# Patient Record
Sex: Male | Born: 1935 | Race: White | Hispanic: No | Marital: Married | Smoking: Never smoker
Health system: Southern US, Community
[De-identification: ages and names within clinical notes are randomized; demographics above are authoritative.]

## PROBLEM LIST (undated history)

## (undated) DIAGNOSIS — L03211 Cellulitis of face: Secondary | ICD-10-CM

## (undated) DIAGNOSIS — Q2112 Patent foramen ovale: Secondary | ICD-10-CM

## (undated) DIAGNOSIS — Z974 Presence of external hearing-aid: Secondary | ICD-10-CM

## (undated) DIAGNOSIS — K449 Diaphragmatic hernia without obstruction or gangrene: Secondary | ICD-10-CM

## (undated) DIAGNOSIS — I639 Cerebral infarction, unspecified: Secondary | ICD-10-CM

## (undated) DIAGNOSIS — K439 Ventral hernia without obstruction or gangrene: Secondary | ICD-10-CM

## (undated) DIAGNOSIS — C801 Malignant (primary) neoplasm, unspecified: Secondary | ICD-10-CM

## (undated) DIAGNOSIS — R3129 Other microscopic hematuria: Secondary | ICD-10-CM

## (undated) DIAGNOSIS — I10 Essential (primary) hypertension: Secondary | ICD-10-CM

## (undated) DIAGNOSIS — N529 Male erectile dysfunction, unspecified: Secondary | ICD-10-CM

## (undated) DIAGNOSIS — Z792 Long term (current) use of antibiotics: Secondary | ICD-10-CM

## (undated) DIAGNOSIS — R972 Elevated prostate specific antigen [PSA]: Secondary | ICD-10-CM

## (undated) DIAGNOSIS — N400 Enlarged prostate without lower urinary tract symptoms: Secondary | ICD-10-CM

## (undated) DIAGNOSIS — N281 Cyst of kidney, acquired: Secondary | ICD-10-CM

## (undated) DIAGNOSIS — I4891 Unspecified atrial fibrillation: Secondary | ICD-10-CM

## (undated) DIAGNOSIS — I69354 Hemiplegia and hemiparesis following cerebral infarction affecting left non-dominant side: Secondary | ICD-10-CM

## (undated) DIAGNOSIS — N139 Obstructive and reflux uropathy, unspecified: Secondary | ICD-10-CM

## (undated) DIAGNOSIS — R269 Unspecified abnormalities of gait and mobility: Secondary | ICD-10-CM

## (undated) DIAGNOSIS — K222 Esophageal obstruction: Secondary | ICD-10-CM

## (undated) DIAGNOSIS — R931 Abnormal findings on diagnostic imaging of heart and coronary circulation: Secondary | ICD-10-CM

## (undated) DIAGNOSIS — K8689 Other specified diseases of pancreas: Secondary | ICD-10-CM

## (undated) DIAGNOSIS — E785 Hyperlipidemia, unspecified: Secondary | ICD-10-CM

## (undated) DIAGNOSIS — Q211 Atrial septal defect: Secondary | ICD-10-CM

## (undated) HISTORY — DX: Atrial septal defect: Q21.1

## (undated) HISTORY — DX: Esophageal obstruction: K22.2

## (undated) HISTORY — DX: Elevated prostate specific antigen (PSA): R97.20

## (undated) HISTORY — DX: Ventral hernia without obstruction or gangrene: K43.9

## (undated) HISTORY — DX: Unspecified abnormalities of gait and mobility: R26.9

## (undated) HISTORY — DX: Cyst of kidney, acquired: N28.1

## (undated) HISTORY — DX: Hyperlipidemia, unspecified: E78.5

## (undated) HISTORY — DX: Presence of external hearing-aid: Z97.4

## (undated) HISTORY — DX: Hemiplegia and hemiparesis following cerebral infarction affecting left non-dominant side: I69.354

## (undated) HISTORY — DX: Other microscopic hematuria: R31.29

## (undated) HISTORY — DX: Other specified diseases of pancreas: K86.89

## (undated) HISTORY — DX: Patent foramen ovale: Q21.12

## (undated) HISTORY — PX: INGUINAL HERNIA REPAIR: SUR1180

## (undated) HISTORY — DX: Diaphragmatic hernia without obstruction or gangrene: K44.9

## (undated) HISTORY — DX: Obstructive and reflux uropathy, unspecified: N13.9

## (undated) HISTORY — DX: Benign prostatic hyperplasia without lower urinary tract symptoms: N40.0

## (undated) HISTORY — DX: Long term (current) use of antibiotics: Z79.2

## (undated) HISTORY — DX: Abnormal findings on diagnostic imaging of heart and coronary circulation: R93.1

## (undated) HISTORY — DX: Essential (primary) hypertension: I10

## (undated) HISTORY — DX: Unspecified atrial fibrillation: I48.91

## (undated) HISTORY — DX: Male erectile dysfunction, unspecified: N52.9

## (undated) HISTORY — DX: Cellulitis of face: L03.211

## (undated) HISTORY — DX: Cerebral infarction, unspecified: I63.9

---
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
## 2003-01-08 ENCOUNTER — Encounter (INDEPENDENT_AMBULATORY_CARE_PROVIDER_SITE_OTHER): Payer: Self-pay | Admitting: Cardiology

## 2003-01-08 ENCOUNTER — Ambulatory Visit (HOSPITAL_COMMUNITY): Admission: RE | Admit: 2003-01-08 | Discharge: 2003-01-08 | Payer: Self-pay | Admitting: Cardiology

## 2003-08-30 ENCOUNTER — Ambulatory Visit (HOSPITAL_COMMUNITY): Admission: RE | Admit: 2003-08-30 | Discharge: 2003-08-30 | Payer: Self-pay | Admitting: Gastroenterology

## 2004-01-28 ENCOUNTER — Encounter (INDEPENDENT_AMBULATORY_CARE_PROVIDER_SITE_OTHER): Payer: Self-pay | Admitting: Cardiology

## 2004-01-28 ENCOUNTER — Ambulatory Visit (HOSPITAL_COMMUNITY): Admission: RE | Admit: 2004-01-28 | Discharge: 2004-01-28 | Payer: Self-pay | Admitting: Cardiology

## 2005-10-26 ENCOUNTER — Emergency Department (HOSPITAL_COMMUNITY): Admission: EM | Admit: 2005-10-26 | Discharge: 2005-10-26 | Payer: Self-pay | Admitting: Family Medicine

## 2006-01-09 DIAGNOSIS — K8689 Other specified diseases of pancreas: Secondary | ICD-10-CM

## 2006-01-09 DIAGNOSIS — N281 Cyst of kidney, acquired: Secondary | ICD-10-CM

## 2006-01-09 HISTORY — DX: Other specified diseases of pancreas: K86.89

## 2006-01-09 HISTORY — DX: Cyst of kidney, acquired: N28.1

## 2006-01-09 HISTORY — PX: MITRAL VALVE REPAIR: SHX2039

## 2006-02-03 ENCOUNTER — Emergency Department (HOSPITAL_COMMUNITY): Admission: EM | Admit: 2006-02-03 | Discharge: 2006-02-03 | Payer: Self-pay | Admitting: Family Medicine

## 2006-02-07 ENCOUNTER — Inpatient Hospital Stay (HOSPITAL_COMMUNITY): Admission: EM | Admit: 2006-02-07 | Discharge: 2006-02-17 | Payer: Self-pay | Admitting: Family Medicine

## 2006-02-08 ENCOUNTER — Encounter (INDEPENDENT_AMBULATORY_CARE_PROVIDER_SITE_OTHER): Payer: Self-pay | Admitting: *Deleted

## 2006-02-09 ENCOUNTER — Encounter: Payer: Self-pay | Admitting: Vascular Surgery

## 2006-02-11 ENCOUNTER — Encounter (INDEPENDENT_AMBULATORY_CARE_PROVIDER_SITE_OTHER): Payer: Self-pay | Admitting: *Deleted

## 2006-03-10 ENCOUNTER — Encounter (HOSPITAL_COMMUNITY): Admission: RE | Admit: 2006-03-10 | Discharge: 2006-06-08 | Payer: Self-pay | Admitting: Cardiology

## 2006-03-13 ENCOUNTER — Emergency Department (HOSPITAL_COMMUNITY): Admission: EM | Admit: 2006-03-13 | Discharge: 2006-03-13 | Payer: Self-pay | Admitting: Emergency Medicine

## 2006-03-21 ENCOUNTER — Encounter
Admission: RE | Admit: 2006-03-21 | Discharge: 2006-03-21 | Payer: Self-pay | Admitting: Thoracic Surgery (Cardiothoracic Vascular Surgery)

## 2006-05-09 ENCOUNTER — Encounter: Admission: RE | Admit: 2006-05-09 | Discharge: 2006-05-09 | Payer: Self-pay | Admitting: Gastroenterology

## 2006-06-09 ENCOUNTER — Encounter (HOSPITAL_COMMUNITY): Admission: RE | Admit: 2006-06-09 | Discharge: 2006-06-15 | Payer: Self-pay | Admitting: Cardiology

## 2006-07-12 DIAGNOSIS — K222 Esophageal obstruction: Secondary | ICD-10-CM

## 2006-07-12 HISTORY — DX: Esophageal obstruction: K22.2

## 2006-08-29 ENCOUNTER — Ambulatory Visit: Payer: Self-pay | Admitting: Thoracic Surgery (Cardiothoracic Vascular Surgery)

## 2007-02-27 ENCOUNTER — Ambulatory Visit: Payer: Self-pay | Admitting: Thoracic Surgery (Cardiothoracic Vascular Surgery)

## 2007-04-07 ENCOUNTER — Encounter: Admission: RE | Admit: 2007-04-07 | Discharge: 2007-04-07 | Payer: Self-pay | Admitting: Gastroenterology

## 2007-04-07 ENCOUNTER — Ambulatory Visit: Payer: Self-pay | Admitting: Family Medicine

## 2007-05-02 ENCOUNTER — Ambulatory Visit: Payer: Self-pay | Admitting: Gastroenterology

## 2007-05-04 ENCOUNTER — Ambulatory Visit (HOSPITAL_COMMUNITY): Admission: RE | Admit: 2007-05-04 | Discharge: 2007-05-04 | Payer: Self-pay | Admitting: Gastroenterology

## 2007-05-04 ENCOUNTER — Encounter: Payer: Self-pay | Admitting: Gastroenterology

## 2007-05-12 ENCOUNTER — Ambulatory Visit: Payer: Self-pay | Admitting: Gastroenterology

## 2007-05-13 DIAGNOSIS — L03211 Cellulitis of face: Secondary | ICD-10-CM

## 2007-05-13 HISTORY — DX: Cellulitis of face: L03.211

## 2007-05-16 ENCOUNTER — Ambulatory Visit: Payer: Self-pay | Admitting: Family Medicine

## 2007-09-13 DIAGNOSIS — K5900 Constipation, unspecified: Secondary | ICD-10-CM | POA: Insufficient documentation

## 2007-09-13 DIAGNOSIS — I517 Cardiomegaly: Secondary | ICD-10-CM

## 2007-09-13 DIAGNOSIS — I493 Ventricular premature depolarization: Secondary | ICD-10-CM | POA: Insufficient documentation

## 2007-09-13 DIAGNOSIS — R011 Cardiac murmur, unspecified: Secondary | ICD-10-CM

## 2007-09-13 DIAGNOSIS — R5383 Other fatigue: Secondary | ICD-10-CM

## 2007-09-13 DIAGNOSIS — K5730 Diverticulosis of large intestine without perforation or abscess without bleeding: Secondary | ICD-10-CM | POA: Insufficient documentation

## 2007-09-13 DIAGNOSIS — R5381 Other malaise: Secondary | ICD-10-CM | POA: Insufficient documentation

## 2007-09-13 DIAGNOSIS — K863 Pseudocyst of pancreas: Secondary | ICD-10-CM

## 2007-09-13 DIAGNOSIS — I10 Essential (primary) hypertension: Secondary | ICD-10-CM | POA: Insufficient documentation

## 2007-09-13 DIAGNOSIS — N400 Enlarged prostate without lower urinary tract symptoms: Secondary | ICD-10-CM

## 2007-09-13 DIAGNOSIS — N281 Cyst of kidney, acquired: Secondary | ICD-10-CM | POA: Insufficient documentation

## 2007-09-13 DIAGNOSIS — K862 Cyst of pancreas: Secondary | ICD-10-CM | POA: Insufficient documentation

## 2007-09-13 DIAGNOSIS — K449 Diaphragmatic hernia without obstruction or gangrene: Secondary | ICD-10-CM | POA: Insufficient documentation

## 2007-12-12 ENCOUNTER — Encounter (INDEPENDENT_AMBULATORY_CARE_PROVIDER_SITE_OTHER): Payer: Self-pay | Admitting: *Deleted

## 2007-12-12 ENCOUNTER — Inpatient Hospital Stay (HOSPITAL_COMMUNITY): Admission: EM | Admit: 2007-12-12 | Discharge: 2007-12-15 | Payer: Self-pay | Admitting: Emergency Medicine

## 2007-12-15 ENCOUNTER — Encounter (INDEPENDENT_AMBULATORY_CARE_PROVIDER_SITE_OTHER): Payer: Self-pay | Admitting: *Deleted

## 2007-12-25 ENCOUNTER — Ambulatory Visit (HOSPITAL_COMMUNITY): Admission: RE | Admit: 2007-12-25 | Discharge: 2007-12-25 | Payer: Self-pay | Admitting: Cardiology

## 2008-05-13 ENCOUNTER — Ambulatory Visit: Payer: Self-pay | Admitting: Family Medicine

## 2009-04-11 ENCOUNTER — Ambulatory Visit: Payer: Self-pay | Admitting: Family Medicine

## 2010-01-02 ENCOUNTER — Ambulatory Visit: Payer: Self-pay | Admitting: Family Medicine

## 2010-02-09 HISTORY — PX: CARDIAC CATHETERIZATION: SHX172

## 2010-02-11 DIAGNOSIS — R931 Abnormal findings on diagnostic imaging of heart and coronary circulation: Secondary | ICD-10-CM

## 2010-02-11 HISTORY — DX: Abnormal findings on diagnostic imaging of heart and coronary circulation: R93.1

## 2010-05-29 ENCOUNTER — Ambulatory Visit: Payer: Self-pay | Admitting: Family Medicine

## 2010-07-12 DIAGNOSIS — N139 Obstructive and reflux uropathy, unspecified: Secondary | ICD-10-CM

## 2010-07-12 DIAGNOSIS — N400 Enlarged prostate without lower urinary tract symptoms: Secondary | ICD-10-CM

## 2010-07-12 DIAGNOSIS — R972 Elevated prostate specific antigen [PSA]: Secondary | ICD-10-CM

## 2010-07-12 HISTORY — DX: Benign prostatic hyperplasia without lower urinary tract symptoms: N40.0

## 2010-07-12 HISTORY — DX: Elevated prostate specific antigen (PSA): R97.20

## 2010-07-12 HISTORY — DX: Obstructive and reflux uropathy, unspecified: N13.9

## 2010-07-22 ENCOUNTER — Inpatient Hospital Stay: Admission: RE | Admit: 2010-07-22 | Payer: Self-pay | Source: Home / Self Care | Admitting: General Surgery

## 2010-11-24 NOTE — Consult Note (Signed)
Jack, [name] NO.:  [id number]   MEDICAL RECORD NO.:  [id number]          PATIENT TYPE:  INP   LOCATION:  3035                         FACILITY:  [facility]   PHYSICIAN:  Levert Feinstein, MD          DATE OF BIRTH:  07-29-1933   DATE OF CONSULTATION:  12/13/2007  DATE OF DISCHARGE:                                 CONSULTATION   CHIEF COMPLAINT:  Acute stroke.   HISTORY OF PRESENT ILLNESS:  The patient is a 77 year old gentleman with  previous history of mitral valve prolapse with mitral valve  regurgitation who has underwent mitral valve replacement and also  tricuspid valve annuloplasty, closure of the patent foramen ovale, and  Maze procedure on August 2007.  He has also had a past medical history  of hypertension. He was treated with coumadin 6 months postsurgically,  but aspirin afterwards.  He went on a hiking trip with his visiting  grandchildren December 11, 2007, and around 5 o'clock, while drinking soda,  he developed choking episode, followed by slurred speech, but he denies  left upper extremity weakness.  Also, he was found to have elevated  systolic blood pressure at 180s, which has prompted ER visit.  MRI  showed acute infarction involving the right frontal motor strip.  Since  admission, his symptom has much improved.  The family also said it is  almost back to normal.   REVIEW OF SYSTEMS:  He denies vision change, headache, chest pain,  shortness of breath, and heart palpitation.   PAST MEDICAL HISTORY:  Hypertension, paroxysmal atrial fibrillation,  mitral valve regurgitation, tricuspid valve regurgitation, patent  foramen ovale, status post closure; intermitten A-fib.   PAST SURGICAL HISTORY:  Mitral valve repair, tricuspid valve  annuloplasty, and patent foramen ovale closure, Maze procedure.   CURRENT MEDICATIONS:  1. Aspirin 325 mg every day.  2. Lovenox subcu as DVT prophylaxis.   FAMILY HISTORY:  Noncontributory.   SOCIAL HISTORY:  Denies  smoke or drink.  He lives with his family.   ALLERGIES:  No known drug allergy.   PHYSICAL EXAMINATION:  VITAL SIGNS:  Temperature 97.8, blood pressure  130/70, and heart rate of 78.  CARDIAC:  Regular rate rhythm.  PULMONARY:  Clear to auscultation bilaterally.  NECK:  Supple.  No carotid bruits.  NEUROLOGIC:  Mental status, alert, oriented to history, casual  conversation.  There was mild hesitation in his speech, but there was no  aphasia, not slurred.  Cranial nerves II through XII: pupil equal, round, reactive to light and  accommodation.  Visual field were full on confrontational test.  Fundi  was sharp bilaterally.  facial sensation was normal.  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  Uvula and tongue are  midline.  Shoulder shrug and head turn normal, symmetric, and he is hard  of hearing.  Motor examination:  Normal tone and bulk.  There was mild fixation of  the left upper extremity on rapid movement and there was also mild  weakness.  Left hand grip 5-/5.  Sensory  examination was normal to light touch pin prick.  Deep tendon  reflex was normal symmetric.  Plantar responses were flexor.  Gait,  narrow based and steady.  Coordination:  Normal finger-to-nose, heel-to-  shin.   MRI of the brain, there was acute stroke on the right MCA motor strip on  the DWI.  MRA, there was mild attenuation on the right MCA, M2 and M3  distribution.   ASSESSMENT/PLAN:  A 77 year old with past medical history of mitral  valve repair, tricuspid valve annuloplasty, closure of patent foramen  ovale, and Maze procedure presenting with acute slurred speech and mild  dysphasia.  MRI confirmed right MCA motor strip acute stroke.  Etiology  might suggest a small embolic versus atherosclerotic disease.  With  normal TTE, I would suggest proceed with TEE to further evaulate, in  light of his cardiac history.  If there is no evidence of embolic  resoures, he should continue to  benefit antiplatelet agent, will switch  him to Plavix 75 mg.  If TEE has findings suggestive of embolic  resoures, he will then benefit long term anticoagulation with coumadin  with INR 2-3.  Please continue to address risk factors.  Blood pressure  needs to be less than or equal to 130/80, LDL less than 100.      Levert Feinstein, MD  Electronically Signed     YY/MEDQ  D:  12/13/2007  T:  12/14/2007  Job:  [id number]

## 2010-11-24 NOTE — Assessment & Plan Note (Signed)
[facility]                         GASTROENTEROLOGY OFFICE NOTE   LEON, MONTOYA                        MRN:          [id number]  DATE:05/02/2007                            DOB:          [birth year]    REFERRING PHYSICIAN:  Bernette Redbird, M.D.   PRIMARY CARE PHYSICIAN:  Dr. Sharlot Gowda.   REASON FOR REFERRAL:  Dr. Matthias Hughs asked me to evaluate Mr. Zaman in  consultation regarding pancreatic cysts.   HISTORY OF PRESENT ILLNESS:  Mr. Luckey is a very pleasant 77 year old  man who was initially noted to have a 2 cm cystic lesion in his  pancreatic neck in June of 2007.  This was done as part of a workup for  microhematuria.  He had CA 19-9 done around that time and the result was  37, which is 2 ticks above normal.  He had followup CT scans twice.  The  most recent was 1 month ago and it showed that this lesion was  essentially stable measuring 1.9 cm.  Repeat CA 19-9 done last month  showed that it is slightly elevated now to 59.   He has no history of anything that sounds like acute pancreatitis.  He  has never bene severely injured in his abdomen.  Never was a major  alcohol drinker and pancreatic diseases do not run in his family.   REVIEW OF SYSTEMS:  Notable for stable weight.  Otherwise, essentially  normal and is available on his nursing intake sheet.   PAST MEDICAL HISTORY:  Hypertension, status post heart valve surgery,  status post hernia surgery.  This is a mitral and tricuspid  valvuloplasty.   CURRENT MEDICATIONS:  Diovan.  Baby aspirin.   ALLERGIES:  No known drug allergies.   SOCIAL HISTORY:  Married with 3 sons.  Nonsmoker.  Nondrinker.   FAMILY HISTORY:  No colon cancer, colon polyps, or pancreatic cancer in  family.   PHYSICAL EXAM:  Height 5 feet 4 inches, 139 pounds, blood pressure  122/72, pulse 80.  CONSTITUTIONAL:  Generally well-appearing.  NEUROLOGIC:  Alert and oriented x3.  EYES:  Extraocular movements  intact.  MOUTH:  Oropharynx moist, no lesions.  NECK:  Supple.  No lymphadenopathy.  CARDIOVASCULAR:  Heart is regular rate and rhythm.  LUNGS:  Clear to auscultation bilaterally.  ABDOMEN:  Soft, nontender, and nondistended.  Normal bowel sounds.  EXTREMITIES:  No lower extremity edema.  SKIN:  No rash or lesions on the visible extremities.   ASSESSMENT AND PLAN:  A 77 year old man with incidental pancreatic cyst  in the neck of his pancreas that has shown no significant growth in  about a year-and-a-half on serial imaging.   It is highly unlikely that this cyst represents pancreatic cancer.  At  worst, it may be a pre-cancerous condition, such as a mucinous cyst in  his pancreas.  More likely, given its imaging characteristics, it is a  serous cyst which is thought to be benign and have no malignant  potential.  He does have slightly elevated CA 19-9, but this is very  nonspecific.  I offered  followup CT scan in 6 months as 1 option.  I did  recommend that the best option would be to proceed with endoscopic  ultrasound with fine needle aspiration.  Cyst fluid analysis can risk-  stratify the cyst a bit more to potentially being a premalignant  condition.  If it is found to be a premalignant cyst, I think he would  need to be arranged to see an experienced pancreatic surgeon to discuss  options at that point.  If, however, the cyst has serous characteristics  on analysis, then perhaps 1 more CT scan in about a year and if there is  still no change at that point, I think foregoing any further followup is  completely reasonable.  Mr. Kagel and his wife agreed that they want to  proceed with endoscopic ultrasound with fine needle aspiration.  We  will, therefore, arrange for that to be done at his soonest convenience.     Rachael Fee, MD  Electronically Signed    DPJ/MedQ  DD: 05/02/2007  DT: 05/03/2007  Job #: [id number]   cc:   Bernette Redbird, M.D.  Sharlot Gowda, M.D.

## 2010-11-24 NOTE — Assessment & Plan Note (Signed)
OFFICE VISIT   Jack [name], Jack [name]  DOB:  May 16, 1934                                        February 27, 2007  CHART #:  [id number]   HISTORY OF PRESENT ILLNESS:  The patient returns for routine followup  now more than one year status post mitral valve repair, tricuspid valve  annuloplasty, closure of patent foramen ovale and a MAZE procedure on  February 11, 2006. He was last seen in the office on August 29, 2006.  Since then, he has continued to do quite well. He denies any exertional  shortness of breath or chest discomfort. He denies any lower extremity  edema. He has not had any palpitations or dizzy spells. He continues to  see Dr.  Donnie Aho on a regular basis and he has now been taken off of  Coumadin for several months. Overall, he feels well.   REVIEW OF SYSTEMS:  Is entirely uneventful.   PAST MEDICAL HISTORY:  Is unchanged from previously.   CURRENT MEDICATIONS:  1. Diovan/hydrochlorothiazide one tablet daily.  2. Aspirin 81 mg daily.   PHYSICAL EXAMINATION:  Notable for a well-appearing male. Blood pressure  133/73, pulse 78 and regular. Two channel telemetry rhythm strip  demonstrates normal sinus rhythm.  HEENT: Examination is unrevealing.  Auscultation of the chest demonstrates clear breath sounds which are  symmetrical bilaterally.  CARDIOVASCULAR: Regular rate and rhythm. No murmurs, rubs or gallops are  noted.  ABDOMEN: Soft and nontender.  EXTREMITIES: Are warm and well perfused. There is no lower extremity  edema.  The remainder of his physical examination is noncontributory.   IMPRESSION:  The patient appears to be doing quite well now more than  one year status post mitral valve repair, tricuspid annuloplasty,  closure of patent foramen ovale and a MAZE procedure. He appears to be  maintaining sinus rhythm. He has been taken off of Coumadin and he  continues to do quite well.   PLAN:  We will ask the patient to return for followup in  one year to see  how he is doing and check his heart rhythm. He will continue to followup  closely with Dr.  Donnie Aho for longterm attention to his basic medical  needs.   Salvatore Decent. Cornelius Moras, M.D.  Electronically Signed   CHO/MEDQ  D:  02/27/2007  T:  02/27/2007  Job:  [id number]   cc:   Georga Hacking, M.D.

## 2010-11-24 NOTE — H&P (Signed)
NAME:  Jack [name], Jack [name] NO.:  [id number]   MEDICAL RECORD NO.:  [id number]          PATIENT TYPE:  EMS   LOCATION:  MAJO                         FACILITY:  [facility]   PHYSICIAN:  Mobolaji B. Bakare, M.D.DATE OF BIRTH:  22-Nov-1933   DATE OF ADMISSION:  12/11/2007  DATE OF DISCHARGE:                              HISTORY & PHYSICAL   PRIMARY CARE PHYSICIAN:  Sharlot Gowda, M.D.   CARDIOLOGIST:  Georga Hacking, M.D.   CHIEF COMPLAINT:  Slurred speech about 50 p.m. yesterday.   HISTORY OF PRESENTING COMPLAINT:  Mr. Ikard is a 77 year old Caucasian  male with history of hypertension, transient atrial fibrillation in July  2007, mitral valve repair, tricuspid valve annuloplasty, closure of  patent foramen ovale and maze procedure procedure in August 2007.   He was in his usual state of health until yesterday.  The patient went  hiking with his visiting grandchildren.  They went to the mountains.  The patient hiked for several miles.  About 5 o'clock when he was  resting, he developed slurred speech but no shortness of breath.  He  decided to head home.  They went for dinner upon arrival in [place].  The patient had his dinner without choking.  He got home about 10 p.m.  He checked his blood pressure.  It was elevated with a systolic of 180.  He subsequently developed facial asymmetry.  Hence, the patient's family  called EMS and the patient was brought to the emergency room.  He was  seen at 2320 hours, more than 6 hours after onset of symptoms.  The  patient was not a candidate for t-PA.  He had a CT scan of the head  which showed probable subacute nonhemorrhagic stroke in the left  posterior cerebral artery distribution.  His EKG showed sinus rhythm  with first-degree AV block and PVCs.  The patient is awake and able to  give history.  The patient has no changes in his vision.   REVIEW OF SYSTEMS:  He denies cough, chills, shortness of breath,  orthopnea or PND.   No palpitation.  No nausea, vomiting, diarrhea or  chest pain.  No abdominal pain, constipation.  He denies dysuria or  urgency or increased frequency of micturition.   PAST MEDICAL HISTORY:  As listed in the HPI:   1. Hypertension.  2. Paroxysmal atrial fibrillation.  3. Mitral valve regurgitation, status post repair in August 2007.  4. Tricuspid valve regurgitation, status post annuloplasty.  5. Patent foramen ovale, status post closure and maze procedure.   CURRENT MEDICATIONS:  1. Diovan/hydrochlorothiazide 1 tablet daily.  2. Aspirin 81 mg daily.  3. Viagra.   ALLERGIES:  No known drug allergies.   FAMILY HISTORY:  Positive for heart disease and colon cancer.   SOCIAL HISTORY:  The patient does not smoke cigarettes nor drink  alcohol.  He is retired from [employer].   EXAMINATION:  INITIAL VITALS:  Temperature 96.3, blood pressure 168/75.  Current blood pressure rechecked was 123/66, pulse of 74-80, respiratory  rate of 18, O2 saturation of 99%.  The patient is awake, alert, oriented in time, place and person.  Normocephalic, atraumatic head.  Pupils equal, round and reactive to  light.  Extraocular muscle movement intact.  He has facial asymmetry  with subtle left facial droop.  No elevated JVD.  No carotid bruit.  LUNGS:  Clear clinically to auscultation except for minimal bibasilar  rales.  CVS:  S1, S2.  Regular.  No murmur.  ABDOMEN:  Not distended, soft, nontender.  Bowel sounds present.  EXTREMITIES:  No pedal edema or calf tenderness.  Dorsalis pedis pulses  palpable bilaterally.  There is no distal cyanosis.  CNS:  Pupils are equal, round and reactive to light.  Extraocular muscle  movement intact.  Uvula is central.  Tongue is deviating to the left,  and he has slight facial asymmetry with left facial droop.  Muscle power  5/5 in all limbs.  Deep tendon reflexes equal and symmetrical.  Sensation is intact.  Plantar reflex upgoing on the left and  downgoing  on the right.   INITIAL LABORATORY DATA:  White cells 7.1, hemoglobin 12.8, hematocrit  37.4, platelets 287.  Sodium 137, potassium 3.4, chloride 99, glucose  99, BUN 16, creatinine 1.2.  Head CT scan showed probable subacute  nonhemorrhagic stroke in the left posterior cerebral artery  distribution, mild chronic small-vessel ischemic changes, no acute  intracranial abnormality.  EKG showed sinus rhythm with first-degree AV  block with occasional premature ventricular complexes.   ASSESSMENT AND PLAN:  1. Mr. Musick is a 77 year old Caucasian male with past medical      history as detailed above.  He is presenting with slurred speech      and facial asymmetry, which symptoms started about 5 p.m.      yesterday.  He is not a candidate for t-PA given the duration of      symptoms.  CT scan of the head showed subacute stroke in the left      posterior cerebral artery distribution.  The patient will be      admitted for further evaluation and treatment.  We will admit to      telemetry floor.  We will do swallowing screen.  Meanwhile, he will      be given aspirin 300 mg rectally.  We will initiate stroke workup      to include carotid Dopplers, 2-D echocardiogram, fasting lipid      profile, MRI and MRA of the head and neck, homocysteine level,      hemoglobin A1c, transcranial Dopplers.  We will hold all      antihypertensives for now given his current blood pressure, PT, OT      and speech evaluation.  We will consult neurology in the morning.  2. History of paroxysmal atrial fibrillation.  The patient is      currently in normal sinus rhythm.  3. Hypokalemia.  We will replete with potassium supplements in IV      fluid.      Mobolaji B. Corky Downs, M.D.  Electronically Signed    MBB/MEDQ  D:  12/12/2007  T:  12/12/2007  Job:  [id number]   cc:   Sharlot Gowda, M.D.  Georga Hacking, M.D.

## 2010-11-27 NOTE — H&P (Signed)
NAMEBAXTER, GONZALEZ NO.:  [id number]   MEDICAL RECORD NO.:  [id number]          PATIENT TYPE:  INP   LOCATION:  1828                         FACILITY:  [facility]   PHYSICIAN:  Elmore Guise., M.D.DATE OF BIRTH:  21-Nov-1933   DATE OF ADMISSION:  02/07/2006  DATE OF DISCHARGE:                                HISTORY & PHYSICAL   INDICATION FOR ADMISSION:  New-onset atrial fibrillation.   PRIMARY CARDIOLOGIST:  Dr. Viann Fish.   HISTORY OF PRESENT ILLNESS:  The patient is very pleasant 77 year old white  male with past medical history of hypertension and mitral valve disease  (history of moderate to severe mitral regurgitation with partially flail  anterior lateral mitral valve leaflet) who presented with 1-week history of  increased dyspnea on exertion, malaise, and decreased exertional tolerance.  The patient denies any chest pain or palpitation.  He went to a local [facility], there was found to be volume overloaded in new-onset atrial  fibrillation with rate in the 140-150 range.  He was then sent to [facility]  hospital for further evaluation and treatment.  The patient reports mild  swelling and orthopnea over the last week.  Otherwise states I have been  doing okay.  He denies any fever, chills, nausea, vomiting or diarrhea.  No  presyncope, syncope and no palpitations.   CURRENT MEDICATIONS:  1.  Diovan HCTZ 160/12.5 mg daily.  2.  Aspirin once daily.  3.  Norvasc 5 mg daily.   ALLERGIES:  None.   FAMILY HISTORY:  Positive for heart disease and colon cancer.   SOCIAL HISTORY:  He is married.  No tobacco or alcohol.  Has three grown  children.  He is retired from [employer].   EXAM:  He is afebrile.  Blood pressure is 121/80, heart rate 100-110, in  atrial fibrillation, respiratory rate is 16, sat 95%.  GENERAL:  He is a very pleasant white male alert and oriented x4 in no acute  distress.  He does have mild JVD to the angle of the jaw sitting  at 30  degrees angle.  He has no bruits.  LUNGS:  Have bibasilar crackles.  HEART:  Irregular regular with 3/6 holosystolic murmur noted.  ABDOMEN:  Soft, nontender, mild distension with tympanitic sounds in the  upper abdomen.  EXTREMITIES:  Showed trace pretibial edema.   LAB WORK:  Shows normal coags with an INR of 1.3 and PTT of 32, BNP is 575,  BUN is 15, creatinine is pending at the time of dictation.  Potassium 3.8,  hemoglobin 7.41, white count of 7.7 and platelet count 264.  His myoglobin  is 60 and MB of 1.5, troponin I less than 0.05.  ECG shows atrial  fibrillation with rapid ventricular response of 141 beats per minute no  significant ST or T-wave changes.  Chest x-ray showed mild volume overload.  His echo was reviewed from 01/17/2006 showed an EF of 70% with moderate  severe mitral regurgitation and near flail anterior mitral valve leaflet.  His left atrial dimension was 50 mm.   IMPRESSION:  1.  New-onset atrial fibrillation.  2.  History of moderate to severe mitral regurgitation.   PLAN:  1.  The patient will be admitted to telemetry.  Will start amiodarone load,      continue Lovenox 1 mg/kg subcu b.i.d. and start Coumadin.  2.  We will schedule for TEE cardioversion in the morning.  Will also check      TSH, serial cardiac enzymes and follow electrolytes.  I did discuss with      him with his amount of mitral valve disease, recurrence of his atrial      fibrillation may happen.  We also discussed that should he started      having further problems with his valvular heart disease, repair or      replacement would be indicated.  We will further look at his valve      during his TEE procedure.      Elmore Guise., M.D.  Electronically Signed     TWK/MEDQ  D:  02/07/2006  T:  02/07/2006  Job:  [id number]   cc:   Georga Hacking, M.D.

## 2010-11-27 NOTE — Consult Note (Signed)
Jack [name], Jack [name] NO.:  [id number]   MEDICAL RECORD NO.:  [id number]          PATIENT TYPE:  INP   LOCATION:  3705                         FACILITY:  [facility]   PHYSICIAN:  Salvatore Decent. Cornelius Moras, M.D. DATE OF BIRTH:  09-28-1933   DATE OF CONSULTATION:  DATE OF DISCHARGE:                                   CONSULTATION   DATE OF CONSULTATION:  February 09, 2006   REQUESTING PHYSICIAN:  Rosine Abe, M.D.   PRIMARY CARDIOLOGIST:  Georga Hacking, M.D.   REASON FOR CONSULTATION:  Severe mitral regurgitation, tricuspid  regurgitation, atrial fibrillation.   HISTORY OF PRESENT ILLNESS:  Jack [name] is a 77 year old white male from  [place] with history of hypertension and mitral valve prolapse with  mitral regurgitation who has been followed for several years by Dr. Donnie Aho.  The patient now presents with a 1-2 week history of symptoms of congestive  heart failure.  He initially presented to a local urgent care center where  he was noted to be in volume overload with new onset atrial fibrillation.  He was transferred to [facility].  Since hospitalization,  the patient has improved with medical therapy.  He converted to normal sinus  rhythm on amiodarone.  Transesophageal echocardiogram was performed  confirming the presence of mitral valve prolapse with severe mitral  regurgitation and moderate to severe tricuspid regurgitation.  Left  ventricular function appears normal.  The patient underwent left and right  heart catheterization today by Dr. Reyes Ivan.  Findings were notable for the  presence of insignificant coronary artery disease and normal left  ventricular systolic function.  The patient was noted to have severe mitral  regurgitation with moderate to severe pulmonary hypertension.  Cardiac  surgical consultation has been requested to consider surgical intervention.   REVIEW OF SYSTEMS:  GENERAL:  The patient reports normal appetite.  He  admits to worsening exertional fatigue.  CARDIAC:  Notable for a 2-week  history of worsening symptoms of shortness of breath, initially with  exertion, and more recently prior to admission, at rest.  The patient had  developed paroxysmal nocturnal dyspnea, as well as orthopnea and bilateral  lower extremity edema prior to hospital admission.  The patient denies any  tachy palpitations.  He denies any symptoms associated with irregular heart  rhythm other than shortness of breath.  He denies any chest pain or chest  tightness either with activity or at rest.  RESPIRATORY:  Notable for  shortness of breath.  The patient denies productive cough, hemoptysis,  wheezing.  GASTROINTESTINAL:  Notable for some constipation, which  apparently has been relieved recently.  The patient has no difficulty  swallowing.  He denies hematochezia, hematemesis, melena.  MUSCULOSKELETAL:  Negative.  The patient denies problems with arthritis or arthralgias.  NEUROLOGIC:  Negative.  The patient denies symptoms suggestive of previous  TIA or stroke.  GENITOURINARY:  Negative.  The patient has recently been  evaluated by Dr. Etta Grandchild for known benign prostatic hypertrophy.  HEENT:  Negative.  The patient recently saw his dentist and sees his  dentist on a  regular basis.  He typically gets routine dental prophylaxis before  procedures, although he forgot to prior to his last dental cleaning.  PSYCHIATRIC:  Negative.   PAST MEDICAL HISTORY:  1.  Mitral valve prolapse with severe mitral regurgitation.  2.  Hypertension.  3.  Benign prostatic hypertrophy.   PAST SURGICAL HISTORY:  Bilateral inguinal hernia repair.   FAMILY HISTORY:  Noncontributory.   SOCIAL HISTORY:  The patient is married and lives with his wife here in  [place].  He is retired from [employer].  He remains  reasonably active physically.  He is a nonsmoker and denies significant  alcohol consumption.   MEDICATIONS PRIOR TO  ADMISSION:  1.  Diovan/hydrochlorothiazide 160/12.5 one tablet daily.  2.  Aspirin 325 mg daily.  3.  Norvasc 5 mg daily.   DRUG ALLERGIES:  NONE KNOWN.   PHYSICAL EXAMINATION:  GENERAL:  The patient is a well-appearing male who  appears of stated age in no acute distress.  He is currently in normal sinus  rhythm.  He is afebrile and normotensive.  HEENT:  Grossly unremarkable.  NECK:  Supple.  There is no cervical or supraclavicular lymphadenopathy.  There is no jugular venous distention.  No carotid bruits are noted.  CHEST:  Auscultation of the chest demonstrates clear breath sounds  bilaterally with a few bibasilar inspiratory crackles.  No wheezes or  rhonchi are noted.  CARDIOVASCULAR:  Notable for regular rate and rhythm.  There is a prominent  grade 4/6 holosystolic murmur heard best at the apex with radiation across  the precordium into the axilla and back.  No diastolic murmurs are noted.  ABDOMEN:  Mildly obese and distended.  There is no obvious ascites.  The  liver edge is not palpable.  Bowel sounds are present.  There are no  palpable masses.  EXTREMITIES:  Warm and well perfused.  There is no lower extremity edema.  Distal pulses are palpable in both lower legs in the posterior tibial  position.  There is no sign of significant venous insufficiency.  The skin  is clean, dry, and healthy appearing throughout.  RECTAL/GU:  Both deferred.  NEUROLOGICAL:  Grossly nonfocal.   DIAGNOSTIC TESTS:  Transesophageal echocardiogram performed by Dr. Reyes Ivan on  February 08, 2006, is reviewed.  This demonstrates mitral valve prolapse with  severe (4+) mitral regurgitation.  Specifically, there is prolapse of the  anterior leaflet of the mitral valve which is somewhat eccentrically  located.  It appears to be near the A1 portion of the anterior leaflet with  a jet of severe mitral regurgitation directed posteriorly and laterally around the left atrium.  The valve has a typical  myxomatous appearance,  although in some views the posterior leaflet appears relatively small.  The  aortic valve appears normal.  Left ventricular size and function appears  normal.  The left atrium is enlarged.  There is moderate to severe tricuspid  regurgitation.   Cardiac catheterization performed today by Dr. Reyes Ivan is reviewed.  This  demonstrates normal coronary artery anatomy with no significant coronary  artery atherosclerotic lesions with some luminal irregularities in a variety  of locations.  Left ventricular function appears normal.  There is moderate  to severe pulmonary hypertension with PA pressures measuring 65/31 with a  pulmonary capillary wedge pressure of 39 and a CVP of 26 mmHg.   IMPRESSION:  Mitral valve prolapse with severe mitral regurgitation,  moderate to severe tricuspid regurgitation, new onset  atrial fibrillation  and symptoms of congestive heart failure.  I believe that Mr. Mcclintock would  best be treated by mitral valve repair, tricuspid valve repair, Maze  procedure, and closure of the small patent foramen ovale identified at the  time of transesophageal echocardiogram.   PLAN:  I have outlined the options at length with Mr. Poke and his wife  here this afternoon.  Alternative treatment strategies have been discussed.  In particular, continued observation with medical therapy versus proceeding  with surgery at this juncture had been reviewed.  A variety of surgical  alternatives have been discussed.  They understand that there is a  likelihood that his mitral valve will be repairable, although there is also  a possibility that adequate repair will not be technically feasible and that  valve replacement would be necessary.  If this would to occur, we would need  to consider whether or not to replace his mitral valve with a bioprosthetic  tissue valve or a mechanical prosthesis.  The relative risks and benefits of  each of these options have been  discussed.  They understand and accept all  associated risks of surgery, including, but not limited to, risks of death,  stroke, myocardial infarction, congestive heart failure, respiratory  failure, pneumonia, bleeding requiring blood transfusions, arrhythmia, heart  block, or bradycardia requiring permanent pacemaker, late complications  related to valve repair or replacement, or anticoagulation with Coumadin.  They understand the rationale for concomitant Maze procedure at the time of  surgery in the effort to diminish the likelihood of atrial arrhythmias in  the long term future.  All of their questions have been addressed.  We  tentatively plan to proceed with surgery on Friday, February 11, 2006.      Salvatore Decent. Cornelius Moras, M.D.  Electronically Signed     CHO/MEDQ  D:  02/09/2006  T:  02/09/2006  Job:  [id number]   cc:   Elmore Guise., M.D.  Georga Hacking, M.D.

## 2010-11-27 NOTE — Discharge Summary (Signed)
Jack [name], Jack [name] NO.:  [id number]   MEDICAL RECORD NO.:  [id number]          PATIENT TYPE:  INP   LOCATION:  2033                         FACILITY:  [facility]   PHYSICIAN:  Salvatore Decent. Cornelius Moras, M.D. DATE OF BIRTH:  1933/10/01   DATE OF ADMISSION:  02/07/2006  DATE OF DISCHARGE:                               DISCHARGE SUMMARY   PRIMARY DIAGNOSIS:  Mitral valve prolapse with severe mitral  regurgitation, tricuspid regurgitation, paroxysmal atrial fibrillation,  patent foramen ovale.   IN-HOSPITAL DIAGNOSES:  1. Acute renal insufficiency postoperatively.  2. Acute blood loss anemia postoperatively.  3. Postoperative volume overload.   SECONDARY DIAGNOSES:  1. Mitral valve prolapse with severe mitral regurgitation.  2. Hypertension.  3. Benign prostatic hypertrophy.  4. Status post bilateral inguinal hernia repair.   ALLERGIES:  No known drug allergies.   IN-HOSPITAL OPERATIONS AND PROCEDURES:  1. Transesophageal echocardiogram.  2. Cardiac catheterization with right and left heart catheterization.  3. Median sternotomy for mitral valve repair (chordal transposition x2      with 28 mm Edwards physio ring annuloplasty), tricuspid valve      repair using a 30-mm ring annuloplasty, closure of patent foramen      ovale, modified Cox-Maze IV procedure.  4. Intraoperative transesophageal echocardiogram.   HISTORY AND PHYSICAL AND THE HOSPITAL COURSE:  The patient is a 77-year-  old Caucasian male from [place] with history of hypertension and  mitral valve prolapse with mitral regurgitation.  The patient has been  followed for several years by Dr. Donnie Aho.  The patient now presents with  a 1 to 2 week history of symptoms of congestive heart failure.  He  initially presented to a local urgent care center where he was noted to  be in volume overload with new onset atrial fibrillation.  The patient  was then transferred to [facility].  This  hospitalization, the patient has improved with medical therapy.  He  converted to normal sinus rhythm on amiodarone.  Transesophageal  echocardiogram was done confirming the presence of mitral prolapse with  severe mitral regurgitation and moderate to severe tricuspid  regurgitation.  Left ventricular function appeared to be normal.  The  patient then underwent left and right heart catheterization by Dr.  Reyes Ivan.  Findings were notable for the presence of insignificant  coronary artery disease and normal left ventricular systolic function.  The patient was noted to have severe mitral regurgitation and moderate  to severe pulmonary hypertension.  Following catheterization, Dr. Cornelius Moras  was consulted.  Dr. Cornelius Moras saw and evaluated the patient.  He discussed  risks and benefits with the patient of undergoing surgery.  The patient  acknowledged understanding and agreed to proceed.  Surgery was scheduled  for August3,2007.  Please see dictated history and physical for further  history and physical exam.   The patient underwent preoperative carotid duplex ultrasound which  showed no significant ICA stenosis bilaterally.   HOSPITAL COURSE:  The patient was taken to the operating room on  August3,2007 where he underwent median sternotomy for mitral valve  repair with chordal  transposition x2 using a 28-mm Edwards physio ring  annuloplasty, tricuspid valve repair using 30-mm ring annuloplasty,  closure of patent foramen ovale, modified Cox-Maze IV procedure.  Postoperative transesophageal echocardiogram was done following surgery  and showed no residual MR and showed mild TR.  The patient tolerated the  procedure well and was transferred up to the intensive care unit in  stable condition.  Following surgery, the patient was seen to be  hemodynamically stable.  He was extubated late evening following  surgery.  Following extubation, the patient was seen to be alert and  oriented x3, neuro intact.  The  patient's postoperative course was  pretty much unremarkable.  Postoperative day #1, the patient was seen to  be in normal sinus rhythm.  He was continued on the amiodarone p.o.  The  patient was started on Coumadin postoperative day #2.  He remained in  normal sinus rhythm during the remainder of hi hospital course.  The  patient did develop volume overload.  He received several doses of  diuretics.  Due to the diuretics, the patient developed acute renal  insufficiency.  Volume overload increased, and diuretics were decreased.  Creatinine was monitored.  Following decrease in the diuretics, the  patient's creatinine started to trend down back to baseline.  Last  creatinine obtained was postoperative day #4  and was 1.5.  The patient  was back near his baseline prior to discharge home.  He was at 65.9 kg  preoperatively and was weighing 66.7 kg on postoperative day #4.  The  patient also developed acute blood loss anemia postoperatively.  Postoperative day #1, hemoglobin/hematocrit were found to be 9.3 and  27.9.  The patient was asymptomatic from  blood loss.  Hemoglobin/hematocrit were monitored and seen to be stable.  Last  hemoglobin and hematocrit obtained was done postoperative day #4 and was  9.0 and 26.1.  The patient's chest tubes and lines were discontinued in  the normal fashion.  He was transferred out to 2000 on postoperative day  #3.  The patient was afebrile during his postoperative course.  Vital  signs were monitored and seen to be stable.  Due to the patient's blood  pressure being borderline, he was held on starting beta blocker as well  as restarting him back on his calcium channel blocker and ARB.  Will  continue to monitor the patient's blood pressure and heart rate and if  stable may start beta blocker in the future.  The patient was able to be  weaned off oxygen, saturating greater than 90% on room air.  As  mentioned above, the patient was started on low-dose  Coumadin.  Daily  PT/INRs were obtained.  Coumadin was adjusted appropriately.  Last INR  was postoperative day #4 and was 1.3.  Will continue Coumadin dosing  until the patient's INR is therapeutic.  Will obtain PT/INR levels prior  to discharge home.  Coumadin dose will be based on the patient's  discharge INR level.  The patient's incisions were clean, dry and intact  and healing well.  He was out of bed ambulating well.  Diet within  normal limits.  Postoperative chest x-rays were stable, showing no  pneumothorax or pneumonia noted.   The patient is tentatively ready for discharge home in the next 1-2 days  pending his INR level.  Follow-up appointment has been arranged with Dr.  Cornelius Moras for September10,2007 at 11:30 a.m.  The patient will need to obtain  a PA and lateral chest  x-ray one hour prior to this appointment.  The  patient is to follow up with Dr. Donnie Aho in 2 weeks.  He is to call to  schedule appointment with Dr. Donnie Aho.   ACTIVITY:  The patient was told no driving until released to do so, no  lifting over 10 pounds.  He is told to ambulate 3 to 4 times per day,  progress as tolerated and continue breathing exercises.   INCISIONAL CARE:  The patient is told to shower, washing his incisions  using soap and water.  He is to contact the office if he develops any  drainage or opening from any of his incision sites.   DIET:  The patient was educated on diet to be low fat, low salt.   SPECIAL INSTRUCTIONS:  The patient is to obtain a PT/INR blood work 2  days post discharge from hospital at Dr. York Spaniel office.  He will need  to contact Dr. York Spaniel office to schedule this appointment.   DISCHARGE MEDICATIONS:  1. Aspirin 81 mg daily.  2. Amiodarone 200 mg t.i.d.  3. Lasix 40 mg daily for 5 days.  4. Potassium chloride 20 mEq daily for 5 days.  5. Coumadin daily at night.  Dose will be based on patient's discharge      INR level.  6. Oxycodone 5 mg 1 to 2 tabs q.4-6 h.  p.r.n. pain.      Sol Blazing, PA    ______________________________  Salvatore Decent. Cornelius Moras, M.D.    KMD/MEDQ  D:  02/15/2006  T:  02/15/2006  Job:  [id number]   cc:   Georga Hacking, M.D.

## 2010-11-27 NOTE — Discharge Summary (Signed)
Jack [name], Jack [name] NO.:  [id number]   MEDICAL RECORD NO.:  [id number]          PATIENT TYPE:  INP   LOCATION:  2033                         FACILITY:  [facility]   PHYSICIAN:  Salvatore Decent. Cornelius Moras, M.D. DATE OF BIRTH:  [birth year]   DATE OF ADMISSION:  02/07/2006  DATE OF DISCHARGE:                                 DISCHARGE SUMMARY   PRIMARY DIAGNOSIS:  Mitral valve prolapse with severe mitral regurgitation,  tricuspid regurgitation, paroxysmal atrial fibrillation, patent foramen  ovale.   IN-HOSPITAL DIAGNOSES:  1. Acute renal insufficiency postoperatively.  2. Acute blood loss anemia postoperatively.  3. Postoperative volume overload.   SECONDARY DIAGNOSES:  1. Mitral valve prolapse with severe mitral regurgitation.  2. Hypertension.  3. Benign prostatic hypertrophy.  4. Status post bilateral inguinal hernia repair.   ALLERGIES:  No known drug allergies.   IN-HOSPITAL OPERATIONS AND PROCEDURES:  1. Transesophageal echocardiogram.  2. Cardiac catheterization with right and left heart catheterization.  3. Median sternotomy for mitral valve repair (chordal transposition x2      with 28 mm Edwards physio ring annuloplasty), tricuspid valve repair      using a 30-mm ring annuloplasty, closure of patent foramen ovale,      modified Cox-Maze IV procedure.  4. Intraoperative transesophageal echocardiogram.   HISTORY AND PHYSICAL AND THE HOSPITAL COURSE:  The patient is a 77 year old  Caucasian male from [place] with history of hypertension and mitral valve  prolapse with mitral regurgitation.  The patient has been followed for  several years by Dr. Donnie Aho.  The patient now presents with a 1 to 2 week  history of symptoms of congestive heart failure.  He initially presented to  a local urgent care center where he was noted to be in volume overload with  new onset atrial fibrillation.  The patient was then transferred to [facility].  This  hospitalization, the patient has improved with  medical therapy.  He converted to normal sinus rhythm on amiodarone.  Transesophageal echocardiogram was done confirming the presence of mitral  prolapse with severe mitral regurgitation and moderate to severe tricuspid  regurgitation.  Left ventricular function appeared to be normal.  The  patient then underwent left and right heart catheterization by Dr. Reyes Ivan.  Findings were notable for the presence of insignificant coronary artery  disease and normal left ventricular systolic function.  The patient was  noted to have severe mitral regurgitation and moderate to severe pulmonary  hypertension.  Following catheterization, Dr. Cornelius Moras was consulted.  Dr. Cornelius Moras  saw and evaluated the patient.  He discussed risks and benefits with the  patient of undergoing surgery.  The patient acknowledged understanding and  agreed to proceed.  Surgery was scheduled for August3,2007.  Please see  dictated history and physical for further history and physical exam.   The patient underwent preoperative carotid duplex ultrasound which showed no  significant ICA stenosis bilaterally.   HOSPITAL COURSE:  The patient was taken to the operating room on  August3,2007 where he underwent median sternotomy for mitral valve repair  with chordal transposition  x2 using a 28-mm Edwards physio ring  annuloplasty, tricuspid valve repair using 30-mm ring annuloplasty, closure  of patent foramen ovale, modified Cox-Maze IV procedure.  Postoperative  transesophageal echocardiogram was done following surgery and showed no  residual MR and showed mild TR.  The patient tolerated the procedure well  and was transferred up to the intensive care unit in stable condition.  Following surgery, the patient was seen to be hemodynamically stable.  He  was extubated late evening following surgery.  Following extubation, the  patient was seen to be alert and oriented x3, neuro intact.  The  patient's  postoperative course was pretty much unremarkable.  Postoperative day #1,  the patient was seen to be in normal sinus rhythm.  He was continued on the  amiodarone p.o.  The patient was started on Coumadin postoperative day #2.  He remained in normal sinus rhythm during the remainder of hi hospital  course.  The patient did develop volume overload.  He received several doses  of diuretics.  Due to the diuretics, the patient developed acute renal  insufficiency.  Volume overload increased, and diuretics were decreased.  Creatinine was monitored.  Following decrease in the diuretics, the  patient's creatinine started to trend down back to baseline.  Last  creatinine obtained was postoperative day #4  and was 1.5.  The patient was  back near his baseline prior to discharge home.  He was at 65.9 kg  preoperatively and was weighing 66.7 kg on postoperative day #4.  The  patient also developed acute blood loss anemia postoperatively.  Postoperative day #1, hemoglobin/hematocrit were found to be 9.3 and 27.9.  The patient was asymptomatic from  blood loss.  Hemoglobin/hematocrit were  monitored and seen to be stable.  Last hemoglobin and hematocrit obtained  was done postoperative day #4 and was 9.0 and 26.1.  The patient's chest  tubes and lines were discontinued in the normal fashion.  He was transferred  out to 2000 on postoperative day #3.  The patient was afebrile during his  postoperative course.  Vital signs were monitored and seen to be stable.  Due to the patient's blood pressure being borderline, he was held on  starting beta blocker as well as restarting him back on his calcium channel  blocker and ARB.  Will continue to monitor the patient's blood pressure and  heart rate and if stable may start beta blocker in the future.  The patient  was able to be weaned off oxygen, saturating greater than 90% on room air. As mentioned above, the patient was started on low-dose Coumadin.   Daily  PT/INRs were obtained.  Coumadin was adjusted appropriately.  Last INR was  postoperative day #4 and was 1.3.  Will continue Coumadin dosing until the  patient's INR is therapeutic.  Will obtain PT/INR levels prior to discharge  home.  Coumadin dose will be based on the patient's discharge INR level.  The patient's incisions were clean, dry and intact and healing well.  He was  out of bed ambulating well.  Diet within normal limits.  Postoperative chest  x-rays were stable, showing no pneumothorax or pneumonia noted.   The patient is tentatively ready for discharge home in the next 1-2 days  pending his INR level.  Follow-up appointment has been arranged with Dr.  Cornelius Moras for September10,2007 at 11:30 a.m.  The patient will need to obtain a  PA and lateral chest x-ray one hour prior to this appointment.  The  patient  is to follow up with Dr. Donnie Aho in 2 weeks.  He is to call to schedule  appointment with Dr. Donnie Aho.   ACTIVITY:  The patient was told no driving until released to do so, no  lifting over 10 pounds.  He is told to ambulate 3 to 4 times per day,  progress as tolerated and continue breathing exercises.   INCISIONAL CARE:  The patient is told to shower, washing his incisions using  soap and water.  He is to contact the office if he develops any drainage or  opening from any of his incision sites.   DIET:  The patient was educated on diet to be low fat, low salt.   SPECIAL INSTRUCTIONS:  The patient is to obtain a PT/INR blood work 2 days  post discharge from hospital at Dr. York Spaniel office.  He will need to  contact Dr. York Spaniel office to schedule this appointment.   DISCHARGE MEDICATIONS:  1. Aspirin 81 mg daily.  2. Amiodarone 200 mg t.i.d.  3. Lasix 40 mg daily for 5 days.  4. Potassium chloride 20 mEq daily for 5 days.  5. Coumadin daily at night.  Dose will be based on patient's discharge INR      level.  6. Oxycodone 5 mg 1 to 2 tabs q.4-6 h. p.r.n.  pain.      Theda Belfast, PA      Salvatore Decent. Cornelius Moras, M.D.  Electronically Signed    KMD/MEDQ  D:  02/15/2006  T:  02/15/2006  Job:  [id number]   cc:   Georga Hacking, M.D.

## 2010-11-27 NOTE — Op Note (Signed)
NAMEJOURDAIN, GUAY NO.:  [id number]   MEDICAL RECORD NO.:  [id number]          PATIENT TYPE:  INP   LOCATION:  2304                         FACILITY:  [facility]   PHYSICIAN:  Salvatore Decent. Cornelius Moras, M.D. DATE OF BIRTH:  1934-03-10   DATE OF PROCEDURE:  02/11/2006  DATE OF DISCHARGE:                                 OPERATIVE REPORT   PREOPERATIVE DIAGNOSES:  Mitral valve prolapse with severe mitral  regurgitation, tricuspid regurgitation, paroxysmal atrial fibrillation,  patent foramen ovale.   POSTOPERATIVE DIAGNOSES:  Mitral valve prolapse with severe mitral  regurgitation, tricuspid regurgitation, paroxysmal atrial fibrillation,  patent foramen ovale.   PROCEDURE:  Median sternotomy for mitral valve repair (chordal transposition  x2 with 28 mm Edwards physio ring annuloplasty) tricuspid valve repair (30  mm ring annuloplasty) closure of patent foramen ovale and modified Cox maze  IV procedure.   SURGEON:  Dr. Purcell Nails.   ASSISTANT:  Dr. Charlett Lango.   SECOND ASSISTANT:  Ms. Shonna Chock.   ANESTHESIA:  General.   BRIEF CLINICAL NOTE:  The patient is a 77 year old male with known history  of mitral valve prolapse and mitral regurgitation who has been followed by  Dr. Viann Fish.  The patient now presents with symptoms of congestive  heart failure and he was diagnosed with new-onset paroxysmal atrial  fibrillation.  Transthoracic and transesophageal echocardiograms are  performed confirming the presence of mitral valve prolapse with severe  mitral regurgitation and moderate to severe tricuspid regurgitation as well  as a small patent foramen ovale. Left and right heart catheterization  performed demonstrates no significant coronary artery disease.  There is  moderate pulmonary hypertension.  A full consultation note has been dictated  previously.  The patient and his family have been counseled at length  regarding the indications, risks,  and potential benefits of surgery.  Alternative treatment strategies have been discussed.  They understand and  accept all associated risks of surgery and desire to proceed as described.   OPERATIVE FINDINGS:  1. Mitral valve prolapse with flail portion of the anterior leaflet (A1)      and multiple ruptured chordae tendineae.  2. Severe (4+) mitral regurgitation.  3. Moderate tricuspid regurgitation.  4. Patent foramen ovale.  5. Normal left ventricular function.  6. No residual mitral regurgitation after successful valve repair.  7. Mild (1+) tricuspid regurgitation after valve repair.   DESCRIPTION OF PROCEDURE:  The patient is brought to the operating room on  the above-mentioned date and central monitoring is established by the  anesthesia service under the care and direction of Dr. Bedelia Person.  Specifically, a Swan-Ganz catheter is placed through the right internal  jugular approach.  A radial arterial line is placed.  Intravenous  antibiotics are administered.  Following induction with general endotracheal  anesthesia, a Foley catheter is placed.  The patient's chest, abdomen, both  groins, and both lower extremities are prepared and draped in a sterile  manner.   Baseline transesophageal echocardiogram is performed by Dr. Gypsy Balsam.  This  demonstrates mitral valve prolapse with severe mitral regurgitation.  A  portion of the anterior leaflet appears to be flail with multiple ruptured  chordae tendineae.  A portion of the anterior leaflet appears to correspond  with the A1 portion somewhat eccentrically located towards the anterior  commissure.  Left ventricular function appears normal.  The aortic valve  appears normal.  There is moderate tricuspid regurgitation.  There is a  small patent foramen ovale.   A median sternotomy incision is performed.  The pericardium is opened.  The  patient is heparinized systemically.  The ascending aorta is cannulated for  cardiopulmonary  bypass.  The ascending aorta is normal in appearance.  A  venous cannula is placed directly in the superior vena cava.  A second  venous cannula is placed low in the right atrium with tip extending down the  inferior vena cava.  A retrograde cardioplegic catheter is placed through  the right atrium into the coronary sinus.  Cardiopulmonary bypass is begun.  Vessel loops are placed around the superior vena cava and the inferior vena  cava.  A temperature probe is placed in the left ventricular septum.  A  Cardioplegic catheter is placed in the ascending aorta.   The patient is cooled to 28 degrees systemic temperature.  The aortic  crossclamp is applied and cold blood cardioplegia is administered in  antegrade fashion through the aortic root.  Iced saline slush is applied for  topical hypothermia.  Supplemental cardioplegia is administered retrograde  through the coronary sinus catheter.  The initial cardioplegic arrest and  myocardial cooling is felt to be excellent.  Repeat doses of cardioplegia  are administered intermittently throughout the crossclamp portion of the  operation both through the aortic root and retrograde through the coronary  sinus catheter to maintain left ventricular septal temperature below 15  degrees centigrade.   The heart is retracted towards the surgeon's side and the left-sided  pulmonary veins are exposed.  The left-sided pulmonary veins are encircled  with an umbilical tape. The Medtronic Cardioblate irrigated radiofrequency  bipolar ablation device is utilized to create an elliptical ablation lesion  around the base of the left-sided pulmonary veins.  After this elliptical  lesion is completed, a similar elliptical lesion is created across the base  of the left atrial appendage.  Finally, the Medtronic Cardioblate irrigated  radiofrequency unipolar pen is utilized to create a short linear lesion between the elliptical lesion surrounding the left-sided  pulmonary veins  with the elliptical lesion surrounding the base of the left atrial  appendage.  The heart is now replaced into the pericardial space.  A left  atriotomy incision is performed posteriorly through the interatrial groove.  The atriotomy incision is continued across the back wall of the left atrium.  The bipolar ablation device is now utilized to create an elliptical lesion  around the base of the right-sided pulmonary veins.  The atriotomy incision  serves as the anterior half of this ellipse and the posterior half is  completed with one limb of the ablation device along the endocardial surface  and the other limb along the epicardial surface. A longitudinal lesion is  then created across the dome of the left atrium from the cephalad apex of  the atriotomy incision across the dome to reach the cephalad apex of the  elliptical lesion surrounding the left-sided pulmonary veins.  A similar  parallel lesion is created inferiorly from the caudad apex of the atriotomy  incision across the back wall of the left atrium to reach the  caudad apex of  the elliptical lesion surrounding the left-sided pulmonary veins.  One final  bipolar lesion is then created from the caudad apex of the atriotomy  incision across the back wall of the left atrium towards the mitral valve  annulus.  This lesion is continued all the way onto the mitral valve annulus  using the unipolar handheld ablation pen along the endocardial surface.  This completes the left-sided lesion set of the Cox maze procedure.   The mitral valve is exposed using a self-retaining retractor.  Exposure is  felt to be excellent.  The mitral valve is carefully examined and analyzed.  There is an obvious flail portion of the anterior leaf of the mitral valve  (A1) with rupture of all of the primary chordae tendineae to this segment of  the leaflet.  There is also a couple of ruptured chords to the free margin  of the A2 portion of the  anterior leaflet as well.  There is severe prolapse  of the anterior leaflet, particularly in the A1 region.  The posterior  leaflet appears fairly normal.  The remainder of the subvalvular apparatus  is normal.  The valve has an appearance consistent with fibroelastic  deficiency and does not have characteristics suggestive of our Barlow's  disease.   2-0 Ethibond horizontal mattress annuloplasty sutures are placed  circumferentially around the mitral valve annulus.  The prolapsed portion of  the anterior leaflet is now corrected using a combination of two transfers  of chordae tendineae including one fairly prominent band of secondary chords  from the undersurface of the anterior leaflet which are transected just at  their origin from the anterior leaflet and repositioned along the free  margin of the A1 portion of the anterior leaflet using figure-of-eight 4-0 Ethibond sutures.  After this, a small rectangular portion of the posterior  leaflet near the junction between the P2 and P1 portion of the posterior  leaflet is mobilized using a quadrangular resection and transposed to the  anterior leaflet along the free margin.  The remaining cleft in the  posterior leaflet is closed using a single 2-0 Ethibond figure-of-eight  compression suture at the annulus with interrupted 5-0 Ethibond everting  sutures to close the intervening vertical defect in the posterior leaflet.  One 2-0 Gore-Tex suture is placed through the head of the anterior papillary  muscle as well to facilitate subsequent placement of artificial chordae  tendineae after ring annuloplasty has been completed.  The mitral valve  annulus is sized to approximately a 28-mm annuloplasty ring.  This size  corresponds precisely to the dimensions of the anterior leaflet of the  mitral valve.  A 28-mm Edwards physio ring (model number 4450, serial number  [id number]) is secured in place uneventfully.  After completion of the valve   repair, the valve is tested with iced saline solution into the ventricular  chamber.  The valve appears to remain perfectly competent with no residual  mitral regurgitation.  There is a symmetrical line of coaptation of the  anterior posterior leaflets with an excellent amount of coapting leaflet  tissue beneath the plane of the annulus.  The Gore-Tex suture is removed as  it is felt to be unnecessary and rewarming is begun.   The left atrial appendage is oversewn from within the left atrium using a  two-layer closure of running 3-0 Prolene suture.   An oblique right atriotomy incision is performed beginning along the acute  margin of the heart and extending  posteriorly in a cephalad direction  towards the right inferior pulmonary vein.  The tip of the right atrial  appendage is amputated.  The Medtronic bipolar ablation device is utilized  to create a linear ablation lesion beginning at the posterior apex of the  right atriotomy incision extending in a cephalad direction onto the back  wall of the superior vena cava.  A similar lesion is created in the opposite  direction towards the inferior vena cava.  Another bipolar lesion is then  created from the posterior apex of the right atriotomy incision across the  interatrial septum to reach the inferior rim of the fossa ovalis.  This was  performed with one limb of the device in the left atrium and the other limb  in the right atrium.  The bipolar device was then utilized to create a short  linear lesion beginning at the tip of the right atrial appendage extending  in a direction oriented perpendicular to the other right atriotomy incision.  One final bipolar lesion is then created from the tip of the right atriotomy  incision extending in the anterior medial direction towards the acute margin  of the heart.  The unipolar handheld ablation pen is then utilized to complete the remainder of the right-sided lesion set of the Cox maze   procedure.  A linear lesion is begun at the previous lesion along the  inferior rim of the fossa ovalis continuing across the inferior rim of the  coronary sinus and across the isthmus of the heart to reach the tricuspid  annulus posteriorly.  This lesion is also extended down to the medial  surface of the inferior vena cava.  A short linear lesion is then created  from the anterior apex of the atriotomy incision at the acute margin of the  heart to reach the tricuspid annulus anterolaterally.  One final short  linear lesion is created at the end of the previous bipolar lesion along the  anteromedial surface of the underside of atrial appendage across the atrial  wall to reach the tricuspid annulus along the anteromedial surface.  This  completes the right-sided lesion set of the entire Cox maze procedure.   The left atriotomy incision is closed using a two-layer closure of running 3-  0 Prolene suture.  The small patent foramen ovale is oversewn using a two-  layer closure of running 4-0 Prolene suture.  One final dose of warm  retrograde hot shot cardioplegia is administered.  The aortic crossclamp is  removed after a total crossclamp time of 162 minutes   The heart begins to beat spontaneously without need for cardioversion.  Tricuspid valve annuloplasty is now performed using interrupted 2-0 Ethibond  horizontal mattress sutures placed circumferentially around the tricuspid  annulus with the exception of the small area of the annulus immediately  beneath the conduction system near the AV node.  The tricuspid annulus is  sized to accept between a 28 and a 30 mm ring.  A 30-mm Edwards MC3  annuloplasty ring is secured in place uneventfully (model number 4900,  serial number [id number]).  After completion of the valve repair, there appears  to be trivial residual tricuspid regurgitation with a Swan-Ganz catheter in  place.  The right atriotomy incisions are closed using a two-layer closure   of running 4-0 Prolene suture.   Epicardial pacing wires are placed through the right ventricular outflow  tract into the right atrial appendage.  The IVC cannula is removed and its  cannulation site oversewn with Prolene suture.  The patient is weaned from  cardiopulmonary bypass without difficulty.  The patient's rhythm at  separation from bypass is a junctional rhythm.  AV sequential pacing is  employed.  Normal sinus rhythm resumes spontaneously prior to completion of  the procedure and pacing is discontinued.  Total cardiopulmonary bypass time  for the operation is 227 minutes.  The patient is weaned from bypass on  dopamine at 3 mcg/kg per minute.  Follow-up transesophageal echocardiogram  performed by Dr. Gypsy Balsam demonstrates a normal appearing mitral valve repair  with no residual mitral regurgitation whatsoever.  Left ventricular function  appears mildly hypokinetic in comparison with preoperatively but this continues to improve and returns to baseline prior to completion of the  operation.  There remains mild (1+) tricuspid regurgitation with a Swan-Ganz  catheter in place.  No other abnormalities are noted.  There is no  significant residual air.   The aortic cannula and cardioplegia cannula are both removed uneventfully.  Protamine is administered to reverse the anticoagulation.  The SVC cannula  is removed.  The mediastinum was irrigated with saline solution containing  vancomycin.  Meticulous surgical hemostasis is ascertained.  The mediastinum  and both pleural spaces are drained using four chest tubes exited through  separate stab incisions inferiorly.  The pericardium and soft tissues  anterior to the aorta are reapproximated loosely.   The On-Q continuous pain management system is utilized to facilitate  postoperative pain control. Two 10-inch Silastic catheters supplied with the  On-Q kit are tunneled into the deep subcutaneous tissues and positioned just  lateral to  the lateral border of the sternum on either side.  Each catheter  is flushed with 5 mL of 0.5% bupivacaine solution and ultimately connected  to continuous infusion pump.  The sternum is closed with single strength  sternal wire.  The soft tissues anterior to the sternum are closed in  multiple layers and the skin is closed with a running subcuticular skin  closure.   The patient tolerated the procedure well and is transported to the surgical  intensive care unit in stable condition.  There are no intraoperative  complications.  All sponge, instrument and needle counts are verified  correct at the  completion of the operation.  The patient was transfused 2 units of packed  red blood cells due to anemia which was present preoperatively and  exacerbated by surgery.  The patient was also transfused one pack of adult  platelets due to thrombocytopenia which developed during cardiopulmonary  bypass.      Salvatore Decent. Cornelius Moras, M.D.  Electronically Signed     CHO/MEDQ  D:  02/11/2006  T:  02/11/2006  Job:  [id number]   cc:   Georga Hacking, M.D.  Elmore Guise., M.D.

## 2010-11-27 NOTE — Op Note (Signed)
NAME:  Jack [name], Jack [name] NO.:  [id number]   MEDICAL RECORD NO.:  [id number]                   PATIENT TYPE:  AMB   LOCATION:  ENDO                                 FACILITY:  [facility]   PHYSICIAN:  Bernette Redbird, M.D.                DATE OF BIRTH:  May 14, 1934   DATE OF PROCEDURE:  08/30/2003  DATE OF DISCHARGE:                                 OPERATIVE REPORT   PROCEDURE PERFORMED:  Colonoscopy.   ENDOSCOPIST:  Florencia Reasons, M.D.   INDICATIONS FOR PROCEDURE:  The patient is a 77 year old gentleman with a  family history of colon cancer in his brother who was diagnosed around age  65.   FINDINGS:  Extensive pancolonic diverticulosis.  Firm prostate gland.   DESCRIPTION OF PROCEDURE:  The nature, purpose and risks of the procedure  were familiar to the patient from prior examination and he provided written  consent.  Sedation was fentanyl 60 mcg and Versed 5 mg IV without  arrhythmias or desaturation.   Digital exam showed a very firm, but non-nodular prostate gland.  The  Olympus adjustable tension pediatric video colonoscope was  easily advanced  to the cecum, identified by clear visualization of the appendiceal orifice,  using some external abdominal compression to control looping.  The quality  of the prep was excellent and it is felt that all areas were well seen  during pullback.  There was extensive diverticulosis pretty much throughout  the colon.  However, no polyps, cancer, colitis or vascular malformations  were observed and retroflexion in the rectum was normal.  I believe the  rectosigmoid was reinspected as well without additional findings.  No  biopsies were obtained.  The patient tolerated the procedure well.  There  were no apparent complications.   IMPRESSION:  1. Family history of colon cancer, with negative colonoscopic findings at     this time (V16.0).  2. Extensive diverticulosis.   PLAN:  Follow-up colonoscopy in five  years.                                               Bernette Redbird, M.D.    RB/MEDQ  D:  08/30/2003  T:  08/31/2003  Job:  [id number]   cc:   Harrel Lemon. Merla Riches, M.D.  [address]  [place]  [place] [address]  Fax: [phone]

## 2010-11-27 NOTE — Op Note (Signed)
NAMEJAYMIAN, BOGART NO.:  [id number]   MEDICAL RECORD NO.:  [id number]          PATIENT TYPE:  INP   LOCATION:  2304                         FACILITY:  [facility]   PHYSICIAN:  Bedelia Person, M.D.        DATE OF BIRTH:  Jul 29, 1933   DATE OF PROCEDURE:  02/11/2006  DATE OF DISCHARGE:                                 OPERATIVE REPORT   ANESTHESIOLOGIST:  Bedelia Person, M.D.   PROCEDURE:  Intraoperative transesophageal echocardiography.   HISTORY:  Mr. Sturdevant is an elderly male, who was admitted in congestive  heart failure.  He is known on cardiac catheterization to have abnormalities  of his tricuspid and mitral valves.  These will be evaluated  intraoperatively and assessed for repair or replacement.   DESCRIPTION OF PROCEDURE:  After induction with general anesthesia, the  airway was secured with an oroendotracheal tube.  The gastric contents were  suctioned with an orogastric tube, which was then removed.  The  transesophageal transducer was heavily lubricated, placed in a sleeve, which  was then lubricated, and placed down the oropharynx blindly without  resistance.  It remained at the 40 to 45-cm mark throughout the case,  obtaining various views.  The pre-bypass examination revealed the left  ventricle to be slightly thickened.  There was overall good contractility.  No segmental defects were detected.  The left atrium was slightly enlarged.  The appendage was clean.  There was a small atrial septal defect noted with  a left-to-right shunt.  The mitral valve leaflets appeared just very  slightly thickened.  There was severe prolapse to a flailed portion of the  anterior leaflet located at the A1 position. The posterior leaflet was  normal in function.  Color Doppler revealed 3 to 4+ mitral regurgitant flow,  with the jet directed over the posterior leaflet.  The jet was well defined  from the anterior portion of the valve.  The aortic valve had 3 leaflets,  opened  and closed appropriately; no calcification noted.  There was no  aortic insufficiency.  The tricuspid valve was normal in appearance.  The  leaflets opened and closed appropriately in the valvular plane.  Color  Doppler did reveal a moderate diffuse regurgitant flow.  The patient was  placed on cardiopulmonary bypass and underwent mitral valve repair with a  ring, as well as tricuspid valve ring placement, and closure of a small PFO.  At the completion of the bypass period, there were numerous air bubbles,  which were cleared with the left ventricular vent.  The post-bypass  examination revealed the left ventricle to have severe hypokinesis initially  of the anterior wall.  This improved with inotropic dopamine and time after  bypass.  Contractility did remain slightly deepened from pre-bypass  examination.  The mitral valve now exhibited a well-seated ring.  The  anterior leaflet appeared somewhat smaller in size.  It coapted well with  the posterior leaflet.  There was no longer noted to be a prolapsed portion.  Color Doppler revealed no significant regurgitant flow.  The aortic valve  continued to show 3 leaflets.  It opened and closed appropriately.  There  was no significant change from the pre-bypass examination.  The interatrial  septum exhibited stitches, which closed the PFO.  No left-to-right shunt was  noted.  The tricuspid valve revealed a well-seated ring.  The valve still  opened and closed appropriately.  There was mild diffuse tricuspid  regurgitation with the Swan-Ganz catheter noted across the valve.  There  were no other significant changes.           ______________________________  Bedelia Person, M.D.    LK/MEDQ  D:  02/11/2006  T:  02/12/2006  Job:  [id number]

## 2010-11-27 NOTE — Discharge Summary (Signed)
Jack [name], Jack [name] NO.:  [id number]   MEDICAL RECORD NO.:  [id number]          PATIENT TYPE:  INP   LOCATION:  3035                         FACILITY:  [facility]   PHYSICIAN:  Michaelyn Barter, M.D. DATE OF BIRTH:  [birth year]   DATE OF ADMISSION:  12/11/2007  DATE OF DISCHARGE:  12/15/2007                               DISCHARGE SUMMARY   FINAL DIAGNOSIS:  1. Acute right middle cerebral artery infarct.  2. Oropharyngeal dysphagia.   CONSULTATION:  Neurology.   PROCEDURES:  1. Carotid duplex, completed December 12, 2007.  2. Transesophageal echocardiogram, completed December 15, 2007.  3. MRI of the brain, completed December 12, 2007.  4. MRA of the neck with and without contrast, completed December 12, 2007.   HISTORY OF PRESENT ILLNESS:  [name] is a 77 year old gentleman.  He  indicated that the day prior to this admission, he went to hiking with  his grandchildren.  He hiked for several miles.  At approximately 5  o'clock, he went to rest.  His speech became slurred and he decided to  head home.  At approximately 10 p.m., he found his blood pressure to be  elevated with a systolic of 180.  He also developed facial asymmetry.  EMS was called and the patient was brought to the hospital for  evaluation.   PAST MEDICAL HISTORY:  Please see that dictated by Dr. Tildon Husky and Dr.  Saunders Revel.   HOSPITAL COURSE:  1. Acute right MCA infarct.  CT scan of the patient's head was done      without contrast on June 2.  A probable subacute nonhemorrhagic      stroke in the left posterior cerebral artery distribution was      noted.  An MRI was also done on June 2.  It revealed a small area      of acute right MCA territory infarction involving the right motor      strip without associated hemorrhage or mass effect.  An MRA of the      patient's neck was also completed.  No evidence of carotid      atherosclerosis or stenosis was noted.  The tortuous proximal left      vertebral artery  with suggestion of stenosis possibly has much as      50% with respect to the distal vessel was noted.  An MRA of the      patient's head was done, subtle attenuation of flow throughout to      visualize right MCA and three divisions without focal vessel      occlusion or stenosis identified.  Otherwise, the MRA of the brain      was noted to be negative.  Neurology was consulted.  Dr. Terrace Arabia saw      the patient on June 2.  Her additional recommendations were that      the patient undergo a TEE.  This was completed on June 5.  No      intracardiac shunt was detected and no further workup was deemed to      be  necessary.  The patient's condition remained stable throughout      his hospital course.  He indicated each day that he wanted to go      home.  Speech therapy was consulted and the patient had a modified      barium swallow study completed on June 3.  They indicated that the      patient displayed moderate oropharyngeal dysphagia characterized by      delayed swallow initiation secondary to decreased sensation leading      to silent aspiration of thin liquids via straw.  They recommended a      dysphagia III diet as well as thin liquids.  By June 4, the stroke      team indicated that the patient appeared to be back to his      baseline.  He denies having any dysarthria, and finally at his      request, he was discharged home.  2. Uncontrolled hypertension.  The patient's blood pressure      medications were adjusted to optimally control his blood pressure.    On the day of discharge, his temperature was 97.9, heart rate 67,  respirations 20, blood pressure 126/69, and O2 sat 96% on room air.  The  decision was made to discharge him home, as he had requested numerous  times.   His medications consisted of Plavix 75 mg q. daily.  He was told that it  was okay to continue his prior home medications.      Michaelyn Barter, M.D.  Electronically Signed     OR/MEDQ  D:   01/17/2008  T:  01/17/2008  Job:  [id number]

## 2010-11-27 NOTE — Cardiovascular Report (Signed)
Jack [name], OESTREICH NO.:  [id number]   MEDICAL RECORD NO.:  [id number]          PATIENT TYPE:  INP   LOCATION:  3705                         FACILITY:  [facility]   PHYSICIAN:  Elmore Guise., M.D.DATE OF BIRTH:  1933-11-01   DATE OF PROCEDURE:  02/09/2006  DATE OF DISCHARGE:                              CARDIAC CATHETERIZATION   PROCEDURE:  Right and left heart catheterization.   INDICATIONS FOR PROCEDURE:  History of severe mitral regurgitation with  partially flail anterior mitral valve leaflet, new onset heart failure and  atrial fibrillation.   DESCRIPTION OF PROCEDURE:  The patient was brought to the cardiac cath lab  after appropriate informed consent.  He was prepped and draped in a sterile  fashion.  Approximately 10 mL of 1% lidocaine was used for local anesthesia.  A 7-French sheath was placed in the right femoral vein and a 6-French sheath  placed in the right femoral artery without difficulty.  Right heart  catheterization was then performed with modified saturation run.  This was  followed by a left heart catheterization, coronary angiogram, LV angiogram  and limited right femoral angiogram.   FINDINGS:  Right heart catheterization measurements:    RA:  26 mmHg.    RV:  68/15 mmHg.    PA:  65/31 mmHg.    PCWP:  39 mmHg.    LVEDP:  31 mmHg.   Saturation run:  High RA saturation 58%, mid RA saturation 59% low RA  saturation 51%, PA saturation 54%, PCWP saturation 96%, and AO saturation  92%.   CORONARIES:  1.  Left Main:  Normal.  2.  LAD normal-appearing with mild luminal irregularities.  3.  D-1:  Large vessel with mild luminal irregularities.  4.  D-2:  Small to moderate size vessel, normal-appearing with mild luminal      irregularities.  5.  Ramus intermedius:  Small vessel with mild luminal irregularities.  6.  LCX:  Nondominant with high branching OM-1 with mild luminal      irregularities.  7.  OM-2:  Moderate to large vessel  with mild luminal irregularities.  8.  RCA:  Dominant with mild luminal irregularities.  PDA and PLV, no      significant disease.  9.  LV:  EF of 60%.  No wall motion abnormalities.  By TEE he has a history      of severe mitral regurgitation.  Hand injection was performed to save      contrast load during this procedure.  10. Right femoral angiogram showed no significant disease.  It is torturous      at the transition to the iliac artery.   IMPRESSION:  1.  Normal-appearing coronary arteries with mild luminal irregularities.  2.  History of severe mitral regurgitation with partially flail anterior      mitral leaflet with moderate to severe tricuspid regurgitation.  3.  Moderate to severe pulmonary hypertension with pulmonary artery systolic      pressures in the 65/30 range.  4.  Mild step-up in the right atrium consistent with shunt.  PLAN:  Surgical referral for mitral valve and tricuspid valve  repair/replacement, PFO closure and possible maze procedure.  He has been  hemodynamically managed for his atrial fibrillation and is now in normal  sinus rhythm, and his heart failure symptoms have resolved.      Elmore Guise., M.D.  Electronically Signed     TWK/MEDQ  D:  02/09/2006  T:  02/09/2006  Job:  [id number]   cc:   Georga Hacking, M.D.

## 2011-01-12 ENCOUNTER — Encounter: Payer: Self-pay | Admitting: Family Medicine

## 2011-04-08 LAB — COMPREHENSIVE METABOLIC PANEL
ALT: 14
AST: 20
Albumin: 3.5
Alkaline Phosphatase: 33 — ABNORMAL LOW
Alkaline Phosphatase: 34 — ABNORMAL LOW
BUN: 11
BUN: 14
CO2: 28
Calcium: 8.7
Chloride: 105
GFR calc Af Amer: >60
GFR calc non Af Amer: >60
Glucose, Bld: 85
Potassium: 3.6
Potassium: 3.7
Sodium: 139
Total Bilirubin: 0.7
Total Protein: 5.7 — ABNORMAL LOW

## 2011-04-08 LAB — LIPID PANEL
LDL Cholesterol: 88
Triglycerides: 94

## 2011-04-08 LAB — PROTIME-INR
INR: 1.2
Prothrombin Time: 15.2

## 2011-04-08 LAB — TROPONIN I: Troponin I: 0.02

## 2011-04-08 LAB — CBC
HCT: 36.4 — ABNORMAL LOW
Hemoglobin: 12.4 — ABNORMAL LOW
Hemoglobin: 12.8 — ABNORMAL LOW
MCHC: 34
MCHC: 34.2
RBC: 4.16 — ABNORMAL LOW
RDW: 14.6
RDW: 15.2

## 2011-04-08 LAB — TSH: TSH: 1.446

## 2011-04-08 LAB — POCT I-STAT, CHEM 8
Calcium, Ion: 1.09 — ABNORMAL LOW
Glucose, Bld: 99
HCT: 39
Hemoglobin: 13.3
Potassium: 3.4 — ABNORMAL LOW

## 2011-04-08 LAB — CK TOTAL AND CKMB (NOT AT ARMC): CK, MB: 3

## 2011-04-08 LAB — URINALYSIS, ROUTINE W REFLEX MICROSCOPIC
Bilirubin Urine: NEGATIVE
Ketones, ur: NEGATIVE
Nitrite: NEGATIVE
Specific Gravity, Urine: 1.003 — ABNORMAL LOW
Urobilinogen, UA: 0.2

## 2011-04-22 ENCOUNTER — Encounter: Payer: Self-pay | Admitting: Medical

## 2011-04-22 ENCOUNTER — Ambulatory Visit (INDEPENDENT_AMBULATORY_CARE_PROVIDER_SITE_OTHER): Payer: 59 | Admitting: Medical

## 2011-04-22 VITALS — BP 110/80 | HR 64 | Temp 98.3°F | Resp 16 | Ht 62.0 in | Wt 148.0 lb

## 2011-04-22 DIAGNOSIS — J069 Acute upper respiratory infection, unspecified: Secondary | ICD-10-CM

## 2011-04-22 DIAGNOSIS — I4891 Unspecified atrial fibrillation: Secondary | ICD-10-CM | POA: Insufficient documentation

## 2011-04-22 MED ORDER — AZITHROMYCIN 250 MG PO TABS: ORAL_TABLET | ORAL | Status: AC

## 2011-04-22 NOTE — Progress Notes (Signed)
Subjective:   HPI  Jack [name] is a 77 y.o. male who presents for cough x 1+ week.  He is here today with his wife.  She says she caught his cough, but her's cleared up after about week.  She says he has been coughing for more than a week, breathing sounds heavy or wheezy, and he notes coughing up some phlegm.   He doesn't feel all that sick, but the cough has lingered until last night when he began [facility] brand Cold and FLu medication which seems to be helping.  It contains Acetaminophen and Dextromethorphan.   Otherwise he has been in his usual state of health.  Last illness over a year ago. No other aggravating or relieving factors.   No other c/o.  He has a hx/o A. Fib, but he says he has never felt a palpitation.  When he was initially diagnosed he had fatigue/weakness.  The following portions of the patient's history were reviewed and updated as appropriate: allergies, current medications, past family history, past medical history, past social history, past surgical history and problem list.  Past Medical History  Diagnosis Date  . Hypertension   . ED (erectile dysfunction)   . CVA (cerebral infarction) 2009  . Atrial fibrillation     followed by Dr. Donnie Aho  . Hyperlipidemia   . Patent foramen ovale     history of  . Microscopic hematuria   . Elevated PSA     [facility], Dr. Aldean Ast;   . Cellulitis of face 11/08    [facility]NT   Past Surgical History  Procedure Date  . Mitral valve repair      No Known Allergies  Current Outpatient Prescriptions on File Prior to Visit  Medication Sig Dispense Refill  . amLODipine (NORVASC) 5 MG tablet Take 5 mg by mouth daily.        Marland Kitchen aspirin 81 MG tablet Take 81 mg by mouth daily.        . sildenafil (VIAGRA) 50 MG tablet Take 50 mg by mouth daily as needed.        . valsartan-hydrochlorothiazide (DIOVAN-HCT) 160-12.5 MG per tablet Take 1 tablet by mouth daily.         Review of Systems Constitutional: denies fever,  chills, sweats, unexpected weight change, fatigue ENT: no runny nose, ear pain, sore throat Cardiology: denies chest pain, palpitations, edema Respiratory: +cough, somewhat productive Gastroenterology: denies abdominal pain, nausea, vomiting, diarrhea Urology: denies dysuria     Objective:   Physical Exam  General appearance: alert, no distress, WD/WN, white male, pleasant HEENT: normocephalic, sclerae anicteric, TMs pearly, nares patent, no discharge or erythema, pharynx normal Oral cavity: MMM, no lesions Neck: supple, no lymphadenopathy, no thyromegaly, no masses Chest: vertical CABG surgical scar Heart: II/VI systolic murmur that is brief, heard best in lower left sternal border, and there is a rhythmic pattern with first lub dub higher pitch than second lub dub sounds, otherwise RRR Lungs: CTA bilaterally, no wheezes, rhonchi, or rales Pulses: 2+ symmetric, upper and lower extremities, normal cap refill  EKG today with 89 bpm, sinus arrhythmia, bigeminal pattern, first degree AV block, left axis deviation, no prior to compare, no worrisome acute changes.  Assessment :    Encounter Diagnoses  Name Primary?  . URI (upper respiratory infection) Yes  . Atrial fibrillation      Plan:     URI - advised he can continue his OTC cough syrup after reviewing the ingredients, and it has  helped his cough.  Will start Zpak.  Advised rest, hydrate well, and if worse or not improving, call or return.  Hx/o Atrial fibrillation - no exacerbation at this time.  reviewed prior cardiology notes, and Dr. Susann Givens, supervising physician, examined EKG and patient along with me.  F/u with Dr. Donnie Aho on his normal schedule.

## 2011-04-26 ENCOUNTER — Other Ambulatory Visit: Payer: Self-pay | Admitting: Medical

## 2011-04-26 ENCOUNTER — Telehealth: Payer: Self-pay | Admitting: Medical

## 2011-04-26 MED ORDER — PREDNISONE 20 MG PO TABS: ORAL_TABLET | ORAL | Status: DC

## 2011-04-26 NOTE — Telephone Encounter (Signed)
Pt is still sick, maybe a little but better. Still coughing and wheezing today is last day of rx. Please call pt and inform of what next. Pt uses [facility] on [address]

## 2011-04-26 NOTE — Telephone Encounter (Signed)
I called and spoke to wife.   He has finished zpak, but still feels a little wheezy.  Cough is improved, and he feels no worse.  Just has some wheeze occasionally.  I advised that I would call in some Prednisone.  He can c/t same cough meds OTC that he has been using.  Call/return if worse or not improving

## 2011-06-25 ENCOUNTER — Ambulatory Visit (INDEPENDENT_AMBULATORY_CARE_PROVIDER_SITE_OTHER): Payer: Medicare Other | Admitting: Medical

## 2011-06-25 ENCOUNTER — Encounter: Payer: Self-pay | Admitting: Medical

## 2011-06-25 VITALS — BP 140/70 | HR 92 | Temp 98.5°F | Resp 16 | Wt 146.0 lb

## 2011-06-25 DIAGNOSIS — R05 Cough: Secondary | ICD-10-CM

## 2011-06-25 DIAGNOSIS — R062 Wheezing: Secondary | ICD-10-CM | POA: Insufficient documentation

## 2011-06-25 DIAGNOSIS — J310 Chronic rhinitis: Secondary | ICD-10-CM

## 2011-06-25 DIAGNOSIS — R0602 Shortness of breath: Secondary | ICD-10-CM | POA: Insufficient documentation

## 2011-06-25 LAB — CBC WITH DIFFERENTIAL/PLATELET
Basophils Absolute: 0 10*3/uL (ref 0.0–0.1)
Eosinophils Relative: 2 % (ref 0–5)
Lymphocytes Relative: 24 % (ref 12–46)
Lymphs Abs: 1.6 10*3/uL (ref 0.7–4.0)
MCV: 88.5 fL (ref 78.0–100.0)
Neutro Abs: 4.2 10*3/uL (ref 1.7–7.7)
Neutrophils Relative %: 64 % (ref 43–77)
Platelets: 363 10*3/uL (ref 150–400)
RBC: 4.85 MIL/uL (ref 4.22–5.81)
RDW: 14.4 % (ref 11.5–15.5)
WBC: 6.7 10*3/uL (ref 4.0–10.5)

## 2011-06-25 LAB — BASIC METABOLIC PANEL
BUN: 16 mg/dL (ref 6–23)
Calcium: 9.5 mg/dL (ref 8.4–10.5)
Glucose, Bld: 98 mg/dL (ref 70–99)
Potassium: 3.8 mEq/L (ref 3.5–5.3)

## 2011-06-25 MED ORDER — ALBUTEROL SULFATE HFA 108 (90 BASE) MCG/ACT IN AERS: 2.0000 | INHALATION_SPRAY | Freq: Four times a day (QID) | RESPIRATORY_TRACT | Status: DC | PRN

## 2011-06-25 MED ORDER — PREDNISONE 20 MG PO TABS: ORAL_TABLET | ORAL | Status: DC

## 2011-06-25 MED ORDER — CETIRIZINE HCL 10 MG PO TABS: 10.0000 mg | ORAL_TABLET | Freq: Every day | ORAL | Status: DC

## 2011-06-25 MED ORDER — BENZONATATE 100 MG PO CAPS: 100.0000 mg | ORAL_CAPSULE | Freq: Four times a day (QID) | ORAL | Status: DC | PRN

## 2011-06-25 NOTE — Progress Notes (Signed)
Subjective:   HPI  Jack [name] is a 77 y.o. male who presents for cough.  He reports ongoing cough since October.  He'll seem to have days when its seems like cough is going away, but then he will get it again.  Cough intermittent, but most days since October.  He does get up some phlegm.  Denies hx/o tobacco use.  Denies any hx/o allergies, but does sneeze right much.  In the mornings gets runny nose.  He does note hx/o heartburn, right much.  Denies hx/o GERD.   Not currently using heartburn medication.   Denies chest pain, difficulty breathing, denies swelling in legs.  Last cardiology visit with Dr. Donnie Aho was 67mo ago.  Denies hx/o CHF.  No other aggravating or relieving factors.    No other c/o.  The following portions of the patient's history were reviewed and updated as appropriate: allergies, current medications, past family history, past medical history, past social history, past surgical history and problem list.  Past Medical History  Diagnosis Date  . Hypertension   . ED (erectile dysfunction)   . CVA (cerebral infarction) 2009  . Atrial fibrillation     followed by Dr. Donnie Aho  . Hyperlipidemia   . Patent foramen ovale     history of  . Microscopic hematuria   . Elevated PSA     [facility], Dr. Aldean Ast;   . Cellulitis of face 11/08    [facility]   Review of Systems Constitutional: -fever, -chills, -sweats, -unexpected -weight change,-fatigue ENT: +runny nose, -ear pain, -sore throat Cardiology:  -chest pain, -palpitations, -edema Respiratory: +cough, +shortness of breath, +wheezing Gastroenterology: -abdominal pain, -nausea, -vomiting, -diarrhea, -constipation Hematology: -bleeding or bruising problems Musculoskeletal: -arthralgias, -myalgias, -joint swelling, -back pain Ophthalmology: -vision changes Urology: -dysuria, -difficulty urinating, -hematuria, -urinary frequency, -urgency Neurology: -headache, -weakness, -tingling, -numbness     Objective:     Physical Exam  Filed Vitals:   06/25/11 0837  BP: 140/70  Pulse: 92  Temp: 98.5 F (36.9 C)  Resp: 16    General appearance: alert, no distress, WD/WN HEENT: normocephalic, sclerae anicteric, TMs pearly, nares patent, no discharge or erythema, pharynx normal Oral cavity: MMM, no lesions Neck: supple, no lymphadenopathy, no thyromegaly, no masses Heart: RRR, normal S1, S2, no murmurs Lungs: decreased breath sounds, +scattered wheezes, rhonchi, or rales Abdomen: +bs, soft, non tender, non distended, no masses, no hepatomegaly, no splenomegaly Pulses: 2+ symmetric, upper and lower extremities, normal cap refill Ext: no edema, cyanosis or clubbing, no calve tenderness   Assessment and Plan :    Encounter Diagnoses  Name Primary?  . Cough Yes  . Wheezing   . Rhinitis   . Shortness of breath    CXR today with no acute pulmonary changes, there is possible cardiomegaly, and there are ventral metal sutures c/w prior sternotomy.  Will send CXR for overread.   Advised that symptoms suggest recent allergy trigger.  Scripts today for prednisone, Tessalon Perles, sample of Proair and instruction given, and advised he begin Zyrtec daily.  Call if worse or not improving.  We will call with lab results Follow-up prn

## 2011-06-25 NOTE — Patient Instructions (Signed)
Begin steroid prednisone, 3 tablets today and tomorrow, then 2 tablets daily for the next 3 days.  Begin OTC Zyrtec 10mg  tablet at bedtime.     You can use Tessalon Perles cough drops as needed for cough.    If this regimen isn't helping in a few days, or if much worse, you can begin the inhaler.  Use 2 puffs inhaled up to 3 times daily as needed for wheezing and shortness of breath.    We will call with lab results.  Please follow up with Dr. Donnie Aho as planned in January.   Bronchospasm A bronchospasm is when the tubes that carry air in and out of your lungs (bronchioles) become smaller. It is hard to breathe when this happens. A bronchospasm can be caused by:  Asthma.   Allergies.   Lung infection.  HOME CARE   Do not  smoke. Avoid places that have secondhand smoke.   Dust your house often. Have your air ducts cleaned once or twice a year.   Find out what allergies may cause your bronchospasms.   Use your inhaler properly if you have one. Know when to use it.   Eat healthy foods and drink plenty of water.   Only take medicine as told by your doctor.  GET HELP RIGHT AWAY IF:  You feel you cannot breathe or catch your breath.   You cannot stop coughing.   Your treatment is not helping you breathe better.  MAKE SURE YOU:   Understand these instructions.   Will watch your condition.   Will get help right away if you are not doing well or get worse.  Document Released: 04/25/2009 Document Revised: 03/10/2011 Document Reviewed: 04/25/2009 [facility] Patient Information 2012 [place], [place].

## 2011-06-26 LAB — BRAIN NATRIURETIC PEPTIDE: Brain Natriuretic Peptide: 44.4 pg/mL (ref 0.0–100.0)

## 2011-06-26 LAB — D-DIMER, QUANTITATIVE: D-Dimer, Quant: 0.45 ug/mL-FEU (ref 0.00–0.48)

## 2011-07-02 ENCOUNTER — Ambulatory Visit (INDEPENDENT_AMBULATORY_CARE_PROVIDER_SITE_OTHER): Payer: Medicare Other | Admitting: Medical

## 2011-07-02 ENCOUNTER — Encounter: Payer: Self-pay | Admitting: Medical

## 2011-07-02 VITALS — BP 120/70 | HR 78 | Temp 98.2°F | Resp 16 | Wt 147.0 lb

## 2011-07-02 DIAGNOSIS — G4730 Sleep apnea, unspecified: Secondary | ICD-10-CM

## 2011-07-02 DIAGNOSIS — R059 Cough, unspecified: Secondary | ICD-10-CM

## 2011-07-02 DIAGNOSIS — R05 Cough: Secondary | ICD-10-CM

## 2011-07-02 MED ORDER — OMEPRAZOLE 20 MG PO CPDR: 20.0000 mg | DELAYED_RELEASE_CAPSULE | Freq: Every day | ORAL | Status: DC

## 2011-07-02 MED ORDER — BENZONATATE 100 MG PO CAPS: 100.0000 mg | ORAL_CAPSULE | Freq: Three times a day (TID) | ORAL | Status: AC | PRN

## 2011-07-02 NOTE — Patient Instructions (Signed)
Begin Prilosec samples, 1 tablet 45 minutes before breakfast daily.  Use 1/2 tablet of Zyrtec 10mg  at night time daily.  You can use Albuterol inhaler, 2 puffs every 6 hours as needed for wheezing/shortness of breath.   I am going to refill tessalon Perles cough suppressant you can use as needed up to 3 times daily.   Drink plenty of water.  Consider raising the head of the bed for now so that you are not completely flat in sleep.   Talk to Dr. Donnie Aho about a sleep study.

## 2011-07-02 NOTE — Progress Notes (Signed)
Subjective:   HPI  Jack [name] is a 77 y.o. male who presents with recheck on cough.  I saw him about a week ago for ongoing cough, but at that time he was wheezing and short of breath.  Since last visit the cough has improved, but he still gets bad coughing spells at night.  He did start the zyrtec, but this dried him out too much.  He used the inhaler and it did calm down the cough.  He used the [employer], 5 day course of Prednisone.Marland Kitchen  He still is having some cough.  He and wife report that he snores, he sometimes stops breathing in his sleep.  He notes daytime somnolence, and not particularly rested in the mornings.  He does get up early each morning to work at the [employer].  No other aggravating or relieving factors.    No other c/o.  The following portions of the patient's history were reviewed and updated as appropriate: allergies, current medications, past family history, past medical history, past social history, past surgical history and problem list.  Past Medical History  Diagnosis Date  . Hypertension   . ED (erectile dysfunction)   . CVA (cerebral infarction) 2009  . Atrial fibrillation     followed by Dr. Donnie Aho  . Hyperlipidemia   . Patent foramen ovale     history of  . Microscopic hematuria   . Elevated PSA     [facility], Dr. Aldean Ast;   . Cellulitis of face 11/08    [facility]    Review of Systems Constitutional: -fever, -chills, -sweats, -unexpected -weight change,-fatigue ENT: -runny nose, -ear pain, -sore throat Cardiology:  -chest pain, -palpitations, -edema Respiratory: +cough, +shortness of breath, +wheezing Gastroenterology: -abdominal pain, -nausea, -vomiting, -diarrhea, -constipation Hematology: -bleeding or bruising problems Musculoskeletal: -arthralgias, -myalgias, -joint swelling, -back pain Ophthalmology: -vision changes Urology: -dysuria, -difficulty urinating, -hematuria, -urinary frequency, -urgency Neurology:  -headache, -weakness, -tingling, -numbness      Objective:   Physical Exam  Filed Vitals:   07/02/11 1010  BP: 120/70  Pulse: 78  Temp: 98.2 F (36.8 C)  Resp: 16    General appearance: alert, no distress, WD/WN  HEENT: normocephalic, sclerae anicteric, TMs pearly, nares patent, no discharge or erythema, pharynx normal Oral cavity: MMM, no lesions Neck: supple, no lymphadenopathy, no thyromegaly, no masses Heart: occasional ectopic beats, otherwise RRR, normal S1, S2, no murmurs Lungs: CTA bilaterally, no wheezes, rhonchi, or rales Pulses: 2+ Ext: no edema  Assessment and Plan :    Encounter Diagnoses  Name Primary?  . Cough Yes  . Sleep apnea    Cough - reviewed his recent labs, advised he rest, hydrate well, discussed causes of cough.  I suspect his is allergen and possible GERD induced.  He will use 1/2 tablet Zyrtec, and begin samples of Prilosec, refilled tessalon Perles.  He will call report 2wk.   Sleep apnea - he has some symptoms suggestive of sleep apnea.  Discussed eval, possible treatment.  He declines sleep study today.   He has f/u with cardiology in January.   Follow-up with call in 2wk.

## 2011-07-14 ENCOUNTER — Telehealth: Payer: Self-pay | Admitting: Family Medicine

## 2011-07-14 NOTE — Telephone Encounter (Signed)
Message copied by Janeice Robinson on Wed Jul 14, 2011  2:05 PM ------      Message from: Aleen Campi, DAVID S      Created: Wed Jul 07, 2011  3:22 PM       pls send copy of my recent OV note to Dr. Donnie Aho

## 2011-07-14 NOTE — Telephone Encounter (Signed)
LAST OV NOTES WAS FAX OVER TO DR. TILLEY. CLS

## 2011-10-12 ENCOUNTER — Other Ambulatory Visit: Payer: Self-pay | Admitting: Cardiology

## 2011-12-15 ENCOUNTER — Other Ambulatory Visit: Payer: Self-pay | Admitting: Cardiology

## 2012-01-04 ENCOUNTER — Telehealth: Payer: Self-pay | Admitting: Family Medicine

## 2012-01-04 NOTE — Telephone Encounter (Signed)
Message copied by Janeice Robinson on Tue Jan 04, 2012  2:44 PM ------      Message from: Aleen Campi, DAVID S      Created: Mon Jan 03, 2012  4:16 PM       Due for OV

## 2012-01-04 NOTE — Telephone Encounter (Signed)
LM WITH THE PATIENTS WIFE FOR THE PATIENT TO CB AND SCHEDULE A OV APPOINTMENT. CLS

## 2012-05-11 ENCOUNTER — Encounter: Payer: Self-pay | Admitting: Medical

## 2012-07-10 ENCOUNTER — Encounter: Payer: Self-pay | Admitting: Medical

## 2012-07-10 ENCOUNTER — Ambulatory Visit (INDEPENDENT_AMBULATORY_CARE_PROVIDER_SITE_OTHER): Payer: Medicare Other | Admitting: Medical

## 2012-07-10 VITALS — BP 128/80 | HR 74 | Temp 98.3°F | Resp 16 | Wt 146.0 lb

## 2012-07-10 DIAGNOSIS — J40 Bronchitis, not specified as acute or chronic: Secondary | ICD-10-CM

## 2012-07-10 DIAGNOSIS — R062 Wheezing: Secondary | ICD-10-CM

## 2012-07-10 MED ORDER — AZITHROMYCIN 500 MG PO TABS: 500.0000 mg | ORAL_TABLET | Freq: Every day | ORAL | Status: AC

## 2012-07-10 MED ORDER — ALBUTEROL SULFATE HFA 108 (90 BASE) MCG/ACT IN AERS: 2.0000 | INHALATION_SPRAY | Freq: Four times a day (QID) | RESPIRATORY_TRACT | Status: DC | PRN

## 2012-07-10 MED ORDER — PREDNISONE 10 MG PO TABS: ORAL_TABLET | ORAL | Status: DC

## 2012-07-10 NOTE — Patient Instructions (Signed)
Begin Prednisone steroid taper as labeled.  Begin Azithromycin antibiotic, one tablet daily for 3 days  Continue Albuterol inhaler, 2 puffs every 4-6 hours as needed for wheezing/cough spells.    If not much better by end of the week let me know.

## 2012-07-10 NOTE — Progress Notes (Signed)
  Subjective:   HPI  Jack [name] is a 78 y.o. male who presents with 4 day hx/o cough, congestion, wheezing, yellow sputum and nasal drainage.  Saw me for similar a year ago.   Using OTC cough medication and albuterol which helps the wheezing and SOB.  Denies fever, NVD, no chest pain or DOE.  No other aggravating or relieving factors.  Sees Dr. Donnie Aho for history atrial fibrillation.  No other c/o.  The following portions of the patient's history were reviewed and updated as appropriate: allergies, current medications, past family history, past medical history, past social history, past surgical history and problem list.  Past Medical History  Diagnosis Date  . Hypertension   . ED (erectile dysfunction)   . CVA (cerebral infarction) 2009  . Atrial fibrillation     followed by Dr. Donnie Aho  . Hyperlipidemia   . Patent foramen ovale     history of  . Microscopic hematuria   . Elevated PSA     [facility], Dr. Aldean Ast;   . Cellulitis of face 11/08    [facility]    Review of Systems Constitutional: -fever, -chills, -sweats, -unexpected -weight change,-fatigue ENT: +runny nose, -ear pain, -sore throat Cardiology:  -chest pain, -palpitations, -edema Respiratory: +cough, +shortness of breath, +wheezing Gastroenterology: -abdominal pain, -nausea, -vomiting, -diarrhea, -constipation Hematology: -bleeding or bruising problems Musculoskeletal: -arthralgias, -myalgias, -joint swelling, -back pain Ophthalmology: -vision changes Urology: -dysuria, -difficulty urinating, -hematuria, -urinary frequency, -urgency Neurology: -headache, -weakness, -tingling, -numbness      Objective:   Physical Exam  Filed Vitals:   07/10/12 1522  BP: 128/80  Pulse: 74  Temp: 98.3 F (36.8 C)  Resp: 16    General appearance: alert, no distress, WD/WN  HEENT: normocephalic, sclerae anicteric, TMs pearly, nares patent, no discharge or erythema, pharynx normal Oral cavity: MMM, no  lesions Neck: supple, no lymphadenopathy, no thyromegaly, no masses Heart: occasional ectopic beats, otherwise RRR, normal S1, S2, no murmurs Lungs: few wheezes, few scattered rhonchi, no rales Pulses: 2+ Ext: no edema  Assessment and Plan :    Encounter Diagnoses  Name Primary?  . Bronchitis Yes  . Wheezing    Patient Instructions  Begin Prednisone steroid taper as labeled.  Begin Azithromycin antibiotic, one tablet daily for 3 days  Continue Albuterol inhaler, 2 puffs every 4-6 hours as needed for wheezing/cough spells.    If not much better by end of the week let me know.

## 2012-11-14 ENCOUNTER — Other Ambulatory Visit (HOSPITAL_COMMUNITY): Payer: Self-pay | Admitting: Urology

## 2012-11-14 DIAGNOSIS — R972 Elevated prostate specific antigen [PSA]: Secondary | ICD-10-CM

## 2012-12-01 ENCOUNTER — Ambulatory Visit (HOSPITAL_COMMUNITY)
Admission: RE | Admit: 2012-12-01 | Discharge: 2012-12-01 | Disposition: A | Discharge status: Home / Self Care | Payer: Medicare Other | Source: Ambulatory Visit | Attending: Urology | Admitting: Urology

## 2012-12-01 DIAGNOSIS — R972 Elevated prostate specific antigen [PSA]: Secondary | ICD-10-CM | POA: Insufficient documentation

## 2012-12-01 DIAGNOSIS — N400 Enlarged prostate without lower urinary tract symptoms: Secondary | ICD-10-CM | POA: Insufficient documentation

## 2012-12-01 DIAGNOSIS — N320 Bladder-neck obstruction: Secondary | ICD-10-CM | POA: Insufficient documentation

## 2012-12-01 LAB — CREATININE, SERUM
Creatinine, Ser: 1 mg/dL (ref 0.50–1.35)
GFR calc non Af Amer: 71 mL/min — ABNORMAL LOW (ref >90)

## 2012-12-01 MED ORDER — GADOBENATE DIMEGLUMINE 529 MG/ML IV SOLN
15.0000 mL | Freq: Once | INTRAVENOUS | Status: AC | PRN
  Administered 2012-12-01: 13 mL via INTRAVENOUS

## 2013-02-09 ENCOUNTER — Encounter: Payer: Self-pay | Admitting: Cardiology

## 2013-02-09 DIAGNOSIS — Z9889 Other specified postprocedural states: Secondary | ICD-10-CM | POA: Insufficient documentation

## 2013-02-09 DIAGNOSIS — Z8673 Personal history of transient ischemic attack (TIA), and cerebral infarction without residual deficits: Secondary | ICD-10-CM | POA: Insufficient documentation

## 2013-02-09 NOTE — Progress Notes (Signed)
Patient ID: Jack [name], male   DOB: [birth year], 79 y.o.   MRN: [id number]  Jack [name], Jack [name]  Date of visit:  02/09/2013 DOB:  1933/08/20    Age:  79 yrs. Medical record number:  [id number]     Account number:  [id number] Primary Care Provider: Sharlot Gowda C ____________________________ CURRENT DIAGNOSES  1. Arrhythmia-Atrial Fibrillation  2. Surgery-S/P Mitral valve Repair  3. Hyperlipidemia  4. Hypertension-Essential (Benign)  5. Mitral Valve Disorder  6. Unspecified Late Effects Of Cerebrovascular Disease ____________________________ ALLERGIES  No Known Drug Allergies ____________________________ MEDICATIONS  1. finasteride 5 mg Tablet, 1 p.o. daily  2. Diovan HCT 320-12.5 mg tablet, 1 p.o. daily  3. Viagra 100 mg tablet, Take as directed  4. amlodipine 5 mg tablet, 1 p.o. daily  5. aspirin 81 mg tablet,chewable, 1 p.o. daily  6. amoxicillin 500 mg capsule, take 4 1 hour befor dental work or surgery ____________________________ HISTORY OF PRESENT ILLNESS  Patient seen for cardiac followup. He has been doing well since he was here. He has had no recurrence of atrial fibrillation.Marland Kitchen He denies angina and has no PND, orthopnea, syncope, palpitations, or claudication. He has had no neurologic side effects. 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 Cornelius Moras;  Cardiology Procedures-Noninvasive:  echocardiogram July 2007, TEE June 2009, cardiac CT June 2009, event monitor June 2009, echocardiogram August 2011, echocardiogram October 2013, event monitor November 2013;  Cardiac Cath Results:   no significant disease, severe mitral regurgitation, pulmonary hypertension;  LVEF of 70% documented via echocardiogram on 02/11/2010,  CHADS Score:  4,  CHA2DS2-VASC Score:  5 ____________________________ CARDIO-PULMONARY TEST DATES EKG Date:  08/04/2012;   Cardiac Cath Date:  02/09/2006;  CABG: 02/11/2006;  Holter/Event Monitor Date: 05/12/2012;  Echocardiography Date: 05/10/2012;  CT Scan Date:  12/25/2007   ____________________________ FAMILY HISTORY Brother -- Brother alive and well Brother -- Bowel cancer, Deceased Brother -- Brother alive and well Father -- Coronary Artery Disease, Deceased Mother -- Unknown Disease, Deceased Sister -- Blood disorder, Deceased ____________________________ SOCIAL HISTORY Alcohol Use:  no alcohol use;  Smoking:  never smoked;  Diet:  low fat Diet;  Lifestyle:  married;  Exercise:  walking;  Occupation:  retired, [employer] and now drives for [employer];  Residence:  lives with wife;   ____________________________ REVIEW OF SYSTEMS General:  feels well, no change in exercise tolerance. Eyes: wears eye glasses/contact lenses, denies diplopia, glaucoma or visual field defects. Ears, Nose, Throat, Mouth:  partial hearing loss, hearing aides Respiratory: denies dyspnea, cough, wheezing or hemoptysis. Cardiovascular:  please review HPI Abdominal: denies dyspepsia, GI bleeding, constipation, or diarrhea Genitourinary-Male: erectile dysfunction  Musculoskeletal:  no arthritis ____________________________ PHYSICAL EXAMINATION VITAL SIGNS  Blood Pressure:  124/80 Sitting, Left arm, regular cuff   Pulse:  100/min. Weight:  144.00 lbs. Height:  64"BMI: 25  Constitutional:  pleasant white male in no acute distress, looks stated age Skin:  no apparent skin lesions Head:  normocephalic, balding male hair pattern ENT:  hearing aide present right ear Neck:  supple, no masses, thyromegaly, JVD. Carotid pulses are full and equal bilaterally without  bruits. Chest:  normal symmetry, clear to auscultation and percussion., healed median sternotomy scar  Cardiac:  normal S1 and S2, no S3 or S4, no murmur, occasional premature beats Abdomen:  ventral hernia present Peripheral Pulses:  the femoral,dorsalis pedis, and posterior tibial pulses are full and equal bilaterally with no bruits auscultated. Extremities & Back:  normal muscle strength and tone., mild bilateral venous insufficiency changes present, no edema present Neurological:  no gross motor or sensory deficits noted, affect appropriate, oriented x3. ____________________________ MOST RECENT LIPID PANEL 07/30/11  CHOL TOTL 165 mg/dl, LDL 409 calc, HDL 28 mg/dl, TRIGLYCER 811 mg/dl and CHOL/HDL 6.0 (Calc) ____________________________ IMPRESSIONS/PLAN  1. Previous mitral valve repair and maze procedure asymptomatic 2. History of atrial fibrillation currently maintaining sinus rhythm 3. Small aortic root RA fistula 4. Hypertension  Recommendations:  Clinically doing well at this time without recurrent atrial fibrillation or mitral regurgitation. Blood pressure is currently controlled. Recommended followup in 6 months with EKG. ____________________________ TODAYS ORDERS  1. Return Visit: 6 months  2. 12 Lead EKG: 6 months                       ____________________________ Cardiology Physician:  Darden Palmer MD [facility]

## 2013-08-02 ENCOUNTER — Encounter (HOSPITAL_COMMUNITY): Payer: Self-pay | Admitting: Emergency Medicine

## 2013-08-02 ENCOUNTER — Inpatient Hospital Stay (HOSPITAL_COMMUNITY)
Admission: EM | Admit: 2013-08-02 | Discharge: 2013-08-07 | DRG: 064 | Disposition: A | Discharge status: Rehabilitation Facility | Payer: Medicare Other | Attending: Neurology | Admitting: Neurology

## 2013-08-02 ENCOUNTER — Emergency Department (HOSPITAL_COMMUNITY): Payer: Medicare Other

## 2013-08-02 DIAGNOSIS — Z79899 Other long term (current) drug therapy: Secondary | ICD-10-CM

## 2013-08-02 DIAGNOSIS — G8190 Hemiplegia, unspecified affecting unspecified side: Secondary | ICD-10-CM | POA: Diagnosis present

## 2013-08-02 DIAGNOSIS — Z7982 Long term (current) use of aspirin: Secondary | ICD-10-CM

## 2013-08-02 DIAGNOSIS — I6389 Other cerebral infarction: Secondary | ICD-10-CM | POA: Diagnosis present

## 2013-08-02 DIAGNOSIS — N400 Enlarged prostate without lower urinary tract symptoms: Secondary | ICD-10-CM | POA: Diagnosis present

## 2013-08-02 DIAGNOSIS — I10 Essential (primary) hypertension: Secondary | ICD-10-CM | POA: Diagnosis present

## 2013-08-02 DIAGNOSIS — G936 Cerebral edema: Secondary | ICD-10-CM | POA: Diagnosis present

## 2013-08-02 DIAGNOSIS — E785 Hyperlipidemia, unspecified: Secondary | ICD-10-CM | POA: Diagnosis present

## 2013-08-02 DIAGNOSIS — I4891 Unspecified atrial fibrillation: Secondary | ICD-10-CM | POA: Diagnosis present

## 2013-08-02 DIAGNOSIS — I6350 Cerebral infarction due to unspecified occlusion or stenosis of unspecified cerebral artery: Secondary | ICD-10-CM

## 2013-08-02 DIAGNOSIS — R29810 Facial weakness: Secondary | ICD-10-CM | POA: Diagnosis present

## 2013-08-02 DIAGNOSIS — R471 Dysarthria and anarthria: Secondary | ICD-10-CM | POA: Diagnosis present

## 2013-08-02 DIAGNOSIS — Z8673 Personal history of transient ischemic attack (TIA), and cerebral infarction without residual deficits: Secondary | ICD-10-CM

## 2013-08-02 DIAGNOSIS — I619 Nontraumatic intracerebral hemorrhage, unspecified: Secondary | ICD-10-CM | POA: Diagnosis present

## 2013-08-02 DIAGNOSIS — R1312 Dysphagia, oropharyngeal phase: Secondary | ICD-10-CM | POA: Diagnosis present

## 2013-08-02 LAB — URINALYSIS, ROUTINE W REFLEX MICROSCOPIC
BILIRUBIN URINE: NEGATIVE
GLUCOSE, UA: NEGATIVE mg/dL
HGB URINE DIPSTICK: NEGATIVE
Ketones, ur: NEGATIVE mg/dL
Leukocytes, UA: NEGATIVE
Nitrite: NEGATIVE
PROTEIN: NEGATIVE mg/dL
Specific Gravity, Urine: 1.014 (ref 1.005–1.030)
UROBILINOGEN UA: 0.2 mg/dL (ref 0.0–1.0)
pH: 7.5 (ref 5.0–8.0)

## 2013-08-02 LAB — CBC
HEMATOCRIT: 43.2 % (ref 39.0–52.0)
Hemoglobin: 15 g/dL (ref 13.0–17.0)
MCH: 30.4 pg (ref 26.0–34.0)
MCHC: 34.7 g/dL (ref 30.0–36.0)
MCV: 87.4 fL (ref 78.0–100.0)
Platelets: 355 10*3/uL (ref 150–400)
RBC: 4.94 MIL/uL (ref 4.22–5.81)
RDW: 13.7 % (ref 11.5–15.5)
WBC: 11.3 10*3/uL — ABNORMAL HIGH (ref 4.0–10.5)

## 2013-08-02 LAB — POCT I-STAT, CHEM 8
BUN: 15 mg/dL (ref 6–23)
CALCIUM ION: 1.21 mmol/L (ref 1.13–1.30)
Chloride: 99 mEq/L (ref 96–112)
Creatinine, Ser: 0.9 mg/dL (ref 0.50–1.35)
GLUCOSE: 137 mg/dL — AB (ref 70–99)
HCT: 45 % (ref 39.0–52.0)
Hemoglobin: 15.3 g/dL (ref 13.0–17.0)
Potassium: 3.4 mEq/L — ABNORMAL LOW (ref 3.7–5.3)
Sodium: 139 mEq/L (ref 137–147)
TCO2: 28 mmol/L (ref 0–100)

## 2013-08-02 LAB — COMPREHENSIVE METABOLIC PANEL
ALBUMIN: 4 g/dL (ref 3.5–5.2)
ALT: 13 U/L (ref 0–53)
AST: 20 U/L (ref 0–37)
Alkaline Phosphatase: 42 U/L (ref 39–117)
BUN: 15 mg/dL (ref 6–23)
CALCIUM: 9.5 mg/dL (ref 8.4–10.5)
CHLORIDE: 98 meq/L (ref 96–112)
CO2: 28 mEq/L (ref 19–32)
CREATININE: 0.81 mg/dL (ref 0.50–1.35)
GFR calc Af Amer: >90 mL/min (ref >90)
GFR calc non Af Amer: 84 mL/min — ABNORMAL LOW (ref >90)
Glucose, Bld: 115 mg/dL — ABNORMAL HIGH (ref 70–99)
Potassium: 3.8 mEq/L (ref 3.7–5.3)
Sodium: 141 mEq/L (ref 137–147)
TOTAL PROTEIN: 7.6 g/dL (ref 6.0–8.3)
Total Bilirubin: 0.4 mg/dL (ref 0.3–1.2)

## 2013-08-02 LAB — RAPID URINE DRUG SCREEN, HOSP PERFORMED
AMPHETAMINES: NOT DETECTED
BENZODIAZEPINES: NOT DETECTED
Barbiturates: NOT DETECTED
COCAINE: NOT DETECTED
OPIATES: NOT DETECTED
TETRAHYDROCANNABINOL: NOT DETECTED

## 2013-08-02 LAB — DIFFERENTIAL
BASOS ABS: 0.1 10*3/uL (ref 0.0–0.1)
BASOS PCT: 0 % (ref 0–1)
EOS ABS: 0.2 10*3/uL (ref 0.0–0.7)
EOS PCT: 2 % (ref 0–5)
Lymphocytes Relative: 38 % (ref 12–46)
Lymphs Abs: 4.2 10*3/uL — ABNORMAL HIGH (ref 0.7–4.0)
MONO ABS: 1 10*3/uL (ref 0.1–1.0)
Monocytes Relative: 9 % (ref 3–12)
NEUTROS ABS: 5.8 10*3/uL (ref 1.7–7.7)
Neutrophils Relative %: 52 % (ref 43–77)

## 2013-08-02 LAB — ETHANOL

## 2013-08-02 LAB — APTT: APTT: 28 s (ref 24–37)

## 2013-08-02 LAB — PROTIME-INR
INR: 1 (ref 0.00–1.49)
Prothrombin Time: 13 seconds (ref 11.6–15.2)

## 2013-08-02 LAB — POCT I-STAT TROPONIN I: TROPONIN I, POC: 0.01 ng/mL (ref 0.00–0.08)

## 2013-08-02 LAB — GLUCOSE, CAPILLARY: Glucose-Capillary: 105 mg/dL — ABNORMAL HIGH (ref 70–99)

## 2013-08-02 LAB — MRSA PCR SCREENING: MRSA by PCR: NEGATIVE

## 2013-08-02 LAB — TROPONIN I

## 2013-08-02 MED ORDER — ACETAMINOPHEN 325 MG PO TABS
650.0000 mg | ORAL_TABLET | ORAL | Status: DC | PRN
Start: 2013-08-02 — End: 2013-08-07

## 2013-08-02 MED ORDER — ONDANSETRON HCL 4 MG/2ML IJ SOLN
4.0000 mg | Freq: Once | INTRAMUSCULAR | Status: AC
  Administered 2013-08-02: 4 mg via INTRAVENOUS

## 2013-08-02 MED ORDER — FINASTERIDE 5 MG PO TABS
5.0000 mg | ORAL_TABLET | Freq: Every day | ORAL | Status: DC
  Administered 2013-08-03 – 2013-08-07 (×5): 5 mg via ORAL
  Filled 2013-08-02 (×6): qty 1

## 2013-08-02 MED ORDER — SENNOSIDES-DOCUSATE SODIUM 8.6-50 MG PO TABS
1.0000 | ORAL_TABLET | Freq: Two times a day (BID) | ORAL | Status: DC
  Administered 2013-08-02 – 2013-08-07 (×9): 1 via ORAL
  Filled 2013-08-02 (×12): qty 1

## 2013-08-02 MED ORDER — STUDY - SODIUM CHLORIDE 0.9% IV SOLN
1.0000 mg/h | Status: DC
  Filled 2013-08-02: qty 40

## 2013-08-02 MED ORDER — NICARDIPINE HCL IN NACL 20-0.86 MG/200ML-% IV SOLN
3.0000 mg/h | Freq: Once | INTRAVENOUS | Status: AC
  Administered 2013-08-02: 3 mg/h via INTRAVENOUS
  Filled 2013-08-02: qty 200

## 2013-08-02 MED ORDER — ONDANSETRON HCL 4 MG/2ML IJ SOLN
INTRAMUSCULAR | Status: AC
  Filled 2013-08-02: qty 2

## 2013-08-02 MED ORDER — LABETALOL HCL 5 MG/ML IV SOLN: 5.0000 mg | INTRAVENOUS | Status: DC | PRN

## 2013-08-02 MED ORDER — PANTOPRAZOLE SODIUM 40 MG IV SOLR
40.0000 mg | Freq: Every day | INTRAVENOUS | Status: DC
  Administered 2013-08-02: 40 mg via INTRAVENOUS
  Filled 2013-08-02 (×2): qty 40

## 2013-08-02 MED ORDER — LABETALOL HCL 5 MG/ML IV SOLN
10.0000 mg | Freq: Once | INTRAVENOUS | Status: AC
  Administered 2013-08-02: 10 mg via INTRAVENOUS

## 2013-08-02 MED ORDER — ACETAMINOPHEN 650 MG RE SUPP: 650.0000 mg | RECTAL | Status: DC | PRN

## 2013-08-02 MED ORDER — LABETALOL HCL 5 MG/ML IV SOLN: 10.0000 mg | INTRAVENOUS | Status: DC | PRN

## 2013-08-02 MED ORDER — ONDANSETRON HCL 4 MG/2ML IJ SOLN
4.0000 mg | Freq: Once | INTRAMUSCULAR | Status: AC
  Administered 2013-08-02: 4 mg via INTRAVENOUS
  Filled 2013-08-02: qty 2

## 2013-08-02 MED ORDER — SODIUM CHLORIDE 0.9 % IV SOLN
Freq: Once | INTRAVENOUS | Status: AC
  Administered 2013-08-02: 11:00:00 via INTRAVENOUS

## 2013-08-02 MED ORDER — LABETALOL HCL 5 MG/ML IV SOLN
INTRAVENOUS | Status: AC
  Filled 2013-08-02: qty 4

## 2013-08-02 NOTE — ED Notes (Signed)
Dr. Leonie Man at the bedside

## 2013-08-02 NOTE — ED Notes (Signed)
Per [facility], pt code stroke for onset of slurred speech and left sided weakness to arm and leg at 0945. 20g to RAC. No gaze. Hx of Afib and HTN but SR on the monitor.

## 2013-08-02 NOTE — Progress Notes (Signed)
Pt requesting water. Noted swallow eval passed. Pt given sips of water in high fowlers position, noted increase in cough. Able to clear throat, speech clear. Attempt water again, noted pt to cough again and become teary eyed. Pt states that he "eats whatever he wants at home". Ask pt about coughing. "I cough all the time after I drink and eat something every since my last stroke". Informed pt that this nurse will hold on oral intake and order speech to come see the pt in Am. Pt verbalized understanding. Will continue to monitor.

## 2013-08-02 NOTE — H&P (Addendum)
Neurology H&P Reason for Consult: Hemorrhage  CC: left sided weakness  History is obtained from: patient, ems  HPI: Jack [name] is a 80 y.o. male with a history of afib, previous stroke not on anticoagulation who presents with sudden onset left sided weakness and numbness that started at 9:45 this morning. He was in his normal state prior to this. HE was drving when he noticed the left sided weakness.   He denies headaceh but has had some nausea.    LKW: 9:45am tpa given?: no, hemorrhage.     ROS: A 14 point ROS was performed and is negative except as noted in the HPI.  Past Medical History  Diagnosis Date  . Hypertension   . ED (erectile dysfunction)   . CVA (cerebral infarction) 2009  . Atrial fibrillation     followed by Dr. Wynonia Lawman  . Hyperlipidemia   . Patent foramen ovale     history of  . Microscopic hematuria   . Elevated PSA     [facility], Dr. Serita Butcher;   . Cellulitis of face 11/08    [facility]    Family History: No history of hemorrhage  Social History: Tob: denies  Exam: Current vital signs: BP 149/67  Temp(Src) 98.5 F (36.9 C) (Oral)  Resp 18  Ht 5\' 3"  (1.6 m)  Wt 65.772 kg (145 lb)  BMI 25.69 kg/m2  SpO2 92% Vital signs in last 24 hours: Temp:  [98.5 F (36.9 C)] 98.5 F (36.9 C) (01/22 1104) Resp:  [18] 18 (01/22 1047) BP: (149-181)/(67-84) 149/67 mmHg (01/22 1047) SpO2:  [92 %-100 %] 92 % (01/22 1047) Weight:  [65.772 kg (145 lb)] 65.772 kg (145 lb) (01/22 1032)  General: in bed ,nad CV: irregular Abd: ventral hernia, nt, nd Ext: no edema.  Mental Status: Patient is awake, alert, oriented to person, place, month, year, and situation. Immediate and remote memory are intact. Patient is able to give a clear and coherent history. No signs of aphasia or neglect Cranial Nerves: II: Visual Fields are full. Pupils are equal, round, and reactive to light.   III,IV, VI: EOMI without ptosis or diploplia.  V: Facial sensation  is decreased on left VII: Facial movement is notable for left facial droop VIII: hearing is intact to voice X: Uvula elevates symmetrically XI: Shoulder shrug is symmetric. XII: tongue is midline without atrophy or fasciculations.  Motor: Tone is normal. Bulk is normal. 5/5 strength was present on the right, on the left he has 4/5 knee/ankle extension, 3/5 hip flexion in the right leg. In the arm he is 3/5 throughout.  Sensory: Sensation is markedly diminished throughout.  Cerebellar: FNF and HKS are intact on right.  Gait: Not assessed due to acute nature of evaluation and multiple medical monitors in ED setting.  I have reviewed labs in epic and the results pertinent to this consultation are: CBC 11.3  I have reviewed the images obtained: CT head - thalamic hemorrhage on the right.   Impression: 80 yo M with likely hypertensive hemorrhage in the right thalamus. He has atrial fibrillation, but not on any anti-coagulants. He was hypertensive on admission, controlled with labetalol.   Recommendations: 1) Enrolled in ATACH-II trial for BP control. 2) no antiplatelets or anticoagulants. 3) Admit to neuro-icu with frequent neuro-checks 4) will need PT/OT 5) continue home finasteride.    This patient is critically ill and at significant risk of neurological worsening, death and care requires constant monitoring of vital signs, hemodynamics,respiratory and cardiac  monitoring, neurological assessment, discussion with family, other specialists and medical decision making of high complexity. I spent 60 minutes of neurocritical care time  in the care of  this patient.  Roland Rack, MD [facility] [phone]  If 7pm- 7am, please page neurology on call at [phone].  08/03/2013  4:51 PM

## 2013-08-02 NOTE — Code Documentation (Signed)
Code stroke activated and patient arrived to [facility] via EMS.  Patient states he was at work, LSN 0945 when he developed sudden left side weakness, slurred speech and facial droop.  NIHSS 9, SBP 187 enroute with EMs, in ED SBP 195, 10mg  labetalol IV given SBP 149/70.  Patient vomited in ED,  4 mg zofran given for N/V.  CT scan +ive bleed, Dr. Leonie Man at bedside.

## 2013-08-02 NOTE — ED Notes (Signed)
Pt using the urinal with wife's assist

## 2013-08-02 NOTE — ED Provider Notes (Signed)
CSN: [id number]     Arrival date & time 08/02/13  1019 History   First MD Initiated Contact with Patient 08/02/13 1020     Chief Complaint  Patient presents with  . Code Stroke   (Consider location/radiation/quality/duration/timing/severity/associated sxs/prior Treatment) Patient is a 80 y.o. male presenting with Acute Neurological Problem. The history is provided by the patient and the EMS personnel.  Cerebrovascular Accident This is a new problem. The current episode started 1 to 2 hours ago. The problem occurs constantly. The problem has not changed since onset.Pertinent negatives include no chest pain, no abdominal pain, no headaches and no shortness of breath. Nothing aggravates the symptoms. Nothing relieves the symptoms. He has tried nothing for the symptoms. The treatment provided no relief.    Past Medical History  Diagnosis Date  . Hypertension   . ED (erectile dysfunction)   . CVA (cerebral infarction) 2009  . Atrial fibrillation     followed by Dr. Wynonia Lawman  . Hyperlipidemia   . Patent foramen ovale     history of  . Microscopic hematuria   . Elevated PSA     [facility], Dr. Serita Butcher;   . Cellulitis of face 11/08    [facility]   Past Surgical History  Procedure Laterality Date  . Mitral valve repair     No family history on file. History  Substance Use Topics  . Smoking status: Never Smoker   . Smokeless tobacco: Never Used  . Alcohol Use: No    Review of Systems  Constitutional: Negative for fever.  HENT: Negative for drooling and rhinorrhea.   Eyes: Negative for pain.  Respiratory: Negative for cough and shortness of breath.   Cardiovascular: Negative for chest pain and leg swelling.  Gastrointestinal: Negative for nausea, vomiting, abdominal pain and diarrhea.  Genitourinary: Negative for dysuria and hematuria.  Musculoskeletal: Negative for gait problem and neck pain.  Skin: Negative for color change.  Neurological: Negative for numbness and  headaches.       Balance disturbance.   Hematological: Negative for adenopathy.  Psychiatric/Behavioral: Negative for behavioral problems.  All other systems reviewed and are negative.    Allergies  Review of patient's allergies indicates no known allergies.  Home Medications   Current Outpatient Rx  Name  Route  Sig  Dispense  Refill  . EXPIRED: albuterol (PROAIR HFA) 108 (90 BASE) MCG/ACT inhaler   Inhalation   Inhale 2 puffs into the lungs every 6 (six) hours as needed for wheezing.   1 Inhaler   0   . albuterol (PROVENTIL HFA;VENTOLIN HFA) 108 (90 BASE) MCG/ACT inhaler   Inhalation   Inhale 2 puffs into the lungs every 6 (six) hours as needed for wheezing.   1 Inhaler   0   . amLODipine (NORVASC) 5 MG tablet   Oral   Take 5 mg by mouth daily.           Marland Kitchen aspirin 81 MG tablet   Oral   Take 81 mg by mouth daily.           Marland Kitchen EXPIRED: cetirizine (ZYRTEC ALLERGY) 10 MG tablet   Oral   Take 1 tablet (10 mg total) by mouth daily.   30 tablet   0   . EXPIRED: omeprazole (PRILOSEC) 20 MG capsule   Oral   Take 1 capsule (20 mg total) by mouth daily.   28 capsule   0   . predniSONE (DELTASONE) 10 MG tablet  6/5/4/3/2/1 taper   21 tablet   0   . sildenafil (VIAGRA) 50 MG tablet   Oral   Take 50 mg by mouth daily as needed.           . valsartan-hydrochlorothiazide (DIOVAN-HCT) 160-12.5 MG per tablet   Oral   Take 1 tablet by mouth daily.            BP 181/84  Resp 18  Ht 5\' 3"  (1.6 m)  Wt 145 lb (65.772 kg)  BMI 25.69 kg/m2  SpO2 100% Physical Exam  Nursing note and vitals reviewed. Constitutional: He is oriented to person, place, and time. He appears well-developed and well-nourished.  HENT:  Head: Normocephalic and atraumatic.  Right Ear: External ear normal.  Left Ear: External ear normal.  Nose: Nose normal.  Mouth/Throat: Oropharynx is clear and moist. No oropharyngeal exudate.  Eyes: Conjunctivae and EOM are normal. Pupils are  equal, round, and reactive to light.  Neck: Normal range of motion. Neck supple.  Cardiovascular: Normal rate, regular rhythm, normal heart sounds and intact distal pulses.  Exam reveals no gallop and no friction rub.   No murmur heard. Pulmonary/Chest: Effort normal and breath sounds normal. No respiratory distress. He has no wheezes.  Abdominal: Soft. Bowel sounds are normal. He exhibits no distension. There is no tenderness. There is no rebound and no guarding.  Large supraumbilical hernia which is at baseline per patient and soft to palpation.  Musculoskeletal: Normal range of motion. He exhibits no edema and no tenderness.  Neurological: He is alert and oriented to person, place, and time. A sensory deficit is present.  Decreased sensation in the left upper extremity and left lower extremity.  2/5 strength in LUE/LLE. 5/5 in RUE/RLE.   Normal finger to nose in RUE. Unable to perform finger to nose in LUE.   Skin: Skin is warm and dry.  Psychiatric: He has a normal mood and affect. His behavior is normal.    ED Course  Procedures (including critical care time) Labs Review Labs Reviewed  CBC - Abnormal; Notable for the following:    WBC 11.3 (*)    All other components within normal limits  DIFFERENTIAL - Abnormal; Notable for the following:    Lymphs Abs 4.2 (*)    All other components within normal limits  GLUCOSE, CAPILLARY - Abnormal; Notable for the following:    Glucose-Capillary 105 (*)    All other components within normal limits  ETHANOL  PROTIME-INR  APTT  COMPREHENSIVE METABOLIC PANEL  TROPONIN I  URINE RAPID DRUG SCREEN (HOSP PERFORMED)  URINALYSIS, ROUTINE W REFLEX MICROSCOPIC   Imaging Review Ct Head Wo Contrast  08/02/2013   CLINICAL DATA:  Slurred speech.  Left-sided arm and foot weakness.  EXAM: CT HEAD WITHOUT CONTRAST  TECHNIQUE: Contiguous axial images were obtained from the base of the skull through the vertex without intravenous contrast.  COMPARISON:   MRI brain 12/12/2007  FINDINGS: A hemorrhagic infarct is present in the posterolateral thalamus and posterior right lentiform nucleus. The more inferior and medial component measures 17 x 21 mm. A more superior and lateral hemorrhage in the posterior right lentiform nucleus measures 15 x 11 mm. There is some surrounding edema without significant midline shift.  And nonhemorrhagic left parietal lobe infarct is new since 2009. Periventricular and subcortical white matter change has progressed. No other acute cortical infarct or hemorrhage is present.  The paranasal sinuses and mastoid air cells are clear. The osseous skull is intact.  IMPRESSION: 1. Hemorrhagic infarcts involving the posterior right basal ganglia as described. 2. Interval nonhemorrhagic infarct of the left parietal lobe does not appear acute. 3. Progression of chronic microvascular ischemic changes. Critical Value/emergent results were called by telephone at the time of interpretation on 08/02/2013 at 10:38 AM to Dr. Leonel Ramsay, who verbally acknowledged these results.   Electronically Signed   By: Lawrence Santiago M.D.   On: 08/02/2013 10:38    EKG Interpretation    Date/Time:  Thursday August 02 2013 10:37:06 EST Ventricular Rate:  106 PR Interval:    QRS Duration: 85 QT Interval:  353 QTC Calculation: 469 R Axis:   -11 Text Interpretation:  Atrial fibrillation RSR' in V1 or V2, right VCD or RVH Confirmed by Ladaysha Soutar  MD, Marija Calamari ([id number]) on 08/02/2013 10:41:25 AM           CRITICAL CARE Performed by: Pamella Pert, S Total critical care time: 30 min Critical care time was exclusive of separately billable procedures and treating other patients. Critical care was necessary to treat or prevent imminent or life-threatening deterioration. Critical care was time spent personally by me on the following activities: development of treatment plan with patient and/or surrogate as well as nursing, discussions with consultants,  evaluation of patient's response to treatment, examination of patient, obtaining history from patient or surrogate, ordering and performing treatments and interventions, ordering and review of laboratory studies, ordering and review of radiographic studies, pulse oximetry and re-evaluation of patient's condition.  MDM   1. Acute hemorrhagic infarction of brain    10:41 AM 80 y.o. male who presents with sudden onset balance disturbance and left-sided weakness. EMS reports that his symptoms began at approximately 9:45 AM this morning while he was ambulating. He is afebrile and hypertensive here. He presented as a code stroke and was found to have hemorrhagic infarcts involving the posterior right basal ganglia on CT. Pt interactive and alert on exam. Plan on admission to neuro.   Pt had prolonged stay in the ER while awaiting bed. He required multiple evaluations while in the ED.     Blanchard Kelch, MD 08/03/13 1052

## 2013-08-03 ENCOUNTER — Inpatient Hospital Stay (HOSPITAL_COMMUNITY): Payer: Medicare Other

## 2013-08-03 DIAGNOSIS — I6350 Cerebral infarction due to unspecified occlusion or stenosis of unspecified cerebral artery: Secondary | ICD-10-CM

## 2013-08-03 DIAGNOSIS — I6330 Cerebral infarction due to thrombosis of unspecified cerebral artery: Secondary | ICD-10-CM

## 2013-08-03 LAB — PROTIME-INR
INR: 1.23 (ref 0.00–1.49)
PROTHROMBIN TIME: 15.2 s (ref 11.6–15.2)

## 2013-08-03 LAB — CBC
HCT: 36.6 % — ABNORMAL LOW (ref 39.0–52.0)
Hemoglobin: 12.5 g/dL — ABNORMAL LOW (ref 13.0–17.0)
MCH: 29.8 pg (ref 26.0–34.0)
MCHC: 34.2 g/dL (ref 30.0–36.0)
MCV: 87.1 fL (ref 78.0–100.0)
PLATELETS: 338 10*3/uL (ref 150–400)
RBC: 4.2 MIL/uL — ABNORMAL LOW (ref 4.22–5.81)
RDW: 13.7 % (ref 11.5–15.5)
WBC: 8.9 10*3/uL (ref 4.0–10.5)

## 2013-08-03 LAB — BASIC METABOLIC PANEL
BUN: 13 mg/dL (ref 6–23)
CALCIUM: 8.6 mg/dL (ref 8.4–10.5)
CO2: 26 meq/L (ref 19–32)
Chloride: 100 mEq/L (ref 96–112)
Creatinine, Ser: 0.83 mg/dL (ref 0.50–1.35)
GFR calc non Af Amer: 83 mL/min — ABNORMAL LOW (ref >90)
Glucose, Bld: 91 mg/dL (ref 70–99)
Potassium: 3.6 mEq/L — ABNORMAL LOW (ref 3.7–5.3)
SODIUM: 139 meq/L (ref 137–147)

## 2013-08-03 MED ORDER — RESOURCE THICKENUP CLEAR PO POWD
ORAL | Status: DC | PRN
  Filled 2013-08-03 (×2): qty 125

## 2013-08-03 MED ORDER — STARCH (THICKENING) PO POWD: ORAL | Status: DC | PRN

## 2013-08-03 MED ORDER — PANTOPRAZOLE SODIUM 40 MG PO TBEC
40.0000 mg | DELAYED_RELEASE_TABLET | Freq: Every day | ORAL | Status: DC
  Administered 2013-08-03 – 2013-08-07 (×5): 40 mg via ORAL
  Filled 2013-08-03 (×5): qty 1

## 2013-08-03 MED ORDER — AMLODIPINE BESYLATE 5 MG PO TABS
5.0000 mg | ORAL_TABLET | Freq: Every day | ORAL | Status: DC
  Administered 2013-08-03 – 2013-08-07 (×5): 5 mg via ORAL
  Filled 2013-08-03 (×5): qty 1

## 2013-08-03 MED ORDER — IRBESARTAN 150 MG PO TABS
150.0000 mg | ORAL_TABLET | Freq: Every day | ORAL | Status: DC
  Administered 2013-08-03 – 2013-08-07 (×5): 150 mg via ORAL
  Filled 2013-08-03 (×5): qty 1

## 2013-08-03 MED ORDER — POTASSIUM CHLORIDE IN NACL 20-0.9 MEQ/L-% IV SOLN
INTRAVENOUS | Status: DC
  Administered 2013-08-03 – 2013-08-05 (×3): via INTRAVENOUS
  Filled 2013-08-03 (×6): qty 1000

## 2013-08-03 MED ORDER — HYDROCHLOROTHIAZIDE 12.5 MG PO CAPS
12.5000 mg | ORAL_CAPSULE | Freq: Every day | ORAL | Status: DC
  Administered 2013-08-03 – 2013-08-07 (×5): 12.5 mg via ORAL
  Filled 2013-08-03 (×5): qty 1

## 2013-08-03 MED ORDER — PNEUMOCOCCAL VAC POLYVALENT 25 MCG/0.5ML IJ INJ
0.5000 mL | INJECTION | INTRAMUSCULAR | Status: AC
  Administered 2013-08-04: 0.5 mL via INTRAMUSCULAR
  Filled 2013-08-03: qty 0.5

## 2013-08-03 MED ORDER — VALSARTAN-HYDROCHLOROTHIAZIDE 160-12.5 MG PO TABS: 1.0000 | ORAL_TABLET | Freq: Every day | ORAL | Status: DC

## 2013-08-03 NOTE — Progress Notes (Signed)
Utilization review completed. Stuart Mirabile, RN, BSN. 

## 2013-08-03 NOTE — Evaluation (Signed)
Clinical/Bedside Swallow Evaluation Patient Details  Name: Jack [name] MRN: [id number] Date of Birth: 01-05-1934  Today's Date: 08/03/2013 Time: 9678-9381 SLP Time Calculation (min): 31 min  Past Medical History:  Past Medical History  Diagnosis Date  . Hypertension   . ED (erectile dysfunction)   . CVA (cerebral infarction) 2009  . Atrial fibrillation     followed by Dr. Wynonia Lawman  . Hyperlipidemia   . Patent foramen ovale     history of  . Microscopic hematuria   . Elevated PSA     [facility], Dr. Serita Butcher;   . Cellulitis of face 11/08    [facility]   Past Surgical History:  Past Surgical History  Procedure Laterality Date  . Mitral valve repair     HPI:  80 y.o. male with a history of afib, previous stroke not on anticoagulation who presents with sudden onset left sided weakness and numbness that started at 9:45 this morning. He was in his normal state prior to this. HE was drving when he noticed the left sided weakness.  PMH:  HTN, CVA, history of patent foramen ovale, cellulitis of face.  CT 1/22 Hemorrhagic infarcts involving the posterior right basal ganglia as described, Interval nonhemorrhagic infarct of the left parietal lobe does not appear acute, progression of chronic microvascular ischemic changes.   Assessment / Plan / Recommendation Clinical Impression  Pt. exhibited likely decreased laryngeal/tracheal protection evidenced by immediate and consistent coughs with thin liquid which was not present when liquid thickened to nectar.  Mild oral delayed transit with regular texture.  Provided education to pt. and wife regarding clinical reasoning for thick liquids and swallow precautions.  Recommend Dys 3 texture diet and nectar thick liquids, no straws, pills whole in applesauce and full assist, pt. to feed himself with assist.    Aspiration Risk  Moderate    Diet Recommendation Dysphagia 3 (Mechanical Soft);Nectar-thick liquid   Liquid Administration via:  Cup;No straw Medication Administration: Whole meds with puree Supervision: Patient able to self feed;Full supervision/cueing for compensatory strategies Compensations: Slow rate;Small sips/bites Postural Changes and/or Swallow Maneuvers: Seated upright 90 degrees    Other  Recommendations Oral Care Recommendations: Oral care BID   Follow Up Recommendations  Inpatient Rehab    Frequency and Duration min 2x/week  2 weeks   Pertinent Vitals/Pain WDL         Swallow Study         Oral/Motor/Sensory Function Overall Oral Motor/Sensory Function: Impaired Labial ROM: Within Functional Limits Labial Symmetry: Within Functional Limits Labial Strength: Within Functional Limits Lingual ROM: Within Functional Limits Lingual Symmetry: Abnormal symmetry left Facial ROM: Within Functional Limits Facial Symmetry: Within Functional Limits Facial Strength: Within Functional Limits   Ice Chips Ice chips: Not tested   Thin Liquid Thin Liquid: Impaired Presentation: Cup;Straw Pharyngeal  Phase Impairments: Cough - Immediate (with cup)    Nectar Thick Nectar Thick Liquid: Within functional limits   Honey Thick Honey Thick Liquid: Not tested   Puree Puree: Within functional limits   Solid   GO    Solid: Impaired Oral Phase Impairments:  (mild delays)       Cranford Mon.Ed [employer] [phone]  08/03/2013

## 2013-08-03 NOTE — Progress Notes (Signed)
Stroke Team Progress Note  HISTORY Jack [name] is a 80 y.o. male with a history of afib, previous stroke not on anticoagulation who presents with sudden onset left sided weakness and numbness that started at 9:45 this morning 08/02/2013. He was in his normal state prior to this. HE was drving when he noticed the left sided weakness. He denies headache but has had some nausea. CT showed right thalamic hemorrhage. His BP was 181/84 on arrival in the ED. He was enrolled in the ATACH-II trial for BP control. Patient was not administerd TPA secondary to hemorrhage. He was admitted to the neuro ICU for further evaluation and treatment.  SUBJECTIVE His RN is at the bedside.  Overall he feels his condition is stable. He wants something to eat.  OBJECTIVE Most recent Vital Signs: Filed Vitals:   08/03/13 0530 08/03/13 0600 08/03/13 0630 08/03/13 0700  BP: 137/57 164/98 161/80 145/70  Pulse: 36 38 33 38  Temp:      TempSrc:      Resp: 15 15 25 15   Height:      Weight:      SpO2: 97% 100% 98% 99%   CBG (last 3)   Recent Labs  08/02/13 1034  GLUCAP 105*    IV Fluid Intake:   . ATACH-II study - niCARdipine DOUBLE STRENGTH  40 mg/200 mL (0.2 mg/mL) -Standard treatment:  SBP range 140-180 (PI-Sethi)      MEDICATIONS  . finasteride  5 mg Oral Daily  . pantoprazole (PROTONIX) IV  40 mg Intravenous QHS  . senna-docusate  1 tablet Oral BID   PRN:  acetaminophen, acetaminophen, labetalol, labetalol  Diet:  NPO  Activity:  Bedrest DVT Prophylaxis:  SCDs   CLINICALLY SIGNIFICANT STUDIES Basic Metabolic Panel:  Recent Labs Lab 08/02/13 1021 08/02/13 1119 08/03/13 0220  NA 141 139 139  K 3.8 3.4* 3.6*  CL 98 99 100  CO2 28  --  26  GLUCOSE 115* 137* 91  BUN 15 15 13   CREATININE 0.81 0.90 0.83  CALCIUM 9.5  --  8.6   Liver Function Tests:  Recent Labs Lab 08/02/13 1021  AST 20  ALT 13  ALKPHOS 42  BILITOT 0.4  PROT 7.6  ALBUMIN 4.0   CBC:  Recent Labs Lab 08/02/13 1021  08/02/13 1119 08/03/13 0220  WBC 11.3*  --  8.9  NEUTROABS 5.8  --   --   HGB 15.0 15.3 12.5*  HCT 43.2 45.0 36.6*  MCV 87.4  --  87.1  PLT 355  --  338   Coagulation:  Recent Labs Lab 08/02/13 1021 08/03/13 0220  LABPROT 13.0 15.2  INR 1.00 1.23   Cardiac Enzymes:  Recent Labs Lab 08/02/13 1021  TROPONINI <0.30   Urinalysis:  Recent Labs Lab 08/02/13 1206  COLORURINE YELLOW  LABSPEC 1.014  PHURINE 7.5  GLUCOSEU NEGATIVE  HGBUR NEGATIVE  BILIRUBINUR NEGATIVE  KETONESUR NEGATIVE  PROTEINUR NEGATIVE  UROBILINOGEN 0.2  NITRITE NEGATIVE  LEUKOCYTESUR NEGATIVE   Lipid Panel    Component Value Date/Time   CHOL  Value: 128        ATP III CLASSIFICATION:  <200     mg/dL   Desirable  200-239  mg/dL   Borderline High  >=240    mg/dL   High 12/12/2007 0430   TRIG 94 12/12/2007 0430   HDL 21* 12/12/2007 0430   CHOLHDL 6.1 12/12/2007 0430   VLDL 19 12/12/2007 0430   LDLCALC  Value: 88  Total Cholesterol/HDL:CHD Risk Coronary Heart Disease Risk Table                     Men   Women  1/2 Average Risk   3.4   3.3 12/12/2007 0430   HgbA1C  Lab Results  Component Value Date   HGBA1C  Value: 5.5 (NOTE)   The ADA recommends the following therapeutic goals for glycemic   control related to Hgb A1C measurement:   Goal of Therapy:   < 7.0% Hgb A1C   Action Suggested:  > 8.0% Hgb A1C   Ref:  Diabetes Care, 22, Suppl. 1, 1999 12/12/2007    Urine Drug Screen:     Component Value Date/Time   LABOPIA NONE DETECTED 08/02/2013 1206   COCAINSCRNUR NONE DETECTED 08/02/2013 1206   LABBENZ NONE DETECTED 08/02/2013 1206   AMPHETMU NONE DETECTED 08/02/2013 1206   THCU NONE DETECTED 08/02/2013 1206   LABBARB NONE DETECTED 08/02/2013 1206    Alcohol Level:  Recent Labs Lab 08/02/13 1021  ETH <11    CT of the brain  08/02/2013    1. Hemorrhagic infarcts involving the posterior right basal ganglia as described. 2. Interval nonhemorrhagic infarct of the left parietal lobe does not appear acute. 3.  Progression of chronic microvascular ischemic changes.   MRI of the brain    MRA of the brain    2D Echocardiogram    Carotid Doppler    CXR    EKG  atrial fibrillation, rate 106. For complete results please see formal report.   Therapy Recommendations Pending  Physical Exam  General: The patient is alert and cooperative at the time of the examination.  Respiratory: Lung fields are clear.  Cardiovascular: Occasionally irregular rhythm, no murmurs  Abdomen: Positive bowel sounds, nontender  Skin: No significant peripheral edema is noted.   Neurologic Exam  Mental status: The patient is alert, fully cooperative.  Cranial nerves: Facial symmetry is present. Speech is dysarthric. Extraocular movements are full, visual fields are full. Decreased sensation on the left face as compared to the right to soft touch.  Motor: The patient has good strength on the right extremities. On the left, the patient has drift of the left arm to the bed. 3/5 strength on the left arm, 4/5 strength of the left leg  Sensory examination: Decreased soft touch sensation on the left leg as compared to the right, symmetric on the arms to  Coordination: The patient has good finger-nose-finger and heel-to-shin on the right arm and leg. On the left leg, the patient is able to perform toe to finger with some dysmetria. The patient has difficulty performing finger-nose-finger with the left arm.  Gait and station: The gait cannot be tested.  Reflexes: Deep tendon reflexes are symmetric.    ASSESSMENT [name] is a 80 y.o. male presenting with left sided weakness. Imaging confirms a right thalamic hemorrhage. Hemorrhage felt to be embolic secondary to accelerated hypertension with SBP 182 on arrival - BP was lowered using cardene. He was enrolled in the ATACH-II study for BP control.  On aspirin 81 mg orally every day prior to admission. Patient with resultant left hemiparesis, dysphagia. Work up  underway.  atrial fibrillation, not on anticoagulants prior to admission, not an anticoagulant candidate now due to hemorrhage Hypertension Hyperlipidemia, on no statin PTA Known PFO Hx stroke 2009   Hospital day # 1  TREATMENT/PLAN  Repeat CT this am to f/u hemorrhage  If CT stable, will  transfer out of the unit, mobilize patient  OOB. Therapy evals  ST for swallow due to coughing with liquids during the night  Burnetta Sabin, MSN, RN, ANVP-BC, ANP-BC, GNP-BC [facility] Pager: [phone] 08/03/2013 8:01 AM  I have personally obtained a history, examined the patient, evaluated imaging results, and formulated the assessment and plan of care. I agree with the above. Lenor Coffin

## 2013-08-03 NOTE — Progress Notes (Signed)
Rehab Admissions Coordinator Note:  Patient was screened by Retta Diones for appropriateness for an Inpatient Acute Rehab Consult.  At this time, we are recommending Inpatient Rehab consult.  Retta Diones 08/03/2013, 2:55 PM  I can be reached at [phone].

## 2013-08-03 NOTE — Consult Note (Signed)
Physical Medicine and Rehabilitation Consult Reason for Consult: Right thalamic hemorrhage Referring Physician: Dr. Leonie Man   HPI: Jack [name] is a 80 y.o. right-handed male with history of atrial fibrillation, hypertension, CVA 2009 not on anticoagulations and very little residual weakness. Admitted 08/02/2013 with left-sided weakness. Cranial CT scan showed 17 x 21 hemorrhagic infarcts posterior right basal ganglia that his systolic blood pressure [id number]. Patient did not receive TPA. Maintained on a dysphagia 3 nectar thick liquid diet. Follow cranial CT scan 08/03/2013 unchanged. Physical therapy evaluation completed 08/03/2013 with recommendations for physical medicine rehabilitation consult to consider inpatient rehabilitation services.   Review of Systems  Cardiovascular: Positive for palpitations.  Genitourinary:       Hematuria  Musculoskeletal: Positive for myalgias.  All other systems reviewed and are negative.   Past Medical History  Diagnosis Date  . Hypertension   . ED (erectile dysfunction)   . CVA (cerebral infarction) 2009  . Atrial fibrillation     followed by Dr. Wynonia Lawman  . Hyperlipidemia   . Patent foramen ovale     history of  . Microscopic hematuria   . Elevated PSA     [facility], Dr. Serita Butcher;   . Cellulitis of face 11/08    [facility]   Past Surgical History  Procedure Laterality Date  . Mitral valve repair     No family history on file. Social History:  reports that he has never smoked. He has never used smokeless tobacco. He reports that he does not drink alcohol or use illicit drugs. Allergies: No Known Allergies Medications Prior to Admission  Medication Sig Dispense Refill  . amLODipine (NORVASC) 5 MG tablet Take 5 mg by mouth daily.        . finasteride (PROSCAR) 5 MG tablet Take 5 mg by mouth daily.      . sildenafil (VIAGRA) 50 MG tablet Take 50 mg by mouth daily as needed.        . valsartan-hydrochlorothiazide (DIOVAN-HCT) 160-12.5  MG per tablet Take 1 tablet by mouth daily.        . [DISCONTINUED] aspirin 81 MG tablet Take 81 mg by mouth daily.          Home: Home Living Family/patient expects to be discharged to:: Private residence Living Arrangements: Spouse/significant other Available Help at Discharge: Family;Available 24 hours/day Type of Home: House Home Access: Stairs to enter [employer] of Steps: 6 Home Layout: Two level;Bed/bath upstairs;Other (Comment) (wife reports they can make arrangements for first floor bed) Alternate Level Stairs-Number of Steps: flight Alternate Level Stairs-Rails: Can reach both Home Equipment: None Additional Comments: wife present to confirm PLOF and home setup; pt exercised weely prior to admission  Functional History: Prior Function Comments: pt driving and completely independent with ADLs Functional Status:  Mobility:     Ambulation/Gait General Gait Details: not assessed; recommend 2 person for safety     ADL:    Cognition: Cognition Overall Cognitive Status: Impaired/Different from baseline Orientation Level: Oriented X4 Cognition Arousal/Alertness: Awake/alert Behavior During Therapy: Impulsive Overall Cognitive Status: Impaired/Different from baseline Area of Impairment: Following commands;Safety/judgement;Problem solving Following Commands: Follows one step commands consistently;Follows one step commands with increased time Safety/Judgement: Decreased awareness of deficits;Decreased awareness of safety Problem Solving: Difficulty sequencing;Slow processing;Requires verbal cues;Requires tactile cues  Blood pressure 133/57, pulse 40, temperature 98.5 F (36.9 C), temperature source Oral, resp. rate 15, height 5\' 3"  (1.6 m), weight 65.772 kg (145 lb), SpO2 95.00%. Physical Exam  Vitals reviewed. Constitutional: He is  oriented to person, place, and time.  Eyes:  Pupils reactive to light  Neck: Normal range of motion. Neck supple. No  thyromegaly present.  Cardiovascular:  Rate controlled  Respiratory: Effort normal and breath sounds normal. No respiratory distress.  GI: Soft. Bowel sounds are normal. He exhibits no distension.  Neurological: He is alert and oriented to person, place, and time.  Flat affect.Follows commands. Speech is mildly dysarthric but intelligible. Decreased oromotor control. Masked facies, slow to process and respond. LUE is grossly 2+ to 3 but inconsistent. LLE is similar at 2 to 3. Senses pain in all 4. Limited insight and awareness.   Skin: Skin is warm and dry.    Results for orders placed during the hospital encounter of 08/02/13 (from the past 24 hour(s))  MRSA PCR SCREENING     Status: None   Collection Time    08/02/13  3:02 PM      Result Value Range   MRSA by PCR NEGATIVE  NEGATIVE  CBC     Status: Abnormal   Collection Time    08/03/13  2:20 AM      Result Value Range   WBC 8.9  4.0 - 10.5 K/uL   RBC 4.20 (*) 4.22 - 5.81 MIL/uL   Hemoglobin 12.5 (*) 13.0 - 17.0 g/dL   HCT 36.6 (*) 39.0 - 52.0 %   MCV 87.1  78.0 - 100.0 fL   MCH 29.8  26.0 - 34.0 pg   MCHC 34.2  30.0 - 36.0 g/dL   RDW 13.7  11.5 - 15.5 %   Platelets 338  150 - 400 K/uL  BASIC METABOLIC PANEL     Status: Abnormal   Collection Time    08/03/13  2:20 AM      Result Value Range   Sodium 139  137 - 147 mEq/L   Potassium 3.6 (*) 3.7 - 5.3 mEq/L   Chloride 100  96 - 112 mEq/L   CO2 26  19 - 32 mEq/L   Glucose, Bld 91  70 - 99 mg/dL   BUN 13  6 - 23 mg/dL   Creatinine, Ser 0.83  0.50 - 1.35 mg/dL   Calcium 8.6  8.4 - 10.5 mg/dL   GFR calc non Af Amer 83 (*) >90 mL/min   GFR calc Af Amer >90  >90 mL/min  PROTIME-INR     Status: None   Collection Time    08/03/13  2:20 AM      Result Value Range   Prothrombin Time 15.2  11.6 - 15.2 seconds   INR 1.23  0.00 - 1.49   Ct Head Wo Contrast  08/03/2013   CLINICAL DATA:  Stroke, 24 hr follow-up  EXAM: CT HEAD WITHOUT CONTRAST  TECHNIQUE: Contiguous axial images  were obtained from the base of the skull through the vertex without intravenous contrast.  COMPARISON:  08/02/2013  FINDINGS: The bilobed hemorrhage within the posterior aspect of the right basal ganglia appears grossly unchanged with dominant more posteriorly is located hemorrhage measuring approximately 1.9 x 1.4 cm (image 18, series 2, previously, 2.1 x 1.7 cm. The more anteriorly located component of the hemorrhage measures approximately 1.3 x 1.2 cm, previously, 1.3 x 1.1 cm. The amount of adjacent vasogenic edema is grossly unchanged. No definite midline shift. There is no definitive extension of the parenchymal hemorrhage into the ventricular system with attention paid to the dependent portion of the occipital horn of the right lateral ventricle.  Persistent findings of advanced  microvascular ischemic disease with extensive periventricular hypodensities. Grossly unchanged appearance of old left-sided posterior parietal infarct (representative images 17-19, series 2). No CT evidence of acute superimposed large territory infarct. No definite intraparenchymal or extra-axial mass. No definite extra-axial hemorrhage.  A small air-fluid level is noted within in the left sphenoid sinus. The remaining paranasal sinuses and mastoid air cells are normally aerated. Regional soft tissues appear normal.  IMPRESSION: 1. Grossly unchanged appearance of bilobed hemorrhage within the posterior aspect of the right basal ganglia. There is no definitive extension of this intraparenchymal hemorrhage into the ventricular system. Grossly unchanged minimal amount of adjacent vasogenic edema without associated midline shift. 2. Unchanged old left-sided posterior parietal infarct. 3. Persistent findings of advanced microvascular ischemic disease.   Electronically Signed   By: Sandi Mariscal M.D.   On: 08/03/2013 09:35   Ct Head Wo Contrast  08/02/2013   CLINICAL DATA:  Slurred speech.  Left-sided arm and foot weakness.  EXAM: CT HEAD  WITHOUT CONTRAST  TECHNIQUE: Contiguous axial images were obtained from the base of the skull through the vertex without intravenous contrast.  COMPARISON:  MRI brain 12/12/2007  FINDINGS: A hemorrhagic infarct is present in the posterolateral thalamus and posterior right lentiform nucleus. The more inferior and medial component measures 17 x 21 mm. A more superior and lateral hemorrhage in the posterior right lentiform nucleus measures 15 x 11 mm. There is some surrounding edema without significant midline shift.  And nonhemorrhagic left parietal lobe infarct is new since 2009. Periventricular and subcortical white matter change has progressed. No other acute cortical infarct or hemorrhage is present.  The paranasal sinuses and mastoid air cells are clear. The osseous skull is intact.  IMPRESSION: 1. Hemorrhagic infarcts involving the posterior right basal ganglia as described. 2. Interval nonhemorrhagic infarct of the left parietal lobe does not appear acute. 3. Progression of chronic microvascular ischemic changes. Critical Value/emergent results were called by telephone at the time of interpretation on 08/02/2013 at 10:38 AM to Dr. Leonel Ramsay, who verbally acknowledged these results.   Electronically Signed   By: Lawrence Santiago M.D.   On: 08/02/2013 10:38    Assessment/Plan: Diagnosis: hemorrhagic right basal ganglia infarcts 1. Does the need for close, 24 hr/day medical supervision in concert with the patient's rehab needs make it unreasonable for this patient to be served in a less intensive setting? Yes 2. Co-Morbidities requiring supervision/potential complications: htn, bph, afib 3. Due to bladder management, bowel management, safety, skin/wound care, disease management, medication administration, pain management and patient education, does the patient require 24 hr/day rehab nursing? Yes 4. Does the patient require coordinated care of a physician, rehab nurse, PT (1-2 hrs/day, 5 days/week), OT (1-2  hrs/day, 5 days/week) and SLP (1-2 hrs/day, 5 days/week) to address physical and functional deficits in the context of the above medical diagnosis(es)? Yes Addressing deficits in the following areas: balance, endurance, locomotion, strength, transferring, bowel/bladder control, bathing, dressing, feeding, grooming, toileting, cognition, speech, language, swallowing and psychosocial support 5. Can the patient actively participate in an intensive therapy program of at least 3 hrs of therapy per day at least 5 days per week? Yes 6. The potential for patient to make measurable gains while on inpatient rehab is good 7. Anticipated functional outcomes upon discharge from inpatient rehab are supervision with PT, supervision with OT, supervision to min assist with SLP. 8. Estimated rehab length of stay to reach the above functional goals is: 13-18 days 9. Does the patient have adequate social supports  to accommodate these discharge functional goals? Yes and Potentially 10. Anticipated D/C setting: Home 11. Anticipated post D/C treatments: HH therapy--->outpt therapies 12. Overall Rehab/Functional Prognosis: good  RECOMMENDATIONS: This patient's condition is appropriate for continued rehabilitative care in the following setting: CIR Patient has agreed to participate in recommended program. Yes Note that insurance prior authorization may be required for reimbursement for recommended care.  Comment: Rehab Admissions Coordinator to follow up.  Thanks,  Meredith Staggers, MD, Mellody Drown     08/03/2013

## 2013-08-03 NOTE — Evaluation (Addendum)
Physical Therapy Evaluation Patient Details Name: Jack [name] MRN: [id number] DOB: 10-07-1933 Today's Date: 08/03/2013 Time: 0263-7858 PT Time Calculation (min): 25 min  PT Assessment / Plan / Recommendation History of Present Illness  80 y.o. male with a history of afib, previous stroke not on anticoagulation who presents with sudden onset left sided weakness and numbness that started at 9:45 this morning. He was in his normal state prior to this. HE was drving when he noticed the left sided weakness.  PMH:  HTN, CVA, history of patent foramen ovale, cellulitis of face.  CT 1/22 Hemorrhagic infarcts involving the posterior right basal ganglia as described, Interval nonhemorrhagic infarct of the left parietal lobe does not appear acute, progression of chronic microvascular ischemic changes.   Clinical Impression  Pt adm due to the above. Presents with limitations in functional mobility secondary to deficits indicated below. Pt presents with Lt hemiparesis and demo some ataxic like movement patterns in Lt UE during session today. Pt to benefit from skilled acute PT to address deficits and facilitate transition to next venue. Pt is very motivated and supported well by his family, who is at bedside. Wife will be will pt 24/7 as needed upon D/C. Pt to be a great candidate for CIR to address deficits indicated below and ensure safe D/C home with wife.   PT Assessment  Patient needs continued PT services    Follow Up Recommendations  CIR    Does the patient have the potential to tolerate intense rehabilitation      Barriers to Discharge        Equipment Recommendations  Other (comment) (TBD)    Recommendations for Other Services OT consult;Rehab consult   Frequency Min 4X/week    Precautions / Restrictions Precautions Precautions: Fall Precaution Comments: Lt sided weakness and ataxic like movements  Restrictions Weight Bearing Restrictions: No   Pertinent Vitals/Pain Supine 147/62;  sitting 135/119; sitting  115/67; c/o dizziness and returned to supine.       Mobility  Bed Mobility Overal bed mobility: Needs Assistance Bed Mobility: Sit to Supine;Supine to Sit Supine to sit: Mod assist;HOB elevated Sit to supine: Mod assist General bed mobility comments: pt was able to minimally advance Lt LE off bed with max multimodal cues and incr time to initiate movement; pt required mod (A) to bring shoulders to sitting position; pt attempting to push up through Rt UE; pt returned to supine position due to dizziness. Pt tolerated sitting EOB 5 min  Transfers General transfer comment: unable to assess due to dizziness  Ambulation/Gait General Gait Details: not assessed; recommend 2 person for safety  Modified Rankin (Stroke Patients Only) Pre-Morbid Rankin Score: No symptoms Modified Rankin: Moderately severe disability         PT Diagnosis: Difficulty walking;Generalized weakness  PT Problem List: Decreased strength;Decreased range of motion;Decreased activity tolerance;Decreased balance;Decreased mobility;Decreased cognition;Decreased knowledge of use of DME;Decreased safety awareness PT Treatment Interventions: DME instruction;Gait training;Therapeutic activities;Therapeutic exercise;Balance training;Neuromuscular re-education;Patient/family education     PT Goals(Current goals can be found in the care plan section) Acute Rehab PT Goals Patient Stated Goal: to walk the block at home  PT Goal Formulation: With patient/family Time For Goal Achievement: 08/17/13 Potential to Achieve Goals: Good  Visit Information  Last PT Received On: 08/03/13 Assistance Needed: +2 (for ambulation/safety ) History of Present Illness: 80 y.o. male with a history of afib, previous stroke not on anticoagulation who presents with sudden onset left sided weakness and numbness that started at 9:45  this morning. He was in his normal state prior to this. HE was drving when he noticed the left  sided weakness.  PMH:  HTN, CVA, history of patent foramen ovale, cellulitis of face.  CT 1/22 Hemorrhagic infarcts involving the posterior right basal ganglia as described, Interval nonhemorrhagic infarct of the left parietal lobe does not appear acute, progression of chronic microvascular ischemic changes.        Prior [place] expects to be discharged to:: Private residence Living Arrangements: Spouse/significant other Available Help at Discharge: Family;Available 24 hours/day Type of Home: House Home Access: Stairs to enter [employer] of Steps: 6 Home Layout: Two level;Bed/bath upstairs;Other (Comment) (wife reports they can make arrangements for first floor bed) Alternate Level Stairs-Number of Steps: flight Alternate Level Stairs-Rails: Can reach both Home Equipment: None Additional Comments: wife present to confirm PLOF and home setup; pt exercised weely prior to admission Prior Function Level of Independence: Independent Comments: pt driving and completely independent with ADLs Communication Communication: HOH;Other (comment) (pt wears glasses all the time ) Dominant Hand: Left    Cognition  Cognition Arousal/Alertness: Awake/alert Behavior During Therapy: Impulsive Overall Cognitive Status: Impaired/Different from baseline Area of Impairment: Following commands;Safety/judgement;Problem solving Following Commands: Follows one step commands consistently;Follows one step commands with increased time Safety/Judgement: Decreased awareness of deficits;Decreased awareness of safety Problem Solving: Difficulty sequencing;Slow processing;Requires verbal cues;Requires tactile cues    Extremity/Trunk Assessment Upper Extremity Assessment Upper Extremity Assessment: LUE deficits/detail LUE Deficits / Details: OT eval pending; pt demo decreased strength with Lt UE and demo ataxic like movements  LUE Coordination: decreased gross motor Lower  Extremity Assessment Lower Extremity Assessment: LLE deficits/detail LLE Deficits / Details: Lt knee grossly 3/5; hip 3-/5  Cervical / Trunk Assessment Cervical / Trunk Assessment: Normal   Balance Balance Overall balance assessment: Needs assistance Sitting-balance support: Feet supported;Bilateral upper extremity supported Sitting balance-Leahy Scale: Fair Sitting balance - Comments: positioned Lt UE to increase WBing and promote independence for sitting position at EOB; pt demo good postural control with multimodal cues to maintain midline position; pt with ataxic like movements in Lt UE and required approximation to inhbit movements. Utilizing facilitation to increase control of ant/post weight shifts. Postural control: Posterior lean General Comments General comments (skin integrity, edema, etc.): slightly edematous in Lt UE  End of Session PT - End of Session Activity Tolerance: Patient tolerated treatment well Patient left: in bed;with call bell/phone within reach Nurse Communication: Mobility status  GP     Gustavus Bryant, [place] 08/03/2013, 2:44 PM

## 2013-08-04 ENCOUNTER — Inpatient Hospital Stay (HOSPITAL_COMMUNITY): Payer: Medicare Other

## 2013-08-04 LAB — CBC
HCT: 41.2 % (ref 39.0–52.0)
HEMOGLOBIN: 13.9 g/dL (ref 13.0–17.0)
MCH: 29 pg (ref 26.0–34.0)
MCHC: 33.7 g/dL (ref 30.0–36.0)
MCV: 86 fL (ref 78.0–100.0)
Platelets: 372 10*3/uL (ref 150–400)
RBC: 4.79 MIL/uL (ref 4.22–5.81)
RDW: 13.7 % (ref 11.5–15.5)
WBC: 10.1 10*3/uL (ref 4.0–10.5)

## 2013-08-04 LAB — BASIC METABOLIC PANEL
BUN: 11 mg/dL (ref 6–23)
CO2: 25 mEq/L (ref 19–32)
Calcium: 8.7 mg/dL (ref 8.4–10.5)
Chloride: 103 mEq/L (ref 96–112)
Creatinine, Ser: 0.76 mg/dL (ref 0.50–1.35)
GFR calc Af Amer: >90 mL/min (ref >90)
GFR, EST NON AFRICAN AMERICAN: 86 mL/min — AB (ref >90)
GLUCOSE: 95 mg/dL (ref 70–99)
POTASSIUM: 4.1 meq/L (ref 3.7–5.3)
SODIUM: 141 meq/L (ref 137–147)

## 2013-08-04 LAB — PROTIME-INR
INR: 1.21 (ref 0.00–1.49)
PROTHROMBIN TIME: 15 s (ref 11.6–15.2)

## 2013-08-04 NOTE — Progress Notes (Signed)
Physical Therapy Treatment Patient Details Name: GIANFRANCO ARAKI MRN: [id number] DOB: [birth year] Today's Date: 08/04/2013 Time: 0737-1062 PT Time Calculation (min): 27 min  PT Assessment / Plan / Recommendation  History of Present Illness 80 y.o. male with a history of afib, previous stroke not on anticoagulation who presents with sudden onset left sided weakness and numbness that started at 9:45 this morning. He was in his normal state prior to this. HE was drving when he noticed the left sided weakness.  PMH:  HTN, CVA, history of patent foramen ovale, cellulitis of face.  CT 1/22 Hemorrhagic infarcts involving the posterior right basal ganglia as described, Interval nonhemorrhagic infarct of the left parietal lobe does not appear acute, progression of chronic microvascular ischemic changes.    PT Comments   Pt frustrated with lack of return of function to left UE and LE, motion continues to be ataxic, pt requiring mod A to maintain sitting EOB and +2 total assist, pt 40%, with lack of ankle and knee control, to transfer OOB to chair. Continue to recommend CIR as soon as possible. PT will continue to follow.   Follow Up Recommendations  CIR     Does the patient have the potential to tolerate intense rehabilitation     Barriers to Discharge        Equipment Recommendations  Other (comment) (TBD)    Recommendations for Other Services OT consult;Rehab consult  Frequency Min 4X/week   Progress towards PT Goals Progress towards PT goals: Progressing toward goals  Plan Current plan remains appropriate    Precautions / Restrictions Precautions Precautions: Fall Precaution Comments: Lt sided weakness and ataxic like movements  Restrictions Weight Bearing Restrictions: No   Pertinent Vitals/Pain VSS    Mobility  Bed Mobility Overal bed mobility: Needs Assistance Bed Mobility: Sit to Supine Supine to sit: Mod assist;HOB elevated General bed mobility comments: pt was able to roll to  left side with vc's, min A to get legs all the way off bed to intiate sitting up. Pt attempted SL to sit 2x without success, mod A given to complete task. Task improved when pt cued to reach towards foot of bed with RUE as he came to sitting Transfers Overall transfer level: Needs assistance Equipment used: 2 person hand held assist;Rolling walker (2 wheeled) Transfers: Sit to/from [facility] Sit to Stand: +2 physical assistance Stand pivot transfers: +2 physical assistance General transfer comment: pt performed sit to stand multiple times, required stabilization of left knee as well as assistance to keep left foot flat on floor as it is rolling into inverted position. Practiced standing up to RW but pt unable to grasp left handrest and keep hand there. Left knee buckling with wt-bearing. For SPT, pt required manual facilitation for wt-shifting as well as sliding left foot. Once LLE advance, left knee and foot had to be stabilized before wt-shifting to left for right step. RW not used for SPT.   Ambulation/Gait General Gait Details: NT Modified Rankin (Stroke Patients Only) Pre-Morbid Rankin Score: No symptoms Modified Rankin: Severe disability    Exercises General Exercises - Lower Extremity Ankle Circles/Pumps: AROM;Both;10 reps;Seated Quad Sets: AROM;Left;10 reps;Seated Long Arc Quad: AROM;Left;10 reps;Seated Hip ABduction/ADduction: AROM;Left;10 reps;Supine Straight Leg Raises: AROM;Left;10 reps;Supine;Limitations Straight Leg Raises Limitations: ataxic motion   PT Diagnosis:    PT Problem List:   PT Treatment Interventions:     PT Goals (current goals can now be found in the care plan section) Acute Rehab PT Goals Patient Stated  Goal: to walk the block at home  PT Goal Formulation: With patient/family Time For Goal Achievement: 08/17/13 Potential to Achieve Goals: Good  Visit Information  Last PT Received On: 08/04/13 Assistance Needed: +2 (for OOB) History  of Present Illness: 80 y.o. male with a history of afib, previous stroke not on anticoagulation who presents with sudden onset left sided weakness and numbness that started at 9:45 this morning. He was in his normal state prior to this. HE was drving when he noticed the left sided weakness.  PMH:  HTN, CVA, history of patent foramen ovale, cellulitis of face.  CT 1/22 Hemorrhagic infarcts involving the posterior right basal ganglia as described, Interval nonhemorrhagic infarct of the left parietal lobe does not appear acute, progression of chronic microvascular ischemic changes.     Subjective Data  Subjective: to get stronger on left side Patient Stated Goal: to walk the block at home    Cognition  Cognition Arousal/Alertness: Awake/alert Behavior During Therapy: Impulsive Overall Cognitive Status: Impaired/Different from baseline Area of Impairment: Following commands;Safety/judgement;Problem solving Following Commands: Follows one step commands consistently;Follows one step commands with increased time Safety/Judgement: Decreased awareness of deficits;Decreased awareness of safety Problem Solving: Difficulty sequencing;Slow processing;Requires verbal cues;Requires tactile cues General Comments: pt very impulsive with functional tasks    Balance  Balance Overall balance assessment: Needs assistance Sitting-balance support: Feet supported;Single extremity supported Sitting balance-Leahy Scale: Poor Sitting balance - Comments: performed wt-bearing activity through left hand and elbow with mod A. Pt with ataxic mvmts in LUE and LLE. Pt continues to need cues to maintain midlibne position in sitting EOB as well as chair but pt able to perform minor self-correction with vc's.  Postural control: Left lateral lean Standing balance support: Bilateral upper extremity supported;During functional activity Standing balance-Leahy Scale: Zero Standing balance comment: pt performing 40% with transfers  and static standing, requiring +2 assist and stabilization at left knee and foot  End of Session PT - End of Session Equipment Utilized During Treatment: Gait belt Activity Tolerance: Patient tolerated treatment well Patient left: in chair;with call bell/phone within reach Nurse Communication: Mobility status   GP    Leighton Roach, [facility]  [phone]  Leighton Roach 08/04/2013, 11:27 AM

## 2013-08-04 NOTE — Evaluation (Signed)
Occupational Therapy Evaluation Patient Details Name: TAE VONADA MRN: [id number] DOB: 1934-05-26 Today's Date: 08/04/2013 Time: 7619-5093 OT Time Calculation (min): 55 min  OT Assessment / Plan / Recommendation History of present illness 80 y.o. male with a history of afib, previous stroke not on anticoagulation who presents with sudden onset left sided weakness and numbness that started at 9:45 this morning. He was in his normal state prior to this. HE was drving when he noticed the left sided weakness.  PMH:  HTN, CVA, history of patent foramen ovale, cellulitis of face.  CT 1/22 Hemorrhagic infarcts involving the posterior right basal ganglia as described, Interval nonhemorrhagic infarct of the left parietal lobe does not appear acute, progression of chronic microvascular ischemic changes.    Clinical Impression   Pt admitted with above.  He demonstrates the below listed deficits and will benefit from continued OT to maximize safety and independence with BADLs.  Currently, he requires mod - total A with BADLs.  Pt demonstrated significant improvements with postural control EOB during OT eval.  Feel he would be an excellent candidate for CIR.      OT Assessment  Patient needs continued OT Services    Follow Up Recommendations  CIR;Supervision/Assistance - 24 hour    Barriers to Discharge      Equipment Recommendations  None recommended by OT    Recommendations for Other Services Rehab consult  Frequency  Min 3X/week    Precautions / Restrictions Precautions Precautions: Fall Precaution Comments: Lt sided weakness and ataxic like movements    Pertinent Vitals/Pain     ADL  Eating/Feeding: Minimal assistance Where Assessed - Eating/Feeding: Bed level Grooming: Brushing hair;Maximal assistance;Minimal assistance Where Assessed - Grooming: Unsupported sitting Upper Body Bathing: Moderate assistance Where Assessed - Upper Body Bathing: Supported sitting Lower Body Bathing:  Maximal assistance Where Assessed - Lower Body Bathing: Supported sit to stand Upper Body Dressing: Maximal assistance Where Assessed - Upper Body Dressing: Supported sitting Lower Body Dressing: +1 Total assistance Where Assessed - Lower Body Dressing: Supported sit to Lobbyist: Maximal assistance Toilet Transfer Method: Sit to Loss adjuster, chartered: Bedside commode Toileting - Clothing Manipulation and Hygiene: +1 Total assistance Where Assessed - Toileting Clothing Manipulation and Hygiene: Standing ADL Comments: Pt initially leaning heavily to lt. while sitting EOB.  He initially required max A for balance when attempting to comb hair with Lt. UE; however, after facilitation provided at trunk and activities to improve balance, pt was able to comb hair sitting EOB with min A for balance    OT Diagnosis: Generalized weakness;Cognitive deficits  OT Problem List: Decreased strength;Impaired balance (sitting and/or standing);Decreased coordination;Decreased cognition;Decreased safety awareness;Decreased knowledge of use of DME or AE;Impaired sensation;Impaired UE functional use OT Treatment Interventions: Self-care/ADL training;Neuromuscular education;DME and/or AE instruction;Therapeutic activities;Cognitive remediation/compensation;Visual/perceptual remediation/compensation;Patient/family education;Balance training   OT Goals(Current goals can be found in the care plan section) Acute Rehab OT Goals Patient Stated Goal: To get better OT Goal Formulation: With patient Time For Goal Achievement: 08/18/13 Potential to Achieve Goals: Good ADL Goals Pt Will Perform Grooming: with min guard assist;sitting (unsupported EOB) Pt Will Perform Upper Body Bathing: with min assist;sitting (unsupported EOB) Pt Will Perform Lower Body Bathing: with mod assist;sit to/from stand Pt Will Perform Upper Body Dressing: with min assist;sitting (unsupported EOB) Pt Will Transfer to  Toilet: with min assist;stand pivot transfer;squat pivot transfer;bedside commode Pt Will Perform Toileting - Clothing Manipulation and hygiene: with min assist;sit to/from stand  Visit Information  Last  OT Received On: 08/04/13 Assistance Needed: +2 History of Present Illness: 80 y.o. male with a history of afib, previous stroke not on anticoagulation who presents with sudden onset left sided weakness and numbness that started at 9:45 this morning. He was in his normal state prior to this. HE was drving when he noticed the left sided weakness.  PMH:  HTN, CVA, history of patent foramen ovale, cellulitis of face.  CT 1/22 Hemorrhagic infarcts involving the posterior right basal ganglia as described, Interval nonhemorrhagic infarct of the left parietal lobe does not appear acute, progression of chronic microvascular ischemic changes.        Prior [place] expects to be discharged to:: Private residence Living Arrangements: Spouse/significant other Available Help at Discharge: Family;Available 24 hours/day Type of Home: House Home Access: Stairs to enter [employer] of Steps: 6 Home Layout: Two level;Bed/bath upstairs;Other (Comment) (wife reports they can make arrangements for first floor bed) Alternate Level Stairs-Number of Steps: flight Alternate Level Stairs-Rails: Can reach both Home Equipment: None Additional Comments: wife present to confirm PLOF and home setup; pt exercised weely prior to admission Prior Function Level of Independence: Independent Comments: pt driving and completely independent with ADLs.   Pt was working 32 hours/wk at [employer]: [place];Other (comment) (pt wears glasses all the time ) Dominant Hand: Left         Vision/Perception Vision - History Baseline Vision: Wears glasses all the time Patient Visual Report: No change from baseline Vision - Assessment Vision  Assessment: Vision not tested Additional Comments: Vision to be tested next visit Perception Perception: Impaired Body Scheme: Pt with poor awareness of his position in space Praxis Praxis: Intact   Cognition  Cognition Arousal/Alertness: Awake/alert Behavior During Therapy: Impulsive Overall Cognitive Status: Impaired/Different from baseline Area of Impairment: Attention;Safety/judgement;Problem solving Current Attention Level: Selective Following Commands: Follows one step commands consistently Safety/Judgement: Decreased awareness of deficits Problem Solving: Slow processing;Difficulty sequencing;Requires verbal cues;Requires tactile cues General Comments: Pt with poor awareness perceptually of where he is in space and the impact of his deficits on movement/function    Extremity/Trunk Assessment Upper Extremity Assessment Upper Extremity Assessment: LUE deficits/detail LUE Deficits / Details: Pt with ataxia and impaired sensation. LUE Sensation: decreased proprioception;decreased light touch LUE Coordination: decreased fine motor;decreased gross motor Lower Extremity Assessment Lower Extremity Assessment: Defer to PT evaluation Cervical / Trunk Assessment Cervical / Trunk Assessment: Other exceptions Cervical / Trunk Exceptions: Pt with poor postural control.  He tends to push to lt. with Rt. LE and Rt. UE while seated EOB.  Pt with difficulty isolating and coordinating trunk movements - tends to flare Lt. rib cage laterally and pull with his Rt. UE and trunk when attempting to right balance.  Is able to tuck rib cage and activate abs with mod - min facilitation      Mobility Bed Mobility Overal bed mobility: Needs Assistance Bed Mobility: Supine to Sit;Sit to Sidelying Supine to sit: Mod assist Sit to sidelying: Min assist General bed mobility comments: Pt able to bridge and scoot Hips to EOB with mod facilitation Lt. LE and at hips, facilitation at scap/shoulder to flex  trunk to move into sitting.  When moving sit to sidelying, pt required cues for Lt. UE placement, and faciliction Lt. LE to move into flexion and pull legs onto bed Transfers Overall transfer level: Needs assistance Transfers: Sit to/from Stand Sit to Stand: Mod assist General transfer comment: Faciliation at  Lt. ankle, knee to control Lt. LE and facillitation at trunk for forward excursion and controlled movement sit to stand and stand to sit.  Pt scooted up Edge of bed by moving sit to partial stand.      Exercise     Balance Balance Overall balance assessment: Needs assistance Sitting-balance support: Single extremity supported;Bilateral upper extremity supported;Feet supported Sitting balance-Leahy Scale: Poor Sitting balance - Comments: Pt initially leaning heavily to Lt. Worked on trunk control and rotation to Lt. initially with bil. UEs as a  support.  pt progressed to min A and actively righting posture by pushing through Lt. UE and activating Lt. trunk.  At end of session,  pt progressed to reach with Rt. UE and closed chain movements of Lt. UE with min facillitation at Lt rib cage.  At end of session he was able to maintain EOB sitting with min guard assist x 3 mins.  Postural control: Left lateral lean;Posterior lean Standing balance support: Bilateral upper extremity supported Standing balance-Leahy Scale: Poor Standing balance comment: Pt requires faciliation Lt. ankle, knee, hip and Rt. rib cage    End of Session OT - End of Session Activity Tolerance: Patient tolerated treatment well Patient left: in bed;with call bell/phone within reach;with family/visitor present  Angels, Ellard Artis M 08/04/2013, 4:37 PM

## 2013-08-04 NOTE — Progress Notes (Signed)
Stroke Team Progress Note  HISTORY Jack [name] is a 80 y.o. male with a history of afib, previous stroke not on anticoagulation who presented 08/02/2013 with sudden onset left sided weakness and numbness that started at 9:45 that morning. He was in his normal state prior to this. HE was drving when he noticed the left sided weakness. He denied headache but had some nausea. CT showed right thalamic hemorrhage. His BP was 181/84 on arrival in the ED. He was enrolled in the ATACH-II trial for BP control. Patient was not administerd TPA secondary to hemorrhage. He was admitted to the neuro ICU for further evaluation and treatment.  SUBJECTIVE The patient's wife said to bedside today. The patient has not shown a significant change to this point. Discussed results of CT scan from today.  OBJECTIVE Most recent Vital Signs: Filed Vitals:   08/03/13 1630 08/03/13 2037 08/04/13 0007 08/04/13 0412  BP: 156/82 129/69 143/74 176/78  Pulse: 96 111 75 94  Temp: 98.6 F (37 C) 98.2 F (36.8 C) 98.4 F (36.9 C) 98.1 F (36.7 C)  TempSrc: Oral Oral Oral Oral  Resp: 18 18 18 16   Height:      Weight:      SpO2: 96% 95% 95% 98%   CBG (last 3)   Recent Labs  08/02/13 1034  GLUCAP 105*    IV Fluid Intake:   . 0.9 % NaCl with KCl 20 mEq / L 50 mL/hr at 08/03/13 1500    MEDICATIONS  . amLODipine  5 mg Oral Daily  . finasteride  5 mg Oral Daily  . irbesartan  150 mg Oral Daily   And  . hydrochlorothiazide  12.5 mg Oral Daily  . pantoprazole  40 mg Oral Daily  . pneumococcal 23 valent vaccine  0.5 mL Intramuscular Tomorrow-1000  . senna-docusate  1 tablet Oral BID   PRN:  acetaminophen, acetaminophen, labetalol, RESOURCE THICKENUP CLEAR  Diet:  Dysphagia  Activity:  Bedrest DVT Prophylaxis:  SCDs   CLINICALLY SIGNIFICANT STUDIES Basic Metabolic Panel:   Recent Labs Lab 08/03/13 0220 08/04/13 0501  NA 139 141  K 3.6* 4.1  CL 100 103  CO2 26 25  GLUCOSE 91 95  BUN 13 11   CREATININE 0.83 0.76  CALCIUM 8.6 8.7   Liver Function Tests:   Recent Labs Lab 08/02/13 1021  AST 20  ALT 13  ALKPHOS 42  BILITOT 0.4  PROT 7.6  ALBUMIN 4.0   CBC:  Recent Labs Lab 08/02/13 1021  08/03/13 0220 08/04/13 0501  WBC 11.3*  --  8.9 10.1  NEUTROABS 5.8  --   --   --   HGB 15.0  < > 12.5* 13.9  HCT 43.2  < > 36.6* 41.2  MCV 87.4  --  87.1 86.0  PLT 355  --  338 372  < > = values in this interval not displayed. Coagulation:   Recent Labs Lab 08/02/13 1021 08/03/13 0220 08/04/13 0501  LABPROT 13.0 15.2 15.0  INR 1.00 1.23 1.21   Cardiac Enzymes:   Recent Labs Lab 08/02/13 1021  TROPONINI <0.30   Urinalysis:   Recent Labs Lab 08/02/13 1206  COLORURINE YELLOW  LABSPEC 1.014  PHURINE 7.5  GLUCOSEU NEGATIVE  HGBUR NEGATIVE  BILIRUBINUR NEGATIVE  KETONESUR NEGATIVE  PROTEINUR NEGATIVE  UROBILINOGEN 0.2  NITRITE NEGATIVE  LEUKOCYTESUR NEGATIVE   Lipid Panel    Component Value Date/Time   CHOL  Value: 128  ATP III CLASSIFICATION:  <200     mg/dL   Desirable  200-239  mg/dL   Borderline High  >=240    mg/dL   High 12/12/2007 0430   TRIG 94 12/12/2007 0430   HDL 21* 12/12/2007 0430   CHOLHDL 6.1 12/12/2007 0430   VLDL 19 12/12/2007 0430   LDLCALC  Value: 88        Total Cholesterol/HDL:CHD Risk Coronary Heart Disease Risk Table                     Men   Women  1/2 Average Risk   3.4   3.3 12/12/2007 0430   HgbA1C  Lab Results  Component Value Date   HGBA1C  Value: 5.5 (NOTE)   The ADA recommends the following therapeutic goals for glycemic   control related to Hgb A1C measurement:   Goal of Therapy:   < 7.0% Hgb A1C   Action Suggested:  > 8.0% Hgb A1C   Ref:  Diabetes Care, 22, Suppl. 1, 1999 12/12/2007    Urine Drug Screen:     Component Value Date/Time   LABOPIA NONE DETECTED 08/02/2013 1206   COCAINSCRNUR NONE DETECTED 08/02/2013 1206   LABBENZ NONE DETECTED 08/02/2013 1206   AMPHETMU NONE DETECTED 08/02/2013 1206   THCU NONE DETECTED  08/02/2013 1206   LABBARB NONE DETECTED 08/02/2013 1206    Alcohol Level:   Recent Labs Lab 08/02/13 1021  ETH <11    CT of the brain   08/02/2013     1. Hemorrhagic infarcts involving the posterior right basal ganglia as described. 2. Interval nonhemorrhagic infarct of the left parietal lobe does not appear acute. 3. Progression of chronic microvascular ischemic changes.   CT of Brain 08/04/2013 Similar appearance of intracranial hemorrhage involving the  posterior aspect of the right lenticular nucleus and right thalamus  with surrounding mild vasogenic edema. Mild local mass effect upon  the right lateral ventricle. No midline shift.  No new intracranial hemorrhage noted.   MRI of the brain  not indicated  MRA of the brain  not indicated  2D Echocardiogram  not indicated  Carotid Doppler  not indicated  CXR  pending  EKG  atrial fibrillation, rate 106. For complete results please see formal report.   Therapy Recommendations inpatient rehabilitation recommended. Rehabilitation M.D. consulted and agrees.  Physical Exam  General: The patient is alert and cooperative at the time of the examination.  Respiratory: Lung fields are clear.  Cardiovascular: Occasionally irregular rhythm, no murmurs  Abdomen: Positive bowel sounds, nontender  Skin: No significant peripheral edema is noted.   Neurologic Exam  Mental status: The patient is alert, fully cooperative.  Cranial nerves: Facial symmetry is present. Speech is dysarthric. Extraocular movements are full, visual fields are full. Decreased sensation on the left face as compared to the right to soft touch.  Motor: The patient has good strength on the right extremities. On the left, the patient has drift of the left arm to the bed. 3/5 strength on the left arm, 4/5 strength of the left leg  Sensory examination: Decreased soft touch sensation on the left leg as compared to the right, symmetric on the arms    Coordination: The patient has good finger-nose-finger and heel-to-shin on the right arm and leg. On the left leg, the patient is able to perform toe to finger with some dysmetria. The patient has difficulty performing finger-nose-finger with the left arm.  Gait and station: The gait cannot  be tested.  Reflexes: Deep tendon reflexes are symmetric.    ASSESSMENT [name] is a 80 y.o. male presenting with left sided weakness. Imaging confirms a right thalamic hemorrhage. Hemorrhage felt to be embolic secondary to accelerated hypertension with SBP 182 on arrival - BP was lowered using cardene. He was enrolled in the ATACH-II study for BP control.  On aspirin 81 mg orally every day prior to admission. No antithrombotics at this time secondary to bleed. Patient with resultant left hemiparesis, dysphagia. Work up underway.  atrial fibrillation, not on anticoagulants prior to admission, not an anticoagulant candidate now due to hemorrhage Hypertension Hyperlipidemia  (lipid panel from 2009) on no statin PTA - recheck lipid panel. Known PFO Hx stroke 2009   Hospital day # 2  TREATMENT/PLAN  Patient transferred out of the unit.  Therapist recommended inpatient rehabilitation. Rehabilitation M.D. Consulted and agrees.  ST for swallow due to coughing with liquids during the night - dysphagia 3 diet with nectar thick liquids ordered.  CT scan repeated today. Mild vasogenic edema. No change in hemorrhage.  Consider restarting antiplatelet therapy one month post bleed.  Mikey Bussing PA-C [facility] Pager [phone] 08/04/2013, 8:32 AM  I have personally obtained a history, examined the patient, evaluated imaging results, and formulated the assessment and plan of care. I agree with the above.

## 2013-08-05 MED ORDER — STARCH (THICKENING) PO POWD
ORAL | Status: DC | PRN
  Filled 2013-08-05: qty 227

## 2013-08-05 NOTE — Progress Notes (Signed)
Physical Therapy Treatment Patient Details Name: Jack [name] MRN: [id number] DOB: 08/02/1933 Today's Date: 08/05/2013 Time: 9937-1696 PT Time Calculation (min): 13 min  PT Assessment / Plan / Recommendation  History of Present Illness 80 y.o. male with a history of afib, previous stroke not on anticoagulation who presents with sudden onset left sided weakness and numbness that started at 9:45 this morning. He was in his normal state prior to this. HE was drving when he noticed the left sided weakness.  PMH:  HTN, CVA, history of patent foramen ovale, cellulitis of face.  CT 1/22 Hemorrhagic infarcts involving the posterior right basal ganglia as described, Interval nonhemorrhagic infarct of the left parietal lobe does not appear acute, progression of chronic microvascular ischemic changes.    PT Comments   Continue to recommend comprehensive inpatient rehab (CIR) for post-acute therapy needs.   Follow Up Recommendations  CIR     Does the patient have the potential to tolerate intense rehabilitation     Barriers to Discharge        Equipment Recommendations  Other (comment) (TBD)    Recommendations for Other Services OT consult;Rehab consult  Frequency Min 4X/week   Progress towards PT Goals    Plan Current plan remains appropriate    Precautions / Restrictions Precautions Precautions: Fall Precaution Comments: Lt sided weakness and ataxic like movements  Restrictions Weight Bearing Restrictions: No   Pertinent Vitals/Pain no apparent distress     Mobility  Bed Mobility Overal bed mobility: Needs Assistance Bed Mobility: Sit to Supine Supine to sit: Mod assist;HOB elevated General bed mobility comments: pt was able to roll to left side with vc's, min A to get legs all the way off bed to intiate sitting up. Assist provided for elevating trunk to fully upright sitting Transfers Overall transfer level: Needs assistance Equipment used: 2 person hand held assist;Rolling  walker (2 wheeled) Transfers: Sit to/from [facility] Sit to Stand: Mod assist;+2 safety/equipment Stand pivot transfers: +2 physical assistance;Max assist;+2 safety/equipment General transfer comment: pt performed sit to stand multiple times, required stabilization of left knee as well as assistance to keep left foot flat on floor as it is rolling into inverted position. Practiced standing up to RW but pt unable to grasp left handrest and keep hand there. Left knee buckling with wt-bearing. For SPT, pt required manual facilitation for wt-shifting as well as sliding left foot. Once LLE advance, left knee and foot had to be stabilized before wt-shifting to left for right step.  Ambulation/Gait General Gait Details: NT Modified Rankin (Stroke Patients Only) Pre-Morbid Rankin Score: No symptoms Modified Rankin: Severe disability    Exercises     PT Diagnosis:    PT Problem List:   PT Treatment Interventions:     PT Goals (current goals can now be found in the care plan section) Acute Rehab PT Goals Patient Stated Goal: to walk the block at home  PT Goal Formulation: With patient/family Time For Goal Achievement: 08/17/13 Potential to Achieve Goals: Good  Visit Information  Last PT Received On: 08/05/13 Assistance Needed: +2 (for OOB) History of Present Illness: 80 y.o. male with a history of afib, previous stroke not on anticoagulation who presents with sudden onset left sided weakness and numbness that started at 9:45 this morning. He was in his normal state prior to this. HE was drving when he noticed the left sided weakness.  PMH:  HTN, CVA, history of patent foramen ovale, cellulitis of face.  CT 1/22 Hemorrhagic  infarcts involving the posterior right basal ganglia as described, Interval nonhemorrhagic infarct of the left parietal lobe does not appear acute, progression of chronic microvascular ischemic changes.     Subjective Data  Subjective: to get stronger on left  side Patient Stated Goal: to walk the block at home    Cognition  Cognition Arousal/Alertness: Awake/alert Behavior During Therapy: Impulsive Overall Cognitive Status: Impaired/Different from baseline Area of Impairment: Following commands;Safety/judgement;Problem solving Following Commands: Follows one step commands consistently;Follows one step commands with increased time Safety/Judgement: Decreased awareness of deficits;Decreased awareness of safety Problem Solving: Difficulty sequencing;Slow processing;Requires verbal cues;Requires tactile cues General Comments: pt very impulsive with functional tasks    Balance  Balance Overall balance assessment: Needs assistance Sitting balance-Leahy Scale: Fair Sitting balance - Comments: performed wt-bearing activity through left hand and elbow with mod A. Pt with ataxic mvmts in LUE and LLE. Pt continues to need cues to maintain midlibne position in sitting EOB as well as chair but pt able to perform minor self-correction with vc's.  Standing balance support: Bilateral upper extremity supported;During functional activity Standing balance-Leahy Scale: Zero Standing balance comment: Continues to require facltitationa dn support L LE during transfer  End of Session PT - End of Session Equipment Utilized During Treatment: Gait belt Activity Tolerance: Patient tolerated treatment well Patient left: in chair;with call bell/phone within reach Nurse Communication: Mobility status   GP     Quin Hoop [place], [place]  08/05/2013, 2:09 PM

## 2013-08-05 NOTE — Progress Notes (Signed)
Stroke Team Progress Note  HISTORY Jack [name] is a 80 y.o. male with a history of afib, previous stroke not on anticoagulation who presented 08/02/2013 with sudden onset left sided weakness and numbness that started at 9:45 that morning. He was in his normal state prior to this. HE was drving when he noticed the left sided weakness. He denied headache but had some nausea. CT showed right thalamic hemorrhage. His BP was 181/84 on arrival in the ED. He was enrolled in the ATACH-II trial for BP control. Patient was not administerd TPA secondary to hemorrhage. He was admitted to the neuro ICU for further evaluation and treatment.  SUBJECTIVE The patient's wife said to bedside today. The patient has not shown a significant change to this point. Discussed results of CT scan from today.  OBJECTIVE Most recent Vital Signs: Filed Vitals:   08/04/13 2000 08/05/13 0000 08/05/13 0400 08/05/13 0800  BP: 132/74 121/90 140/58 154/71  Pulse: 80 71 74 69  Temp: 99.3 F (37.4 C) 98.9 F (37.2 C) 97.9 F (36.6 C) 97 F (36.1 C)  TempSrc:  Oral Oral Oral  Resp: 15 15 16 16   Height:      Weight:      SpO2: 95% 96% 97% 95%   CBG (last 3)  No results found for this basename: GLUCAP,  in the last 72 hours  IV Fluid Intake:   . 0.9 % NaCl with KCl 20 mEq / L 50 mL/hr at 08/05/13 0129    MEDICATIONS  . amLODipine  5 mg Oral Daily  . finasteride  5 mg Oral Daily  . irbesartan  150 mg Oral Daily   And  . hydrochlorothiazide  12.5 mg Oral Daily  . pantoprazole  40 mg Oral Daily  . senna-docusate  1 tablet Oral BID   PRN:  acetaminophen, acetaminophen, labetalol, RESOURCE THICKENUP CLEAR  Diet:  Dysphagia  Activity:  Bedrest DVT Prophylaxis:  SCDs   CLINICALLY SIGNIFICANT STUDIES Basic Metabolic Panel:   Recent Labs Lab 08/03/13 0220 08/04/13 0501  NA 139 141  K 3.6* 4.1  CL 100 103  CO2 26 25  GLUCOSE 91 95  BUN 13 11  CREATININE 0.83 0.76  CALCIUM 8.6 8.7   Liver Function Tests:    Recent Labs Lab 08/02/13 1021  AST 20  ALT 13  ALKPHOS 42  BILITOT 0.4  PROT 7.6  ALBUMIN 4.0   CBC:  Recent Labs Lab 08/02/13 1021  08/03/13 0220 08/04/13 0501  WBC 11.3*  --  8.9 10.1  NEUTROABS 5.8  --   --   --   HGB 15.0  < > 12.5* 13.9  HCT 43.2  < > 36.6* 41.2  MCV 87.4  --  87.1 86.0  PLT 355  --  338 372  < > = values in this interval not displayed. Coagulation:   Recent Labs Lab 08/02/13 1021 08/03/13 0220 08/04/13 0501  LABPROT 13.0 15.2 15.0  INR 1.00 1.23 1.21   Cardiac Enzymes:   Recent Labs Lab 08/02/13 1021  TROPONINI <0.30   Urinalysis:   Recent Labs Lab 08/02/13 1206  COLORURINE YELLOW  LABSPEC 1.014  PHURINE 7.5  GLUCOSEU NEGATIVE  HGBUR NEGATIVE  BILIRUBINUR NEGATIVE  KETONESUR NEGATIVE  PROTEINUR NEGATIVE  UROBILINOGEN 0.2  NITRITE NEGATIVE  LEUKOCYTESUR NEGATIVE   Lipid Panel    Component Value Date/Time   CHOL  Value: 128        ATP III CLASSIFICATION:  <200  mg/dL   Desirable  200-239  mg/dL   Borderline High  >=240    mg/dL   High 12/12/2007 0430   TRIG 94 12/12/2007 0430   HDL 21* 12/12/2007 0430   CHOLHDL 6.1 12/12/2007 0430   VLDL 19 12/12/2007 0430   LDLCALC  Value: 88        Total Cholesterol/HDL:CHD Risk Coronary Heart Disease Risk Table                     Men   Women  1/2 Average Risk   3.4   3.3 12/12/2007 0430   HgbA1C  Lab Results  Component Value Date   HGBA1C  Value: 5.5 (NOTE)   The ADA recommends the following therapeutic goals for glycemic   control related to Hgb A1C measurement:   Goal of Therapy:   < 7.0% Hgb A1C   Action Suggested:  > 8.0% Hgb A1C   Ref:  Diabetes Care, 22, Suppl. 1, 1999 12/12/2007    Urine Drug Screen:     Component Value Date/Time   LABOPIA NONE DETECTED 08/02/2013 1206   COCAINSCRNUR NONE DETECTED 08/02/2013 1206   LABBENZ NONE DETECTED 08/02/2013 1206   AMPHETMU NONE DETECTED 08/02/2013 1206   THCU NONE DETECTED 08/02/2013 1206   LABBARB NONE DETECTED 08/02/2013 1206    Alcohol  Level:   Recent Labs Lab 08/02/13 1021  ETH <11    CT of the brain   08/02/2013     1. Hemorrhagic infarcts involving the posterior right basal ganglia as described. 2. Interval nonhemorrhagic infarct of the left parietal lobe does not appear acute. 3. Progression of chronic microvascular ischemic changes.   CT of Brain 08/04/2013 Similar appearance of intracranial hemorrhage involving the  posterior aspect of the right lenticular nucleus and right thalamus  with surrounding mild vasogenic edema. Mild local mass effect upon  the right lateral ventricle. No midline shift.  No new intracranial hemorrhage noted.   MRI of the brain  not indicated  MRA of the brain  not indicated  2D Echocardiogram  not indicated  Carotid Doppler  not indicated  CXR  pending  EKG  atrial fibrillation, rate 106. For complete results please see formal report.   Therapy Recommendations inpatient rehabilitation recommended. Rehabilitation M.D. consulted and agrees.  Physical Exam  General: The patient is alert and cooperative at the time of the examination.  Respiratory: Lung fields are clear.  Cardiovascular: Occasionally irregular rhythm, no murmurs  Abdomen: Positive bowel sounds, nontender  Skin: No significant peripheral edema is noted.   Neurologic Exam  Mental status: The patient is alert, fully cooperative.  Cranial nerves: Facial symmetry is present. Speech is dysarthric. Extraocular movements are full, visual fields are full. Decreased sensation on the left face as compared to the right to soft touch.  Motor: The patient has good strength on the right extremities. On the left, the patient has drift of the left arm to the bed. 4-/5 strength on the left arm, 4/5 strength of the left leg  Sensory examination: Decreased soft touch sensation on the left leg as compared to the right, symmetric on the arms   Coordination: The patient has good finger-nose-finger and heel-to-shin on  the right arm and leg. On the left leg, the patient is able to perform toe to finger with some dysmetria. The patient has difficulty performing finger-nose-finger with the left arm.  Gait and station: The gait cannot be tested.  Reflexes: Deep tendon reflexes are symmetric.  ASSESSMENT [name] is a 80 y.o. male presenting with left sided weakness. Imaging confirms a right thalamic hemorrhage. Hemorrhage felt to be embolic secondary to accelerated hypertension with SBP 182 on arrival - BP was lowered using cardene. He was enrolled in the ATACH-II study for BP control.  On aspirin 81 mg orally every day prior to admission. No antithrombotics at this time secondary to bleed. Patient with resultant left hemiparesis, dysphagia. Work up underway.  atrial fibrillation, not on anticoagulants prior to admission, not an anticoagulant candidate now due to hemorrhage Hypertension Hyperlipidemia  (lipid panel from 2009) on no statin PTA - recheck lipid panel. Known PFO Hx stroke 2009   Hospital day # 3  TREATMENT/PLAN  Patient transferred out of the unit.  Therapist recommended inpatient rehabilitation. Rehabilitation M.D. Consulted and agrees.  ST for swallow due to coughing with liquids during the night - dysphagia 3 diet with nectar thick liquids ordered.  CT scan repeated today. Mild vasogenic edema. No change in hemorrhage.  Consider restarting antiplatelet therapy one month post bleed.  BP control longterm goal less than 130/85.   Mikey Bussing PA-C [facility] Pager [phone] 08/05/2013, 10:54 AM  I have personally obtained a history, examined the patient, evaluated imaging results, and formulated the assessment and plan of care. I agree with the above.

## 2013-08-06 ENCOUNTER — Inpatient Hospital Stay (HOSPITAL_COMMUNITY): Payer: Medicare Other

## 2013-08-06 MED ORDER — DIPHENHYDRAMINE-ZINC ACETATE 2-0.1 % EX CREA
TOPICAL_CREAM | Freq: Two times a day (BID) | CUTANEOUS | Status: DC | PRN
  Administered 2013-08-06: 17:00:00 via TOPICAL
  Filled 2013-08-06: qty 28

## 2013-08-06 NOTE — H&P (Signed)
Physical Medicine and Rehabilitation Admission H&P    Chief Complaint  Patient presents with  . Left sided weakness. And elevated BP   HPI:  Jack [name] is a 80 y.o. left-handed male with history of atrial fibrillation, hypertension, PFO, CVA 2009 not on anticoagulations and very little residual weakness. Admitted 08/02/2013 with left-sided weakness. Cranial CT scan showed 17 x 21 hemorrhagic infarcts posterior right basal ganglia and progression of microvascular disease. His his systolic blood pressure 789/38 on arrival.  Follow cranial CT scan 08/04/2013 with stable hypertensive hemorrhage that's stable and mild vasogenic edema. Patient enrolled in ATACH-II study for BP control.  Neurology recommends repeat CCT in 2-3 weeks to decide on antiplatelet therapy. MBS done yesterday and patient advanced to D3, thin liquids due to oropharyngeal dysphagia.  Patient with resultant left hemiparesis and dysphagia. Rehab team recommended CIR and patient admitted today.   Review of Systems  HENT: Positive for hearing loss.   Eyes: Negative for blurred vision and double vision.  Respiratory: Negative for cough and shortness of breath.   Cardiovascular: Negative for chest pain and palpitations.  Gastrointestinal: Negative for heartburn, nausea and constipation.  Genitourinary: Negative for dysuria and frequency.  Musculoskeletal: Negative for back pain, myalgias and neck pain.  Neurological: Positive for sensory change, speech change and focal weakness. Negative for headaches.  Psychiatric/Behavioral: The patient has insomnia.     Past Medical History  Diagnosis Date  . Hypertension   . ED (erectile dysfunction)   . CVA (cerebral infarction) 2009  . Atrial fibrillation     followed by Dr. Wynonia Lawman  . Hyperlipidemia   . Patent foramen ovale     history of  . Microscopic hematuria   . Elevated PSA     [facility], Dr. Serita Butcher;   . Cellulitis of face 11/08    [facility]   Past  Surgical History  Procedure Laterality Date  . Mitral valve repair     No family history on file.  Social History:  Married. 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 He reports that he has never smoked. He has never used smokeless tobacco. He reports that he does not drink alcohol or use illicit drugs.  Allergies: No Known Allergies  Medications Prior to Admission  Medication Sig Dispense Refill  . amLODipine (NORVASC) 5 MG tablet Take 5 mg by mouth daily.        . finasteride (PROSCAR) 5 MG tablet Take 5 mg by mouth daily.      . sildenafil (VIAGRA) 50 MG tablet Take 50 mg by mouth daily as needed.        . valsartan-hydrochlorothiazide (DIOVAN-HCT) 160-12.5 MG per tablet Take 1 tablet by mouth daily.        . [DISCONTINUED] aspirin 81 MG tablet Take 81 mg by mouth daily.          Home: Home Living Family/patient expects to be discharged to:: Private residence Living Arrangements: Spouse/significant other Available Help at Discharge: Family;Available 24 hours/day Type of Home: House Home Access: Stairs to enter [employer] of Steps: 6 Home Layout: Two level;Bed/bath upstairs;Other (Comment) (wife reports they can make arrangements for first floor bed) Alternate Level Stairs-Number of Steps: flight Alternate Level Stairs-Rails: Can reach both Home Equipment: None Additional Comments: wife present to confirm PLOF and home setup; pt exercised weely prior to admission  Lives With: Spouse   Functional History: Prior Function Comments: pt driving and completely independent with ADLs.   Pt was  working 32 hours/wk at [employer]:  Mobility:     Ambulation/Gait General Gait Details: NT    ADL: ADL Eating/Feeding: Minimal assistance Where Assessed - Eating/Feeding: Bed level Grooming: Brushing hair;Maximal assistance;Minimal assistance Where Assessed - Grooming: Unsupported sitting Upper Body Bathing: Moderate  assistance Where Assessed - Upper Body Bathing: Supported sitting Lower Body Bathing: Maximal assistance Where Assessed - Lower Body Bathing: Supported sit to stand Upper Body Dressing: Maximal assistance Where Assessed - Upper Body Dressing: Supported sitting Lower Body Dressing: +1 Total assistance Where Assessed - Lower Body Dressing: Supported sit to Lobbyist: Maximal assistance Toilet Transfer Method: Sit to Loss adjuster, chartered: Bedside commode ADL Comments: Pt initially leaning heavily to lt. while sitting EOB.  He initially required max A for balance when attempting to comb hair with Lt. UE; however, after facilitation provided at trunk and activities to improve balance, pt was able to comb hair sitting EOB with min A for balance  Cognition: Cognition Overall Cognitive Status: Impaired/Different from baseline Orientation Level: Oriented X4 Cognition Arousal/Alertness: Awake/alert Behavior During Therapy: Impulsive Overall Cognitive Status: Impaired/Different from baseline Area of Impairment: Following commands;Safety/judgement;Problem solving Current Attention Level: Selective Following Commands: Follows one step commands consistently;Follows one step commands with increased time Safety/Judgement: Decreased awareness of deficits;Decreased awareness of safety Problem Solving: Difficulty sequencing;Slow processing;Requires verbal cues;Requires tactile cues General Comments: pt very impulsive with functional tasks  Physical Exam: Blood pressure 123/62, pulse 62, temperature 97.4 F (36.3 C), temperature source Oral, resp. rate 18, height 5\' 3"  (1.6 m), weight 65.772 kg (145 lb), SpO2 97.00%. Physical Exam  Vitals reviewed.  Constitutional: He is oriented to person, place, and time. No distress HEENT: oral mucosa moist. Missing a few teeth Eyes:  Pupils reactive to light Neck: Normal range of motion. Neck supple. No thyromegaly present.   Cardiovascular:  Rate controlled, irregular rhythm, small sem Respiratory: Effort normal and breath sounds normal. No respiratory distress. No wheezes or rales GI: Soft. Bowel sounds are normal. He exhibits no distension.  Neurological: He is alert and oriented to person, place, and time.  Flat affect. Follows commands. Speech is mildly dysarthric but intelligible. Improving oromotor control. A little slow, but better response time toda. LUE is grossly 2 to 2+ but inconsistent, may have some apraxia.  LLE is better at 3/5 HF, KE and 4- ADF/APF.  Senses pain in all 4 but more responsive on the right.. Limited insight and awareness.  Skin: Skin is warm and dry. Right antecubital IV site appears inflamed.   No results found for this or any previous visit (from the past 48 hour(s)).   Post Admission Physician Evaluation: 1. Functional deficits secondary  to right basal ganglia infarcts. 2. Patient is admitted to receive collaborative, interdisciplinary care between the physiatrist, rehab nursing staff, and therapy team. 3. Patient's level of medical complexity and substantial therapy needs in context of that medical necessity cannot be provided at a lesser intensity of care such as a SNF. 4. Patient has experienced substantial functional loss from his/her baseline which was documented above under the "Functional History" and "Functional Status" headings.  Judging by the patient's diagnosis, physical exam, and functional history, the patient has potential for functional progress which will result in measurable gains while on inpatient rehab.  These gains will be of substantial and practical use upon discharge  in facilitating mobility and self-care at the household level. 5. Physiatrist will provide 24 hour management of medical needs as well  as oversight of the therapy plan/treatment and provide guidance as appropriate regarding the interaction of the two. 6. 24 hour rehab nursing will assist with  bladder management, bowel management, safety, skin/wound care, disease management, medication administration, pain management and patient education  and help integrate therapy concepts, techniques,education, etc. 7. PT will assess and treat for/with: Lower extremity strength, range of motion, stamina, balance, functional mobility, safety, adaptive techniques and equipment, NMR, education, visual and cognitive perceptual rx.   Goals are: supervision to min assist. 8. OT will assess and treat for/with: ADL's, functional mobility, safety, upper extremity strength, adaptive techniques and equipment, NMR, cognitive perceptual and visual perceptual awareness.   Goals are: supervision to min assist. 9. SLP will assess and treat for/with: speech, cognition.  Goals are: supervision. 10. Case Management and Social Worker will assess and treat for psychological issues and discharge planning. 11. Team conference will be held weekly to assess progress toward goals and to determine barriers to discharge. 12. Patient will receive at least 3 hours of therapy per day at least 5 days per week. 13. ELOS: 14-17 days       14. Prognosis:  excellent   Medical Problem List and Plan: Hemorrhagic right basal ganglia infarcts  1. DVT Prophylaxis/Anticoagulation: Pharmaceutical: Lovenox 2. Pain Management: N/A 3. Mood:  No signs of distress noted. LCSW to follow for evaluation.  4. Neuropsych: This patient is capable of making decisions on his own behalf. 5. HTN: Monitor with qid checks. Continue HCTZ, Avapro and Norvasc.  6. BPH: continue proscar.    Meredith Staggers, MD, [facility]   08/07/2013

## 2013-08-06 NOTE — Progress Notes (Signed)
The Chaplain offered emotional and spiritual support to the patient today. The patient was stable and seemed to be in fair condition after he suffered a massive stroke this past Thursday. The patient's wife informed the Chaplain on her husband's medical timeline referring to when he will be getting released from the hospital. The Chaplain prayed with the patient and his wife in the remaining moments of their visit together.  Chaplain Clista Bernhardt Tasmin Exantus

## 2013-08-06 NOTE — Progress Notes (Signed)
IP Rehab  I met with the patient and his wife today at the bedside and discussed post acute rehab options.  They would like for me to pursue IP Rehab.  I will begin the insurance process with [employer] this am.     Gerlean Ren PT [facility] RN [phone]

## 2013-08-06 NOTE — Progress Notes (Signed)
Stroke Team Progress Note  HISTORY Jack [name] is a 80 y.o. male with a history of afib, previous stroke not on anticoagulation who presented 08/02/2013 with sudden onset left sided weakness and numbness that started at 9:45 that morning. He was in his normal state prior to this. HE was drving when he noticed the left sided weakness. He denied headache but had some nausea. CT showed right thalamic hemorrhage. His BP was 181/84 on arrival in the ED. He was enrolled in the ATACH-II trial for BP control. Patient was not administerd TPA secondary to hemorrhage. He was admitted to the neuro ICU for further evaluation and treatment.  SUBJECTIVE Family at bedside.  OBJECTIVE Most recent Vital Signs: Filed Vitals:   08/05/13 2000 08/06/13 0000 08/06/13 0400 08/06/13 0828  BP: 132/53 119/54 132/96 127/63  Pulse: 78 72 77 64  Temp: 98.1 F (36.7 C) 98.5 F (36.9 C) 98.1 F (36.7 C) 98.3 F (36.8 C)  TempSrc: Oral Oral Oral Oral  Resp: 15 15 16 16   Height:      Weight:      SpO2: 96% 98% 99% 94%   CBG (last 3)  No results found for this basename: GLUCAP,  in the last 72 hours  IV Fluid Intake:   . 0.9 % NaCl with KCl 20 mEq / L 50 mL/hr at 08/05/13 2015    MEDICATIONS  . amLODipine  5 mg Oral Daily  . finasteride  5 mg Oral Daily  . irbesartan  150 mg Oral Daily   And  . hydrochlorothiazide  12.5 mg Oral Daily  . pantoprazole  40 mg Oral Daily  . senna-docusate  1 tablet Oral BID   PRN:  acetaminophen, acetaminophen, labetalol, RESOURCE THICKENUP CLEAR  Diet:  Dysphagia 3 nectar thick liquids Activity:  As tolerated DVT Prophylaxis:  SCDs   CLINICALLY SIGNIFICANT STUDIES Basic Metabolic Panel:   Recent Labs Lab 08/03/13 0220 08/04/13 0501  NA 139 141  K 3.6* 4.1  CL 100 103  CO2 26 25  GLUCOSE 91 95  BUN 13 11  CREATININE 0.83 0.76  CALCIUM 8.6 8.7   Liver Function Tests:   Recent Labs Lab 08/02/13 1021  AST 20  ALT 13  ALKPHOS 42  BILITOT 0.4  PROT 7.6   ALBUMIN 4.0   CBC:  Recent Labs Lab 08/02/13 1021  08/03/13 0220 08/04/13 0501  WBC 11.3*  --  8.9 10.1  NEUTROABS 5.8  --   --   --   HGB 15.0  < > 12.5* 13.9  HCT 43.2  < > 36.6* 41.2  MCV 87.4  --  87.1 86.0  PLT 355  --  338 372  < > = values in this interval not displayed. Coagulation:   Recent Labs Lab 08/02/13 1021 08/03/13 0220 08/04/13 0501  LABPROT 13.0 15.2 15.0  INR 1.00 1.23 1.21   Cardiac Enzymes:   Recent Labs Lab 08/02/13 1021  TROPONINI <0.30   Urinalysis:   Recent Labs Lab 08/02/13 1206  COLORURINE YELLOW  LABSPEC 1.014  PHURINE 7.5  GLUCOSEU NEGATIVE  HGBUR NEGATIVE  BILIRUBINUR NEGATIVE  KETONESUR NEGATIVE  PROTEINUR NEGATIVE  UROBILINOGEN 0.2  NITRITE NEGATIVE  LEUKOCYTESUR NEGATIVE   Lipid Panel    Component Value Date/Time   CHOL  Value: 128        ATP III CLASSIFICATION:  <200     mg/dL   Desirable  200-239  mg/dL   Borderline High  >=240    mg/dL  High 12/12/2007 0430   TRIG 94 12/12/2007 0430   HDL 21* 12/12/2007 0430   CHOLHDL 6.1 12/12/2007 0430   VLDL 19 12/12/2007 0430   LDLCALC  Value: 88        Total Cholesterol/HDL:CHD Risk Coronary Heart Disease Risk Table                     Men   Women  1/2 Average Risk   3.4   3.3 12/12/2007 0430   HgbA1C  Lab Results  Component Value Date   HGBA1C  Value: 5.5 (NOTE)   The ADA recommends the following therapeutic goals for glycemic   control related to Hgb A1C measurement:   Goal of Therapy:   < 7.0% Hgb A1C   Action Suggested:  > 8.0% Hgb A1C   Ref:  Diabetes Care, 22, Suppl. 1, 1999 12/12/2007    Urine Drug Screen:     Component Value Date/Time   LABOPIA NONE DETECTED 08/02/2013 1206   COCAINSCRNUR NONE DETECTED 08/02/2013 1206   LABBENZ NONE DETECTED 08/02/2013 1206   AMPHETMU NONE DETECTED 08/02/2013 1206   THCU NONE DETECTED 08/02/2013 1206   LABBARB NONE DETECTED 08/02/2013 1206    Alcohol Level:   Recent Labs Lab 08/02/13 1021  ETH <11    CT of the brain   08/04/2013 Similar  appearance of intracranial hemorrhage involving the posterior aspect of the right lenticular nucleus and right thalamus  With urrounding mild vasogenic edema. Mild local mass effect upon the right lateral ventricle. No midline shift. No new intracranial hemorrhage noted. 08/02/2013    1. Hemorrhagic infarcts involving the posterior right basal ganglia as described. 2. Interval nonhemorrhagic infarct of the left parietal lobe does not appear acute. 3. Progression of chronic microvascular ischemic changes.   MRI of the brain  not indicated  MRA of the brain  not indicated  2D Echocardiogram  not indicated  Carotid Doppler  not indicated  CXR    EKG  atrial fibrillation, rate 106. For complete results please see formal report.   Therapy Recommendations inpatient rehabilitation recommended. Rehabilitation M.D. consulted and agrees.  Physical Exam General: The patient is alert and cooperative at the time of the examination. Respiratory: Lung fields are clear. Cardiovascular: Occasionally irregular rhythm, no murmurs Abdomen: Positive bowel sounds, nontender Skin: No significant peripheral edema is noted.  Neurologic Exam Mental status: The patient is alert, fully cooperative. Cranial nerves: Facial symmetry is present. Speech is dysarthric. Extraocular movements are full, visual fields are full. Decreased sensation on the left face as compared to the right to soft touch. Motor: The patient has good strength on the right extremities. On the left, the patient has drift of the left arm to the bed. 4-/5 strength on the left arm, 4/5 strength of the left leg Sensory examination: Decreased soft touch sensation on the left leg as compared to the right, symmetric on the arms  Coordination: The patient has good finger-nose-finger and heel-to-shin on the right arm and leg. On the left leg, the patient is able to perform toe to finger with some dysmetria. The patient has difficulty performing  finger-nose-finger with the left arm. Gait and station: The gait cannot be tested. Reflexes: Deep tendon reflexes are symmetric.    ASSESSMENT Jack [name] is a 80 y.o. male presenting with left sided weakness. Imaging confirms a right thalamic hemorrhage. Hemorrhage felt to be embolic secondary to accelerated hypertension with SBP 182 on arrival - BP was lowered  using cardene. He was enrolled in the ATACH-II study for BP control.  CT now with vasogenic edema; hemorrhage stable. On aspirin 81 mg orally every day prior to admission. No antithrombotics at this time secondary to bleed. Patient with resultant left hemiparesis, dysphagia. Work up completed.  atrial fibrillation, not on anticoagulants prior to admission, not an anticoagulant candidate now due to hemorrhage Hypertension Hyperlipidemia  (lipid panel from 2009) on no statin PTA - recheck lipid panel. Known PFO Hx stroke 2009   Hospital day # 4  TREATMENT/PLAN  Medically stable for transfer to inpatient rehabilitation when bed available.  Repeat CT in 2-3 weeks, consider antiplatelet therapy with low dose aspirin once blood isodense  Burnetta Sabin, MSN, RN, ANVP-BC, ANP-BC, GNP-BC [facility] Pager: [phone] 08/06/2013 8:45 AM  I have personally obtained a history, examined the patient, evaluated imaging results, and formulated the assessment and plan of care. I agree with the above.  Antony Contras, MD

## 2013-08-06 NOTE — Progress Notes (Signed)
Speech Language Pathology Treatment: Dysphagia  Patient Details Name: Jack [name] MRN: [id number] DOB: 04-12-1934 Today's Date: 08/06/2013 Time: 8295-6213 SLP Time Calculation (min): 17 min  Assessment / Plan / Recommendation Clinical Impression  Patient continues to cough after small sips of thin liquids, but does not like thickener.  Recommend MBS for objective evaluation of swallowing, and to determine if any compensatory strategies will decreased aspiraiton risks.  Pt. And family agree with MBS.   HPI HPI: 80 y.o. male with a history of afib, previous stroke not on anticoagulation who presents with sudden onset left sided weakness and numbness that started at 9:45 this morning. He was in his normal state prior to this. HE was drving when he noticed the left sided weakness.  PMH:  HTN, CVA, history of patent foramen ovale, cellulitis of face.  CT 1/22 Hemorrhagic infarcts involving the posterior right basal ganglia as described, Interval nonhemorrhagic infarct of the left parietal lobe does not appear acute, progression of chronic microvascular ischemic changes.   Pertinent Vitals Afebrile; LS not documented.  SLP Plan  MBS    Recommendations Diet recommendations: Dysphagia 3 (mechanical soft);Nectar-thick liquid Liquids provided via: Cup Medication Administration: Whole meds with puree Supervision: Patient able to self feed;Full supervision/cueing for compensatory strategies Compensations: Slow rate;Small sips/bites Postural Changes and/or Swallow Maneuvers: Seated upright 90 degrees              Oral Care Recommendations: Oral care BID Follow up Recommendations: Inpatient Rehab Plan: MBS    GO Functional Limitations: Swallowing   Quinn Axe T 08/06/2013, 12:46 PM

## 2013-08-06 NOTE — Progress Notes (Addendum)
Physical Therapy Treatment Patient Details Name: Jack [name] MRN: [id number] DOB: 1934-04-22 Today's Date: 08/06/2013 Time: 3428-7681 PT Time Calculation (min): 25 min  PT Assessment / Plan / Recommendation  History of Present Illness 80 y.o. male with a history of afib, previous stroke not on anticoagulation who presents with sudden onset left sided weakness and numbness that started at 9:45 this morning. He was in his normal state prior to this. HE was drving when he noticed the left sided weakness.  PMH:  HTN, CVA, history of patent foramen ovale, cellulitis of face.  CT 1/22 Hemorrhagic infarcts involving the posterior right basal ganglia as described, Interval nonhemorrhagic infarct of the left parietal lobe does not appear acute, progression of chronic microvascular ischemic changes.    PT Comments   Pt making good progress. Continue to recommend comprehensive inpatient rehab (CIR) for post-acute therapy needs.   Follow Up Recommendations  CIR     Does the patient have the potential to tolerate intense rehabilitation     Barriers to Discharge        Equipment Recommendations  Other (comment) (TBD)    Recommendations for Other Services    Frequency Min 4X/week   Progress towards PT Goals Progress towards PT goals: Progressing toward goals  Plan Current plan remains appropriate    Precautions / Restrictions Precautions Precautions: Fall   Pertinent Vitals/Pain No pain.    Mobility  Bed Mobility Supine to sit: Mod assist;HOB elevated General bed mobility comments: Assist to bring lt leg off and to elevate trunk. Transfers Overall transfer level: Needs assistance Equipment used: 2 person hand held assist;Rolling walker (2 wheeled) Sit to Stand: Mod assist;+2 safety/equipment Stand pivot transfers: +2 physical assistance;Mod assist General transfer comment: Performed sit to stand multiple times. Stabilization of lt knee. Pivoted to rt with bil hand held and assist to  support lt knee and to pivot lt leg. Stood with walker x 1 but pt not able to grasp walker with lt hand. Modified Rankin (Stroke Patients Only) Pre-Morbid Rankin Score: No symptoms Modified Rankin: Severe disability    Exercises     PT Diagnosis:    PT Problem List:   PT Treatment Interventions:     PT Goals (current goals can now be found in the care plan section)    Visit Information  Last PT Received On: 08/06/13 Assistance Needed: +2 History of Present Illness: 80 y.o. male with a history of afib, previous stroke not on anticoagulation who presents with sudden onset left sided weakness and numbness that started at 9:45 this morning. He was in his normal state prior to this. HE was drving when he noticed the left sided weakness.  PMH:  HTN, CVA, history of patent foramen ovale, cellulitis of face.  CT 1/22 Hemorrhagic infarcts involving the posterior right basal ganglia as described, Interval nonhemorrhagic infarct of the left parietal lobe does not appear acute, progression of chronic microvascular ischemic changes.     Subjective Data      Cognition  Cognition Arousal/Alertness: Awake/alert Behavior During Therapy: Impulsive Overall Cognitive Status: Impaired/Different from baseline Current Attention Level: Selective Following Commands: Follows one step commands consistently;Follows one step commands with increased time Safety/Judgement: Decreased awareness of deficits;Decreased awareness of safety Problem Solving: Difficulty sequencing;Slow processing;Requires verbal cues;Requires tactile cues    Balance  Balance Sitting-balance support: Bilateral upper extremity supported;Feet supported Sitting balance-Leahy Scale: Fair Sitting balance - Comments: Pt required verbal cues to  find and maintain midline. Postural control: Left lateral lean;Posterior lean  Standing balance support: Bilateral upper extremity supported Standing balance-Leahy Scale: Poor Standing balance  comment: Pt stood x 3 for 1-4 minutes each time.  Facilitation of lt knee to maintain extension without hyperextension.  Pt with lean to left.  Facilitation to shift back to midline.  End of Session PT - End of Session Equipment Utilized During Treatment: Gait belt Activity Tolerance: Patient tolerated treatment well Patient left: in chair;with call bell/phone within reach;with chair alarm set Nurse Communication: Mobility status   GP     [facility] [facility] 08/06/2013, 9:54 AM  [facility] PT [phone]

## 2013-08-06 NOTE — Procedures (Signed)
Objective Swallowing Evaluation: Modified Barium Swallowing Study  Patient Details  Name: Jack [name] MRN: [id number] Date of Birth: 02/02/1934  Today's Date: 08/06/2013 Time: 1300-1330 SLP Time Calculation (min): 30 min  Past Medical History:  Past Medical History  Diagnosis Date  . Hypertension   . ED (erectile dysfunction)   . CVA (cerebral infarction) 2009  . Atrial fibrillation     followed by Dr. Wynonia Lawman  . Hyperlipidemia   . Patent foramen ovale     history of  . Microscopic hematuria   . Elevated PSA     [facility], Dr. Serita Butcher;   . Cellulitis of face 11/08    [facility]   Past Surgical History:  Past Surgical History  Procedure Laterality Date  . Mitral valve repair     HPI:  80 y.o. male with a history of afib, previous stroke not on anticoagulation who presents with sudden onset left sided weakness and numbness that started at 9:45 this morning. He was in his normal state prior to this. HE was drving when he noticed the left sided weakness.  PMH:  HTN, CVA, history of patent foramen ovale, cellulitis of face.  CT 1/22 Hemorrhagic infarcts involving the posterior right basal ganglia as described, Interval nonhemorrhagic infarct of the left parietal lobe does not appear acute, progression of chronic microvascular ischemic changes.     Assessment / Plan / Recommendation Clinical Impression  Dysphagia Diagnosis: Mild oral phase dysphagia Clinical impression: Patient exhibits a mild oropharyngeal dysphagia characterized by reduced tongue strength for chewing and manipulating solid foods, intermittent reduced laryngeal elevation with only one time penetration of thin liquids, which cleared spontaneously.  Strict aspiration precautions should be continued, as there was no cough during this study, but pt did cough with thin liquids prior to coming down for this study.  Pt. may be intermittently aspirating with thin, therefore, close monitoring is recommended.     Treatment Recommendation  Therapy as outlined in treatment plan below    Diet Recommendation Dysphagia 3 (Mechanical Soft);Thin liquid (as tolerated)   Liquid Administration via: Cup;No straw Medication Administration: Whole meds with puree Supervision: Patient able to self feed;Full supervision/cueing for compensatory strategies Compensations: Slow rate;Small sips/bites Postural Changes and/or Swallow Maneuvers: Seated upright 90 degrees    Other  Recommendations Oral Care Recommendations: Oral care BID Other Recommendations: Clarify dietary restrictions   Follow Up Recommendations  Inpatient Rehab    Frequency and Duration min 2x/week  2 weeks   Pertinent Vitals/Pain No cough with flash penetration; No coughing throughout MBS, but did cough with thins at bedside.    SLP Swallow Goals     General HPI: 80 y.o. male with a history of afib, previous stroke not on anticoagulation who presents with sudden onset left sided weakness and numbness that started at 9:45 this morning. He was in his normal state prior to this. HE was drving when he noticed the left sided weakness.  PMH:  HTN, CVA, history of patent foramen ovale, cellulitis of face.  CT 1/22 Hemorrhagic infarcts involving the posterior right basal ganglia as described, Interval nonhemorrhagic infarct of the left parietal lobe does not appear acute, progression of chronic microvascular ischemic changes. Type of Study: Modified Barium Swallowing Study Reason for Referral: Objectively evaluate swallowing function Previous Swallow Assessment: BSE 08/03/13 Dys 3 with Nectar thick liquids; MBS in 2009 Diet Prior to this Study: Dysphagia 3 (soft);Nectar-thick liquids Temperature Spikes Noted: No Respiratory Status: Room air History of Recent Intubation: No Behavior/Cognition: Alert;Cooperative;Pleasant  mood Oral Cavity - Dentition: Adequate natural dentition Oral Motor / Sensory Function: Within functional limits Self-Feeding  Abilities: Able to feed self Patient Positioning: Upright in chair Baseline Vocal Quality: Clear Volitional Cough: Strong Volitional Swallow: Able to elicit Anatomy: Within functional limits Pharyngeal Secretions: Not observed secondary MBS    Reason for Referral Objectively evaluate swallowing function   Oral Phase Oral Preparation/Oral Phase Oral Phase: WFL   Pharyngeal Phase Pharyngeal Phase Pharyngeal Phase: Impaired Pharyngeal - Thin Pharyngeal - Thin Cup: Reduced laryngeal elevation;Penetration/Aspiration during swallow Penetration/Aspiration details (thin cup): Material enters airway, remains ABOVE vocal cords then ejected out  Cervical Esophageal Phase    GO    Cervical Esophageal Phase Cervical Esophageal Phase: [facility]    Functional Limitations: Swallowing    Quinn Axe T 08/06/2013, 2:11 PM

## 2013-08-07 ENCOUNTER — Inpatient Hospital Stay (HOSPITAL_COMMUNITY)
Admission: AD | Admit: 2013-08-07 | Discharge: 2013-08-30 | DRG: 945 | Disposition: A | Discharge status: Home Health | Payer: Medicare Other | Source: Intra-hospital | Attending: Physical Medicine & Rehabilitation | Admitting: Physical Medicine & Rehabilitation

## 2013-08-07 ENCOUNTER — Encounter (HOSPITAL_COMMUNITY): Payer: Self-pay | Admitting: Rehabilitation

## 2013-08-07 DIAGNOSIS — I10 Essential (primary) hypertension: Secondary | ICD-10-CM | POA: Diagnosis present

## 2013-08-07 DIAGNOSIS — G936 Cerebral edema: Secondary | ICD-10-CM | POA: Diagnosis present

## 2013-08-07 DIAGNOSIS — G4700 Insomnia, unspecified: Secondary | ICD-10-CM | POA: Diagnosis present

## 2013-08-07 DIAGNOSIS — E785 Hyperlipidemia, unspecified: Secondary | ICD-10-CM | POA: Diagnosis present

## 2013-08-07 DIAGNOSIS — E876 Hypokalemia: Secondary | ICD-10-CM | POA: Diagnosis not present

## 2013-08-07 DIAGNOSIS — N400 Enlarged prostate without lower urinary tract symptoms: Secondary | ICD-10-CM | POA: Diagnosis present

## 2013-08-07 DIAGNOSIS — Z8673 Personal history of transient ischemic attack (TIA), and cerebral infarction without residual deficits: Secondary | ICD-10-CM

## 2013-08-07 DIAGNOSIS — I4891 Unspecified atrial fibrillation: Secondary | ICD-10-CM | POA: Diagnosis present

## 2013-08-07 DIAGNOSIS — R471 Dysarthria and anarthria: Secondary | ICD-10-CM | POA: Diagnosis present

## 2013-08-07 DIAGNOSIS — I619 Nontraumatic intracerebral hemorrhage, unspecified: Secondary | ICD-10-CM | POA: Diagnosis present

## 2013-08-07 DIAGNOSIS — H9190 Unspecified hearing loss, unspecified ear: Secondary | ICD-10-CM | POA: Diagnosis present

## 2013-08-07 DIAGNOSIS — Z5189 Encounter for other specified aftercare: Principal | ICD-10-CM

## 2013-08-07 DIAGNOSIS — G8190 Hemiplegia, unspecified affecting unspecified side: Secondary | ICD-10-CM | POA: Diagnosis present

## 2013-08-07 DIAGNOSIS — R1312 Dysphagia, oropharyngeal phase: Secondary | ICD-10-CM | POA: Diagnosis present

## 2013-08-07 DIAGNOSIS — I609 Nontraumatic subarachnoid hemorrhage, unspecified: Secondary | ICD-10-CM

## 2013-08-07 MED ORDER — FLEET ENEMA 7-19 GM/118ML RE ENEM: 1.0000 | ENEMA | Freq: Once | RECTAL | Status: AC | PRN

## 2013-08-07 MED ORDER — BISACODYL 10 MG RE SUPP: 10.0000 mg | Freq: Every day | RECTAL | Status: DC | PRN

## 2013-08-07 MED ORDER — DIPHENHYDRAMINE HCL 12.5 MG/5ML PO ELIX
12.5000 mg | ORAL_SOLUTION | Freq: Four times a day (QID) | ORAL | Status: DC | PRN
  Administered 2013-08-07: 25 mg via ORAL
  Filled 2013-08-07 (×2): qty 10

## 2013-08-07 MED ORDER — GUAIFENESIN-DM 100-10 MG/5ML PO SYRP: 5.0000 mL | ORAL_SOLUTION | Freq: Four times a day (QID) | ORAL | Status: DC | PRN

## 2013-08-07 MED ORDER — PANTOPRAZOLE SODIUM 40 MG PO TBEC
40.0000 mg | DELAYED_RELEASE_TABLET | Freq: Every day | ORAL | Status: DC
  Administered 2013-08-08 – 2013-08-30 (×23): 40 mg via ORAL
  Filled 2013-08-07 (×25): qty 1

## 2013-08-07 MED ORDER — ALUM & MAG HYDROXIDE-SIMETH 200-200-20 MG/5ML PO SUSP: 30.0000 mL | ORAL | Status: DC | PRN

## 2013-08-07 MED ORDER — AMLODIPINE BESYLATE 5 MG PO TABS
5.0000 mg | ORAL_TABLET | Freq: Every day | ORAL | Status: DC
  Administered 2013-08-08 – 2013-08-30 (×23): 5 mg via ORAL
  Filled 2013-08-07 (×25): qty 1

## 2013-08-07 MED ORDER — PROCHLORPERAZINE EDISYLATE 5 MG/ML IJ SOLN
5.0000 mg | Freq: Four times a day (QID) | INTRAMUSCULAR | Status: DC | PRN
  Filled 2013-08-07: qty 2

## 2013-08-07 MED ORDER — DIPHENHYDRAMINE-ZINC ACETATE 2-0.1 % EX CREA
TOPICAL_CREAM | Freq: Two times a day (BID) | CUTANEOUS | Status: DC | PRN
  Administered 2013-08-08 – 2013-08-09 (×2): via TOPICAL
  Administered 2013-08-09: 1 via TOPICAL
  Administered 2013-08-15 – 2013-08-29 (×5): via TOPICAL
  Filled 2013-08-07 (×2): qty 28

## 2013-08-07 MED ORDER — SENNOSIDES-DOCUSATE SODIUM 8.6-50 MG PO TABS
1.0000 | ORAL_TABLET | Freq: Two times a day (BID) | ORAL | Status: DC
  Administered 2013-08-07 – 2013-08-23 (×26): 1 via ORAL
  Filled 2013-08-07 (×50): qty 1

## 2013-08-07 MED ORDER — TRAZODONE HCL 50 MG PO TABS
25.0000 mg | ORAL_TABLET | Freq: Every evening | ORAL | Status: DC | PRN
  Administered 2013-08-08 – 2013-08-20 (×6): 50 mg via ORAL
  Filled 2013-08-07 (×6): qty 1

## 2013-08-07 MED ORDER — PROCHLORPERAZINE MALEATE 5 MG PO TABS
5.0000 mg | ORAL_TABLET | Freq: Four times a day (QID) | ORAL | Status: DC | PRN
  Filled 2013-08-07: qty 2

## 2013-08-07 MED ORDER — ENOXAPARIN SODIUM 40 MG/0.4ML ~~LOC~~ SOLN
40.0000 mg | SUBCUTANEOUS | Status: DC
  Administered 2013-08-07 – 2013-08-29 (×23): 40 mg via SUBCUTANEOUS
  Filled 2013-08-07 (×24): qty 0.4

## 2013-08-07 MED ORDER — FINASTERIDE 5 MG PO TABS
5.0000 mg | ORAL_TABLET | Freq: Every day | ORAL | Status: DC
  Administered 2013-08-08 – 2013-08-30 (×23): 5 mg via ORAL
  Filled 2013-08-07 (×25): qty 1

## 2013-08-07 MED ORDER — IRBESARTAN 150 MG PO TABS
150.0000 mg | ORAL_TABLET | Freq: Every day | ORAL | Status: DC
  Administered 2013-08-08 – 2013-08-30 (×23): 150 mg via ORAL
  Filled 2013-08-07 (×25): qty 1

## 2013-08-07 MED ORDER — ENOXAPARIN SODIUM 40 MG/0.4ML ~~LOC~~ SOLN
40.0000 mg | SUBCUTANEOUS | Status: DC
  Administered 2013-08-07: 40 mg via SUBCUTANEOUS
  Filled 2013-08-07: qty 0.4

## 2013-08-07 MED ORDER — PROCHLORPERAZINE 25 MG RE SUPP
12.5000 mg | Freq: Four times a day (QID) | RECTAL | Status: DC | PRN
  Filled 2013-08-07: qty 1

## 2013-08-07 MED ORDER — HYDROCHLOROTHIAZIDE 12.5 MG PO CAPS
12.5000 mg | ORAL_CAPSULE | Freq: Every day | ORAL | Status: DC
  Administered 2013-08-08 – 2013-08-30 (×23): 12.5 mg via ORAL
  Filled 2013-08-07 (×25): qty 1

## 2013-08-07 MED ORDER — ACETAMINOPHEN 325 MG PO TABS
325.0000 mg | ORAL_TABLET | ORAL | Status: DC | PRN
  Administered 2013-08-07: 650 mg via ORAL
  Filled 2013-08-07 (×4): qty 2

## 2013-08-07 MED ORDER — POLYETHYLENE GLYCOL 3350 17 G PO PACK
17.0000 g | PACK | Freq: Every day | ORAL | Status: DC | PRN
  Filled 2013-08-07: qty 1

## 2013-08-07 NOTE — Interval H&P Note (Signed)
Jack [name] was admitted today to Inpatient Rehabilitation with the diagnosis of right basal ganglia CVA.  The patient's history has been reviewed, patient examined, and there is no change in status.  Patient continues to be appropriate for intensive inpatient rehabilitation.  I have reviewed the patient's chart and labs.  Questions were answered to the patient's satisfaction.  Aniyia Rane T 08/07/2013, 7:53 PM

## 2013-08-07 NOTE — Progress Notes (Signed)
Stroke Team Progress Note  HISTORY Jack [name] is a 80 y.o. male with a history of afib, previous stroke not on anticoagulation who presented 08/02/2013 with sudden onset left sided weakness and numbness that started at 9:45 that morning. He was in his normal state prior to this. HE was drving when he noticed the left sided weakness. He denied headache but had some nausea. CT showed right thalamic hemorrhage. His BP was 181/84 on arrival in the ED. He was enrolled in the ATACH-II trial for BP control. Patient was not administerd TPA secondary to hemorrhage. He was admitted to the neuro ICU for further evaluation and treatment.  SUBJECTIVE Family at bedside.  OBJECTIVE Most recent Vital Signs: Filed Vitals:   08/06/13 1600 08/06/13 2009 08/06/13 2358 08/07/13 0415  BP: 130/55 133/62 144/61 134/75  Pulse: 74 80 88 77  Temp: 98.6 F (37 C) 98.5 F (36.9 C) 99.3 F (37.4 C) 97.4 F (36.3 C)  TempSrc: Oral Oral Oral Oral  Resp: 16 18 17 17   Height:      Weight:      SpO2: 93% 98% 96% 96%   CBG (last 3)  No results found for this basename: GLUCAP,  in the last 72 hours  IV Fluid Intake:   . 0.9 % NaCl with KCl 20 mEq / L 50 mL/hr at 08/05/13 2015    MEDICATIONS  . amLODipine  5 mg Oral Daily  . finasteride  5 mg Oral Daily  . irbesartan  150 mg Oral Daily   And  . hydrochlorothiazide  12.5 mg Oral Daily  . pantoprazole  40 mg Oral Daily  . senna-docusate  1 tablet Oral BID   PRN:  acetaminophen, acetaminophen, diphenhydrAMINE-zinc acetate, labetalol, RESOURCE THICKENUP CLEAR  Diet:  Dysphagia 3 nectar thick liquids Activity:  As tolerated DVT Prophylaxis:  SCDs   CLINICALLY SIGNIFICANT STUDIES Basic Metabolic Panel:   Recent Labs Lab 08/03/13 0220 08/04/13 0501  NA 139 141  K 3.6* 4.1  CL 100 103  CO2 26 25  GLUCOSE 91 95  BUN 13 11  CREATININE 0.83 0.76  CALCIUM 8.6 8.7   Liver Function Tests:   Recent Labs Lab 08/02/13 1021  AST 20  ALT 13  ALKPHOS 42   BILITOT 0.4  PROT 7.6  ALBUMIN 4.0   CBC:  Recent Labs Lab 08/02/13 1021  08/03/13 0220 08/04/13 0501  WBC 11.3*  --  8.9 10.1  NEUTROABS 5.8  --   --   --   HGB 15.0  < > 12.5* 13.9  HCT 43.2  < > 36.6* 41.2  MCV 87.4  --  87.1 86.0  PLT 355  --  338 372  < > = values in this interval not displayed. Coagulation:   Recent Labs Lab 08/02/13 1021 08/03/13 0220 08/04/13 0501  LABPROT 13.0 15.2 15.0  INR 1.00 1.23 1.21   Cardiac Enzymes:   Recent Labs Lab 08/02/13 1021  TROPONINI <0.30   Urinalysis:   Recent Labs Lab 08/02/13 1206  COLORURINE YELLOW  LABSPEC 1.014  PHURINE 7.5  GLUCOSEU NEGATIVE  HGBUR NEGATIVE  BILIRUBINUR NEGATIVE  KETONESUR NEGATIVE  PROTEINUR NEGATIVE  UROBILINOGEN 0.2  NITRITE NEGATIVE  LEUKOCYTESUR NEGATIVE   Lipid Panel    Component Value Date/Time   CHOL  Value: 128        ATP III CLASSIFICATION:  <200     mg/dL   Desirable  200-239  mg/dL   Borderline High  >=240  mg/dL   High 12/12/2007 0430   TRIG 94 12/12/2007 0430   HDL 21* 12/12/2007 0430   CHOLHDL 6.1 12/12/2007 0430   VLDL 19 12/12/2007 0430   LDLCALC  Value: 88        Total Cholesterol/HDL:CHD Risk Coronary Heart Disease Risk Table                     Men   Women  1/2 Average Risk   3.4   3.3 12/12/2007 0430   HgbA1C  Lab Results  Component Value Date   HGBA1C  Value: 5.5 (NOTE)   The ADA recommends the following therapeutic goals for glycemic   control related to Hgb A1C measurement:   Goal of Therapy:   < 7.0% Hgb A1C   Action Suggested:  > 8.0% Hgb A1C   Ref:  Diabetes Care, 22, Suppl. 1, 1999 12/12/2007    Urine Drug Screen:     Component Value Date/Time   LABOPIA NONE DETECTED 08/02/2013 1206   COCAINSCRNUR NONE DETECTED 08/02/2013 1206   LABBENZ NONE DETECTED 08/02/2013 1206   AMPHETMU NONE DETECTED 08/02/2013 1206   THCU NONE DETECTED 08/02/2013 1206   LABBARB NONE DETECTED 08/02/2013 1206    Alcohol Level:   Recent Labs Lab 08/02/13 1021  ETH <11    CT of the  brain   08/04/2013 Similar appearance of intracranial hemorrhage involving the posterior aspect of the right lenticular nucleus and right thalamus  With urrounding mild vasogenic edema. Mild local mass effect upon the right lateral ventricle. No midline shift. No new intracranial hemorrhage noted. 08/02/2013    1. Hemorrhagic infarcts involving the posterior right basal ganglia as described. 2. Interval nonhemorrhagic infarct of the left parietal lobe does not appear acute. 3. Progression of chronic microvascular ischemic changes.   MRI of the brain  not indicated  MRA of the brain  not indicated  2D Echocardiogram  not indicated  Carotid Doppler  not indicated  CXR    EKG  atrial fibrillation, rate 106. For complete results please see formal report.   Therapy Recommendations inpatient rehabilitation recommended. Rehabilitation M.D. consulted and agrees.  Physical Exam General: The patient is alert and cooperative at the time of the examination. Respiratory: Lung fields are clear. Cardiovascular: Occasionally irregular rhythm, no murmurs Abdomen: Positive bowel sounds, nontender Skin: No significant peripheral edema is noted.  Neurologic Exam Mental status: The patient is alert, fully cooperative. Cranial nerves: Facial symmetry is present. Speech is dysarthric. Extraocular movements are full, visual fields are full. Decreased sensation on the left face as compared to the right to soft touch. Motor: The patient has good strength on the right extremities. On the left, the patient has drift of the left arm to the bed. 4-/5 strength on the left arm, 4/5 strength of the left leg Sensory examination: Decreased soft touch sensation on the left leg as compared to the right, symmetric on the arms  Coordination: The patient has good finger-nose-finger and heel-to-shin on the right arm and leg. On the left leg, the patient is able to perform toe to finger with some dysmetria. The patient has  difficulty performing finger-nose-finger with the left arm. Gait and station: The gait cannot be tested. Reflexes: Deep tendon reflexes are symmetric.   ASSESSMENT Jack [name] is a 80 y.o. male presenting with left sided weakness. Imaging confirms a right thalamic hemorrhage. Hemorrhage felt to be embolic secondary to accelerated hypertension with SBP 182 on arrival - BP  was lowered using cardene. He was enrolled in the ATACH-II study for BP control.  CT now with vasogenic edema; hemorrhage stable. On aspirin 81 mg orally every day prior to admission. No antithrombotics at this time secondary to bleed. Patient with resultant left hemiparesis, dysphagia. Work up completed.  atrial fibrillation, not on anticoagulants prior to admission, not an anticoagulant candidate now due to hemorrhage Hypertension Hyperlipidemia  (lipid panel from 2009) on no statin PTA - recheck lipid panel. Known PFO Hx stroke 2009   Hospital day # 5  TREATMENT/PLAN  Medically stable for transfer to inpatient rehabilitation when bed available. Await insurance approval - phone appt at 10a  D/c SCDs. lovenox for VTE  Repeat CT in 1-2 weeks, consider antiplatelet therapy with low dose aspirin once blood isodense  Burnetta Sabin, MSN, RN, ANVP-BC, ANP-BC, GNP-BC [facility] Pager: [phone] 08/07/2013 9:37 AM  I have personally obtained a history, examined the patient, evaluated imaging results, and formulated the assessment and plan of care. I agree with the above. Antony Contras, MD

## 2013-08-07 NOTE — PMR Pre-admission (Signed)
PMR Admission Coordinator Pre-Admission Assessment  Patient: Jack [name] is an 80 y.o., male MRN: [id number] DOB: Jul 31, 1933 Height: 5\' 3"  (160 cm) Weight: 65.772 kg (145 lb)              Insurance Information HMO:     PPO: yes     PCP:      IPA:      80/20:      OTHER:   PRIMARY:  [employer] Medicare Complete      Policy#: [id number]      Subscriber:  self CM Name: Greig Right      Phone#: [phone]     Fax#: [id number] Pre-Cert#: [id number]  follow up onsite case manager Gaylan Gerold  [phone]  Employer:  retired Benefits:  Phone #: [phone]     Name:  Irene Shipper. Date: 07/12/12     Deduct: $200      Out of Pocket Max: $2200      Life Max: none CIR: 100%      SNF: 100% Outpatient: 100%     Co-Pay:  none Home Health: 100%  , auth required after 60 days    Co-Pay: none DME: 100%     Co-Pay: none Providers in network SECONDARY: none          Emergency Contact Information Contact Information   Name Relation Home Work Mobile   [place]       Current Medical History  Patient Admitting Diagnosis: hemorrhagic right basal ganglia infarcts   History of Present Illness: Jack [name] is a 80 y.o. right-handed male with history of atrial fibrillation, hypertension, PFO, CVA 2009 not on anticoagulations and very little residual weakness. Admitted 08/02/2013 with left-sided weakness. Cranial CT scan showed 17 x 21 hemorrhagic infarcts posterior right basal ganglia and progression of microvascular disease. His systolic blood pressure was 182/84 on arrival. Followup  cranial CT scan 08/04/2013 with stable hypertensive hemorrhage  and mild vasogenic edema. Patient enrolled in ATACH-II study for BP control. Neurology recommends repeat CCT in 2-3 weeks to decide on antiplatelet therapy. MBS done yesterday and patient advanced to D3, thin liquids due to oropharyngeal dysphagia. Patient with resultant left hemiparesis and dysphagia. Rehab team recommended CIR and patient  admitted today  Total: 6 NIH    Past Medical History  Past Medical History  Diagnosis Date  . Hypertension   . ED (erectile dysfunction)   . CVA (cerebral infarction) 2009  . Atrial fibrillation     followed by Dr. Wynonia Lawman  . Hyperlipidemia   . Patent foramen ovale     history of  . Microscopic hematuria   . Elevated PSA     [facility], Dr. Serita Butcher;   . Cellulitis of face 11/08    [facility]    Family History  family history is not on file.  Prior Rehab/Hospitalizations: wife reports cardiac rehab following open heart surgery in 2003   Current Medications  Current facility-administered medications:0.9 % NaCl with KCl 20 mEq/ L  infusion, , Intravenous, Continuous, Kathrynn Ducking, MD, Last Rate: 50 mL/hr at 08/05/13 2015;  acetaminophen (TYLENOL) suppository 650 mg, 650 mg, Rectal, Q4H PRN, Roland Rack, MD;  acetaminophen (TYLENOL) tablet 650 mg, 650 mg, Oral, Q4H PRN, Roland Rack, MD;  amLODipine (NORVASC) tablet 5 mg, 5 mg, Oral, Daily, Donzetta Starch, NP, 5 mg at 08/07/13 1012 diphenhydrAMINE-zinc acetate (BENADRYL) 2-0.1 % cream, , Topical, BID PRN, Donzetta Starch, NP;  enoxaparin (LOVENOX) injection 40 mg, 40 mg,  Subcutaneous, Q24H, Donzetta Starch, NP, 40 mg at 08/07/13 1014;  finasteride (PROSCAR) tablet 5 mg, 5 mg, Oral, Daily, Roland Rack, MD, 5 mg at 08/07/13 1012;  hydrochlorothiazide (MICROZIDE) capsule 12.5 mg, 12.5 mg, Oral, Daily, Garvin Fila, MD, 12.5 mg at 08/07/13 1012 irbesartan (AVAPRO) tablet 150 mg, 150 mg, Oral, Daily, Garvin Fila, MD, 150 mg at 08/07/13 1012;  labetalol (NORMODYNE,TRANDATE) injection 10-40 mg, 10-40 mg, Intravenous, Q10 min PRN, Roland Rack, MD;  pantoprazole (PROTONIX) EC tablet 40 mg, 40 mg, Oral, Daily, Garvin Fila, MD, 40 mg at 08/07/13 1012;  RESOURCE THICKENUP CLEAR, , Oral, PRN, Garvin Fila, MD senna-docusate (Senokot-S) tablet 1 tablet, 1 tablet, Oral, BID, Roland Rack, MD, 1  tablet at 08/07/13 1012  Patients Current Diet: Dysphagia 3 , thin liquids  Precautions / Restrictions Precautions Precautions: Fall Precautions/Special Needs: Swallowing Precaution Comments: Lt sided weakness and ataxic like movements  Restrictions Weight Bearing Restrictions: No   Prior Activity Level Community (5-7x/wk): Pt. independent and driving PTA  Home Assistive Devices / Equipment Home Assistive Devices/Equipment: Eyeglasses Home Equipment: None  Prior Functional Level Prior Function Level of Independence: Independent Comments: pt driving and completely independent with ADLs.   Pt was working 32 hours/wk at [employer]   Current Functional Level Cognition  Arousal/Alertness: Awake/alert Overall Cognitive Status: Impaired/Different from baseline Current Attention Level: Selective Orientation Level: Oriented X4 Following Commands: Follows one step commands consistently Safety/Judgement: Decreased awareness of deficits;Decreased awareness of safety General Comments: pt very impulsive with functional tasks    Extremity Assessment (includes Sensation/Coordination)          ADLs  Eating/Feeding: Minimal assistance Where Assessed - Eating/Feeding: Bed level Grooming: Brushing hair;Maximal assistance;Minimal assistance Where Assessed - Grooming: Unsupported sitting Upper Body Bathing: Moderate assistance Where Assessed - Upper Body Bathing: Supported sitting Lower Body Bathing: Maximal assistance Where Assessed - Lower Body Bathing: Supported sit to stand Upper Body Dressing: Maximal assistance Where Assessed - Upper Body Dressing: Supported sitting Lower Body Dressing: +1 Total assistance Where Assessed - Lower Body Dressing: Supported sit to Lobbyist: Maximal assistance Toilet Transfer Method: Sit to Loss adjuster, chartered: Bedside commode Toileting - Clothing Manipulation and Hygiene: +1 Total assistance Where Assessed -  Toileting Clothing Manipulation and Hygiene: Standing ADL Comments: Pt initially leaning heavily to lt. while sitting EOB.  He initially required max A for balance when attempting to comb hair with Lt. UE; however, after facilitation provided at trunk and activities to improve balance, pt was able to comb hair sitting EOB with min A for balance    Mobility  Overal bed mobility: Needs Assistance Bed Mobility: Sit to Supine Supine to sit: Mod assist;HOB elevated Sit to supine: Min assist Sit to sidelying: Min assist General bed mobility comments: Assist to bring lt leg off and to elevate trunk.    Transfers  Overall transfer level: Needs assistance Equipment used: 1 person hand held assist Transfers: Sit to/from Stand Sit to Stand: Mod assist Stand pivot transfers: +2 physical assistance;Mod assist General transfer comment: Stood multiple times with pt using back of straight chair for rt hand    Ambulation / Gait / Stairs / Wheelchair Mobility  Ambulation/Gait General Gait Details: NT    Posture / Balance Dynamic Sitting Balance Sitting balance - Comments: Pt required verbal cues to  find and maintain midline at times.    Special needs/care consideration        Skin Pt's wife states he has itching  and rash on back                               Bowel mgmt: continent Bladder mgmt: condom cath currently but wife states pt. Can tell her when he has to urinate Diabetic mgmt    no     Previous Home Environment Living Arrangements: Spouse/significant other  Lives With: Spouse Available Help at Discharge: Family;Available 24 hours/day Type of Home: House Home Layout: Two level;Bed/bath upstairs;Other (Comment) (wife reports they can make arrangements for first floor bed) Alternate Level Stairs-Rails: Can reach both Alternate Level Stairs-Number of Steps: flight Home Access: Stairs to enter Entrance Stairs-Number of Steps: 6 Bathroom Shower/Tub: Tub/shower unit;Walk-in shower;None  (1/2 bath downstairs; tub/shower and walk in upstairs) Biochemist, clinical: Standard Home Care Services: No Additional Comments: wife present to confirm PLOF and home setup; pt exercised weely prior to admission  Discharge Living Setting Plans for Discharge Living Setting: Patient's home Type of Home at Discharge: House Discharge Home Layout: Two level;Other (Comment) (wife stated arrangements can be made for first floor) Discharge Home Access: Stairs to enter Entrance Stairs-Number of Steps: 6 Discharge Bathroom Shower/Tub: Tub/shower unit;Walk-in shower;None (1/2 bath first floor; tub/shower and walk in upstairs) Discharge Bathroom Toilet: Standard Does the patient have any problems obtaining your medications?: No  Social/Family/Support Systems Patient Roles: Spouse;Parent (has 3 sons) Anticipated Caregiver: wife Caregiver Availability: 24/7 Discharge Plan Discussed with Primary Caregiver: Yes Is Caregiver In Agreement with Plan?: Yes Does Caregiver/Family have Issues with Lodging/Transportation while Pt is in Rehab?: No    Goals/Additional Needs Patient/Family Goal for Rehab: supervision PT/OT; SLP supervision to min assist Expected length of stay: 13-18 days Dietary Needs: dysphagia 3 nectar thick Pt/Family Agrees to Admission and willing to participate: Yes Program Orientation Provided & Reviewed with Pt/Caregiver Including Roles  & Responsibilities: Yes   Decrease burden of Care through IP rehab admission:   no   Possible need for SNF placement upon discharge   no   Patient Condition: This patient's medical and functional status has changed since the consult dated: 08/03/13 in which the Rehabilitation Physician determined and documented that the patient's condition is appropriate for intensive rehabilitative care in an inpatient rehabilitation facility. See "History of Present Illness" (above) for medical update. Functional changes are: pt. Is mod assist for bed mobility,  transferring at +2 mod assist level for stand pivot, poor standing balance. Patient's medical and functional status update has been discussed with the Rehabilitation physician and patient remains appropriate for inpatient rehabilitation. Will admit to inpatient rehab today.  Preadmission Screen Completed By:  Tressie Stalker, 08/07/2013 4:33 PM ______________________________________________________________________   Discussed status with Dr. Naaman Plummer on 08/07/13 at 1547 and received telephone approval for admission today.  Admission Coordinator:  Gerlean Ren PT , time [id number] Sudie Grumbling 08/07/13

## 2013-08-07 NOTE — Discharge Summary (Signed)
. 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   Past Medical History  Diagnosis Date  . Hypertension   . ED (erectile dysfunction)   . CVA (cerebral infarction) 2009  . Atrial fibrillation     followed by Dr. Wynonia Lawman  . Hyperlipidemia   . Patent foramen ovale     history of  . Microscopic hematuria   . Elevated PSA     [facility], Dr. Serita Butcher;   . Cellulitis of face 11/08    [facility]   Past Surgical History  Procedure Laterality Date  . Mitral valve repair      Medications to be continued on Rehab . amLODipine  5 mg Oral Daily  . enoxaparin (LOVENOX) injection  40 mg Subcutaneous Q24H  . finasteride  5 mg Oral Daily  . irbesartan  150 mg Oral Daily   And  . hydrochlorothiazide  12.5 mg Oral Daily  . pantoprazole  40 mg Oral Daily  . senna-docusate  1 tablet Oral BID    LABORATORY STUDIES CBC    Component Value Date/Time   WBC 10.1 08/04/2013 0501   RBC 4.79 08/04/2013 0501   HGB 13.9 08/04/2013 0501   HCT 41.2 08/04/2013 0501   PLT 372 08/04/2013 0501   MCV 86.0 08/04/2013 0501   MCH 29.0 08/04/2013 0501   MCHC 33.7 08/04/2013 0501   RDW 13.7 08/04/2013 0501   LYMPHSABS 4.2* 08/02/2013 1021   MONOABS 1.0 08/02/2013 1021   EOSABS 0.2 08/02/2013 1021   BASOSABS 0.1 08/02/2013 1021   CMP    Component Value Date/Time   NA 141 08/04/2013  0501   K 4.1 08/04/2013 0501   CL 103 08/04/2013 0501   CO2 25 08/04/2013 0501   GLUCOSE 95 08/04/2013 0501   BUN 11 08/04/2013 0501   CREATININE 0.76 08/04/2013 0501   CREATININE 0.90 06/25/2011 1003   CALCIUM 8.7 08/04/2013 0501   PROT 7.6 08/02/2013 1021   ALBUMIN 4.0 08/02/2013 1021   AST 20 08/02/2013 1021   ALT 13 08/02/2013 1021   ALKPHOS 42 08/02/2013 1021   BILITOT 0.4 08/02/2013 1021   GFRNONAA 86* 08/04/2013 0501   GFRAA >90 08/04/2013 0501   COAGS Lab Results  Component Value Date   INR 1.21 08/04/2013   INR 1.23 08/03/2013   INR 1.00 08/02/2013   Lipid Panel    Component Value Date/Time   CHOL  Value: 128        ATP III CLASSIFICATION:  <200     mg/dL   Desirable  200-239  mg/dL   Borderline High  >=240    mg/dL   High 12/12/2007 0430   TRIG 94 12/12/2007 0430   HDL 21* 12/12/2007 0430   CHOLHDL 6.1 12/12/2007 0430   VLDL 19 12/12/2007 0430   LDLCALC  Value: 88  Total Cholesterol/HDL:CHD Risk Coronary Heart Disease Risk Table                     Men   Women  1/2 Average Risk   3.4   3.3 12/12/2007 0430   HgbA1C  Lab Results  Component Value Date   HGBA1C  Value: 5.5 (NOTE)   The ADA recommends the following therapeutic goals for glycemic   control related to Hgb A1C measurement:   Goal of Therapy:   < 7.0% Hgb A1C   Action Suggested:  > 8.0% Hgb A1C   Ref:  Diabetes Care, 22, Suppl. 1, 1999 12/12/2007   Cardiac Panel (last 3 results) No results found for this basename: CKTOTAL, CKMB, TROPONINI, RELINDX,  in the last 72 hours Urinalysis    Component Value Date/Time   COLORURINE YELLOW 08/02/2013 1206   APPEARANCEUR CLOUDY* 08/02/2013 1206   LABSPEC 1.014 08/02/2013 1206   PHURINE 7.5 08/02/2013 1206   GLUCOSEU NEGATIVE 08/02/2013 1206   HGBUR NEGATIVE 08/02/2013 1206   BILIRUBINUR NEGATIVE 08/02/2013 1206   KETONESUR NEGATIVE 08/02/2013 1206   PROTEINUR NEGATIVE 08/02/2013 1206   UROBILINOGEN 0.2 08/02/2013 1206   NITRITE NEGATIVE 08/02/2013 1206   LEUKOCYTESUR NEGATIVE 08/02/2013 1206    Urine Drug Screen     Component Value Date/Time   LABOPIA NONE DETECTED 08/02/2013 1206   COCAINSCRNUR NONE DETECTED 08/02/2013 1206   LABBENZ NONE DETECTED 08/02/2013 1206   AMPHETMU NONE DETECTED 08/02/2013 1206   THCU NONE DETECTED 08/02/2013 1206   LABBARB NONE DETECTED 08/02/2013 1206    Alcohol Level    Component Value Date/Time   ETH <11 08/02/2013 1021     SIGNIFICANT DIAGNOSTIC STUDIES CT of the brain  08/04/2013 Similar appearance of intracranial hemorrhage involving the posterior aspect of the right lenticular nucleus and right thalamus With urrounding mild vasogenic edema. Mild local mass effect upon the right lateral ventricle. No midline shift. No new intracranial hemorrhage noted.  08/02/2013 1. Hemorrhagic infarcts involving the posterior right basal ganglia as described. 2. Interval nonhemorrhagic infarct of the left parietal lobe does not appear acute. 3. Progression of chronic microvascular ischemic changes.  EKG atrial fibrillation, rate 106.     History of Present Illness   TARANCE BALAN is a 80 y.o. male with a history of afib, previous stroke not on anticoagulation who presented 08/02/2013 with sudden onset left sided weakness and numbness that started at 9:45 that morning. He was in his normal state prior to this. HE was drving when he noticed the left sided weakness. He denied headache but had some nausea. CT showed right thalamic hemorrhage. His BP was 181/84 on arrival in the ED. He was enrolled in the ATACH-II trial for BP control. Patient was not administerd TPA secondary to hemorrhage. He was admitted to the neuro ICU for further evaluation and treatment.  Hospital Course The patient's hemorrhage remained stable during the first 24h of admission, the time frame with highest risk of rebleeding. Hemorrhage was felt to be secondary to accelerated hypertension, presenting with SBP 182 - BP was lowered using cardene. He was enrolled in the ATACH-II study for BP control.  CT now with vasogenic edema; hemorrhage stable. Cardene was weaned and home medications were resumed. BP is improving, but may need additional medications in the future. With continued stability, He was transferred to the floor. He was on aspirin 81 mg orally every day prior to admission. Not on antithrombotics at this time secondary to bleed.  Patient with vascular risk factors of:  atrial fibrillation, not on anticoagulants prior to admission, not an anticoagulant candidate now due to hemorrhage. Repeat CT in 1-2 weeks, consider antiplatelet therapy with low dose aspirin once blood isodense. Hypertension  Hyperlipidemia (lipid panel from 2009 with LDL 88) on no statin PTA Known PFO  Hx stroke 2009   Patient with resultant left hemiparesis, dysphagia. Physical therapy, occupational therapy and speech therapy evaluated patient. All agreed inpatient rehab is needed. Patient's family is/are supportive and can provide care at discharge. CIR bed is available today and patient will be transferred there.  Discharge Exam  Blood pressure 123/62, pulse 62, temperature 97.4 F (36.3 C), temperature source Oral, resp. rate 18, height 5\' 3"  (1.6 m), weight 65.772 kg (145 lb), SpO2 97.00%.  General: The patient is alert and cooperative at the time of the examination.  Respiratory: Lung fields are clear.  Cardiovascular: Occasionally irregular rhythm, no murmurs  Abdomen: Positive bowel sounds, nontender  Skin: No significant peripheral edema is noted.  Neurologic Exam  Mental status: The patient is alert, fully cooperative.  Cranial nerves: Facial symmetry is present. Speech is dysarthric. Extraocular movements are full, visual fields are full. Decreased sensation on the left face as compared to the right to soft touch.  Motor: The patient has good strength on the right extremities. On the left, the patient has drift of the left arm to the bed. 4-/5 strength on the left arm, 4/5 strength of the left leg   Sensory examination: Decreased soft touch sensation on the left leg as compared to the right, symmetric on the arms  Coordination: The patient has good finger-nose-finger and heel-to-shin on the right arm and leg. On the left leg, the patient is able to perform toe to finger with some dysmetria. The patient has difficulty performing finger-nose-finger with the left arm.  Gait and station: The gait cannot be tested.  Reflexes: Deep tendon reflexes are symmetric.   Discharge Diet  Dysphagia 3 nectar thick liquids  Discharge Plan  Disposition:  Transfer to [place] for ongoing PT, OT and ST  Repeat CT in 1-2 weeks, consider antiplatelet therapy with low dose aspirin once blood isodense  Recommend ongoing risk factor control by Primary Care Physician at time of discharge from inpatient rehabilitation.  Follow-up TYSINGER, DAVID SHANE, PA-C in 1 month following discharge from rehab.  Follow-up with Dr. Antony Contras, Stroke Clinic in 2 months.  35 minutes were spent preparing discharge.  Signed Burnetta Sabin, AVNP, ANP-BC, [facility] Nurse Practitioner 08/07/2013, 3:00 PM  I have personally examined this patient, reviewed pertinent data and developed the plan of care. I agree with above. Antony Contras, MD

## 2013-08-07 NOTE — H&P (View-Only) (Signed)
Physical Medicine and Rehabilitation Admission H&P    Chief Complaint  Patient presents with  . Left sided weakness. And elevated BP   HPI:  Jack [name] is a 80 y.o. left-handed male with history of atrial fibrillation, hypertension, PFO, CVA 2009 not on anticoagulations and very little residual weakness. Admitted 08/02/2013 with left-sided weakness. Cranial CT scan showed 17 x 21 hemorrhagic infarcts posterior right basal ganglia and progression of microvascular disease. His his systolic blood pressure 789/38 on arrival.  Follow cranial CT scan 08/04/2013 with stable hypertensive hemorrhage that's stable and mild vasogenic edema. Patient enrolled in ATACH-II study for BP control.  Neurology recommends repeat CCT in 2-3 weeks to decide on antiplatelet therapy. MBS done yesterday and patient advanced to D3, thin liquids due to oropharyngeal dysphagia.  Patient with resultant left hemiparesis and dysphagia. Rehab team recommended CIR and patient admitted today.   Review of Systems  HENT: Positive for hearing loss.   Eyes: Negative for blurred vision and double vision.  Respiratory: Negative for cough and shortness of breath.   Cardiovascular: Negative for chest pain and palpitations.  Gastrointestinal: Negative for heartburn, nausea and constipation.  Genitourinary: Negative for dysuria and frequency.  Musculoskeletal: Negative for back pain, myalgias and neck pain.  Neurological: Positive for sensory change, speech change and focal weakness. Negative for headaches.  Psychiatric/Behavioral: The patient has insomnia.     Past Medical History  Diagnosis Date  . Hypertension   . ED (erectile dysfunction)   . CVA (cerebral infarction) 2009  . Atrial fibrillation     followed by Dr. Wynonia Lawman  . Hyperlipidemia   . Patent foramen ovale     history of  . Microscopic hematuria   . Elevated PSA     [facility], Dr. Serita Butcher;   . Cellulitis of face 11/08    [facility] ENT   Past  Surgical History  Procedure Laterality Date  . Mitral valve repair     No family history on file.  Social History:  Married. 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 He reports that he has never smoked. He has never used smokeless tobacco. He reports that he does not drink alcohol or use illicit drugs.  Allergies: No Known Allergies  Medications Prior to Admission  Medication Sig Dispense Refill  . amLODipine (NORVASC) 5 MG tablet Take 5 mg by mouth daily.        . finasteride (PROSCAR) 5 MG tablet Take 5 mg by mouth daily.      . sildenafil (VIAGRA) 50 MG tablet Take 50 mg by mouth daily as needed.        . valsartan-hydrochlorothiazide (DIOVAN-HCT) 160-12.5 MG per tablet Take 1 tablet by mouth daily.        . [DISCONTINUED] aspirin 81 MG tablet Take 81 mg by mouth daily.          Home: Home Living Family/patient expects to be discharged to:: Private residence Living Arrangements: Spouse/significant other Available Help at Discharge: Family;Available 24 hours/day Type of Home: House Home Access: Stairs to enter [employer] of Steps: 6 Home Layout: Two level;Bed/bath upstairs;Other (Comment) (wife reports they can make arrangements for first floor bed) Alternate Level Stairs-Number of Steps: flight Alternate Level Stairs-Rails: Can reach both Home Equipment: None Additional Comments: wife present to confirm PLOF and home setup; pt exercised weely prior to admission  Lives With: Spouse   Functional History: Prior Function Comments: pt driving and completely independent with ADLs.   Pt was  working 32 hours/wk at [employer]:  Mobility:     Ambulation/Gait General Gait Details: NT    ADL: ADL Eating/Feeding: Minimal assistance Where Assessed - Eating/Feeding: Bed level Grooming: Brushing hair;Maximal assistance;Minimal assistance Where Assessed - Grooming: Unsupported sitting Upper Body Bathing: Moderate  assistance Where Assessed - Upper Body Bathing: Supported sitting Lower Body Bathing: Maximal assistance Where Assessed - Lower Body Bathing: Supported sit to stand Upper Body Dressing: Maximal assistance Where Assessed - Upper Body Dressing: Supported sitting Lower Body Dressing: +1 Total assistance Where Assessed - Lower Body Dressing: Supported sit to Lobbyist: Maximal assistance Toilet Transfer Method: Sit to Loss adjuster, chartered: Bedside commode ADL Comments: Pt initially leaning heavily to lt. while sitting EOB.  He initially required max A for balance when attempting to comb hair with Lt. UE; however, after facilitation provided at trunk and activities to improve balance, pt was able to comb hair sitting EOB with min A for balance  Cognition: Cognition Overall Cognitive Status: Impaired/Different from baseline Orientation Level: Oriented X4 Cognition Arousal/Alertness: Awake/alert Behavior During Therapy: Impulsive Overall Cognitive Status: Impaired/Different from baseline Area of Impairment: Following commands;Safety/judgement;Problem solving Current Attention Level: Selective Following Commands: Follows one step commands consistently;Follows one step commands with increased time Safety/Judgement: Decreased awareness of deficits;Decreased awareness of safety Problem Solving: Difficulty sequencing;Slow processing;Requires verbal cues;Requires tactile cues General Comments: pt very impulsive with functional tasks  Physical Exam: Blood pressure 123/62, pulse 62, temperature 97.4 F (36.3 C), temperature source Oral, resp. rate 18, height 5\' 3"  (1.6 m), weight 65.772 kg (145 lb), SpO2 97.00%. Physical Exam  Vitals reviewed.  Constitutional: He is oriented to person, place, and time. No distress HEENT: oral mucosa moist. Missing a few teeth Eyes:  Pupils reactive to light Neck: Normal range of motion. Neck supple. No thyromegaly present.   Cardiovascular:  Rate controlled, irregular rhythm, small sem Respiratory: Effort normal and breath sounds normal. No respiratory distress. No wheezes or rales GI: Soft. Bowel sounds are normal. He exhibits no distension.  Neurological: He is alert and oriented to person, place, and time.  Flat affect. Follows commands. Speech is mildly dysarthric but intelligible. Improving oromotor control. A little slow, but better response time toda. LUE is grossly 2 to 2+ but inconsistent, may have some apraxia.  LLE is better at 3/5 HF, KE and 4- ADF/APF.  Senses pain in all 4 but more responsive on the right.. Limited insight and awareness.  Skin: Skin is warm and dry. Right antecubital IV site appears inflamed.   No results found for this or any previous visit (from the past 48 hour(s)).   Post Admission Physician Evaluation: 1. Functional deficits secondary  to right basal ganglia infarcts. 2. Patient is admitted to receive collaborative, interdisciplinary care between the physiatrist, rehab nursing staff, and therapy team. 3. Patient's level of medical complexity and substantial therapy needs in context of that medical necessity cannot be provided at a lesser intensity of care such as a SNF. 4. Patient has experienced substantial functional loss from his/her baseline which was documented above under the "Functional History" and "Functional Status" headings.  Judging by the patient's diagnosis, physical exam, and functional history, the patient has potential for functional progress which will result in measurable gains while on inpatient rehab.  These gains will be of substantial and practical use upon discharge  in facilitating mobility and self-care at the household level. 5. Physiatrist will provide 24 hour management of medical needs as well  as oversight of the therapy plan/treatment and provide guidance as appropriate regarding the interaction of the two. 6. 24 hour rehab nursing will assist with  bladder management, bowel management, safety, skin/wound care, disease management, medication administration, pain management and patient education  and help integrate therapy concepts, techniques,education, etc. 7. PT will assess and treat for/with: Lower extremity strength, range of motion, stamina, balance, functional mobility, safety, adaptive techniques and equipment, NMR, education, visual and cognitive perceptual rx.   Goals are: supervision to min assist. 8. OT will assess and treat for/with: ADL's, functional mobility, safety, upper extremity strength, adaptive techniques and equipment, NMR, cognitive perceptual and visual perceptual awareness.   Goals are: supervision to min assist. 9. SLP will assess and treat for/with: speech, cognition.  Goals are: supervision. 10. Case Management and Social Worker will assess and treat for psychological issues and discharge planning. 11. Team conference will be held weekly to assess progress toward goals and to determine barriers to discharge. 12. Patient will receive at least 3 hours of therapy per day at least 5 days per week. 13. ELOS: 14-17 days       14. Prognosis:  excellent   Medical Problem List and Plan: Hemorrhagic right basal ganglia infarcts  1. DVT Prophylaxis/Anticoagulation: Pharmaceutical: Lovenox 2. Pain Management: N/A 3. Mood:  No signs of distress noted. LCSW to follow for evaluation.  4. Neuropsych: This patient is capable of making decisions on his own behalf. 5. HTN: Monitor with qid checks. Continue HCTZ, Avapro and Norvasc.  6. BPH: continue proscar.    Meredith Staggers, MD, [place] [facility]   08/07/2013

## 2013-08-07 NOTE — Progress Notes (Signed)
Pt arrived on unit at 1820 from 3W23. Discussed rehab schedule, visiting hours, and possible length of stay. On coming nurse to continue with orientation process.

## 2013-08-07 NOTE — Progress Notes (Signed)
Speech Language Pathology Treatment: Dysphagia  Patient Details Name: Jack [name] MRN: [id number] DOB: [birth year] Today's Date: 08/07/2013 Time: 1150-1200 SLP Time Calculation (min): 10 min  Assessment / Plan / Recommendation Clinical Impression  F/u after yesterday's MBS.  Wife present.  Pt with improving toleration of POs - observed with mechanical solids and thin liquids with excellent toleration, no coughing nor overt s/s of aspiration.  Lungs remain clear; pt afebrile.  Pt with mild difficulty discussing results/recs from study.  Reports no coughing last two meals.  Recommend continuing current diet.  SLP to follow briefly, upgrade at bedside when ready.     HPI HPI: 80 y.o. male with a history of afib, previous stroke not on anticoagulation who presents with sudden onset left sided weakness and numbness that started at 9:45 this morning. He was in his normal state prior to this. HE was drving when he noticed the left sided weakness.  PMH:  HTN, CVA, history of patent foramen ovale, cellulitis of face.  CT 1/22 Hemorrhagic infarcts involving the posterior right basal ganglia as described, Interval nonhemorrhagic infarct of the left parietal lobe does not appear acute, progression of chronic microvascular ischemic changes.      SLP Plan  Continue with current plan of care    Recommendations Diet recommendations: Dysphagia 3 (mechanical soft);Thin liquid Liquids provided via: Cup;Straw Medication Administration: Whole meds with puree Supervision: Patient able to self feed;Intermittent supervision to cue for compensatory strategies Compensations: Slow rate;Small sips/bites Postural Changes and/or Swallow Maneuvers: Seated upright 90 degrees              Oral Care Recommendations: Oral care BID Follow up Recommendations: Inpatient Rehab Plan: Continue with current plan of care    Jack [name] [name], [place] CCC/SLP Pager [phone]      Jack [name] 08/07/2013, 12:45  PM

## 2013-08-07 NOTE — Discharge Instructions (Signed)
Hemorrhagic Stroke  A hemorrhagic stroke occurs when a blood vessel in the brain leaks or bursts. Areas of the brain that should receive blood, oxygen, and nutrients from the damaged blood vessel are deprived of blood flow. This causes areas of the brain to become damaged. Damage also occurs to areas of the brain where the leaked blood accumulates and presses on normal tissue. This is a medical emergency. This can cause permanent damage and loss of brain function.  CAUSES   A hemorrhagic stroke is caused by a decrease of oxygen supply to an area of your brain. It is the result of a blood vessel that leaks or ruptures. The leaking or rupturing blood vessel occurs due to one of the following conditions:  · A ballooning of a weak section in a blood vessel (aneurysm).  · Hardened, thin blood vessels. Blood vessel walls lined with plaque becoming thin and hardened. These hardened, thin artery walls can crack open and allow blood to flow out of the blood vessel.  · An abnormal formation of a blood vessel (arteriovenous malformation). This condition results in an abnormal tangle of thin-walled blood vessels.  Once the blood vessel ruptures, bleeding occurs. The blood from the ruptured blood vessel accumulates and compresses the surrounding brain tissue. Hemorrhagic strokes are classified as to the location of the bleed. If the bleeding occurs within the brain tissue, the condition is called an intracerebral hemorrhage. If the bleeding occurs in the area between the brain and the thin tissues that cover the brain, the condition is called a subarachnoid hemorrhage.   RISK FACTORS  · High blood pressure (hypertension).  · Abnormal blood vessels present since birth.  · Bleeding disorders, such as hemophilia, sickle cell disease, or liver disease.  · The blood becoming too thin while taking blood thinners (anticoagulants).  · Smoking.  · Excessive alcohol use.  · Use of illegal drugs (especially cocaine and  methamphetamine).  SYMPTOMS   · Sudden, severe headache with no known cause. The headache is often described as the worst headache ever experienced.  · Nausea or vomiting, especially when combined with other symptoms such as a headache.  · Sudden weakness or numbness of the face, arm, or leg, especially on one side of the body.  · Sudden trouble walking or difficulty moving arms or legs.  · Sudden confusion.  · Sudden personality changes.  · Trouble speaking (aphasia) or understanding.  · Difficulty swallowing.  · Sudden trouble seeing in one or both eyes.  · Double vision.  · Dizziness.  · Loss of balance or coordination.  · Intolerance to light.  · Stiff neck.  DIAGNOSIS   Your caregiver will often suspect a hemorrhagic stroke based on your symptoms, history, and exam. A CT scan of the brain is usually performed. This is done to confirm the presence of bleeding in the brain, to look for causes, and to determine severity. Other tests may be done, including:  · An MRI.  · Angiography.  · Blood tests.  TREATMENT   The goals of treatment are to try to stop the bleeding, control pressure in the brain, and relieve symptoms.  · Medicines may be given to:  · Lower blood pressure (antihypertensives).  · Relieve pain (analgesics).  · Relieve nausea or vomiting.  · Stop or prevent seizures.  · Prevent the blood vessels in the brain from going into spasm in response to the presence of bleeding.  · Other medicines, blood products, or vitamin K may   also be given to control the bleeding.  · If there is a collection of blood putting pressure on your brain, or if the blood vessel continues to bleed, surgery may be required.  · Surgery may also be needed if tests reveal that there are other problems within the blood vessels of the brain that put you at an elevated risk for another bleeding event in the future.  Further treatment depends on the duration, severity, and cause of your symptoms. Physical, speech, and occupational  therapists will assess you and work to improve any functions impaired by the stroke. Measures will be taken to prevent short-term and long-term complications, including infection from breathing foreign material into the lungs (aspiration pneumonia), blood clots in the legs, bedsores, and falls.  HOME CARE INSTRUCTIONS   · Be sure to take all your medicines exactly as instructed. Do not take any over-the-counter drugs or supplements without talking to your caregiver.  · If swallow studies have determined that your swallowing reflex is present, you should eat healthy foods. A diet that includes 5 or more servings of fruits and vegetables each day may reduce the risk of stroke. Foods may need to be a special consistency (soft or pureed), or small bites may need to be taken in order to avoid aspirating or choking. Certain diets may be prescribed to address high blood pressure, high cholesterol, diabetes, or obesity.  · A low salt (sodium), low saturated fat, low trans fat, low cholesterol diet is recommended to manage high blood pressure.  · A low saturated fat, low trans fat, low cholesterol, and high fiber diet may control cholesterol levels.  · A controlled carbohydrate, controlled sugar diet is recommended to manage diabetes.  · A reduced calorie, low sodium, low saturated fat, low trans fat, low cholesterol diet is recommended to manage obesity.  · Maintain a healthy weight.  · Stay physically active. It is recommended that you get at least 30 minutes of activity on most or all days.  · Do not smoke.  · Limit alcohol use. Moderate alcohol use is considered to be:  · No more than 2 drinks each day for men.  · No more than 1 drink each day for nonpregnant women.  · Stop drug abuse.  · A safe home environment is important to reduce the risk of falls. Your caregiver may arrange for specialists to evaluate your home. Having grab bars in the bedroom and bathroom is often important. Your caregiver may arrange for special  equipment to be used at home, such as raised toilets and a seat for the shower.  · Physical, occupational, and speech therapy. Ongoing therapy may be needed to maximize your recovery after a stroke. If you have been advised to use a walker or a cane, use it at all times. Be sure to keep your therapy appointments.  · Follow all instructions for follow-up with your caregiver. This is very important. This includes any referrals, physical therapy, rehabilitation, and laboratory tests. Proper follow up can prevent another stroke from occurring.  SEEK MEDICAL CARE IF:  · You have personality changes.  · You have difficulty swallowing.  · You are seeing double.  · You have dizziness.  · You have a fever.  · You have skin breakdown.  SEEK IMMEDIATE MEDICAL CARE IF:   · You have a sudden, severe headache with no known cause.  · You have sudden nausea or vomiting with a severe headache.  · You have sudden weakness or numbness   of the face, arm, or leg, especially on one side of the body.  · You have sudden trouble walking or difficulty moving arms or legs.  · You have sudden confusion.  · You have trouble speaking (aphasia) or understanding.  · You have sudden trouble seeing in one or both eyes.  · You have a sudden loss of balance or coordination.  · You have a stiff neck.  · You have difficulty breathing.  · You have a partial or total loss of consciousness.  Any of these symptoms may represent a serious problem that is an emergency. Do not wait to see if the symptoms will go away. Get medical help at once. Call your local emergency services (911 in [place] Do not drive yourself to the hospital.  Document Released: 12/16/2009 Document Revised: 02/28/2013 Document Reviewed: 02/06/2012  [employer]® Patient Information ©2014 [employer].

## 2013-08-07 NOTE — Progress Notes (Signed)
Physical Therapy Treatment Patient Details Name: Jack [name] MRN: [id number] DOB: 05-14-1934 Today's Date: 08/07/2013 Time: 6734-1937 PT Time Calculation (min): 24 min  PT Assessment / Plan / Recommendation  History of Present Illness 80 y.o. male with a history of afib, previous stroke not on anticoagulation who presents with sudden onset left sided weakness and numbness that started at 9:45 this morning. He was in his normal state prior to this. HE was drving when he noticed the left sided weakness.  PMH:  HTN, CVA, history of patent foramen ovale, cellulitis of face.  CT 1/22 Hemorrhagic infarcts involving the posterior right basal ganglia as described, Interval nonhemorrhagic infarct of the left parietal lobe does not appear acute, progression of chronic microvascular ischemic changes.    PT Comments   Pt continues to make good progress. Continue to recommend comprehensive inpatient rehab (CIR) for post-acute therapy needs.   Follow Up Recommendations  CIR     Does the patient have the potential to tolerate intense rehabilitation     Barriers to Discharge        Equipment Recommendations  Other (comment) (TBD)    Recommendations for Other Services    Frequency Min 4X/week   Progress towards PT Goals Progress towards PT goals: Progressing toward goals  Plan Current plan remains appropriate    Precautions / Restrictions Precautions Precautions: Fall   Pertinent Vitals/Pain No c/o's    Mobility  Bed Mobility Supine to sit: Mod assist;HOB elevated Sit to supine: Min assist General bed mobility comments: Assist to bring lt leg off and to elevate trunk. Transfers Equipment used: 1 person hand held assist Transfers: Sit to/from Stand Sit to Stand: Mod assist General transfer comment: Stood multiple times with pt using back of straight chair for rt hand Modified Rankin (Stroke Patients Only) Pre-Morbid Rankin Score: No symptoms Modified Rankin: Severe disability     Exercises     PT Diagnosis:    PT Problem List:   PT Treatment Interventions:     PT Goals (current goals can now be found in the care plan section)    Visit Information  Last PT Received On: 08/07/13 Assistance Needed: +1 History of Present Illness: 80 y.o. male with a history of afib, previous stroke not on anticoagulation who presents with sudden onset left sided weakness and numbness that started at 9:45 this morning. He was in his normal state prior to this. HE was drving when he noticed the left sided weakness.  PMH:  HTN, CVA, history of patent foramen ovale, cellulitis of face.  CT 1/22 Hemorrhagic infarcts involving the posterior right basal ganglia as described, Interval nonhemorrhagic infarct of the left parietal lobe does not appear acute, progression of chronic microvascular ischemic changes.     Subjective Data      Cognition  Cognition Arousal/Alertness: Awake/alert Current Attention Level: Selective Following Commands: Follows one step commands consistently Safety/Judgement: Decreased awareness of deficits;Decreased awareness of safety Problem Solving: Requires verbal cues;Requires tactile cues    Balance  Balance Sitting-balance support: Bilateral upper extremity supported Sitting balance-Leahy Scale: Fair Sitting balance - Comments: Pt required verbal cues to  find and maintain midline at times. Postural control: Left lateral lean Standing balance support: Single extremity supported Standing balance-Leahy Scale: Poor Standing balance comment: Pt stood x 5 for 2-4 minutes each time. Facilitation of lt knee to maintain extension without hyperextension.  Verbal/tactile cues for hip, trunk, and knee extension on lt.  Worked on stepping forward and back with rt leg while  left knee blocked and facilitation of lt hip extension.  End of Session PT - End of Session Equipment Utilized During Treatment: Gait belt Activity Tolerance: Patient tolerated treatment  well Patient left: in bed;with call bell/phone within reach;with family/visitor present   GP     [facility] 08/07/2013, 4:02 PM  [facility] PT [phone]

## 2013-08-07 NOTE — Progress Notes (Signed)
IP Rehab--I have insurance approval and will be admitting Mr. Pienta to IP rehab today.  Call if questions.  Gerlean Ren PT [phone]

## 2013-08-08 ENCOUNTER — Inpatient Hospital Stay (HOSPITAL_COMMUNITY): Payer: Medicare Other

## 2013-08-08 ENCOUNTER — Inpatient Hospital Stay (HOSPITAL_COMMUNITY): Payer: Medicare Other | Admitting: Occupational Therapy

## 2013-08-08 DIAGNOSIS — I10 Essential (primary) hypertension: Secondary | ICD-10-CM

## 2013-08-08 DIAGNOSIS — I619 Nontraumatic intracerebral hemorrhage, unspecified: Secondary | ICD-10-CM

## 2013-08-08 DIAGNOSIS — Z8673 Personal history of transient ischemic attack (TIA), and cerebral infarction without residual deficits: Secondary | ICD-10-CM

## 2013-08-08 DIAGNOSIS — I4891 Unspecified atrial fibrillation: Secondary | ICD-10-CM

## 2013-08-08 LAB — COMPREHENSIVE METABOLIC PANEL
ALT: 12 U/L (ref 0–53)
AST: 17 U/L (ref 0–37)
Albumin: 3.6 g/dL (ref 3.5–5.2)
Alkaline Phosphatase: 42 U/L (ref 39–117)
BUN: 14 mg/dL (ref 6–23)
CHLORIDE: 103 meq/L (ref 96–112)
CO2: 25 mEq/L (ref 19–32)
Calcium: 8.8 mg/dL (ref 8.4–10.5)
Creatinine, Ser: 0.87 mg/dL (ref 0.50–1.35)
GFR calc Af Amer: >90 mL/min (ref >90)
GFR, EST NON AFRICAN AMERICAN: 81 mL/min — AB (ref >90)
Glucose, Bld: 106 mg/dL — ABNORMAL HIGH (ref 70–99)
Potassium: 3.4 mEq/L — ABNORMAL LOW (ref 3.7–5.3)
Sodium: 141 mEq/L (ref 137–147)
Total Bilirubin: 0.8 mg/dL (ref 0.3–1.2)
Total Protein: 6.9 g/dL (ref 6.0–8.3)

## 2013-08-08 LAB — CBC WITH DIFFERENTIAL/PLATELET
BASOS ABS: 0 10*3/uL (ref 0.0–0.1)
Basophils Relative: 0 % (ref 0–1)
Eosinophils Absolute: 0.3 10*3/uL (ref 0.0–0.7)
Eosinophils Relative: 3 % (ref 0–5)
HEMATOCRIT: 43.4 % (ref 39.0–52.0)
HEMOGLOBIN: 15.2 g/dL (ref 13.0–17.0)
LYMPHS ABS: 1.2 10*3/uL (ref 0.7–4.0)
Lymphocytes Relative: 14 % (ref 12–46)
MCH: 30.5 pg (ref 26.0–34.0)
MCHC: 35 g/dL (ref 30.0–36.0)
MCV: 87 fL (ref 78.0–100.0)
MONO ABS: 0.7 10*3/uL (ref 0.1–1.0)
MONOS PCT: 8 % (ref 3–12)
NEUTROS ABS: 6.6 10*3/uL (ref 1.7–7.7)
Neutrophils Relative %: 74 % (ref 43–77)
Platelets: 334 10*3/uL (ref 150–400)
RBC: 4.99 MIL/uL (ref 4.22–5.81)
RDW: 13.7 % (ref 11.5–15.5)
WBC: 8.8 10*3/uL (ref 4.0–10.5)

## 2013-08-08 NOTE — Progress Notes (Signed)
Social Work Assessment and Plan Social Work Assessment and Plan  Patient Details  Name: Jack [name] MRN: [id number] Date of Birth: 10/29/1933  Today's Date: 08/08/2013  Problem List:  Patient Active Problem List   Diagnosis Date Noted  . Bleeding in brain due to blood pressure disorder 08/07/2013  . ICH (intracerebral hemorrhage) 08/02/2013  . Acute hemorrhagic infarction of brain 08/02/2013  . Hx of mitral valve repair   . History of stroke   . Sleep apnea 07/02/2011  . History of atrial fibrillation   . Cyst and pseudocyst of pancreas 09/13/2007  . Hypertension   . Premature ventricular contraction   . Hiatal hernia   . History of renal cyst   . BPH (benign prostatic hypertrophy)    Past Medical History:  Past Medical History  Diagnosis Date  . Hypertension   . ED (erectile dysfunction)   . CVA (cerebral infarction) 2009  . Atrial fibrillation     followed by Jack [name]  . Hyperlipidemia   . Patent foramen ovale     history of  . Microscopic hematuria   . Elevated PSA     [facility], Dr. Serita Butcher;   . Cellulitis of face 11/08    [facility]   Past Surgical History:  Past Surgical History  Procedure Laterality Date  . Mitral valve repair     Social History:  reports that he has never smoked. He has never used smokeless tobacco. He reports that he does not drink alcohol or use illicit drugs.  Family / Support Systems Marital Status: Married Patient Roles: Spouse;Parent;Other (Comment) Scientist, clinical (histocompatibility and immunogenetics)) Spouse/Significant Other: Jack [name]  [phone] Children: One local son two others out of town Other Supports: Friends and [employer] Anticipated Caregiver: Wife Ability/Limitations of Caregiver: Wife is able to provide assist if needed. Caregiver Availability: 24/7 Family Dynamics: Close knit family who will pull together when crisis.  Pt has always been independent and wants to remain so.  He has recovered from CABG and CVA in the past and  hopes he will again.  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  Wife is involved also.   Abuse/Neglect Physical Abuse: Denies Verbal Abuse: Denies Sexual Abuse: Denies Exploitation of patient/patient's resources: Denies Self-Neglect: Denies  Emotional Status Pt's affect, behavior adn adjustment status: Pt is motivated to improve and recover form this stroke.  He has a good attitude and is working on his arm when not in therapies.  He recovered once before and is hopeful he will again.  This one is worse than before and he is older. Recent Psychosocial Issues: Other health issues Pyschiatric History: No history-deferrd depression screen due to pt's hearing aids need new batteries, wife to get them today.  Will re-assess later and monitor while here.  Wife reports : " He has always done well and relies upon his faith." Substance Abuse History: No issues  Patient / Family Perceptions, Expectations & Goals Pt/Family understanding of illness & functional limitations: Pt and wife have  a good udnerstanding of his stroke and deficits.  Both have spoken with MD and feel their questions were answered.  Wife plans to be here and observe in therapies and be involved in pt's care.  Will see how first day goes. Premorbid pt/family roles/activities: Husband, Father, grandfather, Employee, Church member, Health visitor, etc Anticipated changes in roles/activities/participation: He would like to resume these roles at discharge. Pt/family expectations/goals: Pt states; ' I want to be able to take care of myself, like I always have."  Wifef  states: " I hope he can get his movement in his leg back this will make it easier to move."  [employer]: None Premorbid Home Care/DME Agencies: Other (Comment) (Inpast-cardiac Rehab) Transportation available at discharge: Wife Resource referrals recommended: Support group (specify) (CVA Support group)  Discharge Planning Living Arrangements: Spouse/significant other Support Systems: Spouse/significant other;Children;Other relatives;Friends/neighbors;Church/faith community Type of Residence: Private residence Insurance Resources: Multimedia programmer (specify) (AARP Medicare) Financial Resources: Employment;Social Security Financial Screen Referred: No Living Expenses: Lives with family Money Management: Spouse;Patient Does the patient have any problems obtaining your medications?: No Home Management: Wife Patient/Family Preliminary Plans: Return home with wife providing assistance.  Both are hopeful he will do well here and not need much assist at discharge from here.  Team to evaluate today and set goals.  Wife here to observe and will be involved in his rehab.  Local son works so pt's wife will be the primary caregiver. Social Work Anticipated Follow Up Needs: HH/OP;Support Group  Clinical Impression Very pleasant couple who are willing to do for one another.  Pt is motivated and has high expectations of himself to do well here.  Wife is willing to assist and will be here daily. Will await team's evaluations and come up with a safe discharge plan.  Jack [name] 08/08/2013, 9:33 AM

## 2013-08-08 NOTE — Progress Notes (Signed)
Subjective/Complaints: 80 y.o. left-handed male with history of atrial fibrillation, hypertension, PFO, CVA 2009 not on anticoagulations and very little residual weakness. Admitted 08/02/2013 with left-sided weakness. Cranial CT scan showed 17 x 21 hemorrhagic infarcts posterior right basal ganglia and progression of microvascular disease. His his systolic blood pressure 993/71 on arrival. Follow cranial CT scan 08/04/2013 with stable hypertensive hemorrhage that's stable and mild vasogenic edema. Patient enrolled in ATACH-II study for BP control. Neurology recommends repeat CCT in 2-3 weeks to decide on antiplatelet therapy  No issues overnite except poor sleep Review of Systems - Negative except Left side weak  Objective: Vital Signs: Blood pressure 148/88, pulse 74, temperature 97.6 F (36.4 C), temperature source Oral, resp. rate 18, height 5\' 3"  (1.6 m), weight 63.8 kg (140 lb 10.5 oz), SpO2 94.00%. Dg Swallowing Func-speech Pathology  08/06/2013   [name], CCC-SLP     08/06/2013  2:12 PM Objective Swallowing Evaluation: Modified Barium Swallowing Study   Patient Details  Name: Jack [name] MRN: [id number] Date of Birth: Jan 03, 1934  Today's Date: 08/06/2013 Time: 1300-1330 SLP Time Calculation (min): 30 min  Past Medical History:  Past Medical History  Diagnosis Date  . Hypertension   . ED (erectile dysfunction)   . CVA (cerebral infarction) 2009  . Atrial fibrillation     followed by Dr. Wynonia Lawman  . Hyperlipidemia   . Patent foramen ovale     history of  . Microscopic hematuria   . Elevated PSA     [facility], Dr. Serita Butcher;   . Cellulitis of face 11/08    [facility]   Past Surgical History:  Past Surgical History  Procedure Laterality Date  . Mitral valve repair     HPI:  81 y.o. male with a history of afib, previous stroke not on  anticoagulation who presents with sudden onset left sided  weakness and numbness that started at 9:45 this morning. He was  in his normal state prior to  this. HE was drving when he noticed  the left sided weakness.  PMH:  HTN, CVA, history of patent  foramen ovale, cellulitis of face.  CT 1/22 Hemorrhagic infarcts  involving the posterior right basal ganglia as described,  Interval nonhemorrhagic infarct of the left parietal lobe does  not appear acute, progression of chronic microvascular ischemic  changes.     Assessment / Plan / Recommendation Clinical Impression  Dysphagia Diagnosis: Mild oral phase dysphagia Clinical impression: Patient exhibits a mild oropharyngeal  dysphagia characterized by reduced tongue strength for chewing  and manipulating solid foods, intermittent reduced laryngeal  elevation with only one time penetration of thin liquids, which  cleared spontaneously.  Strict aspiration precautions should be  continued, as there was no cough during this study, but pt did  cough with thin liquids prior to coming down for this study.  Pt.  may be intermittently aspirating with thin, therefore, close  monitoring is recommended.    Treatment Recommendation  Therapy as outlined in treatment plan below    Diet Recommendation Dysphagia 3 (Mechanical Soft);Thin liquid (as  tolerated)   Liquid Administration via: Cup;No straw Medication Administration: Whole meds with puree Supervision: Patient able to self feed;Full supervision/cueing  for compensatory strategies Compensations: Slow rate;Small sips/bites Postural Changes and/or Swallow Maneuvers: Seated upright 90  degrees    Other  Recommendations Oral Care Recommendations: Oral care BID Other Recommendations: Clarify dietary restrictions   Follow Up Recommendations  Inpatient Rehab    Frequency and Duration min 2x/week  2 weeks   Pertinent Vitals/Pain No cough with flash penetration; No  coughing throughout MBS, but did cough with thins at bedside.    SLP Swallow Goals     General HPI: 80 y.o. male with a history of afib, previous stroke  not on anticoagulation who presents with sudden onset left sided   weakness and numbness that started at 9:45 this morning. He was  in his normal state prior to this. HE was drving when he noticed  the left sided weakness.  PMH:  HTN, CVA, history of patent  foramen ovale, cellulitis of face.  CT 1/22 Hemorrhagic infarcts  involving the posterior right basal ganglia as described,  Interval nonhemorrhagic infarct of the left parietal lobe does  not appear acute, progression of chronic microvascular ischemic  changes. Type of Study: Modified Barium Swallowing Study Reason for Referral: Objectively evaluate swallowing function Previous Swallow Assessment: BSE 08/03/13 Dys 3 with Nectar thick  liquids; MBS in 2009 Diet Prior to this Study: Dysphagia 3 (soft);Nectar-thick liquids Temperature Spikes Noted: No Respiratory Status: Room air History of Recent Intubation: No Behavior/Cognition: Alert;Cooperative;Pleasant mood Oral Cavity - Dentition: Adequate natural dentition Oral Motor / Sensory Function: Within functional limits Self-Feeding Abilities: Able to feed self Patient Positioning: Upright in chair Baseline Vocal Quality: Clear Volitional Cough: Strong Volitional Swallow: Able to elicit Anatomy: Within functional limits Pharyngeal Secretions: Not observed secondary MBS    Reason for Referral Objectively evaluate swallowing function   Oral Phase Oral Preparation/Oral Phase Oral Phase: WFL   Pharyngeal Phase Pharyngeal Phase Pharyngeal Phase: Impaired Pharyngeal - Thin Pharyngeal - Thin Cup: Reduced laryngeal  elevation;Penetration/Aspiration during swallow Penetration/Aspiration details (thin cup): Material enters  airway, remains ABOVE vocal cords then ejected out  Cervical Esophageal Phase    GO    Cervical Esophageal Phase Cervical Esophageal Phase: [facility]    Functional Limitations: Swallowing    [name] 08/06/2013, 2:11 PM    No results found for this or any previous visit (from the past 72 hour(s)).    Vitals reviewed.  Constitutional: He is oriented to person, place,  and time. No distress  HEENT: oral mucosa moist. Missing a few teeth  Eyes:  Pupils reactive to light  Neck: Normal range of motion. Neck supple. No thyromegaly present.  Cardiovascular:  Rate controlled, irregular rhythm, small sem  Respiratory: Effort normal and breath sounds normal. No respiratory distress. No wheezes or rales  GI: Soft. Bowel sounds are normal. He exhibits no distension.  Neurological: He is alert and oriented to person, place, and time.  Flat affect. Follows commands. Speech is mildly dysarthric but intelligible. Improving oromotor control. A little slow, but better response time toda. LUE is grossly 2 to 2+ but inconsistent, may have some apraxia. LLE is better at 3/5 HF, KE and 4- ADF/APF. Senses pain in all 4 but more responsive on the right.. Limited insight and awareness.  Skin: Skin is warm and dry. Right antecubital IV site appears inflamed.   Assessment/Plan: 1. Functional deficits secondary to Right basal ganglia hemmorrhagic infarct which require 3+ hours per day of interdisciplinary therapy in a comprehensive inpatient rehab setting. Physiatrist is providing close team supervision and 24 hour management of active medical problems listed below. Physiatrist and rehab team continue to assess barriers to discharge/monitor patient progress toward functional and medical goals. FIM:                   Comprehension Comprehension Mode: Auditory Comprehension: 5-Follows basic conversation/direction: With extra  time/assistive device  Expression Expression Mode: Verbal Expression: 5-Expresses basic needs/ideas: With extra time/assistive device     Problem Solving Problem Solving: 4-Solves basic 75 - 89% of the time/requires cueing 10 - 24% of the time  Memory Memory: 4-Recognizes or recalls 75 - 89% of the time/requires cueing 10 - 24% of the time   Medical Problem List and Plan:  Hemorrhagic right basal ganglia infarcts  1. DVT  Prophylaxis/Anticoagulation: Pharmaceutical: Lovenox  2. Pain Management: N/A  3. Mood: No signs of distress noted. LCSW to follow for evaluation.  4. Neuropsych: This patient is capable of making decisions on his own behalf.  5. HTN: Monitor with qid checks. Continue HCTZ, Avapro and Norvasc.  6. BPH: continue proscar.   LOS (Days) 1 A FACE TO FACE EVALUATION WAS PERFORMED  KIRSTEINS,ANDREW E 08/08/2013, 9:06 AM

## 2013-08-08 NOTE — Evaluation (Signed)
Occupational Therapy Assessment and Plan  Patient Details  Name: Jack [name] MRN: [id number] Date of Birth: 03/25/1934  OT Diagnosis: apraxia, hemiplegia affecting dominant side and sensory and perceptual deficits Rehab Potential: Rehab Potential: Good ELOS: 14- 18 days   Today's Date: 08/08/2013 Time: 1005-1105 Time Calculation (min): 60 min  Problem List:  Patient Active Problem List   Diagnosis Date Noted  . Bleeding in brain due to blood pressure disorder 08/07/2013  . ICH (intracerebral hemorrhage) 08/02/2013  . Acute hemorrhagic infarction of brain 08/02/2013  . Hx of mitral valve repair   . History of stroke   . Sleep apnea 07/02/2011  . History of atrial fibrillation   . Cyst and pseudocyst of pancreas 09/13/2007  . Hypertension   . Premature ventricular contraction   . Hiatal hernia   . History of renal cyst   . BPH (benign prostatic hypertrophy)     Past Medical History:  Past Medical History  Diagnosis Date  . Hypertension   . ED (erectile dysfunction)   . CVA (cerebral infarction) 2009  . Atrial fibrillation     followed by Dr. Wynonia Lawman  . Hyperlipidemia   . Patent foramen ovale     history of  . Microscopic hematuria   . Elevated PSA     [facility], Dr. Serita Butcher;   . Cellulitis of face 11/08    [facility]   Past Surgical History:  Past Surgical History  Procedure Laterality Date  . Mitral valve repair      Assessment & Plan Clinical Impression: Jack [name] is a 80 y.o. left-handed male with history of atrial fibrillation, hypertension, PFO, CVA 2009 not on anticoagulations and very little residual weakness. Admitted 08/02/2013 with left-sided weakness. Cranial CT scan showed 17 x 21 hemorrhagic infarcts posterior right basal ganglia and progression of microvascular disease. His his systolic blood pressure 712/19 on arrival.  Follow cranial CT scan 08/04/2013 with stable hypertensive hemorrhage that's stable and mild vasogenic edema.  Patient enrolled in ATACH-II study for BP control.  Neurology recommends repeat CCT in 2-3 weeks to decide on antiplatelet therapy. MBS done yesterday and patient advanced to D3, thin liquids due to oropharyngeal dysphagia.  Patient with resultant left hemiparesis and dysphagia. Rehab team recommended CIR and patient admitted today   Patient transferred to CIR on 08/07/2013 .    Patient currently requires total with basic self-care skills secondary to decreased cardiorespiratoy endurance, unbalanced muscle activation and decreased motor planning, decreased midline orientation and decreased attention to left and decreased sitting balance, decreased standing balance and decreased postural control.  Prior to hospitalization, patient was fully independent and driving.  Patient will benefit from skilled intervention to increase independence with basic self-care skills prior to discharge home with care partner.  Anticipate patient will require 24 hour supervision and follow up home health.  OT - End of Session Endurance Deficit: Yes Endurance Deficit Description: fatigued after standing x 30 seconds OT Assessment Rehab Potential: Good OT Patient demonstrates impairments in the following area(s): Balance;Endurance;Motor;Perception;Sensory;Safety OT Basic ADL's Functional Problem(s): Eating;Grooming;Bathing;Dressing;Toileting OT Transfers Functional Problem(s): Toilet;Tub/Shower OT Additional Impairment(s): Fuctional Use of Upper Extremity OT Plan OT Intensity: Minimum of 1-2 x/day, 45 to 90 minutes OT Frequency: 5 out of 7 days OT Duration/Estimated Length of Stay: 14- 18 days OT Treatment/Interventions: Balance/vestibular training;Discharge planning;DME/adaptive equipment instruction;Functional mobility training;Neuromuscular re-education;Patient/family education;Self Care/advanced ADL retraining;Therapeutic Activities;UE/LE Strength taining/ROM;Visual/perceptual remediation/compensation;UE/LE  Coordination activities;Therapeutic Exercise OT Self Feeding Anticipated Outcome(s): set up OT Basic Self-Care Anticipated Outcome(s): supervision  OT Toileting Anticipated Outcome(s): supervision OT Bathroom Transfers Anticipated Outcome(s): supervision OT Recommendation Patient destination: Home Follow Up Recommendations: Home health OT Equipment Recommended: 3 in 1 bedside comode;Tub/shower seat   Skilled Therapeutic Intervention Pt seen this session for initial evaluation, explanation of role of OT, goals, and ADL retraining with a focus on sit to stand, standing balance, attention to L side and use of L hand. Pt has absent sensation in LUE/LLE which makes his movement very challenging for him.  Pt did participate well and his wife assisted today with his standing. Pt is requiring 2 person A as he leans to the left. Pt resting in w/c at end of session with wife in room with him.  OT Evaluation Precautions/Restrictions  Precautions Precautions: Fall Precaution Comments: Severely impaired LUE/LLE sensation   Pain Pain Assessment Pain Assessment: No/denies pain Home Living/Prior Functioning Home Living Family/patient expects to be discharged to:: Private residence Living Arrangements: Spouse/significant other Available Help at Discharge: Family;Available 24 hours/day Type of Home: House Home Access: Level entry (to lower level where bed can be placed and 1/2 bath is) Entrance Stairs-Number of Steps: 6 Home Layout: Two level;Bed/bath upstairs;Other (Comment) (inside house, 6 steps R rail to midllevel, 6 steps L rail to upper level where full bath is) Alternate Level Stairs-Number of Steps: flight Additional Comments: wife present to confirm PLOF and home setup; pt walked several times per  week in neighborhood, prior to admission  Lives With: Spouse Prior Function Level of Independence: Independent with basic ADLs;Independent with gait;Independent with transfers  Able to Take  Stairs?: Yes Driving: Yes 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   Pt was working 32 hours/wk at [employer]  refer to [employer] Vision/Perception  Vision - History Baseline Vision: Wears glasses all the time Patient Visual Report: No change from baseline Vision - Assessment Eye Alignment: Within Functional Limits Vision Assessment: Vision not tested Perception Perception: Impaired Inattention/Neglect: Does not attend to left side of body Body Scheme: Pt with poor awareness of his position in space Praxis Praxis: Impaired Praxis Impairment Details: Motor planning Praxis-Other Comments: impaired during functional transfers for body positioning  Cognition Arousal/Alertness: Awake/alert Orientation Level: Oriented X4 Sensation Sensation Light Touch: Impaired Detail (absent entire LLE) Light Touch Impaired Details: Absent LUE;Absent LLE Stereognosis: Impaired by gross assessment Hot/Cold: Impaired by gross assessment Proprioception: Impaired by gross assessment Proprioception Impaired Details: Absent LLE;Absent LUE Coordination Gross Motor Movements are Fluid and Coordinated: No Fine Motor Movements are Fluid and Coordinated: No Finger Nose Finger Test: Pt is able to reach hand to nose, but poor sensation and strength his movements appear ataxic Heel Shin Test: impaired speed, excursion and accuracy Motor  Motor Motor: Hemiplegia Mobility    refer to FIM Trunk/Postural Assessment  Cervical Assessment Cervical Assessment: Within Functional Limits Thoracic Assessment Thoracic Assessment: Within Functional Limits Lumbar Assessment Lumbar Assessment: Exceptions to WFL (increaaed wt bearing L pelvis in sitting) Postural Control Postural Control: Deficits on evaluation Postural Limitations: leans to left, able to self correct with verbal/ visual cues   Balance Balance Balance Assessed: Yes Dynamic Sitting Balance Sitting balance - Comments: Pt required verbal cues to  find and maintain midline at times. Static Standing Balance Static Standing - Level of Assistance: 3: Mod assist Extremity/Trunk Assessment RUE Assessment RUE Assessment: Within Functional Limits LUE Assessment LUE Assessment: Exceptions to [facility] LUE AROM (degrees) Left Shoulder Flexion: 90 Degrees LUE Strength  Left Shoulder Flexion: 3-/5 Left Elbow Flexion: 4/5 Left Elbow Extension: 4/5 Gross Grasp: Functional  FIM:  FIM - Grooming Grooming Steps: Wash, rinse, dry face;Wash, rinse, dry hands;Oral care, brush teeth, clean dentures Grooming: 4: Steadying assist  or patient completes 3 of 4 or 4 of 5 steps FIM - Bathing Bathing Steps Patient Completed: Left Arm;Chest;Right upper leg;Left upper leg;Abdomen;Buttocks;Front perineal area Bathing: 3: Mod-Patient completes 5-7 [id number] parts or 50-74% FIM - Upper Body Dressing/Undressing Upper body dressing/undressing steps patient completed: Pull shirt over trunk;Put head through opening of pull over shirt/dress Upper body dressing/undressing: 3: Mod-Patient completed 50-74% of tasks FIM - Lower Body Dressing/Undressing Lower body dressing/undressing: 1: Total-Patient completed less than 25% of tasks FIM - TAir cabin crewTransfers: 0-Activity did not occur FIM - TCamera operatorTransfers: 0-Activity did not occur or was simulated   Refer to Care Plan for Long Term Goals  Recommendations for other services: None  Discharge Criteria: Patient will be discharged from OT if patient refuses treatment 3 consecutive times without medical reason, if treatment goals not met, if there is a change in medical status, if patient makes no progress towards goals or if patient is discharged from hospital.  The above assessment, treatment plan, treatment alternatives and goals were discussed and mutually agreed  upon: by patient and by family  SAGUIER,JULIA 08/08/2013, 11:59 AM

## 2013-08-08 NOTE — Evaluation (Addendum)
Physical Therapy Assessment and Plan  Patient Details  Name: Jack [name] MRN: [id number] Date of Birth: 06-06-1934  PT Diagnosis: Difficulty walking, Hemiparesis dominant, Impaired sensation and Muscle weakness Rehab Potential: Good ELOS: 114-17 days   Today's Date: 08/08/2013 Time:0800-0900, 1300-1330 60 min and 30 min individual therapy  -     Problem List:  Patient Active Problem List   Diagnosis Date Noted  . Bleeding in brain due to blood pressure disorder 08/07/2013  . ICH (intracerebral hemorrhage) 08/02/2013  . Acute hemorrhagic infarction of brain 08/02/2013  . Hx of mitral valve repair   . History of stroke   . Sleep apnea 07/02/2011  . History of atrial fibrillation   . Cyst and pseudocyst of pancreas 09/13/2007  . Hypertension   . Premature ventricular contraction   . Hiatal hernia   . History of renal cyst   . BPH (benign prostatic hypertrophy)     Past Medical History:  Past Medical History  Diagnosis Date  . Hypertension   . ED (erectile dysfunction)   . CVA (cerebral infarction) 2009  . Atrial fibrillation     followed by Dr. Wynonia Lawman  . Hyperlipidemia   . Patent foramen ovale     history of  . Microscopic hematuria   . Elevated PSA     [facility], Dr. Serita Butcher;   . Cellulitis of face 11/08    [facility]   Past Surgical History:  Past Surgical History  Procedure Laterality Date  . Mitral valve repair      Assessment & Plan Clinical Impression: Jack [name] is a 80 y.o. left-handed male with history of atrial fibrillation, hypertension, PFO, CVA 2009 not on anticoagulations and very little residual weakness. Admitted 08/02/2013 with left-sided weakness. Cranial CT scan showed 17 x 21 hemorrhagic infarcts posterior right basal ganglia and progression of microvascular disease. His his systolic blood pressure 509/32 on arrival. Follow cranial CT scan 08/04/2013 with stable hypertensive hemorrhage that's stable and mild vasogenic edema.  Patient transferred to CIR on 08/07/2013 .   Patient currently requires total +2 assist with mobility secondary to impaired timing and sequencing, unbalanced muscle activation, decreased coordination, decreased motor planning and absent sensation LLE.  Prior to hospitalization, patient was independent  with mobility and lived with Spouse in a tri level House home.  Home access is level entry (to lower level where bed can be placed and 1/2 bath is). To access full bath on upper level, pt must use 2 sets of 6 steps with 1 rail.  Patient will benefit from skilled PT intervention to maximize safe functional mobility, minimize fall risk and decrease caregiver burden for planned discharge home with 24 hour assist.  Anticipate patient will benefit from follow up [facility] at discharge.  PT - End of Session Activity Tolerance: Tolerates < 10 min activity Endurance Deficit: not apparent at eval Endurance Deficit Description: fatigued after standing x 30 seconds PT Assessment Rehab Potential: Good Barriers to Discharge: Inaccessible home environment PT Patient demonstrates impairments in the following area(s): Balance;Endurance;Motor;Sensory;Safety PT Transfers Functional Problem(s): Bed Mobility;Bed to Chair;Car;Furniture PT Locomotion Functional Problem(s): Ambulation;Wheelchair Mobility;Stairs PT Plan PT Intensity: Minimum of 1-2 x/day ,45 to 90 minutes PT Frequency: 5 out of 7 days PT Duration Estimated Length of Stay: 114-17 days PT Treatment/Interventions: Ambulation/gait training;Balance/vestibular training;Discharge planning;Community reintegration;Neuromuscular re-education;Functional mobility training;Functional electrical stimulation;DME/adaptive equipment instruction;Patient/family education;Psychosocial support;Splinting/orthotics;UE/LE Coordination activities;UE/LE Strength taining/ROM;Therapeutic Exercise;Therapeutic Activities;Wheelchair propulsion/positioning;Stair training PT Transfers  Anticipated Outcome(s): supervision basic and car PT Locomotion Anticipated Outcome(s): supervision  w/c x 150', supervision gait x 150' , min assist up/down 6 steps 1 rail PT Recommendation Follow Up Recommendations: Home health PT Patient destination: Home Equipment Recommended: Rolling walker with 5" wheels  Skilled Therapeutic Intervention- treatment today AM- w/c propulsion using hemi technique x 100' with mod assist for steering; pt having difficulty coordination RUE and RLE.  LLE functional movement on/off w/c footplate with mod assist.  PM- no pain; w/c propulsion using hemi technique x 100' with mod assist.  W/c>< mat squat pivot transfers with mod/max assist, demo to keep R hand lightly on surface for improved body- in -space awareness, with mod VCs before and during transfer.  Functional activity in supported sitting on mat, R hand retrieval and placement of objects requiring shortening/lengthening/rotating of trunk, L and R lateral leans.  PT Evaluation Precautions/Restrictions Precautions Precautions: Fall General   Vital SignsTherapy Vitals BP: 148/88 mmHg Pain Pain Assessment Pain Assessment: No/denies pain Home Living/Prior Functioning Home Living Available Help at Discharge: Family;Available 24 hours/day Type of Home: House Home Access: Level entry (to lower level where bed can be placed and 1/2 bath is) Entrance Stairs-Number of Steps: 6 Home Layout: Two level;Bed/bath upstairs;Other (Comment) (inside house, 6 steps R rail to midllevel, 6 steps L rail to upper level where full bath is) Alternate Level Stairs-Number of Steps: flight Additional Comments: wife present to confirm PLOF and home setup; pt walked several times per  week in neighborhood, prior to admission  Lives With: Spouse Prior Function  Able to Take Stairs?: Yes Driving: Yes Comments: pt driving and completely independent with ADLs.   Pt was working 32 hours/wk at [employer]: Wears glasses all the time Patient Visual Report: No change from baseline  Cognition Arousal/Alertness: Awake/alert Orientation Level: Oriented X4 Sensation Sensation Light Touch: Impaired Detail (absent entire LLE) Light Touch Impaired Details: Absent LLE Proprioception: Impaired Detail (absent L knee and ankle) Proprioception Impaired Details: Absent LLE Coordination Heel Shin Test: impaired speed, excursion and accuracy Motor  Motor Motor: Hemiplegia  Mobility Transfers Squat Pivot Transfers: 1: +1 Total assist Squat Pivot Transfer Details: Verbal cues for sequencing;Verbal cues for technique;Tactile cues for sequencing Squat Pivot Transfer Details (indicate cue type and reason): poor motor planning? difficult to assess due to [facility] Locomotion  Ambulation Ambulation: Yes Ambulation/Gait Assistance: 1: +2 Total assist Ambulation Distance (Feet): 8 Feet Assistive device: None Ambulation/Gait Assistance Details: Manual facilitation for weight bearing;Manual facilitation for placement Gait Gait: Yes Gait Pattern: Impaired Gait Pattern: Step-to pattern;Decreased step length - right;Decreased stance time - left;Decreased hip/knee flexion - left;Decreased dorsiflexion - left;Lateral hip instability;Left foot flat;Decreased trunk rotation;Narrow base of support;Trunk rotated posteriorly on left;Trunk flexed;Left genu recurvatum Gait velocity: significantly reduced  Stairs / Additional Locomotion Stairs: Yes Stairs Assistance: 1: +2 Total assist Stairs Assistance Details: Tactile cues for sequencing;Verbal cues for sequencing;Verbal cues for technique;Manual facilitation for placement Stair Management Technique: Two rails;Backwards;Step to pattern (backwards descending) Number of Stairs: 3 Wheelchair Mobility Wheelchair Mobility: Yes Wheelchair Assistance: 2: Max Technical sales engineer Details: Manual facilitation for  placement;Visual cues/gestures for Information systems manager: Both upper extremities Wheelchair Parts Management: Needs assistance Distance: 15  Trunk/Postural Assessment  Cervical Assessment Cervical Assessment: Within Functional Limits Thoracic Assessment Thoracic Assessment: Within Functional Limits Lumbar Assessment Lumbar Assessment: Exceptions to WFL (increaaed wt bearing L pelvis in sitting) Postural Control Postural Control: Deficits on evaluation  Balance Balance Balance Assessed: Yes Dynamic Sitting Balance Sitting balance - Comments: Pt required  verbal cues to  find and maintain midline at times. Static Standing Balance Static Standing - Level of Assistance: 3: Mod assist Extremity Assessment      RLE Assessment RLE Assessment: Within Functional Limits LLE Assessment LLE Assessment: Exceptions to [facility] LLE Strength LLE Overall Strength Comments: grossly in sitting- hip flexion, knee ext 4/5; hip abduction, knee flex 4-/5; ankle DF 3-/5, mostly inversion  FIM:   FIM - Locomotion: Wheelchair Distance: 15 FIM - Locomotion: Ambulation Ambulation/Gait Assistance: 1: +2 Total assist  Stairs: 1: +2 total assist Transfers: 1: total assist  Refer to Care Plan for Long Term Goals  Recommendations for other services: None  Discharge Criteria: Patient will be discharged from PT if patient refuses treatment 3 consecutive times without medical reason, if treatment goals not met, if there is a change in medical status, if patient makes no progress towards goals or if patient is discharged from hospital.  The above assessment, treatment plan, treatment alternatives and goals were discussed and mutually agreed upon: by patient and by family  Cythina Mickelsen 08/08/2013, 9:28 AM

## 2013-08-08 NOTE — Evaluation (Signed)
Speech Language Pathology Assessment and Plan  Patient Details  Name: Jack [name] MRN: [id number] Date of Birth: 12/21/1933  SLP Diagnosis: Dysphagia  Rehab Potential: Excellent ELOS: 14-18 days   Today's Date: 08/08/2013 Time: 6294-7654 Time Calculation (min): 61 min  Problem List:  Patient Active Problem List   Diagnosis Date Noted  . Bleeding in brain due to blood pressure disorder 08/07/2013  . ICH (intracerebral hemorrhage) 08/02/2013  . Acute hemorrhagic infarction of brain 08/02/2013  . Hx of mitral valve repair   . History of stroke   . Sleep apnea 07/02/2011  . History of atrial fibrillation   . Cyst and pseudocyst of pancreas 09/13/2007  . Hypertension   . Premature ventricular contraction   . Hiatal hernia   . History of renal cyst   . BPH (benign prostatic hypertrophy)    Past Medical History:  Past Medical History  Diagnosis Date  . Hypertension   . ED (erectile dysfunction)   . CVA (cerebral infarction) 2009  . Atrial fibrillation     followed by Dr. Wynonia Lawman  . Hyperlipidemia   . Patent foramen ovale     history of  . Microscopic hematuria   . Elevated PSA     [facility], Dr. Serita Butcher;   . Cellulitis of face 11/08    [facility]   Past Surgical History:  Past Surgical History  Procedure Laterality Date  . Mitral valve repair      Assessment / Plan / Recommendation Clinical Impression Pt is a 80 y.o. left-handed male with h/o a fib, hypertension, PFO, CVA 2009 not on anticoagulations and very little residual weakness. Admitted 08/02/13 with left-sided weakness. Cranial CT scan showed 17 x 21 hemorrhagic infarcts posterior right basal ganglia and progression of microvascular disease. His systolic blood pressure 650/35 on arrival. Follow cranial CT scan 08/04/13 with stable hypertensive hemorrhage that's stable and mild vasogenic edema. Patient enrolled in ATACH-II study for BP control. Neurology recommends repeat CCT in 2-3 weeks to decide on  antiplatelet therapy. MBS done yesterday and patient advanced to D3, thin liquids due to oropharyngeal dysphagia. Patient with resultant left hemiparesis and dysphagia. Rehab team recommended CIR and patient admitted 1/27; SLP bedside-swallow and cognitive-linguistic evaluations completed. Pt presents with mildly reduced vocal intensity and decreased recall of new information, however pt and his wife report that this is his baseline level of functioning. Wife also reports that she handles many of the household responsibilities, such as filling his pill box weekly. Pt demonstrated coughing with straw sips of thin liquid, however not with cup sips. Pt consumed advanced trials of regular textures, demonstrating adequate mastication and oral clearance. Recommend to advance to regular textures and thin liquids with no straws. Pt will benefit from SLP services to maximize swallowing safety prior to discharge home with family.   Skilled Therapeutic Interventions          Cognitive-linguistic and bedside-swallow evaluations completed with results and recommendations reviewed with patient and his wife, [name].    SLP Assessment  Patient will need skilled Speech Lanaguage Pathology Services during CIR admission    Recommendations  Diet Recommendations: Regular;Thin liquid Liquid Administration via: Cup;No straw Medication Administration: Whole meds with puree Supervision: Patient able to self feed;Intermittent supervision to cue for compensatory strategies Compensations: Slow rate;Small sips/bites Postural Changes and/or Swallow Maneuvers: Seated upright 90 degrees;Upright 30-60 min after meal Oral Care Recommendations: Oral care BID Patient destination: Home Follow up Recommendations: Other (comment) (TBD- no further SLP f/u anticipated) Equipment Recommended:  None recommended by SLP    SLP Frequency 5 out of 7 days   SLP Treatment/Interventions Cueing hierarchy;Dysphagia/aspiration precaution  training;Environmental controls;Internal/external aids;Patient/family education    Pain Pain Assessment Pain Assessment: No/denies pain Prior Functioning Cognitive/Linguistic Baseline: Baseline deficits Baseline deficit details: Pt/wife report baseline cognitive deficits including memory impairments Type of Home: House  Lives With: Spouse Available Help at Discharge: Family;Available 24 hours/day Vocation: Part time employment (drives cars)  Short Term Goals: Week 1: SLP Short Term Goal 1 (Week 1): Pt will consume regular textures and thin liquids with minimal overt s/s of aspiration, using his swallowing strategies with Mod I SLP Short Term Goal 2 (Week 1): Pt will recall safe swallowing strategies with supervision level cueing SLP Short Term Goal 3 (Week 1): Pt will consume therapeutic trials of thin liquids via straw sips to assess readiness for advancement  See FIM for current functional status Refer to Care Plan for Long Term Goals  Recommendations for other services: None  Discharge Criteria: Patient will be discharged from SLP if patient refuses treatment 3 consecutive times without medical reason, if treatment goals not met, if there is a change in medical status, if patient makes no progress towards goals or if patient is discharged from hospital.  The above assessment, treatment plan, treatment alternatives and goals were discussed and mutually agreed upon: by patient and by family   Germain Osgood, M.A. CCC-SLP [phone]   Germain Osgood 08/08/2013, 4:41 PM

## 2013-08-08 NOTE — Patient Care Conference (Signed)
Inpatient RehabilitationTeam Conference and Plan of Care Update Date: 08/08/2013   Time: 10;55 AM    Patient Name: Jack [name]      Medical Record Number: [id number]  Date of Birth: [birth year] Sex: Male         Room/Bed: 4M09C/4M09C-01 Payor Info: Payor: Marine scientist / Plan: AARP MEDICARE COMPLETE / Product Type: *No Product type* /    Admitting Diagnosis: CVA  Admit Date/Time:  08/07/2013  6:17 PM Admission Comments: No comment available   Primary Diagnosis:  Bleeding in brain due to blood pressure disorder Principal Problem: Bleeding in brain due to blood pressure disorder  Patient Active Problem List   Diagnosis Date Noted  . Bleeding in brain due to blood pressure disorder 08/07/2013  . ICH (intracerebral hemorrhage) 08/02/2013  . Acute hemorrhagic infarction of brain 08/02/2013  . Hx of mitral valve repair   . History of stroke   . Sleep apnea 07/02/2011  . History of atrial fibrillation   . Cyst and pseudocyst of pancreas 09/13/2007  . Hypertension   . Premature ventricular contraction   . Hiatal hernia   . History of renal cyst   . BPH (benign prostatic hypertrophy)     Expected Discharge Date: Expected Discharge Date: 08/23/13  Team Members Present: Physician leading conference: Dr. Alysia Penna Social Worker Present: Alfonse Alpers, LCSW;Becky Taeshawn Helfman, LCSW Nurse Present: Other (comment) Oneita Hurt) PT Present: Georjean Mode, PT;Other (comment) Jilda Roche) OT Present: Simonne Come, OT;Kris Gellert, Maryella Shivers, OT SLP Present: Germain Osgood, SLP PPS Coordinator present : Daiva Nakayama, RN, CRRN     Current Status/Progress Goal Weekly Team Focus  Medical   poor sleep but no other issues, urinary inc vs spill of urinal  maintain stability  eval continence   Bowel/Bladder   Continent of Bowel/bladder. Wears condom cath at [place] of Bowel and bladder  Minimal assist   Swallow/Nutrition/ Hydration   advanced to  regular textures and thin liquids without straws, intermittent suprevision  Mod I with least restrictive PO  assess diet tolerance, increase use of swallowing strategies, trials with straw sips   ADL's     eval pending        Mobility   max assist w/c<> mat; +2 gait x 8' and +3 up/down 3 steps  S transfers, S gait x 150' and w/c 150'; min assist 6 steps 1 rail  neuro re-ed, bed mobility, transfers, pre-gait and gait, midline orientation   Communication     eval pending        Safety/Cognition/ Behavioral Observations    no unsafe behaviors         Pain   No complaints of pain  <3 on a scale of 0-10  Assess pain q 2hr and reassess after medication intervention   Skin   Rash to upper and lower back, Benedryl PRN  No skin breakdown while on rehab  Assess skin q shift      *See Care Plan and progress notes for long and short-term goals.  Barriers to Discharge: max A    Possible Resolutions to Barriers:  Therapy evals    Discharge Planning/Teaching Needs:  Home with wife, who can provide assist.  Plans to be here daily to observe pt in therapies.      Team Discussion:  New eval-goals supervision/min level, slept poorly, no sensation on left side  Revisions to Treatment Plan:  New eval   Continued Need for Acute Rehabilitation Level of Care: The  patient requires daily medical management by a physician with specialized training in physical medicine and rehabilitation for the following conditions: Daily direction of a multidisciplinary physical rehabilitation program to ensure safe treatment while eliciting the highest outcome that is of practical value to the patient.: Yes Daily medical management of patient stability for increased activity during participation in an intensive rehabilitation regime.: Yes Daily analysis of laboratory values and/or radiology reports with any subsequent need for medication adjustment of medical intervention for : Neurological problems  Ballard Budney, Gardiner Rhyme 08/09/2013, 9:28 AM

## 2013-08-08 NOTE — Progress Notes (Signed)
Social Work Patient ID: Jack [name], male   DOB: [birth year], 80 y.o.   MRN: [id number] Met with pt and wife to inform of team conference goals-supervision level with cues or set-up and discharge date of 2/12. Wife is hopeful he will reach these goals she needs him to be this level.  She observed him in therapies today.  Will work toward On discharge plans.

## 2013-08-08 NOTE — Care Management Note (Signed)
Inpatient [facility] Individual Statement of Services  Patient Name:  Jack [name]  Date:  08/08/2013  Welcome to the [facility].  Our goal is to provide you with an individualized program based on your diagnosis and situation, designed to meet your specific needs.  With this comprehensive rehabilitation program, you will be expected to participate in at least 3 hours of rehabilitation therapies Monday-Friday, with modified therapy programming on the weekends.  Your rehabilitation program will include the following services:  Physical Therapy (PT), Occupational Therapy (OT), Speech Therapy (ST), 24 hour per day rehabilitation nursing, Therapeutic Recreaction (TR), Case Management (Social Worker), Rehabilitation Medicine, Nutrition Services and Pharmacy Services  Weekly team conferences will be held on Wednesday to discuss your progress.  Your Social Worker will talk with you frequently to get your input and to update you on team discussions.  Team conferences with you and your family in attendance may also be held.  Expected length of stay: 14-17 days Overall anticipated outcome: supervision with cues or set-up  Depending on your progress and recovery, your program may change. Your Social Worker will coordinate services and will keep you informed of any changes. Your Social Worker's name and contact numbers are listed  below.  The following services may also be recommended but are not provided by the [facility] will be made to provide these services after discharge if needed.  Arrangements include referral to agencies that provide these services.  Your insurance has been verified to be:  [employer]-Medicare Your primary doctor is:  Dr Chana Bode  Pertinent information will be shared with your doctor  and your insurance company.  Social Worker:  Ovidio Kin, [place] or [phone]  Information discussed with and copy given to patient by: Elease Hashimoto, 08/08/2013, 3:27 PM

## 2013-08-09 ENCOUNTER — Inpatient Hospital Stay (HOSPITAL_COMMUNITY): Payer: Medicare Other

## 2013-08-09 ENCOUNTER — Inpatient Hospital Stay (HOSPITAL_COMMUNITY): Payer: Medicare Other | Admitting: Occupational Therapy

## 2013-08-09 NOTE — Progress Notes (Signed)
Subjective/Complaints: 80 y.o. left-handed male with history of atrial fibrillation, hypertension, PFO, CVA 2009 not on anticoagulations and very little residual weakness. Admitted 08/02/2013 with left-sided weakness. Cranial CT scan showed 17 x 21 hemorrhagic infarcts posterior right basal ganglia and progression of microvascular disease. His his systolic blood pressure 161/09 on arrival. Follow cranial CT scan 08/04/2013 with stable hypertensive hemorrhage that's stable and mild vasogenic edema. Patient enrolled in ATACH-II study for BP control. Neurology recommends repeat CCT in 2-3 weeks to decide on antiplatelet therapy  No issues overnite except poor sleep Review of Systems - Negative except Left side weak  Objective: Vital Signs: Blood pressure 110/61, pulse 73, temperature 97.5 F (36.4 C), temperature source Oral, resp. rate 18, height _0  (1.6 m), weight 63.8 kg (140 lb 10.5 oz), SpO2 97.00%. No results found. Results for orders placed during the hospital encounter of 08/07/13 (from the past 72 hour(s))  CBC WITH DIFFERENTIAL     Status: None   Collection Time    08/08/13  9:00 AM      Result Value Range   WBC 8.8  4.0 - 10.5 K/uL   RBC 4.99  4.22 - 5.81 MIL/uL   Hemoglobin 15.2  13.0 - 17.0 g/dL   HCT 43.4  39.0 - 52.0 %   MCV 87.0  78.0 - 100.0 fL   MCH 30.5  26.0 - 34.0 pg   MCHC 35.0  30.0 - 36.0 g/dL   RDW 13.7  11.5 - 15.5 %   Platelets 334  150 - 400 K/uL   Neutrophils Relative % 74  43 - 77 %   Neutro Abs 6.6  1.7 - 7.7 K/uL   Lymphocytes Relative 14  12 - 46 %   Lymphs Abs 1.2  0.7 - 4.0 K/uL   Monocytes Relative 8  3 - 12 %   Monocytes Absolute 0.7  0.1 - 1.0 K/uL   Eosinophils Relative 3  0 - 5 %   Eosinophils Absolute 0.3  0.0 - 0.7 K/uL   Basophils Relative 0  0 - 1 %   Basophils Absolute 0.0  0.0 - 0.1 K/uL  COMPREHENSIVE METABOLIC PANEL     Status: Abnormal   Collection Time    08/08/13  9:00 AM      Result Value Range   Sodium 141  137 - 147 mEq/L    Potassium 3.4 (*) 3.7 - 5.3 mEq/L   Chloride 103  96 - 112 mEq/L   CO2 25  19 - 32 mEq/L   Glucose, Bld 106 (*) 70 - 99 mg/dL   BUN 14  6 - 23 mg/dL   Creatinine, Ser 0.87  0.50 - 1.35 mg/dL   Calcium 8.8  8.4 - 10.5 mg/dL   Total Protein 6.9  6.0 - 8.3 g/dL   Albumin 3.6  3.5 - 5.2 g/dL   AST 17  0 - 37 U/L   ALT 12  0 - 53 U/L   Alkaline Phosphatase 42  39 - 117 U/L   Total Bilirubin 0.8  0.3 - 1.2 mg/dL   GFR calc non Af Amer 81 (*) >90 mL/min   GFR calc Af Amer >90  >90 mL/min   Comment: (NOTE)     The eGFR has been calculated using the CKD EPI equation.     This calculation has not been validated in all clinical situations.     eGFR's persistently <90 mL/min signify possible Chronic Kidney     Disease.  Vitals reviewed.  Constitutional: He is oriented to person, place, and time. No distress  HEENT: oral mucosa moist. Missing a few teeth  Eyes:  Pupils reactive to light  Neck: Normal range of motion. Neck supple. No thyromegaly present.  Cardiovascular:  Rate controlled, irregular rhythm, small sem  Respiratory: Effort normal and breath sounds normal. No respiratory distress. No wheezes or rales  GI: Soft. Bowel sounds are normal. He exhibits no distension.  Neurological: He is alert and oriented to person, place, and time.  Flat affect. Follows commands. Speech is mildly dysarthric but intelligible. Improving oromotor control. A little slow, but better response time toda. LUE is grossly 2 to 2+ but inconsistent, may have some apraxia. LLE is better at 3/5 HF, KE and 4- ADF/APF. Absent LT LUE and LLE. Limited insight and awareness.  Skin: Skin is warm and dry. Right antecubital IV site appears inflamed.   Assessment/Plan: 1. Functional deficits secondary to Right basal ganglia hemmorrhagic infarct which require 3+ hours per day of interdisciplinary therapy in a comprehensive inpatient rehab setting. Physiatrist is providing close team supervision and 24 hour  management of active medical problems listed below. Physiatrist and rehab team continue to assess barriers to discharge/monitor patient progress toward functional and medical goals. FIM: FIM - Bathing Bathing Steps Patient Completed: Left Arm;Chest;Right upper leg;Left upper leg;Abdomen;Buttocks;Front perineal area Bathing: 3: Mod-Patient completes 5-7 65f10 parts or 50-74%  FIM - Upper Body Dressing/Undressing Upper body dressing/undressing steps patient completed: Pull shirt over trunk;Put head through opening of pull over shirt/dress Upper body dressing/undressing: 3: Mod-Patient completed 50-74% of tasks FIM - Lower Body Dressing/Undressing Lower body dressing/undressing: 1: Total-Patient completed less than 25% of tasks     FIM - TAir cabin crewTransfers: 0-Activity did not occur  FIM - BControl and instrumentation engineerDevices: Arm rests Bed/Chair Transfer: 1: Chair or W/C > Bed: Total A (helper does all/Pt. < 25%)  FIM - Locomotion: Wheelchair Distance: 15 Locomotion: Wheelchair: 1: Travels less than 50 ft with maximal assistance (Pt: 25 - 49%) FIM - Locomotion: Ambulation Locomotion: Ambulation Assistive Devices: Other (comment) (none) Ambulation/Gait Assistance: 1: +2 Total assist Locomotion: Ambulation: 1: Two helpers  Comprehension Comprehension Mode: Auditory Comprehension: 5-Understands complex 90% of the time/Cues < 10% of the time  Expression Expression Mode: Verbal Expression: 5-Expresses complex 90% of the time/cues < 10% of the time  Social Interaction Social Interaction: 4-Interacts appropriately 75 - 89% of the time - Needs redirection for appropriate language or to initiate interaction.  Problem Solving Problem Solving: 5-Solves basic problems: With no assist  Memory Memory: 4-Recognizes or recalls 75 - 89% of the time/requires cueing 10 - 24% of the time   Medical Problem List and Plan:  Hemorrhagic right basal ganglia  infarcts  1. DVT Prophylaxis/Anticoagulation: Pharmaceutical: Lovenox  2. Pain Management: N/A  3. Mood: No signs of distress noted. LCSW to follow for evaluation.  4. Neuropsych: This patient is capable of making decisions on his own behalf.  5. HTN: Monitor with qid checks. Continue HCTZ, Avapro and Norvasc.  6. BPH: continue proscar.   LOS (Days) 2 A FACE TO FACE EVALUATION WAS PERFORMED  KIRSTEINS,ANDREW E 08/09/2013, 8:50 AM

## 2013-08-09 NOTE — IPOC Note (Signed)
Overall Plan of Care [facility]) Patient Details Name: Jack [name] MRN: [id number] DOB: 1933/09/12  Admitting Diagnosis: CVA  Hospital Problems: Principal Problem:   Bleeding in brain due to blood pressure disorder Active Problems:   Hypertension   History of atrial fibrillation     Functional Problem List: Nursing Safety;Sensory  PT Balance;Endurance;Motor;Sensory;Safety  OT Balance;Endurance;Motor;Perception;Sensory;Safety  SLP    TR         Basic ADL's: OT Eating;Grooming;Bathing;Dressing;Toileting     Advanced  ADL's: OT       Transfers: PT Bed Mobility;Bed to Chair;Car;Furniture  OT Toilet;Tub/Shower     Locomotion: PT Ambulation;Wheelchair Mobility;Stairs     Additional Impairments: OT Fuctional Use of Upper Extremity  SLP Swallowing      TR      Anticipated Outcomes Item Anticipated Outcome  Self Feeding set up  Swallowing  Mod I with least restrictive PO   Basic self-care  supervision  Toileting  supervision   Bathroom Transfers supervision  Bowel/Bladder  Mod I  Transfers  supervision basic and car  Locomotion  supervision w/c x 150', supervision gait x 150' , min assist up/down 6 steps 1 rail  Communication     Cognition     Pain  <3  Safety/Judgment  Supervision   Therapy Plan: PT Intensity: Minimum of 1-2 x/day ,45 to 90 minutes PT Frequency: 5 out of 7 days PT Duration Estimated Length of Stay: 114-17 days OT Intensity: Minimum of 1-2 x/day, 45 to 90 minutes OT Frequency: 5 out of 7 days OT Duration/Estimated Length of Stay: 14- 18 days SLP Intensity: Minumum of 1-2 x/day, 30 to 90 minutes SLP Frequency: 5 out of 7 days SLP Duration/Estimated Length of Stay: 14-18 days       Team Interventions: Nursing Interventions Patient/Family Education;Disease Management/Prevention  PT interventions Ambulation/gait training;Balance/vestibular training;Discharge planning;Community reintegration;Neuromuscular re-education;Functional  mobility training;Functional electrical stimulation;DME/adaptive equipment instruction;Patient/family education;Psychosocial support;Splinting/orthotics;UE/LE Coordination activities;UE/LE Strength taining/ROM;Therapeutic Exercise;Therapeutic Activities;Wheelchair propulsion/positioning;Stair training  OT Interventions Publishing copy;Functional mobility training;Neuromuscular re-education;Patient/family education;Self Care/advanced ADL retraining;Therapeutic Activities;UE/LE Strength taining/ROM;Visual/perceptual remediation/compensation;UE/LE Coordination activities;Therapeutic Exercise  SLP Interventions Cueing hierarchy;Dysphagia/aspiration precaution training;Environmental controls;Internal/external aids;Patient/family education  TR Interventions    SW/CM Interventions Discharge Planning;Psychosocial Support;Patient/Family Education    Team Discharge Planning: Destination: PT-Home ,OT- Home , SLP-Home Projected Follow-up: PT-Home health PT, OT-  Home health OT, SLP-Other (comment) (TBD- no further SLP f/u anticipated) Projected Equipment Needs: PT-Rolling walker with 5" wheels, OT- 3 in 1 bedside comode;Tub/shower seat, SLP-None recommended by SLP Equipment Details: PT- , OT-  Patient/family involved in discharge planning: PT- Patient;Family member/caregiver,  OT-Patient;Family member/caregiver, SLP-Patient;Family member/caregiver  MD ELOS: 14-18 days Medical Rehab Prognosis:  Good Assessment: 80 y.o. left-handed male with history of atrial fibrillation, hypertension, PFO, CVA 2009 not on anticoagulations and very little residual weakness. Admitted 08/02/2013 with left-sided weakness. Cranial CT scan showed 17 x 21 hemorrhagic infarcts posterior right basal ganglia and progression of microvascular disease. His his systolic blood pressure 536/46 on arrival. Follow cranial CT scan 08/04/2013 with stable hypertensive hemorrhage  that's stable and mild vasogenic edema. Patient enrolled in ATACH-II study for BP control. Neurology recommends repeat CCT in 2-3 weeks to decide on antiplatelet therapy. MBS done yesterday and patient advanced to D3 Thin liquids  Now requiring 24/7 Rehab RN,MD, as well as CIR level PT, OT and SLP.  Treatment team will focus on ADLs and mobility with goals set at Sup    See Team Conference Notes for weekly updates to the plan of care

## 2013-08-09 NOTE — Progress Notes (Signed)
Occupational Therapy Session Note  Patient Details  Name: Jack [name] MRN: [id number] Date of Birth: Mar 16, 1934  Today's Date: 08/09/2013 Time: 0800-0900 Time Calculation (min): 60 min  Short Term Goals: Week 1:  OT Short Term Goal 1 (Week 1): Pt will be able to maintain static standing balance with min A to increase his ability to manage clothing over his hips. OT Short Term Goal 2 (Week 1): Pt will be able to don shirt with supervision. OT Short Term Goal 3 (Week 1): Pt will be able to don pants with mod A. OT Short Term Goal 4 (Week 1): Pt will be able to transfer to toilet with mod A x1. OT Short Term Goal 5 (Week 1): Pt will demonstrate increased functional use of his L hand by holding wash cloth and washing R arm.  Skilled Therapeutic Interventions/Progress Updates:      Pt seen for BADL retraining of toileting, bathing, and dressing with a focus on trunk control, left side awareness, use of left arm.  Pt sat to EOB with mod A. He was leaning to the left but was able to correct and return to midline with cues.  Pt transferred bed to w/c to toilet back to w/c with max a to control L side. He improved with forward lean.  Pt also showed improvement with lifting L arm to 100 degrees and maintaining static stand with mod a as he washed his bottom. Pt demonstrated good attention and activity tolerance.  Pt resting in w/c at end of session with wife in the room.  Therapy Documentation Precautions:  Precautions Precautions: Fall Precaution Comments: Severely impaired LUE/LLE sensation Restrictions Weight Bearing Restrictions: No     Pain: Pain Assessment Pain Assessment: No/denies pain ADL:  See FIM for current functional status  Therapy/Group: Individual Therapy  Arturo Freundlich 08/09/2013, 10:05 AM

## 2013-08-09 NOTE — Plan of Care (Signed)
Problem: RH BLADDER ELIMINATION Goal: RH STG MANAGE BLADDER WITH MEDICATION WITH ASSISTANCE STG Manage Bladder With Medication With minimal Assistance.  Outcome: Progressing Incontinent at times.

## 2013-08-09 NOTE — Progress Notes (Signed)
Speech Language Pathology Daily Session Note  Patient Details  Name: Jack [name] MRN: [id number] Date of Birth: 19-Feb-1934  Today's Date: 08/09/2013 Time: 7829-5621 Time Calculation (min): 43 min  Short Term Goals: Week 1: SLP Short Term Goal 1 (Week 1): Pt will consume regular textures and thin liquids with minimal overt s/s of aspiration, using his swallowing strategies with Mod I SLP Short Term Goal 2 (Week 1): Pt will recall safe swallowing strategies with supervision level cueing SLP Short Term Goal 3 (Week 1): Pt will consume therapeutic trials of thin liquids via straw sips to assess readiness for advancement  Skilled Therapeutic Interventions: Skilled treatment focused on swallowing goals. SLP facilitated session with observation of trials of regular textures and thin liquids via straw sips. Prior to engaging in PO trials, pt was able to recall 2 out of 4 swallowing strategies. SLP posted sign on wall for pt to use as external memory aid. Pt consumed PO with no overt s/s of aspiration observed. Pt initially using a chin tuck, with SLP instructing him to keep his head in a neutral position; pt continued to demonstrate adequate airway protection. Pt's wife and son from [place] present and educated regarding current status and goals. Will plan to observe one additional time with straw sips prior to    FIM:  Comprehension Comprehension Mode: Auditory Comprehension: 5-Follows basic conversation/direction: With extra time/assistive device Expression Expression Mode: Verbal Expression: 5-Expresses basic needs/ideas: With extra time/assistive device Social Interaction Social Interaction: 4-Interacts appropriately 75 - 89% of the time - Needs redirection for appropriate language or to initiate interaction. Problem Solving Problem Solving: 5-Solves basic problems: With no assist Memory Memory: 4-Recognizes or recalls 75 - 89% of the time/requires cueing 10 - 24% of the time FIM -  Eating Eating Activity: 5: Supervision/cues;5: Set-up assist for open containers  Pain Pain Assessment Pain Assessment: No/denies pain  Therapy/Group: Individual Therapy   Germain Osgood, M.A. CCC-SLP [phone]   Germain Osgood 08/09/2013, 10:02 AM

## 2013-08-09 NOTE — Progress Notes (Signed)
Physical Therapy Note  Patient Details  Name: Jack [name] MRN: [id number] Date of Birth: 1933-11-08 Today's Date: 08/09/2013 1009-1105 56 min individual therapy 1505-1535 30 min individual therapy No pain reported AM or PM txs  Tx 1:  W/c propulsion using hemi technique with RUE and RLE, x 73' with min assist for steering.  neuromuscular re-education via forced use, demo, VCS, tactile cues for midline orientation and LLE stance stability during step taps with R foot, static and dynamic standing in stander, Kinetron in sitting, LUE gross assist during bil hand task.  Gait with RW , L hand orthosis, x 8' with max assist.  Gait with RW unsafe at this time due to insensate LLE, pt's impulsivity and poor postural control.  Kinetron in sitting x 10 minutes with manual assistance for LLE control, at 20 cm/sec; rated 13 on Borg scale.  Static stander x 10 minutes during RUE activity .  Pt with continual L knee buckling with wt shift to L, controlled by stander and pt.  Tx 2:  neuromuscular re-education via demo, VCs, tactile cues, forced use of LUE (for wt bearing in sitting), LLE and trunk for midline orientation, dynamic sitting balance, anterior pelvic tilt in sitting with neutral hip rotation  W/c>< mat squat pivot transfer to L with max assist and R with mod assist.  Extra w/c cushion placed in w/c to better position pt for w/c propulsion and transfers.  W/c> bed transfer to R with mod assist; sit> supine with mod assist.  Pt positioned for comfort, bed alarm set and all needs within reach.  Wife present.  Katrina Brosh 08/09/2013, 7:56 AM

## 2013-08-10 ENCOUNTER — Inpatient Hospital Stay (HOSPITAL_COMMUNITY): Payer: Medicare Other | Admitting: Occupational Therapy

## 2013-08-10 ENCOUNTER — Inpatient Hospital Stay (HOSPITAL_COMMUNITY): Payer: Medicare Other | Admitting: *Deleted

## 2013-08-10 ENCOUNTER — Inpatient Hospital Stay (HOSPITAL_COMMUNITY): Payer: Medicare Other

## 2013-08-10 DIAGNOSIS — Z8673 Personal history of transient ischemic attack (TIA), and cerebral infarction without residual deficits: Secondary | ICD-10-CM

## 2013-08-10 DIAGNOSIS — I4891 Unspecified atrial fibrillation: Secondary | ICD-10-CM

## 2013-08-10 DIAGNOSIS — I619 Nontraumatic intracerebral hemorrhage, unspecified: Secondary | ICD-10-CM

## 2013-08-10 DIAGNOSIS — I10 Essential (primary) hypertension: Secondary | ICD-10-CM

## 2013-08-10 MED ORDER — POTASSIUM CHLORIDE CRYS ER 10 MEQ PO TBCR
10.0000 meq | EXTENDED_RELEASE_TABLET | Freq: Every day | ORAL | Status: DC
Start: 2013-08-10 — End: 2013-08-30
  Administered 2013-08-10 – 2013-08-30 (×21): 10 meq via ORAL
  Filled 2013-08-10 (×24): qty 1

## 2013-08-10 NOTE — Progress Notes (Signed)
Subjective/Complaints: 80 y.o. left-handed male with history of atrial fibrillation, hypertension, PFO, CVA 2009 not on anticoagulations and very little residual weakness. Admitted 08/02/2013 with left-sided weakness. Cranial CT scan showed 17 x 21 hemorrhagic infarcts posterior right basal ganglia and progression of microvascular disease. His his systolic blood pressure 829/56 on arrival. Follow cranial CT scan 08/04/2013 with stable hypertensive hemorrhage that's stable and mild vasogenic edema. Patient enrolled in ATACH-II study for BP control. Neurology recommends repeat CCT in 2-3 weeks to decide on antiplatelet therapy  No issues overnite except poor sleep Review of Systems - Negative except Left side weak  Objective: Vital Signs: Blood pressure 125/71, pulse 65, temperature 97.3 F (36.3 C), temperature source Oral, resp. rate 18, height 5' 3" (1.6 m), weight 63.8 kg (140 lb 10.5 oz), SpO2 95.00%. No results found. Results for orders placed during the hospital encounter of 08/07/13 (from the past 72 hour(s))  CBC WITH DIFFERENTIAL     Status: None   Collection Time    08/08/13  9:00 AM      Result Value Range   WBC 8.8  4.0 - 10.5 K/uL   RBC 4.99  4.22 - 5.81 MIL/uL   Hemoglobin 15.2  13.0 - 17.0 g/dL   HCT 43.4  39.0 - 52.0 %   MCV 87.0  78.0 - 100.0 fL   MCH 30.5  26.0 - 34.0 pg   MCHC 35.0  30.0 - 36.0 g/dL   RDW 13.7  11.5 - 15.5 %   Platelets 334  150 - 400 K/uL   Neutrophils Relative % 74  43 - 77 %   Neutro Abs 6.6  1.7 - 7.7 K/uL   Lymphocytes Relative 14  12 - 46 %   Lymphs Abs 1.2  0.7 - 4.0 K/uL   Monocytes Relative 8  3 - 12 %   Monocytes Absolute 0.7  0.1 - 1.0 K/uL   Eosinophils Relative 3  0 - 5 %   Eosinophils Absolute 0.3  0.0 - 0.7 K/uL   Basophils Relative 0  0 - 1 %   Basophils Absolute 0.0  0.0 - 0.1 K/uL  COMPREHENSIVE METABOLIC PANEL     Status: Abnormal   Collection Time    08/08/13  9:00 AM      Result Value Range   Sodium 141  137 - 147 mEq/L    Potassium 3.4 (*) 3.7 - 5.3 mEq/L   Chloride 103  96 - 112 mEq/L   CO2 25  19 - 32 mEq/L   Glucose, Bld 106 (*) 70 - 99 mg/dL   BUN 14  6 - 23 mg/dL   Creatinine, Ser 0.87  0.50 - 1.35 mg/dL   Calcium 8.8  8.4 - 10.5 mg/dL   Total Protein 6.9  6.0 - 8.3 g/dL   Albumin 3.6  3.5 - 5.2 g/dL   AST 17  0 - 37 U/L   ALT 12  0 - 53 U/L   Alkaline Phosphatase 42  39 - 117 U/L   Total Bilirubin 0.8  0.3 - 1.2 mg/dL   GFR calc non Af Amer 81 (*) >90 mL/min   GFR calc Af Amer >90  >90 mL/min   Comment: (NOTE)     The eGFR has been calculated using the CKD EPI equation.     This calculation has not been validated in all clinical situations.     eGFR's persistently <90 mL/min signify possible Chronic Kidney     Disease.  Vitals reviewed.  Constitutional: He is oriented to person, place, and time. No distress  HEENT: oral mucosa moist. Missing a few teeth  Eyes:  Pupils reactive to light  Neck: Normal range of motion. Neck supple. No thyromegaly present.  Cardiovascular:  Rate controlled, irregular rhythm, small sem  Respiratory: Effort normal and breath sounds normal. No respiratory distress. No wheezes or rales  GI: Soft. Bowel sounds are normal. He exhibits no distension.  Neurological: He is alert and oriented to person, place, and time.  Flat affect. Follows commands. Speech is mildly dysarthric but intelligible. Improving oromotor control. . LUE is grossly 2 to 2+ severe ataxia. LLE is better at 3/5 HF, KE and 4- ADF/APF. Absent LT LUE and LLE. Limited insight and awareness.  Skin: Skin is warm and dry.    Assessment/Plan: 1. Functional deficits secondary to Right basal ganglia hemmorrhagic infarct which require 3+ hours per day of interdisciplinary therapy in a comprehensive inpatient rehab setting. Physiatrist is providing close team supervision and 24 hour management of active medical problems listed below. Physiatrist and rehab team continue to assess barriers to  discharge/monitor patient progress toward functional and medical goals. FIM: FIM - Bathing Bathing Steps Patient Completed: Left Arm;Chest;Right upper leg;Left upper leg;Abdomen;Buttocks;Front perineal area;Right lower leg (including foot);Left lower leg (including foot) Bathing: 4: Min-Patient completes 8-9 69f10 parts or 75+ percent  FIM - Upper Body Dressing/Undressing Upper body dressing/undressing steps patient completed: Pull shirt over trunk;Put head through opening of pull over shirt/dress;Thread/unthread right sleeve of pullover shirt/dresss;Thread/unthread right sleeve of front closure shirt/dress Upper body dressing/undressing: 3: Mod-Patient completed 50-74% of tasks FIM - Lower Body Dressing/Undressing Lower body dressing/undressing steps patient completed: Thread/unthread right underwear leg;Thread/unthread left underwear leg;Thread/unthread right pants leg;Thread/unthread left pants leg Lower body dressing/undressing: 2: Max-Patient completed 25-49% of tasks  FIM - Toileting Toileting steps completed by patient: Performs perineal hygiene Toileting: 2: Max-Patient completed 1 of 3 steps  FIM - TRadio producerDevices: Bedside commode;Grab bars Toilet Transfers: 2-To toilet/BSC: Max A (lift and lower assist);2-From toilet/BSC: Max A (lift and lower assist)  FIM - BControl and instrumentation engineerDevices: Arm rests Bed/Chair Transfer: 2: Bed > Chair or W/C: Max A (lift and lower assist);3: Sit > Supine: Mod A (lifting assist/Pt. 50-74%/lift 2 legs);3: Chair or W/C > Bed: Mod A (lift or lower assist)  FIM - Locomotion: Wheelchair Distance: 75 Locomotion: Wheelchair: 2: Travels 50 - 149 ft with minimal assistance (Pt.>75%) FIM - Locomotion: Ambulation Locomotion: Ambulation Assistive Devices: Walker - Rolling;Orthosis Ambulation/Gait Assistance: 1: +1 Total assist Locomotion: Ambulation: 1: Travels less than 50 ft with total  assistance/helper does all (Pt.<25%)  Comprehension Comprehension Mode: Auditory Comprehension: 5-Follows basic conversation/direction: With extra time/assistive device  Expression Expression Mode: Verbal Expression: 5-Expresses basic needs/ideas: With extra time/assistive device  Social Interaction Social Interaction: 4-Interacts appropriately 75 - 89% of the time - Needs redirection for appropriate language or to initiate interaction.  Problem Solving Problem Solving: 5-Solves basic problems: With no assist  Memory Memory: 4-Recognizes or recalls 75 - 89% of the time/requires cueing 10 - 24% of the time   Medical Problem List and Plan:  Hemorrhagic right basal ganglia infarcts  1. DVT Prophylaxis/Anticoagulation: Pharmaceutical: Lovenox  2. Pain Management: N/A  3. Mood: No signs of distress noted. LCSW to follow for evaluation.  4. Neuropsych: This patient is capable of making decisions on his own behalf.  5. HTN: Monitor with qid checks. Continue HCTZ, Avapro and Norvasc. Mild  hypo K, supplement 6. BPH: continue proscar.   LOS (Days) 3 A FACE TO FACE EVALUATION WAS PERFORMED  , E 08/10/2013, 10:18 AM

## 2013-08-10 NOTE — Progress Notes (Signed)
Physical Therapy Session Note  Patient Details  Name: Jack [name] MRN: [id number] Date of Birth: 08-15-1933  Today's Date: 08/10/2013 Time:   10:17-11:14(30min) and 13:00-13:30 (77min)     Short Term Goals: Week 1:  PT Short Term Goal 1 (Week 1): pt will move supine> sit with min assist PT Short Term Goal 2 (Week 1): pt will transfer bed>< w/c with mod asssist PT Short Term Goal 3 (Week 1): pt will propel w/c x 150' with supervision PT Short Term Goal 4 (Week 1): pt will perform gait x 50' with mod assist and LRAD PT Short Term Goal 5 (Week 1): pt will ascend and descend 3 steps 2 rails with mod assist  Skilled Therapeutic Interventions/Progress Updates:  1:2 Tx focused on NMR and transfer training. Wife, [name] present. Pt propelled WC x75' with Min A for steering and cues for hemi-technique. Pt has difficulty with maintaining straight path and needs safety cues for LUE and brakes.  Tranfers from various heights to R/L x5 each with sequence cues: min/Mod A to R and mod/max A to L as pt tends to overshoot to L. L knee needs blocking as well.   Performed the following NMR in sitting and standing for increased midline orientation and L-sided activation:  Sitting: lateral scooting along mat edge, sitting<>lateral elbow lean on R/L, cone taps with LLE Standing at mirror: static stance, lateral leans and returning to midline, head turns, LLE taps to step with bil UE support. Pt needing up to max A for balance in standing with L knee blocked. Supine bridging and heel slides for increased LE control Mod A sit<>supine Pt left up in [facility] with wife present  2:2 Tx focused on NMR in // bars for standing balance, LLE activation, and midline orientation with mirror for visual feedback.  Sit<>stand with Min A, pt cued to look to L side when coming to sit to avoid overshooting.  Static stance and then standing/reaching/placing with RUE x34min with up to Max A for balance and blocking L knee. Pt needing  constant cues to readjust posture and use mirror, which he is able to do. Also performed mini-squats x10 and heel raises for increased control of LLE during functional strengthening tasks. Pt needing cues to control movement as he tends to move quickly.  Gait in // bars x5' with total A for weight shifting, LLE placement and blocking. Pt varies between posterior and forward leaning with limited awareness of midline until cued.  Performed LAQ with hold for increased knee strength and control. Pt propelled WC with same hemi-technique, needing cues for stroke length and timing.  Transferred back to bed with Mod A to R and max A sit>supine via sidelying.  Bed alarm on and all needs in reach.       Therapy Documentation Precautions:  Precautions Precautions: Fall Precaution Comments: Severely impaired LUE/LLE sensation Restrictions Weight Bearing Restrictions: No General:   Vital Signs:   Pain: Pain Assessment Pain Assessment: No/denies pain  See FIM for current functional status  Therapy/Group: Individual Therapy Kennieth Rad, PT, DPT  08/10/2013, 10:48 AM

## 2013-08-10 NOTE — Progress Notes (Signed)
Occupational Therapy Session Note  Patient Details  Name: [name] MRN: [id number] Date of Birth: 17-Jan-1934  Today's Date: 08/10/2013 Time: 7353-2992 Time Calculation (min): 60 min  Short Term Goals: Week 1:  OT Short Term Goal 1 (Week 1): Pt will be able to maintain static standing balance with min A to increase his ability to manage clothing over his hips. OT Short Term Goal 2 (Week 1): Pt will be able to don shirt with supervision. OT Short Term Goal 3 (Week 1): Pt will be able to don pants with mod A. OT Short Term Goal 4 (Week 1): Pt will be able to transfer to toilet with mod A x1. OT Short Term Goal 5 (Week 1): Pt will demonstrate increased functional use of his L hand by holding wash cloth and washing R arm.  Skilled Therapeutic Interventions/Progress Updates:      Pt seen for BADL retraining of toileting, bathing, and dressing with a focus on trunk control, forced use of LUE, midline awareness, and standing. Pt sat to EOB with a focus on engaging his LUE to assist him with pushing up. Pt then worked on trunk control exercises of lateral reaching with trunk lengthening and facilitation of protective responses by having pt quickly self correct prior to falling to side. Pt did extremely well with this and prevented falls to L in sitting. Pt completed toilet and tub bench transfers with max a to facilitate through LLE.  Pt was able to safely lean with contact guard to left to wash R buttock. He donned underwear and pants over feet and pulled them over hips 50% of the way.  He was able to don socks after they were started over his toes. Pt then engaged in dowel exercises to facilitate LUE motor control. He was able to maintain a grip on the bar with pushing bar forward, in circles and with wrist flex/ ext.  Pt did need facilitation through elbow.  Pt resting in w/c at end of session set up with towel glide exercises on table. Pt's wife with patient.  Therapy Documentation Precautions:   Precautions Precautions: Fall Precaution Comments: Severely impaired LUE/LLE sensation Restrictions Weight Bearing Restrictions: No      Pain: Pain Assessment Pain Assessment: No/denies pain ADL:  See FIM for current functional status  Therapy/Group: Individual Therapy  SAGUIER,JULIA 08/10/2013, 11:32 AM

## 2013-08-10 NOTE — Progress Notes (Signed)
Speech Language Pathology Daily Session Note  Patient Details  Name: Jack [name] MRN: [id number] Date of Birth: [birth year]  Today's Date: 08/10/2013 Time: 4010-2725 Time Calculation (min): 40 min  Short Term Goals: Week 1: SLP Short Term Goal 1 (Week 1): Pt will consume regular textures and thin liquids with minimal overt s/s of aspiration, using his swallowing strategies with Mod I SLP Short Term Goal 2 (Week 1): Pt will recall safe swallowing strategies with supervision level cueing SLP Short Term Goal 3 (Week 1): Pt will consume therapeutic trials of thin liquids via straw sips to assess readiness for advancement  Skilled Therapeutic Interventions: Skilled treatment focused on cognitive goals. SLP facilitated session with repositioning for safe PO intake. Pt consumed breakfast meal consisting of regular textures and cup sips of thin liquids with cough noted after initial bite of solid food. No other overt s/s of aspiration were observed, and pt consumed all PO intake in head neutral position. SLP provided education regarding reflux precautions, as pt reported h/o GER with frequent symptoms. Continue plan of care.   FIM:  Comprehension Comprehension Mode: Auditory Comprehension: 5-Follows basic conversation/direction: With extra time/assistive device Expression Expression Mode: Verbal Expression: 5-Expresses basic needs/ideas: With no assist Social Interaction Social Interaction: 4-Interacts appropriately 75 - 89% of the time - Needs redirection for appropriate language or to initiate interaction. Problem Solving Problem Solving: 5-Solves basic problems: With no assist Memory Memory: 4-Recognizes or recalls 75 - 89% of the time/requires cueing 10 - 24% of the time FIM - Eating Eating Activity: 5: Supervision/cues;5: Set-up assist for open containers  Pain Pain Assessment Pain Assessment: No/denies pain  Therapy/Group: Individual Therapy   Germain Osgood, M.A.  CCC-SLP [phone]   Germain Osgood 08/10/2013, 12:46 PM

## 2013-08-11 ENCOUNTER — Inpatient Hospital Stay (HOSPITAL_COMMUNITY): Payer: Medicare Other | Admitting: Physical Therapy

## 2013-08-11 ENCOUNTER — Inpatient Hospital Stay (HOSPITAL_COMMUNITY): Payer: Medicare Other | Admitting: Occupational Therapy

## 2013-08-11 ENCOUNTER — Inpatient Hospital Stay (HOSPITAL_COMMUNITY): Payer: Medicare Other

## 2013-08-11 ENCOUNTER — Inpatient Hospital Stay (HOSPITAL_COMMUNITY): Payer: Medicare Other | Admitting: Speech Pathology

## 2013-08-11 DIAGNOSIS — I10 Essential (primary) hypertension: Secondary | ICD-10-CM

## 2013-08-11 DIAGNOSIS — I619 Nontraumatic intracerebral hemorrhage, unspecified: Secondary | ICD-10-CM

## 2013-08-11 DIAGNOSIS — Z8673 Personal history of transient ischemic attack (TIA), and cerebral infarction without residual deficits: Secondary | ICD-10-CM

## 2013-08-11 DIAGNOSIS — I4891 Unspecified atrial fibrillation: Secondary | ICD-10-CM

## 2013-08-11 NOTE — Progress Notes (Signed)
Patient ID: Jack [name], male   DOB: January 22, 1934, 80 y.o.   MRN: [id number] Jack [name] is a 79 y.o. male April 19, 1934 [id number]  Subjective: No new complaints. No new problems. Slept well. Feeling OK. Wife appreciative of rehab and education as ongoing  Objective: Vital signs in last 24 hours: Temp:  [98 F (36.7 C)-98.6 F (37 C)] 98 F (36.7 C) (01/31 0605) Pulse Rate:  [74-78] 78 (01/31 0605) Resp:  [17-18] 17 (01/31 0605) BP: (130-155)/(68-77) 155/68 mmHg (01/31 0605) SpO2:  [97 %-98 %] 97 % (01/31 0605) Weight change:  Last BM Date: 08/10/13  Intake/Output from previous day: 01/30 0701 - 01/31 0700 In: 240 [P.O.:240] Out: 275 [Urine:275]  Physical Exam General: No apparent distress   Wife at side Lungs: Normal effort. Lungs clear to auscultation, no crackles or wheezes. Cardiovascular: Regular rate and rhythm, no edema Musculoskeletal:  Neurovascularly intact Neurological: No new neurological deficits   Lab Results: BMET    Component Value Date/Time   NA 141 08/08/2013 0900   K 3.4* 08/08/2013 0900   CL 103 08/08/2013 0900   CO2 25 08/08/2013 0900   GLUCOSE 106* 08/08/2013 0900   BUN 14 08/08/2013 0900   CREATININE 0.87 08/08/2013 0900   CREATININE 0.90 06/25/2011 1003   CALCIUM 8.8 08/08/2013 0900   GFRNONAA 81* 08/08/2013 0900   GFRAA >90 08/08/2013 0900   CBC    Component Value Date/Time   WBC 8.8 08/08/2013 0900   RBC 4.99 08/08/2013 0900   HGB 15.2 08/08/2013 0900   HCT 43.4 08/08/2013 0900   PLT 334 08/08/2013 0900   MCV 87.0 08/08/2013 0900   MCH 30.5 08/08/2013 0900   MCHC 35.0 08/08/2013 0900   RDW 13.7 08/08/2013 0900   LYMPHSABS 1.2 08/08/2013 0900   MONOABS 0.7 08/08/2013 0900   EOSABS 0.3 08/08/2013 0900   BASOSABS 0.0 08/08/2013 0900   CBG's (last 3):  No results found for this basename: GLUCAP,  in the last 72 hours LFT's Lab Results  Component Value Date   ALT 12 08/08/2013   AST 17 08/08/2013   ALKPHOS 42 08/08/2013   BILITOT 0.8 08/08/2013     Studies/Results: No results found.  Medications:  I have reviewed the patient's current medications. Scheduled Medications: . amLODipine  5 mg Oral Daily  . enoxaparin (LOVENOX) injection  40 mg Subcutaneous Q24H  . finasteride  5 mg Oral Daily  . irbesartan  150 mg Oral Daily   And  . hydrochlorothiazide  12.5 mg Oral Daily  . pantoprazole  40 mg Oral Daily  . potassium chloride  10 mEq Oral Daily  . 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 DVT Prophylaxis/Anticoagulation: Pharmaceutical: Lovenox  2. Pain Management: N/A  3. Mood: No signs of distress noted. LCSW to follow for evaluation.  4. Neuropsych: This patient is capable of making decisions on his own behalf.  5. HTN: Monitor with qid checks. Continue HCTZ, Avapro and Norvasc. Mild hypo K, supplement  6. BPH: continue proscar.   Length of stay, days: 4    [name], MD 08/11/2013, 8:10 AM

## 2013-08-11 NOTE — Progress Notes (Signed)
Physical Therapy Session Note  Patient Details  Name: Jack [name] MRN: [id number] Date of Birth: 1934-03-23  Today's Date: 08/11/2013 Time: 2353-6144 Time Calculation (min): 39 min  Short Term Goals: Week 1:  PT Short Term Goal 1 (Week 1): pt will move supine> sit with min assist PT Short Term Goal 2 (Week 1): pt will transfer bed>< w/c with mod asssist PT Short Term Goal 3 (Week 1): pt will propel w/c x 150' with supervision PT Short Term Goal 4 (Week 1): pt will perform gait x 50' with mod assist and LRAD PT Short Term Goal 5 (Week 1): pt will ascend and descend 3 steps 2 rails with mod assist  Skilled Therapeutic Interventions/Progress Updates:    Pt received semi-reclined in bed with wife present; agreeable to therapy. Session focused on bed mobility, functional transfers, standing balance. Pt performed initial supine>sit with mod A, bed rails. W/c mobility x100' in controlled and home environments using R hemi-technique with min A for LUE management, attention.  In apartment, performed squat pivot from w/c<>couch with min A to R side, mod A to L side. Multiple supine<>sit transfers in apartment bed multimodal cueing for technique with focus on using RUE to initiate L rolling and on lifting LLE using RLE.  In gym, stand-pivot from w/c<>mat with mod A and manual stabilization of L knee. Performed sit<>stand transfers from mat table with mod A. Once in standing, pt demonstrates limited L knee control; when L knee manually stabilized, pt with limited L hip extension. Donned L AFO (blue rocker) and heel lift to maintain L knee control, enable focus on standing posture, balance. Static standing with RUE support at parallel bars, min A, effective L knee control, improved postural alignment (bilat hip extension). Dynamic standing, pre-gait activities (focus on weight shifting) with RUE support and mod-max A. Transported pt to room in w/c secondary to pt fatigue. Therapist departed with pt seated in  w/c with wife present and all needs within reach.   Therapy Documentation Precautions:  Precautions Precautions: Fall Precaution Comments: Severely impaired LUE/LLE sensation Restrictions Weight Bearing Restrictions: No Vital Signs: Therapy Vitals Temp: 98.4 F (36.9 C) Temp src: Oral Pulse Rate: 79 Resp: 18 BP: 126/78 mmHg Patient Position, if appropriate: Lying Oxygen Therapy SpO2: 96 % O2 Device: None (Room air) Pain: Pain Assessment Pain Assessment: No/denies pain Pain Score: 0-No pain Locomotion : Wheelchair Mobility Distance: 100   See FIM for current functional status  Therapy/Group: Individual Therapy  Inaki Vantine, Malva Cogan 08/11/2013, 4:57 PM

## 2013-08-11 NOTE — Progress Notes (Signed)
Speech Language Pathology Daily Session Note  Patient Details  Name: Jack [name] MRN: [id number] Date of Birth: 07/17/1933  Today's Date: 08/11/2013 Time: 8338-2505 Time Calculation (min): 26 min  Short Term Goals: Week 1: SLP Short Term Goal 1 (Week 1): Pt will consume regular textures and thin liquids with minimal overt s/s of aspiration, using his swallowing strategies with Mod I SLP Short Term Goal 2 (Week 1): Pt will recall safe swallowing strategies with supervision level cueing SLP Short Term Goal 3 (Week 1): Pt will consume therapeutic trials of thin liquids via straw sips to assess readiness for advancement  Skilled Therapeutic Interventions: Skilled ST intervention provided with focus on dysphagia goals. Pt seen in therapy room, seated upright in w/c. Slp reviewed safe swallow strategies and appropriate posture for PO intake. Slp trialed thin liquids by straw. Pt noted with no overt s/s of aspiration with thin liquids via straw and cup. Slp also trialed large sips via straw with no difficulties noted. Pt noted with clear vocal quality throughout POs. Wife present and reports pt to have healthy appetite and no difficulties with swallowing during meals this date.    FIM:  Comprehension Comprehension Mode: Auditory Comprehension: 5-Follows basic conversation/direction: With extra time/assistive device Expression Expression Mode: Verbal Expression: 5-Expresses basic needs/ideas: With no assist Social Interaction Social Interaction: 4-Interacts appropriately 75 - 89% of the time - Needs redirection for appropriate language or to initiate interaction. Problem Solving Problem Solving: 5-Solves basic 90% of the time/requires cueing < 10% of the time Memory Memory: 4-Recognizes or recalls 75 - 89% of the time/requires cueing 10 - 24% of the time FIM - Eating Eating Activity: 5: Supervision/cues  Pain Pain Assessment Pain Assessment: No/denies pain  Therapy/Group: Individual  Therapy  Saidee Geremia, Bernerd Pho 08/11/2013, 3:36 PM

## 2013-08-11 NOTE — Progress Notes (Signed)
Occupational Therapy Session Note  Patient Details  Name: Jack [name] MRN: [id number] Date of Birth: [birth year]  Today's Date: 08/11/2013 Time: 1100-1200 and 1415-1500 Time Calculation (min): 60 min and 45 min  Short Term Goals: Week 1:  OT Short Term Goal 1 (Week 1): Pt will be able to maintain static standing balance with min A to increase his ability to manage clothing over his hips. OT Short Term Goal 2 (Week 1): Pt will be able to don shirt with supervision. OT Short Term Goal 3 (Week 1): Pt will be able to don pants with mod A. OT Short Term Goal 4 (Week 1): Pt will be able to transfer to toilet with mod A x1. OT Short Term Goal 5 (Week 1): Pt will demonstrate increased functional use of his L hand by holding wash cloth and washing R arm.  Skilled Therapeutic Interventions/Progress Updates:    Visit 1:  No c/o pain.   Pt seen for BADL retraining of toileting, bathing, and dressing with a focus on trunk control, sit to stand, standing, and LUE motor control. Pt was able to use arm with max physical cues to push up from sidelying to sit with min A.  He completed toilet and tub bench transfers with max A and facilitation of LLE.  He also worked on maintaining static standing focusing on trying to have proprioceptive awareness through LLE. To assist him with this, pt completed multiple reps of small knee bends with BLE at the same time and isolated with LLE. Pt completed grooming in standing with mod-max to steady balance.  Pt resting in w/c at end of session with call light in reach.  Visit 2: No c/o pain.  Pt seen this session to focus on neuromuscular re-ed of LUE and postural control. Pt worked on squat pivot transfers w/c to mat with bobath techniques 8x with only min to mod A to guide his torso. Sat on therapy mat for 40 min while engaging in LUE weight bearing, dynamic reaching with trunk rotation, resisted elbow extension with pushing up, active co-contractions with squeezing large ball  and lifting arms up to chest level,  guided active ROM with forward reaching and maintaining grasp and grasping and releasing cones. Pt participated extremely well and began to demonstrate improvement with L grasp control and sensory awareness of his UE movement.  Pt transferred back to w/c and was taken to speech therapy.  Therapy Documentation Precautions:  Precautions Precautions: Fall Precaution Comments: Severely impaired LUE/LLE sensation Restrictions Weight Bearing Restrictions: No   ADL:  See FIM for current functional status  Therapy/Group: Individual Therapy  Annjanette Wertenberger 08/11/2013, 12:31 PM

## 2013-08-12 NOTE — Progress Notes (Addendum)
Patient ID: Jack [name], male   DOB: [birth year], 80 y.o.   MRN: [id number] Jack [name] is a 80 y.o. male [birth year] [id number]  Subjective: Slept well and feels well this AM -   Objective: Vital signs in last 24 hours: Temp:  [98.2 F (36.8 C)-98.4 F (36.9 C)] 98.2 F (36.8 C) (02/01 0532) Pulse Rate:  [79-81] 81 (02/01 0532) Resp:  [18-19] 19 (02/01 0532) BP: (123-126)/(78-82) 123/82 mmHg (02/01 0532) SpO2:  [96 %-97 %] 97 % (02/01 0532) Weight change:  Last BM Date: 08/11/13  Intake/Output from previous day: 01/31 0701 - 02/01 0700 In: 840 [P.O.:840] Out: 375 [Urine:375]  Physical Exam General: No apparent distress   Lungs: Normal effort. Lungs clear to auscultation, no crackles or wheezes. Cardiovascular: Regular rate and rhythm, no edema Musculoskeletal:  neurovasc intact Neurological: No new neurological deficits   Lab Results: BMET    Component Value Date/Time   NA 141 08/08/2013 0900   K 3.4* 08/08/2013 0900   CL 103 08/08/2013 0900   CO2 25 08/08/2013 0900   GLUCOSE 106* 08/08/2013 0900   BUN 14 08/08/2013 0900   CREATININE 0.87 08/08/2013 0900   CREATININE 0.90 06/25/2011 1003   CALCIUM 8.8 08/08/2013 0900   GFRNONAA 81* 08/08/2013 0900   GFRAA >90 08/08/2013 0900   CBC    Component Value Date/Time   WBC 8.8 08/08/2013 0900   RBC 4.99 08/08/2013 0900   HGB 15.2 08/08/2013 0900   HCT 43.4 08/08/2013 0900   PLT 334 08/08/2013 0900   MCV 87.0 08/08/2013 0900   MCH 30.5 08/08/2013 0900   MCHC 35.0 08/08/2013 0900   RDW 13.7 08/08/2013 0900   LYMPHSABS 1.2 08/08/2013 0900   MONOABS 0.7 08/08/2013 0900   EOSABS 0.3 08/08/2013 0900   BASOSABS 0.0 08/08/2013 0900   CBG's (last 3):  No results found for this basename: GLUCAP,  in the last 72 hours LFT's Lab Results  Component Value Date   ALT 12 08/08/2013   AST 17 08/08/2013   ALKPHOS 42 08/08/2013   BILITOT 0.8 08/08/2013    Studies/Results: No results found.  Medications:  I have reviewed the patient's current  medications. Scheduled Medications: . amLODipine  5 mg Oral Daily  . enoxaparin (LOVENOX) injection  40 mg Subcutaneous Q24H  . finasteride  5 mg Oral Daily  . irbesartan  150 mg Oral Daily   And  . hydrochlorothiazide  12.5 mg Oral Daily  . pantoprazole  40 mg Oral Daily  . potassium chloride  10 mEq Oral Daily  . 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 DVT Prophylaxis/Anticoagulation: Pharmaceutical: Lovenox  2. Pain Management: NA  3. Mood: No signs of distress noted. LCSW to follow for evaluation.  4. Neuropsych: This patient is capable of making decisions on his own behalf.  5. HTN: Monitor with qid checks. Continue HCTZ, Avapro and Norvasc. Mild hypo K, supplement  6. BPH: continue proscar.   Length of stay, days: 5    Jalyiah Shelley A. Asa Lente, MD 08/12/2013, 9:26 AM

## 2013-08-13 ENCOUNTER — Inpatient Hospital Stay (HOSPITAL_COMMUNITY): Payer: Medicare Other

## 2013-08-13 ENCOUNTER — Inpatient Hospital Stay (HOSPITAL_COMMUNITY): Payer: Medicare Other | Admitting: Occupational Therapy

## 2013-08-13 DIAGNOSIS — I10 Essential (primary) hypertension: Secondary | ICD-10-CM

## 2013-08-13 DIAGNOSIS — I619 Nontraumatic intracerebral hemorrhage, unspecified: Secondary | ICD-10-CM

## 2013-08-13 DIAGNOSIS — Z8673 Personal history of transient ischemic attack (TIA), and cerebral infarction without residual deficits: Secondary | ICD-10-CM

## 2013-08-13 DIAGNOSIS — I4891 Unspecified atrial fibrillation: Secondary | ICD-10-CM

## 2013-08-13 NOTE — Progress Notes (Signed)
Speech Language Pathology Discharge Summary & Final Treatment Notes  Patient Details  Name: Jack [name] MRN: [id number] Date of Birth: Jun 16, 1934  Today's Date: 08/13/2013 Time #1: 0830-0900 Time Calculation (min): 30 min  Time #2: 7395-8441 Time Calculation (min): 30 min  Skilled Therapeutic Interventions: Session #1: Skilled treatment focused on swallowing goals. SLP facilitated session with skilled observation of thin liquid trials via straw sips. No overt s/s of aspiration were observed. Pt utilized safe swallowing strategies with Mod I, with no overt s/s of aspiration observed. Pt recalled safe swallowing strategies with Mod I.  Session #2: Skilled treatment focused on education, with wife present for session. SLP facilitated session with education regarding current level of functioning and recommendations for d/c as patient is at goal level. Pt consumed trials of regular textures and thin liquids via straw with Mod I, with no overt s/s of aspiration observed. Pt initially showed hesitation to being discharged from SLP services, however with discussion about current level of functioning, pt verbalized his understanding of the information provided and his agreement. Pt's wife in agreement as well. Pt is safely tolerating an unrestricted diet, and therefore does no require additional SLP f/u for swallowing treatment.   Patient has met 1 of 1 long term goals.  Patient to discharge at overall Modified Independent level.  Reasons goals not met: N/A   Clinical Impression/Discharge Summary: Pt has met 1 out of 1 LTG this admission, demonstrating functional gains in oropharyngeal swallowing. Pt is now tolerating a regular diet and thin liquids without restrictions, and is utilizing his strategies with Mod I. Pt continues to present with decreased recall of new information, initiation, and problem solving, however pt and his family report that this is his baseline level of functioning. Education  has been completed with patient and family. Pt has met his goal, with no further SLP f/u recommended at this time; pt and family are in agreement.    Care Partner: N/A      Recommendation:  None      Equipment: None recommended by SLP   Reasons for discharge: Treatment goals met   Patient/Family Agrees with Progress Made and Goals Achieved: Yes   See FIM for current functional status   Germain Osgood, M.A. CCC-SLP [phone]   Germain Osgood 08/13/2013, 4:37 PM

## 2013-08-13 NOTE — Progress Notes (Signed)
Physical Therapy Note  Patient Details  Name: Jack [name] MRN: [id number] Date of Birth: Dec 26, 1933 Today's Date: 08/13/2013 1330-1415 45 min individual therapy No pain reported  tx focused on neuromuscular re-education via forced use, demo, VCS and visual feedback for LLE stance control, L hand gross assist during bil UE task, LUE,LE, and trunk activation during bed mobility.  Also, neuromuscular re-education during bil hip adduction in hooklying, bil bridging with bil hip adduction for core activation, modified abdominal crunches, rolling L and R each x 5.  Wt shifting in standing x 2 minutes; up/down 3 steps (5" high) forwards ascending and backwards descending, step to method, mod assist for LLE control, LUE control and sequencing.  Pt tends to overcompensate for L sided weakness by speedy and ballistic motions, which are unsafe at this time; he was able to slow down and work on control with mod VCs throughout session. Ludmilla Mcgillis 08/13/2013, 2:06 PM

## 2013-08-13 NOTE — Progress Notes (Signed)
Occupational Therapy Session Note  Patient Details  Name: Jack [name] MRN: [id number] Date of Birth: Sep 03, 1933  Today's Date: 08/13/2013 Time: 0900-1000 and 1107 -1140 Time Calculation (min): 60 min and 33 min  Short Term Goals: Week 1:  OT Short Term Goal 1 (Week 1): Pt will be able to maintain static standing balance with min A to increase his ability to manage clothing over his hips. OT Short Term Goal 2 (Week 1): Pt will be able to don shirt with supervision. OT Short Term Goal 3 (Week 1): Pt will be able to don pants with mod A. OT Short Term Goal 4 (Week 1): Pt will be able to transfer to toilet with mod A x1. OT Short Term Goal 5 (Week 1): Pt will demonstrate increased functional use of his L hand by holding wash cloth and washing R arm.  Skilled Therapeutic Interventions/Progress Updates:    Visit 1:   Pt seen for BADL retraining of toileting, bathing, and dressing with a focus on trunk control, use of LUE, and adaptive dressing techniques. Pt's wife was present and involved during the session to learn specific transfer techniques to toilet and tub bench. Pt progressed with bathing to only needing steadying A and cues. He used his L arm to wash his R and the tops of his thighs. His transfers were smoother today, but continues to demonstrate strong L neglect.  Pt was able to self correct his balance when he was cued that he was leaning to far to the left.  To complete grooming, pt stood in [place],  Pt resting in chair at end of session in his room with call light in reach.  Visit 2: Pt seen this session for neuro re-ed focusing on trunk control and LUE motor control.  Pt worked on multiple squat pivot transfers from chair to mat, improving with each transfer in his awareness of his trunk positioning. By the last transfer to the w/c pt was able to do it with very close Supervision. In sitting at EOM, LUE AROM with bilateral gripping on dowel bar for sh exercises and washing a mirror with a  cloth. Pt can now raise his arm to 120 with increased control, but continues to have difficulty maintaining a grasp. Pt returned to his room with his wife.   Therapy Documentation Precautions:  Precautions Precautions: Fall Precaution Comments: Severely impaired LUE/LLE sensation Restrictions Weight Bearing Restrictions: No    Pain: No c/o in both sessions. Pain Assessment Pain Assessment: No/denies pain Pain Score: 0-No pain ADL:  See FIM for current functional status  Therapy/Group: Individual Therapy  SAGUIER,JULIA 08/13/2013, 12:02 PM

## 2013-08-13 NOTE — Progress Notes (Signed)
Subjective/Complaints: 80 y.o. left-handed male with history of atrial fibrillation, hypertension, PFO, CVA 2009 not on anticoagulations and very little residual weakness. Admitted 08/02/2013 with left-sided weakness. Cranial CT scan showed 17 x 21 hemorrhagic infarcts posterior right basal ganglia and progression of microvascular disease. His his systolic blood pressure 353/29 on arrival. Follow cranial CT scan 08/04/2013 with stable hypertensive hemorrhage that's stable and mild vasogenic edema. Patient enrolled in ATACH-II study for BP control. Neurology recommends repeat CCT in 2-3 weeks to decide on antiplatelet therapy   No issues Follows commands Review of Systems - Negative except Left side weak  Objective: Vital Signs: Blood pressure 148/70, pulse 92, temperature 98.3 F (36.8 C), temperature source Oral, resp. rate 19, height 5\' 3"  (1.6 m), weight 63.8 kg (140 lb 10.5 oz), SpO2 95.00%. No results found. No results found for this or any previous visit (from the past 72 hour(s)).    Vitals reviewed.  Constitutional: He is oriented to person, place, and time. No distress  HEENT: oral mucosa moist. Missing a few teeth  Eyes:  Pupils reactive to light  Neck: Normal range of motion. Neck supple. No thyromegaly present.  Cardiovascular:  Rate controlled, irregular rhythm, small sem  Respiratory: Effort normal and breath sounds normal. No respiratory distress. No wheezes or rales  GI: Soft. Bowel sounds are normal. He exhibits no distension.  Neurological: He is alert and oriented to person, place, and time.  Flat affect. Follows commands. Speech is mildly dysarthric but intelligible. Improving oromotor control. . LUE is grossly 3+ severe ataxia. LLE is better at 3+/5 HF, KE and 4- ADF/APF. Absent LT LUE and LLE. Limited insight and awareness.  Skin: Skin is warm and dry.    Assessment/Plan: 1. Functional deficits secondary to Right basal ganglia hemmorrhagic infarct which require  3+ hours per day of interdisciplinary therapy in a comprehensive inpatient rehab setting. Physiatrist is providing close team supervision and 24 hour management of active medical problems listed below. Physiatrist and rehab team continue to assess barriers to discharge/monitor patient progress toward functional and medical goals. FIM: FIM - Bathing Bathing Steps Patient Completed: Left Arm;Chest;Right upper leg;Left upper leg;Abdomen;Front perineal area;Right lower leg (including foot);Left lower leg (including foot) Bathing: 4: Min-Patient completes 8-9 73f 10 parts or 75+ percent  FIM - Upper Body Dressing/Undressing Upper body dressing/undressing steps patient completed: Thread/unthread left sleeve of front closure shirt/dress;Thread/unthread right sleeve of front closure shirt/dress;Thread/unthread right sleeve of pullover shirt/dresss;Thread/unthread left sleeve of pullover shirt/dress;Put head through opening of pull over shirt/dress;Pull shirt over trunk Upper body dressing/undressing: 4: Min-Patient completed 75 plus % of tasks FIM - Lower Body Dressing/Undressing Lower body dressing/undressing steps patient completed: Thread/unthread right underwear leg;Thread/unthread left underwear leg;Thread/unthread right pants leg;Thread/unthread left pants leg;Don/Doff left shoe Lower body dressing/undressing: 2: Max-Patient completed 25-49% of tasks  FIM - Toileting Toileting steps completed by patient: Performs perineal hygiene Toileting Assistive Devices: Grab bar or rail for support Toileting: 1: Total-Patient completed zero steps, helper did all 3  FIM - Radio producer Devices: Elevated toilet seat;Grab bars Toilet Transfers: 2-From toilet/BSC: Max A (lift and lower assist);2-To toilet/BSC: Max A (lift and lower assist)  FIM - Engineer, site Assistive Devices: Arm rests;Bed rails Bed/Chair Transfer: 3: Supine > Sit: Mod A (lifting  assist/Pt. 50-74%/lift 2 legs;4: Sit > Supine: Min A (steadying pt. > 75%/lift 1 leg);3: Bed > Chair or W/C: Mod A (lift or lower assist);3: Chair or W/C > Bed: Mod A (lift or lower  assist)  FIM - Locomotion: Wheelchair Distance: 100 Locomotion: Wheelchair: 2: Travels 50 - 149 ft with minimal assistance (Pt.>75%) FIM - Locomotion: Ambulation Locomotion: Ambulation Assistive Devices: Parallel bars Ambulation/Gait Assistance: 1: +1 Total assist Locomotion: Ambulation: 0: Activity did not occur  Comprehension Comprehension Mode: Auditory Comprehension: 5-Follows basic conversation/direction: With extra time/assistive device  Expression Expression Mode: Verbal Expression: 5-Expresses basic needs/ideas: With no assist  Social Interaction Social Interaction: 4-Interacts appropriately 75 - 89% of the time - Needs redirection for appropriate language or to initiate interaction.  Problem Solving Problem Solving: 5-Solves basic 90% of the time/requires cueing < 10% of the time  Memory Memory: 4-Recognizes or recalls 75 - 89% of the time/requires cueing 10 - 24% of the time   Medical Problem List and Plan:  Hemorrhagic right basal ganglia infarcts  1. DVT Prophylaxis/Anticoagulation: Pharmaceutical: Lovenox  2. Pain Management: N/A  3. Mood: No signs of distress noted. LCSW to follow for evaluation.  4. Neuropsych: This patient is capable of making decisions on his own behalf.  5. HTN: Monitor with qid checks. Continue HCTZ, Avapro and Norvasc. Mild hypo K, supplement 6. BPH: continue proscar.   LOS (Days) 6 A FACE TO FACE EVALUATION WAS PERFORMED  Llesenia Fogal E 08/13/2013, 8:54 AM

## 2013-08-14 ENCOUNTER — Inpatient Hospital Stay (HOSPITAL_COMMUNITY): Payer: Medicare Other | Admitting: Occupational Therapy

## 2013-08-14 ENCOUNTER — Inpatient Hospital Stay (HOSPITAL_COMMUNITY): Payer: Medicare Other | Admitting: *Deleted

## 2013-08-14 LAB — BASIC METABOLIC PANEL
BUN: 19 mg/dL (ref 6–23)
CHLORIDE: 103 meq/L (ref 96–112)
CO2: 29 mEq/L (ref 19–32)
Calcium: 8.9 mg/dL (ref 8.4–10.5)
Creatinine, Ser: 0.87 mg/dL (ref 0.50–1.35)
GFR calc non Af Amer: 81 mL/min — ABNORMAL LOW (ref >90)
Glucose, Bld: 97 mg/dL (ref 70–99)
Potassium: 3.8 mEq/L (ref 3.7–5.3)
Sodium: 144 mEq/L (ref 137–147)

## 2013-08-14 LAB — CREATININE, SERUM
Creatinine, Ser: 0.88 mg/dL (ref 0.50–1.35)
GFR calc Af Amer: >90 mL/min (ref >90)
GFR calc non Af Amer: 81 mL/min — ABNORMAL LOW (ref >90)

## 2013-08-14 NOTE — Progress Notes (Signed)
Subjective/Complaints: 80 y.o. left-handed male with history of atrial fibrillation, hypertension, PFO, CVA 2009 not on anticoagulations and very little residual weakness. Admitted 08/02/2013 with left-sided weakness. Cranial CT scan showed 17 x 21 hemorrhagic infarcts posterior right basal ganglia and progression of microvascular disease. His his systolic blood pressure 008/67 on arrival. Follow cranial CT scan 08/04/2013 with stable hypertensive hemorrhage that's stable and mild vasogenic edema. Patient enrolled in ATACH-II study for BP control. Neurology recommends repeat CCT in 2-3 weeks to decide on antiplatelet therapy  Oriented to person and place Review of Systems - Negative except Left side weak  Objective: Vital Signs: Blood pressure 158/72, pulse 70, temperature 97.9 F (36.6 C), temperature source Oral, resp. rate 18, height _0  (1.6 m), weight 63.8 kg (140 lb 10.5 oz), SpO2 96.00%. No results found. Results for orders placed during the hospital encounter of 08/07/13 (from the past 72 hour(s))  CREATININE, SERUM     Status: Abnormal   Collection Time    08/14/13  6:40 AM      Result Value Range   Creatinine, Ser 0.88  0.50 - 1.35 mg/dL   GFR calc non Af Amer 81 (*) >90 mL/min   GFR calc Af Amer >90  >90 mL/min   Comment: (NOTE)     The eGFR has been calculated using the CKD EPI equation.     This calculation has not been validated in all clinical situations.     eGFR's persistently <90 mL/min signify possible Chronic Kidney     Disease.      Vitals reviewed.  Constitutional: He is oriented to person, place, and time. No distress  HEENT: oral mucosa moist. Missing a few teeth  Eyes:  Pupils reactive to light  Neck: Normal range of motion. Neck supple. No thyromegaly present.  Cardiovascular:  Rate controlled, irregular rhythm, small sem  Respiratory: Effort normal and breath sounds normal. No respiratory distress. No wheezes or rales  GI: Soft. Bowel sounds are  normal. He exhibits no distension.  Neurological: He is alert and oriented to person, place, and time.  Flat affect. Follows commands. Speech is mildly dysarthric but intelligible. Improving oromotor control. . LUE is grossly 3+ severe ataxia. LLE is better at 3+/5 HF, KE and 4- ADF/APF. Absent LT LUE and LLE. Limited insight and awareness.  Skin: Skin is warm and dry.    Assessment/Plan: 1. Functional deficits secondary to Right basal ganglia hemmorrhagic infarct which require 3+ hours per day of interdisciplinary therapy in a comprehensive inpatient rehab setting. Physiatrist is providing close team supervision and 24 hour management of active medical problems listed below. Physiatrist and rehab team continue to assess barriers to discharge/monitor patient progress toward functional and medical goals. FIM: FIM - Bathing Bathing Steps Patient Completed: Left Arm;Chest;Right upper leg;Left upper leg;Abdomen;Front perineal area;Right lower leg (including foot);Left lower leg (including foot);Right Arm;Buttocks Bathing: 4: Steadying assist  FIM - Upper Body Dressing/Undressing Upper body dressing/undressing steps patient completed: Thread/unthread left sleeve of front closure shirt/dress;Thread/unthread right sleeve of front closure shirt/dress;Thread/unthread right sleeve of pullover shirt/dresss;Thread/unthread left sleeve of pullover shirt/dress;Put head through opening of pull over shirt/dress;Pull shirt over trunk Upper body dressing/undressing: 4: Min-Patient completed 75 plus % of tasks FIM - Lower Body Dressing/Undressing Lower body dressing/undressing steps patient completed: Thread/unthread right underwear leg;Thread/unthread left underwear leg;Thread/unthread right pants leg;Thread/unthread left pants leg;Don/Doff right shoe Lower body dressing/undressing: 2: Max-Patient completed 25-49% of tasks  FIM - Toileting Toileting steps completed by patient: Performs perineal  hygiene Toileting  Assistive Devices: Grab bar or rail for support Toileting: 2: Max-Patient completed 1 of 3 steps  FIM - Radio producer Devices: Elevated toilet seat;Grab bars Toilet Transfers: 3-From toilet/BSC: Mod A (lift or lower assist);3-To toilet/BSC: Mod A (lift or lower assist)  FIM - Engineer, site Assistive Devices: Arm rests Bed/Chair Transfer: 3: Chair or W/C > Bed: Mod A (lift or lower assist);2: Chair or W/C > Bed: Max A (lift and lower assist);4: Supine > Sit: Min A (steadying Pt. > 75%/lift 1 leg);5: Sit > Supine: Supervision (verbal cues/safety issues)  FIM - Locomotion: Wheelchair Distance: 100 Locomotion: Wheelchair: 1: Total Assistance/staff pushes wheelchair (Pt<25%) FIM - Locomotion: Ambulation Locomotion: Ambulation Assistive Devices: Parallel bars Ambulation/Gait Assistance: 1: +1 Total assist Locomotion: Ambulation: 0: Activity did not occur  Comprehension Comprehension Mode: Auditory Comprehension: 5-Follows basic conversation/direction: With extra time/assistive device  Expression Expression Mode: Verbal Expression: 5-Expresses basic needs/ideas: With no assist  Social Interaction Social Interaction: 4-Interacts appropriately 75 - 89% of the time - Needs redirection for appropriate language or to initiate interaction.  Problem Solving Problem Solving: 5-Solves basic 90% of the time/requires cueing < 10% of the time  Memory Memory: 5-Recognizes or recalls 90% of the time/requires cueing < 10% of the time   Medical Problem List and Plan:  Hemorrhagic right basal ganglia infarcts  1. DVT Prophylaxis/Anticoagulation: Pharmaceutical: Lovenox  2. Pain Management: N/A  3. Mood: No signs of distress noted. LCSW to follow for evaluation.  4. Neuropsych: This patient is capable of making decisions on his own behalf.  5. HTN: Monitor with qid checks. Continue HCTZ, Avapro and Norvasc. Mild hypo K,  supplement 6. BPH: continue proscar.   LOS (Days) 7 A FACE TO FACE EVALUATION WAS PERFORMED  KIRSTEINS,ANDREW E 08/14/2013, 8:20 AM

## 2013-08-14 NOTE — Progress Notes (Signed)
Physical Therapy Weekly Progress Note  Patient Details  Name: DETROIT FRIEDEN MRN: [id number] Date of Birth: 1933/07/21  Today's Date: 08/14/2013 Time: 1305-1405 and 14:50-15:35 (45mn)  Time Calculation (min): 60 min  Patient has met 4 of 5 short term goals.  Pt is making good gains in safety awareness, motor control, and coordination, but still requires assistance for safety due to remaining impairments with L-sided control and lack of sensation on L.   Patient continues to demonstrate the following deficits: decreased sensation, generalized weakness, recall of new information, and impaired timing and sequencing, and therefore will continue to benefit from skilled PT intervention to enhance overall performance with activity tolerance, balance, postural control, ability to compensate for deficits, functional use of  left upper extremity and left lower extremity and coordination.  Patient progressing toward long term goals..  Continue plan of care.  PT Short Term Goals Week 1:  PT Short Term Goal 1 (Week 1): pt will move supine> sit with min assist PT Short Term Goal 1 - Progress (Week 1): Met PT Short Term Goal 2 (Week 1): pt will transfer bed>< w/c with mod asssist PT Short Term Goal 2 - Progress (Week 1): Met PT Short Term Goal 3 (Week 1): pt will propel w/c x 150' with supervision PT Short Term Goal 3 - Progress (Week 1): Met PT Short Term Goal 4 (Week 1): pt will perform gait x 50' with mod assist and LRAD PT Short Term Goal 4 - Progress (Week 1): Progressing toward goal PT Short Term Goal 5 (Week 1): pt will ascend and descend 3 steps 2 rails with mod assist PT Short Term Goal 5 - Progress (Week 1): Met Week 2:  PT Short Term Goal 1 (Week 2): Pt will perform bed<>chair transfers with Min A PT Short Term Goal 2 (Week 2): Pt will ambulate 100' with Min A and LRAD PT Short Term Goal 3 (Week 2): Pt will ascend/descend 6 step swith Min A PT Short Term Goal 4 (Week 2): Pt will demonstrate  dynamic standing balance with Min A during functional task PT Short Term Goal 5 (Week 2): Pt will perform bed mobiltiy with S  Skilled Therapeutic Interventions/Progress Updates:  1:2 Tx focused on NMR, functional mobility, WC propulsion, and gait training with RW and hand rail in hall. With minor WC adjustments, pt able to propel with better efficiency, x150' in controlled setting with S and min cues for LUE management.  Performed NMR tasks in // bars for LLE control, ext activation, and midline orientation with mirror for visual feed back. Performed mini-squats and heel raises x10 with assist for L knee blocking and LUE support. Also perofrmed L/R step-taps to 4" step with up to Max A for steadying.   Gait with RW and hand splint x25' with +2 assist for trunk control, LLE control, and RW management.  Gait in hall with Max A - LUE over therapist shoulder and RUE on hand rail - 3x30' with mirror and cues for step length R/L, hip/knee ext on R, and weight shifting. Pt made great progress between trials and was able to recall cues between trials.   2:2 Tx focused on functional mobility training, WC propulsion, and NMR. [facility] x[facility]' with S, limited due to fatigue.   Instructed pt in squat-pivot transfers x8 to R/L with demonstration, sequence cues (1-scoot, 2-set feet, 3-lift and turn, control descent) as well as LUE management/safety. Max>>Mod A to L, mod>>min A to R. Pt  unable to recall sequence after many trials.  NMR provided throughout with verbal and tactile cues as well as manual facilitation for sufficient anterior translation for safe transfer.   Seated rest with L step-tap to target for motor control.   Performed NMR and pre-gait training at hand rail with mirror for step-taps to 2" step with target for L hip ABD x10 R/L with mod A overall and cues for weight-shifting.   Gait x30' as above with Mod A for LLE placement 75% of the time and steadying. Pt able to continue  sequence and L hip/knee ext at proper time with only min cues.   Pt wanting to rest, returned to bed with Min A and max cues. Call bell in reach, bed alarm on, and wife present.       Therapy Documentation Precautions:  Precautions Precautions: Fall Precaution Comments: Severely impaired LUE/LLE sensation Restrictions Weight Bearing Restrictions: No    Pain: none      Locomotion : Ambulation Ambulation/Gait Assistance: 2: Max Financial controller Distance: 150   See FIM for current functional status  Therapy/Group: Individual Therapy  Kennieth Rad, PT, DPT  08/14/2013, 2:17 PM

## 2013-08-14 NOTE — Progress Notes (Signed)
Occupational Therapy Session Note  Patient Details  Name: Jack [name] MRN: [id number] Date of Birth: 1933-10-27  Today's Date: 08/14/2013 Time: 0905-1000 and 1100-1130 Time Calculation (min): 55 min and 30 min  Short Term Goals: Week 1:  OT Short Term Goal 1 (Week 1): Pt will be able to maintain static standing balance with min A to increase his ability to manage clothing over his hips. OT Short Term Goal 2 (Week 1): Pt will be able to don shirt with supervision. OT Short Term Goal 3 (Week 1): Pt will be able to don pants with mod A. OT Short Term Goal 4 (Week 1): Pt will be able to transfer to toilet with mod A x1. OT Short Term Goal 5 (Week 1): Pt will demonstrate increased functional use of his L hand by holding wash cloth and washing R arm.  Skilled Therapeutic Interventions/Progress Updates:    Visit 1:  No c/o pain.    Pt seen for BADL retraining of toileting, bathing at shower level, and dressing with a focus on trunk control, use of LUE and pivot transfers. Pt demonstrated a great deal of progress with pushing self in sidelying to sit and  leaning forward to shift his hips in chair and onto tub bench therefore his transfers were only min a today. He was able to grip with L hand more for bathing.  He also is recalling steps for dressing, ie putting L side of body in clothing first. Pt resting in chair with his wife in the room.  Visit 2: No c/o pain. Pt seen this session for family ed with wife on tub bench transfers in tub room. Pt did very well using his forward lean to transfer with only min A. Pt was then seen in gym to work on LUE motor control with stacking cups, reaching laterally focusing on trunk elongation. Pt was responding well to therapy demonstrating increased grasp and finger extension control.   Pt provided with foam grip strengthening.  Pt's wife took him back to the room.  Therapy Documentation Precautions:  Precautions Precautions: Fall Precaution Comments: Severely  impaired LUE/LLE sensation Restrictions Weight Bearing Restrictions: No  ADL:  See FIM for current functional status  Therapy/Group: Individual Therapy  [place] 08/14/2013, 10:16 AM

## 2013-08-15 ENCOUNTER — Inpatient Hospital Stay (HOSPITAL_COMMUNITY): Payer: Medicare Other | Admitting: Occupational Therapy

## 2013-08-15 ENCOUNTER — Inpatient Hospital Stay (HOSPITAL_COMMUNITY): Payer: Medicare Other

## 2013-08-15 NOTE — Progress Notes (Signed)
Occupational Therapy Session Note  Patient Details  Name: Jack [name] MRN: [id number] Date of Birth: 1934-02-24  Today's Date: 08/15/2013 Time: 0900-1000 and 1100-1130 Time Calculation (min): 60 min and 30 min  Short Term Goals: Week 1:  OT Short Term Goal 1 (Week 1): Pt will be able to maintain static standing balance with min A to increase his ability to manage clothing over his hips. OT Short Term Goal 2 (Week 1): Pt will be able to don shirt with supervision. OT Short Term Goal 3 (Week 1): Pt will be able to don pants with mod A. OT Short Term Goal 4 (Week 1): Pt will be able to transfer to toilet with mod A x1. OT Short Term Goal 5 (Week 1): Pt will demonstrate increased functional use of his L hand by holding wash cloth and washing R arm.  Skilled Therapeutic Interventions/Progress Updates:    Visit 1:  No c/o pain.    Pt seen for BADL retraining of toileting, bathing at shower level, and dressing with a focus on use of LUE and trunk control along with sequencing and memory.  Pt continues to need to cues to recall steps of safe transfer, but has difficulty with too many verbal cues.  He does well with 2 verbal cues or less and slight tactile cues.  Pt had more functional use of L hand today. He used L hand to pull shirt off his arm, wash R arm and to assist pulling up his underwear.  To facilitate trunk control in standing, pt used stedy while brushing his teeth. Pt would initiate LLE leg extension when his knee began to buckle.  Pt resting in chair at end of session with his wife in the room.  Visit 2:  No c/o pain.  Pt seen this session in the gym for transfer training facilitating trunk control and LUE neuro re-ed exercises. Pt is demonstrating improved L side motor control and strength. -Squat pivot transfers 10x from w/c to mat facilitating with Bobath technique with min A. -Sit to partial stand with hands clasped in front of body with min A to steady balance when pt holding partial  stand for 3 seconds. -UE ranger on L to focus on controlled sh flex/ext and horizontal abd/add with a focus on trunk control. Pt needed a few cues to correct his posture into midline.   -Bilateral hands holding basket ball and reaching arms in flexion and rotation of trunk.   - L hand holding a 1lb ball and passing back and forth to R hand.   Pt returned to the room with his wife.  Therapy Documentation Precautions:  Precautions Precautions: Fall Precaution Comments: Severely impaired LUE/LLE sensation Restrictions Weight Bearing Restrictions: No   Pain: Pain Assessment Pain Assessment: No/denies pain Pain Score: 0-No pain ADL:  See FIM for current functional status  Therapy/Group: Individual Therapy  [place] 08/15/2013, 11:43 AM

## 2013-08-15 NOTE — Progress Notes (Addendum)
Physical Therapy Note  Patient Details  Name: Jack [name] MRN: [id number] Date of Birth: 1933/10/01 Today's Date: 08/15/2013 1130-1200, 30 min individual therapy 1300-1345, 45 min individual therapy No pain reported either tx  Tx1:  neuromuscular re-education via VCs, demo, tactile cues for components of squat pivot transfer, L LE stance stability in tall kneeling>< squat with bil UE support on stool in front, gait.  Squat pivot to L and R with mod assist, mod cues to slow down and stay low.  Pt was accurate identifying 2 of 4 steps to prepare for transfer ; he demonstrated L inattention and initially poor sequencing.  Educated pt and wife regarding L foot placement under knee before transferring, due to risk of ankle sprain due to insensate LLE.  Gait  X 15' using railing in hall on R, L AFO, mod/max assist for LLE stance stability, L foot placement, trunk and hip extension, forward gaze.  Pt was slow and safe.   Tx 2:  W/c propulsion using hemi technique x 25' with supervision.  Pt able to use cue card for steps of transfer, supervision for prep of w/c.  Squat pivot transfer , Bobath style to R/L to/from  NuStep, mod assist.  Nu Step for neuromuscular re-education via forced use, L hand splint to hold on grip, L visual cue on L knee to help attention to knee/hip alignment, at level 5 x 10 minutes, rated 12 on Borg scale.  Gait as above, L AFO x 20' with mod/max assist, with use of mirror and bright visual cue on pants at knee.    Bradlee Bridgers 08/15/2013, 7:59 AM

## 2013-08-15 NOTE — Patient Care Conference (Signed)
Inpatient RehabilitationTeam Conference and Plan of Care Update Date: 08/15/2013   Time: 11:10 AM    Patient Name: Jack [name]      Medical Record Number: [id number]  Date of Birth: Mar 13, 1934 Sex: Male         Room/Bed: 4M09C/4M09C-01 Payor Info: Payor: Marine scientist / Plan: [employer] MEDICARE COMPLETE / Product Type: *No Product type* /    Admitting Diagnosis: CVA  Admit Date/Time:  08/07/2013  6:17 PM Admission Comments: No comment available   Primary Diagnosis:  Bleeding in brain due to blood pressure disorder Principal Problem: Bleeding in brain due to blood pressure disorder  Patient Active Problem List   Diagnosis Date Noted  . Bleeding in brain due to blood pressure disorder 08/07/2013  . ICH (intracerebral hemorrhage) 08/02/2013  . Acute hemorrhagic infarction of brain 08/02/2013  . Hx of mitral valve repair   . History of stroke   . Sleep apnea 07/02/2011  . History of atrial fibrillation   . Cyst and pseudocyst of pancreas 09/13/2007  . Hypertension   . Premature ventricular contraction   . Hiatal hernia   . History of renal cyst   . BPH (benign prostatic hypertrophy)     Expected Discharge Date: Expected Discharge Date: 08/30/13  Team Members Present: Physician leading conference: Dr. Alysia Penna Social Worker Present: Alfonse Alpers, LCSW;Becky Sylvanna Burggraf, LCSW Nurse Present: Other (comment) Loree Fee Reardon-RN) PT Present: Georjean Mode, PT;Other (comment) Jilda Roche) OT Present: Gareth Morgan, Lorelee Cover, OT SLP Present: Germain Osgood, SLP PPS Coordinator present : Daiva Nakayama, RN, CRRN     Current Status/Progress Goal Weekly Team Focus  Medical   severe sensory ataxia, decreased memory  maintain stability  educate wife on cognitive deficits   Bowel/Bladder   Continent of bowel and bladder  Remain continent of bowel and bladder  Maintain current regimen   Swallow/Nutrition/ Hydration     [facility]        ADL's   set up grooming,  min bathing and UB dressing, mod LB dressing, max toileting, min squat pivot transfers. Pt has been progressing   supervision overall  ADL retraining, family education, LUE neuro re-ed, functional mobility, left side awareness   Mobility   Mod A transfers, S WC x150', gait with hand rail x30' with mod/max A, 3 stairs with mod A, min/S bed mobility  S transfers, S gait x 150' and w/c 150'; min assist 6 steps 1 rail  Neuro re-ed for L-sided motor control due to lack of sensation, gait training, stairs, and transfers in varying conditions   Communication     [facility]        Safety/Cognition/ Behavioral Observations    No unsafe behaviors        Pain   No complaints of pain  < 3  Assess and treat for pain q shift and prn   Skin   Rash to upper back improving- using microguard powder  Remain free from skin infection and breakdown  Assess skin q shift and prn      *See Care Plan and progress notes for long and short-term goals.  Barriers to Discharge: mod A    Possible Resolutions to Barriers:  ?extension vs WC     Discharge Planning/Teaching Needs:  Home with wife who has been here and attended therpaies with pt      Team Discussion:  Slow progress-lack of sensation in legs limits him and is making progress slower.  Poor memory-wife here and participates in therapies  with  Revisions to Treatment Plan:  Progressing slowly need to extend one more week to meet his goals and for wife to take care of   Continued Need for Acute Rehabilitation Level of Care: The patient requires daily medical management by a physician with specialized training in physical medicine and rehabilitation for the following conditions: Daily direction of a multidisciplinary physical rehabilitation program to ensure safe treatment while eliciting the highest outcome that is of practical value to the patient.: Yes Daily medical management of patient stability for increased activity during participation in an intensive  rehabilitation regime.: Yes Daily analysis of laboratory values and/or radiology reports with any subsequent need for medication adjustment of medical intervention for : Neurological problems  Nashly Olsson, [place] 08/15/2013, 3:26 PM

## 2013-08-15 NOTE — Progress Notes (Signed)
Subjective/Complaints: 80 y.o. left-handed male with history of atrial fibrillation, hypertension, PFO, CVA 2009 not on anticoagulations and very little residual weakness. Admitted 08/02/2013 with left-sided weakness. Cranial CT scan showed 17 x 21 hemorrhagic infarcts posterior right basal ganglia and progression of microvascular disease. His his systolic blood pressure 790/24 on arrival. Follow cranial CT scan 08/04/2013 with stable hypertensive hemorrhage that's stable and mild vasogenic edema. Patient enrolled in ATACH-II study for BP control. Neurology recommends repeat CCT in 2-3 weeks to decide on antiplatelet therapy  Oriented to person and place Review of Systems - Negative except Left side weak  Objective: Vital Signs: Blood pressure 104/67, pulse 75, temperature 98 F (36.7 C), temperature source Oral, resp. rate 18, height _0  (1.6 m), weight 64.6 kg (142 lb 6.7 oz), SpO2 19.00%. No results found. Results for orders placed during the hospital encounter of 08/07/13 (from the past 72 hour(s))  CREATININE, SERUM     Status: Abnormal   Collection Time    08/14/13  6:40 AM      Result Value Range   Creatinine, Ser 0.88  0.50 - 1.35 mg/dL   GFR calc non Af Amer 81 (*) >90 mL/min   GFR calc Af Amer >90  >90 mL/min   Comment: (NOTE)     The eGFR has been calculated using the CKD EPI equation.     This calculation has not been validated in all clinical situations.     eGFR's persistently <90 mL/min signify possible Chronic Kidney     Disease.  BASIC METABOLIC PANEL     Status: Abnormal   Collection Time    08/14/13  6:40 AM      Result Value Range   Sodium 144  137 - 147 mEq/L   Potassium 3.8  3.7 - 5.3 mEq/L   Chloride 103  96 - 112 mEq/L   CO2 29  19 - 32 mEq/L   Glucose, Bld 97  70 - 99 mg/dL   BUN 19  6 - 23 mg/dL   Creatinine, Ser 0.87  0.50 - 1.35 mg/dL   Calcium 8.9  8.4 - 10.5 mg/dL   GFR calc non Af Amer 81 (*) >90 mL/min   GFR calc Af Amer >90  >90 mL/min   Comment: (NOTE)     The eGFR has been calculated using the CKD EPI equation.     This calculation has not been validated in all clinical situations.     eGFR's persistently <90 mL/min signify possible Chronic Kidney     Disease.      Vitals reviewed.  Constitutional: He is oriented to person, place, and time. No distress  HEENT: oral mucosa moist. Missing a few teeth  Eyes:  Pupils reactive to light  Neck: Normal range of motion. Neck supple. No thyromegaly present.  Cardiovascular:  Rate controlled, irregular rhythm, small sem  Respiratory: Effort normal and breath sounds normal. No respiratory distress. No wheezes or rales  GI: Soft. Bowel sounds are normal. He exhibits no distension.  Neurological: He is alert and oriented to person, place, and time.  Flat affect. Follows commands. Speech is mildly dysarthric but intelligible. Improving oromotor control. . LUE is grossly 4/5 severe ataxia. LLE is better at 3+/5 HF, KE and 4- ADF/APF. Absent LT LUE and LLE. Limited insight and awareness.  Skin: Skin is warm and dry.    Assessment/Plan: 1. Functional deficits secondary to Right basal ganglia hemmorrhagic infarct which require 3+ hours per day of interdisciplinary therapy in  a comprehensive inpatient rehab setting. Physiatrist is providing close team supervision and 24 hour management of active medical problems listed below. Physiatrist and rehab team continue to assess barriers to discharge/monitor patient progress toward functional and medical goals. Team conference today please see physician documentation under team conference tab, met with team face-to-face to discuss problems,progress, and goals. Formulized individual treatment plan based on medical history, underlying problem and comorbidities. FIM: FIM - Bathing Bathing Steps Patient Completed: Left Arm;Chest;Right upper leg;Left upper leg;Abdomen;Front perineal area;Right lower leg (including foot);Left lower leg (including  foot);Right Arm;Buttocks Bathing: 4: Steadying assist  FIM - Upper Body Dressing/Undressing Upper body dressing/undressing steps patient completed: Thread/unthread left sleeve of front closure shirt/dress;Thread/unthread right sleeve of front closure shirt/dress;Thread/unthread right sleeve of pullover shirt/dresss;Thread/unthread left sleeve of pullover shirt/dress;Put head through opening of pull over shirt/dress;Pull shirt over trunk Upper body dressing/undressing: 4: Min-Patient completed 75 plus % of tasks FIM - Lower Body Dressing/Undressing Lower body dressing/undressing steps patient completed: Thread/unthread right underwear leg;Thread/unthread left underwear leg;Thread/unthread right pants leg;Thread/unthread left pants leg;Don/Doff right shoe;Pull underwear up/down;Don/Doff left shoe Lower body dressing/undressing: 3: Mod-Patient completed 50-74% of tasks  FIM - Toileting Toileting steps completed by patient: Performs perineal hygiene Toileting Assistive Devices: Grab bar or rail for support Toileting: 2: Max-Patient completed 1 of 3 steps  FIM - Radio producer Devices: Elevated toilet seat;Grab bars Toilet Transfers: 4-To toilet/BSC: Min A (steadying Pt. > 75%);4-From toilet/BSC: Min A (steadying Pt. > 75%)  FIM - Bed/Chair Transfer Bed/Chair Transfer Assistive Devices: Arm rests;Orthosis (AFO) Bed/Chair Transfer: 4: Sit > Supine: Min A (steadying pt. > 75%/lift 1 leg);3: Bed > Chair or W/C: Mod A (lift or lower assist);4: Chair or W/C > Bed: Min A (steadying Pt. > 75%)  FIM - Locomotion: Wheelchair Distance: 150 Locomotion: Wheelchair: 5: Travels 150 ft or more: maneuvers on rugs and over door sills with supervision, cueing or coaxing FIM - Locomotion: Ambulation Locomotion: Ambulation Assistive Devices: Other (comment) Engineer, agricultural rail) Ambulation/Gait Assistance: 2: Max assist Locomotion: Ambulation: 1: Travels less than 50 ft with maximal assistance  (Pt: 25 - 49%)  Comprehension Comprehension Mode: Auditory Comprehension: 5-Follows basic conversation/direction: With extra time/assistive device  Expression Expression Mode: Verbal Expression: 5-Expresses basic needs/ideas: With no assist  Social Interaction Social Interaction: 4-Interacts appropriately 75 - 89% of the time - Needs redirection for appropriate language or to initiate interaction.  Problem Solving Problem Solving: 5-Solves basic 90% of the time/requires cueing < 10% of the time  Memory Memory: 5-Recognizes or recalls 90% of the time/requires cueing < 10% of the time   Medical Problem List and Plan:  Hemorrhagic right basal ganglia infarcts  1. DVT Prophylaxis/Anticoagulation: Pharmaceutical: Lovenox  2. Pain Management: N/A  3. Mood: No signs of distress noted. LCSW to follow for evaluation.  4. Neuropsych: This patient is capable of making decisions on his own behalf.  5. HTN: Monitor with qid checks. Continue HCTZ, Avapro and Norvasc. Mild hypo K, supplement 6. BPH: continue proscar.   LOS (Days) 8 A FACE TO FACE EVALUATION WAS PERFORMED  Lenus Trauger E 08/15/2013, 6:53 AM

## 2013-08-15 NOTE — Progress Notes (Signed)
Occupational Therapy Session Note  Patient Details  Name: Jack [name] MRN: [id number] Date of Birth: 28-Nov-1933  Today's Date: 08/15/2013 Time: 1420-1450 Time Calculation (min): 30 min  Short Term Goals: Week 1:  OT Short Term Goal 1 (Week 1): Pt will be able to maintain static standing balance with min A to increase his ability to manage clothing over his hips. OT Short Term Goal 2 (Week 1): Pt will be able to don shirt with supervision. OT Short Term Goal 3 (Week 1): Pt will be able to don pants with mod A. OT Short Term Goal 4 (Week 1): Pt will be able to transfer to toilet with mod A x1. OT Short Term Goal 5 (Week 1): Pt will demonstrate increased functional use of his L hand by holding wash cloth and washing R arm.  Skilled Therapeutic Interventions/Progress Updates:  Patient resting in w/c upon arrival visiting with his wife.  Engaged in [place] to include forced use, motor control, dynamic standing, transitional movements, lateral weight shifts and sustained activation.  Patient focused on quality of movement, and eye hand coordination while practiced locking and unlocking his left w/c brake, sit><stands, stand at sink during LUE forced use activities while also working on standing balance/tolerance.  After much practice, patient and wife reported seeing LUE improvement when patient watched his left hand while it was at work.  Therapy Documentation Precautions:  Precautions Precautions: Fall Precaution Comments: Severely impaired LUE/LLE sensation Restrictions Weight Bearing Restrictions: No Pain: No report of pain  Therapy/Group: Individual Therapy  Yoltzin Ransom 08/15/2013, 5:11 PM

## 2013-08-16 ENCOUNTER — Inpatient Hospital Stay (HOSPITAL_COMMUNITY): Payer: Medicare Other

## 2013-08-16 ENCOUNTER — Inpatient Hospital Stay (HOSPITAL_COMMUNITY): Payer: Medicare Other | Admitting: Occupational Therapy

## 2013-08-16 NOTE — Progress Notes (Signed)
Subjective/Complaints: 80 y.o. left-handed male with history of atrial fibrillation, hypertension, PFO, CVA 2009 not on anticoagulations and very little residual weakness. Admitted 08/02/2013 with left-sided weakness. Cranial CT scan showed 17 x 21 hemorrhagic infarcts posterior right basal ganglia and progression of microvascular disease. His his systolic blood pressure 768/11 on arrival. Follow cranial CT scan 08/04/2013 with stable hypertensive hemorrhage that's stable and mild vasogenic edema. Patient enrolled in ATACH-II study for BP control. Neurology recommends repeat CCT in 2-3 weeks to decide on antiplatelet therapy  Oriented to person and place and time No C/Os Review of Systems - Negative except Left side weak  Objective: Vital Signs: Blood pressure 126/71, pulse 73, temperature 97.6 F (36.4 C), temperature source Oral, resp. rate 18, height '5\' 3"'  (1.6 m), weight 64.6 kg (142 lb 6.7 oz), SpO2 94.00%. No results found. Results for orders placed during the hospital encounter of 08/07/13 (from the past 72 hour(s))  CREATININE, SERUM     Status: Abnormal   Collection Time    08/14/13  6:40 AM      Result Value Range   Creatinine, Ser 0.88  0.50 - 1.35 mg/dL   GFR calc non Af Amer 81 (*) >90 mL/min   GFR calc Af Amer >90  >90 mL/min   Comment: (NOTE)     The eGFR has been calculated using the CKD EPI equation.     This calculation has not been validated in all clinical situations.     eGFR's persistently <90 mL/min signify possible Chronic Kidney     Disease.  BASIC METABOLIC PANEL     Status: Abnormal   Collection Time    08/14/13  6:40 AM      Result Value Range   Sodium 144  137 - 147 mEq/L   Potassium 3.8  3.7 - 5.3 mEq/L   Chloride 103  96 - 112 mEq/L   CO2 29  19 - 32 mEq/L   Glucose, Bld 97  70 - 99 mg/dL   BUN 19  6 - 23 mg/dL   Creatinine, Ser 0.87  0.50 - 1.35 mg/dL   Calcium 8.9  8.4 - 10.5 mg/dL   GFR calc non Af Amer 81 (*) >90 mL/min   GFR calc Af Amer >90   >90 mL/min   Comment: (NOTE)     The eGFR has been calculated using the CKD EPI equation.     This calculation has not been validated in all clinical situations.     eGFR's persistently <90 mL/min signify possible Chronic Kidney     Disease.      Vitals reviewed.  Constitutional: He is oriented to person, place, and time. No distress  HEENT: oral mucosa moist. Missing a few teeth  Eyes:  Pupils reactive to light  Neck: Normal range of motion. Neck supple. No thyromegaly present.  Cardiovascular:  Rate controlled, irregular rhythm, small sem  Respiratory: Effort normal and breath sounds normal. No respiratory distress. No wheezes or rales  GI: Soft. Bowel sounds are normal. He exhibits no distension.  Neurological: He is alert and oriented to person, place, and time.  Flat affect. Follows commands. Speech is mildly dysarthric but intelligible. Improving oromotor control. . LUE is grossly 4/5 severe ataxia. LLE is better at 3+/5 HF, KE and 4- ADF/APF. Absent LT LUE and LLE. Limited insight and awareness.  Skin: Skin is warm and dry.    Assessment/Plan: 1. Functional deficits secondary to Right basal ganglia hemmorrhagic infarct which require 3+ hours per  day of interdisciplinary therapy in a comprehensive inpatient rehab setting. Physiatrist is providing close team supervision and 24 hour management of active medical problems listed below. Physiatrist and rehab team continue to assess barriers to discharge/monitor patient progress toward functional and medical goals.  FIM: FIM - Bathing Bathing Steps Patient Completed: Left Arm;Chest;Right upper leg;Left upper leg;Abdomen;Front perineal area;Right lower leg (including foot);Left lower leg (including foot);Right Arm;Buttocks Bathing: 4: Steadying assist  FIM - Upper Body Dressing/Undressing Upper body dressing/undressing steps patient completed: Thread/unthread left sleeve of front closure shirt/dress;Thread/unthread right sleeve of  front closure shirt/dress;Thread/unthread right sleeve of pullover shirt/dresss;Thread/unthread left sleeve of pullover shirt/dress;Put head through opening of pull over shirt/dress;Pull shirt over trunk Upper body dressing/undressing: 4: Min-Patient completed 75 plus % of tasks FIM - Lower Body Dressing/Undressing Lower body dressing/undressing steps patient completed: Thread/unthread right underwear leg;Thread/unthread left underwear leg;Thread/unthread right pants leg;Thread/unthread left pants leg;Don/Doff right shoe;Pull underwear up/down;Don/Doff left shoe Lower body dressing/undressing: 3: Mod-Patient completed 50-74% of tasks  FIM - Toileting Toileting steps completed by patient: Adjust clothing prior to toileting;Performs perineal hygiene Toileting Assistive Devices: Grab bar or rail for support Toileting: 3: Mod-Patient completed 2 of 3 steps  FIM - Radio producer Devices: Elevated toilet seat;Grab bars Toilet Transfers: 4-To toilet/BSC: Min A (steadying Pt. > 75%);4-From toilet/BSC: Min A (steadying Pt. > 75%)  FIM - Bed/Chair Transfer Bed/Chair Transfer Assistive Devices: Arm rests;Orthosis Bed/Chair Transfer: 3: Bed > Chair or W/C: Mod A (lift or lower assist);3: Chair or W/C > Bed: Mod A (lift or lower assist)  FIM - Locomotion: Wheelchair Distance: s25 Locomotion: Wheelchair: 1: Travels less than 50 ft with supervision, cueing or coaxing FIM - Locomotion: Ambulation Locomotion: Ambulation Assistive Devices: Other (comment) (hall railing on R) Ambulation/Gait Assistance: 2: Max assist Locomotion: Ambulation: 1: Travels less than 50 ft with maximal assistance (Pt: 25 - 49%)  Comprehension Comprehension Mode: Auditory Comprehension: 5-Understands complex 90% of the time/Cues < 10% of the time  Expression Expression Mode: Verbal Expression: 5-Expresses complex 90% of the time/cues < 10% of the time  Social Interaction Social Interaction:  4-Interacts appropriately 75 - 89% of the time - Needs redirection for appropriate language or to initiate interaction.  Problem Solving Problem Solving: 5-Solves basic 90% of the time/requires cueing < 10% of the time  Memory Memory: 5-Recognizes or recalls 90% of the time/requires cueing < 10% of the time   Medical Problem List and Plan:  Hemorrhagic right basal ganglia infarcts, left sensory ataxia  1. DVT Prophylaxis/Anticoagulation: Pharmaceutical: Lovenox  2. Pain Management: N/A  3. Mood: No signs of distress noted. LCSW to follow for evaluation.  4. Neuropsych: This patient is capable of making decisions on his own behalf.  5. HTN: Monitor with qid checks. Continue HCTZ, Avapro and Norvasc. Mild hypo K, supplement 6. BPH: continue proscar.   LOS (Days) 9 A FACE TO FACE EVALUATION WAS PERFORMED  KIRSTEINS,ANDREW E 08/16/2013, 8:19 AM

## 2013-08-16 NOTE — Progress Notes (Signed)
Occupational Therapy Session Note  Patient Details  Name: Jack [name] MRN: [id number] Date of Birth: 12/26/1933  Today's Date: 08/16/2013 Time: 1400-1445 Time Calculation (min): 45 min  Short Term Goals: Week 2:  OT Short Term Goal 1 (Week 2): Pt will be able to stand with min A while adjusting clothing over his hips.  OT Short Term Goal 2 (Week 2): Pt will be able to fully pull his underwear and pants over hips. OT Short Term Goal 3 (Week 2): Pt will demonstrate improved L hand control by using it to pull on his socks with mod A. OT Short Term Goal 4 (Week 2): Pt will be able to recall the steps of his safe squat pivot transfer with min cues.  Skilled Therapeutic Interventions/Progress Updates:  Patient resting in w/c upon arrival with wife at his side.  NMR to include forced use of LLE & LUE, postural control, sustained left sided muscle activation, static and dynamic standing balance, sit><stands, w/c><mat and w/c>bed transfers.  Patient attempted to propell w/c forward and backward using only LLE and min-mod assist.  In standing EOB, patient practiced lateral weight shifts to left then to midline, BLE squats, and LUE gross and fine motor tasks.  While standing EOM, patient instructed to turn to his right and face the mat however, patient's LLE did not come with him so he laid his chest on the mat with his BLEs extended off the mat.  Patient was assisted to crawl onto mat leading with RLE then he rolled onto his right right side.  Once patient was back in his room he was assisted back to bed.  Therapy Documentation Precautions:  Precautions Precautions: Fall Precaution Comments: Severely impaired LUE/LLE sensation Restrictions Weight Bearing Restrictions: No Pain: No report of pain  Therapy/Group: Individual Therapy  Nakea Gouger 08/16/2013, 2:49 PM

## 2013-08-16 NOTE — Progress Notes (Signed)
Social Work Patient ID: Jack [name], male   DOB: 09/27/1933, 79 y.o.   MRN: [id number] Met with pt and wife top inform of team conference progression toward his goals of supervision/min level.  Discussed needed to lengthen  Stay due to progressing slower than team thought and want him to reach supervision level goals for wife to be able to manage him at home. Both were on board with this.  New discharge date 2/19.  He states; " I will stay to get better for my wife."  Wife states; " I want him to be at a level I can assist him."  Will work on discharge plans and provide support.   

## 2013-08-16 NOTE — Progress Notes (Signed)
Physical Therapy Note  Patient Details  Name: Jack [name] MRN: [id number] Date of Birth: 05-15-1934 Today's Date: 08/16/2013 7035-0093; 1305-1335  60 min, 30 min  individual therapy  No pain reported Am or PM txs  tx 1:  neuromuscular re-education via forced use, demo, visual feedback of sticker on L knee using mirror, manual and VCS, for LLE stance stability in sitting and standing on [place], gait, repetitive sit>< stand without use of UEs, L hamstring curls in standing, w/c propulsion using bil LEs   Gait in hall using R rail, x 20' with mod/max assist for LLE stance stability, L foot placement, upright trunk, forward gaze.  HS curls x 10 reps with good excursion but poor control.  W/c propulsion x 20' with min assist , using bil LEs with good control of LLE x 10' then deteriorating with fatigue. W/c propulsion x 150' with RUE and RLE.    Tx 2:  neuromuscular re-education via forced use, demo, tactile and manual cues, VCs for sitting dynamic balance, reaching out of BOS with L and R hands in all directions, with min assist intermittently.   Squat pivot transfers to L and R with min/mod assist.  Pt used written cue card slowly in order to prep w/c for transfer.  Sit>< supine on firm mat, through L sidelying, with max cues, min assist for LLE.    L 1/2 bridging with LLE off mat, working on active L hip extension.    Supine> sit with extra time, max cues for sequencing for normal movement patterns, supervision.  Yerlin Gasparyan 08/16/2013, 7:48 AM

## 2013-08-16 NOTE — Progress Notes (Signed)
Occupational Therapy Weekly Progress Note  Patient Details  Name: Jack [name] MRN: [id number] Date of Birth: 1934-04-19  Today's Date: 08/16/2013 Time: 0900-1000 Time Calculation (min): 60 min  Patient has met 4 of 5 short term goals.  Pt continues to need mod A with standing for LB self care, but has been making excellent progress with his transfers, sitting balance, use of LUE, and L side awareness.  Patient continues to demonstrate the following deficits: L side sensory loss and ataxia, decreased midline awareness, LUE inattention, decreased LLE control in standing and therefore will continue to benefit from skilled OT intervention to enhance overall performance with BADL.  Patient progressing toward long term goals..  Continue plan of care.  OT Short Term Goals Week 1:  OT Short Term Goal 1 (Week 1): Pt will be able to maintain static standing balance with min A to increase his ability to manage clothing over his hips. OT Short Term Goal 1 - Progress (Week 1): Progressing toward goal OT Short Term Goal 2 (Week 1): Pt will be able to don shirt with supervision. OT Short Term Goal 2 - Progress (Week 1): Met OT Short Term Goal 3 (Week 1): Pt will be able to don pants with mod A. OT Short Term Goal 3 - Progress (Week 1): Met OT Short Term Goal 4 (Week 1): Pt will be able to transfer to toilet with mod A x1. OT Short Term Goal 4 - Progress (Week 1): Met OT Short Term Goal 5 (Week 1): Pt will demonstrate increased functional use of his L hand by holding wash cloth and washing R arm. OT Short Term Goal 5 - Progress (Week 1): Met Week 2:  OT Short Term Goal 1 (Week 2): Pt will be able to stand with min A while adjusting clothing over his hips.  OT Short Term Goal 2 (Week 2): Pt will be able to fully pull his underwear and pants over hips. OT Short Term Goal 3 (Week 2): Pt will demonstrate improved L hand control by using it to pull on his socks with mod A. OT Short Term Goal 4 (Week 2): Pt  will be able to recall the steps of his safe squat pivot transfer with min cues.  Skilled Therapeutic Interventions/Progress Updates:      Pt seen for BADL retraining of toileting, bathing, and dressing with a focus on safe functional transfers, standing balance, and using his LUE.  Pt was using his LUE more actively and was able to recognize when his arm was not in a safe position 50% of the time.  He is able to elongate his trunk to the left and elevate his R hip to allow him to cleanse his bottom after toileting and bathing.  Pt continues to need mod A when standing, but is actively trying to engage his glutes and quads.  Pt stood in [place] to brush his teeth at the sink demonstrating improved midline awareness. Pt resting in w/c at end of session with his wife in the room with him.  Therapy Documentation Precautions:  Precautions Precautions: Fall Precaution Comments: Severely impaired LUE/LLE sensation Restrictions Weight Bearing Restrictions: No   Pain: Pain Assessment Pain Assessment: No/denies pain ADL:  See FIM for current functional status  Therapy/Group: Individual Therapy  [place] 08/16/2013, 12:14 PM

## 2013-08-17 ENCOUNTER — Inpatient Hospital Stay (HOSPITAL_COMMUNITY): Payer: Medicare Other | Admitting: Occupational Therapy

## 2013-08-17 ENCOUNTER — Inpatient Hospital Stay (HOSPITAL_COMMUNITY): Payer: Medicare Other

## 2013-08-17 ENCOUNTER — Encounter: Payer: Self-pay | Admitting: Cardiology

## 2013-08-17 DIAGNOSIS — I619 Nontraumatic intracerebral hemorrhage, unspecified: Secondary | ICD-10-CM

## 2013-08-17 DIAGNOSIS — Z8673 Personal history of transient ischemic attack (TIA), and cerebral infarction without residual deficits: Secondary | ICD-10-CM

## 2013-08-17 DIAGNOSIS — I4891 Unspecified atrial fibrillation: Secondary | ICD-10-CM

## 2013-08-17 DIAGNOSIS — I10 Essential (primary) hypertension: Secondary | ICD-10-CM

## 2013-08-17 MED ORDER — ASPIRIN EC 325 MG PO TBEC
325.0000 mg | DELAYED_RELEASE_TABLET | Freq: Every day | ORAL | Status: DC
  Administered 2013-08-17 – 2013-08-20 (×4): 325 mg via ORAL
  Filled 2013-08-17 (×5): qty 1

## 2013-08-17 NOTE — Progress Notes (Signed)
Subjective/Complaints: 80 y.o. left-handed male with history of atrial fibrillation, hypertension, PFO, CVA 2009 not on anticoagulations and very little residual weakness. Admitted 08/02/2013 with left-sided weakness. Cranial CT scan showed 17 x 21 hemorrhagic infarcts posterior right basal ganglia and progression of microvascular disease. His his systolic blood pressure 500/93 on arrival. Follow cranial CT scan 08/04/2013 with stable hypertensive hemorrhage that's stable and mild vasogenic edema. Patient enrolled in ATACH-II study for BP control. Neurology recommends repeat CCT in 2-3 weeks to decide on antiplatelet therapy  No new complaints. Had questions about the results of his CT scan.  No C/Os Review of Systems - Negative except Left side weak  Objective: Vital Signs: Blood pressure 150/86, pulse 94, temperature 97.8 F (36.6 C), temperature source Oral, resp. rate 18, height 5\' 3"  (1.6 m), weight 64.6 kg (142 lb 6.7 oz), SpO2 96.00%. Ct Head Wo Contrast  08/17/2013   CLINICAL DATA:  Follow-up intracranial hemorrhage. Persistent left-sided weakness.  EXAM: CT HEAD WITHOUT CONTRAST  TECHNIQUE: Contiguous axial images were obtained from the base of the skull through the vertex without intravenous contrast.  COMPARISON:  08/04/2013  FINDINGS: Previously described hemorrhage involving the right thalamus and posterior lentiform nucleus demonstrates interval evolution with decreased size and density of blood products. Mild surrounding vasogenic edema is similar to the prior study. There remains mild mass effect on the body of the right lateral ventricle. There is no significant midline shift. There is no evidence of new intracranial hemorrhage.  Left parietal encephalomalacia is unchanged and suggestive of old infarct. Remote left cerebellar lacunar infarct is unchanged. There is no evidence of acute cortical infarct. No extra-axial fluid collection. Mild cerebral atrophy is unchanged. Patchy and  confluent periventricular white matter hypodensities do not appear significantly changed and are compatible with moderate chronic small vessel ischemic disease. Orbits are unremarkable. Mastoid air cells are clear. Trace left sphenoid sinus mucosal thickening versus fluid is unchanged.  IMPRESSION: Expected evolution of right thalamic/posterior lentiform nucleus hemorrhage with similar edema. No evidence of acute intracranial abnormality.   Electronically Signed   By: Logan Bores   On: 08/17/2013 08:03   No results found for this or any previous visit (from the past 72 hour(s)).    Vitals reviewed.  Constitutional: He is oriented to person, place, and time. No distress  HEENT: oral mucosa moist. Missing a few teeth  Eyes:  Pupils reactive to light  Neck: Normal range of motion. Neck supple. No thyromegaly present.  Cardiovascular:  Rate controlled, irregular rhythm, small sem  Respiratory: Effort normal and breath sounds normal. No respiratory distress. No wheezes or rales  GI: Soft. Bowel sounds are normal. He exhibits no distension.  Neurological: He is alert and oriented to person, place, and time.  Flat affect. Follows commands. Speech is mildly dysarthric but intelligible. Improving oromotor control. . LUE is grossly 4/5 severe ataxia. LLE is better at 3+/5 HF, KE and 4- ADF/APF. Absent LT LUE and LLE. Limited insight and awareness.  Skin: Skin is warm and dry.    Assessment/Plan: 1. Functional deficits secondary to Right basal ganglia hemmorrhagic infarct which require 3+ hours per day of interdisciplinary therapy in a comprehensive inpatient rehab setting. Physiatrist is providing close team supervision and 24 hour management of active medical problems listed below. Physiatrist and rehab team continue to assess barriers to discharge/monitor patient progress toward functional and medical goals.  FIM: FIM - Bathing Bathing Steps Patient Completed: Left Arm;Chest;Right upper leg;Left  upper leg;Abdomen;Front perineal area;Right lower leg (  including foot);Left lower leg (including foot);Right Arm;Buttocks Bathing: 4: Steadying assist  FIM - Upper Body Dressing/Undressing Upper body dressing/undressing steps patient completed: Thread/unthread left sleeve of front closure shirt/dress;Thread/unthread right sleeve of front closure shirt/dress;Thread/unthread right sleeve of pullover shirt/dresss;Thread/unthread left sleeve of pullover shirt/dress;Put head through opening of pull over shirt/dress;Pull shirt over trunk Upper body dressing/undressing: 4: Min-Patient completed 75 plus % of tasks FIM - Lower Body Dressing/Undressing Lower body dressing/undressing steps patient completed: Thread/unthread right underwear leg;Thread/unthread left underwear leg;Thread/unthread right pants leg;Thread/unthread left pants leg;Don/Doff right shoe;Pull underwear up/down;Don/Doff left shoe Lower body dressing/undressing: 3: Mod-Patient completed 50-74% of tasks  FIM - Toileting Toileting steps completed by patient: Adjust clothing prior to toileting;Performs perineal hygiene Toileting Assistive Devices: Grab bar or rail for support Toileting: 3: Mod-Patient completed 2 of 3 steps  FIM - Radio producer Devices: Elevated toilet seat;Grab bars Toilet Transfers: 4-To toilet/BSC: Min A (steadying Pt. > 75%);4-From toilet/BSC: Min A (steadying Pt. > 75%)  FIM - Bed/Chair Transfer Bed/Chair Transfer Assistive Devices: Arm rests;Orthosis Bed/Chair Transfer: 4: Sit > Supine: Min A (steadying pt. > 75%/lift 1 leg);5: Supine > Sit: Supervision (verbal cues/safety issues);3: Bed > Chair or W/C: Mod A (lift or lower assist);3: Chair or W/C > Bed: Mod A (lift or lower assist)  FIM - Locomotion: Wheelchair Distance: 150 Locomotion: Wheelchair: 5: Travels 150 ft or more: maneuvers on rugs and over door sills with supervision, cueing or coaxing FIM - Locomotion:  Ambulation Locomotion: Ambulation Assistive Devices: Other (comment) (R rail in hallway) Ambulation/Gait Assistance: 2: Max assist Locomotion: Ambulation: 1: Travels less than 50 ft with maximal assistance (Pt: 25 - 49%)  Comprehension Comprehension Mode: Auditory Comprehension: 5-Understands complex 90% of the time/Cues < 10% of the time  Expression Expression Mode: Verbal Expression: 5-Expresses complex 90% of the time/cues < 10% of the time  Social Interaction Social Interaction: 4-Interacts appropriately 75 - 89% of the time - Needs redirection for appropriate language or to initiate interaction.  Problem Solving Problem Solving: 5-Solves basic 90% of the time/requires cueing < 10% of the time  Memory Memory: 5-Recognizes or recalls 90% of the time/requires cueing < 10% of the time   Medical Problem List and Plan:  Hemorrhagic right basal ganglia infarcts, left sensory ataxia  -expected evolution of infarcts on head CT today   -neurology follow up regarding antiplatelet therapy 1. DVT Prophylaxis/Anticoagulation: Pharmaceutical: Lovenox  2. Pain Management: N/A  3. Mood: No signs of distress noted. LCSW to follow for evaluation.  4. Neuropsych: This patient is capable of making decisions on his own behalf.  5. HTN: Monitor with qid checks. Continue HCTZ, Avapro and Norvasc. Mild hypo K supplemented 6. BPH: continue proscar.   LOS (Days) 10 A FACE TO FACE EVALUATION WAS PERFORMED  Riggs Dineen T 08/17/2013, 8:43 AM

## 2013-08-17 NOTE — Progress Notes (Signed)
Occupational Therapy Session Note  Patient Details  Name: Jack [name] MRN: [id number] Date of Birth: Dec 17, 1933  Today's Date: 08/17/2013 Time: 0800-0900 Time Calculation (min): 60 min  Short Term Goals: Week 1:  OT Short Term Goal 1 (Week 1): Pt will be able to maintain static standing balance with min A to increase his ability to manage clothing over his hips. OT Short Term Goal 1 - Progress (Week 1): Progressing toward goal OT Short Term Goal 2 (Week 1): Pt will be able to don shirt with supervision. OT Short Term Goal 2 - Progress (Week 1): Met OT Short Term Goal 3 (Week 1): Pt will be able to don pants with mod A. OT Short Term Goal 3 - Progress (Week 1): Met OT Short Term Goal 4 (Week 1): Pt will be able to transfer to toilet with mod A x1. OT Short Term Goal 4 - Progress (Week 1): Met OT Short Term Goal 5 (Week 1): Pt will demonstrate increased functional use of his L hand by holding wash cloth and washing R arm. OT Short Term Goal 5 - Progress (Week 1): Met Week 2:  OT Short Term Goal 1 (Week 2): Pt will be able to stand with min A while adjusting clothing over his hips.  OT Short Term Goal 2 (Week 2): Pt will be able to fully pull his underwear and pants over hips. OT Short Term Goal 3 (Week 2): Pt will demonstrate improved L hand control by using it to pull on his socks with mod A. OT Short Term Goal 4 (Week 2): Pt will be able to recall the steps of his safe squat pivot transfer with min cues.  Skilled Therapeutic Interventions/Progress Updates:      Pt seen for BADL retraining of toileting, bathing, and dressing with a focus on forced use of LUE, postural control and balance, recalling steps of squat pivot transfers.  Pt continues to need cues for head and LLE position, but his overall transfers are becoming smoother and more efficient.  Pt did use L hand actively in bathing and UB dressing.  He attempted a lateral pinch to don his socks to use both hands, but needed A to  maintain his grip. Stood at sink to brush teeth with min-mod A to support balance.  Pt demonstrated improved standing balance when pulling pants over hips.  Pt resting in w/c at end of session with his wife in the room.  Therapy Documentation Precautions:  Precautions Precautions: Fall Precaution Comments: Severely impaired LUE/LLE sensation Restrictions Weight Bearing Restrictions: No    Vital Signs: Therapy Vitals BP: 130/80 mmHg Pain: Pain Assessment Pain Assessment: No/denies pain ADL:  See FIM for current functional status  Therapy/Group: Individual Therapy  [place] 08/17/2013, 9:51 AM

## 2013-08-17 NOTE — Progress Notes (Signed)
Physical Therapy Session Note  Patient Details  Name: Jack [name] MRN: [id number] Date of Birth: 09/19/1933  Today's Date: 08/17/2013 Time: 8676-1950 Time Calculation (min): 40 min  Short Term Goals: Week 2:  PT Short Term Goal 1 (Week 2): Pt will perform bed<>chair transfers with Min A PT Short Term Goal 2 (Week 2): Pt will ambulate 100' with Min A and LRAD PT Short Term Goal 3 (Week 2): Pt will ascend/descend 6 step swith Min A PT Short Term Goal 4 (Week 2): Pt will demonstrate dynamic standing balance with Min A during functional task PT Short Term Goal 5 (Week 2): Pt will perform bed mobiltiy with S  Skilled Therapeutic Interventions/Progress Updates:    Session focused on w/c propulsion using hemi-technique on R to/from therapy with S, transfer training using squat pivot technique with steady A and cues for positioning of LLE and set-up of w/c, stand pivot transfer with mod A, and neuro re-ed during sit to stands with focus on balance, postural control, and technique with min A overall (L knee required manual facilitation due to instability).   Therapy Documentation Precautions:  Precautions Precautions: Fall Precaution Comments: Severely impaired LUE/LLE sensation Restrictions Weight Bearing Restrictions: No  Pain: Pain Assessment Pain Assessment: No/denies pain  See FIM for current functional status  Therapy/Group: Individual Therapy  Canary Brim [facility] 08/17/2013, 11:47 AM

## 2013-08-17 NOTE — Progress Notes (Signed)
Occupational Therapy Note  Patient Details  Name: Jack [name] MRN: [id number] Date of Birth: Mar 16, 1934 Today's Date: 08/17/2013  Time: 1045-1130 Pt denies pain Individual Therapy  Pt engaged in dynamic standing tasks while using LUE to grasp clothes pins and place them on dowel.  Pt required assistance with controlling arm when reaching to place clothes pins on dowel.  Pt transitioned to removing large pegs from peg board and folding wash cloths.  Pt completed all tasks while standing at hi-lo table with intermittent assistance stabilizing left knee.  Pt continues to exhibit ataxic movements with LUE especially during open chain tasks.    Leotis Shames [facility] 08/17/2013, 12:04 PM

## 2013-08-17 NOTE — Progress Notes (Signed)
Occupational Therapy Session Note  Patient Details  Name: Jack [name] MRN: [id number] Date of Birth: Dec 09, 1933  Today's Date: 08/17/2013 Time: 1308-6578 Time Calculation (min): 45 min  Short Term Goals: Week 2:  OT Short Term Goal 1 (Week 2): Pt will be able to stand with min A while adjusting clothing over his hips.  OT Short Term Goal 2 (Week 2): Pt will be able to fully pull his underwear and pants over hips. OT Short Term Goal 3 (Week 2): Pt will demonstrate improved L hand control by using it to pull on his socks with mod A. OT Short Term Goal 4 (Week 2): Pt will be able to recall the steps of his safe squat pivot transfer with min cues.  Skilled Therapeutic Interventions/Progress Updates:  Patient resting in w/c with wife at his side upon arrival.  Engaged in [place] to include heavy LUE & LLE weightbearing, forward and lateral weight shifts, LUE & LLE sustained muscle activity, squat pivot transfers with forward weight shifts and LUE gross and fine motor coordination tasks.     Therapy Documentation Precautions:  Precautions Precautions: Fall Precaution Comments: Severely impaired LUE/LLE sensation Restrictions Weight Bearing Restrictions: No Pain: Denies pain See FIM for current functional status  Therapy/Group: Individual Therapy  Merryn Thaker 08/17/2013, 4:56 PM

## 2013-08-18 ENCOUNTER — Inpatient Hospital Stay (HOSPITAL_COMMUNITY): Payer: Medicare Other | Admitting: Physical Therapy

## 2013-08-18 DIAGNOSIS — I609 Nontraumatic subarachnoid hemorrhage, unspecified: Secondary | ICD-10-CM

## 2013-08-18 DIAGNOSIS — N400 Enlarged prostate without lower urinary tract symptoms: Secondary | ICD-10-CM

## 2013-08-18 NOTE — Progress Notes (Signed)
Jack [name] is a 80 y.o. male 1934-04-06 [id number]  Subjective: No new complaints. No new problems. Slept well. Feeling OK.  Objective: Vital signs in last 24 hours: Temp:  [98 F (36.7 C)-98.6 F (37 C)] 98 F (36.7 C) (02/07 0605) Pulse Rate:  [73-85] 73 (02/07 0605) Resp:  [16-17] 17 (02/07 0605) BP: (119-144)/(66-77) 144/77 mmHg (02/07 0605) SpO2:  [96 %] 96 % (02/07 0605) Weight change:  Last BM Date: 08/16/13  Intake/Output from previous day: 02/06 0701 - 02/07 0700 In: 360 [P.O.:360] Out: 425 [Urine:425] Last cbgs: CBG (last 3)  No results found for this basename: GLUCAP,  in the last 72 hours   Physical Exam General: No apparent distress   Lungs: Normal effort. Lungs clear to auscultation, no crackles or wheezes. Cardiovascular: Irregular rate and rhythm, no edema Abdomen: S/NT/ND; BS(+) Musculoskeletal:  No new findings Neurological: No new neurological deficits Wounds: dressing N/A    A/o/c    Lab Results: BMET    Component Value Date/Time   NA 144 08/14/2013 0640   K 3.8 08/14/2013 0640   CL 103 08/14/2013 0640   CO2 29 08/14/2013 0640   GLUCOSE 97 08/14/2013 0640   BUN 19 08/14/2013 0640   CREATININE 0.88 08/14/2013 0640   CREATININE 0.87 08/14/2013 0640   CREATININE 0.90 06/25/2011 1003   CALCIUM 8.9 08/14/2013 0640   GFRNONAA 81* 08/14/2013 0640   GFRNONAA 81* 08/14/2013 0640   GFRAA >90 08/14/2013 0640   GFRAA >90 08/14/2013 0640   CBC    Component Value Date/Time   WBC 8.8 08/08/2013 0900   RBC 4.99 08/08/2013 0900   HGB 15.2 08/08/2013 0900   HCT 43.4 08/08/2013 0900   PLT 334 08/08/2013 0900   MCV 87.0 08/08/2013 0900   MCH 30.5 08/08/2013 0900   MCHC 35.0 08/08/2013 0900   RDW 13.7 08/08/2013 0900   LYMPHSABS 1.2 08/08/2013 0900   MONOABS 0.7 08/08/2013 0900   EOSABS 0.3 08/08/2013 0900   BASOSABS 0.0 08/08/2013 0900    Studies/Results: Ct Head Wo Contrast  08/17/2013   CLINICAL DATA:  Follow-up intracranial hemorrhage. Persistent left-sided weakness.  EXAM:  CT HEAD WITHOUT CONTRAST  TECHNIQUE: Contiguous axial images were obtained from the base of the skull through the vertex without intravenous contrast.  COMPARISON:  08/04/2013  FINDINGS: Previously described hemorrhage involving the right thalamus and posterior lentiform nucleus demonstrates interval evolution with decreased size and density of blood products. Mild surrounding vasogenic edema is similar to the prior study. There remains mild mass effect on the body of the right lateral ventricle. There is no significant midline shift. There is no evidence of new intracranial hemorrhage.  Left parietal encephalomalacia is unchanged and suggestive of old infarct. Remote left cerebellar lacunar infarct is unchanged. There is no evidence of acute cortical infarct. No extra-axial fluid collection. Mild cerebral atrophy is unchanged. Patchy and confluent periventricular white matter hypodensities do not appear significantly changed and are compatible with moderate chronic small vessel ischemic disease. Orbits are unremarkable. Mastoid air cells are clear. Trace left sphenoid sinus mucosal thickening versus fluid is unchanged.  IMPRESSION: Expected evolution of right thalamic/posterior lentiform nucleus hemorrhage with similar edema. No evidence of acute intracranial abnormality.   Electronically Signed   By: Logan Bores   On: 08/17/2013 08:03    Medications: I have reviewed the patient's current medications.  Assessment/Plan:  Hemorrhagic right basal ganglia infarcts, left sensory ataxia  -expected evolution of infarcts on head CT today  -neurology follow  up regarding antiplatelet therapy  1. DVT Prophylaxis/Anticoagulation: Pharmaceutical: Lovenox  2. Pain Management: N/A  3. Mood: No signs of distress noted. LCSW to follow for evaluation.  4. Neuropsych: This patient is capable of making decisions on his own behalf.  5. HTN: Monitor with qid checks. Continue HCTZ, Avapro and Norvasc. Mild hypo K  supplemented  6. BPH: continue proscar.   Cont Rx     Length of stay, days: [place] , MD 08/18/2013, 9:16 AM

## 2013-08-18 NOTE — Progress Notes (Signed)
Physical Therapy Session Note  Patient Details  Name: Jack [name] MRN: [id number] Date of Birth: Dec 10, 1933  Today's Date: 08/18/2013 Time: 0730-0815 Time Calculation (min): 45 min  Short Term Goals: Week 1:  PT Short Term Goal 1 (Week 1): pt will move supine> sit with min assist PT Short Term Goal 1 - Progress (Week 1): Met PT Short Term Goal 2 (Week 1): pt will transfer bed>< w/c with mod asssist PT Short Term Goal 2 - Progress (Week 1): Met PT Short Term Goal 3 (Week 1): pt will propel w/c x 150' with supervision PT Short Term Goal 3 - Progress (Week 1): Met PT Short Term Goal 4 (Week 1): pt will perform gait x 50' with mod assist and LRAD PT Short Term Goal 4 - Progress (Week 1): Progressing toward goal PT Short Term Goal 5 (Week 1): pt will ascend and descend 3 steps 2 rails with mod assist PT Short Term Goal 5 - Progress (Week 1): Met  Therapy Documentation Precautions:  Precautions Precautions: Fall Precaution Comments: Severely impaired LUE/LLE sensation Restrictions Weight Bearing Restrictions: No Vital Signs: Therapy Vitals Temp: 98 F (36.7 C) Temp src: Oral Pulse Rate: 73 Resp: 17 BP: 144/77 mmHg Patient Position, if appropriate: Lying Oxygen Therapy SpO2: 96 % O2 Device: None (Room air) Pain: denies pain  Therapeutic Activity:(15') bed mobility rolling R and L Mod-I, supine to sit with S/Mod-Independent, scoot to EOB with min-guard, Transfer stand-pivot transfer bed->wheelchair with min-assist. Mod-assist with toilet transfers and Max-assist for hygiene as well as pulling pants up. Therapeutic Exercise:(15') supine stretching and A/AAROM L LE. NeuroMuscular Re-education:(15') static and dynamic sitting balance at EOB for midline orientation and recovery to midline after dynamic trunk leans into all directions. Patient tends to lose sitting posture to the left.  Therapy/Group: Individual Therapy  Novalynn Branaman J 08/18/2013, 7:40 AM

## 2013-08-19 ENCOUNTER — Inpatient Hospital Stay (HOSPITAL_COMMUNITY): Payer: Medicare Other | Admitting: Occupational Therapy

## 2013-08-19 NOTE — H&P (Signed)
PRN trazodone 50mg  given at 0014, slept good rest of night. Calls for assist with urinal...................Jack [name] A

## 2013-08-19 NOTE — Progress Notes (Signed)
Occupational Therapy Session Note  Patient Details  Name: Jack [name] MRN: [id number] Date of Birth: [birth year]  Today's Date: 08/19/2013 Time: 1100-1200 Time Calculation (min): 60 min  Short Term Goals: Week 2:  OT Short Term Goal 1 (Week 2): Pt will be able to stand with min A while adjusting clothing over his hips.  OT Short Term Goal 2 (Week 2): Pt will be able to fully pull his underwear and pants over hips. OT Short Term Goal 3 (Week 2): Pt will demonstrate improved L hand control by using it to pull on his socks with mod A. OT Short Term Goal 4 (Week 2): Pt will be able to recall the steps of his safe squat pivot transfer with min cues.  Skilled Therapeutic Interventions/Progress Updates:  Patient resting in bed upon arrival.  Engaged in self care retraining to include shower, dress and groom.  Focused session on compensating for impaired sensation and coordination by visually attending to the LUE & LLE, forced use of LUE, sustained LUE & LLE muscle activity during BADL and during all functional mobility, postural control, static and dynamic balance.  Patient requires max cues to visually attend, and maintain muscle activity especially when standing during BADL.  Patient able to don right sock with only vcs by using both hands to start the sock then left hand to finish pulling up the sock.  Patient able to pull up underware and pants over hips with vcs and steady assist.  Therapy Documentation Precautions:  Precautions Precautions: Fall Precaution Comments: Severely impaired LUE/LLE sensation Restrictions Weight Bearing Restrictions: No  Denies pain  ADL: See FIM for current functional status  Therapy/Group: Individual Therapy  Shawnese Magner 08/19/2013, 12:01 PM

## 2013-08-20 ENCOUNTER — Inpatient Hospital Stay (HOSPITAL_COMMUNITY): Payer: Medicare Other | Admitting: Occupational Therapy

## 2013-08-20 ENCOUNTER — Inpatient Hospital Stay (HOSPITAL_COMMUNITY): Payer: Medicare Other

## 2013-08-20 DIAGNOSIS — I10 Essential (primary) hypertension: Secondary | ICD-10-CM

## 2013-08-20 DIAGNOSIS — Z8673 Personal history of transient ischemic attack (TIA), and cerebral infarction without residual deficits: Secondary | ICD-10-CM

## 2013-08-20 DIAGNOSIS — I4891 Unspecified atrial fibrillation: Secondary | ICD-10-CM

## 2013-08-20 DIAGNOSIS — I619 Nontraumatic intracerebral hemorrhage, unspecified: Secondary | ICD-10-CM

## 2013-08-20 MED ORDER — ASPIRIN EC 81 MG PO TBEC
81.0000 mg | DELAYED_RELEASE_TABLET | Freq: Every day | ORAL | Status: DC
  Administered 2013-08-21 – 2013-08-30 (×10): 81 mg via ORAL
  Filled 2013-08-20 (×12): qty 1

## 2013-08-20 NOTE — Progress Notes (Signed)
Subjective/Complaints: 80 y.o. left-handed male with history of atrial fibrillation, hypertension, PFO, CVA 2009 not on anticoagulations and very little residual weakness. Admitted 08/02/2013 with left-sided weakness. Cranial CT scan showed 17 x 21 hemorrhagic infarcts posterior right basal ganglia and progression of microvascular disease. His his systolic blood pressure 240/97 on arrival. Follow cranial CT scan 08/04/2013 with stable hypertensive hemorrhage that's stable and mild vasogenic edema. Patient enrolled in ATACH-II study for BP control. Neurology recommends repeat CCT in 2-3 weeks to decide on antiplatelet therapy  Asking about bladder medicine to "shrink prostate" Has seen uro as outpt, on Proscar No C/Os Review of Systems - Negative except Left side weak  Objective: Vital Signs: Blood pressure 134/62, pulse 77, temperature 97.9 F (36.6 C), temperature source Oral, resp. rate 17, height 5\' 3"  (1.6 m), weight 64.6 kg (142 lb 6.7 oz), SpO2 96.00%. No results found. No results found for this or any previous visit (from the past 72 hour(s)).    Vitals reviewed.  Constitutional: He is oriented to person, place, and time. No distress  HEENT: oral mucosa moist. Missing a few teeth  Eyes:  Pupils reactive to light  Neck: Normal range of motion. Neck supple. No thyromegaly present.  Cardiovascular:  Rate controlled, irregular rhythm, small sem  Respiratory: Effort normal and breath sounds normal. No respiratory distress. No wheezes or rales  GI: Soft. Bowel sounds are normal. He exhibits no distension.  Neurological: He is alert and oriented to person, place, and time.  Flat affect. Follows commands. Speech is mildly dysarthric but intelligible. Improving oromotor control. . LUE is grossly 4/5 severe ataxia. LLE is better at 3+/5 HF, KE and 4- ADF/APF. Absent LT LUE and LLE. Limited insight and awareness.  Skin: Skin is warm and dry.    Assessment/Plan: 1. Functional deficits  secondary to Right basal ganglia hemmorrhagic infarct which require 3+ hours per day of interdisciplinary therapy in a comprehensive inpatient rehab setting. Physiatrist is providing close team supervision and 24 hour management of active medical problems listed below. Physiatrist and rehab team continue to assess barriers to discharge/monitor patient progress toward functional and medical goals.  FIM: FIM - Bathing Bathing Steps Patient Completed: Left Arm;Chest;Right upper leg;Left upper leg;Abdomen;Front perineal area;Right lower leg (including foot);Left lower leg (including foot);Right Arm;Buttocks Bathing: 4: Steadying assist  FIM - Upper Body Dressing/Undressing Upper body dressing/undressing steps patient completed: Thread/unthread left sleeve of front closure shirt/dress;Thread/unthread right sleeve of front closure shirt/dress;Thread/unthread right sleeve of pullover shirt/dresss;Thread/unthread left sleeve of pullover shirt/dress;Put head through opening of pull over shirt/dress;Pull shirt over trunk;Pull shirt around back of front closure shirt/dress;Button/unbutton shirt Upper body dressing/undressing: 5: Supervision: Safety issues/verbal cues FIM - Lower Body Dressing/Undressing Lower body dressing/undressing steps patient completed: Thread/unthread right underwear leg;Thread/unthread left underwear leg;Thread/unthread right pants leg;Thread/unthread left pants leg;Don/Doff right shoe;Pull underwear up/down;Pull pants up/down;Don/Doff left sock;Don/Doff right sock Lower body dressing/undressing: 4: Min-Patient completed 75 plus % of tasks  FIM - Toileting Toileting steps completed by patient: Adjust clothing prior to toileting;Performs perineal hygiene;Adjust clothing after toileting Toileting Assistive Devices: Grab bar or rail for support Toileting: 4: Steadying assist  FIM - Radio producer Devices: Grab bars Toilet Transfers: 4-To toilet/BSC: Min A  (steadying Pt. > 75%);4-From toilet/BSC: Min A (steadying Pt. > 75%)  FIM - Bed/Chair Transfer Bed/Chair Transfer Assistive Devices: Arm rests Bed/Chair Transfer: 4: Bed > Chair or W/C: Min A (steadying Pt. > 75%);4: Supine > Sit: Min A (steadying Pt. > 75%/lift 1 leg)  FIM - Locomotion: Wheelchair Distance: 150 Locomotion: Wheelchair: 5: Travels 150 ft or more: maneuvers on rugs and over door sills with supervision, cueing or coaxing FIM - Locomotion: Ambulation Locomotion: Ambulation Assistive Devices: Other (comment) (R rail in hallway) Ambulation/Gait Assistance: 2: Max assist Locomotion: Ambulation: 1: Travels less than 50 ft with maximal assistance (Pt: 25 - 49%)  Comprehension Comprehension Mode: Auditory Comprehension: 5-Understands complex 90% of the time/Cues < 10% of the time  Expression Expression Mode: Verbal Expression: 5-Expresses complex 90% of the time/cues < 10% of the time  Social Interaction Social Interaction: 5-Interacts appropriately 90% of the time - Needs monitoring or encouragement for participation or interaction.  Problem Solving Problem Solving: 5-Solves basic 90% of the time/requires cueing < 10% of the time  Memory Memory: 5-Recognizes or recalls 90% of the time/requires cueing < 10% of the time   Medical Problem List and Plan:  Hemorrhagic right basal ganglia infarcts, left sensory ataxia  1. DVT Prophylaxis/Anticoagulation: Pharmaceutical: Lovenox  2. Pain Management: N/A  3. Mood: No signs of distress noted. LCSW to follow for evaluation.  4. Neuropsych: This patient is capable of making decisions on his own behalf.  5. HTN: Monitor with qid checks. Continue HCTZ, Avapro and Norvasc. Mild hypo K, supplement 6. BPH: continue proscar.   LOS (Days) 13 A FACE TO FACE EVALUATION WAS PERFORMED  KIRSTEINS,ANDREW E 08/20/2013, 8:03 AM

## 2013-08-20 NOTE — Progress Notes (Signed)
Occupational Therapy Session Note  Patient Details  Name: Jack [name] MRN: [id number] Date of Birth: 1934/01/19  Today's Date: 08/20/2013 Time: 8341-9622 Time Calculation (min): 45 min  Short Term Goals: Week 2:  OT Short Term Goal 1 (Week 2): Pt will be able to stand with min A while adjusting clothing over his hips.  OT Short Term Goal 2 (Week 2): Pt will be able to fully pull his underwear and pants over hips. OT Short Term Goal 3 (Week 2): Pt will demonstrate improved L hand control by using it to pull on his socks with mod A. OT Short Term Goal 4 (Week 2): Pt will be able to recall the steps of his safe squat pivot transfer with min cues.  Skilled Therapeutic Interventions/Progress Updates:  Patient resting in w/c upon arrival with wife at his side.  Engaged in discussion regarding home set up and options for bathing since tub/shower and walk in shower are both upstairs.  Patient and wife realize that if patient is not able to safely walk up the stairs by discharge, a bed will need to be placed in the den and patient will need to sponge bathe sitting EOB and use a BSC (maybe drop arm) for toileting.  Patient practiced sit><stand ~10 times with focus on the steps/techniques needed to perform this transition safely to include controlled transition.  Patient was supervision for sit><stand at least 75% of the time when correct techniques/steps followed.  When standing, patient simulated dynamic balance with min assist to pull pants down and up.  Patient also performed LUE coordination tasks.  Therapy Documentation Precautions:  Precautions Precautions: Fall Precaution Comments: Severely impaired LUE/LLE sensation Restrictions Weight Bearing Restrictions: No Pain: No reports of pain  Therapy/Group: Individual Therapy  Kawana Hegel 08/20/2013, 2:38 PM

## 2013-08-20 NOTE — Progress Notes (Signed)
Physical Therapy Note  Patient Details  Name: Jack [name] MRN: [id number] Date of Birth: 07/08/1934 Today's Date: 08/20/2013 0900-1000, 60 min individual therapy 1300-1330, 30 min individual therapy No pain reported  Tx 1:  neuromuscular re-education via demo, manual and tactile cues for L stance stability, wt shifting in standing, gait,pelvic dissociation in sitting for scooting  Kinetron in standing with assist for L hip and knee ext, wt shift, upright trunk, L hand control on grip.  Basic transfer with min assist for squat pivot transfer L<>R.  Gait in hallway with R rail, L AFO, working on L foot placement, wider BOS, upright trunk, L stance stability.  Pt continues to have insensate LLE.  Scooting forward/backward with wt shifting L and R, x 5 with improving pelvic dissociation.  Tx 2:  neuromuscular re-education as above for L stance stability during R hand activity in standing, gait  Standing x 5 min at sturdy table while using R hand to place checkers on upright board, mod assist for L hip and knee control; pt exhibited L inattention with placement of checkers.  With cues, pt was able to identify the inattention.  Gait with RW,  hand splint, x 8' with max assist, working on L foot placement, L hip and knee ext, anterior tibial translation, upright trunk, sequencing of Rw.  [name] 08/20/2013, 7:51 AM

## 2013-08-20 NOTE — Progress Notes (Signed)
Occupational Therapy Session Note  Patient Details  Name: Jack [name] MRN: [id number] Date of Birth: 02-02-1934  Today's Date: 08/20/2013 Time: 0805-0900 and 1000-1030 Time Calculation (min): 55 min and 30 min  Short Term Goals: Week 1:  OT Short Term Goal 1 (Week 1): Pt will be able to maintain static standing balance with min A to increase his ability to manage clothing over his hips. OT Short Term Goal 1 - Progress (Week 1): Progressing toward goal OT Short Term Goal 2 (Week 1): Pt will be able to don shirt with supervision. OT Short Term Goal 2 - Progress (Week 1): Met OT Short Term Goal 3 (Week 1): Pt will be able to don pants with mod A. OT Short Term Goal 3 - Progress (Week 1): Met OT Short Term Goal 4 (Week 1): Pt will be able to transfer to toilet with mod A x1. OT Short Term Goal 4 - Progress (Week 1): Met OT Short Term Goal 5 (Week 1): Pt will demonstrate increased functional use of his L hand by holding wash cloth and washing R arm. OT Short Term Goal 5 - Progress (Week 1): Met Week 2:  OT Short Term Goal 1 (Week 2): Pt will be able to stand with min A while adjusting clothing over his hips.  OT Short Term Goal 2 (Week 2): Pt will be able to fully pull his underwear and pants over hips. OT Short Term Goal 3 (Week 2): Pt will demonstrate improved L hand control by using it to pull on his socks with mod A. OT Short Term Goal 4 (Week 2): Pt will be able to recall the steps of his safe squat pivot transfer with min cues.  Skilled Therapeutic Interventions/Progress Updates:    Visit 1: No c/o pain.    Pt seen for BADL retraining of toileting, bathing at shower level,  and dressing with a focus on use of LUE, trunk control, and standing balance. Pt was more verbally expressive today versus just smiling or using head nods. Pt demonstrated improved progress with bed mobility (supervision L sidelying to sit), recall of steps for transfer, smoothness of trunk control with transfers, and  use of LUE as a stabilizing A in standing and standing.He weight shifted to L on toilet so he could cleanse self with S for his balance versus min A last week.  Pt was able to use L hand on grab bar in shower to stand briefly to use R hand to wash bottom. He actively used L hand to pull pants over feet and hips. He had difficulty with his socks as they were tight fitting. Pt's breakfast tray was set up for him and pt completed his breakfast.  He then stood at sink with only min a 50% of the time to support LLE from over flexion. Pt completed grooming and was resting in w/c at end of session for his next therapy session.   Visit 2: No c/o pain. Pt seen this session to facilitate LUE motor control/coordination.  Pt transferred to and from mat with SQPT with no cuing and close S.  He worked on: - dynamic reaching with weight shifting L/ R with rolling large physioball on mat, rolling ball forward on floor with a focus on pushing through ball -BUE AROM coordinating arms at the same time - BUE holding large ball to reach arms into flexion - trunk rotation to facilitate balance with reaching for and grasping/ releasing cones - FMC of lateral and tip  pinch with grasping and releasing plastic letters Pt participated extremely well, demonstrating progress with his functional coordination. His wife took him back to his room via w/c.  Therapy Documentation Precautions:  Precautions Precautions: Fall Precaution Comments: Severely impaired LUE/LLE sensation Restrictions Weight Bearing Restrictions: No  Vital Signs: Therapy Vitals BP: 134/62 mmHg Pain: Pain Assessment Pain Assessment: No/denies pain Pain Score: 0-No pain ADL:  See FIM for current functional status  Therapy/Group: Individual Therapy  [place] 08/20/2013, 10:40 AM

## 2013-08-20 NOTE — Progress Notes (Signed)
Results of CCT discussed with neuro and patient cleared to resume low dose ASA.

## 2013-08-21 ENCOUNTER — Inpatient Hospital Stay (HOSPITAL_COMMUNITY): Payer: Medicare Other | Admitting: *Deleted

## 2013-08-21 ENCOUNTER — Inpatient Hospital Stay (HOSPITAL_COMMUNITY): Payer: Medicare Other | Admitting: Occupational Therapy

## 2013-08-21 LAB — BASIC METABOLIC PANEL
BUN: 19 mg/dL (ref 6–23)
CALCIUM: 8.8 mg/dL (ref 8.4–10.5)
CO2: 28 meq/L (ref 19–32)
CREATININE: 1.01 mg/dL (ref 0.50–1.35)
Chloride: 105 mEq/L (ref 96–112)
GFR calc Af Amer: 81 mL/min — ABNORMAL LOW (ref >90)
GFR calc non Af Amer: 70 mL/min — ABNORMAL LOW (ref >90)
GLUCOSE: 97 mg/dL (ref 70–99)
Potassium: 4 mEq/L (ref 3.7–5.3)
Sodium: 145 mEq/L (ref 137–147)

## 2013-08-21 NOTE — Progress Notes (Signed)
Physical Therapy Weekly Progress Note  Patient Details  Name: Jack [name] MRN: [id number] Date of Birth: [birth year]  Today's Date: 08/21/2013 1) XLKG:[id number]  Time Calculation (min): 53 min   2)  1300-1320 Time Calculation (min): 20 min - co-treat with OT  3) 14:30-15:00 (37mn)   Patient has met 2 of 5 short term goals, but continues to make progress towards LTGs due to ability to compensate for sensation deficits, insight into deficits, improved motor control, increased graded muscle activation, and activity tolerance.   Patient continues to demonstrate the following deficits: absent LUE and LLE sensation, Impaired motor control, and impaired memory and therefore will continue to benefit from skilled PT intervention to enhance overall performance with balance, ability to compensate for deficits, functional use of  left upper extremity and left lower extremity and coordination.  Patient progressing toward long term goals.  Plan of care revisions: downgraded some LTGs due to likely progress from this point.   PT Short Term Goals Week 3:  PT Short Term Goal 1 (Week 3): Pt will perform basic transfers with supervision 3/5 trials PT Short Term Goal 2 (Week 3): Pt will ambulate 100' with Min A and LRAD PT Short Term Goal 3 (Week 3): Pt will ascend/descend 6 step swith Min A PT Short Term Goal 4 (Week 3): Pt will demonstrate dynamic standing balance with Min A during functional task PT Short Term Goal 5 (Week 3): Pt will propell WC in community setting x150' with S   Skilled Therapeutic Interventions/Progress Updates:  1:3 Tx focused on transfers, NMR, and gait in LiteGait treadmill training system.  Pt performed bed mobility with S and increased time.  Min A basic transfers with verbal cues needed for sequence, pt unable to use card effectively use cue card independently.   NMR throughout tx for L-sided attention, forced use, verbal and tactile cues for LLE activation and timing.  Seated RLE control tasks including marching and LAQ with focus on grading muscle activation.   Pt propelled WC x75' with bil LEs and 1UE with S cues for safety and continuing LLE use in straight, controlled setting.  Static standing x265m with bil UE assist while donning harness, step up with min A.   Performed pre-gait stepping for increased weight shifting and LLE stability.  Gait in Li[place]woodx2:30 and 1x3:30' for 113' and 175' with Mod A for LLE control. Pt able to independently advance LLE ~50% of the time after practice, but needed extension support at hip/knee 80% of the time. 1.52m81m>>1.8mp42m 2:3 Co-treat with OT for gait and NMR without device, using mirror for visual feedback. Donned Swedish knee cage and L Blue Rocker AFO for increased control of LLE during L step and stance. Gait training 5x30' with +2 with arms over shoulder technique for trunk control as well as LLE advancement/placement.  Pt improved grateful iover trials as he was able to retain his plan for gait sequence. Pt focusing on L swing, then "Tall" during L stance phase for safe R swing. Pt needing assist for weigth shifting, but was eventually able to continue sequence with minimal cues and self-correct errors.  Pt challenged to correct midlien orientation and balance using mirror for guide.   3:3 Tx focused on gait with RW and hand splint and stairs.  Provided demonstration for RW use and sequence. Gait 1x30' and 1x50' with RW and strong Mod A for RW management and occasional L foot placement. Pt with much improved placement with AFO and  knee cage. Pt able to self correct 50% of errors, but needed sequencing cues.  Stairs x5 with bil rails and up to Mod A for LLE placement with descent, and with cues for sequence.  Pt left up in [facility] with wife and all needs in reach, braces removed.     Therapy Documentation  Precautions:  Precautions  Precautions: Fall  Precaution Comments: Severely impaired LUE/LLE sensation   Restrictions  Weight Bearing Restrictions: No   Pain:  Pain Assessment  Pain Assessment: No/denies pain  Mobility:   Locomotion :  Ambulation  Ambulation/Gait Assistance: 3: Mod assist  Wheelchair Mobility  Distance: 75  See FIM for current functional status  Therapy/Group: Individual Therapy  Kennieth Rad, PT, DPT      Therapy Documentation Precautions:  Precautions Precautions: Fall Precaution Comments: Severely impaired LUE/LLE sensation Restrictions Weight Bearing Restrictions: No Vital Signs:   Pain: none   Locomotion : Ambulation Ambulation/Gait Assistance: 1: +2 Total assist Wheelchair Mobility Distance: 75   See FIM for current functional status  Therapy/Group: Individual Therapy and Co-Treatment  Kennieth Rad, PT, DPT  08/21/2013, 3:49 PM

## 2013-08-21 NOTE — Progress Notes (Signed)
Occupational Therapy Session Note  Patient Details  Name: Jack [name] MRN: [id number] Date of Birth: Dec 11, 1933  Today's Date: 08/21/2013 Time: 1010-1110 Time Calculation (min): 60 min  Short Term Goals: Week 1:  OT Short Term Goal 1 (Week 1): Pt will be able to maintain static standing balance with min A to increase his ability to manage clothing over his hips. OT Short Term Goal 1 - Progress (Week 1): Progressing toward goal OT Short Term Goal 2 (Week 1): Pt will be able to don shirt with supervision. OT Short Term Goal 2 - Progress (Week 1): Met OT Short Term Goal 3 (Week 1): Pt will be able to don pants with mod A. OT Short Term Goal 3 - Progress (Week 1): Met OT Short Term Goal 4 (Week 1): Pt will be able to transfer to toilet with mod A x1. OT Short Term Goal 4 - Progress (Week 1): Met OT Short Term Goal 5 (Week 1): Pt will demonstrate increased functional use of his L hand by holding wash cloth and washing R arm. OT Short Term Goal 5 - Progress (Week 1): Met Week 2:  OT Short Term Goal 1 (Week 2): Pt will be able to stand with min A while adjusting clothing over his hips.  OT Short Term Goal 2 (Week 2): Pt will be able to fully pull his underwear and pants over hips. OT Short Term Goal 3 (Week 2): Pt will demonstrate improved L hand control by using it to pull on his socks with mod A. OT Short Term Goal 4 (Week 2): Pt will be able to recall the steps of his safe squat pivot transfer with min cues.  Skilled Therapeutic Interventions/Progress Updates:    Pt seen this session for LUE neuro re-ed with a focus on hand/ finger grasping and releasing using lateral and tip pinch and gross grasp.  In the ADL kitchen, pt worked on picking up and Warden/ranger into tray, picking up jello boxes and placing them in a bowl, separating coffee filters, picking up plastic cookie cutters, and opening and closing large peanut butter jar. He is demonstrating more control with improved pinch  control, but continues to have difficulty controlling large GM movement while focusing on FM movements. Pt' wife arrived and took him back to his room at the end of the session.  Therapy Documentation Precautions:  Precautions Precautions: Fall Precaution Comments: Severely impaired LUE/LLE sensation Restrictions Weight Bearing Restrictions: No   Pain: Pain Assessment Pain Assessment: No/denies pain ADL:  See FIM for current functional status  Therapy/Group: Individual Therapy  Ryann Leavitt 08/21/2013, 11:45 AM

## 2013-08-21 NOTE — Progress Notes (Signed)
Occupational Therapy Session Note  Patient Details  Name: Jack [name] MRN: [id number] Date of Birth: 1934-03-21  Today's Date: 08/21/2013 Time: 4665-9935 Time Calculation (min): 25 min  Short Term Goals: Week 2:  OT Short Term Goal 1 (Week 2): Pt will be able to stand with min A while adjusting clothing over his hips.  OT Short Term Goal 2 (Week 2): Pt will be able to fully pull his underwear and pants over hips. OT Short Term Goal 3 (Week 2): Pt will demonstrate improved L hand control by using it to pull on his socks with mod A. OT Short Term Goal 4 (Week 2): Pt will be able to recall the steps of his safe squat pivot transfer with min cues.  Skilled Therapeutic Interventions/Progress Updates:  Co-treat with PT to address integrating neuromuscular and sensory-perceptual systems for postural control and dynamic balance during functional mobility.  Focused session on over-activity of necessary muscle groups, difficulty grading muscle activity, abnormal movement patterns, difficulty with sensory processing and integration, lateral weight shifts, adequate COG over BOS, midline orientation and lateral weight shifts.  Patient's performance improved with use of mirror and LLE braces.  See PT note for details.   Therapy Documentation Precautions:  Precautions Precautions: Fall Precaution Comments: Severely impaired LUE/LLE sensation Restrictions Weight Bearing Restrictions: No Pain: Denies pain  Therapy/Group: Co-Treatment with PT  Chue Berkovich 08/21/2013, 3:34 PM

## 2013-08-21 NOTE — Progress Notes (Signed)
Subjective/Complaints: 80 y.o. left-handed male with history of atrial fibrillation, hypertension, PFO, CVA 2009 not on anticoagulations and very little residual weakness. Admitted 08/02/2013 with left-sided weakness. Cranial CT scan showed 17 x 21 hemorrhagic infarcts posterior right basal ganglia and progression of microvascular disease. His his systolic blood pressure 817/71 on arrival. Follow cranial CT scan 08/04/2013 with stable hypertensive hemorrhage that's stable and mild vasogenic edema. Patient enrolled in ATACH-II study for BP control. Neurology recommends repeat CCT in 2-3 weeks to decide on antiplatelet therapy  Pt without new issues overnite Using Urinal for bladder Had BM last noc Review of Systems - Negative except Left side weak  Objective: Vital Signs: Blood pressure 144/83, pulse 47, temperature 98.3 F (36.8 C), temperature source Oral, resp. rate 19, height 5' 3" (1.6 m), weight 64.6 kg (142 lb 6.7 oz), SpO2 95.00%. No results found. Results for orders placed during the hospital encounter of 08/07/13 (from the past 72 hour(s))  BASIC METABOLIC PANEL     Status: Abnormal   Collection Time    08/21/13  5:31 AM      Result Value Range   Sodium 145  137 - 147 mEq/L   Potassium 4.0  3.7 - 5.3 mEq/L   Chloride 105  96 - 112 mEq/L   CO2 28  19 - 32 mEq/L   Glucose, Bld 97  70 - 99 mg/dL   BUN 19  6 - 23 mg/dL   Creatinine, Ser 1.01  0.50 - 1.35 mg/dL   Calcium 8.8  8.4 - 10.5 mg/dL   GFR calc non Af Amer 70 (*) >90 mL/min   GFR calc Af Amer 81 (*) >90 mL/min   Comment: (NOTE)     The eGFR has been calculated using the CKD EPI equation.     This calculation has not been validated in all clinical situations.     eGFR's persistently <90 mL/min signify possible Chronic Kidney     Disease.      Vitals reviewed.  Constitutional: He is oriented to person, place, and time. No distress  HEENT: oral mucosa moist. Missing a few teeth  Eyes:  Pupils reactive to light   Neck: Normal range of motion. Neck supple. No thyromegaly present.  Cardiovascular:  Rate controlled, irregular rhythm, small sem  Respiratory: Effort normal and breath sounds normal. No respiratory distress. No wheezes or rales  GI: Soft. Bowel sounds are normal. He exhibits no distension.  Neurological: He is alert and oriented to person, place, and time.  Flat affect. Follows commands. Speech is mildly dysarthric but intelligible. Improving oromotor control. . LUE is grossly 4/5 severe ataxia. LLE is better at 3+/5 HF, KE and 4- ADF/APF. Reduced LT LUE and LLE. Limited insight and awareness.  Skin: Skin is warm and dry.    Assessment/Plan: 1. Functional deficits secondary to Right basal ganglia hemmorrhagic infarct which require 3+ hours per day of interdisciplinary therapy in a comprehensive inpatient rehab setting. Physiatrist is providing close team supervision and 24 hour management of active medical problems listed below. Physiatrist and rehab team continue to assess barriers to discharge/monitor patient progress toward functional and medical goals.   FIM: FIM - Bathing Bathing Steps Patient Completed: Left Arm;Chest;Right upper leg;Left upper leg;Abdomen;Front perineal area;Right lower leg (including foot);Left lower leg (including foot);Right Arm;Buttocks Bathing: 4: Steadying assist  FIM - Upper Body Dressing/Undressing Upper body dressing/undressing steps patient completed: Thread/unthread left sleeve of front closure shirt/dress;Thread/unthread right sleeve of front closure shirt/dress;Thread/unthread right sleeve of pullover shirt/dresss;Thread/unthread  left sleeve of pullover shirt/dress;Put head through opening of pull over shirt/dress;Pull shirt over trunk;Pull shirt around back of front closure shirt/dress;Button/unbutton shirt Upper body dressing/undressing: 4: Min-Patient completed 75 plus % of tasks FIM - Lower Body Dressing/Undressing Lower body dressing/undressing  steps patient completed: Thread/unthread right underwear leg;Thread/unthread left underwear leg;Thread/unthread right pants leg;Thread/unthread left pants leg;Don/Doff right shoe;Pull underwear up/down;Pull pants up/down Lower body dressing/undressing: 3: Mod-Patient completed 50-74% of tasks  FIM - Toileting Toileting steps completed by patient: Adjust clothing prior to toileting;Performs perineal hygiene;Adjust clothing after toileting Toileting Assistive Devices: Grab bar or rail for support Toileting: 4: Steadying assist  FIM - Radio producer Devices: Grab bars Toilet Transfers: 4-To toilet/BSC: Min A (steadying Pt. > 75%);4-From toilet/BSC: Min A (steadying Pt. > 75%)  FIM - Bed/Chair Transfer Bed/Chair Transfer Assistive Devices: Arm rests Bed/Chair Transfer: 4: Chair or W/C > Bed: Min A (steadying Pt. > 75%);4: Bed > Chair or W/C: Min A (steadying Pt. > 75%)  FIM - Locomotion: Wheelchair Distance: 100 (using bil LEs) Locomotion: Wheelchair: 2: Travels 50 - 149 ft with supervision, cueing or coaxing FIM - Locomotion: Ambulation Locomotion: Ambulation Assistive Devices: Other (comment);Orthosis (railing in hall) Ambulation/Gait Assistance: 2: Max assist Locomotion: Ambulation: 1: Travels less than 50 ft with maximal assistance (Pt: 25 - 49%)  Comprehension Comprehension Mode: Auditory Comprehension: 6-Follows complex conversation/direction: With extra time/assistive device  Expression Expression Mode: Verbal Expression: 6-Expresses complex ideas: With extra time/assistive device  Social Interaction Social Interaction: 6-Interacts appropriately with others with medication or extra time (anti-anxiety, antidepressant).  Problem Solving Problem Solving: 5-Solves basic 90% of the time/requires cueing < 10% of the time  Memory Memory: 5-Recognizes or recalls 90% of the time/requires cueing < 10% of the time   Medical Problem List and Plan:   Hemorrhagic right basal ganglia infarcts, left sensory ataxia  1. DVT Prophylaxis/Anticoagulation: Pharmaceutical: Lovenox  2. Pain Management: N/A  3. Mood: No signs of distress noted. LCSW to follow for evaluation.  4. Neuropsych: This patient is capable of making decisions on his own behalf.  5. HTN: Monitor with qid checks. Continue HCTZ, Avapro and Norvasc. Mild hypo K, supplement 6. BPH: continue proscar.   LOS (Days) 14 A FACE TO FACE EVALUATION WAS PERFORMED  Astor Gentle E 08/21/2013, 7:43 AM

## 2013-08-22 ENCOUNTER — Inpatient Hospital Stay (HOSPITAL_COMMUNITY): Payer: Medicare Other

## 2013-08-22 ENCOUNTER — Inpatient Hospital Stay (HOSPITAL_COMMUNITY): Payer: Medicare Other | Admitting: Occupational Therapy

## 2013-08-22 NOTE — Patient Care Conference (Signed)
Inpatient RehabilitationTeam Conference and Plan of Care Update Date: 08/22/2013   Time: 10;30 Am    Patient Name: Jack [name]      Medical Record Number: [id number]  Date of Birth: 1933-12-26 Sex: Male         Room/Bed: 4M09C/4M09C-01 Payor Info: Payor: Marine scientist / Plan: AARP MEDICARE COMPLETE / Product Type: *No Product type* /    Admitting Diagnosis: CVA  Admit Date/Time:  08/07/2013  6:17 PM Admission Comments: No comment available   Primary Diagnosis:  Bleeding in brain due to blood pressure disorder Principal Problem: Bleeding in brain due to blood pressure disorder  Patient Active Problem List   Diagnosis Date Noted  . Bleeding in brain due to blood pressure disorder 08/07/2013  . ICH (intracerebral hemorrhage) 08/02/2013  . Acute hemorrhagic infarction of brain 08/02/2013  . Hx of mitral valve repair   . History of stroke   . Sleep apnea 07/02/2011  . History of atrial fibrillation   . Cyst and pseudocyst of pancreas 09/13/2007  . Hypertension   . Premature ventricular contraction   . Hiatal hernia   . History of renal cyst   . BPH (benign prostatic hypertrophy)     Expected Discharge Date: Expected Discharge Date: 08/30/13  Team Members Present: Physician leading conference: Dr. Alysia Penna Social Worker Present: Ovidio Kin, LCSW;Jenny Prevatt, LCSW Nurse Present: Other (comment) Soyla Murphy) PT Present: Georjean Mode, PT;Other (comment) Jilda Roche) OT Present: Gareth Morgan, OT;Kris Leia Alf, OT SLP Present: Germain Osgood, SLP PPS Coordinator present : Daiva Nakayama, RN, CRRN     Current Status/Progress Goal Weekly Team Focus  Medical   sensory ataxia improving  minimize deficits, maintian medical stability  family ed   Bowel/Bladder   continent of bowel and bladder  remain continent of bowel and bladder  maintain current regimen   Swallow/Nutrition/ Hydration     WFL        ADL's   steady A bathing, s/u grooming  and UB dressing, min A LB dressing and toileting, close S with squat pivot transfers, improving LUE function  supervision overall  ADL retraining, fam ed, LUE neuro re-ed, functional mobility, L side awareness   Mobility   Min A basic transfers, S WC mobility x150,' Mod A gait and stairs  S transfers and bed mob, Min A gait and stairs, S WC in community   Transfer training, gait training with device, stairs with 1 rail, family ed, community access   Communication     [facility]        Safety/Cognition/ Behavioral Observations    No unsafe behaviors        Pain   No complaints of pain  Less than 3  Assess for pain q shift and prn   Skin   Rash to back-mircoguard powder in use  remain free of infection/breakdown  Assess skin q shift and prn      *See Care Plan and progress notes for long and short-term goals.  Barriers to Discharge: still needs physical assist    Possible Resolutions to Barriers:  orthotic management    Discharge Planning/Teaching Needs:  Home with wife who will be provide care and has been here to particiapte in therapies with pt      Team Discussion:  Progressing in therapies and turning the corner now.  On-going family education with wife.  AFO consult.  Rash better. Encourage wife to give pt commands which is a switch for their relationship  Revisions to  Treatment Plan:  On track for discharge 2/19   Continued Need for Acute Rehabilitation Level of Care: The patient requires daily medical management by a physician with specialized training in physical medicine and rehabilitation for the following conditions: Daily direction of a multidisciplinary physical rehabilitation program to ensure safe treatment while eliciting the highest outcome that is of practical value to the patient.: Yes Daily medical management of patient stability for increased activity during participation in an intensive rehabilitation regime.: Yes Daily analysis of laboratory values and/or radiology  reports with any subsequent need for medication adjustment of medical intervention for : Neurological problems  Lakendra Helling, Gardiner Rhyme 08/22/2013, 12:53 PM

## 2013-08-22 NOTE — Progress Notes (Signed)
Occupational Therapy Session Note  Patient Details  Name: Jack [name] MRN: [id number] Date of Birth: 19-Jan-1934  Today's Date: 08/22/2013 Time: 1006-1100 Time Calculation (min): 54 min  Short Term Goals: Week 1:  OT Short Term Goal 1 (Week 1): Pt will be able to maintain static standing balance with min A to increase his ability to manage clothing over his hips. OT Short Term Goal 1 - Progress (Week 1): Progressing toward goal OT Short Term Goal 2 (Week 1): Pt will be able to don shirt with supervision. OT Short Term Goal 2 - Progress (Week 1): Met OT Short Term Goal 3 (Week 1): Pt will be able to don pants with mod A. OT Short Term Goal 3 - Progress (Week 1): Met OT Short Term Goal 4 (Week 1): Pt will be able to transfer to toilet with mod A x1. OT Short Term Goal 4 - Progress (Week 1): Met OT Short Term Goal 5 (Week 1): Pt will demonstrate increased functional use of his L hand by holding wash cloth and washing R arm. OT Short Term Goal 5 - Progress (Week 1): Met Week 2:  OT Short Term Goal 1 (Week 2): Pt will be able to stand with min A while adjusting clothing over his hips.  OT Short Term Goal 2 (Week 2): Pt will be able to fully pull his underwear and pants over hips. OT Short Term Goal 3 (Week 2): Pt will demonstrate improved L hand control by using it to pull on his socks with mod A. OT Short Term Goal 4 (Week 2): Pt will be able to recall the steps of his safe squat pivot transfer with min cues.  Skilled Therapeutic Interventions/Progress Updates:    Pt seen for ADL retraining of B/D at shower level with a focus on dynamic balance, use of LUE, and problem solving.  Pt was able to complete his shower transfers with close S using grab bar. Today he demonstrated improved standing balance during LB dressing as he only needed slight steadying assist.  Pt has improved L hand motor control and coordination with donning socks and shoes. Once self care was completed, he stood with min A and  cues (tactile) to extend knee 50% of time.  In standing pt worked on folding wash cloths and opening various containers using L hand. Pt was able to open some containers without difficulty, such as his deoderant. The press top lotion was more difficult.  Pt was given arom exercises for hand focusing on thumb isolation.  Pt resting in room with his wife at the end of the session.  Therapy Documentation Precautions:  Precautions Precautions: Fall Precaution Comments: Severely impaired LUE/LLE sensation Restrictions Weight Bearing Restrictions: No    Vital Signs: Therapy Vitals BP: 160/88 mmHg Pain: Pain Assessment Pain Assessment: No/denies pain ADL:  See FIM for current functional status  Therapy/Group: Individual Therapy  SAGUIER,JULIA 08/22/2013, 11:28 AM

## 2013-08-22 NOTE — Progress Notes (Signed)
Physical Therapy Note  Patient Details  Name: Jack [name] MRN: [id number] Date of Birth: 04-Feb-1934 Today's Date: 08/22/2013 0800-0900, 60 min individual therapy 1115-1200, 45 min individual therapy 1435-1505, 30 min individual therapy  No pain reported during any tx  Tx 1:  W/c>< Toilet transfer in room, wearing L AFO; min guard assist using wall bars; toileting with assist.  neuromuscular re-education via forced use, manual cues, VCs for dynamic standing balance during hand hygiene at sink in room; L stance stability and L foot placement during gait with RW L hand splint, x 35', Kinetron in sitting working on L knee alignment over L foot at 40 cm/sec x 5 minutes  Simulated car transfer with min assist, mod cues for sequence and safety.  Family ed with wife for simulated car transfer.  Tx 2:  Pt's wife performed 2 basic transfers with min/mod assist.  Wife is helpful with cues, but pt does not respond, possibly due to memory problems, and wife does not correct him.  neuromuscular re-education via demo, VCs, tactile and manual cues: gait in hall with railing on R, mod/max assist x 30' forward, 20' backward; with RW L hand splint, L knee hyperext brace and L Blue Rocker AFO, x 30'; wt shifting in squatting position during R or L hand retrieval task  W/c>< mat with min assist, max cues for squat pivot  Tx 3:  neuromuscular re-education via demo, VCs, manual cues for LLE motor control during bil LE coordination exs in sitting, up/down 5 steps x 2, gait with RW.  HEP issued for supine general LLE exs including heel slides, ankle circles, 1/2 bridging, hip abd, short arc knee ext-  with emphasis on graded movements, slow and controlled, with assistance from wife.  Gait on steps x 2, with mod assist for LLE control and balance, wearing L AFO , max VCs and tactile cues.  Wife provided VCs for pt for sequencing, with assistance.  Gait with RW, L hand splint, L AFO x 10' with mod/max assist.   LLE fatigue limited distance.    Wilborn Membreno 08/22/2013, 11:54 AM

## 2013-08-22 NOTE — Progress Notes (Signed)
Subjective/Complaints: 80 y.o. left-handed male with history of atrial fibrillation, hypertension, PFO, CVA 2009 not on anticoagulations and very little residual weakness. Admitted 08/02/2013 with left-sided weakness. Cranial CT scan showed 17 x 21 hemorrhagic infarcts posterior right basal ganglia and progression of microvascular disease. His his systolic blood pressure 253/66 on arrival. Follow cranial CT scan 08/04/2013 with stable hypertensive hemorrhage that's stable and mild vasogenic edema. Patient enrolled in ATACH-II study for BP control. Neurology recommends repeat CCT in 2-3 weeks to decide on antiplatelet therapy  No c/os Using AFO with PT Review of Systems - Negative except Left side weak  Objective: Vital Signs: Blood pressure 160/88, pulse 74, temperature 97.9 F (36.6 C), temperature source Oral, resp. rate 18, height '5\' 3"'  (1.6 m), weight 64.6 kg (142 lb 6.7 oz), SpO2 96.00%. No results found. Results for orders placed during the hospital encounter of 08/07/13 (from the past 72 hour(s))  BASIC METABOLIC PANEL     Status: Abnormal   Collection Time    08/21/13  5:31 AM      Result Value Ref Range   Sodium 145  137 - 147 mEq/L   Potassium 4.0  3.7 - 5.3 mEq/L   Chloride 105  96 - 112 mEq/L   CO2 28  19 - 32 mEq/L   Glucose, Bld 97  70 - 99 mg/dL   BUN 19  6 - 23 mg/dL   Creatinine, Ser 1.01  0.50 - 1.35 mg/dL   Calcium 8.8  8.4 - 10.5 mg/dL   GFR calc non Af Amer 70 (*) >90 mL/min   GFR calc Af Amer 81 (*) >90 mL/min   Comment: (NOTE)     The eGFR has been calculated using the CKD EPI equation.     This calculation has not been validated in all clinical situations.     eGFR's persistently <90 mL/min signify possible Chronic Kidney     Disease.      Vitals reviewed.  Constitutional: He is oriented to person, place, and time. No distress  HEENT: oral mucosa moist. Missing a few teeth  Eyes:  Pupils reactive to light  Neck: Normal range of motion. Neck supple. No  thyromegaly present.  Cardiovascular:  Rate controlled, irregular rhythm, small sem  Respiratory: Effort normal and breath sounds normal. No respiratory distress. No wheezes or rales  GI: Soft. Bowel sounds are normal. He exhibits no distension.  Neurological: He is alert and oriented to person, place, and time.  Flat affect. Follows commands. Speech is mildly dysarthric but intelligible. Improving oromotor control. . LUE is grossly 4/5 severe ataxia. LLE is better at 3+/5 HF, KE and 4- ADF/APF. Reduced LT LUE and LLE. Limited insight and awareness.  Skin: Skin is warm and dry.    Assessment/Plan: 1. Functional deficits secondary to Right basal ganglia hemmorrhagic infarct which require 3+ hours per day of interdisciplinary therapy in a comprehensive inpatient rehab setting. Physiatrist is providing close team supervision and 24 hour management of active medical problems listed below. Physiatrist and rehab team continue to assess barriers to discharge/monitor patient progress toward functional and medical goals.  Team conference today please see physician documentation under team conference tab, met with team face-to-face to discuss problems,progress, and goals. Formulized individual treatment plan based on medical history, underlying problem and comorbidities. FIM: FIM - Bathing Bathing Steps Patient Completed: Left Arm;Chest;Right upper leg;Left upper leg;Abdomen;Front perineal area;Right lower leg (including foot);Left lower leg (including foot);Right Arm;Buttocks Bathing: 4: Steadying assist  FIM - Upper Body  Dressing/Undressing Upper body dressing/undressing steps patient completed: Thread/unthread left sleeve of front closure shirt/dress;Thread/unthread right sleeve of front closure shirt/dress;Thread/unthread right sleeve of pullover shirt/dresss;Thread/unthread left sleeve of pullover shirt/dress;Put head through opening of pull over shirt/dress;Pull shirt over trunk;Pull shirt around  back of front closure shirt/dress;Button/unbutton shirt Upper body dressing/undressing: 4: Min-Patient completed 75 plus % of tasks FIM - Lower Body Dressing/Undressing Lower body dressing/undressing steps patient completed: Thread/unthread right underwear leg;Thread/unthread left underwear leg;Thread/unthread right pants leg;Thread/unthread left pants leg;Don/Doff right shoe;Pull underwear up/down;Pull pants up/down Lower body dressing/undressing: 3: Mod-Patient completed 50-74% of tasks  FIM - Toileting Toileting steps completed by patient: Adjust clothing prior to toileting;Performs perineal hygiene;Adjust clothing after toileting Toileting Assistive Devices: Grab bar or rail for support Toileting: 4: Steadying assist  FIM - Radio producer Devices: Grab bars Toilet Transfers: 4-To toilet/BSC: Min A (steadying Pt. > 75%);4-From toilet/BSC: Min A (steadying Pt. > 75%)  FIM - Bed/Chair Transfer Bed/Chair Transfer Assistive Devices: Arm rests Bed/Chair Transfer: 5: Supine > Sit: Supervision (verbal cues/safety issues);4: Bed > Chair or W/C: Min A (steadying Pt. > 75%)  FIM - Locomotion: Wheelchair Distance: 75 Locomotion: Wheelchair: 1: Total Assistance/staff pushes wheelchair (Pt<25%) FIM - Locomotion: Ambulation Locomotion: Ambulation Assistive Devices: Orthosis (Knee cage and AFO) Ambulation/Gait Assistance: 1: +2 Total assist Locomotion: Ambulation: 1: Two helpers  Comprehension Comprehension Mode: Auditory Comprehension: 6-Follows complex conversation/direction: With extra time/assistive device  Expression Expression Mode: Verbal Expression: 6-Expresses complex ideas: With extra time/assistive device  Social Interaction Social Interaction: 6-Interacts appropriately with others with medication or extra time (anti-anxiety, antidepressant).  Problem Solving Problem Solving: 5-Solves basic 90% of the time/requires cueing < 10% of the  time  Memory Memory: 5-Recognizes or recalls 90% of the time/requires cueing < 10% of the time   Medical Problem List and Plan:  Hemorrhagic right basal ganglia infarcts, left sensory ataxia improving 1. DVT Prophylaxis/Anticoagulation: Pharmaceutical: Lovenox  2. Pain Management: N/A  3. Mood: No signs of distress noted. LCSW to follow for evaluation.  4. Neuropsych: This patient is capable of making decisions on his own behalf.  5. HTN: Monitor with qid checks. Continue HCTZ, Avapro and Norvasc. Mild hypo K, supplement 6. BPH: continue proscar.   LOS (Days) 15 A FACE TO FACE EVALUATION WAS PERFORMED  KIRSTEINS,ANDREW E 08/22/2013, 8:22 AM

## 2013-08-23 ENCOUNTER — Inpatient Hospital Stay (HOSPITAL_COMMUNITY): Payer: Medicare Other | Admitting: Physical Therapy

## 2013-08-23 ENCOUNTER — Inpatient Hospital Stay (HOSPITAL_COMMUNITY): Payer: Medicare Other

## 2013-08-23 ENCOUNTER — Inpatient Hospital Stay (HOSPITAL_COMMUNITY): Payer: Medicare Other | Admitting: Occupational Therapy

## 2013-08-23 NOTE — Progress Notes (Signed)
Occupational Therapy Session Note  Patient Details  Name: Jack [name] MRN: [id number] Date of Birth: 12/17/1933  Today's Date: 08/23/2013 Time: 1010-1110 Time Calculation (min): 60 min  Short Term Goals: Week 1:  OT Short Term Goal 1 (Week 1): Pt will be able to maintain static standing balance with min A to increase his ability to manage clothing over his hips. OT Short Term Goal 1 - Progress (Week 1): Progressing toward goal OT Short Term Goal 2 (Week 1): Pt will be able to don shirt with supervision. OT Short Term Goal 2 - Progress (Week 1): Met OT Short Term Goal 3 (Week 1): Pt will be able to don pants with mod A. OT Short Term Goal 3 - Progress (Week 1): Met OT Short Term Goal 4 (Week 1): Pt will be able to transfer to toilet with mod A x1. OT Short Term Goal 4 - Progress (Week 1): Met OT Short Term Goal 5 (Week 1): Pt will demonstrate increased functional use of his L hand by holding wash cloth and washing R arm. OT Short Term Goal 5 - Progress (Week 1): Met Week 2:  OT Short Term Goal 1 (Week 2): Pt will be able to stand with min A while adjusting clothing over his hips.  OT Short Term Goal 2 (Week 2): Pt will be able to fully pull his underwear and pants over hips. OT Short Term Goal 3 (Week 2): Pt will demonstrate improved L hand control by using it to pull on his socks with mod A. OT Short Term Goal 4 (Week 2): Pt will be able to recall the steps of his safe squat pivot transfer with min cues.  Skilled Therapeutic Interventions/Progress Updates:      Pt seen for BADL retraining of toileting, bathing, and dressing with a focus on safe transfer techniques with family education with his wife, LUE re-ed through forced use, and dynamic balance. His wife actively assisted in the transfers.  Pt needed steadying A with toilet transfers, but was then able to transfer to shower using bars with S.  He is demonstrating a great deal of progress with standing during clothing management and use  of L hand. He continues to need cues for head/hip positioning with transfers and cues to use L hand. He can now use that hand to wash most of his body, pull up his pants, and pull on his socks.  Pt worked on w/c to bed to arm chair to w/c transfers with RW with min-mod A as he had difficulty with safe LLE placement. Pt stood for 5-8 minutes with supervision using RW to engage in a conversation about his past vacations.  Pt resting in wc at end of session.  Therapy Documentation Precautions:  Precautions Precautions: Fall Precaution Comments: Severely impaired LUE/LLE sensation Restrictions Weight Bearing Restrictions: No   Pain: Pain Assessment Pain Assessment: No/denies pain ADL:  See FIM for current functional status  Therapy/Group: Individual Therapy  , 08/23/2013, 11:40 AM  

## 2013-08-23 NOTE — Progress Notes (Signed)
Physical Therapy Note  Patient Details  Name: Jack [name] MRN: [id number] Date of Birth: 1933-09-09 Today's Date: 08/23/2013  11:15 - 12:05 50 minutes Individual session Patient denies pain.  Treatment session focused on gait training with prosthetist assessment for ankle and knee control on left. Patient ambulated 85 feet x 2 and 150 feet x 1 with mod assist. Determined medium blue rocker with 3/8 inch wedge under heel was best option for patient. Patient demonstrates good active movement of knee flexion/extension and ankle dorsiflexion/plantarflexion. Patient with decreased sensation/proprioception creating problems with gait pattern. Patient transferred to toilet with min assist. Patient able to perform toileting tasks (pants down, hygiene, and pants up) with min steady assist. Patient left in room seating in wheelchair eating lunch. Items in reach.   Elder Love M 08/23/2013, 12:09 PM

## 2013-08-23 NOTE — Progress Notes (Signed)
Physical Therapy Session Note  Patient Details  Name: Jack [name] MRN: [id number] Date of Birth: October 19, 1933  Today's Date: 08/23/2013 Time: 1700-1749 Time Calculation (min): 40 min  Short Term Goals: Week 3:  PT Short Term Goal 1 (Week 3): Pt will perform basic transfers with supervision 3/5 trials PT Short Term Goal 2 (Week 3): Pt will ambulate 100' with Min A and LRAD PT Short Term Goal 3 (Week 3): Pt will ascend/descend 6 step swith Min A PT Short Term Goal 4 (Week 3): Pt will demonstrate dynamic standing balance with Min A during functional task PT Short Term Goal 5 (Week 3): Pt will propell WC in community setting x150' with S  Skilled Therapeutic Interventions/Progress Updates:    Pt received seated in w/c with wife present. Pt agreeable to therapy. Per request of pt/wife to change pt's pants, performed squat pivot from w/c<>bed with min A, sit<>supine with supervision, bed rail, HOB flat. LB dressing performed in supine. Donned L blue rocker AFO. Once repositioned in w/c, pt performed self-propulsion of w/c x100' in controlled and home environment using R hemi-technique with supervision. In day room, performed sit<>stand from w/c x2 trials with close supervision.  During dynamic standing balance activities, pt performed functional RUE task x2 trials with intermittent RUE support at table. Initial task involved pt scooping fruit onto plate x4 minutes with close supervision to min guard for stability/balance. Second task involved pt performing anterolateral reaching with RUE, writing on greeting card x7 minutes with min A for balance.   Performed gait x39' in controlled environment with rolling walker, L hand orthosis, mod A and manual stabilization at lateral aspect of LLE; mod verbal cueing to increase RLE step length and for positioning/movement of rolling walker. Transported pt to room in w/c, where pt was left seated in w/c with wife present and all needs within reach.  Therapy  Documentation Precautions:  Precautions Precautions: Fall Precaution Comments: Severely impaired LUE/LLE sensation Restrictions Weight Bearing Restrictions: No Vital Signs: Therapy Vitals Temp: 97.3 F (36.3 C) Temp src: Oral Pulse Rate: 80 Resp: 18 BP: 115/71 mmHg Patient Position, if appropriate: Sitting Oxygen Therapy SpO2: 96 % O2 Device: None (Room air) Pain: Pain Assessment Pain Assessment: No/denies pain Pain Score: 0-No pain Locomotion : Ambulation Ambulation/Gait Assistance: 3: Mod assist Wheelchair Mobility Distance: 100   See FIM for current functional status  Therapy/Group: Individual Therapy  Stefano Gaul 08/23/2013, 6:35 PM

## 2013-08-23 NOTE — Progress Notes (Signed)
Physical Therapy Note  Patient Details  Name: Jack [name] MRN: [id number] Date of Birth: Sep 26, 1933 Today's Date: 08/23/2013  8:00 - 9:00 60 minutes Individual session Patient denies pain.  Patient sitting up in bed finishing breakfast upon entering room. Patient supine to sit with [facility] raised with supervision. Patient transferred bed to wheelchair squat pivot with min steady assist. Patient requesting to use bathroom. Patient transferred wheelchair to toilet using grab bar and min assist. Patient required assist to adjust clothes before and after BM. Patient performed hygiene. Patient washed hands at sink independently. Patient propelled wheelchair 150 feet using hemi technique with supervision. Patient had 1 incidence of running into object on left - he self corrected. Patient worked in apartment on wheelchair mobility on carpet, wheelchair set up for transfer, transfers to and from bed and bed mobility. Patient performed all with close supervision to occasional min cueing. Patient had some decrease control of descents into wheelchair when going to right. Patient worked on Dance movement psychotherapist going in both directions and working on controlled descent. Patient ambulated up and down 5 steps x 2 with 1 rail and min to mod assist. Patient required occasional cueing for sequencing. Wife reports that rail is on 1 side at one set of steps and on the other at another set of steps. Patient does min assist when able to hold on with right UE and mod assist when using left UE on rail. Patient ambulated with rolling walker, hand orthosis, and left AFO 85 feet with mod assist. Patient requires facilitation and cueing for hip extension during stance on left and to increase stride on right. Patient left in wheelchair in room with all items in place and wife present.   Elder Love M 08/23/2013, 9:54 AM

## 2013-08-23 NOTE — Progress Notes (Signed)
Subjective/Complaints: 80 y.o. left-handed male with history of atrial fibrillation, hypertension, PFO, CVA 2009 not on anticoagulations and very little residual weakness. Admitted 08/02/2013 with left-sided weakness. Cranial CT scan showed 17 x 21 hemorrhagic infarcts posterior right basal ganglia and progression of microvascular disease. His his systolic blood pressure 628/36 on arrival. Follow cranial CT scan 08/04/2013 with stable hypertensive hemorrhage that's stable and mild vasogenic edema. Patient enrolled in ATACH-II study for BP control. Neurology recommends repeat CCT in 2-3 weeks to decide on antiplatelet therapy  No c/os Using AFO with PT, Orthotist to eval today Review of Systems - Negative except Left side weak  Objective: Vital Signs: Blood pressure 158/83, pulse 86, temperature 97.7 F (36.5 C), temperature source Oral, resp. rate 19, height _0  (1.6 m), weight 64.6 kg (142 lb 6.7 oz), SpO2 98.00%. No results found. Results for orders placed during the hospital encounter of 08/07/13 (from the past 72 hour(s))  BASIC METABOLIC PANEL     Status: Abnormal   Collection Time    08/21/13  5:31 AM      Result Value Ref Range   Sodium 145  137 - 147 mEq/L   Potassium 4.0  3.7 - 5.3 mEq/L   Chloride 105  96 - 112 mEq/L   CO2 28  19 - 32 mEq/L   Glucose, Bld 97  70 - 99 mg/dL   BUN 19  6 - 23 mg/dL   Creatinine, Ser 1.01  0.50 - 1.35 mg/dL   Calcium 8.8  8.4 - 10.5 mg/dL   GFR calc non Af Amer 70 (*) >90 mL/min   GFR calc Af Amer 81 (*) >90 mL/min   Comment: (NOTE)     The eGFR has been calculated using the CKD EPI equation.     This calculation has not been validated in all clinical situations.     eGFR's persistently <90 mL/min signify possible Chronic Kidney     Disease.      Vitals reviewed.  Constitutional: He is oriented to person, place, and time. No distress  HEENT: oral mucosa moist. Missing a few teeth  Eyes:  Pupils reactive to light  Neck: Normal range  of motion. Neck supple. No thyromegaly present.  Cardiovascular:  Rate controlled, irregular rhythm, small sem  Respiratory: Effort normal and breath sounds normal. No respiratory distress. No wheezes or rales  GI: Soft. Bowel sounds are normal. He exhibits no distension.  Neurological: He is alert and oriented to person, place, and time.  Flat affect. Follows commands. Speech is mildly dysarthric but intelligible. Improving oromotor control. . LUE is grossly 4/5 severe ataxia. LLE is better at 3+/5 HF, KE and 4- ADF/APF. Reduced LT LUE and LLE. Limited insight and awareness.  Skin: Skin is warm and dry.    Assessment/Plan: 1. Functional deficits secondary to Right basal ganglia hemmorrhagic infarct which require 3+ hours per day of interdisciplinary therapy in a comprehensive inpatient rehab setting. Physiatrist is providing close team supervision and 24 hour management of active medical problems listed below. Physiatrist and rehab team continue to assess barriers to discharge/monitor patient progress toward functional and medical goals.  Needs additional Family training FIM: FIM - Bathing Bathing Steps Patient Completed: Left Arm;Chest;Right upper leg;Left upper leg;Abdomen;Front perineal area;Right lower leg (including foot);Left lower leg (including foot);Right Arm;Buttocks Bathing: 4: Steadying assist  FIM - Upper Body Dressing/Undressing Upper body dressing/undressing steps patient completed: Thread/unthread left sleeve of front closure shirt/dress;Thread/unthread right sleeve of front closure shirt/dress;Thread/unthread right sleeve of pullover  shirt/dresss;Thread/unthread left sleeve of pullover shirt/dress;Put head through opening of pull over shirt/dress;Pull shirt over trunk;Pull shirt around back of front closure shirt/dress;Button/unbutton shirt Upper body dressing/undressing: 4: Min-Patient completed 75 plus % of tasks FIM - Lower Body Dressing/Undressing Lower body  dressing/undressing steps patient completed: Thread/unthread right underwear leg;Thread/unthread left underwear leg;Thread/unthread right pants leg;Thread/unthread left pants leg;Don/Doff right shoe;Pull underwear up/down;Pull pants up/down;Don/Doff right sock;Don/Doff left sock;Don/Doff left shoe Lower body dressing/undressing: 4: Steadying Assist  FIM - Toileting Toileting steps completed by patient: Adjust clothing prior to toileting;Performs perineal hygiene Toileting Assistive Devices: Grab bar or rail for support Toileting: 3: Mod-Patient completed 2 of 3 steps  FIM - Radio producer Devices: Grab bars Toilet Transfers: 4-To toilet/BSC: Min A (steadying Pt. > 75%);4-From toilet/BSC: Min A (steadying Pt. > 75%)  FIM - Bed/Chair Transfer Bed/Chair Transfer Assistive Devices: Arm rests Bed/Chair Transfer: 4: Sit > Supine: Min A (steadying pt. > 75%/lift 1 leg);4: Bed > Chair or W/C: Min A (steadying Pt. > 75%)  FIM - Locomotion: Wheelchair Distance: 75 Locomotion: Wheelchair: 1: Total Assistance/staff pushes wheelchair (Pt<25%) FIM - Locomotion: Ambulation Locomotion: Ambulation Assistive Devices: Orthosis;Walker - Rolling (L knee cage, L AFO, L hand splint) Ambulation/Gait Assistance: 2: Max assist Locomotion: Ambulation: 1: Travels less than 50 ft with maximal assistance (Pt: 25 - 49%)  Comprehension Comprehension Mode: Auditory Comprehension: 6-Follows complex conversation/direction: With extra time/assistive device  Expression Expression Mode: Verbal Expression: 6-Expresses complex ideas: With extra time/assistive device  Social Interaction Social Interaction: 6-Interacts appropriately with others with medication or extra time (anti-anxiety, antidepressant).  Problem Solving Problem Solving: 5-Solves basic 90% of the time/requires cueing < 10% of the time  Memory Memory: 5-Recognizes or recalls 90% of the time/requires cueing < 10% of the  time   Medical Problem List and Plan:  Hemorrhagic right basal ganglia infarcts, left sensory ataxia improving 1. DVT Prophylaxis/Anticoagulation: Pharmaceutical: Lovenox  2. Pain Management: N/A  3. Mood: No signs of distress noted. LCSW to follow for evaluation.  4. Neuropsych: This patient is capable of making decisions on his own behalf.  5. HTN: Monitor with qid checks. Continue HCTZ, Avapro and Norvasc. Mild hypo K, supplement 6. BPH: continue proscar.   LOS (Days) [place]  KIRSTEINS,ANDREW E 08/23/2013, 8:42 AM

## 2013-08-24 ENCOUNTER — Inpatient Hospital Stay (HOSPITAL_COMMUNITY): Payer: Medicare Other | Admitting: Occupational Therapy

## 2013-08-24 ENCOUNTER — Encounter (HOSPITAL_COMMUNITY): Payer: Medicare Other | Admitting: Occupational Therapy

## 2013-08-24 ENCOUNTER — Inpatient Hospital Stay (HOSPITAL_COMMUNITY): Payer: Medicare Other | Admitting: *Deleted

## 2013-08-24 DIAGNOSIS — I10 Essential (primary) hypertension: Secondary | ICD-10-CM

## 2013-08-24 DIAGNOSIS — Z8673 Personal history of transient ischemic attack (TIA), and cerebral infarction without residual deficits: Secondary | ICD-10-CM

## 2013-08-24 DIAGNOSIS — I4891 Unspecified atrial fibrillation: Secondary | ICD-10-CM

## 2013-08-24 DIAGNOSIS — I619 Nontraumatic intracerebral hemorrhage, unspecified: Secondary | ICD-10-CM

## 2013-08-24 NOTE — Progress Notes (Signed)
Subjective/Complaints: 80 y.o. left-handed male with history of atrial fibrillation, hypertension, PFO, CVA 2009 not on anticoagulations and very little residual weakness. Admitted 08/02/2013 with left-sided weakness. Cranial CT scan showed 17 x 21 hemorrhagic infarcts posterior right basal ganglia and progression of microvascular disease. His his systolic blood pressure 323/55 on arrival. Follow cranial CT scan 08/04/2013 with stable hypertensive hemorrhage that's stable and mild vasogenic edema. Patient enrolled in ATACH-II study for BP control. Neurology recommends repeat CCT in 2-3 weeks to decide on antiplatelet therapy  No c/os Using AFO with PT, Orthotist felt blue rocker bottom without knee bracing, discussed bracing with PT today Review of Systems - Negative except Left side weak  Objective: Vital Signs: Blood pressure 124/68, pulse 78, temperature 97.7 F (36.5 C), temperature source Oral, resp. rate 17, height 5\' 3"  (1.6 m), weight 64.6 kg (142 lb 6.7 oz), SpO2 95.00%. No results found. No results found for this or any previous visit (from the past 72 hour(s)).    Vitals reviewed.  Constitutional: He is oriented to person, place, and time. No distress  HEENT: oral mucosa moist. Missing a few teeth  Eyes:  Pupils reactive to light  Neck: Normal range of motion. Neck supple. No thyromegaly present.  Cardiovascular:  Rate controlled, irregular rhythm, small sem  Respiratory: Effort normal and breath sounds normal. No respiratory distress. No wheezes or rales  GI: Soft. Bowel sounds are normal. He exhibits no distension.  Neurological: He is alert and oriented to person, place, and time.  Flat affect. Follows commands. Speech is mildly dysarthric but intelligible. Improving oromotor control. . LUE is grossly 4/5 moderate ataxia. LLE is better at 3+/5 HF, KE and 4- ADF/APF. improved LT LUE > LLE, proprioception intact LLE. Limited insight and awareness.  Skin: Skin is warm and dry.     Assessment/Plan: 1. Functional deficits secondary to Right basal ganglia hemmorrhagic infarct which require 3+ hours per day of interdisciplinary therapy in a comprehensive inpatient rehab setting. Physiatrist is providing close team supervision and 24 hour management of active medical problems listed below. Physiatrist and rehab team continue to assess barriers to discharge/monitor patient progress toward functional and medical goals.  Needs additional Family training FIM: FIM - Bathing Bathing Steps Patient Completed: Left Arm;Chest;Right upper leg;Left upper leg;Abdomen;Front perineal area;Right lower leg (including foot);Left lower leg (including foot);Right Arm;Buttocks Bathing: 5: Supervision: Safety issues/verbal cues  FIM - Upper Body Dressing/Undressing Upper body dressing/undressing steps patient completed: Thread/unthread left sleeve of front closure shirt/dress;Thread/unthread right sleeve of front closure shirt/dress;Thread/unthread right sleeve of pullover shirt/dresss;Thread/unthread left sleeve of pullover shirt/dress;Put head through opening of pull over shirt/dress;Pull shirt over trunk;Pull shirt around back of front closure shirt/dress;Button/unbutton shirt Upper body dressing/undressing: 5: Supervision: Safety issues/verbal cues FIM - Lower Body Dressing/Undressing Lower body dressing/undressing steps patient completed: Thread/unthread right underwear leg;Thread/unthread left underwear leg;Thread/unthread right pants leg;Thread/unthread left pants leg;Don/Doff right shoe;Pull underwear up/down;Pull pants up/down;Don/Doff right sock;Don/Doff left sock;Don/Doff left shoe (elastic shoelaces) Lower body dressing/undressing: 5: Supervision: Safety issues/verbal cues  FIM - Toileting Toileting steps completed by patient: Adjust clothing prior to toileting;Performs perineal hygiene;Adjust clothing after toileting Toileting Assistive Devices: Grab bar or rail for  support Toileting: 4: Steadying assist  FIM - Radio producer Devices: Grab bars Toilet Transfers: 4-To toilet/BSC: Min A (steadying Pt. > 75%);4-From toilet/BSC: Min A (steadying Pt. > 75%)  FIM - Bed/Chair Transfer Bed/Chair Transfer Assistive Devices: Arm rests;Bed rails Bed/Chair Transfer: 4: Chair or W/C > Bed: Min A (steadying Pt. >  75%);4: Bed > Chair or W/C: Min A (steadying Pt. > 75%);5: Sit > Supine: Supervision (verbal cues/safety issues);5: Supine > Sit: Supervision (verbal cues/safety issues)  FIM - Locomotion: Wheelchair Distance: 100 Locomotion: Wheelchair: 2: Travels 50 - 149 ft with supervision, cueing or coaxing FIM - Locomotion: Ambulation Locomotion: Ambulation Assistive Devices: Walker - Rolling;Orthosis (hand orthosis; AFO) Ambulation/Gait Assistance: 3: Mod assist Locomotion: Ambulation: 1: Travels less than 50 ft with moderate assistance (Pt: 50 - 74%)  Comprehension Comprehension Mode: Auditory Comprehension: 6-Follows complex conversation/direction: With extra time/assistive device  Expression Expression Mode: Verbal Expression: 5-Expresses complex 90% of the time/cues < 10% of the time  Social Interaction Social Interaction: 6-Interacts appropriately with others with medication or extra time (anti-anxiety, antidepressant).  Problem Solving Problem Solving: 5-Solves complex 90% of the time/cues < 10% of the time  Memory Memory: 5-Recognizes or recalls 90% of the time/requires cueing < 10% of the time   Medical Problem List and Plan:  Hemorrhagic right basal ganglia infarcts, left sensory ataxia improving 1. DVT Prophylaxis/Anticoagulation: Pharmaceutical: Lovenox  2. Pain Management: N/A  3. Mood: No signs of distress noted. LCSW to follow for evaluation.  4. Neuropsych: This patient is capable of making decisions on his own behalf.  5. HTN: Monitor with qid checks. Continue HCTZ, Avapro and Norvasc. Mild hypo K,  supplement 6. BPH: continue proscar.   LOS (Days) 17 A FACE TO FACE EVALUATION WAS PERFORMED  KIRSTEINS,ANDREW E 08/24/2013, 8:02 AM

## 2013-08-24 NOTE — Progress Notes (Signed)
Physical Therapy Session Note  Patient Details  Name: Jack [name] MRN: [id number] Date of Birth: [birth year]  Today's Date: 08/24/2013 Time 8:03-9:05 and 11:00-11:45  Time calc 29min and 41min  Short Term Goals: Week 3:  PT Short Term Goal 1 (Week 3): Pt will perform basic transfers with supervision 3/5 trials PT Short Term Goal 2 (Week 3): Pt will ambulate 100' with Min A and LRAD PT Short Term Goal 3 (Week 3): Pt will ascend/descend 6 step swith Min A PT Short Term Goal 4 (Week 3): Pt will demonstrate dynamic standing balance with Min A during functional task PT Short Term Goal 5 (Week 3): Pt will propell WC in community setting x150' with S  Skilled Therapeutic Interventions/Progress Updates:  1:2 Tx focused on NMR via forced use, verbal and tacitle cues for R UE and LE motor control and propioception as well as transfer training and gait with RW and orthosis.  Supine>sit with S, dynamic sitting balance with min cues for UE management. Dressing and toileting with min A.  Transfer bed>WC<>toilet with Min A and improved safety since last visit. Pt more consciencous of controlling descent   WC propulsion x150' with cues needed for LLE management and safety.  Standing on airx x24min with LUE task (moving checkers around board) for increased LE propioceptive input. Min A needed and cues for L weight shifting and ext.  Gait training x150' with RW and hand orthosis with mod A for RW management and periodic balance, but pt needing no manual facilitation of LLE today! Pt responding well to mod cues for decreased L step and getting "tall" on L prior to R step.  Noted increased localized sensation today!  Performed graded muscle activation task in sitting with wife giving cues for control during hip flexion and LAQ on L.   2:2 Tx focused on gait without device for increased training, NMR for L stance components, and stairs with bil rails.  Began with RW, then transitioned to L HHA with therapist R  arm around trunk; Mod A for support due to L lean during L stance. Pt needing up to Max A during turns and in busy situations. Also performed gait x60' with mirror for feedback with R HHA, which reduced L lean as much. Pt given mod verbal cues for step length as well as tucking hips in L stance and upright posture. Pt needing verbal cues for foot placement during turns and transitions.   Performed sustained L weight shifts in stance with mirror for visual feedback to attempt coordinating simultaneous L hip and knee ext with upright trunk, which is tough for pt, but improved with practice. 10 L leans with 5 sec holds. Also perform R step taps to 2" step for increased L hip control in stance, up to max A needed, but improved with practice.   Stairs with bil rails and mod A for steadying and cues for safety.  Pt returned to room with wife present.       Therapy Documentation Precautions:  Precautions Precautions: Fall Precaution Comments: Severely impaired LUE/LLE sensation Restrictions Weight Bearing Restrictions: No General:   Vital Signs: Therapy Vitals Temp: 97.7 F (36.5 C) Temp src: Oral Pulse Rate: 78 Resp: 17 BP: 124/68 mmHg Patient Position, if appropriate: Lying Oxygen Therapy SpO2: 95 % O2 Device: None (Room air) Pain: none   Locomotion : Wheelchair Mobility Distance: 150   See FIM for current functional status  Therapy/Group: Individual Therapy  Kennieth Rad, PT, DPT   08/24/2013,  8:55 AM

## 2013-08-24 NOTE — Progress Notes (Signed)
Occupational Therapy Weekly Progress Note  Patient Details  Name: Jack [name] MRN: [id number] Date of Birth: [birth year]  Today's Date: 08/24/2013 Time: 7416-3845 and 3646-8032 Time Calculation (min): 55 min and 27 min  Patient has met 4 of 4 short term goals.  Pt making steady progress towards goals.  Min/steady assist in standing and with squat pivot transfers.  Pt with increased functional use of LUE during self-care tasks.  Family education has begun with wife in preparation for d/c home next week.  Patient continues to demonstrate the following deficits: Lt side sensory loss and ataxia, decreased midline awareness, LUE inattention, decreased LLE control in standing and therefore will continue to benefit from skilled OT intervention to enhance overall performance with BADL and Reduce care partner burden.  Patient progressing toward long term goals..  Continue plan of care.  OT Short Term Goals Week 2:  OT Short Term Goal 1 (Week 2): Pt will be able to stand with min A while adjusting clothing over his hips.  OT Short Term Goal 1 - Progress (Week 2): Met OT Short Term Goal 2 (Week 2): Pt will be able to fully pull his underwear and pants over hips. OT Short Term Goal 2 - Progress (Week 2): Met OT Short Term Goal 3 (Week 2): Pt will demonstrate improved L hand control by using it to pull on his socks with mod A. OT Short Term Goal 3 - Progress (Week 2): Met OT Short Term Goal 4 (Week 2): Pt will be able to recall the steps of his safe squat pivot transfer with min cues. OT Short Term Goal 4 - Progress (Week 2): Met Week 3:  OT Short Term Goal 1 (Week 3): STG = LTGs due to remaining LOS  Skilled Therapeutic Interventions/Progress Updates:    1) Pt seen for ADL retraining with focus on transfers, sit <> stand, and standing balance during self-care tasks of bathing, dressing, and grooming.  Completed stand pivot w/c > shower chair with min/steady assist with use of grab bars.  Engaged in  bathing in sitting with pt demonstrating use of LUE approx 40%, requiring verbal cues to continue to attempt to engage LUE in functional tasks.  Min/steady assist in standing when using Lt hand to wash buttocks and when pulling pants over hips.  Engaged in grooming task in standing at sink with use of mirror to improve midline orientation in standing and functional use of LUE with combing hair.  Pt with multiple drops of comb throughout grooming task.  Discussed bathroom setup with wife, who reports has a friend with a shower chair that they may be able to borrow.  Encouraged her to find out the specifics of it to see if it will fit in their shower.  2) Engaged in [place] re-ed in sitting and standing with focus on functional use of LUE.  Completed clothespin activity with LUE with focus on midline orientation in standing and motor coordination with LUE.  Pt required mod verbal and tactile cues for WB through LLE in standing, use of mirror to provide visual feedback on standing balance and midline orientation.  Pt dropped 4 of 20 clothespins, due to decreased coordination and sustained grasp.  Therapy Documentation Precautions:  Precautions Precautions: Fall Precaution Comments: Severely impaired LUE/LLE sensation Restrictions Weight Bearing Restrictions: No Pain: Pain Assessment Pain Assessment: 0-10 Pain Score: 0-No pain Patients Stated Pain Goal: 3 Multiple Pain Sites: No  See FIM for current functional status  Therapy/Group: Individual Therapy  Daylen Hack,  [facility] 08/24/2013, 11:44 AM

## 2013-08-25 ENCOUNTER — Inpatient Hospital Stay (HOSPITAL_COMMUNITY): Payer: Medicare Other

## 2013-08-25 NOTE — Progress Notes (Signed)
Subjective/Complaints: 80 y.o. left-handed male with history of atrial fibrillation, hypertension, PFO, CVA 2009 not on anticoagulations and very little residual weakness. Admitted 08/02/2013 with left-sided weakness. Cranial CT scan showed 17 x 21 hemorrhagic infarcts posterior right basal ganglia and progression of microvascular disease. His his systolic blood pressure 016/01 on arrival. Follow cranial CT scan 08/04/2013 with stable hypertensive hemorrhage that's stable and mild vasogenic edema. Patient enrolled in ATACH-II study for BP control. Neurology recommends repeat CCT in 2-3 weeks to decide on antiplatelet therapy   Using AFO with PT,new orthosis comfortable and effective Review of Systems - Negative except Left side weak  Objective: Vital Signs: Blood pressure 146/88, pulse 76, temperature 97.6 F (36.4 C), temperature source Oral, resp. rate 19, height 5\' 3"  (1.6 m), weight 64.6 kg (142 lb 6.7 oz), SpO2 95.00%. No results found. No results found for this or any previous visit (from the past 72 hour(s)).    Vitals reviewed.  Constitutional: He is oriented to person, place, and time. No distress   Cardiovascular:  Rate controlled, irregular rhythm, small sem  Respiratory: Effort normal and breath sounds normal. No respiratory distress. No wheezes or rales  GI: Soft. Bowel sounds are normal. He exhibits no distension.  Neurological: He is alert and oriented to person, place, and time.  Flat affect. Follows commands. Speech is mildly dysarthric but intelligible. Improving oromotor control. . LUE is grossly 4/5 moderate ataxia. LLE is better at 3+/5 HF, KE and 4- ADF/APF. improved LT LUE > LLE, proprioception intact LLE. Limited insight and awareness.  Skin: Skin is warm and dry.    Assessment/Plan: 1. Functional deficits secondary to Right basal ganglia hemmorrhagic infarct which require 3+ hours per day of interdisciplinary therapy in a comprehensive inpatient rehab  setting. Physiatrist is providing close team supervision and 24 hour management of active medical problems listed below. Physiatrist and rehab team continue to assess barriers to discharge/monitor patient progress toward functional and medical goals.  Needs additional Family training FIM: FIM - Bathing Bathing Steps Patient Completed: Chest;Right Arm;Left Arm;Abdomen;Front perineal area;Buttocks;Right upper leg;Left upper leg;Right lower leg (including foot);Left lower leg (including foot) Bathing: 4: Steadying assist  FIM - Upper Body Dressing/Undressing Upper body dressing/undressing steps patient completed: Thread/unthread left sleeve of front closure shirt/dress;Thread/unthread right sleeve of front closure shirt/dress;Thread/unthread right sleeve of pullover shirt/dresss;Thread/unthread left sleeve of pullover shirt/dress;Put head through opening of pull over shirt/dress;Pull shirt over trunk;Pull shirt around back of front closure shirt/dress;Button/unbutton shirt Upper body dressing/undressing: 5: Set-up assist to: Obtain clothing/put away FIM - Lower Body Dressing/Undressing Lower body dressing/undressing steps patient completed: Thread/unthread right underwear leg;Thread/unthread left underwear leg;Thread/unthread right pants leg;Thread/unthread left pants leg;Don/Doff right shoe;Pull underwear up/down;Pull pants up/down;Don/Doff right sock;Don/Doff left sock Lower body dressing/undressing: 4: Min-Patient completed 75 plus % of tasks  FIM - Toileting Toileting steps completed by patient: Adjust clothing prior to toileting;Performs perineal hygiene;Adjust clothing after toileting Toileting Assistive Devices: Grab bar or rail for support Toileting: 4: Steadying assist  FIM - Radio producer Devices: Grab bars Toilet Transfers: 4-To toilet/BSC: Min A (steadying Pt. > 75%);4-From toilet/BSC: Min A (steadying Pt. > 75%)  FIM - Ship broker Devices: Orthosis;Arm rests Bed/Chair Transfer: 3: Bed > Chair or W/C: Mod A (lift or lower assist);4: Chair or W/C > Bed: Min A (steadying Pt. > 75%)  FIM - Locomotion: Wheelchair Distance: 150 Locomotion: Wheelchair: 1: Total Assistance/staff pushes wheelchair (Pt<25%) FIM - Locomotion: Ambulation Locomotion: Ambulation Assistive Devices: Other (comment);Orthosis (  HHA) Ambulation/Gait Assistance: 2: Max assist Locomotion: Ambulation: 2: Travels 150 ft or more with maximal assistance (Pt: 25 - 49%)  Comprehension Comprehension Mode: Auditory Comprehension: 6-Follows complex conversation/direction: With extra time/assistive device  Expression Expression Mode: Verbal Expression: 5-Expresses basic 90% of the time/requires cueing < 10% of the time.  Social Interaction Social Interaction: 6-Interacts appropriately with others with medication or extra time (anti-anxiety, antidepressant).  Problem Solving Problem Solving: 5-Solves complex 90% of the time/cues < 10% of the time  Memory Memory: 5-Recognizes or recalls 90% of the time/requires cueing < 10% of the time   Medical Problem List and Plan:  Hemorrhagic right basal ganglia infarcts, left sensory ataxia improving 1. DVT Prophylaxis/Anticoagulation: Pharmaceutical: Lovenox  2. Pain Management: N/A  3. Mood: No signs of distress noted. LCSW to follow for evaluation.  4. Neuropsych: This patient is capable of making decisions on his own behalf.  5. HTN: Monitor with qid checks. Continue HCTZ, Avapro and Norvasc. Mild hypo K, supplement 6. BPH: continue proscar.   LOS (Days) 18 A FACE TO FACE EVALUATION WAS PERFORMED  KIRSTEINS,ANDREW E 08/25/2013, 9:38 AM

## 2013-08-25 NOTE — Progress Notes (Addendum)
Physical Therapy Note  Patient Details  Name: Jack [name] MRN: [id number] Date of Birth: [birth year] Today's Date: 08/25/2013 [id number]; 45 min  individual therapy No pain reported  neuromuscular re-education via demo, VCs for L and RLE alternating isolated movements in sitting, R step/taps for L stance stability in standing, gait with and without AD  Wife performed w/c>< bed squat pivot transfers with min assist> min guard assist.  She remembered all steps except placement of L foot prior to transfer.  Gait without AD x 30' with max assist, therapist standing in front of pt; x 76' with weighted grocery cart, min assist, working on wt shifting, L foot step length, L stance stability, upright trunk and forward gaze.  Up/down (3) 5" high steps R or L rail, min assist. Pt has 2 flights of 6 steps at home; one set with R rail ascending, one set with L rail ascending.  He benefited from this sequence:  Rail on right- ascend facing forwards, with helper hold his L hand; descend backwards with helper holding his L hand. Rail on left- ascend and descend turned sideways, both hands on rail.  Daevon Holdren 08/25/2013, 7:52 AM

## 2013-08-26 ENCOUNTER — Inpatient Hospital Stay (HOSPITAL_COMMUNITY): Payer: Medicare Other | Admitting: *Deleted

## 2013-08-26 NOTE — Progress Notes (Signed)
Subjective/Complaints: 80 y.o. left-handed male with history of atrial fibrillation, hypertension, PFO, CVA 2009 not on anticoagulations and very little residual weakness. Admitted 08/02/2013 with left-sided weakness. Cranial CT scan showed 17 x 21 hemorrhagic infarcts posterior right basal ganglia and progression of microvascular disease. His his systolic blood pressure 240/97 on arrival. Follow cranial CT scan 08/04/2013 with stable hypertensive hemorrhage that's stable and mild vasogenic edema. Patient enrolled in ATACH-II study for BP control. Neurology recommends repeat CCT in 2-3 weeks to decide on antiplatelet therapy   Discussed d/c on 2/19 with pt and wife , they are excited but a little apprehensive, have family to assist Review of Systems - Negative except Left side weak  Objective: Vital Signs: Blood pressure 145/75, pulse 79, temperature 97.9 F (36.6 C), temperature source Oral, resp. rate 19, height 5\' 3"  (1.6 m), weight 64.6 kg (142 lb 6.7 oz), SpO2 95.00%. No results found. No results found for this or any previous visit (from the past 72 hour(s)).    Vitals reviewed.  Constitutional: He is oriented to person, place, and time. No distress   Cardiovascular:  Rate controlled, irregular rhythm, small sem  Respiratory: Effort normal and breath sounds normal. No respiratory distress. No wheezes or rales  GI: Soft. Bowel sounds are normal. He exhibits no distension.  Neurological: He is alert and oriented to person, place, and time.  Flat affect. Follows commands. Speech is mildly dysarthric but intelligible. Improving oromotor control. . LUE is grossly 4/5 moderate ataxia. LLE is better at 3+/5 HF, KE and 4- ADF/APF. improved LT LUE > LLE, proprioception intact LLE. Limited insight and awareness.  Skin: Skin is warm and dry.    Assessment/Plan: 1. Functional deficits secondary to Right basal ganglia hemmorrhagic infarct which require 3+ hours per day of interdisciplinary  therapy in a comprehensive inpatient rehab setting. Physiatrist is providing close team supervision and 24 hour management of active medical problems listed below. Physiatrist and rehab team continue to assess barriers to discharge/monitor patient progress toward functional and medical goals.   FIM: FIM - Bathing Bathing Steps Patient Completed: Chest;Right Arm;Left Arm;Abdomen;Front perineal area;Buttocks;Right upper leg;Left upper leg;Right lower leg (including foot);Left lower leg (including foot) Bathing: 4: Steadying assist  FIM - Upper Body Dressing/Undressing Upper body dressing/undressing steps patient completed: Thread/unthread left sleeve of front closure shirt/dress;Thread/unthread right sleeve of front closure shirt/dress;Thread/unthread right sleeve of pullover shirt/dresss;Thread/unthread left sleeve of pullover shirt/dress;Put head through opening of pull over shirt/dress;Pull shirt over trunk;Pull shirt around back of front closure shirt/dress;Button/unbutton shirt Upper body dressing/undressing: 5: Set-up assist to: Obtain clothing/put away FIM - Lower Body Dressing/Undressing Lower body dressing/undressing steps patient completed: Thread/unthread right underwear leg;Thread/unthread left underwear leg;Thread/unthread right pants leg;Thread/unthread left pants leg;Don/Doff right shoe;Pull underwear up/down;Pull pants up/down;Don/Doff right sock;Don/Doff left sock Lower body dressing/undressing: 4: Min-Patient completed 75 plus % of tasks  FIM - Toileting Toileting steps completed by patient: Adjust clothing prior to toileting;Performs perineal hygiene;Adjust clothing after toileting Toileting Assistive Devices: Grab bar or rail for support Toileting: 4: Steadying assist  FIM - Radio producer Devices: Grab bars Toilet Transfers: 4-To toilet/BSC: Min A (steadying Pt. > 75%);4-From toilet/BSC: Min A (steadying Pt. > 75%)  FIM - Financial trader Devices: Orthosis;Arm rests Bed/Chair Transfer: 4: Bed > Chair or W/C: Min A (steadying Pt. > 75%);4: Chair or W/C > Bed: Min A (steadying Pt. > 75%)  FIM - Locomotion: Wheelchair Distance: 150 Locomotion: Wheelchair: 1: Total Assistance/staff pushes wheelchair (Pt<25%) FIM -  Locomotion: Ambulation Locomotion: Ambulation Assistive Devices: Other (comment);Orthosis (weighted grocery cart) Ambulation/Gait Assistance: 4: Min assist Locomotion: Ambulation: 2: Travels 50 - 149 ft with minimal assistance (Pt.>75%)  Comprehension Comprehension Mode: Auditory Comprehension: 6-Follows complex conversation/direction: With extra time/assistive device  Expression Expression Mode: Verbal Expression: 5-Expresses basic needs/ideas: With no assist  Social Interaction Social Interaction: 6-Interacts appropriately with others with medication or extra time (anti-anxiety, antidepressant).  Problem Solving Problem Solving: 5-Solves basic 90% of the time/requires cueing < 10% of the time  Memory Memory: 5-Recognizes or recalls 90% of the time/requires cueing < 10% of the time   Medical Problem List and Plan:  Hemorrhagic right basal ganglia infarcts, left sensory ataxia improving 1. DVT Prophylaxis/Anticoagulation: Pharmaceutical: Lovenox  2. Pain Management: N/A  3. Mood: No signs of distress noted. LCSW to follow for evaluation.  4. Neuropsych: This patient is capable of making decisions on his own behalf.  5. HTN: Monitor with qid checks. Continue HCTZ, Avapro and Norvasc. Mild hypo K, supplement 6. BPH: continue proscar.   LOS (Days) [place] EVALUATION WAS PERFORMED  Coby Shrewsberry E 08/26/2013, 8:19 AM

## 2013-08-26 NOTE — Progress Notes (Signed)
Occupational Therapy Note  Patient Details  Name: KONA LOVER MRN: [id number] Date of Birth: 1934-05-10 Today's Date: 08/26/2013  Time:  1130-1200   30 min) Pain:  none Individual session  Pt. Sitting in wc upon OT arrival.  Addressed NMRE of LUE with AAROM, AROM, and demontration of exercises to do when in free time.  Pt. Performed LUE movements with minimal facilitation at elbow and wrist and cues for increased ROM.  Performed Finger to nose on left 6x/20 sec; Right= 15x/20 sec.  Did didokinesis with left hand x15  times with decreased accuracy but able to touch all fingers.  Asked pt to practice exercises for LUE in his down time.    Left pt in wc with lunch set up.   Lisa Roca 08/26/2013, 11:38 AM

## 2013-08-27 ENCOUNTER — Inpatient Hospital Stay (HOSPITAL_COMMUNITY): Payer: Medicare Other

## 2013-08-27 ENCOUNTER — Inpatient Hospital Stay (HOSPITAL_COMMUNITY): Payer: Medicare Other | Admitting: Occupational Therapy

## 2013-08-27 MED ORDER — TAMSULOSIN HCL 0.4 MG PO CAPS
0.4000 mg | ORAL_CAPSULE | Freq: Every day | ORAL | Status: DC
  Administered 2013-08-27 – 2013-08-29 (×3): 0.4 mg via ORAL
  Filled 2013-08-27 (×4): qty 1

## 2013-08-27 NOTE — Progress Notes (Signed)
Subjective/Complaints: 80 y.o. left-handed male with history of atrial fibrillation, hypertension, PFO, CVA 2009 not on anticoagulations and very little residual weakness. Admitted 08/02/2013 with left-sided weakness. Cranial CT scan showed 17 x 21 hemorrhagic infarcts posterior right basal ganglia and progression of microvascular disease. His his systolic blood pressure 440/34 on arrival. Follow cranial CT scan 08/04/2013 with stable hypertensive hemorrhage that's stable and mild vasogenic edema. Patient enrolled in ATACH-II study for BP control. Neurology recommends repeat CCT in 2-3 weeks to decide on antiplatelet therapy Pt without new issues, Review of Systems - Negative except Left side weak  Objective: Vital Signs: Blood pressure 119/69, pulse 100, temperature 98.3 F (36.8 C), temperature source Oral, resp. rate 19, height 5\' 3"  (1.6 m), weight 64.6 kg (142 lb 6.7 oz), SpO2 96.00%. No results found. No results found for this or any previous visit (from the past 72 hour(s)).    Vitals reviewed.  Constitutional: He is oriented to person, place, and time. No distress   Cardiovascular:  Rate controlled, irregular rhythm, small sem  Respiratory: Effort normal and breath sounds normal. No respiratory distress. No wheezes or rales  GI: Soft. Bowel sounds are normal. He exhibits no distension.  Neurological: He is alert and oriented to person, place, and time.  Flat affect. Follows commands. Speech is mildly dysarthric but intelligible. Improving oromotor control. . LUE is grossly 4/5 moderate ataxia. LLE is better at 3+/5 HF, KE and 4- ADF/APF. improved LT LUE > LLE, proprioception intact LLE. Limited insight and awareness.  Skin: Skin is warm and dry.    Assessment/Plan: 1. Functional deficits secondary to Right basal ganglia hemmorrhagic infarct which require 3+ hours per day of interdisciplinary therapy in a comprehensive inpatient rehab setting. Physiatrist is providing close team  supervision and 24 hour management of active medical problems listed below. Physiatrist and rehab team continue to assess barriers to discharge/monitor patient progress toward functional and medical goals.   FIM: FIM - Bathing Bathing Steps Patient Completed: Chest;Right Arm;Left Arm;Abdomen;Front perineal area;Buttocks;Right upper leg;Left upper leg;Right lower leg (including foot);Left lower leg (including foot) Bathing: 4: Steadying assist  FIM - Upper Body Dressing/Undressing Upper body dressing/undressing steps patient completed: Thread/unthread left sleeve of front closure shirt/dress;Thread/unthread right sleeve of front closure shirt/dress;Thread/unthread right sleeve of pullover shirt/dresss;Thread/unthread left sleeve of pullover shirt/dress;Put head through opening of pull over shirt/dress;Pull shirt over trunk;Pull shirt around back of front closure shirt/dress;Button/unbutton shirt Upper body dressing/undressing: 5: Set-up assist to: Obtain clothing/put away FIM - Lower Body Dressing/Undressing Lower body dressing/undressing steps patient completed: Thread/unthread right underwear leg;Thread/unthread left underwear leg;Thread/unthread right pants leg;Thread/unthread left pants leg;Don/Doff right shoe;Pull underwear up/down;Pull pants up/down;Don/Doff right sock;Don/Doff left sock Lower body dressing/undressing: 4: Steadying Assist  FIM - Toileting Toileting steps completed by patient: Adjust clothing prior to toileting;Performs perineal hygiene;Adjust clothing after toileting Toileting Assistive Devices: Grab bar or rail for support Toileting: 4: Steadying assist  FIM - Radio producer Devices: Grab bars Toilet Transfers: 4-To toilet/BSC: Min A (steadying Pt. > 75%);4-From toilet/BSC: Min A (steadying Pt. > 75%)  FIM - Control and instrumentation engineer Devices: Orthosis;Arm rests Bed/Chair Transfer: 4: Bed > Chair or W/C: Min A  (steadying Pt. > 75%);4: Chair or W/C > Bed: Min A (steadying Pt. > 75%)  FIM - Locomotion: Wheelchair Distance: 150 Locomotion: Wheelchair: 1: Total Assistance/staff pushes wheelchair (Pt<25%) FIM - Locomotion: Ambulation Locomotion: Ambulation Assistive Devices: Other (comment);Orthosis (weighted grocery cart) Ambulation/Gait Assistance: 4: Min assist Locomotion: Ambulation: 2: Travels 50 -  149 ft with minimal assistance (Pt.>75%)  Comprehension Comprehension Mode: Auditory Comprehension: 6-Follows complex conversation/direction: With extra time/assistive device  Expression Expression Mode: Verbal Expression: 5-Expresses complex 90% of the time/cues < 10% of the time  Social Interaction Social Interaction: 6-Interacts appropriately with others with medication or extra time (anti-anxiety, antidepressant).  Problem Solving Problem Solving: 5-Solves basic problems: With no assist  Memory Memory: 5-Recognizes or recalls 90% of the time/requires cueing < 10% of the time   Medical Problem List and Plan:  Hemorrhagic right basal ganglia infarcts, left sensory ataxia improving 1. DVT Prophylaxis/Anticoagulation: Pharmaceutical: Lovenox  2. Pain Management: N/A  3. Mood: No signs of distress noted. LCSW to follow for evaluation.  4. Neuropsych: This patient is capable of making decisions on his own behalf.  5. HTN: Monitor with qid checks. Continue HCTZ, Avapro and Norvasc. Mild hypo K, supplement 6. BPH: continue proscar. Trial flomax at noc  LOS (Days) 20 A FACE TO FACE EVALUATION WAS PERFORMED  Jack [name] E 08/27/2013, 7:33 AM

## 2013-08-27 NOTE — Progress Notes (Addendum)
Physical Therapy Note  Patient Details  Name: Jack [name] MRN: [id number] Date of Birth: 1934-06-23 Today's Date: 08/27/2013 0903-1000, 57 min individual; 1500-1545, 45 min individual No pain reported Wearing personal R AFO throughout  Gait LTG downgraded to 34' as pt fatigues and begins to forget sequencing at around 40-50'.  Tx 1:  neuromuscular re-education via forced use, demo, visual feedback with mirror, manual cues for L stance stability, wt shifting, L hip extension in standing ; gait; L hand grasp task in sitting  Gait with RW, R hand splint x 50' with min assist except for 2 instances requiring min/mod assist due to incorrect sequence, stepping with R foot after moving RW forward.    Gait up/down 5 steps, using 1 rail at a time; turned sideways when rail is on L ascending (as in pt's flight from main  to BR level); forward when rail is on R ascending and backwards when descending (den to main level).  Family ed with wife on stairs, safely with min assist and min cues. Further ed for basic transfers, wife performing x 2; pt and wife both forgot to check placement of L foot before pushing up from surface.  Tx 2:  Continuing family ed with wife for w/c>< hospital bed in room,; S for bed mobility, S/min guard assist for squat pivot transfer; also gait with wife with RW, L hand splint, L AFO, x 30' including turn to R with min assist. Pt mistepped and did not progress L LE after RW; wife did not correct pt.  neuromuscular re-education as above for trunk activation for pelvic dissociation, L pelvic protraction in sitting,  L hip extension in standing, gait  Gait as above, with min assist, x 50' including turn to L, without LOB or problems with sequencing.  Rahim Astorga 08/27/2013, 8:01 AM

## 2013-08-27 NOTE — Progress Notes (Signed)
Occupational Therapy Session Note  Patient Details  Name: Jack [name] MRN: [id number] Date of Birth: 12/21/1933  Today's Date: 08/27/2013 Time: 0732-0830 and 1130-1200 Time Calculation (min): 58 min and 30 min  Short Term Goals: Week 3:  OT Short Term Goal 1 (Week 3): STG = LTGs due to remaining LOS  Skilled Therapeutic Interventions/Progress Updates:    1) Pt seen for ADL retraining with focus on transfers, sit <> stand, and standing balance during self-care tasks of bathing, dressing, and grooming. Completed stand pivot w/c > shower chair with min/steady assist with use of grab bars, pt with quick movement into shower when pivoting requiring min assist to control movement with descent to chair. Engaged in bathing in sitting with pt demonstrating use of LUE approx 40%, requiring verbal cues to continue to attempt to engage LUE in functional tasks. Min/steady assist in standing when using Lt hand to wash buttocks and when pulling pants over hips. Engaged in grooming task in standing at sink with use of mirror to improve midline orientation in standing and functional use of LUE with combing hair and brushing teeth with Rt hand. Stand pivot transfer to toilet from w/c with min assist with cues for LLE placement prior to standing and with transfer back to w/c.  Wife observing toilet transfer.  2) Educated pt's wife on toilet transfers <> w/c and checked her off to provide toilet transfer at this level, not yet ready to complete ambulating with RW with wife.  Pt completed hand hygiene in standing with 2 tactile cues to extend Lt knee to promote equal WB in standing.  Engaged in [place] re-ed in standing with focus on functional use of LUE.  Completed 3D pipe tree with LUE with focus on midline orientation in standing and motor coordination with LUE.  Pt required mod verbal and tactile cues for WB through LLE in standing.  Therapy Documentation Precautions:  Precautions Precautions: Fall Precaution  Comments: Severely impaired LUE/LLE sensation Restrictions Weight Bearing Restrictions: No General:   Vital Signs: Therapy Vitals Temp: 98.3 F (36.8 C) Temp src: Oral Pulse Rate: 100 Resp: 19 BP: 119/69 mmHg Patient Position, if appropriate: Lying Oxygen Therapy SpO2: 96 % O2 Device: None (Room air) Pain:  Pt with no c/o pain  See FIM for current functional status  Therapy/Group: Individual Therapy  Simonne Come 08/27/2013, 8:32 AM

## 2013-08-28 ENCOUNTER — Inpatient Hospital Stay (HOSPITAL_COMMUNITY): Payer: Medicare Other | Admitting: *Deleted

## 2013-08-28 ENCOUNTER — Inpatient Hospital Stay (HOSPITAL_COMMUNITY): Payer: Medicare Other | Admitting: Occupational Therapy

## 2013-08-28 NOTE — Progress Notes (Signed)
Physical Therapy Session Note  Patient Details  Name: Jack [name] MRN: [id number] Date of Birth: 1933/08/28  Today's Date: 08/28/2013 Time: 10:05-11:00 (60min) and 13:05-13:50 (15min)   Short Term Goals:  Week 3:  PT Short Term Goal 1 (Week 3): Pt will perform basic transfers with supervision 3/5 trials PT Short Term Goal 2 (Week 3): Pt will ambulate 100' with Min A and LRAD PT Short Term Goal 3 (Week 3): Pt will ascend/descend 6 step swith Min A PT Short Term Goal 4 (Week 3): Pt will demonstrate dynamic standing balance with Min A during functional task PT Short Term Goal 5 (Week 3): Pt will propell WC in community setting x150' with S  Skilled Therapeutic Interventions/Progress Updates:  1:2 Wife unable to attend therapy today due to weather. Tx focused on transfer training including furniture and car; gait training with RW; stairs, and NMR throughout for forced use of LLE and LUE, verbal and tactile cues for placement and motor control.  Gait in room with RW <>bathroom and standing at sink for washing with Min A overall and good self-corrected mobility errors.    Gait in controlled setting x150' with Min A verbal cues for sequence and L step length. Pt able to verbalize proper foot placement, tacitle cues needed as pt fatigued with decreased L hip and knee ext. Pt had several instances of LOB and/or L step not advancing, but he was able to correct with Min A.   Performed serial squat-pivot transfers x12  from various furniture items and surfaces. Pt needing cues for keeping trunk forward and hips elevated. Min A 75% and close S 25% of the time.  Performed car transfers WC<>car x4 with min A and cues for safe hand placement and sequence.   Ascended/descended 12 stairs with Min A with 1 rail, same technique as yesterday: HHA with R rail and bil UE on rail with L rail, descending backwards.   Pt left up in [employer] [facility] [employer] [facility] in room with all needs.   2:2 Wife present for family training, which  consisted of gait training, stairs, car and basic transfers.  Wife states WC that will be borrowed is 16" height, 18"x16" which may work, but she is unlikely able to bring it in before DC.  Pt began standing but with increased forward translation of knees over feet without enough lift. Provided safety cues to avoid this and demo.  Sit<>stand transfers with Min A, gait 1x100' with RW and min A of wife, who provided good cues for pt's step length. Therapist needed to provide sequence reminders. Pt had several instances of L scissoring, but they were able to correct with Min A. Pt needed max cues for slow movements during transitions throughout tx due to scissoring and unsafe sequencing.   Demonstrated stair technique, performed x4 with pt and wife performed x4 with pt with same sequence as above. Wife able to provided needed cues, but she needed cues for optimal positioning. Pt took one incorrect step with significant LOB, but they were able to recover with Min A. Discussed options for RW management carrying before/after pt goes up/down  Performed car transfer x3 with Min A and cues for options.   Pt left up in chair with wife present, and they are eager to get home.         Therapy Documentation Precautions:  Precautions Precautions: Fall Precaution Comments: Severely impaired LUE/LLE sensation Restrictions Weight Bearing Restrictions: No Vital Signs:   Pain: Pain Assessment Pain Score: 0-No pain  See FIM for current functional status  Therapy/Group: Individual Therapy Jack [name], PT, DPT  08/28/2013, 10:37 AM

## 2013-08-28 NOTE — Progress Notes (Addendum)
Subjective/Complaints: 80 y.o. left-handed male with history of atrial fibrillation, hypertension, PFO, CVA 2009 not on anticoagulations and very little residual weakness. Admitted 08/02/2013 with left-sided weakness. Cranial CT scan showed 17 x 21 hemorrhagic infarcts posterior right basal ganglia and progression of microvascular disease. His his systolic blood pressure 644/03 on arrival. Follow cranial CT scan 08/04/2013 with stable hypertensive hemorrhage that's stable and mild vasogenic edema. Patient enrolled in ATACH-II study for BP control.  Wife not coming in today because of snow Review of Systems - Negative except Left side weak  Objective: Vital Signs: Blood pressure 124/69, pulse 67, temperature 98.2 F (36.8 C), temperature source Oral, resp. rate 17, height 5\' 3"  (1.6 m), weight 64.6 kg (142 lb 6.7 oz), SpO2 97.00%. No results found. No results found for this or any previous visit (from the past 72 hour(s)).    Vitals reviewed.  Constitutional: He is oriented to person, place, and time. No distress   Cardiovascular:  Rate controlled, irregular rhythm Respiratory: Effort normal and breath sounds normal. No respiratory distress. No wheezes or rales  GI: Soft. Bowel sounds are normal. He exhibits no distension.  Neurological: He is alert and oriented to person, place, and time.  Flat affect. Follows commands. Speech is mildly dysarthric but intelligible. LUE is grossly 4/5 moderate ataxia. LLE is better at 3+/5 HF, KE and 4- ADF/APF. improved LT LUE > LLE, proprioception intact LLE. Limited insight and awareness.  Skin: Skin is warm and dry.    Assessment/Plan: 1. Functional deficits secondary to Right basal ganglia hemmorrhagic infarct which require 3+ hours per day of interdisciplinary therapy in a comprehensive inpatient rehab setting. Physiatrist is providing close team supervision and 24 hour management of active medical problems listed below. Physiatrist and rehab team  continue to assess barriers to discharge/monitor patient progress toward functional and medical goals.   FIM: FIM - Bathing Bathing Steps Patient Completed: Chest;Right Arm;Left Arm;Abdomen;Front perineal area;Buttocks;Right upper leg;Left upper leg;Right lower leg (including foot);Left lower leg (including foot) Bathing: 5: Supervision: Safety issues/verbal cues  FIM - Upper Body Dressing/Undressing Upper body dressing/undressing steps patient completed: Thread/unthread left sleeve of front closure shirt/dress;Thread/unthread right sleeve of front closure shirt/dress;Thread/unthread right sleeve of pullover shirt/dresss;Thread/unthread left sleeve of pullover shirt/dress;Put head through opening of pull over shirt/dress;Pull shirt over trunk;Pull shirt around back of front closure shirt/dress;Button/unbutton shirt Upper body dressing/undressing: 5: Set-up assist to: Obtain clothing/put away FIM - Lower Body Dressing/Undressing Lower body dressing/undressing steps patient completed: Thread/unthread right underwear leg;Thread/unthread left underwear leg;Thread/unthread right pants leg;Thread/unthread left pants leg;Don/Doff right shoe;Pull underwear up/down;Pull pants up/down;Don/Doff right sock;Don/Doff left sock;Don/Doff left shoe Lower body dressing/undressing: 4: Steadying Assist  FIM - Toileting Toileting steps completed by patient: Adjust clothing prior to toileting;Performs perineal hygiene;Adjust clothing after toileting Toileting Assistive Devices: Grab bar or rail for support Toileting: 5: Supervision: Safety issues/verbal cues  FIM - Radio producer Devices: Insurance account manager Transfers: 4-To toilet/BSC: Min A (steadying Pt. > 75%);4-From toilet/BSC: Min A (steadying Pt. > 75%)  FIM - Control and instrumentation engineer Devices: Orthosis;Arm rests Bed/Chair Transfer: 4: Chair or W/C > Bed: Min A (steadying Pt. > 75%);4: Bed > Chair or W/C: Min A  (steadying Pt. > 75%)  FIM - Locomotion: Wheelchair Distance: 150 Locomotion: Wheelchair: 1: Total Assistance/staff pushes wheelchair (Pt<25%) FIM - Locomotion: Ambulation Locomotion: Ambulation Assistive Devices: Other (comment);Orthosis;Walker - Rolling (L AFO and L hand splint) Ambulation/Gait Assistance: 4: Min assist Locomotion: Ambulation: 2: Travels 50 - 149 ft with minimal  assistance (Pt.>75%)  Comprehension Comprehension Mode: Auditory Comprehension: 6-Follows complex conversation/direction: With extra time/assistive device  Expression Expression Mode: Verbal Expression: 5-Expresses basic needs/ideas: With no assist  Social Interaction Social Interaction: 6-Interacts appropriately with others with medication or extra time (anti-anxiety, antidepressant).  Problem Solving Problem Solving: 5-Solves complex 90% of the time/cues < 10% of the time  Memory Memory: 5-Recognizes or recalls 90% of the time/requires cueing < 10% of the time   Medical Problem List and Plan:  Hemorrhagic right basal ganglia infarcts, left sensory ataxia improving 1. DVT Prophylaxis/Anticoagulation: Pharmaceutical: Lovenox , on baby ASA without sign of neuro decline 2. Pain Management: N/A  3. Mood: No signs of distress noted. LCSW to follow for evaluation.  4. Neuropsych: This patient is capable of making decisions on his own behalf.  5. HTN: Monitor with qid checks. Continue HCTZ, Avapro and Norvasc. Mild hypo K, supplement 6. BPH: continue proscar. Trial flomax at noc  LOS (Days) 21 A FACE TO FACE EVALUATION WAS PERFORMED  KIRSTEINS,ANDREW E 08/28/2013, 9:25 AM

## 2013-08-28 NOTE — Progress Notes (Signed)
Occupational Therapy Session Note  Patient Details  Name: Jack [name] MRN: [id number] Date of Birth: [birth year]  Today's Date: 08/28/2013 Time: 0802-0900 and 1100-1130 Time Calculation (min): 58 min and 30 min  Short Term Goals: Week 1:  OT Short Term Goal 1 (Week 1): Pt will be able to maintain static standing balance with min A to increase his ability to manage clothing over his hips. OT Short Term Goal 1 - Progress (Week 1): Progressing toward goal OT Short Term Goal 2 (Week 1): Pt will be able to don shirt with supervision. OT Short Term Goal 2 - Progress (Week 1): Met OT Short Term Goal 3 (Week 1): Pt will be able to don pants with mod A. OT Short Term Goal 3 - Progress (Week 1): Met OT Short Term Goal 4 (Week 1): Pt will be able to transfer to toilet with mod A x1. OT Short Term Goal 4 - Progress (Week 1): Met OT Short Term Goal 5 (Week 1): Pt will demonstrate increased functional use of his L hand by holding wash cloth and washing R arm. OT Short Term Goal 5 - Progress (Week 1): Met Week 2:  OT Short Term Goal 1 (Week 2): Pt will be able to stand with min A while adjusting clothing over his hips.  OT Short Term Goal 1 - Progress (Week 2): Met OT Short Term Goal 2 (Week 2): Pt will be able to fully pull his underwear and pants over hips. OT Short Term Goal 2 - Progress (Week 2): Met OT Short Term Goal 3 (Week 2): Pt will demonstrate improved L hand control by using it to pull on his socks with mod A. OT Short Term Goal 3 - Progress (Week 2): Met OT Short Term Goal 4 (Week 2): Pt will be able to recall the steps of his safe squat pivot transfer with min cues. OT Short Term Goal 4 - Progress (Week 2): Met Week 3:  OT Short Term Goal 1 (Week 3): STG = LTGs due to remaining LOS  Skilled Therapeutic Interventions/Progress Updates:    Visit 1:  Pain: no c/o pain  Tx:    Pt seen for BADL retraining of toileting, bathing, and dressing with a focus on ambulation with RW as pt will have to  use walker to access his bathroom at home. Pt did quite well today, but needed occasional cues and steadying assist with ambulation as he tends to drag L foot and begin to advance forward into a step prior to safely advancing LLE.    He was able to toilet (doff briefs and clean up) with close Supervision, bathe on tub bench with close Supervision.  He needed steady a as he donned pants in standing.  He was also able to don L shoe and brace with shoe horn with extra time.  Much improved L hand control with dressing and managing small containers in grooming. Pt resting in chair at end of session with call light in reach.  Visit 2: Pain: No c/o pain. Tx:  Pt seen this session for LUE neuro re-ed sitting on EOM with [facility] and [place] activities using PVC pipe design tree with a focus on bilateral integration with attaching the pieces. Pt followed the pattern well and was able to attach the pieces together with slight difficulty.  Pt then worked on L tip and lateral pinch strength and coordination with fastening resistive clothespins.  Pt then worked on bilateral ball holds with reaching both arms overhead,  forward and side to side.  Pt's LUE coordination has improved a great deal and he is using it actively all the time without cues. Pt returned to his room, resting in w/c, with call light in reach.     Therapy Documentation Precautions:  Precautions Precautions: Fall Precaution Comments: Severely impaired LUE/LLE sensation Restrictions Weight Bearing Restrictions: No      Pain: Pain Assessment Pain Score: 0-No pain ADL:  See FIM for current functional status  Therapy/Group: Individual Therapy  Norris Bodley 08/28/2013, 9:12 AM

## 2013-08-29 ENCOUNTER — Inpatient Hospital Stay (HOSPITAL_COMMUNITY): Payer: Medicare Other

## 2013-08-29 ENCOUNTER — Inpatient Hospital Stay (HOSPITAL_COMMUNITY): Payer: Medicare Other | Admitting: Occupational Therapy

## 2013-08-29 NOTE — Progress Notes (Signed)
Subjective/Complaints: 80 y.o. left-handed male with history of atrial fibrillation, hypertension, PFO, CVA 2009 not on anticoagulations and very little residual weakness. Admitted 08/02/2013 with left-sided weakness. Cranial CT scan showed 17 x 21 hemorrhagic infarcts posterior right basal ganglia and progression of microvascular disease. His his systolic blood pressure 915/05 on arrival. Follow cranial CT scan 08/04/2013 with stable hypertensive hemorrhage that's stable and mild vasogenic edema. Patient enrolled in ATACH-II study for BP control.  Wife not coming in today because of snow Review of Systems - Negative except Left side weak  Objective: Vital Signs: Blood pressure 148/83, pulse 78, temperature 98.3 F (36.8 C), temperature source Oral, resp. rate 17, height '5\' 3"'  (1.6 m), weight 67.2 kg (148 lb 2.4 oz), SpO2 95.00%. No results found. No results found for this or any previous visit (from the past 72 hour(s)).    Vitals reviewed.  Constitutional: He is oriented to person, place, and time. No distress   Cardiovascular:  Rate controlled, irregular rhythm Respiratory: Effort normal and breath sounds normal. No respiratory distress. No wheezes or rales  GI: Soft. Bowel sounds are normal. He exhibits no distension.  Neurological: He is alert and oriented to person, place, and time.  Flat affect. Follows commands. Speech is mildly dysarthric but intelligible. LUE is grossly 4/5 moderate ataxia. LLE is better at 3+/5 HF, KE and 4- ADF/APF. improved LT LUE > LLE, proprioception intact LLE. Limited insight and awareness.  Skin: Skin is warm and dry.    Assessment/Plan: 1. Functional deficits secondary to Right basal ganglia hemmorrhagic infarct which require 3+ hours per day of interdisciplinary therapy in a comprehensive inpatient rehab setting. Physiatrist is providing close team supervision and 24 hour management of active medical problems listed below. Physiatrist and rehab team  continue to assess barriers to discharge/monitor patient progress toward functional and medical goals.  Team conference today please see physician documentation under team conference tab, met with team face-to-face to discuss problems,progress, and goals. Formulized individual treatment plan based on medical history, underlying problem and comorbidities. Should be ready for D/C in am  FIM: FIM - Bathing Bathing Steps Patient Completed: Chest;Right Arm;Left Arm;Abdomen;Front perineal area;Buttocks;Right upper leg;Left upper leg;Right lower leg (including foot);Left lower leg (including foot) Bathing: 5: Supervision: Safety issues/verbal cues  FIM - Upper Body Dressing/Undressing Upper body dressing/undressing steps patient completed: Thread/unthread left sleeve of front closure shirt/dress;Thread/unthread right sleeve of front closure shirt/dress;Thread/unthread right sleeve of pullover shirt/dresss;Thread/unthread left sleeve of pullover shirt/dress;Put head through opening of pull over shirt/dress;Pull shirt over trunk;Pull shirt around back of front closure shirt/dress;Button/unbutton shirt Upper body dressing/undressing: 5: Set-up assist to: Obtain clothing/put away FIM - Lower Body Dressing/Undressing Lower body dressing/undressing steps patient completed: Thread/unthread right underwear leg;Thread/unthread left underwear leg;Thread/unthread right pants leg;Thread/unthread left pants leg;Don/Doff right shoe;Pull underwear up/down;Pull pants up/down;Don/Doff right sock;Don/Doff left sock;Don/Doff left shoe;Fasten/unfasten left shoe;Fasten/unfasten right shoe Lower body dressing/undressing: 5: Supervision: Safety issues/verbal cues  FIM - Toileting Toileting steps completed by patient: Adjust clothing prior to toileting;Performs perineal hygiene;Adjust clothing after toileting Toileting Assistive Devices: Grab bar or rail for support Toileting: 5: Supervision: Safety issues/verbal cues  FIM -  Radio producer Devices: Insurance account manager Transfers: 4-To toilet/BSC: Min A (steadying Pt. > 75%);4-From toilet/BSC: Min A (steadying Pt. > 75%)  FIM - Control and instrumentation engineer Devices: Orthosis;Arm rests Bed/Chair Transfer: 5: Supine > Sit: Supervision (verbal cues/safety issues);5: Bed > Chair or W/C: Supervision (verbal cues/safety issues)  FIM - Locomotion: Wheelchair Distance: 150 Locomotion: Wheelchair:  5: Travels 150 ft or more: maneuvers on rugs and over door sills with supervision, cueing or coaxing FIM - Locomotion: Ambulation Locomotion: Ambulation Assistive Devices: Walker - Rolling;Orthosis Ambulation/Gait Assistance: 4: Min assist Locomotion: Ambulation: 2: Travels 50 - 149 ft with minimal assistance (Pt.>75%)  Comprehension Comprehension Mode: Auditory Comprehension: 6-Follows complex conversation/direction: With extra time/assistive device  Expression Expression Mode: Verbal Expression: 5-Expresses basic needs/ideas: With no assist  Social Interaction Social Interaction: 6-Interacts appropriately with others with medication or extra time (anti-anxiety, antidepressant).  Problem Solving Problem Solving: 5-Solves complex 90% of the time/cues < 10% of the time  Memory Memory: 5-Recognizes or recalls 90% of the time/requires cueing < 10% of the time   Medical Problem List and Plan:  Hemorrhagic right basal ganglia infarcts, left sensory ataxia improving 1. DVT Prophylaxis/Anticoagulation: Pharmaceutical: Lovenox , on baby ASA without sign of neuro decline 2. Pain Management: N/A  3. Mood: No signs of distress noted. LCSW to follow for evaluation.  4. Neuropsych: This patient is capable of making decisions on his own behalf.  5. HTN: Monitor with qid checks. Continue HCTZ, Avapro and Norvasc. Mild hypo K, supplement 6. BPH: continue proscar. Trial flomax at noc  LOS (Days) [place] EVALUATION WAS  PERFORMED  KIRSTEINS,ANDREW E 08/29/2013, 9:53 AM

## 2013-08-29 NOTE — Discharge Instructions (Signed)
Inpatient Rehab Discharge Instructions  Jack [name] Discharge date and time:  08/30/13  Activities/Precautions/ Functional Status: Activity: activity as tolerated Diet: regular diet Wound Care: none needed  Functional status:  ___ No restrictions     ___ Walk up steps independently _X__ 24/7 supervision/assistance   ___ Walk up steps with assistance ___ Intermittent supervision/assistance  ___ Bathe/dress independently _X__ Walk with walker    _X__ Bathe/dress with assistance ___ Walk Independently    ___ Shower independently _X__ Walk with assistance    ___ Shower with assistance _X__ No alcohol     ___ Return to work/school ________  Special Instructions:     COMMUNITY REFERRALS UPON DISCHARGE:    Home Health:   PT, OT, [place] KWIOX:[phone] Date of last service:08/30/2013   Medical Equipment/Items [place]     [phone]   GENERAL COMMUNITY RESOURCES FOR PATIENT/FAMILY: Support Groups:[facility] SUPPORT GROUP  My questions have been answered and I understand these instructions. I will adhere to these goals and the provided educational materials after my discharge from the hospital.  Patient/Caregiver Signature _______________________________ Date __________  Clinician Signature _______________________________________ Date __________  Please bring this form and your medication list with you to all your follow-up doctor's appointments.

## 2013-08-29 NOTE — Progress Notes (Signed)
Occupational Therapy Discharge Summary  Patient Details  Name: Jack [name] MRN: [id number] Date of Birth: 07/09/1934  Today's Date: 08/29/2013  Patient has met 8 of 11 long term goals due to improved activity tolerance, improved balance, postural control, ability to compensate for deficits, functional use of  LEFT upper and LEFT lower extremity, improved attention, improved awareness and improved coordination.  Patient to discharge at overall Supervision level for self care and steady A for bathroom transfers.  Patient's care partner is independent to provide the necessary physical assistance at discharge.    Reasons goals not met: Pt has supervision goals for dynamic standing balance, transfers to toilet and shower chair.  He is able to do squat pivot transfers with supervision but has to use RW into his bathroom at home.  He continues to need contact guard/ steady A at times to toilet and all the time to step over shower stall ledge.  Recommendation:  Patient will benefit from ongoing skilled OT services in outpatient setting to continue to advance functional skills in the area of BADL and iADL.  Equipment: n/a  Reasons for discharge: treatment goals met  Patient/family agrees with progress made and goals achieved: Yes  OT Discharge ADL  refer to FIM Vision/Perception  Vision - History Patient Visual Report: No change from baseline Vision - Assessment Eye Alignment: Within Functional Limits Perception Perception: Impaired Inattention/Neglect: Does not attend to left visual field (Pt has made dramatic improvements, but needs cues 25% of time) Praxis Praxis: Intact  Cognition Overall Cognitive Status: Within Functional Limits for tasks assessed Orientation Level: Oriented X4 Sensation Sensation Light Touch: Impaired Detail Light Touch Impaired Details: Impaired LUE (localization has improved, 50% accuracy in LUE) Stereognosis: Appears Intact Hot/Cold: Appears  Intact Proprioception Impaired Details: Impaired LLE (has some kinesthesia LLE) Coordination Gross Motor Movements are Fluid and Coordinated: No Fine Motor Movements are Fluid and Coordinated: No Coordination and Movement Description: His coordination has improved greatly as he is able to open containers and fasten buttons, but it is delayed and not WNL Finger Nose Finger Test: He can reach hand to nose with increased accuracy, but 50% slower in L than R. Heel Shin Test: slightly impaired accuracy, but normal speed and excursion Motor  Motor Motor: Abnormal postural alignment and control;Motor impersistence;Hemiplegia Motor - Discharge Observations: pt has isolated movements at all joints of LLE  Mobility - refer to FIM Bed Mobility Bed Mobility:  (modified indpedent for all)  Trunk/Postural Assessment  Cervical Assessment Cervical Assessment: Within Functional Limits Thoracic Assessment Thoracic Assessment: Within Functional Limits Lumbar Assessment Lumbar Assessment: Within Functional Limits Postural Control Postural Control: Deficits on evaluation Postural Limitations: leans to right in sitting when focused on bil UE task; able to self correct with 1 verbal cue  Balance Balance Balance Assessed: Yes Dynamic Sitting Balance Sitting balance - Comments: supervision Static Standing Balance Static Standing - Level of Assistance: 5: Stand by assistance Extremity/Trunk Assessment RUE Assessment RUE Assessment: Within Functional Limits LUE AROM (degrees) Left Shoulder Flexion: 180 Degrees LUE Strength Left Shoulder Flexion: 3+/5 Left Elbow Flexion: 5/5 Left Elbow Extension: 5/5 Gross Grasp: Functional  See FIM for current functional status  SAGUIER,JULIA 08/29/2013, 12:19 PM  

## 2013-08-29 NOTE — Progress Notes (Signed)
Physical Therapy Discharge Summary  Patient Details  Name: Jack [name] MRN: [id number] Date of Birth: [birth year]  Today's Date: 08/29/2013 Time: 3893-7342 Time Calculation (min): 45 min  Patient has met 11 of 11 long term goals due to improved balance, improved postural control, increased strength, ability to compensate for deficits, functional use of  left upper extremity and left lower extremity, improved attention and improved awareness.  Patient to discharge at an ambulatory level [place].   Patient's care partner is independent to provide the necessary physical assistance at discharge.  Reasons goals not met: n/a  Recommendation:  Patient will benefit from ongoing skilled PT services in home health setting at d/c,  transitioning to [place], to continue to advance safe functional mobility, address ongoing impairments in LLE timing and sequencing, cognition, coordination, balance, functional mobility, locomotion and minimize fall risk.  Equipment: RW, L hand splint, L [place] AFO  Reasons for discharge: treatment goals met and discharge from hospital  Patient/family agrees with progress made and goals achieved: Yes  Treatment today: tx 1:   family ed for bed mobility, basic transfers, 6 consecutive steps with L or R rail  Gait with RW, L AFO x 100' with 2 turns to L, with 3-4 instances of pt needed wt shifting assistance due to pt forgetting sequence (stepping R foot before L).  Tx 2:   Wife observed floor transfer to floor mat with min assist for fall recovery.  Discussed fall precautions with pt and wife.  Family ed for gait x 50' min assist, with wife; safe despite pt forgetting sequence several times.  Gait up/down curb to simulate threshold entrance at pt's home; wife observed. Pt's son will be present when pt enters home at d/c.  neuromuscular re-education via demo, VCs for sit>< stand while holding football between knees to activate core.  PT  Discharge Precautions/Restrictions Precautions Precautions: Fall Precaution Comments:  impaired LUE/LLE sensation Restrictions Weight Bearing Restrictions: No   Pain Pain Assessment Pain Assessment: No/denies pain Pain Score: 0-No pain Vision/Perception - wears bifocals at all times    Cognition Orientation Level: Oriented X4 Sensation Sensation Light Touch: Impaired Detail Light Touch Impaired Details: Absent LLE Proprioception Impaired Details: Impaired LLE (has some kinesthesia LLE) Coordination Heel Shin Test: slightly impaired accuracy, but normal speed and excursion Motor  Motor Motor: Abnormal postural alignment and control;Motor impersistence;Hemiplegia Motor - Discharge Observations: pt has isolated movements at all joints of LLE  Mobility Bed Mobility Bed Mobility:  (modified indpedent for all) Transfers Transfers: Yes Squat Pivot Transfers: 5: Supervision Squat Pivot Transfer Details: Verbal cues for precautions/safety Squat Pivot Transfer Details (indicate cue type and reason): occasionally needs cue for placement of L foot Locomotion  Ambulation Ambulation: Yes Ambulation/Gait Assistance: 4: Min assist Ambulation Distance (Feet): 100 Feet Assistive device: Rolling walker (L hand orthosis) Ambulation/Gait Assistance Details: Manual facilitation for weight shifting;Verbal cues for sequencing Ambulation/Gait Assistance Details: variable L step length Gait Gait: Yes Gait Pattern: Impaired Gait Pattern: Step-through pattern;Decreased step length - right;Decreased hip/knee flexion - left;Decreased dorsiflexion - left;Left genu recurvatum;Trendelenburg;Narrow base of support;Trunk rotated posteriorly on left Gait velocity: reduced Stairs / Additional Locomotion Stairs: Yes Stairs Assistance: 4: Min assist Stairs Assistance Details: Verbal cues for sequencing Stairs Assistance Details (indicate cue type and reason): needs 1 cue to initiate correct body  orientation when managing steps with L vs R rail, as in home sitiation Stair Management Technique: One rail Right;One rail Left;Step to pattern;Sideways;Backwards;Forwards (R rail: forwards up/backwards down; L  rail: sideways up/sideways down) Number of Stairs: 6 Height of Stairs: 8 Wheelchair Mobility Wheelchair Mobility: Yes Wheelchair Assistance: 5: Investment banker, operational Details: Verbal cues for Marketing executive: Right upper extremity;Right lower extremity Wheelchair Parts Management: Needs assistance Distance: 150  Trunk/Postural Assessment  Cervical Assessment Cervical Assessment: Within Functional Limits Thoracic Assessment Thoracic Assessment: Within Functional Limits Lumbar Assessment Lumbar Assessment: Within Functional Limits Postural Control Postural Control: Deficits on evaluation Postural Limitations: leans to right in sitting when focused on bil UE task; able to self correct with 1 verbal cue  Balance Balance Balance Assessed: Yes Dynamic Sitting Balance Sitting balance - Comments: supervision Static Standing Balance Static Standing - Level of Assistance: 5: Stand by assistance Extremity Assessment      RLE Assessment RLE Assessment: Within Functional Limits LLE Strength LLE Overall Strength Comments: grossly 4/5 in sitting: hip flex/abd/add, knee flex/ext, ankle DF  See FIM for current functional status  Jack [name] 08/29/2013, 12:03 PM

## 2013-08-29 NOTE — Patient Care Conference (Signed)
Inpatient RehabilitationTeam Conference and Plan of Care Update Date: 08/29/2013   Time: 10:30 AM    Patient Name: Jack [name]      Medical Record Number: [id number]  Date of Birth: Oct 16, 1933 Sex: Male         Room/Bed: 4M09C/4M09C-01 Payor Info: Payor: Marine scientist / Plan: AARP MEDICARE COMPLETE / Product Type: *No Product type* /    Admitting Diagnosis: CVA  Admit Date/Time:  08/07/2013  6:17 PM Admission Comments: No comment available   Primary Diagnosis:  Bleeding in brain due to blood pressure disorder Principal Problem: Bleeding in brain due to blood pressure disorder  Patient Active Problem List   Diagnosis Date Noted  . Bleeding in brain due to blood pressure disorder 08/07/2013  . ICH (intracerebral hemorrhage) 08/02/2013  . Acute hemorrhagic infarction of brain 08/02/2013  . Hx of mitral valve repair   . History of stroke   . Sleep apnea 07/02/2011  . History of atrial fibrillation   . Cyst and pseudocyst of pancreas 09/13/2007  . Hypertension   . Premature ventricular contraction   . Hiatal hernia   . History of renal cyst   . BPH (benign prostatic hypertrophy)     Expected Discharge Date: Expected Discharge Date: 08/30/13  Team Members Present: Physician leading conference: Dr. Alysia Penna Social Worker Present: Alfonse Alpers, LCSW;Becky Lenor Provencher, LCSW Nurse Present: Other (comment) Loree Fee Reardon-RN) PT Present: Georjean Mode, PT;Other (comment) Jilda Roche) OT Present: Antony Salmon, Roland Earl, OT SLP Present: Germain Osgood, SLP PPS Coordinator present : Ileana Ladd, Lelan Pons, RN, CRRN     Current Status/Progress Goal Weekly Team Focus  Medical   Sensory deficits improving  maximize use of LUE  equipment planning   Bowel/Bladder   Continent of bowel and bladder; wears condom cath QHS; refusing laxative due to loose stools  Remain continent of bowel and bladder  Monitor need for laxative   Swallow/Nutrition/  Hydration     [facility]        ADL's   close supervision with bathing, set up grooming and UB dressing, steady A with clothing management only during LB dressing and toileting, steady A with RW transfers, good progress with LUE coordination and strength  supervision overall  ADL retraining, family ed, LUE neuro re-ed, functional mobility   Mobility   Min A basic transfers, gait with RW, and stairs; S WC and bed mobility,  S transfers and bed mob, Min A gait and stairs, S WC in community   Family ed, varyign transfers, gait with RW in varying conditions, stairs, community access   Communication     [facility]        Safety/Cognition/ Behavioral Observations    No unsafe behaviors        Pain   No complaints of pain  < 3  Assess and treat pain q shift and prn   Skin   Rash to upper back- doesnt seem to bother patient; no other skin issues noted  No skin breakdown or infection  Assess patients skin q shift and prn; continue powder for rash on back      *See Care Plan and progress notes for long and short-term goals.  Barriers to Discharge: see above    Possible Resolutions to Barriers:  DME, D/C in am, f/u [facility]    Discharge Planning/Teaching Needs:  Home with his wife who has been here and participated in therapies with him, both feel prepared to go home.  Team Discussion:  Met goals of supervision/min level.  Family education completed and both pt and wife feel ready for discharge tomorrow.  Refusing laxatives-having BM's according to RN  Revisions to Treatment Plan:  Ready for discharge   Continued Need for Acute Rehabilitation Level of Care: The patient requires daily medical management by a physician with specialized training in physical medicine and rehabilitation for the following conditions: Daily direction of a multidisciplinary physical rehabilitation program to ensure safe treatment while eliciting the highest outcome that is of practical value to the patient.: Yes Daily medical  management of patient stability for increased activity during participation in an intensive rehabilitation regime.: Yes Daily analysis of laboratory values and/or radiology reports with any subsequent need for medication adjustment of medical intervention for : Neurological problems  Tashi Andujo, Gardiner Rhyme 08/29/2013, 1:55 PM

## 2013-08-29 NOTE — Progress Notes (Signed)
Occupational Therapy Session Note  Patient Details  Name: GRIFFYN KUCINSKI MRN: [id number] Date of Birth: 1934-04-25  Today's Date: 08/29/2013 Time: 0800-0900 and 1100-1130 Time Calculation (min): 60 min and 30 min  Short Term Goals: Week 1:  OT Short Term Goal 1 (Week 1): Pt will be able to maintain static standing balance with min A to increase his ability to manage clothing over his hips. OT Short Term Goal 1 - Progress (Week 1): Progressing toward goal OT Short Term Goal 2 (Week 1): Pt will be able to don shirt with supervision. OT Short Term Goal 2 - Progress (Week 1): Met OT Short Term Goal 3 (Week 1): Pt will be able to don pants with mod A. OT Short Term Goal 3 - Progress (Week 1): Met OT Short Term Goal 4 (Week 1): Pt will be able to transfer to toilet with mod A x1. OT Short Term Goal 4 - Progress (Week 1): Met OT Short Term Goal 5 (Week 1): Pt will demonstrate increased functional use of his L hand by holding wash cloth and washing R arm. OT Short Term Goal 5 - Progress (Week 1): Met Week 2:  OT Short Term Goal 1 (Week 2): Pt will be able to stand with min A while adjusting clothing over his hips.  OT Short Term Goal 1 - Progress (Week 2): Met OT Short Term Goal 2 (Week 2): Pt will be able to fully pull his underwear and pants over hips. OT Short Term Goal 2 - Progress (Week 2): Met OT Short Term Goal 3 (Week 2): Pt will demonstrate improved L hand control by using it to pull on his socks with mod A. OT Short Term Goal 3 - Progress (Week 2): Met OT Short Term Goal 4 (Week 2): Pt will be able to recall the steps of his safe squat pivot transfer with min cues. OT Short Term Goal 4 - Progress (Week 2): Met Week 3:  OT Short Term Goal 1 (Week 3): STG = LTGs due to remaining LOS      Skilled Therapeutic Interventions/Progress Updates:    Visit 1: Pain: No c/o pain.  Tx:    Pt seen for BADL retraining of toileting, bathing, and dressing with a focus on functional transfers and use of  LUE. Pt used RW from bed to toilet in room with steady A as he will have to do this at home.  He then used tub room to practice tub bench transfers in tub for his shower and completed dressing from his w/c.  Pt returned to room to complete grooming standing at sink with S. Pt is demonstrating a great deal of improvement with his L hand function and balance. Pt resting in chair at end of session with call light in reach.  Visit 2:  Pain: No c/o pain.   Tx: Pt seen with his wife for family education for a review of RW transfers and ambulating into bathroom for toilet transfers and stepping into shower stall for shower transfers. Pt needs light contact guard during these transfers to ensure safety. Therapist demonstrated 1x and then his wife return demonstrated the 2nd time.  His wife and pt felt confident about going home tomorrow and are pleased with his progress. They are comfortable with the transfers and do not have further questions as she has been involved in his therapy sessions on a daily basis.    Therapy Documentation Precautions:  Precautions Precautions: Fall Precaution Comments: Severely impaired LUE/LLE sensation  Restrictions Weight Bearing Restrictions: No    Vital Signs: Therapy Vitals Temp: 98.3 F (36.8 C) Temp src: Oral Pulse Rate: 78 Resp: 17 BP: 148/83 mmHg Patient Position, if appropriate: Lying Oxygen Therapy SpO2: 95 % O2 Device: None (Room air)  Pain Assessment Pain Assessment: No/denies pain Pain Score: 0-No pain ADL:  See FIM for current functional status  Therapy/Group: Individual Therapy  SAGUIER,JULIA 08/29/2013, 9:48 AM

## 2013-08-29 NOTE — Progress Notes (Signed)
Social Work Patient ID: Jack [name], male   DOB: 1934/06/26, 80 y.o.   MRN: [id number] Met with pt and wife to discuss team conference and preparing for going home tomorrow.  Both feel prepared and ready. Wife has completed education and is hopeful it will go well at home.  Pt plans to listen to her.  Will order a rolling walker and follow up therapies. No preferences for either.  Prepare for discharge tomorrow.

## 2013-08-30 DIAGNOSIS — I619 Nontraumatic intracerebral hemorrhage, unspecified: Secondary | ICD-10-CM

## 2013-08-30 DIAGNOSIS — I10 Essential (primary) hypertension: Secondary | ICD-10-CM

## 2013-08-30 DIAGNOSIS — I4891 Unspecified atrial fibrillation: Secondary | ICD-10-CM

## 2013-08-30 DIAGNOSIS — Z8673 Personal history of transient ischemic attack (TIA), and cerebral infarction without residual deficits: Secondary | ICD-10-CM

## 2013-08-30 MED ORDER — ACETAMINOPHEN 325 MG PO TABS: 325.0000 mg | ORAL_TABLET | ORAL | Status: DC | PRN

## 2013-08-30 MED ORDER — TAMSULOSIN HCL 0.4 MG PO CAPS: 0.4000 mg | ORAL_CAPSULE | Freq: Every day | ORAL | Status: DC

## 2013-08-30 MED ORDER — PANTOPRAZOLE SODIUM 40 MG PO TBEC: 40.0000 mg | DELAYED_RELEASE_TABLET | Freq: Every day | ORAL | Status: DC

## 2013-08-30 MED ORDER — POTASSIUM CHLORIDE CRYS ER 10 MEQ PO TBCR: 10.0000 meq | EXTENDED_RELEASE_TABLET | Freq: Every day | ORAL | Status: DC

## 2013-08-30 MED ORDER — SENNOSIDES-DOCUSATE SODIUM 8.6-50 MG PO TABS: 1.0000 | ORAL_TABLET | Freq: Two times a day (BID) | ORAL | Status: DC

## 2013-08-30 MED ORDER — ASPIRIN 81 MG PO TBEC: 81.0000 mg | DELAYED_RELEASE_TABLET | Freq: Every day | ORAL | Status: DC

## 2013-08-30 NOTE — Progress Notes (Signed)
Patient discharged home.  Left floor via wheelchair, escorted by nursing staff and family.  Patient and family verbalized understanding of discharge instructions, with Marlowe Sh[place], PA, no questions asked.  VSS.  Appears to be in no immediate distress at this time.  All patient belongings taken with patient.  Brita Romp, RN

## 2013-08-30 NOTE — Progress Notes (Signed)
Social Work Discharge Note Discharge Note  The overall goal for the admission was met for:   Discharge location: Yes-HOME WITH WIFE WHO CAN PROVIDE 24 HR CARE  Length of Stay: Yes-23 DAYS  Discharge activity level: Yes-SUPERVISION WITH CUES  Home/community participation: Yes  Services provided included: MD, RD, PT, OT, SLP, RN, CM, TR, Pharmacy and [place]: Private Insurance: [facility]  Follow-up services arranged: Home Health: [place] CARE-PT,OT,RN, DME: [facility] and Patient/Family has no preference for HH/DME agencies  Comments (or additional information):FAMILY [place] START OFF [place]  Patient/Family verbalized understanding of follow-up arrangements: Yes  Individual responsible for coordination of the follow-up plan: [name]-WIFE  Confirmed correct DME delivered: Elease Hashimoto 08/30/2013    Elease Hashimoto

## 2013-08-30 NOTE — Progress Notes (Signed)
Subjective/Complaints: 80 y.o. left-handed male with history of atrial fibrillation, hypertension, PFO, CVA 2009 not on anticoagulations and very little residual weakness. Admitted 08/02/2013 with left-sided weakness. Cranial CT scan showed 17 x 21 hemorrhagic infarcts posterior right basal ganglia and progression of microvascular disease. His his systolic blood pressure 409/81 on arrival. Follow cranial CT scan 08/04/2013 with stable hypertensive hemorrhage that's stable and mild vasogenic edema. Patient enrolled in ATACH-II study for BP control. Ready for D/C per pt Review of Systems - Negative except Left side weak  Objective: Vital Signs: Blood pressure 142/83, pulse 78, temperature 98.2 F (36.8 C), temperature source Oral, resp. rate 18, height 5\' 3"  (1.6 m), weight 67.2 kg (148 lb 2.4 oz), SpO2 97.00%. No results found. No results found for this or any previous visit (from the past 72 hour(s)).    Vitals reviewed.  Constitutional: He is oriented to person, place, and time. No distress   Cardiovascular:  Rate controlled, irregular rhythm Respiratory: Effort normal and breath sounds normal. No respiratory distress. No wheezes or rales  GI: Soft. Bowel sounds are normal. He exhibits no distension.  Neurological: He is alert and oriented to person, place, and time.  Flat affect. Follows commands. Speech is mildly dysarthric but intelligible. LUE is grossly 4/5 moderate ataxia. LLE is better at 3+/5 HF, KE and 4- ADF/APF. improved LT LUE > LLE, proprioception intact LLE. Limited insight and awareness.  Skin: Skin is warm and dry.    Assessment/Plan: 1. Functional deficits secondary to Right basal ganglia hemmorrhagic infarct  Stable for D/C today F/u PCP in 1-2 weeks F/u PM&R 3 weeks See D/C summary See D/C instructions  FIM: FIM - Bathing Bathing Steps Patient Completed: Chest;Right Arm;Left Arm;Abdomen;Front perineal area;Buttocks;Right upper leg;Left upper leg;Right lower  leg (including foot);Left lower leg (including foot) Bathing: 5: Supervision: Safety issues/verbal cues  FIM - Upper Body Dressing/Undressing Upper body dressing/undressing steps patient completed: Thread/unthread left sleeve of front closure shirt/dress;Thread/unthread right sleeve of front closure shirt/dress;Thread/unthread right sleeve of pullover shirt/dresss;Thread/unthread left sleeve of pullover shirt/dress;Put head through opening of pull over shirt/dress;Pull shirt over trunk;Pull shirt around back of front closure shirt/dress;Button/unbutton shirt Upper body dressing/undressing: 5: Set-up assist to: Obtain clothing/put away FIM - Lower Body Dressing/Undressing Lower body dressing/undressing steps patient completed: Thread/unthread right underwear leg;Thread/unthread left underwear leg;Thread/unthread right pants leg;Thread/unthread left pants leg;Don/Doff right shoe;Pull underwear up/down;Pull pants up/down;Don/Doff right sock;Don/Doff left sock;Don/Doff left shoe;Fasten/unfasten left shoe;Fasten/unfasten right shoe Lower body dressing/undressing: 5: Supervision: Safety issues/verbal cues  FIM - Toileting Toileting steps completed by patient: Adjust clothing prior to toileting;Performs perineal hygiene;Adjust clothing after toileting Toileting Assistive Devices: Grab bar or rail for support Toileting: 5: Supervision: Safety issues/verbal cues  FIM - Radio producer Devices: Insurance account manager Transfers: 4-From toilet/BSC: Min A (steadying Pt. > 75%);4-To toilet/BSC: Min A (steadying Pt. > 75%)  FIM - Control and instrumentation engineer Devices: Orthosis;Arm rests Bed/Chair Transfer: 5: Chair or W/C > Bed: Supervision (verbal cues/safety issues);5: Bed > Chair or W/C: Supervision (verbal cues/safety issues);6: Supine > Sit: No assist;6: Sit > Supine: No assist  FIM - Locomotion: Wheelchair Distance: 150 Locomotion: Wheelchair: 5: Travels 150 ft or  more: maneuvers on rugs and over door sills with supervision, cueing or coaxing FIM - Locomotion: Ambulation Locomotion: Ambulation Assistive Devices: Walker - Rolling;Orthosis (L hand orthosis, L AFO) Ambulation/Gait Assistance: 4: Min assist Locomotion: Ambulation: 2: Travels 50 - 149 ft with minimal assistance (Pt.>75%)  Comprehension Comprehension Mode: Auditory Comprehension: 6-Follows complex conversation/direction:  With extra time/assistive device  Expression Expression Mode: Verbal Expression: 5-Expresses basic needs/ideas: With extra time/assistive device  Social Interaction Social Interaction: 6-Interacts appropriately with others with medication or extra time (anti-anxiety, antidepressant).  Problem Solving Problem Solving: 6-Solves complex problems: With extra time  Memory Memory: 5-Recognizes or recalls 90% of the time/requires cueing < 10% of the time   Medical Problem List and Plan:  Hemorrhagic right basal ganglia infarcts, left sensory ataxia improving 1. DVT Prophylaxis/Anticoagulation: Pharmaceutical: Lovenox , on baby ASA without sign of neuro decline 2. Pain Management: N/A  3. Mood: No signs of distress noted. LCSW to follow for evaluation.  4. Neuropsych: This patient is capable of making decisions on his own behalf.  5. HTN: Monitor with qid checks. Continue HCTZ, Avapro and Norvasc. Mild hypo K, supplement 6. BPH: continue proscar.flomax at noc  LOS (Days) [place] EVALUATION WAS PERFORMED  Devonne Lalani E 08/30/2013, 8:14 AM

## 2013-08-31 NOTE — Discharge Summary (Signed)
Physician Discharge Summary  Patient ID: Jack [name] MRN: [id number] DOB/AGE: 1933/09/16 80 y.o.  Admit date: 08/07/2013 Discharge date: 08/31/2013  Discharge Diagnoses:  Principal Problem:   Bleeding in brain due to blood pressure disorder Active Problems:   Hypertension   History of atrial fibrillation   Discharged Condition: Stable   Labs:  Basic Metabolic Panel:    Component Value Date/Time   NA 145 08/21/2013 0531   K 4.0 08/21/2013 0531   CL 105 08/21/2013 0531   CO2 28 08/21/2013 0531   GLUCOSE 97 08/21/2013 0531   BUN 19 08/21/2013 0531   CREATININE 1.01 08/21/2013 0531   CREATININE 0.90 06/25/2011 1003   CALCIUM 8.8 08/21/2013 0531   GFRNONAA 70* 08/21/2013 0531   GFRAA 81* 08/21/2013 0531      CBC:    Component Value Date/Time   WBC 8.8 08/08/2013 0900   RBC 4.99 08/08/2013 0900   HGB 15.2 08/08/2013 0900   HCT 43.4 08/08/2013 0900   PLT 334 08/08/2013 0900   MCV 87.0 08/08/2013 0900   MCH 30.5 08/08/2013 0900   MCHC 35.0 08/08/2013 0900   RDW 13.7 08/08/2013 0900   LYMPHSABS 1.2 08/08/2013 0900   MONOABS 0.7 08/08/2013 0900   EOSABS 0.3 08/08/2013 0900   BASOSABS 0.0 08/08/2013 0900     CBG: No results found for this basename: GLUCAP,  in the last 168 hours  Brief HPI:   Jack [name] is a 80 y.o. left-handed male with history of atrial fibrillation, hypertension, PFO, CVA 2009 not on anticoagulations and very little residual weakness. Admitted 08/02/2013 with left-sided weakness. Cranial CT scan showed 17 x 21 hemorrhagic infarcts posterior right basal ganglia and progression of microvascular disease.  Follow cranial CT scan 08/04/2013 with stable hypertensive hemorrhage that's stable and mild vasogenic edema. Patient enrolled in ATACH-II study for BP control. . Patient with resultant left hemiparesis and dysphagia and CIR recommended by rehab team.    Hospital Course: Jack [name] was admitted to rehab 08/07/2013 for inpatient therapies to consist of PT, ST and OT  at least three hours five days a week. Past admission physiatrist, therapy team and rehab RN have worked together to provide customized collaborative inpatient rehab.  Blood pressures have been monitored on bid basis and reasonably controlled on HCTZ, Avapro and Norvasc. Mild hypokalemia was supplemented with resolution.  Po intake has been good and he's continent of bowel and bladder. Follow up head CT on 08/17/13 showed expected evolution and low dose ASA was resumed. He reported problems with hesitancy and therefore Proscar was resumed. He has had some improvement in left hemiparesis as well as improvement in coordination. He is showing improvement in ability to compensate for left inattention but continues to requires cues at times. He was fitted with an  Allard AFO to help with foot drop and gait quality. He has progressed to requiring supervision at wheelchair level and will continue with home health therapy past discharge.    Rehab course: During patient's stay in rehab weekly team conferences were held to monitor patient's progress, set goals and discuss barriers to discharge. Patient has had improvement in activity tolerance, balance, postural control, as well as ability to compensate for deficits. He is has had improvement in functional use LUE  and LLE as well as improved awareness. He requires supervision for transfers and is able to ambulate 100 feet with min assist and RW. He requires steady assist for bathing and dressing tasks and steady assist for  bathroom transfers. He was advanced to regular textures and is tolerating this without S/S of aspiration. He continues to have decreased recall of new information as well as problem solving but family reported cognition was baseline. ST signed off on 08/13/13 as patient had reached goals. Family eduction was done with wife regarding assistance needed with all aspects of mobility, car transfers and well as wheelchair management.     Disposition:  01-Home or Self Care  Diet:  Heart healthy.   Special Instructions:   Future Appointments Provider Department Dept Phone   09/04/2013 9:00 AM [facility] [phone]   09/10/2013 11:15 AM [facility] [phone]   09/28/2013 12:30 PM Charlett Blake, MD [facility] [phone]       Medication List    STOP taking these medications       sildenafil 50 MG tablet  Commonly known as:  VIAGRA      TAKE these medications       acetaminophen 325 MG tablet  Commonly known as:  TYLENOL  Take 1-2 tablets (325-650 mg total) by mouth every 4 (four) hours as needed for mild pain.     amLODipine 5 MG tablet  Commonly known as:  NORVASC  Take 5 mg by mouth daily.     aspirin 81 MG EC tablet  Take 1 tablet (81 mg total) by mouth daily.     finasteride 5 MG tablet  Commonly known as:  PROSCAR  Take 5 mg by mouth daily.     pantoprazole 40 MG tablet  Commonly known as:  PROTONIX  Take 1 tablet (40 mg total) by mouth daily.     potassium chloride 10 MEQ tablet  Commonly known as:  K-DUR,KLOR-CON  Take 1 tablet (10 mEq total) by mouth daily.     senna-docusate 8.6-50 MG per tablet  Commonly known as:  Senokot-S  Take 1 tablet by mouth 2 (two) times daily.     tamsulosin 0.4 MG Caps capsule  Commonly known as:  FLOMAX  Take 1 capsule (0.4 mg total) by mouth daily after supper.     valsartan-hydrochlorothiazide 160-12.5 MG per tablet  Commonly known as:  DIOVAN-HCT  Take 1 tablet by mouth daily.           Follow-up Information   Follow up with Charlett Blake, MD On 09/28/2013. (Be there at 12 noon  for 12:30 pm  appointment)    Specialty:  Physical Medicine and Rehabilitation   Contact information:   [place] [address] [phone]       Follow up with Forbes Cellar, MD. Call today. (for follow up in 6 weeks. )    Specialties:  Neurology,  Radiology   Contact information:   [address] [place] [address] [phone]       Follow up with Crisoforo Oxford, PA-C On 09/10/2013. (APPT @ 11;00 AM)    Specialty:  Family Medicine   Contact information:   [place] [address] [phone]       Signed: Bary Leriche 08/31/2013, 8:40 AM

## 2013-09-04 ENCOUNTER — Telehealth: Payer: Self-pay

## 2013-09-04 NOTE — Telephone Encounter (Signed)
Jack [name] a physical therapist with [facility] called to let us know that the patient notices blood in his urine.  He denies any back or abdominal pain.  He has increased his fluids.  Please advise.

## 2013-09-05 NOTE — Telephone Encounter (Signed)
Left message for Jack [name] with [facility] to order a UA with culture and sensitivity.

## 2013-09-05 NOTE — Telephone Encounter (Signed)
Order UA C and S

## 2013-09-10 ENCOUNTER — Encounter: Payer: Self-pay | Admitting: Medical

## 2013-09-10 ENCOUNTER — Ambulatory Visit (INDEPENDENT_AMBULATORY_CARE_PROVIDER_SITE_OTHER): Payer: Medicare Other | Admitting: Medical

## 2013-09-10 VITALS — BP 120/80 | HR 92 | Temp 98.3°F | Resp 12 | Wt 134.0 lb

## 2013-09-10 DIAGNOSIS — E876 Hypokalemia: Secondary | ICD-10-CM

## 2013-09-10 DIAGNOSIS — N453 Epididymo-orchitis: Secondary | ICD-10-CM

## 2013-09-10 DIAGNOSIS — I619 Nontraumatic intracerebral hemorrhage, unspecified: Secondary | ICD-10-CM

## 2013-09-10 DIAGNOSIS — N451 Epididymitis: Secondary | ICD-10-CM

## 2013-09-10 DIAGNOSIS — I4891 Unspecified atrial fibrillation: Secondary | ICD-10-CM

## 2013-09-10 DIAGNOSIS — N452 Orchitis: Secondary | ICD-10-CM

## 2013-09-10 DIAGNOSIS — I10 Essential (primary) hypertension: Secondary | ICD-10-CM

## 2013-09-10 LAB — BASIC METABOLIC PANEL
BUN: 12 mg/dL (ref 6–23)
CALCIUM: 9.3 mg/dL (ref 8.4–10.5)
CO2: 28 mEq/L (ref 19–32)
Chloride: 96 mEq/L (ref 96–112)
Creat: 0.76 mg/dL (ref 0.50–1.35)
Glucose, Bld: 90 mg/dL (ref 70–99)
Potassium: 4.3 mEq/L (ref 3.5–5.3)
SODIUM: 133 meq/L — AB (ref 135–145)

## 2013-09-10 MED ORDER — CIPROFLOXACIN HCL 500 MG PO TABS: 500.0000 mg | ORAL_TABLET | Freq: Two times a day (BID) | ORAL | Status: DC

## 2013-09-10 NOTE — Progress Notes (Signed)
   Subjective:   KARMAN VENEY is a 80 y.o. male presenting on 09/10/2013 with Follow-up and penis and testicle sore and bleeding  Here for hospital f/u.  Here with wife [name].  Was admitted to the hospital recently for hemorrhagic stroke, Afib, HTN and left leg and side weakness.  Still has residual left side and leg weakness.    He does have home health nurse and physical therapy coming out.  Has neurology f/u planned.  No cardiology f/u planned though.    He has no new weakness or other changes related to his presenting stroke symptoms.  However in the last few days having some issues with penis and scrotum.  He notes about a week ago had some pink urine, blood in urine, faint, and has had some persistent aching and swelling in scrotum.   Home health nurse called this morning and said his urine culture showed E coli.   Not currently on antibiotics, but the nursing home doctor is suppose to be sending out an antibiotic.  He though his prostate medications were causing the bleeding.  No other aggravating or relieving factors.  No other complaint.  Review of Systems ROS as in subjective      Objective:     Filed Vitals:   09/10/13 1141  BP: 120/80  Pulse: 92  Temp: 98.3 F (36.8 C)  Resp: 12   General appearance: alert, no distress, WD/WN, seated in chair, using cane, slowed pace coming in today. Neck: supple, no lymphadenopathy, no thyromegaly, no masses, no bruits Heart: rate seems to be afib, otherwise no murmurs Lungs: CTA bilaterally, no wheezes, rhonchi, or rales Abdomen: +bs, soft, non tender, non distended, no masses, no hepatomegaly, no splenomegaly Pulses: 1+ symmetric, upper and lower extremities, normal cap refill GU: tenderness of right scrotum, enlarged tender enlarged testicle, swollen tender right spermatic cord, otherwise no skin changes, no hernia, no other mass or tenderness Neuro: speech a little slurred and slower that last visit, weakness of left leg, antalgic  gait, otherwise CN2-12 intact, strength otherwise seems WNL     Assessment: Encounter Diagnoses  Name Primary?  . Stroke, hemorrhagic Yes  . A-fib   . Essential hypertension, benign   . Hypokalemia   . Orchitis   . Epididymitis      Plan: After speaking to both outpatient rehab office and cardiology, we will have him call to reschedule cardiology f/u with Dr. Wynonia Lawman.  I will begin him on Cipro for orchitis/epididymitis.  He has f/u planned with neurology.  He is continuing PT and has home health nurse coming out.   i answered his questions.  We discussed his recent stroke and limitations.   F/u with me in 10-14 days on scrotal concerns.  BMET today since started potassium.  Brennyn was seen today for follow-up and penis and testicle sore and bleeding.  Diagnoses and associated orders for this visit:  Stroke, hemorrhagic  A-fib  Essential hypertension, benign  Hypokalemia - Basic metabolic panel  Orchitis  Epididymitis  Other Orders - ciprofloxacin (CIPRO) 500 MG tablet; Take 1 tablet (500 mg total) by mouth 2 (two) times daily.     Return pending call back.

## 2013-09-11 ENCOUNTER — Telehealth: Payer: Self-pay | Admitting: Family Medicine

## 2013-09-11 NOTE — Telephone Encounter (Signed)
Message copied by Armanda Magic on Tue Sep 11, 2013  9:35 AM ------      Message from: Carlena Hurl      Created: Mon Sep 10, 2013  8:48 PM       I spoke to Dr. Wynonia Lawman today.   He needs to see him in f/u.  Please ask him/wife to make f/u appt with cardiology/Dr. Wynonia Lawman.              I want to see him back in 1 wk on the testicle/penis pain and swelling. ------

## 2013-09-11 NOTE — Telephone Encounter (Signed)
Patients wife is aware and she has made the follow up appointment for him on 09/17/13. CLS

## 2013-09-14 DIAGNOSIS — E785 Hyperlipidemia, unspecified: Secondary | ICD-10-CM

## 2013-09-14 DIAGNOSIS — I10 Essential (primary) hypertension: Secondary | ICD-10-CM

## 2013-09-14 DIAGNOSIS — N400 Enlarged prostate without lower urinary tract symptoms: Secondary | ICD-10-CM

## 2013-09-14 DIAGNOSIS — I69959 Hemiplegia and hemiparesis following unspecified cerebrovascular disease affecting unspecified side: Secondary | ICD-10-CM

## 2013-09-14 DIAGNOSIS — I4891 Unspecified atrial fibrillation: Secondary | ICD-10-CM

## 2013-09-17 ENCOUNTER — Encounter: Payer: Self-pay | Admitting: Cardiology

## 2013-09-17 ENCOUNTER — Ambulatory Visit (INDEPENDENT_AMBULATORY_CARE_PROVIDER_SITE_OTHER): Payer: Medicare Other | Admitting: Medical

## 2013-09-17 ENCOUNTER — Encounter: Payer: Self-pay | Admitting: Medical

## 2013-09-17 VITALS — BP 104/60 | HR 60 | Temp 98.1°F | Resp 20 | Wt 139.0 lb

## 2013-09-17 DIAGNOSIS — N452 Orchitis: Secondary | ICD-10-CM

## 2013-09-17 DIAGNOSIS — N451 Epididymitis: Secondary | ICD-10-CM

## 2013-09-17 DIAGNOSIS — N453 Epididymo-orchitis: Secondary | ICD-10-CM

## 2013-09-17 MED ORDER — CIPROFLOXACIN HCL 500 MG PO TABS: 500.0000 mg | ORAL_TABLET | Freq: Two times a day (BID) | ORAL | Status: DC

## 2013-09-17 NOTE — Progress Notes (Signed)
Patient ID: Jack [name], male   DOB: 06-15-1934, 80 y.o.   MRN: [id number]   Jack, [name]  Date of visit:  09/17/2013 DOB:  02-Jan-1934    Age:  33 yrs. Medical record number:  [id number]     Account number:  [id number] Primary Care Provider: Jill Alexanders C ____________________________ CURRENT DIAGNOSES  1. Arrhythmia-Atrial Fibrillation  2. Surgery-S/P Mitral valve Repair  3. Hyperlipidemia  4. Hypertension-Essential (Benign)  5. Mitral Valve Disorder  6. Unspecified Late Effects Of Cerebrovascular Disease ____________________________ ALLERGIES  No Known Drug Allergies ____________________________ MEDICATIONS  1. finasteride 5 mg Tablet, 1 p.o. daily  2. Viagra 100 mg tablet, Take as directed  3. amlodipine 5 mg tablet, 1 p.o. daily  4. amoxicillin 500 mg capsule, take 4 1 hour befor dental work or surgery  5. pantoprazole 40 mg tablet,delayed release, 1 p.o. daily  6. tamsulosin ER 0.4 mg capsule,extended release 24 hr, 1 p.o. daily  7. valsartan 320 mg-hydrochlorothiazide 12.5 mg tablet, 1 p.o. daily  8. aspirin 81 mg chewable tablet, 1 p.o. daily  9. ciprofloxacin 500 mg tablet, BID  10. potassium chloride ER 10 mEq tablet,extended release, 1 p.o. daily ____________________________ CHIEF COMPLAINTS  Followup of Arrhythmia-Atrial Fibrillation  recent stroke ____________________________ HISTORY OF PRESENT ILLNESS Patient seen for cardiac evaluation. He had a recent thalamic stroke on the right which caused left hemiparesis and also loss of feeling on that side. He is gaining strength on that side but is still quite numb and has to walk with a cane. He was severely hypertensive and noted to have a hemorrhagic infarction and was taken off of aspirin. He has a prior history of atrial fibrillation but was in sinus rhythm last year and has had a previous Maze procedure as well as amputation of his atrial appendage in the past at the time of his mitral valve repair. He had a previous stroke  in 2009 and was treated with Plavix. He denies angina and has no PND, orthopnea, syncope, or edema. His blood pressure is currently well controlled.  Extensive hospital records reviewed. 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 Roxy Manns;  Cardiology Procedures-Noninvasive:  echocardiogram July 2007, TEE June 2009, cardiac CT June 2009, event monitor June 2009, echocardiogram August 2011, echocardiogram October 2013, event monitor November 2013;  Cardiac Cath Results:  no significant disease, severe mitral regurgitation, pulmonary hypertension;  LVEF of 70% documented via echocardiogram on 02/11/2010,  CHADS Score:  4,  CHA2DS2-VASC Score:  5 ____________________________ CARDIO-PULMONARY TEST DATES EKG Date:  09/17/2013;   Cardiac Cath Date:  02/09/2006;  CABG: 02/11/2006;  Holter/Event Monitor Date: 05/12/2012;  Echocardiography Date: 05/10/2012;  CT Scan Date:  12/25/2007   ____________________________ FAMILY HISTORY Brother -- Brother alive and well Brother -- Bowel cancer, Brother dead Brother -- Brother alive and well Father -- Coronary Artery Disease, Father dead Mother -- Unknown Disease, Mother dead Sister -- Blood disorder, Sister dead ____________________________ SOCIAL HISTORY Alcohol Use:  no alcohol use;  Smoking:  never smoked;  Diet:  low fat Diet;  Lifestyle:  married;  Exercise:  walking;  Occupation:  retired, [employer] and now  drives for [employer];  Residence:  lives with wife;    ____________________________ REVIEW OF SYSTEMS General:  feels well, no change in exercise tolerance. Eyes: wears eye glasses/contact lenses, denies diplopia, glaucoma or visual field defects. Ears, Nose, Throat, Mouth:  partial hearing loss, hearing aides Respiratory: denies dyspnea, cough, wheezing or hemoptysis. Cardiovascular:  please review HPI Abdominal: denies dyspepsia, GI bleeding, constipation, or diarrhea Genitourinary-Male: erectile dysfunction  Musculoskeletal:  denies arthritis, venous insufficiency, or muscle weakness. Neurological:  see HPI  ____________________________ PHYSICAL EXAMINATION VITAL SIGNS  Blood Pressure:  124/70 Sitting, Left arm, regular cuff  , 120/68 Standing, Left arm and regular cuff   Pulse:  100/min. Weight:  145.00 lbs. Height:  64"BMI: 25  Constitutional:  pleasant white male in no acute distress, looks stated age Skin:  no apparent skin lesions Head:  normocephalic, balding male hair pattern ENT:  hearing aide present right ear Neck:  supple, no masses, thyromegaly, JVD. Carotid pulses are full and equal bilaterally without bruits. Chest:  healed median sternotomy scar, clear to auscultation, normal A-P diameter Cardiac:  normal S1 and S2, no S3 or S4, no murmur, occasional premature beats Abdomen:  ventral hernia present Peripheral Pulses:  pulses full and equal in all extremities Extremities & Back:  normal muscle strength and tone., mild bilateral venous insufficiency changes present, no edema present, brace on left leg Neurological:  left arm weakness, left foot drop ____________________________ MOST RECENT LIPID PANEL 08/04/13  CHOL TOTL 128 mg/dl, LDL 88 NM, HDL 21 mg/dl, TRIGLYCER 94 mg/dl, CHOL/HDL 6.1 (Calc) and  ____________________________ IMPRESSIONS/PLAN  1. Recent hemorrhagic stroke 2. Previous mitral valve repair 3. History of transient atrial fibrillation with previous Maze procedure 4. Hypertensive heart  disease  Recommendations:  He is currently off of anticoagulation. Reviewing his EKGs shows him possibly to be in sinus rhythm with frequent PACs. This is what he has had before. My review the EKG from the hospital shows he has low amplitude P waves and appears to have sinus with bigeminal PACs. He is currently not a candidate for anticoagulation so I will see him back in 6 weeks and see what the neurologist has to say about it. It might be worthwhile to wear an event monitor at some point. ____________________________ TODAYS ORDERS  1. Return Visit: 6 weeks  2. 12 Lead EKG: Today                       ____________________________ Cardiology Physician:  Kerry Hough MD [facility]

## 2013-09-17 NOTE — Progress Notes (Signed)
Subjective:   Jack [name] is a 80 y.o. male presenting on 09/17/2013 with follow-up  Here for 1 wk f/u on orchitis/epididymitis.   Been cipro and is improved but not 100% back to normal.  Still has some persistent aching and swelling in scrotum.   The right testicle has always been quite larger than left due to injury from hernias surgery 20 years ago.  No other aggravating or relieving factors.  No other complaint.  Review of Systems ROS as in subjective      Objective:     Filed Vitals:   09/17/13 1157  BP: 104/60  Pulse: 60  Temp: 98.1 F (36.7 C)  Resp: 20   General appearance: alert, no distress, WD/WN, seated in chair, using cane, slowed pace coming in today. Abdomen:  soft, non tender, non distended, no masses GY:JEHU tenderness of right scrotum, mildly tender enlarged testicle, less swollen tender right spermatic cord, otherwise no skin changes, no hernia, no other mass or tenderness, significant improvement since last week.   Assessment: Encounter Diagnoses  Name Primary?  . Epididymitis Yes  . Orchitis      Plan: Will go additional week on cipro.  Advised ice pack with cloth in between, scrotal elevation, and call back in 1 week.  Recheck sooner if fever, worse pain or swelling.   [name] was seen today for follow-up.  Diagnoses and associated orders for this visit:  Epididymitis  Orchitis  Other Orders - ciprofloxacin (CIPRO) 500 MG tablet; Take 1 tablet (500 mg total) by mouth 2 (two) times daily.    Return in about 1 week (around 09/24/2013).

## 2013-09-17 NOTE — Patient Instructions (Signed)
Finish out Cipro you have.  Then begin 1 more week of Cipro antibiotic  Use ice pack for the swelling.   Use a cloth in between the scrotum and ice pack so you don't burn your skin  Use a pillow or towel to lift the scrotum to help with swelling  By the time you finish the Cipro, if you have swelling, pain, soreness, then call back.

## 2013-09-18 ENCOUNTER — Telehealth: Payer: Self-pay

## 2013-09-18 DIAGNOSIS — R531 Weakness: Secondary | ICD-10-CM

## 2013-09-18 NOTE — Telephone Encounter (Signed)
Okay to order outpatient PT and OT at neuro rehabilitation

## 2013-09-18 NOTE — Telephone Encounter (Signed)
Order for PT/OT placed.

## 2013-09-18 NOTE — Telephone Encounter (Signed)
Patients occupational therapist with [facility] called to release him to out patient therapy.  Please order out patient physical and occupational therapy at neuro rehab.

## 2013-09-24 ENCOUNTER — Ambulatory Visit: Payer: Medicare Other | Attending: Physical Medicine & Rehabilitation | Admitting: Physical Therapy

## 2013-09-24 ENCOUNTER — Ambulatory Visit: Payer: Medicare Other | Admitting: Physical Therapy

## 2013-09-24 DIAGNOSIS — R279 Unspecified lack of coordination: Secondary | ICD-10-CM | POA: Insufficient documentation

## 2013-09-24 DIAGNOSIS — R5381 Other malaise: Secondary | ICD-10-CM | POA: Insufficient documentation

## 2013-09-24 DIAGNOSIS — R269 Unspecified abnormalities of gait and mobility: Secondary | ICD-10-CM | POA: Insufficient documentation

## 2013-09-24 DIAGNOSIS — I69959 Hemiplegia and hemiparesis following unspecified cerebrovascular disease affecting unspecified side: Secondary | ICD-10-CM | POA: Insufficient documentation

## 2013-09-24 DIAGNOSIS — M6281 Muscle weakness (generalized): Secondary | ICD-10-CM | POA: Insufficient documentation

## 2013-09-25 ENCOUNTER — Ambulatory Visit: Payer: Medicare Other | Admitting: Occupational Therapy

## 2013-09-26 ENCOUNTER — Ambulatory Visit: Payer: Medicare Other | Admitting: Physical Therapy

## 2013-09-28 ENCOUNTER — Encounter: Payer: Self-pay | Admitting: Physical Medicine & Rehabilitation

## 2013-09-28 ENCOUNTER — Encounter (INDEPENDENT_AMBULATORY_CARE_PROVIDER_SITE_OTHER): Payer: Self-pay

## 2013-09-28 ENCOUNTER — Encounter: Payer: Medicare Other | Attending: Physical Medicine & Rehabilitation

## 2013-09-28 ENCOUNTER — Ambulatory Visit (HOSPITAL_BASED_OUTPATIENT_CLINIC_OR_DEPARTMENT_OTHER): Payer: Medicare Other | Admitting: Physical Medicine & Rehabilitation

## 2013-09-28 VITALS — BP 142/74 | HR 94 | Resp 14 | Ht 64.0 in | Wt 144.0 lb

## 2013-09-28 DIAGNOSIS — Z0289 Encounter for other administrative examinations: Secondary | ICD-10-CM

## 2013-09-28 DIAGNOSIS — Z8673 Personal history of transient ischemic attack (TIA), and cerebral infarction without residual deficits: Secondary | ICD-10-CM

## 2013-09-28 DIAGNOSIS — I10 Essential (primary) hypertension: Secondary | ICD-10-CM | POA: Insufficient documentation

## 2013-09-28 DIAGNOSIS — I69993 Ataxia following unspecified cerebrovascular disease: Secondary | ICD-10-CM | POA: Insufficient documentation

## 2013-09-28 DIAGNOSIS — I619 Nontraumatic intracerebral hemorrhage, unspecified: Secondary | ICD-10-CM

## 2013-09-28 DIAGNOSIS — I69959 Hemiplegia and hemiparesis following unspecified cerebrovascular disease affecting unspecified side: Secondary | ICD-10-CM | POA: Insufficient documentation

## 2013-09-28 DIAGNOSIS — E785 Hyperlipidemia, unspecified: Secondary | ICD-10-CM | POA: Insufficient documentation

## 2013-09-28 NOTE — Progress Notes (Signed)
Subjective:    Patient ID: Jack [name], male    DOB: 1934/07/12, 80 y.o.   MRN: [id number]  HPI Jack [name] is a 80 y.o. left-handed male with history of atrial fibrillation, hypertension, PFO, CVA 2009 not on anticoagulations and very little residual weakness. Admitted 08/02/2013 with left-sided weakness. Cranial CT scan showed 17 x 21 hemorrhagic infarcts posterior right basal ganglia and progression of microvascular disease. Follow cranial CT scan 08/04/2013 with stable hypertensive hemorrhage that's stable and mild vasogenic edema. Patient enrolled in ATACH-II study for BP control  Admit date: 08/07/2013  Discharge date: 08/31/2013 Started with [facility] PT/OT SLP Started on outpt therapy on 3/16  1 fall at home on carpet in using his AFO as well as quad cane. Not sure exactly what caused this period, no injury.  Saw cardiologist who did not restart any anticoagulants and recommended neurology followup Has appointment with neurology today Had appointment with primary care physician  Lasix potassium of 4.3 continues on supplementation.  Asking about whether protonix needs to be refilled Problem list reviewed no history of ulcer or GERD. No reflux type symptoms. Was not a chronic home medication. Pain Inventory Average Pain 0 Pain Right Now 0 My pain is no pain  In the last 24 hours, has pain interfered with the following? General activity 0 Relation with others 0 Enjoyment of life 0 What TIME of day is your pain at its worst? no pain Sleep (in general) Good  Pain is worse with: no pain Pain improves with: no pain Relief from Meds: no pain  Mobility use a cane how many minutes can you walk? 10 ability to climb steps?  yes do you drive?  no  Function retired I need assistance with the following:  meal prep, household duties and shopping  Neuro/Psych numbness trouble walking  Prior Studies Any changes since last visit?  no  Physicians involved in your care Any  changes since last visit?  no   History reviewed. No pertinent family history. History   Social History  . Marital Status: Married    Spouse Name: N/A    Number of Children: N/A  . Years of Education: N/A   Social History Main Topics  . Smoking status: Never Smoker   . Smokeless tobacco: Never Used  . Alcohol Use: No  . Drug Use: No  . Sexual Activity: None   Other Topics Concern  . None   Social History Narrative  . None   Past Surgical History  Procedure Laterality Date  . Mitral valve repair     Past Medical History  Diagnosis Date  . Hypertension   . ED (erectile dysfunction)   . CVA (cerebral infarction) 2009, 2015  . Atrial fibrillation   . Hyperlipidemia   . Patent foramen ovale   . Microscopic hematuria   . Elevated PSA   . Cellulitis of face 11/08    [facility]NT   BP 142/74  Pulse 94  Resp 14  Ht 5\' 4"  (1.626 m)  Wt 144 lb (65.318 kg)  BMI 24.71 kg/m2  SpO2 97%  Opioid Risk Score:   Fall Risk Score: High Fall Risk (>13 points) (educated and handout given for fall prevention in the home)  Review of Systems  Musculoskeletal: Positive for gait problem.  Neurological: Positive for numbness.  All other systems reviewed and are negative.       Objective:   Physical Exam  Neurological:  Reflex Scores:      Tricep  reflexes are 2+ on the right side and 3+ on the left side.      Bicep reflexes are 2+ on the right side and 3+ on the left side.      Brachioradialis reflexes are 2+ on the right side and 3+ on the left side.      Patellar reflexes are 2+ on the right side and 3+ on the left side.      Achilles reflexes are 2+ on the right side and 3+ on the left side.   Neurological: He is alert and oriented to person, place, and time.  Flat affect. Follows commands. Speech is mildly dysarthric but intelligible. LUE is grossly 4/5 moderate ataxia. LLE is better at 3+/5 HF, KE and 4- ADF/APF. improved LT LUE > LLE, proprioception impaired Left  great toe Limited insight and awareness.  Skin: Skin is warm and dry.  Ambulates with quad cane as well as left blue rocker AFO      Assessment & Plan:  1. Right basal ganglia hemorrhagic infarct with left hemiparesis and left sided ataxia as well as left hemi-sensory deficits which impede left foot advancement. Left ankle dorsiflexor strength has improved  Continue outpatient PT OT  Neuro followup as above Cardiology followup Primary care followup  Discontinue Protonix

## 2013-09-28 NOTE — Patient Instructions (Signed)
Discontinue pantoprazole  Continue potassium  Continue PT OT speech  No driving

## 2013-10-01 ENCOUNTER — Encounter: Payer: Medicare Other | Admitting: *Deleted

## 2013-10-02 ENCOUNTER — Ambulatory Visit: Payer: Medicare Other | Admitting: Physical Therapy

## 2013-10-02 ENCOUNTER — Ambulatory Visit: Payer: Medicare Other | Admitting: Occupational Therapy

## 2013-10-05 ENCOUNTER — Ambulatory Visit: Payer: Medicare Other | Admitting: *Deleted

## 2013-10-05 ENCOUNTER — Ambulatory Visit: Payer: Medicare Other | Admitting: Physical Therapy

## 2013-10-09 ENCOUNTER — Ambulatory Visit: Payer: Medicare Other | Admitting: Physical Therapy

## 2013-10-09 ENCOUNTER — Ambulatory Visit: Payer: Medicare Other | Admitting: *Deleted

## 2013-10-11 ENCOUNTER — Ambulatory Visit: Payer: Medicare Other | Admitting: Occupational Therapy

## 2013-10-11 ENCOUNTER — Ambulatory Visit: Payer: Medicare Other | Attending: Physical Medicine & Rehabilitation | Admitting: Physical Therapy

## 2013-10-11 DIAGNOSIS — R5381 Other malaise: Secondary | ICD-10-CM | POA: Insufficient documentation

## 2013-10-11 DIAGNOSIS — M6281 Muscle weakness (generalized): Secondary | ICD-10-CM | POA: Insufficient documentation

## 2013-10-11 DIAGNOSIS — R279 Unspecified lack of coordination: Secondary | ICD-10-CM | POA: Insufficient documentation

## 2013-10-11 DIAGNOSIS — I69959 Hemiplegia and hemiparesis following unspecified cerebrovascular disease affecting unspecified side: Secondary | ICD-10-CM | POA: Insufficient documentation

## 2013-10-11 DIAGNOSIS — R269 Unspecified abnormalities of gait and mobility: Secondary | ICD-10-CM | POA: Insufficient documentation

## 2013-10-17 ENCOUNTER — Ambulatory Visit: Payer: Medicare Other | Admitting: Occupational Therapy

## 2013-10-17 ENCOUNTER — Ambulatory Visit: Payer: Medicare Other | Admitting: Physical Therapy

## 2013-10-19 ENCOUNTER — Ambulatory Visit: Payer: Medicare Other

## 2013-10-19 ENCOUNTER — Ambulatory Visit: Payer: Medicare Other | Admitting: Physical Therapy

## 2013-10-22 ENCOUNTER — Ambulatory Visit: Payer: Medicare Other | Admitting: *Deleted

## 2013-10-22 ENCOUNTER — Ambulatory Visit: Payer: Medicare Other | Admitting: Physical Therapy

## 2013-10-24 ENCOUNTER — Ambulatory Visit: Payer: Medicare Other | Admitting: Physical Therapy

## 2013-10-24 ENCOUNTER — Ambulatory Visit: Payer: Medicare Other | Admitting: Occupational Therapy

## 2013-10-25 ENCOUNTER — Other Ambulatory Visit (HOSPITAL_COMMUNITY): Payer: Self-pay | Admitting: Physical Medicine and Rehabilitation

## 2013-10-30 ENCOUNTER — Ambulatory Visit: Payer: Medicare Other | Admitting: Occupational Therapy

## 2013-10-30 ENCOUNTER — Ambulatory Visit: Payer: Medicare Other | Admitting: Physical Therapy

## 2013-11-01 ENCOUNTER — Ambulatory Visit: Payer: Medicare Other | Admitting: *Deleted

## 2013-11-01 ENCOUNTER — Encounter (INDEPENDENT_AMBULATORY_CARE_PROVIDER_SITE_OTHER): Payer: Self-pay

## 2013-11-01 ENCOUNTER — Ambulatory Visit: Payer: Medicare Other | Admitting: Physical Therapy

## 2013-11-01 DIAGNOSIS — Z0289 Encounter for other administrative examinations: Secondary | ICD-10-CM

## 2013-11-02 ENCOUNTER — Ambulatory Visit (HOSPITAL_BASED_OUTPATIENT_CLINIC_OR_DEPARTMENT_OTHER): Payer: Medicare Other | Admitting: Physical Medicine & Rehabilitation

## 2013-11-02 ENCOUNTER — Encounter: Payer: Medicare Other | Attending: Physical Medicine & Rehabilitation

## 2013-11-02 ENCOUNTER — Encounter: Payer: Self-pay | Admitting: Physical Medicine & Rehabilitation

## 2013-11-02 ENCOUNTER — Other Ambulatory Visit: Payer: Self-pay | Admitting: Family Medicine

## 2013-11-02 ENCOUNTER — Telehealth: Payer: Self-pay | Admitting: Medical

## 2013-11-02 VITALS — BP 138/71 | HR 91 | Resp 16 | Ht 63.0 in | Wt 145.0 lb

## 2013-11-02 DIAGNOSIS — I69993 Ataxia following unspecified cerebrovascular disease: Secondary | ICD-10-CM | POA: Insufficient documentation

## 2013-11-02 DIAGNOSIS — G8110 Spastic hemiplegia affecting unspecified side: Secondary | ICD-10-CM

## 2013-11-02 DIAGNOSIS — I619 Nontraumatic intracerebral hemorrhage, unspecified: Secondary | ICD-10-CM

## 2013-11-02 DIAGNOSIS — I10 Essential (primary) hypertension: Secondary | ICD-10-CM | POA: Insufficient documentation

## 2013-11-02 DIAGNOSIS — I69959 Hemiplegia and hemiparesis following unspecified cerebrovascular disease affecting unspecified side: Secondary | ICD-10-CM | POA: Insufficient documentation

## 2013-11-02 DIAGNOSIS — E785 Hyperlipidemia, unspecified: Secondary | ICD-10-CM | POA: Insufficient documentation

## 2013-11-02 MED ORDER — POTASSIUM CHLORIDE CRYS ER 10 MEQ PO TBCR: 10.0000 meq | EXTENDED_RELEASE_TABLET | Freq: Every day | ORAL | Status: DC

## 2013-11-02 NOTE — Patient Instructions (Signed)
Please have physical therapy try gait training without the left AFO. Ankle dorsiflexor inverter and everterr strength have improved

## 2013-11-02 NOTE — Telephone Encounter (Signed)
RX REFILL WAS SENT. CLS

## 2013-11-02 NOTE — Progress Notes (Signed)
Subjective:    Patient ID: Jack [name], male    DOB: October 06, 1933, 80 y.o.   MRN: [id number]  HPI NAQUAN GARMAN is a 80 y.o. left-handed male with history of atrial fibrillation, hypertension, PFO, CVA 2009 not on anticoagulations and very little residual weakness. Admitted 08/02/2013 with left-sided weakness. Cranial CT scan showed 17 x 21 hemorrhagic infarcts posterior right basal ganglia and progression of microvascular disease. Follow cranial CT scan 08/04/2013 with stable hypertensive hemorrhage that's stable and mild vasogenic edema. Patient enrolled in ATACH-II study for BP control  Admit date: 08/07/2013  Discharge date: 08/31/2013 Started with [facility] PT/OT SLP Started on outpt therapy on 3/16  1 fall at home on carpet in using his AFO as well as quad cane. Not sure exactly what caused this period, no injury.  Complaining of frequent urination at night up to 7 times per night did not go this often prior to stroke  Hand numbness on the left side foot numbness on the left side not painful does not interfere with sleep Questioning whether he can come out of his AFO  Pain Inventory Average Pain 1 Pain Right Now 1 My pain is tingling  In the last 24 hours, has pain interfered with the following? General activity 0 Relation with others 0 Enjoyment of life 0 What TIME of day is your pain at its worst? night Sleep (in general) Poor  Pain is worse with: unsure Pain improves with: na Relief from Meds: na  Mobility use a cane ability to climb steps?  yes transfers alone  Function disabled: date disabled 08-02-13 I need assistance with the following:  meal prep and shopping  Neuro/Psych No problems in this area  Prior Studies Any changes since last visit?  no  Physicians involved in your care Any changes since last visit?  no   History reviewed. No pertinent family history. History   Social History  . Marital Status: Married    Spouse Name: N/A    Number of Children:  N/A  . Years of Education: N/A   Social History Main Topics  . Smoking status: Never Smoker   . Smokeless tobacco: Never Used  . Alcohol Use: No  . Drug Use: No  . Sexual Activity: None   Other Topics Concern  . None   Social History Narrative  . None   Past Surgical History  Procedure Laterality Date  . Mitral valve repair     Past Medical History  Diagnosis Date  . Hypertension   . ED (erectile dysfunction)   . CVA (cerebral infarction) 2009, 2015  . Atrial fibrillation   . Hyperlipidemia   . Patent foramen ovale   . Microscopic hematuria   . Elevated PSA   . Cellulitis of face 11/08    [facility]   BP 138/71  Pulse 91  Resp 16  Ht 5\' 3"  (1.6 m)  Wt 145 lb (65.772 kg)  BMI 25.69 kg/m2  SpO2 97%  Opioid Risk Score:   Fall Risk Score:     Review of Systems  Cardiovascular: Positive for leg swelling.  Musculoskeletal: Positive for gait problem and joint swelling.  All other systems reviewed and are negative.      Objective:   Physical Exam  Left foot is 4 minus ankle dorsiflexion eversion and inversion. Left upper extremity strength 4 minus at the deltoid bicep triceps grip Left lower extremity 4 at the hip flexor knee extended Right-sided 5/5 in all muscle groups. Sensation intact  to light touch in the toes. Gait can be cued to raise the left toes without the AFO but this does result in a longer step length     Assessment & Plan:  1. Hemorrhagic infarct right basal ganglia with left spastic any parents is. Overall making good progress. Will ask physical therapy to train ambulation without AFO  Return to clinic in 6 weeks

## 2013-11-06 ENCOUNTER — Ambulatory Visit: Payer: Medicare Other | Admitting: Physical Therapy

## 2013-11-06 ENCOUNTER — Ambulatory Visit: Payer: Medicare Other | Admitting: Occupational Therapy

## 2013-11-08 ENCOUNTER — Ambulatory Visit: Payer: Medicare Other | Admitting: Occupational Therapy

## 2013-11-08 ENCOUNTER — Ambulatory Visit: Payer: Medicare Other | Admitting: Physical Therapy

## 2013-11-13 ENCOUNTER — Ambulatory Visit: Payer: Medicare Other | Admitting: Occupational Therapy

## 2013-11-13 ENCOUNTER — Ambulatory Visit: Payer: Medicare Other | Attending: Physical Medicine & Rehabilitation | Admitting: Physical Therapy

## 2013-11-13 DIAGNOSIS — R279 Unspecified lack of coordination: Secondary | ICD-10-CM | POA: Insufficient documentation

## 2013-11-13 DIAGNOSIS — R5381 Other malaise: Secondary | ICD-10-CM | POA: Insufficient documentation

## 2013-11-13 DIAGNOSIS — R269 Unspecified abnormalities of gait and mobility: Secondary | ICD-10-CM | POA: Insufficient documentation

## 2013-11-13 DIAGNOSIS — I69959 Hemiplegia and hemiparesis following unspecified cerebrovascular disease affecting unspecified side: Secondary | ICD-10-CM | POA: Insufficient documentation

## 2013-11-13 DIAGNOSIS — M6281 Muscle weakness (generalized): Secondary | ICD-10-CM | POA: Insufficient documentation

## 2013-11-16 ENCOUNTER — Ambulatory Visit: Payer: Medicare Other | Admitting: Physical Therapy

## 2013-11-16 ENCOUNTER — Ambulatory Visit: Payer: Medicare Other | Admitting: *Deleted

## 2013-11-20 ENCOUNTER — Ambulatory Visit: Payer: Medicare Other | Admitting: Occupational Therapy

## 2013-11-20 ENCOUNTER — Ambulatory Visit: Payer: Medicare Other | Admitting: Physical Therapy

## 2013-11-22 ENCOUNTER — Ambulatory Visit: Payer: Medicare Other | Admitting: Occupational Therapy

## 2013-11-22 ENCOUNTER — Ambulatory Visit: Payer: Medicare Other | Admitting: Physical Therapy

## 2013-11-27 ENCOUNTER — Ambulatory Visit: Payer: Medicare Other | Admitting: Rehabilitative and Restorative Service Providers"

## 2013-11-27 ENCOUNTER — Ambulatory Visit: Payer: Medicare Other | Admitting: Physical Therapy

## 2013-11-27 ENCOUNTER — Ambulatory Visit: Payer: Medicare Other | Admitting: Occupational Therapy

## 2013-11-29 ENCOUNTER — Ambulatory Visit: Payer: Medicare Other | Admitting: Occupational Therapy

## 2013-11-29 ENCOUNTER — Ambulatory Visit: Payer: Medicare Other | Admitting: Physical Therapy

## 2013-12-04 ENCOUNTER — Ambulatory Visit: Payer: Medicare Other | Admitting: Occupational Therapy

## 2013-12-04 ENCOUNTER — Ambulatory Visit: Payer: Medicare Other | Admitting: Physical Therapy

## 2013-12-06 ENCOUNTER — Ambulatory Visit: Payer: Medicare Other | Admitting: Physical Therapy

## 2013-12-06 ENCOUNTER — Ambulatory Visit: Payer: Medicare Other | Admitting: Occupational Therapy

## 2013-12-11 ENCOUNTER — Ambulatory Visit: Payer: Medicare Other | Admitting: Occupational Therapy

## 2013-12-11 ENCOUNTER — Ambulatory Visit: Payer: Medicare Other | Attending: Physical Medicine & Rehabilitation | Admitting: Physical Therapy

## 2013-12-11 DIAGNOSIS — I69959 Hemiplegia and hemiparesis following unspecified cerebrovascular disease affecting unspecified side: Secondary | ICD-10-CM | POA: Insufficient documentation

## 2013-12-11 DIAGNOSIS — M6281 Muscle weakness (generalized): Secondary | ICD-10-CM | POA: Insufficient documentation

## 2013-12-11 DIAGNOSIS — R279 Unspecified lack of coordination: Secondary | ICD-10-CM | POA: Insufficient documentation

## 2013-12-11 DIAGNOSIS — R5381 Other malaise: Secondary | ICD-10-CM | POA: Insufficient documentation

## 2013-12-11 DIAGNOSIS — R269 Unspecified abnormalities of gait and mobility: Secondary | ICD-10-CM | POA: Insufficient documentation

## 2013-12-13 ENCOUNTER — Ambulatory Visit: Payer: Medicare Other | Admitting: Physical Therapy

## 2013-12-13 ENCOUNTER — Ambulatory Visit: Payer: Medicare Other | Admitting: Occupational Therapy

## 2013-12-14 ENCOUNTER — Encounter: Payer: Self-pay | Admitting: Physical Medicine & Rehabilitation

## 2013-12-14 ENCOUNTER — Encounter: Payer: Medicare Other | Attending: Physical Medicine & Rehabilitation

## 2013-12-14 ENCOUNTER — Ambulatory Visit (HOSPITAL_BASED_OUTPATIENT_CLINIC_OR_DEPARTMENT_OTHER): Payer: Medicare Other | Admitting: Physical Medicine & Rehabilitation

## 2013-12-14 VITALS — BP 124/64 | HR 80 | Resp 14 | Wt 145.6 lb

## 2013-12-14 DIAGNOSIS — I619 Nontraumatic intracerebral hemorrhage, unspecified: Secondary | ICD-10-CM

## 2013-12-14 DIAGNOSIS — E785 Hyperlipidemia, unspecified: Secondary | ICD-10-CM | POA: Insufficient documentation

## 2013-12-14 DIAGNOSIS — I69959 Hemiplegia and hemiparesis following unspecified cerebrovascular disease affecting unspecified side: Secondary | ICD-10-CM | POA: Insufficient documentation

## 2013-12-14 DIAGNOSIS — I69993 Ataxia following unspecified cerebrovascular disease: Secondary | ICD-10-CM | POA: Insufficient documentation

## 2013-12-14 DIAGNOSIS — I10 Essential (primary) hypertension: Secondary | ICD-10-CM | POA: Insufficient documentation

## 2013-12-14 DIAGNOSIS — G8110 Spastic hemiplegia affecting unspecified side: Secondary | ICD-10-CM

## 2013-12-14 NOTE — Progress Notes (Signed)
Subjective:    Patient ID: Jack [name], male    DOB: [birth year], 80 y.o.   MRN: [id number]  HPI Jack [name] is a 80 y.o. left-handed male with history of atrial fibrillation, hypertension, PFO, CVA 2009 not on anticoagulations and very little residual weakness. Admitted 08/02/2013 with left-sided weakness. Cranial CT scan showed 17 x 21 hemorrhagic infarcts posterior right basal ganglia and progression of microvascular disease. Follow cranial CT scan 08/04/2013 with stable hypertensive hemorrhage that's stable and mild vasogenic edema. Patient enrolled in ATACH-II study for BP control  Admit date: 08/07/2013  Discharge date: 08/31/2013   Has 2 more weeks of outpatient therapy No longer using left AFO.  Left arm and left leg still known and tingling. Overall patient feels like he is improving. No other M.D. followups. Pain Inventory Average Pain 4 Pain Right Now 2 My pain is tingling  In the last 24 hours, has pain interfered with the following? General activity 1 Relation with others 0 Enjoyment of life 0 What TIME of day is your pain at its worst? evening Sleep (in general) Good  Pain is worse with: bending Pain improves with: rest Relief from Meds: 0  Mobility use a cane how many minutes can you walk? 20 ability to climb steps?  yes do you drive?  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  no  Physicians involved in your care Any changes since last visit?  no   History reviewed. No pertinent family history. History   Social History  . Marital Status: Married    Spouse Name: N/A    Number of Children: N/A  . Years of Education: N/A   Social History Main Topics  . Smoking status: Never Smoker   . Smokeless tobacco: Never Used  . Alcohol  Use: No  . Drug Use: No  . Sexual Activity: None   Other Topics Concern  . None   Social History Narrative  . None   Past Surgical History  Procedure Laterality Date  . Mitral valve repair     Past Medical History  Diagnosis Date  . Hypertension   . ED (erectile dysfunction)   . CVA (cerebral infarction) 2009, 2015  . Atrial fibrillation   . Hyperlipidemia   . Patent foramen ovale   . Microscopic hematuria   . Elevated PSA   . Cellulitis of face 11/08    [facility]   BP 124/64  Pulse 80  Resp 14  Wt 145 lb 9.6 oz (66.044 kg)  SpO2 100%  Opioid Risk Score:   Fall Risk Score: Moderate Fall Risk (6-13 points) (educated and given handout for fall prevention in the home at previous visit) Review of Systems  Musculoskeletal: Positive for gait problem.  Neurological: Positive for numbness.       Tingling  All other systems reviewed and are negative.      Objective:   Physical Exam  Left foot is 4 minus ankle dorsiflexion eversion and inversion.  Left upper extremity strength 4 minus at the deltoid bicep triceps grip  Left lower extremity 4 at the hip flexor knee extended  Right-sided 5/5 in all muscle groups.  Sensation intact to light touch in the toes.  Gait no toe drag her knee instability wide base of support. Uses  a cane to ambulate but is able to carry this through the hallway.  Finger to thumb opposition left hand is reduced Normal finger to thumb opposition on the right hand  Sensation intact to light touch in the left thumb and left little finger but reduced in the middle fingers       Assessment & Plan:  1. Hemorrhagic infarct right basal ganglia with left spastic hemi making good progress in therapy. He should be able to progress to the point where he no longer needs a straight cane to ambulate. Discussed with patient and wife  Return to clinic 6 weeks

## 2013-12-14 NOTE — Patient Instructions (Signed)
Cont outpt therapist

## 2013-12-18 ENCOUNTER — Ambulatory Visit: Payer: Medicare Other | Admitting: Occupational Therapy

## 2013-12-18 ENCOUNTER — Ambulatory Visit: Payer: Medicare Other | Admitting: Physical Therapy

## 2013-12-20 ENCOUNTER — Ambulatory Visit: Payer: Medicare Other | Admitting: Physical Therapy

## 2013-12-20 ENCOUNTER — Ambulatory Visit: Payer: Medicare Other | Admitting: Occupational Therapy

## 2013-12-25 ENCOUNTER — Ambulatory Visit: Payer: Medicare Other | Admitting: Occupational Therapy

## 2013-12-25 ENCOUNTER — Ambulatory Visit: Payer: Medicare Other | Admitting: Physical Therapy

## 2013-12-26 ENCOUNTER — Ambulatory Visit: Payer: Medicare Other | Admitting: Physical Therapy

## 2013-12-27 ENCOUNTER — Ambulatory Visit: Payer: Medicare Other | Admitting: Physical Therapy

## 2013-12-27 ENCOUNTER — Encounter: Payer: Medicare Other | Admitting: Occupational Therapy

## 2014-01-01 ENCOUNTER — Ambulatory Visit: Payer: Medicare Other | Admitting: Occupational Therapy

## 2014-01-01 ENCOUNTER — Ambulatory Visit: Payer: Medicare Other | Admitting: Physical Therapy

## 2014-01-03 ENCOUNTER — Ambulatory Visit: Payer: Medicare Other | Admitting: Physical Therapy

## 2014-01-03 ENCOUNTER — Ambulatory Visit: Payer: Medicare Other | Admitting: Occupational Therapy

## 2014-01-07 ENCOUNTER — Ambulatory Visit: Payer: Medicare Other | Admitting: Rehabilitative and Restorative Service Providers"

## 2014-01-07 ENCOUNTER — Ambulatory Visit: Payer: Medicare Other | Admitting: Occupational Therapy

## 2014-01-10 ENCOUNTER — Ambulatory Visit: Payer: Medicare Other | Admitting: Occupational Therapy

## 2014-01-10 ENCOUNTER — Ambulatory Visit: Payer: Medicare Other | Attending: Physical Medicine & Rehabilitation | Admitting: Physical Therapy

## 2014-01-10 DIAGNOSIS — R279 Unspecified lack of coordination: Secondary | ICD-10-CM | POA: Insufficient documentation

## 2014-01-10 DIAGNOSIS — R5381 Other malaise: Secondary | ICD-10-CM | POA: Diagnosis not present

## 2014-01-10 DIAGNOSIS — R269 Unspecified abnormalities of gait and mobility: Secondary | ICD-10-CM | POA: Diagnosis not present

## 2014-01-10 DIAGNOSIS — I69959 Hemiplegia and hemiparesis following unspecified cerebrovascular disease affecting unspecified side: Secondary | ICD-10-CM | POA: Insufficient documentation

## 2014-01-10 DIAGNOSIS — Z5189 Encounter for other specified aftercare: Secondary | ICD-10-CM | POA: Diagnosis present

## 2014-01-10 DIAGNOSIS — M6281 Muscle weakness (generalized): Secondary | ICD-10-CM | POA: Insufficient documentation

## 2014-01-14 ENCOUNTER — Ambulatory Visit: Payer: Medicare Other | Admitting: Rehabilitative and Restorative Service Providers"

## 2014-01-14 ENCOUNTER — Ambulatory Visit: Payer: Medicare Other | Admitting: Occupational Therapy

## 2014-01-14 DIAGNOSIS — R269 Unspecified abnormalities of gait and mobility: Secondary | ICD-10-CM | POA: Diagnosis not present

## 2014-01-16 ENCOUNTER — Ambulatory Visit: Payer: Medicare Other | Admitting: Rehabilitative and Restorative Service Providers"

## 2014-01-16 ENCOUNTER — Ambulatory Visit: Payer: Medicare Other | Admitting: Occupational Therapy

## 2014-01-16 DIAGNOSIS — R269 Unspecified abnormalities of gait and mobility: Secondary | ICD-10-CM | POA: Diagnosis not present

## 2014-01-21 ENCOUNTER — Encounter: Payer: Self-pay | Admitting: Cardiology

## 2014-01-21 NOTE — Progress Notes (Signed)
Patient ID: Jack [name], male   DOB: 1934-06-18, 80 y.o.   MRN: [id number]   Jack, [name]  Date of visit:  01/21/2014 DOB:  21-May-1934    Age:  80 yrs. Medical record number:  [id number]     Account number:  [id number] Primary Care Provider: Jill Alexanders C ____________________________ CURRENT DIAGNOSES  1. Surgery-S/P Mitral valve Repair  2. Hyperlipidemia  3. Arrhythmia-Atrial Fibrillation  4. Other sequelae of cerebral infarction  5. Unspecified Late Effects Of Cerebrovascular Disease  6. Hypertension-Essential (Benign)  7. Mitral Valve Disorder ____________________________ ALLERGIES  No Known Drug Allergies ____________________________ MEDICATIONS  1. Viagra 100 mg tablet, Take as directed  2. amoxicillin 500 mg capsule, take 4 1 hour befor dental work or surgery  3. valsartan 320 mg-hydrochlorothiazide 12.5 mg tablet, 1 p.o. daily  4. aspirin 81 mg chewable tablet, 1 p.o. daily  5. amlodipine 5 mg tablet, 1 p.o. daily ____________________________ CHIEF COMPLAINTS  Followup of Arrhythmia-Atrial Fibrillation  Followup of Other sequelae of cerebral infarction  Followup of Surgery-S/P Mitral valve Repair ____________________________ HISTORY OF PRESENT ILLNESS  Patient seen for cardiac followup. He has completed rehabilitation now and appears to have made a reasonable recovery from his stroke. He is not currently having any angina but has a mild amount of edema at the end of the day. He has had a recent upper respiratory infection that he is getting over. He denies PND, orthopnea, syncope, or claudication. He continues to have to walk with a cane and is moderately limited with this. He has a nonhemorrhagic CVA in the past. 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 Roxy Manns;  Cardiology Procedures-Noninvasive:  echocardiogram July 2007, TEE June 2009, cardiac CT June 2009, event monitor June 2009, echocardiogram August 2011, echocardiogram October 2013, event monitor November 2013;  Cardiac Cath Results:  no significant disease, severe mitral regurgitation, pulmonary hypertension;  LVEF of 70% documented via echocardiogram on 02/11/2010,  CHADS Score:  4,  CHA2DS2-VASC Score:  5 ____________________________ CARDIO-PULMONARY TEST DATES EKG Date:  09/17/2013;   Cardiac Cath Date:  02/09/2006;  CABG: 02/11/2006;  Holter/Event Monitor Date: 05/12/2012;  Echocardiography Date: 05/10/2012;  CT Scan Date:  12/25/2007   ____________________________ FAMILY HISTORY Brother -- Brother alive and well Brother -- Brother dead, Bowel cancer Brother -- Brother alive and well Father -- Father dead, Coronary Artery Disease Mother -- Mother dead, Death of unknown cause Sister -- Sister dead, Blood disorder ____________________________ SOCIAL HISTORY Alcohol Use:  no alcohol use;  Smoking:  never smoked;  Diet:  low fat Diet;  Lifestyle:  married;  Exercise:  walking;  Occupation:  retired and [employer];  Residence:  lives with wife;   ____________________________ REVIEW OF SYSTEMS General:  feels well, no change in exercise tolerance. Eyes: wears eye glasses/contact lenses, denies diplopia, glaucoma or visual field defects. Ears, Nose, Throat, Mouth:  partial hearing loss, hearing aides Respiratory: denies dyspnea, cough, wheezing or hemoptysis. Cardiovascular:  please review HPI Abdominal: denies dyspepsia, GI bleeding, constipation, or diarrhea Genitourinary-Male: erectile dysfunction Neurological:  see HPI   ____________________________ PHYSICAL EXAMINATION VITAL SIGNS  Blood Pressure:  124/70 Sitting, Left  arm, regular cuff  , 120/76 Standing, Left arm and regular cuff   Pulse:  82/min. Weight:  145.00 lbs. Height:  64"BMI: 25  Constitutional:  pleasant white male in no acute distress walks with cane Skin:  no apparent skin lesions Head:  normocephalic, balding male hair pattern ENT:  hearing aide present right ear Neck:  supple, no masses, thyromegaly, JVD. Carotid pulses are full and equal bilaterally without bruits. Chest:  healed median sternotomy scar, clear to auscultation, normal A-P diameter Cardiac:  normal S1 and S2, no S3 or S4, no murmur, occasional premature beats Abdomen:  ventral hernia present Peripheral Pulses:  pulses full and equal in all extremities Extremities & Back:  normal muscle strength and tone., mild bilateral venous insufficiency changes present, no edema present, brace on left leg Neurological:  left arm weakness, left foot drop ____________________________ MOST RECENT LIPID PANEL 08/04/13  CHOL TOTL 128 mg/dl, LDL 88 NM, HDL 21 mg/dl, TRIGLYCER 94 mg/dl, ALT 13 u/l, ALK PHOS 42 u/l, CHOL/HDL 6.1 (Calc) and AST 20 u/l ____________________________ IMPRESSIONS/PLAN  1. Previous mitral valve repair 2. Prior hemorrhagic thalamic stroke 3. Residual deficits following stroke 4. Hypertension 5. Dependent edema that may be due to amlodipine  Recommendations:  Clinically the patient appears to have made a recent recovery from the stroke although he continues to have deficits. I will plan to see him in followup in 6 months. Call if recurrent problems. ____________________________ TODAYS ORDERS  1. Return Visit: 6 months  2. 12 Lead EKG: 6 months                       ____________________________ Cardiology Physician:  W. Spencer , Jr. MD FACC    

## 2014-01-25 ENCOUNTER — Ambulatory Visit: Payer: Medicare Other | Admitting: Physical Medicine & Rehabilitation

## 2014-02-04 ENCOUNTER — Ambulatory Visit: Payer: Medicare Other | Admitting: Physical Medicine & Rehabilitation

## 2014-03-01 ENCOUNTER — Encounter: Payer: Self-pay | Admitting: Physical Medicine & Rehabilitation

## 2014-03-01 ENCOUNTER — Ambulatory Visit (HOSPITAL_BASED_OUTPATIENT_CLINIC_OR_DEPARTMENT_OTHER): Payer: Medicare Other | Admitting: Physical Medicine & Rehabilitation

## 2014-03-01 ENCOUNTER — Encounter: Payer: Medicare Other | Attending: Physical Medicine & Rehabilitation

## 2014-03-01 VITALS — BP 130/47 | HR 74 | Resp 14 | Ht 64.0 in | Wt 148.0 lb

## 2014-03-01 DIAGNOSIS — I610 Nontraumatic intracerebral hemorrhage in hemisphere, subcortical: Secondary | ICD-10-CM

## 2014-03-01 DIAGNOSIS — E785 Hyperlipidemia, unspecified: Secondary | ICD-10-CM | POA: Insufficient documentation

## 2014-03-01 DIAGNOSIS — I619 Nontraumatic intracerebral hemorrhage, unspecified: Secondary | ICD-10-CM

## 2014-03-01 DIAGNOSIS — I69993 Ataxia following unspecified cerebrovascular disease: Secondary | ICD-10-CM | POA: Insufficient documentation

## 2014-03-01 DIAGNOSIS — I10 Essential (primary) hypertension: Secondary | ICD-10-CM | POA: Diagnosis not present

## 2014-03-01 DIAGNOSIS — I69959 Hemiplegia and hemiparesis following unspecified cerebrovascular disease affecting unspecified side: Secondary | ICD-10-CM | POA: Insufficient documentation

## 2014-03-01 DIAGNOSIS — G8110 Spastic hemiplegia affecting unspecified side: Secondary | ICD-10-CM

## 2014-03-01 NOTE — Progress Notes (Signed)
Subjective:    Patient ID: Jack [name], male    DOB: 1934-04-10, 80 y.o.   MRN: [id number] Jack [name] is a 80 y.o. left-handed male with history of atrial fibrillation, hypertension, PFO, CVA 2009 not on anticoagulations and very little residual weakness. Admitted 08/02/2013 with left-sided weakness. Cranial CT scan showed 17 x 21 hemorrhagic infarcts posterior right basal ganglia and progression of microvascular disease. Follow cranial CT scan 08/04/2013 with stable hypertensive hemorrhage that's stable and mild vasogenic edema. Patient enrolled in ATACH-II study for BP control   Admit date: 08/07/2013   Discharge date: 08/31/2013    HPI  Finished outpt therapy Now doing community based exercise program with Trainer at [facility] No falls   Pain Inventory Average Pain 0 Pain Right Now 0 My pain is no pain  In the last 24 hours, has pain interfered with the following? General activity 0 Relation with others 0 Enjoyment of life 0 What TIME of day is your pain at its worst? no pain Sleep (in general) Fair  Pain is worse with: na Pain improves with: na Relief from Meds: no pain meds  Mobility walk with assistance use a cane how many minutes can you walk? 20 ability to climb steps?  yes do you drive?  no  Function retired  Neuro/Psych No problems in this area  Prior Studies Any changes since last visit?  no  Physicians involved in your care Any changes since last visit?  no   History reviewed. No pertinent family history. History   Social History  . Marital Status: Married    Spouse Name: N/A    Number of Children: N/A  . Years of Education: N/A   Social History Main Topics  . Smoking status: Never Smoker   . Smokeless tobacco: Never Used  . Alcohol Use: No  . Drug Use: No  . Sexual Activity: None   Other Topics Concern  . None   Social History Narrative  . None   Past Surgical History  Procedure Laterality Date  . Mitral valve repair     Past  Medical History  Diagnosis Date  . Hypertension   . ED (erectile dysfunction)   . CVA (cerebral infarction) 2009, 2015  . Atrial fibrillation   . Hyperlipidemia   . Patent foramen ovale   . Microscopic hematuria   . Elevated PSA   . Cellulitis of face 11/08    [facility]NT   BP 130/47  Pulse 74  Resp 14  Ht 5\' 4"  (1.626 m)  Wt 148 lb (67.132 kg)  BMI 25.39 kg/m2  SpO2 96%  Opioid Risk Score:   Fall Risk Score: Moderate Fall Risk (6-13 points) (t educated, handout declined)    Review of Systems  Respiratory: Positive for wheezing.   Cardiovascular: Positive for leg swelling.  All other systems reviewed and are negative.      Objective:   Physical Exam Decreased sensation Left hand light touch vs right hand  Left foot is 4 minus ankle dorsiflexion eversion and inversion.  Left upper extremity strength 4 minus at the deltoid, 4+ bicep triceps grip  Left lower extremity 5 at the hip flexor knee extended  Right-sided 5/5 in all muscle groups.  Sensation intact to light touch in the toes.  Gait no toe drag her knee instability wide base of support. Uses a cane to ambulate but is able to carry this through the hallway.  Finger to thumb opposition left hand is reduced  Normal  finger to thumb opposition on the right hand      Assessment & Plan:  1. Hemorrhagic infarct right basal ganglia with left spastic hemi making good progress in therapy. No longer uses cane in home just outside home Discussed driving  with patient and wife  Driver services eval recommended

## 2014-03-01 NOTE — Patient Instructions (Signed)
Once driver evaluation report is obtained. We will call and inform you of driving abilities and any restrictions

## 2014-07-12 DIAGNOSIS — Z792 Long term (current) use of antibiotics: Secondary | ICD-10-CM

## 2014-07-12 HISTORY — DX: Long term (current) use of antibiotics: Z79.2

## 2015-01-08 ENCOUNTER — Ambulatory Visit (INDEPENDENT_AMBULATORY_CARE_PROVIDER_SITE_OTHER): Payer: Medicare Other | Admitting: Family Medicine

## 2015-01-08 VITALS — BP 124/80 | HR 93 | Wt 148.0 lb

## 2015-01-08 DIAGNOSIS — W57XXXA Bitten or stung by nonvenomous insect and other nonvenomous arthropods, initial encounter: Secondary | ICD-10-CM | POA: Diagnosis not present

## 2015-01-08 DIAGNOSIS — S2096XA Insect bite (nonvenomous) of unspecified parts of thorax, initial encounter: Secondary | ICD-10-CM

## 2015-01-08 NOTE — Progress Notes (Signed)
   Subjective:    Patient ID: Jack [name], male    DOB: October 22, 1933, 81 y.o.   MRN: [id number]  HPI He noted a tick in the left upper rib/axillary area and attempted to remove it. He has concerns over whether it is still there.   Review of Systems     Objective:   Physical Exam A healing slightly erythematous lesion is noted in the left upper chest/axillary area. No evidence of the tick was present.       Assessment & Plan:  Tick bite of rib, initial encounter Explained that there is no risk if part of the tick is still embedded that the body would take care of it on its own. Discussed RMSF and also local infection. They will call if the slight erythema does get worse.

## 2015-02-24 ENCOUNTER — Ambulatory Visit (INDEPENDENT_AMBULATORY_CARE_PROVIDER_SITE_OTHER): Payer: Medicare Other | Admitting: Medical

## 2015-02-24 ENCOUNTER — Other Ambulatory Visit: Payer: Self-pay

## 2015-02-24 ENCOUNTER — Telehealth: Payer: Self-pay | Admitting: Medical

## 2015-02-24 ENCOUNTER — Encounter: Payer: Self-pay | Admitting: Medical

## 2015-02-24 VITALS — BP 122/76 | HR 92 | Temp 98.1°F | Wt 148.0 lb

## 2015-02-24 DIAGNOSIS — I10 Essential (primary) hypertension: Secondary | ICD-10-CM

## 2015-02-24 DIAGNOSIS — T783XXA Angioneurotic edema, initial encounter: Secondary | ICD-10-CM | POA: Diagnosis not present

## 2015-02-24 NOTE — Telephone Encounter (Signed)
Pt states med needs to [facility]

## 2015-02-24 NOTE — Telephone Encounter (Signed)
They were suppose to call back to inform where they want me to send the medications.  Please find out.

## 2015-02-24 NOTE — Patient Instructions (Signed)
Recommendations:  STOP Diovan HCT  Double up and take Amlodipine 5mg , 2 tablets daily  Begin Hydrochlorothiazide 12.5mg  tablets daily in the morning  Take Benadryl OTC, 25mg , 1/2-1 tablet 2-3 times daily the next day or two.   If not back to normal within the latter part of this week call back  If worse - such as tongue swelling, difficulty breathing, then go to the Emergency Dept.  If doing fine on the above recommendations, then plan a recheck in 3 weeks.

## 2015-02-24 NOTE — Progress Notes (Signed)
Subjective: Here accompanied by wife for lip swelling . Started this morning.   Denies any problems with this last night.  No recent teeth pain, fever, face trauma. He notes eating some spicy food last night, but denies any unusual foods, no mouth itching.  The only symptoms is upper lip swelling.  No SOB, dyspnea, chest pain.  No recent seafood.  He is on Diovan HCT, and has been on this for years.   He did recently start magnesium per another doctor.  No other aggravating or relieving factors. No other complaint.  Past Medical History  Diagnosis Date  . Hypertension   . ED (erectile dysfunction)   . CVA (cerebral infarction) 2009, 2015  . Atrial fibrillation   . Hyperlipidemia   . Patent foramen ovale   . Microscopic hematuria   . Elevated PSA   . Cellulitis of face 11/08    [facility] ENT   ROS as in subjective   Objective: BP 122/76 mmHg  Pulse 92  Temp(Src) 98.1 F (36.7 C) (Oral)  Wt 148 lb (67.132 kg)  SpO2 96%  General appearance: alert, no distress, WD/WN Skin: no rash HEENT: upper lip with mild generalized swelling suggestive of angioedema, otherwise normocephalic, sclerae anicteric, TMs pearly, nares patent, no discharge or erythema, pharynx normal Oral cavity: MMM, no lesions Neck: supple, no lymphadenopathy, no thyromegaly, no masses Lungs: CTA bilaterally, no wheezes, rhonchi, or rales Pulses: 2+ symmetric, upper and lower extremities, normal cap refill No edema   Assessment: Encounter Diagnoses  Name Primary?  . Angioedema, initial encounter Yes  . Essential hypertension, benign     Plan: Discuss findings, reviewed case with Dr. Redmond School supervising physician as well.  Suspect Diovan although ARB angioedema cases are less common.   The only other assumption would be possible food allergy, but less likely.   Advised he stop Diovan HCT.  He declines allergy testing or labs today.  We made the following changes and recommendations:  Patient Instructions   Recommendations:  STOP Diovan HCT  Double up and take Amlodipine 5mg , 2 tablets daily  Begin Hydrochlorothiazide 12.5mg  tablets daily in the morning  Take Benadryl OTC, 25mg , 1/2-1 tablet 2-3 times daily the next day or two.   If not back to normal within the latter part of this week call back  If worse - such as tongue swelling, difficulty breathing, then go to the Emergency Dept.  If doing fine on the above recommendations, then plan a recheck in 3 weeks.  he will recheck in 2-3 wk on BP unless worse or not improving in the next 48 hours.  Mihail was seen today for oral swelling.  Diagnoses and all orders for this visit:  Angioedema, initial encounter  Essential hypertension, benign  Other orders -     amLODipine (NORVASC) 10 MG tablet; Take 1 tablet (10 mg total) by mouth daily. -     hydrochlorothiazide (HYDRODIURIL) 12.5 MG tablet; Take 1 tablet (12.5 mg total) by mouth daily.

## 2015-02-25 MED ORDER — AMLODIPINE BESYLATE 10 MG PO TABS: 10.0000 mg | ORAL_TABLET | Freq: Every day | ORAL | Status: DC

## 2015-02-25 MED ORDER — HYDROCHLOROTHIAZIDE 12.5 MG PO TABS: 12.5000 mg | ORAL_TABLET | Freq: Every day | ORAL | Status: DC

## 2015-02-27 ENCOUNTER — Telehealth: Payer: Self-pay

## 2015-02-27 NOTE — Telephone Encounter (Signed)
Jack [name]'s wife called saying the swelling in his top lip had gone down, but his bottom lip has begun to swell. She wants to know if he should come back in.

## 2015-02-27 NOTE — Telephone Encounter (Deleted)
This patient no showed for their appointment today.Which of the following is necessary for this patient.   A) No follow-up necessary   B) Follow-up urgent. Locate Patient Immediately.   C) Follow-up necessary. Contact patient and Schedule visit in ____ Days.   D) Follow-up Advised. Contact patient and Schedule visit in ____ Days. 

## 2015-02-27 NOTE — Telephone Encounter (Signed)
error 

## 2015-02-27 NOTE — Telephone Encounter (Signed)
I assume he has not taken any more of the Diovan?  Correct?    If that is correct, and if got better then new swelling started, then yes, come back for labs and additional eval.

## 2015-02-27 NOTE — Telephone Encounter (Signed)
LM to CB

## 2015-02-28 NOTE — Telephone Encounter (Signed)
i'm confused.   yesterday he was still having swelling, but c/t to remain off Diovan, and does this mean no swelling in last 2 days?  If he does continue to get the swelling, then return for labs

## 2015-02-28 NOTE — Telephone Encounter (Signed)
Pt is not taking diovan and not having any swelling

## 2015-02-28 NOTE — Telephone Encounter (Signed)
Pt states he is not having any swelling but if swelling then additional eval is needed with labs

## 2015-03-03 ENCOUNTER — Encounter: Payer: Self-pay | Admitting: Medical

## 2015-03-03 ENCOUNTER — Ambulatory Visit (INDEPENDENT_AMBULATORY_CARE_PROVIDER_SITE_OTHER): Payer: Medicare Other | Admitting: Medical

## 2015-03-03 VITALS — BP 110/84 | HR 90 | Temp 98.2°F | Resp 18 | Wt 151.0 lb

## 2015-03-03 DIAGNOSIS — T783XXD Angioneurotic edema, subsequent encounter: Secondary | ICD-10-CM | POA: Diagnosis not present

## 2015-03-03 DIAGNOSIS — I10 Essential (primary) hypertension: Secondary | ICD-10-CM | POA: Insufficient documentation

## 2015-03-03 DIAGNOSIS — M7989 Other specified soft tissue disorders: Secondary | ICD-10-CM | POA: Diagnosis not present

## 2015-03-03 LAB — COMPREHENSIVE METABOLIC PANEL
ALK PHOS: 36 U/L — AB (ref 40–115)
ALT: 12 U/L (ref 9–46)
AST: 17 U/L (ref 10–35)
Albumin: 4 g/dL (ref 3.6–5.1)
BUN: 15 mg/dL (ref 7–25)
CO2: 25 mmol/L (ref 20–31)
Calcium: 8.9 mg/dL (ref 8.6–10.3)
Chloride: 102 mmol/L (ref 98–110)
Creat: 0.78 mg/dL (ref 0.70–1.18)
GLUCOSE: 95 mg/dL (ref 65–99)
POTASSIUM: 4 mmol/L (ref 3.5–5.3)
Sodium: 139 mmol/L (ref 135–146)
Total Bilirubin: 0.6 mg/dL (ref 0.2–1.2)
Total Protein: 6.8 g/dL (ref 6.1–8.1)

## 2015-03-03 LAB — CBC
HCT: 39 % (ref 39.0–52.0)
HEMOGLOBIN: 13.3 g/dL (ref 13.0–17.0)
MCH: 29.9 pg (ref 26.0–34.0)
MCHC: 34.1 g/dL (ref 30.0–36.0)
MCV: 87.6 fL (ref 78.0–100.0)
MPV: 9.3 fL (ref 8.6–12.4)
Platelets: 310 10*3/uL (ref 150–400)
RBC: 4.45 MIL/uL (ref 4.22–5.81)
RDW: 14.8 % (ref 11.5–15.5)
WBC: 7 10*3/uL (ref 4.0–10.5)

## 2015-03-03 MED ORDER — HYDROCHLOROTHIAZIDE 12.5 MG PO TABS: 12.5000 mg | ORAL_TABLET | Freq: Every day | ORAL | Status: DC

## 2015-03-03 NOTE — Progress Notes (Signed)
   Subjective: Chief Complaint  Patient presents with  . feet ankles swelling   Here with wife today.  At his recent visit we stopped Diovan HCT due to lip swelling . The swelling resolved a few days later.   However, since last visit, he hasn't resumed the hydrochlorothiazide without Diovan.  He did double up on Amlodipine though, taking 10mg  daily.   He usually walks for exercise, but hasn't the last few days.   Denies chest pain, palpations, SOB, no recent injury, no leg pain, no asymmetry, no hx/o DVT.  No increase in salt intake.  No other aggravating or relieving factors. No other complaint.  Past Medical History  Diagnosis Date  . Hypertension   . ED (erectile dysfunction)   . CVA (cerebral infarction) 2009, 2015  . Atrial fibrillation   . Hyperlipidemia   . Patent foramen ovale   . Microscopic hematuria   . Elevated PSA   . Cellulitis of face 11/08    [facility]NT   ROS as in subjective   Objective BP 110/84 mmHg  Pulse 90  Temp(Src) 98.2 F (36.8 C)  Resp 18  Wt 151 lb (68.493 kg)  Gen: wd, wn, nad Skin: legs warm, no erythema or ecchymosis, no induration Lungs clear Heart: RRR, normal s1, s2, no murmurs Pedal pulses 1+ bilat Legs nontender, no asymmetry, no mass Ext: 1+ nonpitting ankle edema and foot edema, otherwise no edema, no asymmetry    Assessment: Encounter Diagnoses  Name Primary?  . Leg swelling Yes  . Essential hypertension   . Angioedema of lips, subsequent encounter      Plan: Leg swelling likely due to the fact he stopped Diovan HCT, doubled up on Amlodipine, but hasn't started HCTZ.  BPs are running normal at home per patient.   Advised he start HCTZ, continue Amlodipine 10mg  daily, but pending labs today and pending BP readings ongoing, may need to back down to 5mg  Amlodipine daily.   He fortunately has had no more lip swelling.   F/u pending labs.

## 2015-03-03 NOTE — Patient Instructions (Signed)
    Subjective: Chief Complaint  Patient presents with  . feet ankles swelling    Here for c/o legs swelling x 3 days, bilat.   No pain, no soreness, no recent injury, just swelling.  No prior issues with this.   Since last visit stopped Diovan HCT, he doubled up on amlodipine, but hasn't begun the HCTZ yet.   Denies chest pain, SOB.  Since last visit stopped Diovan, no additional lip swelling.  No other c/o  Past Medical History  Diagnosis Date  . Hypertension   . ED (erectile dysfunction)   . CVA (cerebral infarction) 2009, 2015  . Atrial fibrillation   . Hyperlipidemia   . Patent foramen ovale   . Microscopic hematuria   . Elevated PSA   . Cellulitis of face 11/08    [facility]NT   ROS as in subjective    Objective: BP 110/84 mmHg  Pulse 90  Temp(Src) 98.2 F (36.8 C)  Resp 18  Wt 151 lb (68.493 kg)  Gen: wd, wn nad Leg skin: no warmth or purplish coloration or erythema Lungs clear Heart: rrr, normal s1, s2, no murmurs Legs with mild 1+ ankle edema, slightly pitting, but otherwise no asymmetry, non tender 1+ pulses pedal   Assessment Encounter Diagnoses  Name Primary?  . Leg swelling Yes  . Essential hypertension     Plan: Leg swelling likely due to amlodipine side effects .  Since he had angioedema few weeks ago, we stopped Diovan HCT, but he hasn't started HCTZ sseparate and he did double up on AAmlodipine   At this point advised he c/t to monitor BPs. Begin the HCTZ, but we may end up bbringingaamlodipineback down to 5mg  if his BPs stay normalized.   Labs today.

## 2015-03-13 ENCOUNTER — Telehealth: Payer: Self-pay

## 2015-03-13 NOTE — Telephone Encounter (Signed)
Pt has made appointment

## 2015-04-02 ENCOUNTER — Encounter: Payer: Self-pay | Admitting: Medical

## 2015-04-02 ENCOUNTER — Ambulatory Visit (INDEPENDENT_AMBULATORY_CARE_PROVIDER_SITE_OTHER): Payer: Medicare Other | Admitting: Medical

## 2015-04-02 VITALS — BP 128/70 | HR 84 | Temp 98.1°F | Resp 18 | Ht 62.0 in | Wt 146.4 lb

## 2015-04-02 DIAGNOSIS — M7989 Other specified soft tissue disorders: Secondary | ICD-10-CM | POA: Diagnosis not present

## 2015-04-02 DIAGNOSIS — I4891 Unspecified atrial fibrillation: Secondary | ICD-10-CM | POA: Diagnosis not present

## 2015-04-02 DIAGNOSIS — K439 Ventral hernia without obstruction or gangrene: Secondary | ICD-10-CM

## 2015-04-02 DIAGNOSIS — Z7185 Encounter for immunization safety counseling: Secondary | ICD-10-CM | POA: Insufficient documentation

## 2015-04-02 DIAGNOSIS — Z8673 Personal history of transient ischemic attack (TIA), and cerebral infarction without residual deficits: Secondary | ICD-10-CM | POA: Diagnosis not present

## 2015-04-02 DIAGNOSIS — I10 Essential (primary) hypertension: Secondary | ICD-10-CM | POA: Diagnosis not present

## 2015-04-02 DIAGNOSIS — K862 Cyst of pancreas: Secondary | ICD-10-CM

## 2015-04-02 DIAGNOSIS — Z974 Presence of external hearing-aid: Secondary | ICD-10-CM

## 2015-04-02 DIAGNOSIS — N400 Enlarged prostate without lower urinary tract symptoms: Secondary | ICD-10-CM | POA: Diagnosis not present

## 2015-04-02 DIAGNOSIS — R2689 Other abnormalities of gait and mobility: Secondary | ICD-10-CM | POA: Insufficient documentation

## 2015-04-02 DIAGNOSIS — M79609 Pain in unspecified limb: Secondary | ICD-10-CM | POA: Diagnosis not present

## 2015-04-02 DIAGNOSIS — R3129 Other microscopic hematuria: Secondary | ICD-10-CM

## 2015-04-02 DIAGNOSIS — Z9889 Other specified postprocedural states: Secondary | ICD-10-CM

## 2015-04-02 DIAGNOSIS — Z0000 Encounter for general adult medical examination without abnormal findings: Secondary | ICD-10-CM | POA: Insufficient documentation

## 2015-04-02 DIAGNOSIS — N281 Cyst of kidney, acquired: Secondary | ICD-10-CM

## 2015-04-02 DIAGNOSIS — K863 Pseudocyst of pancreas: Secondary | ICD-10-CM

## 2015-04-02 DIAGNOSIS — K469 Unspecified abdominal hernia without obstruction or gangrene: Secondary | ICD-10-CM | POA: Insufficient documentation

## 2015-04-02 DIAGNOSIS — L989 Disorder of the skin and subcutaneous tissue, unspecified: Secondary | ICD-10-CM

## 2015-04-02 DIAGNOSIS — I493 Ventricular premature depolarization: Secondary | ICD-10-CM

## 2015-04-02 DIAGNOSIS — R202 Paresthesia of skin: Secondary | ICD-10-CM

## 2015-04-02 DIAGNOSIS — R312 Other microscopic hematuria: Secondary | ICD-10-CM

## 2015-04-02 DIAGNOSIS — G8110 Spastic hemiplegia affecting unspecified side: Secondary | ICD-10-CM

## 2015-04-02 DIAGNOSIS — E785 Hyperlipidemia, unspecified: Secondary | ICD-10-CM

## 2015-04-02 DIAGNOSIS — Z7189 Other specified counseling: Secondary | ICD-10-CM | POA: Diagnosis not present

## 2015-04-02 LAB — LIPID PANEL
CHOL/HDL RATIO: 4.8 ratio (ref <=5.0)
CHOLESTEROL: 124 mg/dL — AB (ref 125–200)
HDL: 26 mg/dL — ABNORMAL LOW (ref >=40)
LDL Cholesterol: 81 mg/dL (ref <130)
TRIGLYCERIDES: 83 mg/dL (ref <150)
VLDL: 17 mg/dL (ref <30)

## 2015-04-02 LAB — POCT URINALYSIS DIPSTICK
Bilirubin, UA: NEGATIVE
GLUCOSE UA: NEGATIVE
Ketones, UA: NEGATIVE
Leukocytes, UA: NEGATIVE
NITRITE UA: NEGATIVE
Protein, UA: NEGATIVE
Spec Grav, UA: 1.01
UROBILINOGEN UA: NEGATIVE
pH, UA: 7.5

## 2015-04-03 ENCOUNTER — Other Ambulatory Visit: Payer: Self-pay | Admitting: Medical

## 2015-04-03 ENCOUNTER — Encounter: Payer: Self-pay | Admitting: Medical

## 2015-04-03 DIAGNOSIS — Z79899 Other long term (current) drug therapy: Secondary | ICD-10-CM

## 2015-04-03 DIAGNOSIS — Z974 Presence of external hearing-aid: Secondary | ICD-10-CM | POA: Insufficient documentation

## 2015-04-03 DIAGNOSIS — L989 Disorder of the skin and subcutaneous tissue, unspecified: Secondary | ICD-10-CM | POA: Insufficient documentation

## 2015-04-03 DIAGNOSIS — R3129 Other microscopic hematuria: Secondary | ICD-10-CM | POA: Insufficient documentation

## 2015-04-03 DIAGNOSIS — E785 Hyperlipidemia, unspecified: Secondary | ICD-10-CM | POA: Insufficient documentation

## 2015-04-03 MED ORDER — PRAVASTATIN SODIUM 20 MG PO TABS: 20.0000 mg | ORAL_TABLET | Freq: Every day | ORAL | Status: DC

## 2015-04-03 MED ORDER — HYDROCHLOROTHIAZIDE 25 MG PO TABS: 25.0000 mg | ORAL_TABLET | Freq: Every day | ORAL | Status: DC

## 2015-04-03 MED ORDER — AMLODIPINE BESYLATE 10 MG PO TABS: 10.0000 mg | ORAL_TABLET | Freq: Every day | ORAL | Status: DC

## 2015-04-03 NOTE — Progress Notes (Signed)
Subjective:    Jack [name] is a 81 y.o. male who presents for Medicare Annual Wellness Visit.  Date of last medicare wellness visit was unknown.  Accompanied by his wife today.  Names of Other Physician/Practitioners you currently use:  TYSINGER, DAVID Audelia Acton, PA-C here for primary care Sees eye doctor and dentist yearly Dr. Tollie Eth, cardiology Dr. Serita Butcher, [facility] Dr. Ronald Lobo, GI   Medical Services you may have received from other than [facility] providers in the past year (date may be approximate) routine f/u with cardiology  Preventative care: Last colonoscopy: saw Dr. [name] last year who told him he didn't need anymore  Prior vaccinations: TD or Tdap: 2008  Influenza: yearly free at the pharamcy Pneumococcal: has had both Shingles/Zostavax: up to date about 5 years ago  History reviewed: allergies, current medications, past family history, past medical history, past social history, past surgical history and problem list  Current Problems (verified) Patient Active Problem List   Diagnosis Date Noted  . Microscopic hematuria 04/03/2015  . Hyperlipidemia 04/03/2015  . Wears hearing aid 04/03/2015  . Paresthesia and pain of left extremity 04/02/2015  . Encounter for health maintenance examination in adult 04/02/2015  . Vaccine counseling 04/02/2015  . Hernia of abdominal cavity 04/02/2015  . Impaired gait and mobility 04/02/2015  . Leg swelling 03/03/2015  . Essential hypertension 03/03/2015  . Spastic hemiplegia affecting nondominant side 12/14/2013  . ICH (intracerebral hemorrhage) 08/02/2013  . Hx of mitral valve repair   . History of stroke   . History of atrial fibrillation   . Cyst and pseudocyst of pancreas 09/13/2007  . PVC (premature ventricular contraction)   . History of renal cyst   . BPH (benign prostatic hypertrophy)      Immunization History  Administered Date(s) Administered  . Influenza-Unspecified 05/12/2013  . Pneumococcal  Conjugate-13 04/07/2007  . Pneumococcal Polysaccharide-23 08/04/2013  . Tdap 04/07/2007    Risk Factors: Tobacco History  Smoking status  . Never Smoker   Smokeless tobacco  . Never Used   male does not smoke.  Patient is not a former smoker. Are there smokers in your home (other than you)?  No  Alcohol Current alcohol use: none  Caffeine Current caffeine use: very limited  Exercise Current exercise habits: Home exercise routine includes hand bike at the gym 5 days per week.   Nutrition/Diet Current diet: in general, a "healthy" diet    Cardiac risk factors: advanced age (older than 31 for men, 9 for women), dyslipidemia, hypertension and has afib.  Depression Screen Nurse depression screen reviewed.  Activities of Daily Living Nurse ADLs screen reviewed.  Vision Difficulties: No  Hearing Difficulties: Yes Wears hearing aids  Cognition  Do you feel that you have a problem with memory? No  Do you often misplace items? No  Do you feel safe at home?  Yes  Advanced directives Does patient have a [place]? Yes Does patient have a Living Will? Yes    Objective:    Blood pressure 128/70, pulse 84, temperature 98.1 F (36.7 C), temperature source Oral, resp. rate 18, height 5\' 2"  (1.575 m), weight 146 lb 6.4 oz (66.407 kg). Body mass index is 26.77 kg/(m^2).  General appearance: alert, no distress, WD/WN, white male, somewhat hard of hearing, using hearing aids Cognitive Testing  Alert? Yes  Normal Appearance?Yes  Oriented to person? Yes  Place? Yes   Time? Yes  Recall of three objects?  Yes  Can perform simple  calculations? Yes  Displays appropriate judgment?Yes  Can read the correct time from a watch face?Yes  HEENT: normocephalic, sclerae anicteric, TMs pearly, nares patent, no discharge or erythema, pharynx normal Oral cavity: MMM, no lesions, teeth in good repair Neck: supple, no lymphadenopathy, no thyromegaly, no masses, no  bruits Heart: irregularly irregular, normal S1, S2, no murmurs Lungs: CTA bilaterally, no wheezes, rhonchi, or rales Abdomen: +bs, soft, non tender, non distended, no masses, no hepatomegaly, no splenomegaly Musculoskeletal: non tender, no swelling, no obvious deformity Extremities: no edema, no cyanosis, no clubbing Pulses: 1+ symmetric, upper and lower extremities, normal cap refill Neurological: +gait is cautious, uses cane, mildly antalgic gait, mildly decreased sensation and strength left UE and LE throughout, otherwise alert, oriented x 3, CN2-12 intact, strength normal upper extremities and lower extremities, sensation normal throughout, DTRs 2+ throughout, no cerebellar signs, gait normal Psychiatric: normal affect, behavior normal, pleasant  Declines GU/rectal exam today    Assessment:   Encounter Diagnoses  Name Primary?  . Essential hypertension Yes  . Spastic hemiplegia affecting nondominant side   . PVC (premature ventricular contraction)   . Leg swelling   . Atrial fibrillation, unspecified   . History of stroke   . Hx of mitral valve repair   . BPH (benign prostatic hypertrophy)   . Paresthesia and pain of left extremity   . Encounter for health maintenance examination in adult   . Vaccine counseling   . Ventral hernia, recurrence not specified   . Impaired gait and mobility   . Cyst and pseudocyst of pancreas   . History of renal cyst   . Microscopic hematuria   . Hyperlipidemia   . Wears hearing aid      Plan:   During the course of the visit the patient was educated and counseled about appropriate screening and preventive services including:    Influenza vaccine  Glaucoma screening  Nutrition counseling   Advanced directives: has an advanced directive - a copy HAS NOT been provided.  Screening recommendations, referrals:  Vaccinations: Tdap vaccine up to date Influenza vaccine gets yearly free at pharmacy Pneumococcal vaccine up to  date Shingles vaccine up to date  Nutrition assessed and recommended he c/t healthy lifestyle Colonoscopy - no longer recommended by GI given age and morbidity Recommended yearly ophthalmology/optometry visit for glaucoma screening and checkup Recommended yearly dental visit for hygiene and checkup Advanced directives - need to get [place] a copy  Conditions/risks identified: Changing skin lesions Edema HTN Not on lipid medications See AV summary  Medicare Attestation I have personally reviewed: The patient's medical and social history Their use of alcohol, tobacco or illicit drugs Their current medications and supplements The patient's functional ability including ADLs,fall risks, home safety risks, cognitive, and hearing and visual impairment Diet and physical activities Evidence for depression or mood disorders  The patient's weight, height, BMI, and visual acuity have been recorded in the chart.  I have made referrals, counseling, and provided education to the patient based on review of the above and I have provided the patient with a written personalized care plan for preventive services.     Crisoforo Oxford, PA-C   04/03/2015

## 2015-04-03 NOTE — Patient Instructions (Signed)
MEDICARE PREVENTATIVE SERVICES (MALE) AND PERSONALIZED PLAN for  Jack [name] April 03, 2015  CONDITIONS OR RISKS IDENTIFIED TODAY:  Changing skin lesion of face  Leg swelling  Not on medication for cholesterol  Microscopic hematuria   SPECIFIC RECOMMENDATIONS:  continue healthy diet and routine exercise See your eye doctor yearly for routine vision care. See your dentist yearly for routine dental care including hygiene visits twice yearly. See Dr. Wynonia Lawman yearly See Urology at this time for recheck on microscopic hematuria and renal cyst.  They were following this yearly as of 2012, so it has been a few years since last recheck See your dermatologist for facial skin lesion Get [place] a copy of your advanced directives Leg swelling - take Hydrochlorothiazide 25mg  daily Continue Amlodipine 5mg  daily STOP Diovan/valsartan due to angioedema/facial swelling Consider wearing OTC compression hose daily given the leg swelling Pending labs, we may restart cholesterol medication.  You are up to date on vaccines.  Get your flu shot yearly  Return pending labs   GENERAL RECOMMENDATIONS FOR GOOD HEALTH:  Supplements:  . Take a daily baby Aspirin 81mg  at bedtime for heart health unless you have a history of gastrointestinal bleed, allergy to aspirin, or are already taking higher dose Aspirin or other antiplatelet or blood thinner medication.   . Consume 1200 mg of Calcium daily through dietary calcium or supplement if you are male age 79 or older, or men 70 and older.   Men aged 47-70 should consume 1000 mg of Calcium daily. . Take 600 IU of Vitamin D daily.  Take 800 IU of Calcium daily if you are older than age 31.  . Take a general multivitamin daily.   Healthy diet: Eat a variety of foods, including fruits, vegetables, vegetable protein such as beans, lentils, tofu, and grains, such as rice.  Limit meat or animal protein, but if you eat meat, choose leans cuts such as chicken, fish,  or [place].  Drink plenty of water daily.  Decrease saturated fat in the diet, avoid lots of red meat, processed foods, sweets, fast foods, and fried foods.  Limit salt and caffeine intake.  Exercise: Aerobic exercise helps maintain good heart health. Weight bearing exercise helps keep bones and muscles working strong.  We recommend at least 30-40 minutes of exercise most days of the week.   Fall prevention: Falls are the leading cause of injuries, accidents, and accidental deaths in people over the age of 67. Falling is a real threat to your ability to live on your own.  Causes include poor eyesight or poor hearing, illness, poor lighting, throw rugs, clutter in your home, and medication side effects causing dizziness or balance problems.  Such medications can include medications for depression, sleep problems, high blood pressure, diabetes, and heart conditions.   PREVENTION  Be sure your home is as safe as possible. Here are some tips:  Wear shoes with non-skid soles (not house slippers).   Be sure your home and outside area are well lit.   Use night lights throughout your house, including hallways and stairways.   Remove clutter and clean up spills on floors and walkways.   Remove throw rugs or fasten them to the floor with carpet tape. Tack down carpet edges.   Do not place electrical cords across pathways.   Install grab bars in your bathtub, shower, and toilet area. Towel bars should not be used as a grab bar.   Install handrails on both sides of stairways.   Do  not climb on stools or stepladders. Get someone else to help with jobs that require climbing.   Do not wax your floors at all, or use a non-skid wax.   Repair uneven or unsafe sidewalks, walkways or stairs.   Keep frequently used items within reach.   Be aware of pets so you do not trip.  Get regular check-ups from your doctor, and take good care of yourself:  Have your eyes checked every year for vision changes,  cataracts, glaucoma, and other eye problems. Wear eyeglasses as directed.   Have your hearing checked every 2 years, or anytime you or others think that you cannot hear well. Use hearing aids as directed.   See your caregiver if you have foot pain or corns. Sore feet can contribute to falls.   Let your caregiver know if a medicine is making you feel dizzy or making you lose your balance.   Use a cane, walker, or wheelchair as directed. Use walker or wheelchair brakes when getting in and out.   When you get up from bed, sit on the side of the bed for 1 to 2 minutes before you stand up. This will give your blood pressure time to adjust, and you will feel less dizzy.   If you need to go to the bathroom often, consider using a bedside commode.  Disease prevention:  If you smoke or chew tobacco, find out from your caregiver how to quit. It can literally save your life, no matter how long you have been a tobacco user. If you do not use tobacco, never begin. Medicare does cover some smoking cessation counseling.  Maintain a healthy diet and normal weight. Increased weight leads to problems with blood pressure and diabetes. We check your height, weight, and BMI as part of your yearly visit.  The Body Mass Index or BMI is a way of measuring how much of your body is fat. Having a BMI above 27 increases the risk of heart disease, diabetes, hypertension, stroke and other problems related to obesity. Your caregiver can help determine your BMI and based on it develop an exercise and dietary program to help you achieve or maintain this important measurement at a healthful level.  High blood pressure causes heart and blood vessel problems.  Persistent high blood pressure should be treated with medicine if weight loss and exercise do not work.  We check your blood pressure as part of your yearly visit.  Avoid drinking alcohol in excess (more than two drinks per day).  Avoid use of street drugs. Do not share  needles with anyone. Ask for professional help if you need assistance or instructions on stopping the use of alcohol, cigarettes, and/or drugs.  Brush your teeth twice a day with fluoride toothpaste, and floss once a day. Good oral hygiene prevents tooth decay and gum disease. The problems can be painful, unattractive, and can cause other health problems. Visit your dentist for a routine oral and dental checkup and preventive care every 6-12 months.   See your eye doctor yearly for routine screening for things like glaucoma.  Look at your skin regularly.  Use a mirror to look at your back. Notify your caregivers of changes in moles, especially if there are changes in shapes, colors, a size larger than a pencil eraser, an irregular border, or development of new moles.  Safety:  Use seatbelts 100% of the time, whether driving or as a passenger.  Use safety devices such as hearing protection if you  work in environments with loud noise or significant background noise.  Use safety glasses when doing any work that could send debris in to the eyes.  Use a helmet if you ride a bike or motorcycle.  Use appropriate safety gear for contact sports.  Talk to your caregiver about gun safety.  Use sunscreen with a SPF (or skin protection factor) of 15 or greater.  Lighter skinned people are at a greater risk of skin cancer. Don't forget to also wear sunglasses in order to protect your eyes from too much damaging sunlight. Damaging sunlight can accelerate cataract formation.   If you have multiple sexual partners, or if you are not in a monogamous relationship, practice safe sex. Use condoms. Condoms are used to help reduce the spread of sexually transmitted infections (or STIs).  Consider an HIV test if you have never been tested.  Consider routine screening for STIs if you have multiple sexual partners.   Keep carbon monoxide and smoke detectors in your home functioning at all times. Change the batteries every 6  months or use a model that plugs into the wall or is hard wired in.   END OF LIFE PLANNING/ADVANCED DIRECTIVES Advance health-care planning is deciding the kind of care you want at the end of life. While alert competent adults are able to exercise their rights to make health care and financial decisions, problems arise when an individual becomes unconscious, incapacitated, or otherwise unable to communicate or make such decisions. Advance health care directives are the legal documents in which you give written instructions about your choices limited, aggressive or palliative care if, in the future, you cannot speak for yourself.  Advanced directives include the following: [place] allows you to appoint someone to act as your health care agent to make health care decisions for you should it be determined by your health care provider that you are no longer able to make these decisions for yourself.  A Living Will is a legal document in which you can declare that under certain conditions you desire your life not be prolonged by extraordinary or artificial means during your last illness or when you are near death. We can provide you with sample advanced directives, you can get an attorney to prepare these for you, or you can visit [place] Secretary of State's website for additional information and resources at [email or web address]  Further, I recommend you have an attorney prepare a Will and Durable Power of Attorney if you haven't done so already.  Please get [place] a copy of your health care Advanced Directives.   PREVENTATIV E CARE RECOMMENDATIONS:  Vaccinations: We recommend the following vaccinations as part of your preventative care:  Pneumococcal vaccine is recommended to protect against certain types of pneumonia.  This is normally recommended for adults age 43 or older once, or up to every 5 years for those at high risk.  The vaccine is also recommended for  adults younger than 79 years old with certain underlying conditions that make them high risk for pneumonia.  Influenza vaccine is recommended to protect against seasonal influenza or "the flu." Influenza is a serious disease that can lead to hospitalization and sometimes even death. Traditional flu vaccines (called trivalent vaccines) are made to protect against three flu viruses; an influenza A (H1N1) virus, an influenza A (H3N2) virus, and an influenza B virus. In addition, there are flu vaccines made to protect against four flu viruses (called "quadrivalent" vaccines). These vaccines protect against the  same viruses as the trivalent vaccine and an additional B virus.  We recommend the high dose influenza vaccine to those 65 years and older.  Hepatitis B vaccine to protect against a form of infection of the liver by a virus acquired from blood or body fluids, particularly for high risk groups.  Td or Tdap vaccine to protect against Tetanus, diphtheria and pertussis which can be very serious.  These diseases are caused by bacteria.  Diphtheria and pertussis are spread from person to person through coughing or sneezing.  Tetanus enters the body through cuts, scratches, or wounds.  Tetanus (Lockjaw) causes painful muscle tightening and stiffness, usually all over the body.  Diphtheria can cause a thick coating to form in the back of the throat.  It can lead to breathing problems, paralysis, heart failure, and death.  Pertussis (Whooping Cough) causes severe coughing spells, which can cause difficulty breathing, vomiting and disturbed sleep.  Td or Tdap is usually given every 10 years.  Shingles vaccine to protect against Varicella Zoster if you are older than age 80, or younger than 79 years old with certain underlying illness.    Cancer Screening: Most routine colon cancer screening begins at the age of 83.  Subsequent colonoscopies are performed either every 5-10 years for normal screening, or every 2-5  years for higher risks patients, up until age 44 years of age. Annual screening is done with easy to use take-home tests to check for hidden blood in the stool called hemoccult tests.  Sigmoidoscopy or colonoscopy can detect the earliest forms of colon cancer and is life saving. These tests use a small camera at the end of a tube to directly examine the colon.   Prostate cancer screening usually begins at age 10 years old, or younger age for those with higher risk.  Those at higher risk include African-Americans or having a family history of prostate cancer. There are two types of tests for prostate cancer - Prostate-specific antigen (PSA) testing. Recent studies raise questions about prostate cancer using PSA and you should discuss this with your caregiver.  The other type of test is the digital rectal exam (in which your doctor's lubricated and gloved finger feels for enlargement of the prostate through the anus).  We routinely stop testing at age 18 years of age.  Osteoporosis Screening: Screening for osteoporosis usually begins at age 69 for women, and can be done as frequent as every 2 years.  However, women or men with higher risk of osteoporosis may be screened earlier than age 7.  Osteoporosis or low bone mass is diminished bone strength from alterations in bone architecture leading to bone fragility and increased fracture risk.     Cardiovascular Screening: Fat and cholesterol leaves deposits in your arteries that can block them. This causes heart disease and vessel disease elsewhere in your body.  If your cholesterol is found to be high, or if you have heart disease or certain other medical conditions, then you may need to have your cholesterol monitored frequently and be treated with medication. Cardiovascular screening in the form of lab tests for cholesterol, HDL and triglycerides can be done every 5 years.  A screening electrocardiogram can be done as part of the Welcome to Medicare  physical.  Diabetes Screening: Diabetes screening can be done at least every 3 years for those with risk factors,  or every 6-31months for prediabetic patients.  Screening includes fasting blood sugar test or glucose tolerance test.  Risk factors include hypertension,  dyslipidemia, obesity, previously abnormal glucose tests, family history of diabetes, age 63 years or older, and history of gestations diabetes.   AAA (abdominal aortic aneurysm) Screening: Medicare allows for a one time ultrasound to screen for abdominal aortic aneurysm if done as a referral as part of the Welcome to Medicare exam.  Men eligible for this screening include those men between age 31-20 years of age who have smoked at least 100 cigarettes in his lifetime and/or has a family history of AAA.  HIV Screening:  Medicare allows for yearly screening for patients at high risk for contracting HIV disease.

## 2015-06-12 ENCOUNTER — Other Ambulatory Visit: Payer: Self-pay | Admitting: Medical

## 2015-06-12 ENCOUNTER — Telehealth: Payer: Self-pay

## 2015-06-12 NOTE — Telephone Encounter (Signed)
pls check with pharmacy as back in September I wrote for 90 day supple with 3 refills so he shouldn't be out of medication

## 2015-06-12 NOTE — Telephone Encounter (Signed)
Refill request for Amlodipine 10mg  #90

## 2015-06-12 NOTE — Telephone Encounter (Signed)
They said it was an error and they've got it taken care of, so everything is fine.

## 2015-07-16 ENCOUNTER — Encounter: Payer: Self-pay | Admitting: Gastroenterology

## 2015-07-17 ENCOUNTER — Encounter: Payer: Self-pay | Admitting: Gastroenterology

## 2015-11-14 ENCOUNTER — Other Ambulatory Visit: Payer: Self-pay | Admitting: Medical

## 2016-01-12 ENCOUNTER — Telehealth: Payer: Self-pay | Admitting: Medical

## 2016-01-12 NOTE — Telephone Encounter (Signed)
Left message for pt to call. Needs cpe/annual wellness after 04/01/2016.

## 2016-02-05 NOTE — Telephone Encounter (Signed)
Called pt and spoke to him. He stated he did not want to schedule an appt right now and he would call back at a later time.

## 2016-05-05 ENCOUNTER — Other Ambulatory Visit: Payer: Self-pay | Admitting: Medical

## 2016-05-06 NOTE — Telephone Encounter (Signed)
Refill but schedule medicare wellness/CPX as last visit was >1 year ago

## 2016-07-03 ENCOUNTER — Inpatient Hospital Stay (HOSPITAL_BASED_OUTPATIENT_CLINIC_OR_DEPARTMENT_OTHER)
Admission: EM | Admit: 2016-07-03 | Discharge: 2016-07-09 | DRG: 065 | Disposition: A | Discharge status: Rehabilitation Facility | Payer: Medicare Other | Attending: Internal Medicine | Admitting: Internal Medicine

## 2016-07-03 ENCOUNTER — Encounter (HOSPITAL_BASED_OUTPATIENT_CLINIC_OR_DEPARTMENT_OTHER): Payer: Self-pay | Admitting: Emergency Medicine

## 2016-07-03 ENCOUNTER — Emergency Department (HOSPITAL_BASED_OUTPATIENT_CLINIC_OR_DEPARTMENT_OTHER): Payer: Medicare Other

## 2016-07-03 DIAGNOSIS — I6339 Cerebral infarction due to thrombosis of other cerebral artery: Secondary | ICD-10-CM

## 2016-07-03 DIAGNOSIS — I10 Essential (primary) hypertension: Secondary | ICD-10-CM | POA: Diagnosis not present

## 2016-07-03 DIAGNOSIS — Z8673 Personal history of transient ischemic attack (TIA), and cerebral infarction without residual deficits: Secondary | ICD-10-CM | POA: Diagnosis not present

## 2016-07-03 DIAGNOSIS — Z888 Allergy status to other drugs, medicaments and biological substances status: Secondary | ICD-10-CM

## 2016-07-03 DIAGNOSIS — Z79899 Other long term (current) drug therapy: Secondary | ICD-10-CM

## 2016-07-03 DIAGNOSIS — Q211 Atrial septal defect: Secondary | ICD-10-CM

## 2016-07-03 DIAGNOSIS — I63412 Cerebral infarction due to embolism of left middle cerebral artery: Secondary | ICD-10-CM

## 2016-07-03 DIAGNOSIS — G459 Transient cerebral ischemic attack, unspecified: Secondary | ICD-10-CM | POA: Diagnosis present

## 2016-07-03 DIAGNOSIS — Z9889 Other specified postprocedural states: Secondary | ICD-10-CM

## 2016-07-03 DIAGNOSIS — R531 Weakness: Secondary | ICD-10-CM | POA: Diagnosis not present

## 2016-07-03 DIAGNOSIS — Z91199 Patient's noncompliance with other medical treatment and regimen due to unspecified reason: Secondary | ICD-10-CM

## 2016-07-03 DIAGNOSIS — I6930 Unspecified sequelae of cerebral infarction: Secondary | ICD-10-CM

## 2016-07-03 DIAGNOSIS — I4891 Unspecified atrial fibrillation: Secondary | ICD-10-CM | POA: Diagnosis present

## 2016-07-03 DIAGNOSIS — I480 Paroxysmal atrial fibrillation: Secondary | ICD-10-CM | POA: Diagnosis present

## 2016-07-03 DIAGNOSIS — Z7982 Long term (current) use of aspirin: Secondary | ICD-10-CM

## 2016-07-03 DIAGNOSIS — I6349 Cerebral infarction due to embolism of other cerebral artery: Principal | ICD-10-CM | POA: Diagnosis present

## 2016-07-03 DIAGNOSIS — D62 Acute posthemorrhagic anemia: Secondary | ICD-10-CM

## 2016-07-03 DIAGNOSIS — E785 Hyperlipidemia, unspecified: Secondary | ICD-10-CM | POA: Diagnosis present

## 2016-07-03 DIAGNOSIS — G8114 Spastic hemiplegia affecting left nondominant side: Secondary | ICD-10-CM | POA: Diagnosis present

## 2016-07-03 DIAGNOSIS — I639 Cerebral infarction, unspecified: Secondary | ICD-10-CM | POA: Diagnosis present

## 2016-07-03 DIAGNOSIS — R29702 NIHSS score 2: Secondary | ICD-10-CM | POA: Diagnosis present

## 2016-07-03 DIAGNOSIS — Q2112 Patent foramen ovale: Secondary | ICD-10-CM

## 2016-07-03 DIAGNOSIS — I272 Pulmonary hypertension, unspecified: Secondary | ICD-10-CM | POA: Diagnosis present

## 2016-07-03 DIAGNOSIS — E876 Hypokalemia: Secondary | ICD-10-CM | POA: Diagnosis present

## 2016-07-03 DIAGNOSIS — Z9119 Patient's noncompliance with other medical treatment and regimen: Secondary | ICD-10-CM

## 2016-07-03 DIAGNOSIS — R2689 Other abnormalities of gait and mobility: Secondary | ICD-10-CM

## 2016-07-03 HISTORY — DX: Cerebral infarction, unspecified: I63.9

## 2016-07-03 LAB — BASIC METABOLIC PANEL
Anion gap: 8 (ref 5–15)
BUN: 14 mg/dL (ref 6–20)
CALCIUM: 9.1 mg/dL (ref 8.9–10.3)
CO2: 28 mmol/L (ref 22–32)
CREATININE: 0.82 mg/dL (ref 0.61–1.24)
Chloride: 102 mmol/L (ref 101–111)
Glucose, Bld: 100 mg/dL — ABNORMAL HIGH (ref 65–99)
Potassium: 3.7 mmol/L (ref 3.5–5.1)
SODIUM: 138 mmol/L (ref 135–145)

## 2016-07-03 LAB — CBC WITH DIFFERENTIAL/PLATELET
BASOS PCT: 1 %
Basophils Absolute: 0 10*3/uL (ref 0.0–0.1)
EOS ABS: 0.2 10*3/uL (ref 0.0–0.7)
Eosinophils Relative: 2 %
HCT: 38.9 % — ABNORMAL LOW (ref 39.0–52.0)
HEMOGLOBIN: 12.9 g/dL — AB (ref 13.0–17.0)
Lymphocytes Relative: 16 %
Lymphs Abs: 1.3 10*3/uL (ref 0.7–4.0)
MCH: 29.6 pg (ref 26.0–34.0)
MCHC: 33.2 g/dL (ref 30.0–36.0)
MCV: 89.2 fL (ref 78.0–100.0)
MONO ABS: 0.7 10*3/uL (ref 0.1–1.0)
MONOS PCT: 8 %
NEUTROS PCT: 73 %
Neutro Abs: 5.8 10*3/uL (ref 1.7–7.7)
PLATELETS: 295 10*3/uL (ref 150–400)
RBC: 4.36 MIL/uL (ref 4.22–5.81)
RDW: 14.3 % (ref 11.5–15.5)
WBC: 8 10*3/uL (ref 4.0–10.5)

## 2016-07-03 LAB — URINALYSIS, ROUTINE W REFLEX MICROSCOPIC
Bilirubin Urine: NEGATIVE
Glucose, UA: NEGATIVE mg/dL
Hgb urine dipstick: NEGATIVE
KETONES UR: NEGATIVE mg/dL
LEUKOCYTES UA: NEGATIVE
NITRITE: NEGATIVE
PH: 8 (ref 5.0–8.0)
Protein, ur: NEGATIVE mg/dL
SPECIFIC GRAVITY, URINE: 1.006 (ref 1.005–1.030)

## 2016-07-03 MED ORDER — ASPIRIN EC 81 MG PO TBEC: 81.0000 mg | DELAYED_RELEASE_TABLET | Freq: Every day | ORAL | Status: DC

## 2016-07-03 MED ORDER — ACETAMINOPHEN 160 MG/5ML PO SOLN: 650.0000 mg | ORAL | Status: DC | PRN

## 2016-07-03 MED ORDER — ASPIRIN EC 325 MG PO TBEC
325.0000 mg | DELAYED_RELEASE_TABLET | Freq: Every day | ORAL | Status: DC
  Administered 2016-07-04 – 2016-07-09 (×6): 325 mg via ORAL
  Filled 2016-07-03 (×5): qty 1

## 2016-07-03 MED ORDER — ACETAMINOPHEN 650 MG RE SUPP: 650.0000 mg | RECTAL | Status: DC | PRN

## 2016-07-03 MED ORDER — ASPIRIN EC 325 MG PO TBEC
325.0000 mg | DELAYED_RELEASE_TABLET | Freq: Once | ORAL | Status: DC
  Filled 2016-07-03 (×2): qty 1

## 2016-07-03 MED ORDER — ENOXAPARIN SODIUM 40 MG/0.4ML ~~LOC~~ SOLN
40.0000 mg | Freq: Every day | SUBCUTANEOUS | Status: DC
  Administered 2016-07-04 – 2016-07-09 (×6): 40 mg via SUBCUTANEOUS
  Filled 2016-07-03 (×6): qty 0.4

## 2016-07-03 MED ORDER — AMLODIPINE BESYLATE 10 MG PO TABS
10.0000 mg | ORAL_TABLET | Freq: Every day | ORAL | Status: DC
  Administered 2016-07-04 – 2016-07-09 (×6): 10 mg via ORAL
  Filled 2016-07-03 (×6): qty 1

## 2016-07-03 MED ORDER — SODIUM CHLORIDE 0.9 % IV SOLN
INTRAVENOUS | Status: DC
  Administered 2016-07-04 (×2): 1 mL via INTRAVENOUS
  Administered 2016-07-04: 08:00:00 via INTRAVENOUS

## 2016-07-03 MED ORDER — STROKE: EARLY STAGES OF RECOVERY BOOK
Freq: Once | Status: AC
  Administered 2016-07-04: 02:00:00

## 2016-07-03 MED ORDER — ACETAMINOPHEN 325 MG PO TABS: 650.0000 mg | ORAL_TABLET | ORAL | Status: DC | PRN

## 2016-07-03 MED ORDER — SENNOSIDES-DOCUSATE SODIUM 8.6-50 MG PO TABS
1.0000 | ORAL_TABLET | Freq: Every evening | ORAL | Status: DC | PRN
  Administered 2016-07-08: 1 via ORAL
  Filled 2016-07-03: qty 1

## 2016-07-03 NOTE — ED Notes (Signed)
States has had weakness to right hand since 0830 and right foot. 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 Uses cane, slight weakness to right hand grip.

## 2016-07-03 NOTE — ED Notes (Signed)
Missed IV attempt x 2, tol well to right arm

## 2016-07-03 NOTE — ED Notes (Signed)
Pt on automatic VS, continuous pulse ox and cardiac monitoring. 

## 2016-07-03 NOTE — ED Notes (Signed)
No change in neuro status. Slight weakness to left and right hand grips, has not attempted to get out off stretcher, wife at bedside, denies pain.

## 2016-07-03 NOTE — Progress Notes (Signed)
Patient arrived to unit alert and oriented x 4. Patient ambulated to bathroom with 1 assist and walker. Patient states he has a mild frontal h/a but unable to rate on 0-10 pain scale. Q 2 hr neuro checks initiated. Patient placed on cardiac monitor, CCMD verification complete. Patients medical history, current medications, allergies and treatment plan reviewed with patient and wife. Patient and wife v/u. RN will continue to monitor.

## 2016-07-03 NOTE — Progress Notes (Signed)
MD paged

## 2016-07-03 NOTE — ED Notes (Signed)
Attempted to give report to recieiving nurse at [facility] but unavailable, will call back

## 2016-07-03 NOTE — Progress Notes (Signed)
Dr. Nicole Kindred, neurology with patient at this time.

## 2016-07-03 NOTE — ED Notes (Signed)
Awaiting transport from [place]

## 2016-07-03 NOTE — ED Notes (Signed)
ED Provider at bedside discussing admission.

## 2016-07-03 NOTE — ED Notes (Signed)
Patient transported to CT 

## 2016-07-03 NOTE — ED Notes (Signed)
Consult to [facility] via [facility] @ 4:17 pm

## 2016-07-03 NOTE — Progress Notes (Signed)
   07/03/16 1649  Step #1 Pre-Swallow Screen  History of dysphagia No  Dysphagia diet prior to admission No  Awake and alert, responding to speech? Yes  Positioned upright, with some head control? Yes  Cough on command? Yes  Maintain control of their saliva? Yes  Lick top and bottom lip? Yes  Breathe freely and maintain O2 sats? Yes  Voice quality clear (No "wet" gurgly or hoarse sounds) Yes  R Upper  Breath Sounds Clear  L Upper Breath Sounds Clear  R Lower Breath Sounds Clear  L Lower Breath Sounds Clear  Temp 98.7 F (37.1 C)  Step #2 Swallow Screen  Patient was kept strictly NPO prior to this screen No (Pt states he had water earlier.)  Water via cup No problems  Water via straw No problems  Cracker No problems  Lung sounds changed after swallow screen No  Passed swallow screen Yes, see row information   Stroke swallow screen completed at [facility]. Heart healthy diet was ordered by admitting physician. Patient does not show any signs of difficulty swallowing at this time. RN will continue to monitor. patient.

## 2016-07-03 NOTE — ED Notes (Signed)
Paged Neurology @ [phone] @ 4:05 pm

## 2016-07-03 NOTE — ED Notes (Signed)
Pt able to stand and pivot to and from wheelchair to bed using cane with no assistance, only standby assistance. States he feels weaker then normal. Usually walks with a cane, sometimes able to not have the cane.

## 2016-07-03 NOTE — Plan of Care (Signed)
[facility] Plan of Care Note  Patient: Jack [name]    [id number]  PCP: Crisoforo Oxford, PA-C    DOB: Jan 20, 1934  DOS: 07/03/2016   Received a phone call from Facility: [facility] [place] regarding transfer of Jack [name]. Requesting: Dr. Laneta Simmers Patient presented with complains of worsening of prior left-sided weakness. One day history. CT of the head unremarkable. EDP discussed with neurology recommended the patient to be transferred to [facility] for further workup.  Plan of care: The patient will be accepted for admission to [facility], telemetry unit. Please call neurology once the patient arrives here.  Author: Berle Mull, MD [facility] Pager: [phone] 07/03/2016 5:40 PM   If 7PM-7AM, please contact night-coverage at [email or web address], password [facility]

## 2016-07-03 NOTE — H&P (Signed)
History and Physical    Jack [name] [id number] DOB: 02/07/1934 DOA: 07/03/2016  Referring MD/NP/PA: Dr.  PCP: Jack [name], Jack Audelia Acton, PA-C  Patient coming from: [facility]  Chief Complaint: Right-sided weakness  HPI: Jack [name] is a 82 y.o. male with medical history significant of HTN, HLD, CVA with residual left-sided weakness, atrial fibrillation, s/p MVR; presents with complaints of right-sided weakness. Patient notes that he got up this morning around 8 AM and was able to walk with the assistance of a cane to eat his breakfast. Following breakfast he noted that he had difficulty utilizing his right arm that he uses to hold his cane. He was also noted to have weakness in right lower extremity as well which was also negative. Patient denies any falls, nausea, vomiting, change in visual field, headache, shortness of breath, cough, chest pain, or palpitation. He takes 81 mg aspirin daily. Denies any history of GI bleed. Patient has had irregular heart beat for a few years now, but has never been on anticoagulation. Followed by Jack [name] of cardiology.  In reviewing cardiology records from Jack [name] from 2015 it appears that patient was thought not to have been in A. fib, question holter monitor, and therefore was deemed not to be a candidate for anticoagulation at that time. It appears patient also was noted to have history of intercranial hemorrhage .  ED Course:  CT scan of head showed general atrophy. Neurology was consulted and recommended transfer to [facility] for further workup.  Review of Systems: As per HPI otherwise 10 point review of systems negative.   Past Medical History:  Diagnosis Date  . Atrial fibrillation ([place])    Jack [name]  . BPH (benign prostatic hypertrophy) 2012   prior therapy with finasteride, but as of 03/2015 no c/o; Jack [name]  . Cellulitis of face 11/08   [facility]  . CVA (cerebral infarction) 2009, 2015  . Echocardiogram  abnormal 02/11/2010   LVEF 70%, severe mitral regurgitation, pulm HTN   . ED (erectile dysfunction)    Viagra prn, consult with Urology 2012  . Elevated PSA 2012   BPH; Jack [name], [facility]; no more PSAs needed as of 2012 consult  . Gait abnormality    uses cane, s/p CVA  . Hemiparesis affecting left side as late effect of cerebrovascular accident ([place])   . Hiatal hernia    per 2008 EUS  . Hyperlipidemia   . Hypertension   . Microscopic hematuria    chronic, eval 2012 with Urology, benign at that time  . Obstructive uropathy 2012   Jack [name]  . Pancreatic mass 01/2006   inconsequential per CT 2007  . 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 Patent foramen ovale    hx/o, notation of fistula on echocardiogram from 2009  . Prophylactic antibiotic 2016   requires due to valve disease  . Schatzki's ring 2008   per endoscopic ultrasound; Jack [name]. Jack [name]  . Stroke ([place])   . Ventral hernia    surgery consult 01/2010 with Jack [name], elective repair if desired , but he declined  . Wears hearing aid     Past Surgical History:  Procedure Laterality Date  . CARDIAC CATHETERIZATION  02/2010   LVEF 70%, severe mitral regurgitation, pulm HTN; prior in 01/2006  . INGUINAL HERNIA REPAIR     bilat  . MITRAL VALVE REPAIR  01/2006   mitral repair with  Maze procedure, tricuspid annuloplasty, Jack [name]     reports that he has never smoked. He has never used smokeless tobacco. He reports that he does not drink alcohol or use drugs.  Allergies  Allergen Reactions  . Valsartan Swelling    angioedema    Family History  Problem Relation Age of Onset  . Family history unknown: Yes    Prior to Admission medications   Medication Sig Start Date End Date Taking? Authorizing Provider  amLODipine (NORVASC) 10 MG tablet Take 1 tablet (10 mg total) by mouth daily. 04/03/15  Yes Jack Eng Tysinger, PA-C  aspirin  EC 81 MG EC tablet Take 1 tablet (81 mg total) by mouth daily. 08/30/13  Yes Jack Anchors Love, PA-C  Cholecalciferol (VITAMIN D3) 3000 UNITS TABS Take by mouth.   Yes Historical Provider, MD  MAGNESIUM PO Take by mouth daily.   Yes Historical Provider, MD  acetaminophen (TYLENOL) 325 MG tablet Take 1-2 tablets (325-650 mg total) by mouth every 4 (four) hours as needed for mild pain. Patient not taking: Reported on 07/03/2016 08/30/13   Jack Anchors Love, PA-C  hydrochlorothiazide (HYDRODIURIL) 25 MG tablet TAKE 1 TABLET BY MOUTH  DAILY 05/06/16   Jack Eng Tysinger, PA-C  pravastatin (PRAVACHOL) 20 MG tablet Take 1 tablet (20 mg total) by mouth daily. 04/03/15   Carlena Hurl, PA-C    Physical Exam:   Constitutional: Elderly male Vitals:   07/03/16 1915 07/03/16 1930 07/03/16 1940 07/03/16 2037  BP: 178/88  162/69 (!) 176/82  Pulse:   81   Resp:   26   Temp:  98.7 F (37.1 C)  98.8 F (37.1 C)  TempSrc:  Oral  Oral  SpO2:   97% 98%  Weight:       Eyes: PERRL, lids and conjunctivae normal ENMT: Mucous membranes are moist. Posterior pharynx clear of any exudate or lesions.Normal dentition.  Neck: normal, supple, no masses, no thyromegaly Respiratory: clear to auscultation bilaterally, no wheezing, no crackles. Normal respiratory effort. No accessory muscle use.  Cardiovascular: Irregular irregular, no murmurs / rubs / gallops. No extremity edema. 2+ pedal pulses. No carotid bruits.  Abdomen: no tenderness, no masses palpated. No hepatosplenomegaly. Bowel sounds positive.  Musculoskeletal: no clubbing / cyanosis. No joint deformity upper and lower extremities. Good ROM, no contractures. Normal muscle tone.  Skin: no rashes, lesions, ulcers. No induration Neurologic: CN 2-12 grossly intact. Sensation intact, DTR normal. Strength 5/5 in all 4.  Psychiatric: Normal judgment and insight. Alert and oriented x 3. Normal mood.     Labs on Admission: I have personally reviewed following labs and  imaging studies  CBC:  Recent Labs Lab 07/03/16 1400  WBC 8.0  NEUTROABS 5.8  HGB 12.9*  HCT 38.9*  MCV 89.2  PLT [id number]   Basic Metabolic Panel:  Recent Labs Lab 07/03/16 1400  NA 138  K 3.7  CL 102  CO2 28  GLUCOSE 100*  BUN 14  CREATININE 0.82  CALCIUM 9.1   GFR: CrCl cannot be calculated (Unknown ideal weight.). Liver Function Tests: No results for input(s): AST, ALT, ALKPHOS, BILITOT, PROT, ALBUMIN in the last 168 hours. No results for input(s): LIPASE, AMYLASE in the last 168 hours. No results for input(s): AMMONIA in the last 168 hours. Coagulation Profile: No results for input(s): INR, PROTIME in the last 168 hours. Cardiac Enzymes: No results for input(s): CKTOTAL, CKMB, CKMBINDEX, TROPONINI in the last 168 hours. BNP (last 3 results) No results for input(s): PROBNP  in the last 8760 hours. HbA1C: No results for input(s): HGBA1C in the last 72 hours. CBG: No results for input(s): GLUCAP in the last 168 hours. Lipid Profile: No results for input(s): CHOL, HDL, LDLCALC, TRIG, CHOLHDL, LDLDIRECT in the last 72 hours. Thyroid Function Tests: No results for input(s): TSH, T4TOTAL, FREET4, T3FREE, THYROIDAB in the last 72 hours. Anemia Panel: No results for input(s): VITAMINB12, FOLATE, FERRITIN, TIBC, IRON, RETICCTPCT in the last 72 hours. Urine analysis:    Component Value Date/Time   COLORURINE YELLOW 07/03/2016 1400   APPEARANCEUR CLEAR 07/03/2016 1400   LABSPEC 1.006 07/03/2016 1400   PHURINE 8.0 07/03/2016 1400   GLUCOSEU NEGATIVE 07/03/2016 1400   HGBUR NEGATIVE 07/03/2016 1400   BILIRUBINUR NEGATIVE 07/03/2016 1400   BILIRUBINUR n 04/02/2015 1500   KETONESUR NEGATIVE 07/03/2016 1400   PROTEINUR NEGATIVE 07/03/2016 1400   UROBILINOGEN negative 04/02/2015 1500   UROBILINOGEN 0.2 08/02/2013 1206   NITRITE NEGATIVE 07/03/2016 1400   LEUKOCYTESUR NEGATIVE 07/03/2016 1400   Sepsis Labs: No results found for this or any previous visit (from the  past 240 hour(s)).   Radiological Exams on Admission: Ct Head Wo Contrast  Result Date: 07/03/2016 CLINICAL DATA:  RIGHT-side weakness for several days worse since this morning, history hypertension, stroke, atrial fibrillation, PFO EXAM: CT HEAD WITHOUT CONTRAST TECHNIQUE: Contiguous axial images were obtained from the base of the skull through the vertex without intravenous contrast. COMPARISON:  08/17/2013 FINDINGS: Brain: New generalized atrophy. Normal ventricular morphology. No midline shift or mass effect. Small vessel chronic ischemic changes of deep cerebral white matter. Small old lacunar infarcts at the thalami. Question small old lacunar infarct at the RIGHT cerebellar hemisphere. Old RIGHT parieto-occipital infarct. No intracranial hemorrhage, mass lesion or evidence of acute infarction. No extra-axial fluid collections. Vascular: Scattered atherosclerotic calcifications Skull: Intact Sinuses/Orbits: Cleared Other: N/A IMPRESSION: Atrophy with small vessel chronic ischemic changes of deep cerebral white matter. Multiple old infarcts as above. No acute intracranial abnormalities. Electronically Signed   By: Lavonia Dana M.D.   On: 07/03/2016 15:45    EKG: Independently reviewed. Atrial fibrillation at a rate of 89 bpm  Assessment/Plan Right-sided weakness: Acute. Patient reports acute onset of right-sided weakness following breakfast. Initial CT scan showed generalized atrophy. Patient was evaluated by neurology who recommended completion stroke workup. - Admit to telemetry bed - TIA/stroke order set and initiated - Neuro checks - Appreciate neurology consultation services, ordered recommendations as seen below: - Check lipid panel, and hemoglobin A1c in a.m. - Check MRI/MRA of brain without contrast - Check carotid Dopplers - Check echocardiogram - PT/OT/speech - Continue aspirin  Atrial fibrillation: Chronic. Rate controlled.  Chadsvasc score= 5 Patient with a previous history  of ICH which may have caused him not to be a anticoagulant candidate. - Follow-up with cardiology for further recommendations. - Otherwise continue aspirin  History of CVA with left-sided residual weakness  Status post mitral repair  Essential hypertension - Allow for permissive hypertension  - Restart amlodipine in am  DVT prophylaxis: lovenox Code Status: Full Family Communication: Discussed plan of care with the patient and his wife  Disposition Plan: TBD Consults called: Neurology Admission status: Observation  Norval Morton MD [facility] Pager [phone]  If 7PM-7AM, please contact night-coverage [email or web address] Password [email or web address]  07/03/2016, 10:30 PM

## 2016-07-03 NOTE — ED Provider Notes (Signed)
[place] DEPT [facility] Provider Note   CSN: [id number] Arrival date & time: 07/03/16  1204     History   Chief Complaint Chief Complaint  Patient presents with  . difficulty walking    HPI Jack [name] is a 82 y.o. male with history of CVA with left-sided hemiparesis, atrial fibrillation who presents with right-sided weakness with ambulation that began this morning. Patient's wife also reports that the patient has been leading to the left side over the past few days, however patient's weakness became noticeable today. No change in vision or speech. Patient denies any pain. Patient denies any chest pain, shortness of breath, abdominal pain, nausea, vomiting, new urinary symptoms. Patient has urinary frequency at baseline.Patient reports taking 2, 325 mg aspirin this morning.  HPI  Past Medical History:  Diagnosis Date  . Atrial fibrillation ([place])    Dr. Tollie Eth  . BPH (benign prostatic hypertrophy) 2012   prior therapy with finasteride, but as of 03/2015 no c/o; Dr. Luanne Bras  . Cellulitis of face 11/08   [facility]  . CVA (cerebral infarction) 2009, 2015  . Echocardiogram abnormal 02/11/2010   LVEF 70%, severe mitral regurgitation, pulm HTN   . ED (erectile dysfunction)    Viagra prn, consult with Urology 2012  . Elevated PSA 2012   BPH; Dr. Luanne Bras, [facility]; no more PSAs needed as of 2012 consult  . Gait abnormality    uses cane, s/p CVA  . Hemiparesis affecting left side as late effect of cerebrovascular accident ([place])   . Hiatal hernia    per 2008 EUS  . Hyperlipidemia   . Hypertension   . Microscopic hematuria    chronic, eval 2012 with Urology, benign at that time  . Obstructive uropathy 2012   Dr. Luanne Bras  . Pancreatic mass 01/2006   inconsequential per CT 2007  . 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 Patent foramen ovale    hx/o, notation of fistula on echocardiogram from 2009    . Prophylactic antibiotic 2016   requires due to valve disease  . Schatzki's ring 2008   per endoscopic ultrasound; Dr. Janetta Hora. [name]  . Stroke ([place])   . Ventral hernia    surgery consult 01/2010 with Dr. Excell Seltzer, elective repair if desired , but he declined  . Wears hearing aid     Patient Active Problem List   Diagnosis Date Noted  . TIA (transient ischemic attack) 07/03/2016  . Microscopic hematuria 04/03/2015  . Hyperlipidemia 04/03/2015  . Wears hearing aid 04/03/2015  . Changing skin lesion 04/03/2015  . Paresthesia and pain of left extremity 04/02/2015  . Encounter for health maintenance examination in adult 04/02/2015  . Vaccine counseling 04/02/2015  . Hernia of abdominal cavity 04/02/2015  . Impaired gait and mobility 04/02/2015  . Leg swelling 03/03/2015  . Essential hypertension 03/03/2015  . Spastic hemiplegia affecting nondominant side ([place]) 12/14/2013  . ICH (intracerebral hemorrhage) ([place]) 08/02/2013  . Hx of mitral valve repair   . History of stroke   . History of atrial fibrillation   . Cyst and pseudocyst of pancreas 09/13/2007  . PVC (premature ventricular contraction)   . History of renal cyst   . BPH (benign prostatic hypertrophy)     Past Surgical History:  Procedure Laterality Date  . CARDIAC CATHETERIZATION  02/2010   LVEF 70%, severe mitral regurgitation, pulm HTN; prior in 01/2006  . INGUINAL HERNIA REPAIR  bilat  . MITRAL VALVE REPAIR  01/2006   mitral repair with Maze procedure, tricuspid annuloplasty, Dr. Roxy Manns       Home Medications    Prior to Admission medications   Medication Sig Start Date End Date Taking? Authorizing Provider  acetaminophen (TYLENOL) 325 MG tablet Take 1-2 tablets (325-650 mg total) by mouth every 4 (four) hours as needed for mild pain. Patient not taking: Reported on 04/02/2015 08/30/13   Ivan Anchors Love, PA-C  amLODipine (NORVASC) 10 MG tablet Take 1 tablet (10 mg total) by mouth  daily. 04/03/15   Carlena Hurl, PA-C  aspirin EC 81 MG EC tablet Take 1 tablet (81 mg total) by mouth daily. 08/30/13   Bary Leriche, PA-C  Cholecalciferol (VITAMIN D3) 3000 UNITS TABS Take by mouth.    Historical Provider, MD  hydrochlorothiazide (HYDRODIURIL) 25 MG tablet TAKE 1 TABLET BY MOUTH  DAILY 05/06/16   Camelia Eng Tysinger, PA-C  MAGNESIUM PO Take by mouth daily.    Historical Provider, MD  pravastatin (PRAVACHOL) 20 MG tablet Take 1 tablet (20 mg total) by mouth daily. 04/03/15   Carlena Hurl, PA-C    Family History Family History  Problem Relation Age of Onset  . Family history unknown: Yes    Social History Social History  Substance Use Topics  . Smoking status: Never Smoker  . Smokeless tobacco: Never Used  . Alcohol use No     Allergies   Valsartan   Review of Systems Review of Systems  Constitutional: Negative for chills and fever.  HENT: Negative for facial swelling and sore throat.   Respiratory: Negative for shortness of breath.   Cardiovascular: Negative for chest pain.  Gastrointestinal: Negative for abdominal pain, nausea and vomiting.  Genitourinary: Negative for dysuria.  Musculoskeletal: Negative for back pain.  Skin: Negative for rash and wound.  Neurological: Positive for weakness. Negative for numbness and headaches.  Psychiatric/Behavioral: The patient is not nervous/anxious.      Physical Exam Updated Vital Signs BP 159/73   Pulse 79   Temp 98.4 F (36.9 C) (Oral)   Resp 26   Wt 66.9 kg   SpO2 98%   BMI 26.98 kg/m   Physical Exam  Constitutional: He appears well-developed and well-nourished. No distress.  HENT:  Head: Normocephalic and atraumatic.  Mouth/Throat: Oropharynx is clear and moist. No oropharyngeal exudate.  Eyes: Conjunctivae are normal. Pupils are equal, round, and reactive to light. Right eye exhibits no discharge. Left eye exhibits no discharge. No scleral icterus.  Neck: Normal range of motion. Neck supple.  No thyromegaly present.  Cardiovascular: Normal rate, regular rhythm, normal heart sounds and intact distal pulses.  Exam reveals no gallop and no friction rub.   No murmur heard. Pulmonary/Chest: Effort normal and breath sounds normal. No stridor. No respiratory distress. He has no wheezes. He has no rales.  Abdominal: Soft. Bowel sounds are normal. He exhibits no distension. There is no tenderness. There is no rebound and no guarding.  Musculoskeletal: He exhibits no edema.  Lymphadenopathy:    He has no cervical adenopathy.  Neurological: He is alert. Coordination normal.  CN 3-12 intact; normal sensation throughout; 5/5 strength in all 4 extremities; equal bilateral grip strength; some ataxia with right hand on finger to nose; normal heel-to-shin test  Skin: Skin is warm and dry. No rash noted. He is not diaphoretic. No pallor.  Psychiatric: He has a normal mood and affect.  Nursing note and vitals reviewed.  ED Treatments / Results  Labs (all labs ordered are listed, but only abnormal results are displayed) Labs Reviewed  CBC WITH DIFFERENTIAL/PLATELET - Abnormal; Notable for the following:       Result Value   Hemoglobin 12.9 (*)    HCT 38.9 (*)    All other components within normal limits  BASIC METABOLIC PANEL - Abnormal; Notable for the following:    Glucose, Bld 100 (*)    All other components within normal limits  URINALYSIS, ROUTINE W REFLEX MICROSCOPIC    EKG  EKG Interpretation  Date/Time:  Saturday July 03 2016 12:25:45 EST Ventricular Rate:  89 PR Interval:    QRS Duration: 92 QT Interval:  357 QTC Calculation: 435 R Axis:   -26 Text Interpretation:  Atrial fibrillation Prolonged PR interval Borderline left axis deviation Confirmed by KNOTT MD, DANIEL ([id number]) on 07/03/2016 3:01:45 PM       Radiology Ct Head Wo Contrast  Result Date: 07/03/2016 CLINICAL DATA:  RIGHT-side weakness for several days worse since this morning, history hypertension,  stroke, atrial fibrillation, PFO EXAM: CT HEAD WITHOUT CONTRAST TECHNIQUE: Contiguous axial images were obtained from the base of the skull through the vertex without intravenous contrast. COMPARISON:  08/17/2013 FINDINGS: Brain: New generalized atrophy. Normal ventricular morphology. No midline shift or mass effect. Small vessel chronic ischemic changes of deep cerebral white matter. Small old lacunar infarcts at the thalami. Question small old lacunar infarct at the RIGHT cerebellar hemisphere. Old RIGHT parieto-occipital infarct. No intracranial hemorrhage, mass lesion or evidence of acute infarction. No extra-axial fluid collections. Vascular: Scattered atherosclerotic calcifications Skull: Intact Sinuses/Orbits: Cleared Other: N/A IMPRESSION: Atrophy with small vessel chronic ischemic changes of deep cerebral white matter. Multiple old infarcts as above. No acute intracranial abnormalities. Electronically Signed   By: Lavonia Dana M.D.   On: 07/03/2016 15:45    Procedures Procedures (including critical care time)  Medications Ordered in ED Medications  aspirin EC tablet 325 mg (not administered)     Initial Impression / Assessment and Plan / ED Course  I have reviewed the triage vital signs and the nursing notes.  Pertinent labs & imaging results that were available during my care of the patient were reviewed by me and considered in my medical decision making (see chart for details).  Clinical Course     Concern for new cervical or stroke. CBC shows an hemoglobin 12.9. BMP unremarkable. UA negative. EKG shows persistent atrial fibrillation. Patient is not anticoagulated. CT head shows atrophy with small vessel chronic ischemic changes of deep cerebral white matter; multiple old infarcts, but no acute intracranial abnormalities. I discussed patient case with Dr. Leonel Ramsay, neurologist, who requested patient be admitted for further workup including MRI. I consulted Dr. Posey Pronto, with [facility], who will admit the patient for further evaluation and treatment. Patient and wife understand and agree with plan. Patient vitals stable throughout ED course. I believe patient is safe to be transferred to [facility] for admission.  Final Clinical Impressions(s) / ED Diagnoses   Final diagnoses:  Right sided weakness    New Prescriptions New Prescriptions   No medications on file      Caryl Ada 07/03/16 1713    Leo Grosser, MD 07/04/16 [phone]

## 2016-07-03 NOTE — Consult Note (Signed)
Admission H&P    Chief Complaint: New onset right-sided weakness.  HPI: Jack [name] is an 82 y.o. male with a history of stroke 2 years ago with residual left hemiparesthesias, hypertension, hyperlipidemia and atrial fibrillation not on anticoagulation, seen in the ED at [facility] with a complaint of new onset right-sided weakness. Deficit was present when he woke up this morning. He was last known well at 10:00 last night. His wife noted difficulty with ambulation with coordination of his right lower extremity as well as coordination of his right upper extremity using his cane. CT scan of his head showed no acute changes. MRI is pending. NIH stroke score at the time of this evaluation was 2.  LSN: 10:00 PM on 05/02/2016 tPA Given: No: Beyond time under for treatment consideration when he arrived in the ED mRankin:  Past Medical History:  Diagnosis Date  . Atrial fibrillation ([place])    Dr. Tollie Eth  . BPH (benign prostatic hypertrophy) 2012   prior therapy with finasteride, but as of 03/2015 no c/o; Dr. Luanne Bras  . Cellulitis of face 11/08   [facility]  . CVA (cerebral infarction) 2009, 2015  . Echocardiogram abnormal 02/11/2010   LVEF 70%, severe mitral regurgitation, pulm HTN   . ED (erectile dysfunction)    Viagra prn, consult with Urology 2012  . Elevated PSA 2012   BPH; Dr. Luanne Bras, [facility]; no more PSAs needed as of 2012 consult  . Gait abnormality    uses cane, s/p CVA  . Hemiparesis affecting left side as late effect of cerebrovascular accident ([place])   . Hiatal hernia    per 2008 EUS  . Hyperlipidemia   . Hypertension   . Microscopic hematuria    chronic, eval 2012 with Urology, benign at that time  . Obstructive uropathy 2012   Dr. Luanne Bras  . Pancreatic mass 01/2006   inconsequential per CT 2007  . 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 Patent foramen ovale    hx/o, notation of fistula on  echocardiogram from 2009  . Prophylactic antibiotic 2016   requires due to valve disease  . Schatzki's ring 2008   per endoscopic ultrasound; Dr. Janetta Hora. [name]  . Stroke ([place])   . Ventral hernia    surgery consult 01/2010 with Dr. Excell Seltzer, elective repair if desired , but he declined  . Wears hearing aid     Past Surgical History:  Procedure Laterality Date  . CARDIAC CATHETERIZATION  02/2010   LVEF 70%, severe mitral regurgitation, pulm HTN; prior in 01/2006  . INGUINAL HERNIA REPAIR     bilat  . MITRAL VALVE REPAIR  01/2006   mitral repair with Maze procedure, tricuspid annuloplasty, Dr. Roxy Manns    Family History  Problem Relation Age of Onset  . Family history unknown: Yes   Social History:  reports that he has never smoked. He has never used smokeless tobacco. He reports that he does not drink alcohol or use drugs.  Allergies:  Allergies  Allergen Reactions  . Valsartan Swelling    angioedema    Medications Prior to Admission  Medication Sig Dispense Refill  . amLODipine (NORVASC) 10 MG tablet Take 1 tablet (10 mg total) by mouth daily. 90 tablet 3  . aspirin EC 81 MG EC tablet Take 1 tablet (81 mg total) by mouth daily. 90 tablet 1  . Cholecalciferol (VITAMIN D3) 3000 UNITS TABS Take by mouth.    Marland Kitchen  MAGNESIUM PO Take by mouth daily.    Marland Kitchen acetaminophen (TYLENOL) 325 MG tablet Take 1-2 tablets (325-650 mg total) by mouth every 4 (four) hours as needed for mild pain. (Patient not taking: Reported on 07/03/2016)    . hydrochlorothiazide (HYDRODIURIL) 25 MG tablet TAKE 1 TABLET BY MOUTH  DAILY 90 tablet 0  . pravastatin (PRAVACHOL) 20 MG tablet Take 1 tablet (20 mg total) by mouth daily. 90 tablet 1    ROS: History obtained from spouse  General ROS: negative for - chills, fatigue, fever, night sweats, weight gain or weight loss Psychological ROS: negative for - behavioral disorder, hallucinations, memory difficulties, mood swings or suicidal  ideation Ophthalmic ROS: negative for - blurry vision, double vision, eye pain or loss of vision ENT ROS: negative for - epistaxis, nasal discharge, oral lesions, sore throat, tinnitus or vertigo Allergy and Immunology ROS: negative for - hives or itchy/watery eyes Hematological and Lymphatic ROS: negative for - bleeding problems, bruising or swollen lymph nodes Endocrine ROS: negative for - galactorrhea, hair pattern changes, polydipsia/polyuria or temperature intolerance Respiratory ROS: negative for - cough, hemoptysis, shortness of breath or wheezing Cardiovascular ROS: negative for - chest pain, dyspnea on exertion, edema or irregular heartbeat Gastrointestinal ROS: negative for - abdominal pain, diarrhea, hematemesis, nausea/vomiting or stool incontinence Genito-Urinary ROS: negative for - dysuria, hematuria, incontinence or urinary frequency/urgency Musculoskeletal ROS: negative for - joint swelling or muscular weakness Neurological ROS: as noted in HPI Dermatological ROS: negative for rash and skin lesion changes  Physical Examination: Blood pressure (!) 176/82, pulse 81, temperature 98.8 F (37.1 C), temperature source Oral, resp. rate 26, weight 66.9 kg (147 lb 8 oz), SpO2 98 %.  HEENT-  Normocephalic, no lesions, without obvious abnormality.  Normal external eye and conjunctiva.  Normal TM's bilaterally.  Normal auditory canals and external ears. Normal external nose, mucus membranes and septum.  Normal pharynx. Neck supple with no masses, nodes, nodules or enlargement. Cardiovascular - regularly irregular rhythm, S1, S2 normal and no S3 or S4 Lungs - chest clear, no wheezing, rales, normal symmetric air entry Abdomen - soft, non-tender; bowel sounds normal; no masses,  no organomegaly Extremities - no joint deformities, effusion, or inflammation  Neurologic Examination: Mental Status: Alert, oriented, no acute distress.  Speech fluent without evidence of aphasia. Able to follow  commands without difficulty. Cranial Nerves: II-Visual fields were normal. III/IV/VI-Pupils were equal and reacted normally to light. Extraocular movements were full and conjugate. V/VII-no facial numbness and no facial weakness. VIII-moderately severe bilateral hearing loss. X-normal speech. XI: trapezius strength/neck flexion strength normal bilaterally XII-midline tongue extension with normal strength. Motor: No drift of right or left extremities; normal muscle tone throughout. Sensory: Normal throughout. Deep Tendon Reflexes: 1+ and symmetric. Plantars: Flexor bilaterally Cerebellar: Impaired coordination of upper extremities with finger-nose testing, right greater than left. Carotid auscultation: Normal  Results for orders placed or performed during the hospital encounter of 07/03/16 (from the past 48 hour(s))  CBC with Differential     Status: Abnormal   Collection Time: 07/03/16  2:00 PM  Result Value Ref Range   WBC 8.0 4.0 - 10.5 K/uL   RBC 4.36 4.22 - 5.81 MIL/uL   Hemoglobin 12.9 (L) 13.0 - 17.0 g/dL   HCT 38.9 (L) 39.0 - 52.0 %   MCV 89.2 78.0 - 100.0 fL   MCH 29.6 26.0 - 34.0 pg   MCHC 33.2 30.0 - 36.0 g/dL   RDW 14.3 11.5 - 15.5 %   Platelets 295  150 - 400 K/uL   Neutrophils Relative % 73 %   Neutro Abs 5.8 1.7 - 7.7 K/uL   Lymphocytes Relative 16 %   Lymphs Abs 1.3 0.7 - 4.0 K/uL   Monocytes Relative 8 %   Monocytes Absolute 0.7 0.1 - 1.0 K/uL   Eosinophils Relative 2 %   Eosinophils Absolute 0.2 0.0 - 0.7 K/uL   Basophils Relative 1 %   Basophils Absolute 0.0 0.0 - 0.1 K/uL  Basic metabolic panel     Status: Abnormal   Collection Time: 07/03/16  2:00 PM  Result Value Ref Range   Sodium 138 135 - 145 mmol/L   Potassium 3.7 3.5 - 5.1 mmol/L   Chloride 102 101 - 111 mmol/L   CO2 28 22 - 32 mmol/L   Glucose, Bld 100 (H) 65 - 99 mg/dL   BUN 14 6 - 20 mg/dL   Creatinine, Ser 0.82 0.61 - 1.24 mg/dL   Calcium 9.1 8.9 - 10.3 mg/dL   GFR calc non Af Amer >60  >60 mL/min   GFR calc Af Amer >60 >60 mL/min    Comment: (NOTE) The eGFR has been calculated using the CKD EPI equation. This calculation has not been validated in all clinical situations. eGFR's persistently <60 mL/min signify possible Chronic Kidney Disease.    Anion gap 8 5 - 15  Urinalysis, Routine w reflex microscopic     Status: None   Collection Time: 07/03/16  2:00 PM  Result Value Ref Range   Color, Urine YELLOW YELLOW   APPearance CLEAR CLEAR   Specific Gravity, Urine 1.006 1.005 - 1.030   pH 8.0 5.0 - 8.0   Glucose, UA NEGATIVE NEGATIVE mg/dL   Hgb urine dipstick NEGATIVE NEGATIVE   Bilirubin Urine NEGATIVE NEGATIVE   Ketones, ur NEGATIVE NEGATIVE mg/dL   Protein, ur NEGATIVE NEGATIVE mg/dL   Nitrite NEGATIVE NEGATIVE   Leukocytes, UA NEGATIVE NEGATIVE    Comment: Microscopic not done on urines with negative protein, blood, leukocytes, nitrite, or glucose < 500 mg/dL.   Ct Head Wo Contrast  Result Date: 07/03/2016 CLINICAL DATA:  RIGHT-side weakness for several days worse since this morning, history hypertension, stroke, atrial fibrillation, PFO EXAM: CT HEAD WITHOUT CONTRAST TECHNIQUE: Contiguous axial images were obtained from the base of the skull through the vertex without intravenous contrast. COMPARISON:  08/17/2013 FINDINGS: Brain: New generalized atrophy. Normal ventricular morphology. No midline shift or mass effect. Small vessel chronic ischemic changes of deep cerebral white matter. Small old lacunar infarcts at the thalami. Question small old lacunar infarct at the RIGHT cerebellar hemisphere. Old RIGHT parieto-occipital infarct. No intracranial hemorrhage, mass lesion or evidence of acute infarction. No extra-axial fluid collections. Vascular: Scattered atherosclerotic calcifications Skull: Intact Sinuses/Orbits: Cleared Other: N/A IMPRESSION: Atrophy with small vessel chronic ischemic changes of deep cerebral white matter. Multiple old infarcts as above. No acute  intracranial abnormalities. Electronically Signed   By: Lavonia Dana M.D.   On: 07/03/2016 15:45    Assessment: 82 y.o. male with multiple risk factors for stroke as well as previous cerebral infarction ring with probable left ischemic cerebral infarction.  Stroke Risk Factors - atrial fibrillation, hyperlipidemia and hypertension  Plan: 1. HgbA1c, fasting lipid panel 2. MRI, MRA  of the brain without contrast 3. PT consult, OT consult, Speech consult 4. Echocardiogram 5. Carotid dopplers 6. Prophylactic therapy-Antiplatelet med: Aspirin  7. Risk factor modification 8. Telemetry monitoring  C.R. Nicole Kindred, MD [facility] [phone]  07/03/2016, 10:03 PM

## 2016-07-03 NOTE — ED Triage Notes (Signed)
Difficulty walking for a few days per wife. She advises he has had trouble lifting his feet. Hx of CVA

## 2016-07-04 ENCOUNTER — Observation Stay (HOSPITAL_COMMUNITY): Payer: Medicare Other

## 2016-07-04 ENCOUNTER — Observation Stay (HOSPITAL_BASED_OUTPATIENT_CLINIC_OR_DEPARTMENT_OTHER): Payer: Medicare Other

## 2016-07-04 DIAGNOSIS — I639 Cerebral infarction, unspecified: Secondary | ICD-10-CM | POA: Diagnosis not present

## 2016-07-04 DIAGNOSIS — I63412 Cerebral infarction due to embolism of left middle cerebral artery: Secondary | ICD-10-CM | POA: Diagnosis not present

## 2016-07-04 DIAGNOSIS — R531 Weakness: Secondary | ICD-10-CM | POA: Diagnosis not present

## 2016-07-04 LAB — CBC
HEMATOCRIT: 37 % — AB (ref 39.0–52.0)
HEMOGLOBIN: 12.1 g/dL — AB (ref 13.0–17.0)
MCH: 29 pg (ref 26.0–34.0)
MCHC: 32.7 g/dL (ref 30.0–36.0)
MCV: 88.7 fL (ref 78.0–100.0)
Platelets: 278 10*3/uL (ref 150–400)
RBC: 4.17 MIL/uL — ABNORMAL LOW (ref 4.22–5.81)
RDW: 14.8 % (ref 11.5–15.5)
WBC: 9 10*3/uL (ref 4.0–10.5)

## 2016-07-04 LAB — LIPID PANEL
CHOLESTEROL: 128 mg/dL (ref 0–200)
HDL: 29 mg/dL — ABNORMAL LOW (ref >40)
LDL Cholesterol: 84 mg/dL (ref 0–99)
TRIGLYCERIDES: 74 mg/dL (ref <150)
Total CHOL/HDL Ratio: 4.4 RATIO
VLDL: 15 mg/dL (ref 0–40)

## 2016-07-04 LAB — BASIC METABOLIC PANEL
Anion gap: 7 (ref 5–15)
BUN: 10 mg/dL (ref 6–20)
CHLORIDE: 106 mmol/L (ref 101–111)
CO2: 26 mmol/L (ref 22–32)
Calcium: 8.7 mg/dL — ABNORMAL LOW (ref 8.9–10.3)
Creatinine, Ser: 0.78 mg/dL (ref 0.61–1.24)
Glucose, Bld: 83 mg/dL (ref 65–99)
POTASSIUM: 3.4 mmol/L — AB (ref 3.5–5.1)
SODIUM: 139 mmol/L (ref 135–145)

## 2016-07-04 LAB — VAS US CAROTID
LEFT ECA DIAS: -15 cm/s
LEFT VERTEBRAL DIAS: 17 cm/s
Left CCA dist dias: 12 cm/s
Left CCA dist sys: 56 cm/s
Left CCA prox dias: -25 cm/s
Left CCA prox sys: -105 cm/s
Left ICA dist dias: -24 cm/s
Left ICA dist sys: -77 cm/s
Left ICA prox dias: 12 cm/s
Left ICA prox sys: 34 cm/s
RCCADSYS: -76 cm/s
RCCAPDIAS: -15 cm/s
RIGHT ECA DIAS: -16 cm/s
RIGHT VERTEBRAL DIAS: -8 cm/s
Right CCA prox sys: -100 cm/s

## 2016-07-04 LAB — MAGNESIUM: MAGNESIUM: 1.9 mg/dL (ref 1.7–2.4)

## 2016-07-04 MED ORDER — DIPHENHYDRAMINE HCL 25 MG PO CAPS
25.0000 mg | ORAL_CAPSULE | Freq: Once | ORAL | Status: DC | PRN
  Filled 2016-07-04: qty 1

## 2016-07-04 MED ORDER — POTASSIUM CHLORIDE CRYS ER 20 MEQ PO TBCR
20.0000 meq | EXTENDED_RELEASE_TABLET | ORAL | Status: AC
  Administered 2016-07-04: 20 meq via ORAL
  Filled 2016-07-04: qty 1

## 2016-07-04 MED ORDER — PRAVASTATIN SODIUM 40 MG PO TABS
40.0000 mg | ORAL_TABLET | Freq: Every day | ORAL | Status: DC
  Administered 2016-07-04 – 2016-07-08 (×5): 40 mg via ORAL
  Filled 2016-07-04 (×5): qty 1

## 2016-07-04 NOTE — Progress Notes (Signed)
VASCULAR LAB PRELIMINARY  PRELIMINARY  PRELIMINARY  PRELIMINARY  Carotid duplex completed.    Preliminary report:  1-39% ICA plaquing. Vertebral artery flow is antegrade.   [employer] Null, RVT 07/04/2016, 11:23 AM

## 2016-07-04 NOTE — Progress Notes (Signed)
*  PRELIMINARY RESULTS* Vascular Ultrasound Lower extremity venous duplex has been completed.  Preliminary findings: No evidence of DVT or baker's cyst.  Landry Mellow, RDMS, RVT  07/04/2016, 5:39 PM

## 2016-07-04 NOTE — Progress Notes (Signed)
STROKE TEAM PROGRESS NOTE   HISTORY OF PRESENT ILLNESS (per record) Jack [name] is an 82 y.o. male with a history of stroke 2 years ago with residual left hemiparesis, hypertension, hyperlipidemia and atrial fibrillation not on anticoagulation, seen in the ED at [facility] with a complaint of new onset right-sided weakness. Deficit was present when he woke up this morning. He was last known well at 10:00 last night. His wife noted difficulty with ambulation with coordination of his right lower extremity as well as coordination of his right upper extremity using his cane. CT scan of his head showed no acute changes. MRI is pending. NIH stroke score at the time of this evaluation was 2.  LSN: 10:00 PM on 05/02/2016 tPA Given: No: Beyond time under for treatment consideration when he arrived in the ED mRankin:   SUBJECTIVE (INTERVAL HISTORY) He is a poor historian and has poor hearing however he feels improved. No family is at bedside. Patient with new embolic strokes and afib, recommending anti-coagulation DOAC such as Eliquis. Discussed with his attending Dr. Maylene Roes, she will call his cardiologist and discuss with patient and family.     OBJECTIVE Temp:  [98.6 F (37 C)-99.2 F (37.3 C)] 98.8 F (37.1 C) (12/23 2037) Pulse Rate:  [74-84] 74 (12/24 0700) Cardiac Rhythm: Atrial fibrillation (12/24 0700) Resp:  [15-26] 18 (12/24 0700) BP: (151-180)/(67-97) 164/74 (12/24 0700) SpO2:  [94 %-98 %] 96 % (12/24 0700)  CBC:  Recent Labs Lab 07/03/16 1400 07/04/16 0220  WBC 8.0 9.0  NEUTROABS 5.8  --   HGB 12.9* 12.1*  HCT 38.9* 37.0*  MCV 89.2 88.7  PLT 295 0000000    Basic Metabolic Panel:  Recent Labs Lab 07/03/16 1400 07/04/16 0220  NA 138 139  K 3.7 3.4*  CL 102 106  CO2 28 26  GLUCOSE 100* 83  BUN 14 10  CREATININE 0.82 0.78  CALCIUM 9.1 8.7*  MG  --  1.9    Lipid Panel:    Component Value Date/Time   CHOL 128 07/04/2016 0220   TRIG 74 07/04/2016 0220   HDL 29 (L)  07/04/2016 0220   CHOLHDL 4.4 07/04/2016 0220   VLDL 15 07/04/2016 0220   LDLCALC 84 07/04/2016 0220   HgbA1c:  Lab Results  Component Value Date   HGBA1C  12/12/2007    5.5 (NOTE)   The ADA recommends the following therapeutic goals for glycemic   control related to Hgb A1C measurement:   Goal of Therapy:   < 7.0% Hgb A1C   Action Suggested:  > 8.0% Hgb A1C   Ref:  Diabetes Care, 22, Suppl. 1, 1999   Urine Drug Screen:    Component Value Date/Time   LABOPIA NONE DETECTED 08/02/2013 1206   Jack [name] 08/02/2013 1206   LABBENZ NONE DETECTED 08/02/2013 1206   AMPHETMU NONE DETECTED 08/02/2013 1206   THCU NONE DETECTED 08/02/2013 1206   LABBARB NONE DETECTED 08/02/2013 1206      IMAGING  Ct Head Wo Contrast 07/03/2016 Atrophy with small vessel chronic ischemic changes of deep cerebral white matter. Multiple old infarcts as above. No acute intracranial abnormalities.    Mr Jack [name] Head/brain Wo Cm 07/04/2016 1. Incomplete, motion degraded examination as above.  2. Small acute cortical/subcortical infarcts in the high left frontoparietal region.  3. Punctate acute right occipital lobe cortical infarcts.  4. Chronic cerebral and cerebellar infarcts as above.  5. No evidence of major intracranial arterial occlusion or significant proximal stenosis.  PHYSICAL EXAM  HEENT-  Normocephalic, no lesions, without obvious abnormality.  Normal external eye and conjunctiva.  Normal TM's bilaterally.  Normal auditory canals and external ears. Normal external nose, mucus membranes and septum.  Normal pharynx. Neck supple with no masses, nodes, nodules or enlargement. Cardiovascular - regularly irregular rhythm, S1, S2 normal and no S3 or S4 Lungs - chest clear, no wheezing, rales, normal symmetric air entry Abdomen - soft, non-tender; bowel sounds normal; no masses,  no organomegaly Extremities - no joint deformities, effusion, or inflammation  Neurologic  Examination: Mental Status: Alert, oriented to name, date, location, situation, no acute distress.  Speech fluent without evidence of aphasia. Able to follow commands without difficulty. Cranial Nerves: II-Visual fields were normal. III/IV/VI-Pupils were equal and reacted normally to light. Extraocular movements were full and conjugate. V/VII-no facial numbness and no facial weakness. VIII: Significantly impaired hearing X-normal speech. XI: trapezius strength/neck flexion strength normal bilaterally XII-midline tongue extension with normal strength. Motor: proximal LE and UE weakness bilaterally, he has mild left hemiparesis from previous stroke. No Drift of the right arm or right leg.  Sensory: Patient endorses symmetric sensation to light touch Deep Tendon Reflexes:  symmetric. Plantars: Flexor bilaterally Cerebellar: mild dysmetria noted FTN on the left UE but intact in the other limbs Gait: Deferred but can sit on side of the bed independently    ASSESSMENT/PLAN Jack [name] is a 82 y.o. male with history of a stroke 2 years ago with residual left hemiparesis, PFO, hypertension, hyperlipidemia and atrial fibrillation not on anticoagulation  presenting with new onset right-sided weakness. He did not receive IV t-PA due to late presentation.  Multiple Strokes:  Dominant infarct felt to be embolic secondary to atrial fibrillation without anticoagulation.  Resultant resolution of symptoms  MRI - small acute cortical/subcortical infarcts in the high left frontoparietal region. Chronic cerebral and cerebellar infarcts.  MRA - No evidence of major intracranial arterial occlusion or significant proximal stenosis.   Bilateral L E venous duplex - pending  Carotid Doppler - 1-39% ICA plaquing. Vertebral artery flow is antegrade.   2D Echo - pending  LDL - 84  HgbA1c pending  VTE prophylaxis - Lovenox  Diet Heart Room service appropriate? Yes; Fluid consistency:  Thin  aspirin 81 mg daily prior to admission, now on aspirin 325 mg daily  Patient counseled to be compliant with his antithrombotic medications  Ongoing aggressive stroke risk factor management  Atrial fibrillation - recommend anticoagulation (cardiologist - Dr Wynonia Lawman) [place]. Discussed with his attending Dr. Maylene Roes, she will call his cardiologist and discuss with patient and family.  Therapy recommendations:  pending  Disposition: Pending  Hypertension  Stable  Permissive hypertension (OK if < 220/120) but gradually normalize in 5-7 days  Long-term BP goal normotensive  Hyperlipidemia  Home meds:  Pravachol 20 mg daily not resumed in hospital  LDL 84, goal < 70  Increase Pravachol to 40 mg daily  Continue statin at discharge    Other Stroke Risk Factors  Advanced age  Overweight, Body mass index is 26.98 kg/m., recommend weight loss, diet and exercise as appropriate   Hx stroke/TIA  Atrial Fibrillation without anticoagulation.   Other Active Problems  PFO hx -> check lower extremity venous Dopplers  Atrial fibrillation - recommend anticoagulation (cardiologist - Dr Wynonia Lawman) [place]. Discussed with his attending Dr. Maylene Roes, she will call his cardiologist and discuss with patient and family.   Possible previous history of ICH per Dr. Wynonia Lawman notes, patient denies this however  Mild hypokalemia - 3.4 (supplemented)  Hospital day # 0  Patient with embolic strokes and afib, recommending anti-coagulation DOAC such as Eliquis. Discussed with his attending Dr. Maylene Roes, she will call his cardiologist and discuss with patient and family.    Personally examined patient and images, and have participated in and made any corrections needed to history, physical, neuro exam,assessment and plan as stated above.  I have personally obtained the history, evaluated lab date, reviewed imaging studies and agree with radiology interpretations.    Sarina Ill, MD Stroke  Neurology    To contact Stroke Continuity provider, please refer to [email or web address] After hours, contact General Neurology

## 2016-07-04 NOTE — Evaluation (Signed)
Physical Therapy Evaluation Patient Details Name: Jack [name] MRN: [id number] DOB: 02/06/1934 Today's Date: 07/04/2016   History of Present Illness  82 y.o. male with a history of stroke 2 years ago with residual left hemiparesis, hypertension, hyperlipidemia and atrial fibrillation not on anticoagulation, seen in the ED at [facility] with a complaint of new onset right-sided weakness.  MRI revealed small acute cortical/subcortical infarcts in the high left frontoparietal region and punctate acute right occipital lobe cortical infarcts.   Clinical Impression  Pt admitted with above diagnosis. Pt currently with functional limitations due to the deficits listed below (see PT Problem List). See below for eval details. Pt will benefit from skilled PT to increase their independence and safety with mobility to allow discharge to the venue listed below.  Recommend CIR at d/c. Pt is an excellent candidate with good motivation to participate and good rehab potential.      Follow Up Recommendations CIR;Supervision/Assistance - 24 hour    Equipment Recommendations  None recommended by PT    Recommendations for Other Services Rehab consult     Precautions / Restrictions Precautions Precautions: Fall      Mobility  Bed Mobility Overal bed mobility: Needs Assistance Bed Mobility: Supine to Sit;Sit to Supine     Supine to sit: Min assist;HOB elevated Sit to supine: Min guard;HOB elevated   General bed mobility comments: +rail, increased time to complete  Transfers Overall transfer level: Needs assistance Equipment used: Rolling walker (2 wheeled) Transfers: Sit to/from [facility] Sit to Stand: Min assist Stand pivot transfers: Min assist       General transfer comment: verbal cues for hand placement, assist to power up  Ambulation/Gait Ambulation/Gait assistance: Min guard Ambulation Distance (Feet): 100 Feet Assistive device: Rolling walker (2 wheeled) Gait  Pattern/deviations: Decreased stride length;Trunk flexed;Narrow base of support Gait velocity: decreased Gait velocity interpretation: Below normal speed for age/gender General Gait Details: verbal cues for posture, left lateral lean  Stairs            Wheelchair Mobility    Modified Rankin (Stroke Patients Only) Modified Rankin (Stroke Patients Only) Pre-Morbid Rankin Score: Slight disability Modified Rankin: Moderately severe disability     Balance Overall balance assessment: Needs assistance Sitting-balance support: No upper extremity supported;Feet supported Sitting balance-Leahy Scale: Fair Sitting balance - Comments: able to correct with verbal/tactile cueing Postural control: Posterior lean;Left lateral lean Standing balance support: Bilateral upper extremity supported;During functional activity Standing balance-Leahy Scale: Poor                               Pertinent Vitals/Pain Pain Assessment: No/denies pain    Home Living Family/patient expects to be discharged to:: Private residence Living Arrangements: Spouse/significant other Available Help at Discharge: Family Type of Home: House Home Access: Level entry     Home Layout: Multi-level;Bed/bath upstairs;1/2 bath on main level Home Equipment: Cane - quad;Cane - single point;Walker - 2 wheels      Prior Function Level of Independence: Independent with assistive device(s)         Comments: independent without A.D. in house and with [facility] in community. Goes to the gym with his wife 2 x week to walk. Community ambulator.     Hand Dominance   Dominant Hand: Left    Extremity/Trunk Assessment   Upper Extremity Assessment Upper Extremity Assessment: Defer to OT evaluation    Lower Extremity Assessment Lower Extremity Assessment: LLE deficits/detail;RLE deficits/detail  RLE Deficits / Details: grossly 4/5 LLE Deficits / Details: grossly 4-/5 LLE Sensation: decreased  proprioception LLE Coordination: decreased fine motor    Cervical / Trunk Assessment Cervical / Trunk Assessment: Kyphotic  Communication   Communication: HOH  Cognition Arousal/Alertness: Awake/alert Behavior During Therapy: WFL for tasks assessed/performed Overall Cognitive Status: History of cognitive impairments - at baseline                 General Comments: labile     General Comments      Exercises     Assessment/Plan    PT Assessment Patient needs continued PT services  PT Problem List Decreased strength;Decreased activity tolerance;Decreased balance;Decreased knowledge of use of DME;Decreased coordination;Decreased mobility;Decreased safety awareness          PT Treatment Interventions DME instruction;Gait training;Stair training;Functional mobility training;Neuromuscular re-education;Balance training;Therapeutic exercise;Therapeutic activities;Patient/family education    PT Goals (Current goals can be found in the Care Plan section)  Acute Rehab PT Goals Patient Stated Goal: independence PT Goal Formulation: With patient/family Time For Goal Achievement: 07/18/16 Potential to Achieve Goals: Good    Frequency Min 4X/week   Barriers to discharge        Co-evaluation               End of Session Equipment Utilized During Treatment: Gait belt Activity Tolerance: Patient tolerated treatment well Patient left: in bed;with bed alarm set;with call bell/phone within reach;with family/visitor present Nurse Communication: Mobility status    Functional Assessment Tool Used: clinical judgement Functional Limitation: Mobility: Walking and moving around Mobility: Walking and Moving Around Current Status [phone]): At least 20 percent but less than 40 percent impaired, limited or restricted Mobility: Walking and Moving Around Goal Status [phone]): At least 1 percent but less than 20 percent impaired, limited or restricted    Time: [id number] PT Time  Calculation (min) (ACUTE ONLY): 38 min   Charges:   PT Evaluation $PT Eval Moderate Complexity: 1 Procedure PT Treatments $Gait Training: 8-22 mins $Therapeutic Activity: 8-22 mins   PT G Codes:   PT G-Codes **NOT FOR INPATIENT CLASS** Functional Assessment Tool Used: clinical judgement Functional Limitation: Mobility: Walking and moving around Mobility: Walking and Moving Around Current Status [id number]): At least 20 percent but less than 40 percent impaired, limited or restricted Mobility: Walking and Moving Around Goal Status [phone]): At least 1 percent but less than 20 percent impaired, limited or restricted    Lorriane Shire 07/04/2016, 3:04 PM

## 2016-07-04 NOTE — Progress Notes (Signed)
OT Cancellation Note  Patient Details Name: Jack [name] MRN: [id number] DOB: [birth year]   Cancelled Treatment:    Reason Eval/Treat Not Completed: Patient at procedure or test/ unavailable MRI ( pt returning from MRI early due to vomiting at testing)  Vonita Moss   OTR/L Pager: [phone] Office: [phone] .  07/04/2016, 11:59 AM

## 2016-07-04 NOTE — Evaluation (Signed)
Speech Language Pathology Evaluation Patient Details Name: Jack [name] MRN: [id number] DOB: 16-Oct-1933 Today's Date: 07/04/2016 Time: [id number] SLP Time Calculation (min) (ACUTE ONLY): 17 min  Problem List:  Patient Active Problem List   Diagnosis Date Noted  . TIA (transient ischemic attack) 07/03/2016  . Right sided weakness 07/03/2016  . Microscopic hematuria 04/03/2015  . Hyperlipidemia 04/03/2015  . Wears hearing aid 04/03/2015  . Changing skin lesion 04/03/2015  . Paresthesia and pain of left extremity 04/02/2015  . Encounter for health maintenance examination in adult 04/02/2015  . Vaccine counseling 04/02/2015  . Hernia of abdominal cavity 04/02/2015  . Impaired gait and mobility 04/02/2015  . Leg swelling 03/03/2015  . Essential hypertension 03/03/2015  . Spastic hemiplegia affecting nondominant side ([place]) 12/14/2013  . ICH (intracerebral hemorrhage) ([place]) 08/02/2013  . Hx of mitral valve repair   . History of stroke   . History of atrial fibrillation   . Cyst and pseudocyst of pancreas 09/13/2007  . PVC (premature ventricular contraction)   . History of renal cyst   . BPH (benign prostatic hypertrophy)    Past Medical History:  Past Medical History:  Diagnosis Date  . Atrial fibrillation ([place])    Dr. Tollie Eth  . BPH (benign prostatic hypertrophy) 2012   prior therapy with finasteride, but as of 03/2015 no c/o; Dr. Luanne Bras  . Cellulitis of face 11/08   [facility]  . CVA (cerebral infarction) 2009, 2015  . Echocardiogram abnormal 02/11/2010   LVEF 70%, severe mitral regurgitation, pulm HTN   . ED (erectile dysfunction)    Viagra prn, consult with Urology 2012  . Elevated PSA 2012   BPH; Dr. Luanne Bras, [facility]; no more PSAs needed as of 2012 consult  . Gait abnormality    uses cane, s/p CVA  . Hemiparesis affecting left side as late effect of cerebrovascular accident ([place])   . Hiatal hernia    per 2008 EUS  .  Hyperlipidemia   . Hypertension   . Microscopic hematuria    chronic, eval 2012 with Urology, benign at that time  . Obstructive uropathy 2012   Dr. Luanne Bras  . Pancreatic mass 01/2006   inconsequential per CT 2007  . 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 Patent foramen ovale    hx/o, notation of fistula on echocardiogram from 2009  . Prophylactic antibiotic 2016   requires due to valve disease  . Schatzki's ring 2008   per endoscopic ultrasound; Dr. Janetta Hora. Jack [name]  . Stroke ([place])   . Ventral hernia    surgery consult 01/2010 with Dr. Excell Seltzer, elective repair if desired , but he declined  . Wears hearing aid    Past Surgical History:  Past Surgical History:  Procedure Laterality Date  . CARDIAC CATHETERIZATION  02/2010   LVEF 70%, severe mitral regurgitation, pulm HTN; prior in 01/2006  . INGUINAL HERNIA REPAIR     bilat  . MITRAL VALVE REPAIR  01/2006   mitral repair with Maze procedure, tricuspid annuloplasty, Dr. Roxy Manns   HPI:  82 y.o.malewith multiple risk factors for stroke as well as previous cerebral infarctions (2009, Jan 2015 right basal ganglia) brought to ED with probable left ischemic cerebral infarction. Initial CCT showed generalized atrophy.    Assessment / Plan / Recommendation Clinical Impression  Pt presents with fluent expression, follows multistep commands, no anomia, no dysarthria.  Mild baseline deficits in short-term recall, problem-solving.  His wife drives  and handles finances; pt does basic chores/yard work.  No acute changes in cognition or communication identified.  No SLP f/u recommended.      SLP Assessment  Patient does not need any further Speech Lanaguage Pathology Services    Follow Up Recommendations  None    Frequency and Duration           SLP Evaluation Cognition  Overall Cognitive Status: History of cognitive impairments - at baseline Arousal/Alertness:  Awake/alert Orientation Level: Oriented X4 Attention: Focused Focused Attention: Appears intact Memory: Impaired Memory Impairment: Retrieval deficit Awareness: Impaired Awareness Impairment: Emergent impairment       Comprehension  Auditory Comprehension Overall Auditory Comprehension: Appears within functional limits for tasks assessed Visual Recognition/Discrimination Discrimination: Within Function Limits Reading Comprehension Reading Status: Within funtional limits    Expression Expression Primary Mode of Expression: Verbal Verbal Expression Overall Verbal Expression: Appears within functional limits for tasks assessed   Oral / Motor  Motor Speech Overall Motor Speech: Appears within functional limits for tasks assessed   GO          Functional Assessment Tool Used: clinical judgment Functional Limitations: Motor speech Motor Speech Current Status [phone]): At least 1 percent but less than 20 percent impaired, limited or restricted Motor Speech Goal Status [phone]): At least 1 percent but less than 20 percent impaired, limited or restricted Motor Speech Goal Status [phone]): At least 1 percent but less than 20 percent impaired, limited or restricted         Jack [name] 07/04/2016, 8:59 AM

## 2016-07-04 NOTE — Progress Notes (Signed)
PT Cancellation Note  Patient Details Name: Jack [name] MRN: [id number] DOB: 01/29/1934   Cancelled Treatment:    Reason Eval/Treat Not Completed: Patient at procedure or test/unavailable. Pt in MRI.   Lorriane Shire 07/04/2016, 11:06 AM

## 2016-07-04 NOTE — Progress Notes (Signed)
PROGRESS NOTE    Jack [name]  [id number] DOB: 10-21-1933 DOA: 07/03/2016 PCP: Crisoforo Oxford, PA-C     Brief Narrative:  Jack [name] is a 82 y.o. male with medical history significant of HTN, HLD, CVA with residual left-sided weakness, atrial fibrillation, s/p MVR; presents with complaints of right-sided weakness. Patient notes that he got up this morning around 8 AM and was able to walk with the assistance of a cane to eat his breakfast. Following breakfast he noted that he had difficulty utilizing his right arm that he uses to hold his cane. He was also noted to have weakness in right lower extremity as well. Patient has had irregular heart beat for a few years now, but has never been on anticoagulation. Takes baby aspirin. Followed by Dr. Wynonia Lawman of cardiology. In reviewing cardiology records from Dr. Wynonia Lawman from 2015 it appears that patient was thought not to have been in A. fib, question holter monitor, and therefore was deemed not to be a candidate for anticoagulation at that time. It appears patient also was noted to have history of intercranial hemorrhage .   Assessment & Plan:   Principal Problem:   Right sided weakness Active Problems:   History of atrial fibrillation   Hx of mitral valve repair   History of stroke   Essential hypertension   TIA (transient ischemic attack)   Right-sided weakness, concern for stroke -History of CVA 2009 with left-sided residual weakness, history of [place] 2015 -Stroke team, PT OT SLP  -Ha1c pending  -MRI, MRA pending  -Echo pending  -Carotid doppler: Preliminary report:  1-39% ICA plaquing. Vertebral artery flow is antegrade.  -Continue aspirin for now   Paroxysmal A Fib, currently in normal sinus -Chadsvasc score= 5. S/p Maze procedure as well as amputation of atrial appendage in the past. Had ICH and was deemed not a candidate for anticoagulation  -Continue aspirin for now  Hypokalemia -Replaced  Essential  hypertension - Restart amlodipine  DVT prophylaxis: lovenox Code Status: Full Family Communication: no family at bedside Disposition Plan: pending further work up    Consultants:   Neurology  Procedures:   None  Antimicrobials:   None    Subjective: Patient states that yesterday, he started having right arm and right leg weakness. He is not sure if his strength has returned yet. He does have residual left-sided deficits from the stroke in 2009, as well as a hemorrhagic infarct in 2015. Currently, he has no complaints. Denies any chest pain or shortness of breath.  Objective: Vitals:   07/04/16 0100 07/04/16 0300 07/04/16 0500 07/04/16 0700  BP: (!) 166/67 (!) 159/92 (!) 167/89 (!) 164/74  Pulse: 78 79 76 74  Resp: 20 20 (!) 22 18  Temp:      TempSrc:      SpO2: 95% 96% 96% 96%  Weight:        Intake/Output Summary (Last 24 hours) at 07/04/16 0950 Last data filed at 07/04/16 0904  Gross per 24 hour  Intake           207.09 ml  Output              400 ml  Net          -192.91 ml   Filed Weights   07/03/16 1212  Weight: 66.9 kg (147 lb 8 oz)    Examination:  General exam: Appears calm and comfortable  Respiratory system: Clear to auscultation. Respiratory effort normal. Cardiovascular system: S1 &  S2 heard, RRR. No JVD, murmurs, rubs, gallops or clicks. No pedal edema. Gastrointestinal system: Abdomen is nondistended, soft and nontender. No organomegaly or masses felt. Normal bowel sounds heard. Central nervous system: Alert and oriented. No focal neurological deficits. Weak, but all limbs are equal in strength.  Extremities: Symmetric 5 x 5 power. Skin: No rashes, lesions or ulcers Psychiatry: Judgement and insight appear normal. Mood & affect appropriate.   Data Reviewed: I have personally reviewed following labs and imaging studies  CBC:  Recent Labs Lab 07/03/16 1400 07/04/16 0220  WBC 8.0 9.0  NEUTROABS 5.8  --   HGB 12.9* 12.1*  HCT 38.9* 37.0*   MCV 89.2 88.7  PLT 295 0000000   Basic Metabolic Panel:  Recent Labs Lab 07/03/16 1400 07/04/16 0220  NA 138 139  K 3.7 3.4*  CL 102 106  CO2 28 26  GLUCOSE 100* 83  BUN 14 10  CREATININE 0.82 0.78  CALCIUM 9.1 8.7*  MG  --  1.9   GFR: CrCl cannot be calculated (Unknown ideal weight.). Liver Function Tests: No results for input(s): AST, ALT, ALKPHOS, BILITOT, PROT, ALBUMIN in the last 168 hours. No results for input(s): LIPASE, AMYLASE in the last 168 hours. No results for input(s): AMMONIA in the last 168 hours. Coagulation Profile: No results for input(s): INR, PROTIME in the last 168 hours. Cardiac Enzymes: No results for input(s): CKTOTAL, CKMB, CKMBINDEX, TROPONINI in the last 168 hours. BNP (last 3 results) No results for input(s): PROBNP in the last 8760 hours. HbA1C: No results for input(s): HGBA1C in the last 72 hours. CBG: No results for input(s): GLUCAP in the last 168 hours. Lipid Profile:  Recent Labs  07/04/16 0220  CHOL 128  HDL 29*  LDLCALC 84  TRIG 74  CHOLHDL 4.4   Thyroid Function Tests: No results for input(s): TSH, T4TOTAL, FREET4, T3FREE, THYROIDAB in the last 72 hours. Anemia Panel: No results for input(s): VITAMINB12, FOLATE, FERRITIN, TIBC, IRON, RETICCTPCT in the last 72 hours. Sepsis Labs: No results for input(s): PROCALCITON, LATICACIDVEN in the last 168 hours.  No results found for this or any previous visit (from the past 240 hour(s)).     Radiology Studies: Ct Head Wo Contrast  Result Date: 07/03/2016 CLINICAL DATA:  RIGHT-side weakness for several days worse since this morning, history hypertension, stroke, atrial fibrillation, PFO EXAM: CT HEAD WITHOUT CONTRAST TECHNIQUE: Contiguous axial images were obtained from the base of the skull through the vertex without intravenous contrast. COMPARISON:  08/17/2013 FINDINGS: Brain: New generalized atrophy. Normal ventricular morphology. No midline shift or mass effect. Small vessel  chronic ischemic changes of deep cerebral white matter. Small old lacunar infarcts at the thalami. Question small old lacunar infarct at the RIGHT cerebellar hemisphere. Old RIGHT parieto-occipital infarct. No intracranial hemorrhage, mass lesion or evidence of acute infarction. No extra-axial fluid collections. Vascular: Scattered atherosclerotic calcifications Skull: Intact Sinuses/Orbits: Cleared Other: N/A IMPRESSION: Atrophy with small vessel chronic ischemic changes of deep cerebral white matter. Multiple old infarcts as above. No acute intracranial abnormalities. Electronically Signed   By: Lavonia Dana M.D.   On: 07/03/2016 15:45      Scheduled Meds: . amLODipine  10 mg Oral Daily  . aspirin EC  325 mg Oral Once  . aspirin EC  325 mg Oral Daily  . enoxaparin (LOVENOX) injection  40 mg Subcutaneous Daily   Continuous Infusions: . sodium chloride 75 mL/hr at 07/04/16 0824     LOS: 0 days  Time spent: 40 minutes   Dessa Phi, DO [facility] [email or web address] Password [email or web address] 07/04/2016, 9:50 AM

## 2016-07-04 NOTE — Progress Notes (Signed)
Rehab Admissions Coordinator Note:  Patient was screened by Retta Diones for appropriateness for an Inpatient Acute Rehab Consult.  At this time, we are recommending Inpatient Rehab consult.  Retta Diones 07/04/2016, 4:31 PM  I can be reached at [phone].

## 2016-07-05 ENCOUNTER — Observation Stay (HOSPITAL_BASED_OUTPATIENT_CLINIC_OR_DEPARTMENT_OTHER): Payer: Medicare Other

## 2016-07-05 DIAGNOSIS — Z888 Allergy status to other drugs, medicaments and biological substances status: Secondary | ICD-10-CM | POA: Diagnosis not present

## 2016-07-05 DIAGNOSIS — G459 Transient cerebral ischemic attack, unspecified: Secondary | ICD-10-CM

## 2016-07-05 DIAGNOSIS — I482 Chronic atrial fibrillation: Secondary | ICD-10-CM | POA: Diagnosis not present

## 2016-07-05 DIAGNOSIS — I63412 Cerebral infarction due to embolism of left middle cerebral artery: Secondary | ICD-10-CM | POA: Diagnosis not present

## 2016-07-05 DIAGNOSIS — D62 Acute posthemorrhagic anemia: Secondary | ICD-10-CM | POA: Diagnosis present

## 2016-07-05 DIAGNOSIS — Q211 Atrial septal defect: Secondary | ICD-10-CM | POA: Diagnosis not present

## 2016-07-05 DIAGNOSIS — R29702 NIHSS score 2: Secondary | ICD-10-CM | POA: Diagnosis present

## 2016-07-05 DIAGNOSIS — I6349 Cerebral infarction due to embolism of other cerebral artery: Secondary | ICD-10-CM | POA: Diagnosis present

## 2016-07-05 DIAGNOSIS — Z23 Encounter for immunization: Secondary | ICD-10-CM | POA: Diagnosis present

## 2016-07-05 DIAGNOSIS — I272 Pulmonary hypertension, unspecified: Secondary | ICD-10-CM | POA: Diagnosis present

## 2016-07-05 DIAGNOSIS — I639 Cerebral infarction, unspecified: Secondary | ICD-10-CM

## 2016-07-05 DIAGNOSIS — I4891 Unspecified atrial fibrillation: Secondary | ICD-10-CM

## 2016-07-05 DIAGNOSIS — E785 Hyperlipidemia, unspecified: Secondary | ICD-10-CM | POA: Diagnosis present

## 2016-07-05 DIAGNOSIS — R531 Weakness: Secondary | ICD-10-CM | POA: Diagnosis present

## 2016-07-05 DIAGNOSIS — G8114 Spastic hemiplegia affecting left nondominant side: Secondary | ICD-10-CM | POA: Diagnosis present

## 2016-07-05 DIAGNOSIS — R350 Frequency of micturition: Secondary | ICD-10-CM | POA: Diagnosis not present

## 2016-07-05 DIAGNOSIS — Z79899 Other long term (current) drug therapy: Secondary | ICD-10-CM | POA: Diagnosis not present

## 2016-07-05 DIAGNOSIS — Z9119 Patient's noncompliance with other medical treatment and regimen: Secondary | ICD-10-CM | POA: Diagnosis not present

## 2016-07-05 DIAGNOSIS — G8110 Spastic hemiplegia affecting unspecified side: Secondary | ICD-10-CM | POA: Diagnosis not present

## 2016-07-05 DIAGNOSIS — I6339 Cerebral infarction due to thrombosis of other cerebral artery: Secondary | ICD-10-CM | POA: Diagnosis not present

## 2016-07-05 DIAGNOSIS — Z7982 Long term (current) use of aspirin: Secondary | ICD-10-CM | POA: Diagnosis not present

## 2016-07-05 DIAGNOSIS — I10 Essential (primary) hypertension: Secondary | ICD-10-CM | POA: Diagnosis present

## 2016-07-05 DIAGNOSIS — I480 Paroxysmal atrial fibrillation: Secondary | ICD-10-CM | POA: Diagnosis present

## 2016-07-05 DIAGNOSIS — E876 Hypokalemia: Secondary | ICD-10-CM | POA: Diagnosis present

## 2016-07-05 HISTORY — DX: Cerebral infarction, unspecified: I63.9

## 2016-07-05 LAB — BASIC METABOLIC PANEL
ANION GAP: 6 (ref 5–15)
BUN: 10 mg/dL (ref 6–20)
CALCIUM: 8.8 mg/dL — AB (ref 8.9–10.3)
CO2: 27 mmol/L (ref 22–32)
Chloride: 102 mmol/L (ref 101–111)
Creatinine, Ser: 0.82 mg/dL (ref 0.61–1.24)
Glucose, Bld: 115 mg/dL — ABNORMAL HIGH (ref 65–99)
POTASSIUM: 3.5 mmol/L (ref 3.5–5.1)
SODIUM: 135 mmol/L (ref 135–145)

## 2016-07-05 LAB — ECHOCARDIOGRAM COMPLETE: Weight: 2360 oz

## 2016-07-05 NOTE — Progress Notes (Signed)
Chart reviewed.  Pt Min guard 136ft for ambulation of day of eval.  Will cont to follow as pt will likely continue to make functional progress.  Will defer formal rehab consult at this time.  Delice Lesch, MD, Mellody Drown

## 2016-07-05 NOTE — Progress Notes (Signed)
PROGRESS NOTE    Jack [name]  [id number] DOB: 1934/06/15 DOA: 07/03/2016 PCP: Crisoforo Oxford, PA-C     Brief Narrative:  Jack [name] is a 82 y.o. male with medical history significant of HTN, HLD, CVA with residual left-sided weakness, atrial fibrillation, s/p MVR; presents with complaints of right-sided weakness. Patient notes that he got up this morning around 8 AM and was able to walk with the assistance of a cane to eat his breakfast. Following breakfast he noted that he had difficulty utilizing his right arm that he uses to hold his cane. He was also noted to have weakness in right lower extremity as well. Patient has had irregular heart beat for a few years now, but has never been on anticoagulation. Takes baby aspirin. Followed by Dr. Wynonia Lawman of cardiology. In reviewing cardiology records from Dr. Wynonia Lawman from 2015 it appears that patient was thought not to have been in A. fib, question holter monitor, and therefore was deemed not to be a candidate for anticoagulation at that time. It appears patient also was noted to have history of intercranial hemorrhage .   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 Vertebral artery flow is antegrade.  -Stroke team, PT OT SLP  -Ha1c pending  -Echo pending  -Continue aspirin for now   Paroxysmal A Fib, currently in normal sinus -Chadsvasc score= 5. S/p Maze procedure as well as amputation of atrial appendage in the past. Had ICH and was deemed not a candidate for anticoagulation  -Continue aspirin for now. Recommend  anticoagulation with eliquis to prevent stroke, but does have hx of ICH in 2015. I discussed the risks and benefits of eliquis vs aspirin alone. Currently, he is refusing anticoagulation even if it means he could have another stroke. He would like to discuss further with Dr. Wynonia Lawman. I will plan to touch base with his office prior to discharge   Essential hypertension -Amlodipine  DVT prophylaxis: lovenox Code Status: Full Family Communication: no family at bedside, couldn't reach over phone  Disposition Plan: pending further work up , CIR vs SNF    Consultants:   Neurology  Procedures:   None  Antimicrobials:   None    Subjective: No new complaints today. Denies any chest pain, shortness of breath, nausea or vomiting. We had an extensive discussion regarding anticoagulant this morning.  Objective: Vitals:   07/04/16 2059 07/05/16 0057 07/05/16 0506 07/05/16 0954  BP: (!) 149/84 (!) 157/75 (!) 151/78 (!) 151/70  Pulse: 85 80 72 81  Resp: 18 18 18 18   Temp: 98.8 F (37.1 C) 98.2 F (36.8 C) 98.1 F (36.7 C) 98.7 F (37.1 C)  TempSrc: Oral Oral Oral Oral  SpO2: 97% 94% 97% 98%  Weight:        Intake/Output Summary (Last 24 hours) at 07/05/16 1109 Last data filed at 07/05/16 0057  Gross per 24 hour  Intake                0 ml  Output  225 ml  Net             -225 ml   Filed Weights   07/03/16 1212  Weight: 66.9 kg (147 lb 8 oz)    Examination:  General exam: Appears calm and comfortable  Respiratory system: Clear to auscultation. Respiratory effort normal. Cardiovascular system: S1 & S2 heard, RRR. No JVD, murmurs, rubs, gallops or clicks. No pedal edema. Gastrointestinal system: Abdomen is nondistended, soft and nontender. No organomegaly or masses felt. Normal bowel sounds heard. Central nervous system: Alert and oriented. No focal neurological deficits. Weak, but all limbs are equal in strength.  Extremities: Symmetric 5 x 5 power. Skin: No  rashes, lesions or ulcers Psychiatry: Judgement and insight appear normal. Mood & affect appropriate.   Data Reviewed: I have personally reviewed following labs and imaging studies  CBC:  Recent Labs Lab 07/03/16 1400 07/04/16 0220  WBC 8.0 9.0  NEUTROABS 5.8  --   HGB 12.9* 12.1*  HCT 38.9* 37.0*  MCV 89.2 88.7  PLT 295 0000000   Basic Metabolic Panel:  Recent Labs Lab 07/03/16 1400 07/04/16 0220  NA 138 139  K 3.7 3.4*  CL 102 106  CO2 28 26  GLUCOSE 100* 83  BUN 14 10  CREATININE 0.82 0.78  CALCIUM 9.1 8.7*  MG  --  1.9   GFR: CrCl cannot be calculated (Unknown ideal weight.). Liver Function Tests: No results for input(s): AST, ALT, ALKPHOS, BILITOT, PROT, ALBUMIN in the last 168 hours. No results for input(s): LIPASE, AMYLASE in the last 168 hours. No results for input(s): AMMONIA in the last 168 hours. Coagulation Profile: No results for input(s): INR, PROTIME in the last 168 hours. Cardiac Enzymes: No results for input(s): CKTOTAL, CKMB, CKMBINDEX, TROPONINI in the last 168 hours. BNP (last 3 results) No results for input(s): PROBNP in the last 8760 hours. HbA1C: No results for input(s): HGBA1C in the last 72 hours. CBG: No results for input(s): GLUCAP in the last 168 hours. Lipid Profile:  Recent Labs  07/04/16 0220  CHOL 128  HDL 29*  LDLCALC 84  TRIG 74  CHOLHDL 4.4   Thyroid Function Tests: No results for input(s): TSH, T4TOTAL, FREET4, T3FREE, THYROIDAB in the last 72 hours. Anemia Panel: No results for input(s): VITAMINB12, FOLATE, FERRITIN, TIBC, IRON, RETICCTPCT in the last 72 hours. Sepsis Labs: No results for input(s): PROCALCITON, LATICACIDVEN in the last 168 hours.  No results found for this or any previous visit (from the past 240 hour(s)).     Radiology Studies: Ct Head Wo Contrast  Result Date: 07/03/2016 CLINICAL DATA:  RIGHT-side weakness for several days worse since this morning, history hypertension, stroke, atrial  fibrillation, PFO EXAM: CT HEAD WITHOUT CONTRAST TECHNIQUE: Contiguous axial images were obtained from the base of the skull through the vertex without intravenous contrast. COMPARISON:  08/17/2013 FINDINGS: Brain: New generalized atrophy. Normal ventricular morphology. No midline shift or mass effect. Small vessel chronic ischemic changes of deep cerebral white matter. Small old lacunar infarcts at the thalami. Question small old lacunar infarct at the RIGHT cerebellar hemisphere. Old RIGHT parieto-occipital infarct. No intracranial hemorrhage, mass lesion or evidence of acute infarction. No extra-axial fluid collections. Vascular: Scattered atherosclerotic calcifications Skull: Intact Sinuses/Orbits: Cleared Other: N/A IMPRESSION: Atrophy with small vessel chronic ischemic changes of deep cerebral white matter. Multiple old infarcts as above. No acute intracranial abnormalities. Electronically Signed   By: Lavonia Dana M.D.   On: 07/03/2016 15:45   Mr Brain Wo Contrast  Result Date: 07/04/2016 CLINICAL DATA:  Right-sided weakness. EXAM: MRI HEAD WITHOUT CONTRAST MRA HEAD WITHOUT CONTRAST TECHNIQUE: Multiplanar, multiecho pulse sequences of the brain and surrounding structures were obtained without intravenous contrast. Angiographic images of the head were obtained using MRA technique without contrast. COMPARISON:  Head CT 07/03/2016.  Head MRI/ MRA 12/12/2007. FINDINGS: MRI HEAD FINDINGS The examination had to be discontinued prior to completion due to patient vomiting. Sagittal T1, axial and coronal diffusion, axial T2, and axial FLAIR sequences were obtained. The the FLAIR sequence is severely motion degraded and nondiagnostic. Sagittal T1 and axial T2 sequences are mildly motion degraded. Brain: There are small foci of acute cortical and subcortical infarction in the high left frontoparietal region extending inferiorly into the centrum semiovale. There are also 2 punctate foci of acute cortical infarction  in the right occipital lobe. Bilateral cerebral white matter T2 hyperintensities have progressed from 2009 and are compatible with chronic small vessel ischemic disease. Chronic right frontoparietal and left parieto-occipital infarcts are new from 2009. There is moderate cerebral atrophy. Small chronic infarcts are present in the thalami and left cerebellum, with the thalamic infarcts new from 2009. No gross intracranial hemorrhage, mass, midline shift, or extra-axial fluid collection is seen. Vascular: Grossly preserved flow voids. Skull and upper cervical spine: No destructive osseous lesion. Sinuses/Orbits: Grossly unremarkable orbits. No significant sinus disease. Other: None. MRA HEAD FINDINGS The study is mildly motion degraded. The visualized distal vertebral arteries are patent to the basilar with the right being minimally larger than the left. There is focal narrowing versus flow artifact involving the distal left vertebral artery at the vertebrobasilar junction. PICAs and SCAs appear patent proximally with atherosclerotic narrowing versus artifact in the left SCA. The basilar artery is widely patent. There is a patent left posterior communicating artery. PCAs are patent without evidence of significant stenosis. The internal carotid arteries are widely patent from skullbase to carotid termini. ACAs and MCAs are patent without evidence of significant proximal stenosis. Detailed branch vessel evaluation is limited by motion artifact. No intracranial aneurysm is identified. IMPRESSION: 1. Incomplete, motion degraded examination as above. 2. Small acute cortical/subcortical infarcts in the high left frontoparietal region. 3. Punctate acute right occipital lobe cortical infarcts. 4. Chronic cerebral and cerebellar infarcts as above. 5. No evidence of major intracranial arterial occlusion or significant proximal stenosis. Electronically Signed   By: Logan Bores M.D.   On: 07/04/2016 11:58   [name] Head/brain  [id number] Cm  Result Date: 07/04/2016 CLINICAL DATA:  Right-sided weakness. EXAM: MRI HEAD WITHOUT CONTRAST MRA HEAD WITHOUT CONTRAST TECHNIQUE: Multiplanar, multiecho pulse sequences of the brain and surrounding structures were obtained without intravenous contrast. Angiographic images of the head were obtained using MRA technique without contrast. COMPARISON:  Head CT 07/03/2016.  Head MRI/ MRA 12/12/2007. FINDINGS: MRI HEAD FINDINGS The examination had to be discontinued prior to completion due to patient vomiting. Sagittal T1, axial and coronal diffusion, axial T2, and axial FLAIR sequences were obtained. The the FLAIR sequence is severely motion degraded and nondiagnostic. Sagittal T1 and axial T2 sequences are mildly motion degraded. Brain: There are small foci of acute cortical and subcortical infarction in the high left frontoparietal region extending inferiorly into the centrum semiovale. There are also 2 punctate foci of acute cortical infarction in the right occipital lobe. Bilateral cerebral white matter T2 hyperintensities have progressed from 2009 and are compatible with chronic small vessel ischemic disease. Chronic right frontoparietal and left parieto-occipital infarcts are new from 2009. There is  moderate cerebral atrophy. Small chronic infarcts are present in the thalami and left cerebellum, with the thalamic infarcts new from 2009. No gross intracranial hemorrhage, mass, midline shift, or extra-axial fluid collection is seen. Vascular: Grossly preserved flow voids. Skull and upper cervical spine: No destructive osseous lesion. Sinuses/Orbits: Grossly unremarkable orbits. No significant sinus disease. Other: None. MRA HEAD FINDINGS The study is mildly motion degraded. The visualized distal vertebral arteries are patent to the basilar with the right being minimally larger than the left. There is focal narrowing versus flow artifact involving the distal left vertebral artery at the vertebrobasilar  junction. PICAs and SCAs appear patent proximally with atherosclerotic narrowing versus artifact in the left SCA. The basilar artery is widely patent. There is a patent left posterior communicating artery. PCAs are patent without evidence of significant stenosis. The internal carotid arteries are widely patent from skullbase to carotid termini. ACAs and MCAs are patent without evidence of significant proximal stenosis. Detailed branch vessel evaluation is limited by motion artifact. No intracranial aneurysm is identified. IMPRESSION: 1. Incomplete, motion degraded examination as above. 2. Small acute cortical/subcortical infarcts in the high left frontoparietal region. 3. Punctate acute right occipital lobe cortical infarcts. 4. Chronic cerebral and cerebellar infarcts as above. 5. No evidence of major intracranial arterial occlusion or significant proximal stenosis. Electronically Signed   By: Logan Bores M.D.   On: 07/04/2016 11:58      Scheduled Meds: . amLODipine  10 mg Oral Daily  . aspirin EC  325 mg Oral Once  . aspirin EC  325 mg Oral Daily  . enoxaparin (LOVENOX) injection  40 mg Subcutaneous Daily  . pravastatin  40 mg Oral q1800   Continuous Infusions:    LOS: 0 days    Time spent: 61minutes   Dessa Phi, DO [facility] [email or web address] Password [email or web address] 07/05/2016, 11:09 AM

## 2016-07-05 NOTE — Progress Notes (Signed)
Occupational Therapy Evaluation Patient Details Name: Jack [name] MRN: [id number] DOB: 08/21/1933 Today's Date: 07/05/2016    History of Present Illness 82 y.o. male with a history of stroke 2 years ago with residual left hemiparesis, hypertension, hyperlipidemia and atrial fibrillation not on anticoagulation, seen in the ED at [facility] with a complaint of new onset right-sided weakness.  MRI revealed small acute cortical/subcortical infarcts in the high left frontoparietal region and punctate acute right occipital lobe cortical infarcts.    Clinical Impression   PTA, pt modified independent with mobility and ADL. Pt currently requires Mod A with ADL and is high fall risk. Unable to sit unsupported EOB during functional task without falling over to left. Feel pt will benefit from CIR to achieve PLOF. Will follow acutely to facilitate safe DC to next venue of care.      Follow Up Recommendations  CIR;Supervision/Assistance - 24 hour    Equipment Recommendations  3 in 1 bedside commode    Recommendations for Other Services Rehab consult     Precautions / Restrictions Precautions Precautions: Fall Restrictions Weight Bearing Restrictions: No      Mobility Bed Mobility Overal bed mobility: Needs Assistance Bed Mobility: Supine to Sit     Supine to sit: Supervision;HOB elevated        Transfers Overall transfer level: Needs assistance Equipment used: Rolling walker (2 wheeled) Transfers: Sit to/from [facility] Sit to Stand: Min assist Stand pivot transfers: Min assist            Balance Overall balance assessment: Needs assistance Sitting-balance support: Feet supported Sitting balance-Leahy Scale: Fair Sitting balance - Comments: Leans L Postural control: Posterior lean;Left lateral lean   Standing balance-Leahy Scale: Poor                              ADL Overall ADL's : Needs assistance/impaired Eating/Feeding: Set  up Eating/Feeding Details (indicate cue type and reason): issued red tubing to help with holding utnesil Grooming: Set up;Supervision/safety;Sitting   Upper Body Bathing: Minimal assistance;Sitting Upper Body Bathing Details (indicate cue type and reason): Pt unable to maintain static sitting balance when engaged in functional task using BUE. Falls to L Lower Body Bathing: Moderate assistance;Sit to/from stand   Upper Body Dressing : Moderate assistance;Sitting   Lower Body Dressing: Moderate assistance;Sit to/from stand   Toilet Transfer: Minimal assistance;Stand-pivot   Toileting- Clothing Manipulation and Hygiene: Moderate assistance;Sit to/from stand       Functional mobility during ADLs: Minimal assistance (stand pivot only today)       Vision Vision Assessment?: Vision impaired- to be further tested in functional context Additional Comments: will further assess   Perception     Praxis      Pertinent Vitals/Pain Pain Assessment: No/denies pain Pain Score: 0-No pain     Hand Dominance Left   Extremity/Trunk Assessment Upper Extremity Assessment Upper Extremity Assessment: RUE deficits/detail;LUE deficits/detail RUE Deficits / Details: @ 4/5 throughout. Appears to have ataxic movements  RUE Coordination: decreased fine motor LUE Deficits / Details: at baseline. ataxia. sensory deficits. States it feels "numb" LUE Sensation: decreased light touch;decreased proprioception   Lower Extremity Assessment Lower Extremity Assessment: Defer to PT evaluation   Cervical / Trunk Assessment Cervical / Trunk Assessment: Kyphotic   Communication Communication Communication: HOH   Cognition Arousal/Alertness: Awake/alert Behavior During Therapy: WFL for tasks assessed/performed Overall Cognitive Status: History of cognitive impairments - at baseline  General Comments       Exercises       Shoulder Instructions      Home Living  Family/patient expects to be discharged to:: Private residence Living Arrangements: Spouse/significant other Available Help at Discharge: Family Type of Home: House Home Access: Level entry     Home Layout: Multi-level;Bed/bath upstairs;1/2 bath on main level Alternate Level Stairs-Number of Steps: 2 sets of 7 steps  Alternate Level Stairs-Rails: Right Bathroom Shower/Tub: Tub/shower unit;Door;Walk-in shower Shower/tub characteristics: Architectural technologist: Standard Bathroom Accessibility: Yes How Accessible: Accessible via walker Home Equipment: Cane - quad;Cane - single point;Walker - 2 wheels      Lives With: Spouse    Prior Functioning/Environment Level of Independence: Independent with assistive device(s)        Comments: independent without A.D. in house and with [facility] in community. Goes to the gym with his wife 2 x week to walk. Community ambulator.        OT Problem List: Decreased strength;Decreased range of motion;Decreased activity tolerance;Impaired balance (sitting and/or standing);Decreased coordination;Decreased safety awareness;Decreased knowledge of use of DME or AE;Impaired sensation;Impaired tone;Impaired UE functional use   OT Treatment/Interventions: Self-care/ADL training;Therapeutic exercise;Neuromuscular education;DME and/or AE instruction;Therapeutic activities;Cognitive remediation/compensation;Patient/family education;Visual/perceptual remediation/compensation;Balance training    OT Goals(Current goals can be found in the care plan section) Acute Rehab OT Goals Patient Stated Goal: independence OT Goal Formulation: With patient Time For Goal Achievement: 07/21/15 Potential to Achieve Goals: Good ADL Goals Pt Will Perform Upper Body Bathing: with modified independence;sitting Pt Will Perform Lower Body Bathing: with modified independence;sit to/from stand Pt Will Perform Upper Body Dressing: with modified independence;sitting Pt Will Perform Lower  Body Dressing: with modified independence;sit to/from stand Pt Will Transfer to Toilet: with modified independence;ambulating;bedside commode Pt Will Perform Toileting - Clothing Manipulation and hygiene: with modified independence;sitting/lateral leans  OT Frequency: Min 2X/week   Barriers to D/C:            Co-evaluation              End of Session Nurse Communication: Mobility status  Activity Tolerance: Patient tolerated treatment well Patient left: in chair;with call bell/phone within reach;with chair alarm set   Time: [id number] OT Time Calculation (min): 26 min Charges:  OT General Charges $OT Visit: 1 Procedure OT Evaluation $OT Eval Moderate Complexity: 1 Procedure OT Treatments $Self Care/Home Management : 8-22 mins G-Codes: OT G-codes **NOT FOR INPATIENT CLASS** Functional Assessment Tool Used: clinical judgement Functional Limitation: Self care Self Care Current Status [id number]): At least 20 percent but less than 40 percent impaired, limited or restricted Self Care Goal Status [id number]): At least 1 percent but less than 20 percent impaired, limited or restricted  [facility] 07/05/2016, 11:57 AM   [facility], OTR/L  [phone] 07/05/2016

## 2016-07-05 NOTE — Progress Notes (Signed)
  Echocardiogram 2D Echocardiogram has been performed.  Jennette Dubin 07/05/2016, 10:01 AM

## 2016-07-05 NOTE — Progress Notes (Addendum)
STROKE TEAM PROGRESS NOTE   HISTORY OF PRESENT ILLNESS (per record) Jack [name] is an 82 y.o. 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 Deficit was present when he woke up this morning. He was last known well at 10:00 last night. His wife noted difficulty with ambulation with coordination of his right lower extremity as well as coordination of his right upper extremity using his cane. CT scan of his head showed no acute changes. MRI is pending. NIH stroke score at the time of this evaluation was 2. Patient was LKW 10:00 PM on 05/02/2016. tPA was not given as beyond time under for treatment consideration when he arrived in the ED.    SUBJECTIVE (INTERVAL HISTORY)  Patient feels his condition is improving. He has had multiple strokes and had a hemorrhagic stroke in 2015 and has been reluctant to consider anticoagulation despite coming back with recurrent strokes over the years.    OBJECTIVE Temp:  [98.1 F (36.7 C)-98.8 F (37.1 C)] 98.1 F (36.7 C) (12/25 0506) Pulse Rate:  [72-98] 72 (12/25 0506) Cardiac Rhythm: Atrial fibrillation (12/25 0700) Resp:  [18-20] 18 (12/25 0506) BP: (110-157)/(75-89) 151/78 (12/25 0506) SpO2:  [94 %-97 %] 97 % (12/25 0506)  CBC:   Recent Labs Lab 07/03/16 1400 07/04/16 0220  WBC 8.0 9.0  NEUTROABS 5.8  --   HGB 12.9* 12.1*  HCT 38.9* 37.0*  MCV 89.2 88.7  PLT 295 0000000    Basic Metabolic Panel:   Recent Labs Lab 07/03/16 1400 07/04/16 0220  NA 138 139  K 3.7 3.4*  CL 102 106  CO2 28 26  GLUCOSE 100* 83  BUN 14 10  CREATININE 0.82 0.78  CALCIUM 9.1 8.7*  MG  --  1.9    Lipid Panel:     Component Value Date/Time   CHOL 128 07/04/2016 0220   TRIG 74 07/04/2016 0220   HDL 29 (L) 07/04/2016 0220   CHOLHDL 4.4 07/04/2016 0220   VLDL 15 07/04/2016 0220    LDLCALC 84 07/04/2016 0220   HgbA1c:  Lab Results  Component Value Date   HGBA1C  12/12/2007    5.5 (NOTE)   The ADA recommends the following therapeutic goals for glycemic   control related to Hgb A1C measurement:   Goal of Therapy:   < 7.0% Hgb A1C   Action Suggested:  > 8.0% Hgb A1C   Ref:  Diabetes Care, 22, Suppl. 1, 1999   Urine Drug Screen:     Component Value Date/Time   LABOPIA NONE DETECTED 08/02/2013 1206   COCAINSCRNUR NONE DETECTED 08/02/2013 1206   LABBENZ NONE DETECTED 08/02/2013 1206   AMPHETMU NONE DETECTED 08/02/2013 1206   THCU NONE DETECTED 08/02/2013 1206   LABBARB NONE DETECTED 08/02/2013 1206      IMAGING  Ct Head Wo Contrast 07/03/2016 Atrophy with small vessel chronic ischemic changes of deep cerebral white matter. Multiple old infarcts as above. No acute intracranial abnormalities.   Mri / Mra Head/brain Wo Cm 07/04/2016 1. Incomplete, motion degraded examination as above.  2. Small acute cortical/subcortical infarcts in the high left frontoparietal region.  3. Punctate acute right occipital lobe cortical infarcts.  4. Chronic cerebral and cerebellar infarcts as above.  5. No evidence of major intracranial arterial occlusion or significant proximal stenosis.   Lower extremity venous duplex  No  evidence of DVT or baker's cyst.   PHYSICAL EXAM HEENT-  Normocephalic, no lesions, without obvious abnormality.  Normal external eye and conjunctiva.  Normal TM's bilaterally.  Normal auditory canals and external ears. Normal external nose, mucus membranes and septum.  Normal pharynx. Neck supple with no masses, nodes, nodules or enlargement. Cardiovascular - regularly irregular rhythm, S1, S2 normal and no S3 or S4 Lungs - chest clear, no wheezing, rales, normal symmetric air entry Abdomen - soft, non-tender; bowel sounds normal; no masses,  no organomegaly Extremities - no joint deformities, effusion, or inflammation  Neurologic  Examination: Mental Status: Alert, oriented to name, date, location, situation, no acute distress.  Speech fluent without evidence of aphasia. Able to follow commands without difficulty. Cranial Nerves: II-Visual fields were normal. III/IV/VI-Pupils were equal and reacted normally to light. Extraocular movements were full and conjugate. V/VII-no facial numbness and no facial weakness. VIII: Significantly impaired hearing X-normal speech. XI: trapezius strength/neck flexion strength normal bilaterally XII-midline tongue extension with normal strength. Motor: Significant weakness of right grip and intrinsic hand muscles. Fine finger movements are diminished on the right compared to the left. Orbits left over right upper extremity. mild left hemiparesis from previous stroke. No Drift of the right arm or right leg.  Sensory: Patient endorses symmetric sensation to light touch Deep Tendon Reflexes:  symmetric. Plantars: Flexor bilaterally Cerebellar: mild dysmetria noted FTN on the left UE but intact in the other limbs Gait: Deferred    ASSESSMENT/PLAN [name] is a 82 y.o. male with history of a stroke 2 years ago with residual left hemiparesis, PFO, hypertension, hyperlipidemia and atrial fibrillation not on anticoagulation  presenting with new onset right-sided weakness. He did not receive IV t-PA due to late presentation.  Multiple Strokes:  Dominant infarct felt to be embolic secondary to atrial fibrillation without anticoagulation. 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 Chronic cerebral and cerebellar infarcts.  MRA - No evidence of major intracranial arterial occlusion or significant proximal stenosis.   Bilateral L E venous duplex - negative for DVT  Carotid Doppler - 1-39%  ICA plaquing. Vertebral artery flow is antegrade.   2D Echo - pending  LDL - 84  HgbA1c pending  VTE prophylaxis - Lovenox Diet Heart Room service appropriate? Yes; Fluid consistency: Thin  aspirin 81 mg daily prior to admission, now on aspirin 325 mg daily  Patient counseled to be compliant with his antithrombotic medications  Ongoing aggressive stroke risk factor management  Atrial fibrillation - recommend anticoagulation (cardiologist - Dr Wynonia Lawman) [place]. Discussed with his attending Dr. Maylene Roes, she will call his cardiologist and discuss with patient and family.  Therapy recommendations:  pending  Disposition: Pending  Atrial Fibrillation  Home anticoagulation:  aspirin 81 mg daily continued in the hospital  CHA2DS2-VASc Score = 5, ?2 oral anticoagulation recommended    Continue aspirin at discharge a snot a good antiocoagulation candidate long term due to h/o ICH. Consider left atrial occlusion with watchman device   Hypertension  Stable  Permissive hypertension (OK if < 220/120) but gradually normalize in 5-7 days  Long-term BP goal normotensive  Hyperlipidemia  Home meds:  Pravachol 20 mg daily not resumed in hospital  LDL 84, goal < 70  Increase Pravachol to 40 mg daily  Continue statin at discharge  Other Stroke Risk Factors  Advanced age  Overweight, Body mass index is 26.98 kg/m., recommend weight loss, diet and exercise as appropriate   Hx stroke/TIA  Atrial Fibrillation without anticoagulation.  Other Active Problems  PFO hx ->   lower extremity venous Dopplers negative  Atrial fibrillation - recommend  Watchman device (cardiologist - Dr Wynonia Lawman) [place]. Discussed with his attending Dr. Maylene Roes, she will call his cardiologist and discuss with patient and family.    previous history of [place] I jan 2015 participated in [facility] 2 study  Mild hypokalemia - 3.4 (supplemented)  Hospital day # 0  I have personally examined this patient, reviewed  notes, independently viewed imaging studies, participated in medical decision making and plan of care.ROS completed by me personally and pertinent positives fully documented  I have made any additions or clarifications directly to the above note.  Patient has presented with right hand weakness due to embolic left frontal MCA branch infarcts from atrial fibrillation. He however is not a good long-term anticoagulation candidate given history of thalamic hemorrhage in January 2015. I agree with continuing aspirin but recommend consideration for left atrial appendage occlusion using watchman device. I have consulted Dr. Rayann Heman for the same. Transfer to inpatient rehabilitation over the next few days. Greater than 50% time during this 35 minute visit was spent on counseling and coordination of care about his atrial fibrillation, stroke and intracerebral hemorrhage and risk, anticoagulation and answered questions discuss with Dr. Rayann Heman and Melvenia Needles, MD Medical Director [place] Pager: [phone] 07/05/2016 1:26 PM   To contact Stroke Continuity provider, please refer to [email or web address] After hours, contact General Neurology

## 2016-07-06 ENCOUNTER — Encounter (HOSPITAL_COMMUNITY): Payer: Self-pay | Admitting: Internal Medicine

## 2016-07-06 DIAGNOSIS — R531 Weakness: Secondary | ICD-10-CM

## 2016-07-06 DIAGNOSIS — Z91199 Patient's noncompliance with other medical treatment and regimen due to unspecified reason: Secondary | ICD-10-CM

## 2016-07-06 DIAGNOSIS — D62 Acute posthemorrhagic anemia: Secondary | ICD-10-CM

## 2016-07-06 DIAGNOSIS — E876 Hypokalemia: Secondary | ICD-10-CM

## 2016-07-06 DIAGNOSIS — I10 Essential (primary) hypertension: Secondary | ICD-10-CM

## 2016-07-06 DIAGNOSIS — Q2112 Patent foramen ovale: Secondary | ICD-10-CM

## 2016-07-06 DIAGNOSIS — I6930 Unspecified sequelae of cerebral infarction: Secondary | ICD-10-CM

## 2016-07-06 DIAGNOSIS — I639 Cerebral infarction, unspecified: Secondary | ICD-10-CM

## 2016-07-06 DIAGNOSIS — Z9889 Other specified postprocedural states: Secondary | ICD-10-CM

## 2016-07-06 DIAGNOSIS — I4891 Unspecified atrial fibrillation: Secondary | ICD-10-CM

## 2016-07-06 DIAGNOSIS — Q211 Atrial septal defect: Secondary | ICD-10-CM

## 2016-07-06 DIAGNOSIS — I63412 Cerebral infarction due to embolism of left middle cerebral artery: Secondary | ICD-10-CM

## 2016-07-06 DIAGNOSIS — Z9119 Patient's noncompliance with other medical treatment and regimen: Secondary | ICD-10-CM

## 2016-07-06 DIAGNOSIS — I480 Paroxysmal atrial fibrillation: Secondary | ICD-10-CM

## 2016-07-06 LAB — BASIC METABOLIC PANEL
Anion gap: 8 (ref 5–15)
BUN: 11 mg/dL (ref 6–20)
CHLORIDE: 103 mmol/L (ref 101–111)
CO2: 26 mmol/L (ref 22–32)
CREATININE: 0.86 mg/dL (ref 0.61–1.24)
Calcium: 9.2 mg/dL (ref 8.9–10.3)
GFR calc non Af Amer: >60 mL/min (ref >60)
Glucose, Bld: 97 mg/dL (ref 65–99)
Potassium: 3.4 mmol/L — ABNORMAL LOW (ref 3.5–5.1)
Sodium: 137 mmol/L (ref 135–145)

## 2016-07-06 LAB — HEMOGLOBIN A1C
Hgb A1c MFr Bld: 5.5 % (ref 4.8–5.6)
Mean Plasma Glucose: 111 mg/dL

## 2016-07-06 MED ORDER — POTASSIUM CHLORIDE CRYS ER 20 MEQ PO TBCR
40.0000 meq | EXTENDED_RELEASE_TABLET | Freq: Once | ORAL | Status: AC
  Administered 2016-07-06: 40 meq via ORAL
  Filled 2016-07-06: qty 2

## 2016-07-06 MED ORDER — SODIUM CHLORIDE 0.9 % IV SOLN
INTRAVENOUS | Status: DC
  Administered 2016-07-07 (×2): via INTRAVENOUS

## 2016-07-06 NOTE — Consult Note (Addendum)
CARDIOLOGY CONSULT NOTE     Primary Care Physician: Crisoforo Oxford, PA-C Referring Physician:  Dr Leonie Man Primary Cardiologist:  Dr Wynonia Lawman  Admit Date: 07/03/2016  Reason for consultation:  afib and stroke  Jack [name] is a 82 y.o. 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  He is well known to Dr Wynonia Lawman.  He has had prior ICH which Dr Leonie Man feels may have been due to hypertensive episode.  He now presents with Stroke.  I have spoken with Dr Leonie Man who feels that his stroke is likely embolic.  The patient is currently in sinus rhythm with PACs but does have episodes of afib.  Cardiology is consulted for consideration of Watchman LAA occlusive device.  Today, he denies symptoms of palpitations, chest pain, shortness of breath, orthopnea, PND, lower extremity edema, dizziness, presyncope, syncope. The patient is tolerating medications without difficulties and is otherwise without complaint today.   Past Medical History:  Diagnosis Date  . Atrial fibrillation ([place])    Dr. Tollie Eth  . BPH (benign prostatic hypertrophy) 2012   prior therapy with finasteride, but as of 03/2015 no c/o; Dr. Luanne Bras  . Cellulitis of face 11/08   [facility] ENT  . CVA (cerebral infarction) 2009, 2015  . Echocardiogram abnormal 02/11/2010   LVEF 70%, severe mitral regurgitation, pulm HTN   . ED (erectile dysfunction)    Viagra prn, consult with Urology 2012  . Elevated PSA 2012   BPH; Dr. Luanne Bras, [facility]; no more PSAs needed as of 2012 consult  . Gait abnormality    uses cane, s/p CVA  . Hemiparesis affecting left side as late effect of cerebrovascular accident ([place])   . Hiatal hernia    per 2008 EUS  . Hyperlipidemia   . Hypertension   . Microscopic hematuria    chronic, eval 2012 with Urology, benign at that time  . Obstructive uropathy 2012   Dr. Luanne Bras  .  Pancreatic mass 01/2006   inconsequential per CT 2007  . 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 Patent foramen ovale    hx/o, notation of fistula on echocardiogram from 2009  . Prophylactic antibiotic 2016   requires due to valve disease  . Schatzki's ring 2008   per endoscopic ultrasound; Dr. Janetta Hora. [name]  . Stroke ([place])   . Ventral hernia    surgery consult 01/2010 with Dr. Excell Seltzer, elective repair if desired , but he declined  . Wears hearing aid    Past Surgical History:  Procedure Laterality Date  . CARDIAC CATHETERIZATION  02/2010   LVEF 70%, severe mitral regurgitation, pulm HTN; prior in 01/2006  . INGUINAL HERNIA REPAIR     bilat  . MITRAL VALVE REPAIR  01/2006   mitral repair with Maze procedure, tricuspid annuloplasty, Dr. Roxy Manns    . amLODipine  10 mg Oral Daily  . aspirin EC  325 mg Oral Once  . aspirin EC  325 mg Oral Daily  . enoxaparin (LOVENOX) injection  40 mg Subcutaneous Daily  . pravastatin  40 mg Oral q1800     Allergies  Allergen Reactions  . Valsartan Swelling    angioedema    Social History   Social History  . Marital status: Married    Spouse name: N/A  . Number of children: N/A  . Years of education: N/A   Occupational History  .  Not on file.   Social History Main Topics  . Smoking status: Never Smoker  . Smokeless tobacco: Never Used  . Alcohol use No  . Drug use: No  . Sexual activity: Not on file   Other Topics Concern  . Not on file   Social History Narrative   Married, exercises with stationary bike 5 days per week.   Eats healthy.  As of 03/2015    Family History  Problem Relation Age of Onset  . Family history unknown: Yes    ROS- All systems are reviewed and negative except as per the HPI above  Physical Exam: Telemetry:  Sinus with PACs Vitals:   07/05/16 2158 07/06/16 0109 07/06/16 0511 07/06/16 1002  BP: 130/79 137/68 (!) 144/75 123/65  Pulse: 82 73  78 87  Resp: 16 16 16 18   Temp: 98.6 F (37 C) 98.6 F (37 C) 98.7 F (37.1 C) 98.3 F (36.8 C)  TempSrc: Oral Oral Oral Oral  SpO2: 96% 96% 95% 97%  Weight:        GEN- The patient is elderly appearing, alert   Head- normocephalic, atraumatic Eyes-  Sclera clear, conjunctiva pink Ears- hearing intact Oropharynx- clear Neck- supple,   Lungs- Clear to ausculation bilaterally, normal work of breathing Heart- Regular rate and rhythm  Extremities- no clubbing, cyanosis, or edema MS- no significant deformity or atrophy Skin- no rash or lesion Psych- euthymic mood, full affect  EKGs:  Sinus with PACs  Labs:   Lab Results  Component Value Date   WBC 9.0 07/04/2016   HGB 12.1 (L) 07/04/2016   HCT 37.0 (L) 07/04/2016   MCV 88.7 07/04/2016   PLT 278 07/04/2016    Recent Labs Lab 07/06/16 0525  NA 137  K 3.4*  CL 103  CO2 26  BUN 11  CREATININE 0.86  CALCIUM 9.2  GLUCOSE 97   Lab Results  Component Value Date   CKTOTAL 72 12/12/2007   CKMB 3.0 12/12/2007   TROPONINI <0.30 08/02/2013    Lab Results  Component Value Date   CHOL 128 07/04/2016   CHOL 124 (L) 04/02/2015   CHOL  12/12/2007    128        ATP III CLASSIFICATION:  <200     mg/dL   Desirable  200-239  mg/dL   Borderline High  >=240    mg/dL   High   Lab Results  Component Value Date   HDL 29 (L) 07/04/2016   HDL 26 (L) 04/02/2015   HDL 21 (L) 12/12/2007   Lab Results  Component Value Date   LDLCALC 84 07/04/2016   LDLCALC 81 04/02/2015   LDLCALC  12/12/2007    88        Total Cholesterol/HDL:CHD Risk Coronary Heart Disease Risk Table                     Men   Women  1/2 Average Risk   3.4   3.3   Lab Results  Component Value Date   TRIG 74 07/04/2016   TRIG 83 04/02/2015   TRIG 94 12/12/2007   Lab Results  Component Value Date   CHOLHDL 4.4 07/04/2016   CHOLHDL 4.8 04/02/2015   CHOLHDL 6.1 12/12/2007   No results found for: LDLDIRECT     Echo:    reviewed  ASSESSMENT AND  PLAN:   1. Stroke, afib, valvular heart disease The patient presents with a very challenging problem.  He has had  afib previously and presents with stroke.  He is a poor candidate for long term anticoagulation due to prior ICH.  BP continues to run very high and suggests increased risks for ICH related to BP.  He has previously felt to not be a coumadin candidate by Dr Wynonia Lawman. He has had his LAA oversewn surgically and thus is also not a candidate for Watchman LAAO device.  As we know that he has afib, an implantable loop recorder would not likely be beneficial. At this point, I would favor TEE to better evaluate his LAA (make sure there is no flow within the appendage and look for other causes of stroke).  If uneventful, then we will have to decide if he is treated with coumadin, eliquis, or avoidance of anticoagulation long term.  His chads2vasc score is 5.  I have spoken with Drs Wynonia Lawman, Leonie Man, and Maylene Roes.  This is a complicated patient.  A high level of decision making is required.    Dr Wynonia Lawman is not rounding in the hospital until after the first of the year.  General cardiology will therefore assist with the patient while here. Electrophysiology team to see as needed while here. Please call with questions.    Thompson Grayer, MD 07/06/2016  1:26 PM

## 2016-07-06 NOTE — Progress Notes (Addendum)
PROGRESS NOTE    Jack [name]  [id number] DOB: 05-Jul-1934 DOA: 07/03/2016 PCP: Crisoforo Oxford, PA-C     Brief Narrative:  Jack [name] is a 82 y.o. male with medical history significant of HTN, HLD, CVA with residual left-sided weakness, atrial fibrillation, s/p MVR; presents with complaints of right-sided weakness. Patient notes that he got up this morning around 8 AM and was able to walk with the assistance of a cane to eat his breakfast. Following breakfast he noted that he had difficulty utilizing his right arm that he uses to hold his cane. He was also noted to have weakness in right lower extremity as well. Patient has had irregular heart beat for a few years now, but has never been on anticoagulation. Takes baby aspirin. Followed by Dr. Wynonia Lawman of cardiology. In reviewing cardiology records from Dr. Wynonia Lawman from 2015 it appears that patient was thought not to have been in A. fib, question holter monitor, and therefore was deemed not to be a candidate for anticoagulation at that time. It appears patient also was noted to have history of intercranial hemorrhage .   Assessment & Plan:   Principal Problem:   Right sided weakness Active Problems:   History of atrial fibrillation   Hx of mitral valve repair   History of stroke   Essential hypertension   TIA (transient ischemic attack)   Embolic stroke involving left middle cerebral artery (HCC)   Acute CVA (cerebrovascular accident) ([place])   Left frontoparietal stroke  -History of CVA 2009 with left-sided residual weakness, history of [place] 2015 -Carotid doppler: Preliminary report:  1-39% ICA plaquing. Vertebral artery flow is antegrade.  -Stroke team, PT OT SLP  -Ha1c 5.5 -Echo with EF 60-65%, LAE, dilated Rv, severe pulmonary HTN RVSP 139mmHG  -Continue aspirin for now   Paroxysmal A Fib, currently in normal sinus -Chadsvasc score= 5. S/p Maze procedure as well as amputation of atrial appendage in the past. Had ICH and  was deemed not a candidate for anticoagulation  -Continue aspirin for now. Recommend anticoagulation with eliquis to prevent stroke, but does have hx of ICH in 2015. I discussed the risks and benefits of eliquis vs aspirin alone. Currently, he is refusing anticoagulation even if it means he could have another stroke.  -EP Dr. Rayann Heman consulted for consideration for left atrial appendage occlusion using watchman device  Essential hypertension -Amlodipine  DVT prophylaxis: lovenox Code Status: Full Family Communication: grandson at bedside  Disposition Plan: pending further work up , CIR vs SNF    Consultants:   Neurology  EP   Procedures:   None  Antimicrobials:   None    Subjective: No new complaints today. Denies any chest pain, shortness of breath, nausea or vomiting. We had an extensive discussion regarding anticoagulant again this morning. No change in his decision regarding starting anticoagulation   Objective: Vitals:   07/05/16 2158 07/06/16 0109 07/06/16 0511 07/06/16 1002  BP: 130/79 137/68 (!) 144/75 123/65  Pulse: 82 73 78 87  Resp: 16 16 16 18   Temp: 98.6 F (37 C) 98.6 F (37 C) 98.7 F (37.1 C) 98.3 F (36.8 C)  TempSrc: Oral Oral Oral Oral  SpO2: 96% 96% 95% 97%  Weight:        Intake/Output Summary (Last 24 hours) at 07/06/16 1242 Last data filed at 07/06/16 0900  Gross per 24 hour  Intake              580 ml  Output  0 ml  Net              580 ml   Filed Weights   07/03/16 1212  Weight: 66.9 kg (147 lb 8 oz)    Examination:  General exam: Appears calm and comfortable  Respiratory system: Clear to auscultation. Respiratory effort normal. Cardiovascular system: S1 & S2 heard, RRR. No JVD, murmurs, rubs, gallops or clicks. No pedal edema. Gastrointestinal system: Abdomen is nondistended, soft and nontender. No organomegaly or masses felt. Normal bowel sounds heard. Central nervous system: Alert and oriented. No focal  neurological deficits. Weak, but all limbs are equal in strength.  Extremities: Symmetric 5 x 5 power. Skin: No rashes, lesions or ulcers Psychiatry: Judgement and insight appear normal. Mood & affect appropriate.   Data Reviewed: I have personally reviewed following labs and imaging studies  CBC:  Recent Labs Lab 07/03/16 1400 07/04/16 0220  WBC 8.0 9.0  NEUTROABS 5.8  --   HGB 12.9* 12.1*  HCT 38.9* 37.0*  MCV 89.2 88.7  PLT 295 0000000   Basic Metabolic Panel:  Recent Labs Lab 07/03/16 1400 07/04/16 0220 07/05/16 1141 07/06/16 0525  NA 138 139 135 137  K 3.7 3.4* 3.5 3.4*  CL 102 106 102 103  CO2 28 26 27 26   GLUCOSE 100* 83 115* 97  BUN 14 10 10 11   CREATININE 0.82 0.78 0.82 0.86  CALCIUM 9.1 8.7* 8.8* 9.2  MG  --  1.9  --   --    GFR: CrCl cannot be calculated (Unknown ideal weight.). Liver Function Tests: No results for input(s): AST, ALT, ALKPHOS, BILITOT, PROT, ALBUMIN in the last 168 hours. No results for input(s): LIPASE, AMYLASE in the last 168 hours. No results for input(s): AMMONIA in the last 168 hours. Coagulation Profile: No results for input(s): INR, PROTIME in the last 168 hours. Cardiac Enzymes: No results for input(s): CKTOTAL, CKMB, CKMBINDEX, TROPONINI in the last 168 hours. BNP (last 3 results) No results for input(s): PROBNP in the last 8760 hours. HbA1C:  Recent Labs  07/04/16 0220  HGBA1C 5.5   CBG: No results for input(s): GLUCAP in the last 168 hours. Lipid Profile:  Recent Labs  07/04/16 0220  CHOL 128  HDL 29*  LDLCALC 84  TRIG 74  CHOLHDL 4.4   Thyroid Function Tests: No results for input(s): TSH, T4TOTAL, FREET4, T3FREE, THYROIDAB in the last 72 hours. Anemia Panel: No results for input(s): VITAMINB12, FOLATE, FERRITIN, TIBC, IRON, RETICCTPCT in the last 72 hours. Sepsis Labs: No results for input(s): PROCALCITON, LATICACIDVEN in the last 168 hours.  No results found for this or any previous visit (from the past  240 hour(s)).     Radiology Studies: No results found.    Scheduled Meds: . amLODipine  10 mg Oral Daily  . aspirin EC  325 mg Oral Once  . aspirin EC  325 mg Oral Daily  . enoxaparin (LOVENOX) injection  40 mg Subcutaneous Daily  . pravastatin  40 mg Oral q1800   Continuous Infusions:    LOS: 1 day    Time spent: 30 minutes   Dessa Phi, DO [facility] [email or web address] Password [facility] 07/06/2016, 12:42 PM

## 2016-07-06 NOTE — Consult Note (Signed)
Physical Medicine and Rehabilitation Consult   Reason for Consult: Right sided weakness superimposed on left hemiparesis Referring Physician: Dr. Maylene Roes   HPI: Jack [name] is a 82 y.o. LH-male with history of A fib, PFO, hemorrhagic CVA with spastic left hemiparesis, memory deficits and gait disorder, HTN, who was admitted on 07/03/16 with acute onset of right sided weakness. MRI/MRA brain done revealing small acute cortical/subcortical infarcts in left frontoparietal region, acute right occipital lobe infarcts and chronic cerebral and cerebellar infarcts. Carotid dopplers without significant ICA stenosis. BLE dopplers negative for DVT. 2D echo with EF 60-65% with severely dilated LA, moderate TVR and mild LVH. Dr. Leonie Man evaluated patient (who has been hesitant to start anticoagulation despite multiple strokes) and recommended Eliquis for cardio embolic stroke.  Patient reports that he is not interested in anticoagulation at this time and would like to discuss this with cardiology prior to starting medication. ST evaluation with baseline STM and problem solving deficits--no follow up needed. PT/OT evaluation done showing flexed posture with narrow BOS, LLE instability, left lean and inability to carry out ADL tasks. CIR recommended by MD and rehab team.   He was independent without AD in the home--uses cane out of home. Goes to the gym 4 days a week--does bike, treadmill and few weights for total of 60 minutes.    Review of Systems  HENT: Positive for hearing loss. Negative for ear pain and tinnitus.   Eyes: Positive for blurred vision (poor vision). Negative for double vision.  Respiratory: Positive for wheezing. Negative for cough and shortness of breath.   Cardiovascular: Negative for chest pain and palpitations.  Gastrointestinal: Positive for constipation and heartburn (pretty bad).  Genitourinary: Positive for frequency.  Musculoskeletal: Negative for back pain, joint pain,  myalgias and neck pain.  Skin: Negative for itching and rash.  Neurological: Positive for sensory change (left sided numbness) and focal weakness. Negative for dizziness and headaches.  Psychiatric/Behavioral: The patient has insomnia (due to LLE spasticity).   All other systems reviewed and are negative.   Past Medical History:  Diagnosis Date  . Atrial fibrillation ([place])    Dr. Tollie Eth  . BPH (benign prostatic hypertrophy) 2012   prior therapy with finasteride, but as of 03/2015 no c/o; Dr. Luanne Bras  . Cellulitis of face 11/08   [facility]  . CVA (cerebral infarction) 2009, 2015  . Echocardiogram abnormal 02/11/2010   LVEF 70%, severe mitral regurgitation, pulm HTN   . ED (erectile dysfunction)    Viagra prn, consult with Urology 2012  . Elevated PSA 2012   BPH; Dr. Luanne Bras, [facility]; no more PSAs needed as of 2012 consult  . Gait abnormality    uses cane, s/p CVA  . Hemiparesis affecting left side as late effect of cerebrovascular accident ([place])   . Hiatal hernia    per 2008 EUS  . Hyperlipidemia   . Hypertension   . Microscopic hematuria    chronic, eval 2012 with Urology, benign at that time  . Obstructive uropathy 2012   Dr. Luanne Bras  . Pancreatic mass 01/2006   inconsequential per CT 2007  . 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 Patent foramen ovale    hx/o, notation of fistula on echocardiogram from 2009  . Prophylactic antibiotic 2016   requires due to valve disease  . Schatzki's ring 2008   per endoscopic ultrasound; Dr. Janetta Hora. [name]  . Stroke ([place])   .  Ventral hernia    surgery consult 01/2010 with Dr. Excell Seltzer, elective repair if desired , but he declined  . Wears hearing aid     Past Surgical History:  Procedure Laterality Date  . CARDIAC CATHETERIZATION  02/2010   LVEF 70%, severe mitral regurgitation, pulm HTN; prior in 01/2006  . INGUINAL HERNIA  REPAIR     bilat  . MITRAL VALVE REPAIR  01/2006   mitral repair with Maze procedure, tricuspid annuloplasty, Dr. Roxy Manns    Family History  Problem Relation Age of Onset  . Family history unknown: Yes    Social History:   Married--wife at home and can provide assistance after discharge.  Retired from [employer]. He reports that he has never smoked. He has never used smokeless tobacco. He reports that he does not drink alcohol or use drugs.     Allergies  Allergen Reactions  . Valsartan Swelling    angioedema   Medications Prior to Admission  Medication Sig Dispense Refill  . amLODipine (NORVASC) 10 MG tablet Take 1 tablet (10 mg total) by mouth daily. 90 tablet 3  . aspirin EC 81 MG EC tablet Take 1 tablet (81 mg total) by mouth daily. 90 tablet 1  . Cholecalciferol (VITAMIN D3) 3000 UNITS TABS Take by mouth.    Marland Kitchen MAGNESIUM PO Take by mouth daily.    Marland Kitchen acetaminophen (TYLENOL) 325 MG tablet Take 1-2 tablets (325-650 mg total) by mouth every 4 (four) hours as needed for mild pain. (Patient not taking: Reported on 07/03/2016)    . hydrochlorothiazide (HYDRODIURIL) 25 MG tablet TAKE 1 TABLET BY MOUTH  DAILY 90 tablet 0  . pravastatin (PRAVACHOL) 20 MG tablet Take 1 tablet (20 mg total) by mouth daily. 90 tablet 1    Home: Home Living Family/patient expects to be discharged to:: Private residence Living Arrangements: Spouse/significant other Available Help at Discharge: Family Type of Home: House Home Access: Level entry Home Layout: Multi-level, Bed/bath upstairs, 1/2 bath on main level Alternate Level Stairs-Number of Steps: 2 sets of 7 steps  Alternate Level Stairs-Rails: Right Bathroom Shower/Tub: Tub/shower unit, Door, Multimedia programmer: Standard Bathroom Accessibility: Yes Home Equipment: Kasandra Knudsen - quad, Cane - single point, Environmental consultant - 2 wheels  Lives With: Spouse  Functional History: Prior Function Level of Independence: Independent with assistive  device(s) Comments: independent without A.D. in house and with [facility] in community. Goes to the gym with his wife 2 x week to walk. Community ambulator. Functional Status:  Mobility: Bed Mobility Overal bed mobility: Needs Assistance Bed Mobility: Supine to Sit Supine to sit: Supervision, HOB elevated Sit to supine: Min guard, HOB elevated General bed mobility comments: +rail, increased time to complete Transfers Overall transfer level: Needs assistance Equipment used: Rolling walker (2 wheeled) Transfers: Sit to/from Stand Sit to Stand: Min assist Stand pivot transfers: Min assist General transfer comment: verbal cues for hand placement, assist to power up; Cues to push feet into floor for greater stance stability Ambulation/Gait Ambulation/Gait assistance: Min assist Ambulation Distance (Feet): 85 Feet Assistive device: Rolling walker (2 wheeled) Gait Pattern/deviations: Decreased stride length, Trunk flexed, Narrow base of support General Gait Details: verbal and tactile cues for posture, left lateral lean; focused on incr L stance stability with cues and tactile facilitation to activate L knee and hip extensors, and extend hip and knee in L stance; better R step length with facilitation as well Gait velocity: decreased Gait velocity interpretation: Below normal speed for age/gender    ADL:  ADL Overall ADL's : Needs assistance/impaired Eating/Feeding: Set up Eating/Feeding Details (indicate cue type and reason): issued red tubing to help with holding utnesil Grooming: Set up, Supervision/safety, Sitting Upper Body Bathing: Minimal assistance, Sitting Upper Body Bathing Details (indicate cue type and reason): Pt unable to maintain static sitting balance when engaged in functional task using BUE. Falls to L Lower Body Bathing: Moderate assistance, Sit to/from stand Upper Body Dressing : Moderate assistance, Sitting Lower Body Dressing: Moderate assistance, Sit to/from stand Toilet  Transfer: Minimal assistance, Stand-pivot Toileting- Clothing Manipulation and Hygiene: Moderate assistance, Sit to/from stand Functional mobility during ADLs: Minimal assistance (stand pivot only today)  Cognition: Cognition Overall Cognitive Status: History of cognitive impairments - at baseline Arousal/Alertness: Awake/alert Orientation Level: Oriented X4 Attention: Focused Focused Attention: Appears intact Memory: Impaired Memory Impairment: Retrieval deficit Awareness: Impaired Awareness Impairment: Emergent impairment Cognition Arousal/Alertness: Awake/alert Behavior During Therapy: WFL for tasks assessed/performed Overall Cognitive Status: History of cognitive impairments - at baseline General Comments: labile   Blood pressure 123/65, pulse 87, temperature 98.3 F (36.8 C), temperature source Oral, resp. rate 18, weight 66.9 kg (147 lb 8 oz), SpO2 97 %. Physical Exam  Nursing note and vitals reviewed. Constitutional: He is oriented to person, place, and time. He appears well-developed and well-nourished.  HENT:  Head: Normocephalic and atraumatic.  Mouth/Throat: Oropharynx is clear and moist.  Eyes: Conjunctivae and EOM are normal. Pupils are equal, round, and reactive to light.  Neck: Normal range of motion. Neck supple. No tracheal deviation present. No thyromegaly present.  Cardiovascular: Normal rate and regular rhythm.   Respiratory: Effort normal. He has wheezes.  GI: Soft. Bowel sounds are normal. He exhibits no distension. There is no tenderness.  Musculoskeletal: He exhibits no edema or tenderness.  Neurological: He is alert and oriented to person, place, and time.  HOH.  Soft, slow speech.  Able to follow basic motor commands.  Sensation diminished to light touch LUE/LLE.  Motor: RUE/RLE: 4/5 proximal to distal LUE/LLE: 4-/5 proximal to distal Ataxia R>L DTRs 3+ LUE/LLE  Skin: Skin is warm and dry.  Psychiatric: His mood appears anxious. His speech is  delayed. He is slowed.    Results for orders placed or performed during the hospital encounter of 07/03/16 (from the past 24 hour(s))  Basic metabolic panel     Status: Abnormal   Collection Time: 07/05/16 11:41 AM  Result Value Ref Range   Sodium 135 135 - 145 mmol/L   Potassium 3.5 3.5 - 5.1 mmol/L   Chloride 102 101 - 111 mmol/L   CO2 27 22 - 32 mmol/L   Glucose, Bld 115 (H) 65 - 99 mg/dL   BUN 10 6 - 20 mg/dL   Creatinine, Ser 0.82 0.61 - 1.24 mg/dL   Calcium 8.8 (L) 8.9 - 10.3 mg/dL   GFR calc non Af Amer >60 >60 mL/min   GFR calc Af Amer >60 >60 mL/min   Anion gap 6 5 - 15  Basic metabolic panel     Status: Abnormal   Collection Time: 07/06/16  5:25 AM  Result Value Ref Range   Sodium 137 135 - 145 mmol/L   Potassium 3.4 (L) 3.5 - 5.1 mmol/L   Chloride 103 101 - 111 mmol/L   CO2 26 22 - 32 mmol/L   Glucose, Bld 97 65 - 99 mg/dL   BUN 11 6 - 20 mg/dL   Creatinine, Ser 0.86 0.61 - 1.24 mg/dL   Calcium 9.2 8.9 - 10.3 mg/dL  GFR calc non Af Amer >60 >60 mL/min   GFR calc Af Amer >60 >60 mL/min   Anion gap 8 5 - 15   Mr Brain Wo Contrast  Result Date: 07/04/2016 CLINICAL DATA:  Right-sided weakness. EXAM: MRI HEAD WITHOUT CONTRAST MRA HEAD WITHOUT CONTRAST TECHNIQUE: Multiplanar, multiecho pulse sequences of the brain and surrounding structures were obtained without intravenous contrast. Angiographic images of the head were obtained using MRA technique without contrast. COMPARISON:  Head CT 07/03/2016.  Head MRI/ MRA 12/12/2007. FINDINGS: MRI HEAD FINDINGS The examination had to be discontinued prior to completion due to patient vomiting. Sagittal T1, axial and coronal diffusion, axial T2, and axial FLAIR sequences were obtained. The the FLAIR sequence is severely motion degraded and nondiagnostic. Sagittal T1 and axial T2 sequences are mildly motion degraded. Brain: There are small foci of acute cortical and subcortical infarction in the high left frontoparietal region  extending inferiorly into the centrum semiovale. There are also 2 punctate foci of acute cortical infarction in the right occipital lobe. Bilateral cerebral white matter T2 hyperintensities have progressed from 2009 and are compatible with chronic small vessel ischemic disease. Chronic right frontoparietal and left parieto-occipital infarcts are new from 2009. There is moderate cerebral atrophy. Small chronic infarcts are present in the thalami and left cerebellum, with the thalamic infarcts new from 2009. No gross intracranial hemorrhage, mass, midline shift, or extra-axial fluid collection is seen. Vascular: Grossly preserved flow voids. Skull and upper cervical spine: No destructive osseous lesion. Sinuses/Orbits: Grossly unremarkable orbits. No significant sinus disease. Other: None. MRA HEAD FINDINGS The study is mildly motion degraded. The visualized distal vertebral arteries are patent to the basilar with the right being minimally larger than the left. There is focal narrowing versus flow artifact involving the distal left vertebral artery at the vertebrobasilar junction. PICAs and SCAs appear patent proximally with atherosclerotic narrowing versus artifact in the left SCA. The basilar artery is widely patent. There is a patent left posterior communicating artery. PCAs are patent without evidence of significant stenosis. The internal carotid arteries are widely patent from skullbase to carotid termini. ACAs and MCAs are patent without evidence of significant proximal stenosis. Detailed branch vessel evaluation is limited by motion artifact. No intracranial aneurysm is identified. IMPRESSION: 1. Incomplete, motion degraded examination as above. 2. Small acute cortical/subcortical infarcts in the high left frontoparietal region. 3. Punctate acute right occipital lobe cortical infarcts. 4. Chronic cerebral and cerebellar infarcts as above. 5. No evidence of major intracranial arterial occlusion or significant  proximal stenosis. Electronically Signed   By: Logan Bores M.D.   On: 07/04/2016 11:58   [name] Head/brain [id number] Cm  Result Date: 07/04/2016 CLINICAL DATA:  Right-sided weakness. EXAM: MRI HEAD WITHOUT CONTRAST MRA HEAD WITHOUT CONTRAST TECHNIQUE: Multiplanar, multiecho pulse sequences of the brain and surrounding structures were obtained without intravenous contrast. Angiographic images of the head were obtained using MRA technique without contrast. COMPARISON:  Head CT 07/03/2016.  Head MRI/ MRA 12/12/2007. FINDINGS: MRI HEAD FINDINGS The examination had to be discontinued prior to completion due to patient vomiting. Sagittal T1, axial and coronal diffusion, axial T2, and axial FLAIR sequences were obtained. The the FLAIR sequence is severely motion degraded and nondiagnostic. Sagittal T1 and axial T2 sequences are mildly motion degraded. Brain: There are small foci of acute cortical and subcortical infarction in the high left frontoparietal region extending inferiorly into the centrum semiovale. There are also 2 punctate foci of acute cortical infarction in the right occipital lobe.  Bilateral cerebral white matter T2 hyperintensities have progressed from 2009 and are compatible with chronic small vessel ischemic disease. Chronic right frontoparietal and left parieto-occipital infarcts are new from 2009. There is moderate cerebral atrophy. Small chronic infarcts are present in the thalami and left cerebellum, with the thalamic infarcts new from 2009. No gross intracranial hemorrhage, mass, midline shift, or extra-axial fluid collection is seen. Vascular: Grossly preserved flow voids. Skull and upper cervical spine: No destructive osseous lesion. Sinuses/Orbits: Grossly unremarkable orbits. No significant sinus disease. Other: None. MRA HEAD FINDINGS The study is mildly motion degraded. The visualized distal vertebral arteries are patent to the basilar with the right being minimally larger than the left. There  is focal narrowing versus flow artifact involving the distal left vertebral artery at the vertebrobasilar junction. PICAs and SCAs appear patent proximally with atherosclerotic narrowing versus artifact in the left SCA. The basilar artery is widely patent. There is a patent left posterior communicating artery. PCAs are patent without evidence of significant stenosis. The internal carotid arteries are widely patent from skullbase to carotid termini. ACAs and MCAs are patent without evidence of significant proximal stenosis. Detailed branch vessel evaluation is limited by motion artifact. No intracranial aneurysm is identified. IMPRESSION: 1. Incomplete, motion degraded examination as above. 2. Small acute cortical/subcortical infarcts in the high left frontoparietal region. 3. Punctate acute right occipital lobe cortical infarcts. 4. Chronic cerebral and cerebellar infarcts as above. 5. No evidence of major intracranial arterial occlusion or significant proximal stenosis. Electronically Signed   By: Logan Bores M.D.   On: 07/04/2016 11:58    Assessment/Plan: Diagnosis: Acute cortical/subcortical infarcts in left frontoparietal region, acute right occipital lobe infarcts Labs and images independently reviewed.  Records reviewed and summated above. Stroke: Continue secondary stroke prophylaxis and Risk Factor Modification listed below:   Antiplatelet therapy:   Blood Pressure Management:  Continue current medication with prn's with permisive HTN per primary team Statin Agent:   L>R sided hemiparesis: fit for orthosis to prevent contractures (resting hand splint for day, wrist cock up splint at night, PRAFO, etc)  Motor recovery: Fluoxetine   1. Does the need for close, 24 hr/day medical supervision in concert with the patient's rehab needs make it unreasonable for this patient to be served in a less intensive setting? Yes  2. Co-Morbidities requiring supervision/potential complications: A fib (monitor  HR with increased physical activity), PFO, hemorrhagic CVA with spastic left hemiparesis, memory deficits and gait disorder, HTN (monitor and provide prns in accordance with increased physical exertion and pain), medication non-compliance (encourage importance of anticoagulation), hypokalemia (continue to monitor and replete as necessary), ABLA (transfuse if necessary to ensure appropriate perfusion for increased activity tolerance) 3. Due to bladder management, safety, disease management, medication administration and patient education, does the patient require 24 hr/day rehab nursing? Yes 4. Does the patient require coordinated care of a physician, rehab nurse, PT (1-2 hrs/day, 5 days/week) and OT (1-2 hrs/day, 5 days/week) to address physical and functional deficits in the context of the above medical diagnosis(es)? Yes Addressing deficits in the following areas: balance, endurance, locomotion, strength, transferring, bathing, dressing, toileting and psychosocial support 5. Can the patient actively participate in an intensive therapy program of at least 3 hrs of therapy per day at least 5 days per week? Yes 6. The potential for patient to make measurable gains while on inpatient rehab is excellent 7. Anticipated functional outcomes upon discharge from inpatient rehab are modified independent and supervision  with PT, modified independent and  supervision with OT, n/a with SLP. 8. Estimated rehab length of stay to reach the above functional goals is: 12-16 days. 9. Does the patient have adequate social supports and living environment to accommodate these discharge functional goals? Yes 10. Anticipated D/C setting: Home 11. Anticipated post D/C treatments: HH therapy and Home excercise program 12. Overall Rehab/Functional Prognosis: good and fair  RECOMMENDATIONS: This patient's condition is appropriate for continued rehabilitative care in the following setting: CIR Patient has agreed to participate  in recommended program. Yes Note that insurance prior authorization may be required for reimbursement for recommended care.  Comment: Rehab Admissions Coordinator to follow up.   Flora Lipps 07/06/2016  Delice Lesch, MD, [facility]

## 2016-07-06 NOTE — Progress Notes (Signed)
STROKE TEAM PROGRESS NOTE   HISTORY OF PRESENT ILLNESS (per record) Jack [name] is an 82 y.o. 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 Deficit was present when he woke up this morning. He was last known well at 10:00 last night. His wife noted difficulty with ambulation with coordination of his right lower extremity as well as coordination of his right upper extremity using his cane. CT scan of his head showed no acute changes. MRI is pending. NIH stroke score at the time of this evaluation was 2. Patient was LKW 10:00 PM on 05/02/2016. tPA was not given as beyond time under for treatment consideration when he arrived in the ED.    SUBJECTIVE (INTERVAL HISTORY)  Patient is setting up in bedside chair.  He has had multiple strokes and had a hemorrhagic stroke in 2015 and has been reluctant to consider anticoagulation despite coming back with recurrent strokes over the years.Discuss with Dr. Rayann Heman. Patient had mitral valvuloplasty in 2007 and a maze procedure as well. He may not be a candidate for left atrial appendage occlusion    OBJECTIVE Temp:  [98.1 F (36.7 C)-98.7 F (37.1 C)] 98.1 F (36.7 C) (12/26 1354) Pulse Rate:  [73-87] 86 (12/26 1354) Cardiac Rhythm: Heart block (12/26 0700) Resp:  [16-18] 18 (12/26 1354) BP: (123-144)/(64-79) 125/64 (12/26 1354) SpO2:  [95 %-98 %] 98 % (12/26 1354)  CBC:   Recent Labs Lab 07/03/16 1400 07/04/16 0220  WBC 8.0 9.0  NEUTROABS 5.8  --   HGB 12.9* 12.1*  HCT 38.9* 37.0*  MCV 89.2 88.7  PLT 295 0000000    Basic Metabolic Panel:   Recent Labs Lab 07/04/16 0220 07/05/16 1141 07/06/16 0525  NA 139 135 137  K 3.4* 3.5 3.4*  CL 106 102 103  CO2 26 27 26   GLUCOSE 83 115* 97  BUN 10 10 11   CREATININE 0.78 0.82 0.86  CALCIUM 8.7* 8.8* 9.2  MG 1.9  --    --     Lipid Panel:     Component Value Date/Time   CHOL 128 07/04/2016 0220   TRIG 74 07/04/2016 0220   HDL 29 (L) 07/04/2016 0220   CHOLHDL 4.4 07/04/2016 0220   VLDL 15 07/04/2016 0220   LDLCALC 84 07/04/2016 0220   HgbA1c:  Lab Results  Component Value Date   HGBA1C 5.5 07/04/2016   Urine Drug Screen:     Component Value Date/Time   LABOPIA NONE DETECTED 08/02/2013 1206   COCAINSCRNUR NONE DETECTED 08/02/2013 1206   LABBENZ NONE DETECTED 08/02/2013 1206   AMPHETMU NONE DETECTED 08/02/2013 1206   THCU NONE DETECTED 08/02/2013 1206   LABBARB NONE DETECTED 08/02/2013 1206      IMAGING  Ct Head Wo Contrast 07/03/2016 Atrophy with small vessel chronic ischemic changes of deep cerebral white matter. Multiple old infarcts as above. No acute intracranial abnormalities.   Mri / Mra Head/brain Wo Cm 07/04/2016 1. Incomplete, motion degraded examination as above.  2. Small acute cortical/subcortical infarcts in the high left frontoparietal region.  3. Punctate acute right occipital lobe cortical infarcts.  4. Chronic cerebral and cerebellar infarcts as above.  5. No evidence of major intracranial arterial occlusion or significant proximal stenosis.   Lower extremity venous duplex  No evidence of DVT or baker's cyst.   PHYSICAL EXAM HEENT-  Normocephalic,  no lesions, without obvious abnormality.  Normal external eye and conjunctiva.  Normal TM's bilaterally.  Normal auditory canals and external ears. Normal external nose, mucus membranes and septum.  Normal pharynx. Neck supple with no masses, nodes, nodules or enlargement. Cardiovascular - regularly irregular rhythm, S1, S2 normal and no S3 or S4 Lungs - chest clear, no wheezing, rales, normal symmetric air entry Abdomen - soft, non-tender; bowel sounds normal; no masses,  no organomegaly Extremities - no joint deformities, effusion, or inflammation  Neurologic Examination: Mental Status: Alert, oriented to name,  date, location, situation, no acute distress.  Speech fluent without evidence of aphasia. Able to follow commands without difficulty. Cranial Nerves: II-Visual fields were normal. III/IV/VI-Pupils were equal and reacted normally to light. Extraocular movements were full and conjugate. V/VII-no facial numbness and no facial weakness. VIII: Significantly impaired hearing X-normal speech. XI: trapezius strength/neck flexion strength normal bilaterally XII-midline tongue extension with normal strength. Motor: Significant weakness of right grip and intrinsic hand muscles. Fine finger movements are diminished on the right compared to the left. Orbits left over right upper extremity. mild left hemiparesis from previous stroke. No Drift of the right arm or right leg.  Sensory: Patient endorses symmetric sensation to light touch Deep Tendon Reflexes:  symmetric. Plantars: Flexor bilaterally Cerebellar: mild dysmetria noted FTN on the left UE but intact in the other limbs Gait: Deferred    ASSESSMENT/PLAN Jack [name] is a 82 y.o. male with history of a stroke 2 years ago with residual left hemiparesis, PFO, hypertension, hyperlipidemia and atrial fibrillation not on anticoagulation  presenting with new onset right-sided weakness. He did not receive IV t-PA due to late presentation.  Multiple Strokes:  Dominant infarct felt to be embolic secondary to atrial fibrillation without anticoagulation. 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 Chronic cerebral and cerebellar infarcts.  MRA - No evidence of major intracranial arterial occlusion or significant proximal stenosis.   Bilateral L E venous duplex - negative for DVT  Carotid Doppler - 1-39% ICA plaquing. Vertebral artery flow is antegrade.   2D  Echo - pending  LDL - 84  HgbA1c pending  VTE prophylaxis - Lovenox Diet Heart Room service appropriate? Yes; Fluid consistency: Thin  aspirin 81 mg daily prior to admission, now on aspirin 325 mg daily  Patient counseled to be compliant with his antithrombotic medications  Ongoing aggressive stroke risk factor management  Atrial fibrillation - recommend anticoagulation (cardiologist - Dr Wynonia Lawman) [place]. Discussed with his attending Dr. Maylene Roes, she will call his cardiologist and discuss with patient and family.  Therapy recommendations:  pending  Disposition: Pending  Atrial Fibrillation  Home anticoagulation:  aspirin 81 mg daily continued in the hospital  CHA2DS2-VASc Score = 5, ?2 oral anticoagulation recommended    Continue aspirin at discharge a snot a good antiocoagulation candidate long term due to h/o ICH. Consider left atrial occlusion with watchman device   Hypertension  Stable  Permissive hypertension (OK if < 220/120) but gradually normalize in 5-7 days  Long-term BP goal normotensive  Hyperlipidemia  Home meds:  Pravachol 20 mg daily not resumed in hospital  LDL 84, goal < 70  Increase Pravachol to 40 mg daily  Continue statin at discharge  Other Stroke Risk Factors  Advanced age  Overweight, Body mass index is 26.98 kg/m., recommend weight loss, diet and  exercise as appropriate   Hx stroke/TIA  Atrial Fibrillation without anticoagulation.  Other Active Problems  PFO hx ->   lower extremity venous Dopplers negative  Atrial fibrillation - recommend  Watchman device (cardiologist - Dr Wynonia Lawman) [place]. Discussed with his attending Dr. Maylene Roes, she will call his cardiologist and discuss with patient and family.    previous history of [place] I jan 2015 participated in [facility] 2 study  Mild hypokalemia - 3.4 (supplemented)  Hospital day # 1  I have personally examined this patient, reviewed notes, independently viewed imaging studies, participated in  medical decision making and plan of care.ROS completed by me personally and pertinent positives fully documented  I have made any additions or clarifications directly to the above note.  Patient has presented with right hand weakness due to embolic left frontal MCA branch infarcts from atrial fibrillation. He however is not a good long-term anticoagulation candidate given history of thalamic hemorrhage in January 2015. I agree with continuing aspirin but   I have d/w Dr. Rayann Heman who feels patient is not a candidate for left atrial appendage occlusion. Recommend checking a TEE to look for other causes of cardiac source of embolism.. Greater than 50% time during the study 25 minute visit was spent on counseling and coordination of care about his atrial fibrillation, stroke and intracerebral hemorrhage and risk, anticoagulation and answered questions discuss with Dr. Rayann Heman and Melvenia Needles, MD Medical Director [place] Pager: [phone] 07/06/2016 2:44 PM   To contact Stroke Continuity provider, please refer to [email or web address] After hours, contact General Neurology

## 2016-07-06 NOTE — Progress Notes (Signed)
Occupational Therapy Treatment Patient Details Name: Jack [name] MRN: [id number] DOB: 1934-01-02 Today's Date: 07/06/2016    History of present illness 82 y.o. 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  MRI revealed small acute cortical/subcortical infarcts in the high left frontoparietal region and punctate acute right occipital lobe cortical infarcts.    OT comments  Focus of session on toileting, standing grooming and self feeding with AE. Pt with good participation in session, highly motivated to return to his PLOF. Demonstrates decreased short term memory. Pt requiring min assist for standing activities to set up and for balance with L LE weakness.  Follow Up Recommendations  CIR;Supervision/Assistance - 24 hour    Equipment Recommendations  3 in 1 bedside commode    Recommendations for Other Services      Precautions / Restrictions Precautions Precautions: Fall       Mobility Bed Mobility               General bed mobility comments: pt in chair  Transfers Overall transfer level: Needs assistance Equipment used: Rolling walker (2 wheeled) Transfers: Sit to/from Stand Sit to Stand: Min assist         General transfer comment: verbal cues for hand placement, assist to rise and gain balance    Balance Overall balance assessment: Needs assistance Sitting-balance support: Feet supported Sitting balance-Leahy Scale: Fair Sitting balance - Comments: Leans L Postural control: Posterior lean;Left lateral lean Standing balance support: Single extremity supported Standing balance-Leahy Scale: Poor                     ADL Overall ADL's : Needs assistance/impaired Eating/Feeding: Minimal assistance;Sitting Eating/Feeding Details (indicate cue type and reason): assist  to Grooming: Minimal assistance;Standing;Wash/dry hands;Wash/dry face;Brushing hair           Upper Body Dressing : Moderate assistance;Sitting       Toilet Transfer: Minimal assistance;Ambulation;RW;BSC (over toilet)   Toileting- Clothing Manipulation and Hygiene: Moderate assistance;Sit to/from stand       Functional mobility during ADLs: Minimal assistance;Cueing for safety;Cueing for sequencing;Rolling walker        Vision                     Perception     Praxis      Cognition   Behavior During Therapy: [facility] [employer] for tasks assessed/performed Overall Cognitive Status: History of cognitive impairments - at baseline                  General Comments: pt not able to recall function of foam build up left in his room by previous OT, reeducated.    Extremity/Trunk Assessment               Exercises    Shoulder Instructions       General Comments      Pertinent Vitals/ Pain       Pain Assessment: No/denies pain Pain Score: 0-No pain  Home Living                                          Prior Functioning/Environment              Frequency  Min 2X/week  Progress Toward Goals  OT Goals(current goals can now be found in the care plan section)  Progress towards OT goals: Progressing toward goals  Acute Rehab OT Goals Patient Stated Goal: independence Time For Goal Achievement: 07/21/15 Potential to Achieve Goals: Good  Plan Discharge plan remains appropriate    Co-evaluation                 End of Session Equipment Utilized During Treatment: Gait belt;Rolling walker (foam build up)   Activity Tolerance Patient tolerated treatment well   Patient Left in chair;with call bell/phone within reach;with chair alarm set;with family/visitor present   Nurse Communication          Time: [id number] OT Time Calculation (min): 19 min  Charges: OT General Charges $OT Visit: 1 Procedure OT  Treatments $Self Care/Home Management : 8-22 mins  Malka So 07/06/2016, 12:36 PM  [phone]

## 2016-07-06 NOTE — Progress Notes (Signed)
Physical Therapy Treatment Patient Details Name: MAJOUR LAMM MRN: [id number] DOB: 23-Jan-1934 Today's Date: 07/06/2016    History of Present Illness 82 y.o. 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  MRI revealed small acute cortical/subcortical infarcts in the high left frontoparietal region and punctate acute right occipital lobe cortical infarcts.     PT Comments    Making progress towards goals of increasing gait stability and efficiency, decreasing fall risk; participating well with gait training and therex well -- he seemed pleased with working more; needs close guard of L LE in stance due to incr risk of buckling; Pt and wife tell me they had a good experience at CIR quite a while ago (prior to 2010); given good participation/motivation, good dispo, good previous experience on CIR, Continue to recommend comprehensive inpatient rehab (CIR) for post-acute therapy needs.   Follow Up Recommendations  CIR;Supervision/Assistance - 24 hour     Equipment Recommendations  None recommended by PT    Recommendations for Other Services Rehab consult     Precautions / Restrictions Precautions Precautions: Fall    Mobility  Bed Mobility                  Transfers Overall transfer level: Needs assistance Equipment used: Rolling walker (2 wheeled) Transfers: Sit to/from Stand Sit to Stand: Min assist         General transfer comment: verbal cues for hand placement, assist to power up; Cues to push feet into floor for greater stance stability  Ambulation/Gait Ambulation/Gait assistance: Min assist Ambulation Distance (Feet): 85 Feet Assistive device: Rolling walker (2 wheeled) Gait Pattern/deviations: Decreased stride length;Trunk flexed;Narrow base of support Gait velocity: decreased    General Gait Details: verbal and tactile cues for posture, left lateral lean; focused on incr L stance stability with cues and tactile facilitation to activate L knee and hip extensors, and extend hip and knee in L stance; better R step length with facilitation as well   Stairs            Wheelchair Mobility    Modified Rankin (Stroke Patients Only) Modified Rankin (Stroke Patients Only) Pre-Morbid Rankin Score: Slight disability Modified Rankin: Moderately severe disability     Balance Overall balance assessment: Needs assistance Sitting-balance support: Feet supported Sitting balance-Leahy Scale: Fair Sitting balance - Comments: Leans L   Standing balance support: Bilateral upper extremity supported;During functional activity Standing balance-Leahy Scale: Poor                      Cognition Arousal/Alertness: Awake/alert Behavior During Therapy: WFL for tasks assessed/performed Overall Cognitive Status: History of cognitive impairments - at baseline                      Exercises Other Exercises Other Exercises: Frenckel's exercises aimed at coordination: alternating LAQ x 20; and alternating heel to shin x 20    General Comments        Pertinent Vitals/Pain Pain Assessment: No/denies pain Pain Score: 0-No pain    Home Living                      Prior Function            PT Goals (current goals can now be found in the care plan section)  Acute Rehab PT Goals Patient Stated Goal: independence PT Goal Formulation: With patient/family Time For Goal Achievement: 07/18/16 Potential to Achieve Goals: Good Progress towards PT goals: Progressing toward goals    Frequency    Min 4X/week      PT Plan Current plan remains appropriate    Co-evaluation             End of Session Equipment Utilized During Treatment: Gait belt Activity Tolerance: Patient tolerated treatment well Patient left: in chair;with call bell/phone  within reach;with family/visitor present     Time: [id number] PT Time Calculation (min) (ACUTE ONLY): 36 min  Charges:  $Gait Training: 8-22 mins $Therapeutic Exercise: 8-22 mins                    G Codes:      Colletta [place] 08-Jul-2016, 10:25 AM  Roney Marion, [place] Pager [phone] Office [phone]

## 2016-07-06 NOTE — Progress Notes (Signed)
    [facility] has been requested to perform a transesophageal echocardiogram on Jack [name] for 07/07/16.  After careful review of history and examination, the risks and benefits of transesophageal echocardiogram have been explained including risks of esophageal damage, perforation (1:10,000 risk), bleeding, pharyngeal hematoma as well as other potential complications associated with conscious sedation including aspiration, arrhythmia, respiratory failure and death. Alternatives to treatment were discussed, questions were answered. Patient is willing to proceed.   Baldwin Jamaica, PA-C  07/06/2016 1:43 PM

## 2016-07-07 ENCOUNTER — Other Ambulatory Visit (HOSPITAL_COMMUNITY): Payer: Medicare Other

## 2016-07-07 ENCOUNTER — Encounter (HOSPITAL_COMMUNITY)
Admission: EM | Disposition: A | Discharge status: Rehabilitation Facility | Payer: Self-pay | Source: Home / Self Care | Attending: Internal Medicine

## 2016-07-07 LAB — BASIC METABOLIC PANEL
Anion gap: 6 (ref 5–15)
BUN: 13 mg/dL (ref 6–20)
CHLORIDE: 104 mmol/L (ref 101–111)
CO2: 28 mmol/L (ref 22–32)
CREATININE: 0.77 mg/dL (ref 0.61–1.24)
Calcium: 9.3 mg/dL (ref 8.9–10.3)
GFR calc Af Amer: >60 mL/min (ref >60)
GFR calc non Af Amer: >60 mL/min (ref >60)
GLUCOSE: 85 mg/dL (ref 65–99)
POTASSIUM: 3.5 mmol/L (ref 3.5–5.1)
SODIUM: 138 mmol/L (ref 135–145)

## 2016-07-07 SURGERY — ECHOCARDIOGRAM, TRANSESOPHAGEAL
Anesthesia: Moderate Sedation

## 2016-07-07 NOTE — Progress Notes (Signed)
Patient Name: Jack [name] Date of Encounter: 07/07/2016  Primary Cardiologist: Dr. Anthoney Harada Problem List     Principal Problem:   Right sided weakness Active Problems:   History of atrial fibrillation   Hx of mitral valve repair   History of stroke   Essential hypertension   TIA (transient ischemic attack)   Embolic stroke involving left middle cerebral artery (HCC)   Acute CVA (cerebrovascular accident) ([place])   PFO (patent foramen ovale)   History of CVA with residual deficit   Benign essential HTN   Non compliance with medical treatment   Hypokalemia   Acute blood loss anemia     Subjective   Feels ok, he is concerned about esophageal tear and therefore refused TEE.   Inpatient Medications    Scheduled Meds: . amLODipine  10 mg Oral Daily  . aspirin EC  325 mg Oral Once  . aspirin EC  325 mg Oral Daily  . enoxaparin (LOVENOX) injection  40 mg Subcutaneous Daily  . pravastatin  40 mg Oral q1800   Continuous Infusions: . sodium chloride 20 mL/hr at 07/07/16 0017   PRN Meds: acetaminophen **OR** acetaminophen (TYLENOL) oral liquid 160 mg/5 mL **OR** acetaminophen, diphenhydrAMINE, senna-docusate   Vital Signs    Vitals:   07/06/16 2208 07/07/16 0000 07/07/16 0136 07/07/16 0556  BP: 122/69  (!) 156/82 (!) 142/74  Pulse: 78  73 84  Resp: 20  20 20   Temp: 98 F (36.7 C)  98.2 F (36.8 C) 98.4 F (36.9 C)  TempSrc: Oral  Oral Oral  SpO2: 99%  97% 98%  Weight:  147 lb 7.8 oz (66.9 kg)    Height:  5\' 4"  (1.626 m)      Intake/Output Summary (Last 24 hours) at 07/07/16 [id number] Last data filed at 07/07/16 0800  Gross per 24 hour  Intake           514.33 ml  Output              240 ml  Net           274.33 ml   Filed Weights   07/03/16 1212 07/07/16 0000  Weight: 147 lb 8 oz (66.9 kg) 147 lb 7.8 oz (66.9 kg)    Physical Exam    GEN: Well nourished, well developed, in no acute distress.  HEENT: Grossly normal.  Neck: Supple, no JVD, carotid  bruits, or masses. Cardiac: RRR, no murmurs, rubs, or gallops. No clubbing, cyanosis, edema.  Radials/DP/PT 2+ and equal bilaterally.  Respiratory:  Respirations regular and unlabored, clear to auscultation bilaterally. GI: Soft, nontender, nondistended, BS + x 4. MS: no deformity or atrophy. Skin: warm and dry, no rash. Neuro:  Strength and sensation are intact. Psych: AAOx3.  Normal affect.  Labs    Basic Metabolic Panel  Recent Labs  07/06/16 0525 07/07/16 0519  NA 137 138  K 3.4* 3.5  CL 103 104  CO2 26 28  GLUCOSE 97 85  BUN 11 13  CREATININE 0.86 0.77  CALCIUM 9.2 9.3    Telemetry    NSR - Personally Reviewed  ECG    NSR, PAC's - Personally Reviewed  Radiology    No results found.  Cardiac Studies   Transthoracic Echocardiography Study Conclusions  - Left ventricle: The cavity size was normal. Wall thickness was   increased in a pattern of mild LVH. Systolic function was normal.   The estimated ejection fraction was in the range of  60% to 65%.   Wall motion was normal; there were no regional wall motion   abnormalities. The study is not technically sufficient to allow   evaluation of LV diastolic function. - Mitral valve: Annuloplasty ring in place. Mildly thickened   leaflets . No significant stenosis. There was trace to mild   regurgitation. - Left atrium: Severely dilated. - Right ventricle: The cavity size was mildly dilated. Systolic   function was low normal. - Right atrium: The atrium was at the upper limits of normal in   size. - Tricuspid valve: There was moderate eccentric regurgitation. - Pulmonic valve: There was mild regurgitation. - Pulmonary arteries: Dilated. PA peak pressure: 111 mm Hg (S). - Inferior vena cava: The vessel was dilated. The respirophasic   diameter changes were blunted (< 50%), consistent with elevated   central venous pressure.  Impressions:  - LVEF 60-65%, mild LVH, normal wall motion, mitral annuloplasty    ring in place without significant stenosis and trace to mild MR,   severe LAE, mildly dilated RV with low normal systolic function,   upper normal RA size, moderate eccentric TR with a calcified   annulus, severe pulmonary hypertension - RVSP 111 mmHG, dilated   IVC, mild PI. Patient Profile     Jack [name] is a 82 y.o. 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 He was admitted on 07/03/16 with CVA, cardiology was consulted for consideration of LAA occlusive device.  Assessment & Plan    1. Stroke, afib, valvular heart disease Per Dr. Jackalyn Lombard note yesterday:  The patient presents with a very challenging problem.  He has had afib previously and presents with stroke.  He is a poor candidate for long term anticoagulation due to prior ICH.  BP continues to run very high and suggests increased risks for ICH related to BP.  He has previously felt to not be a coumadin candidate by Dr Wynonia Lawman.   He has had his LAA oversewn surgically and thus is also not a candidate for Watchman LAAO device.  As we know that he has afib, an implantable loop recorder would not likely be beneficial. At this point, I would favor TEE to better evaluate his LAA (make sure there is no flow within the appendage and look for other causes of stroke).  If uneventful, then we will have to decide if he is treated with coumadin, eliquis, or avoidance of anticoagulation long term.  His chads2vasc score is 5.  Today, he refused TEE due to the risk of esophageal tear. Dr. Debara Pickett spoke to him at length about his decision. MD to advise.    Signed, Arbutus Leas, NP  07/07/2016, 9:27 AM   Agree with note by Jettie Booze NP  Pt refuses TEE. Not an anticoag candidate secondary to prior ICH and HTN. PAF. H/O LAA oversewing at the time of Surgical MAZE. Nothing further to add. Plan DC to IP Rehab the F/U as OP with Dr Wynonia Lawman. Will see again as  needed.   Lorretta Harp, M.D., [place], [facility], [place] [address] [place],   [id number]  [phone] 07/07/2016 11:29 AM

## 2016-07-07 NOTE — Clinical Social Work Placement (Signed)
   CLINICAL SOCIAL WORK PLACEMENT  NOTE  Date:  07/07/2016  Patient Details  Name: Jack [name] MRN: [id number] Date of Birth: [birth year]  Clinical Social Work is seeking post-discharge placement for this patient at the [place] level of care (*CSW will initial, date and re-position this form in  chart as items are completed):  Yes   Patient/family provided with [place] Work Department's list of facilities offering this level of care within the geographic area requested by the patient (or if unable, by the patient's family).  Yes   Patient/family informed of their freedom to choose among providers that offer the needed level of care, that participate in Medicare, Medicaid or managed care program needed by the patient, have an available bed and are willing to accept the patient.  Yes   Patient/family informed of [place]'s ownership interest in [facility] and [facility], as well as of the fact that they are under no obligation to receive care at these facilities.  PASRR submitted to EDS on 07/07/16     PASRR number received on 07/07/16     Existing PASRR number confirmed on       FL2 transmitted to all facilities in geographic area requested by pt/family on 07/07/16     FL2 transmitted to all facilities within larger geographic area on       Patient informed that his/her managed care company has contracts with or will negotiate with certain facilities, including the following:            Patient/family informed of bed offers received.  Patient chooses bed at       Physician recommends and patient chooses bed at      Patient to be transferred to   on  .  Patient to be transferred to facility by       Patient family notified on   of transfer.  Name of family member notified:        PHYSICIAN       Additional Comment:    _______________________________________________ Darden Dates, LCSW 07/07/2016, 5:21 PM

## 2016-07-07 NOTE — Progress Notes (Signed)
Personally discussed the TEE procedure in detail with the patient and his wife, including the indication for the procedure being that it is possible (and I have previously seen) over sewn atrial appendages that open - exposing the patient for later risk of thromboembolism. He is concerned about the risk of esophageal injury, which is exceedingly low (<1:5000). At this point he is refusing the procedure. He understands that he may be inadequately anticoagulated in the future to protect against cardiac thrombus if it were present which could lead to future and potentially more morbid or fatal strokes.  Pixie Casino, MD, [facility] Attending Cardiologist [place]

## 2016-07-07 NOTE — Progress Notes (Signed)
Occupational Therapy Treatment Patient Details Name: Jack [name] MRN: [id number] DOB: [birth year] Today's Date: 07/07/2016    History of present illness 82 y.o. 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  MRI revealed small acute cortical/subcortical infarcts in the high left frontoparietal region and punctate acute right occipital lobe cortical infarcts.    OT comments  Pt reports deciding not to proceed with TEE stating he is too old. Pt eager to eat after being NPO. OOB to sink to wash hands and comb hair and then seated in chair with assist to set up tray. Pt continues to be highly motivated to get stronger and return home with his wife.  Follow Up Recommendations  CIR;Supervision/Assistance - 24 hour    Equipment Recommendations  3 in 1 bedside commode    Recommendations for Other Services      Precautions / Restrictions Precautions Precautions: Fall Restrictions Weight Bearing Restrictions: No       Mobility Bed Mobility Overal bed mobility: Needs Assistance Bed Mobility: Supine to Sit     Supine to sit: Supervision;HOB elevated     General bed mobility comments: increased time, no physical assist  Transfers Overall transfer level: Needs assistance Equipment used: Rolling walker (2 wheeled) Transfers: Sit to/from Stand Sit to Stand: Min assist Stand pivot transfers: Min assist       General transfer comment: verbal cues for hand placement, steadying assist    Balance Overall balance assessment: Needs assistance Sitting-balance support: Feet supported Sitting balance-Leahy Scale: Poor Sitting balance - Comments: Leans L with donning socks     Standing balance-Leahy Scale: Poor                     ADL Overall ADL's : Needs assistance/impaired Eating/Feeding: Minimal  assistance;Sitting Eating/Feeding Details (indicate cue type and reason): assist to open containers, pt placed foam build up on fork independently Grooming: Wash/dry hands;Standing;Min guard               Lower Body Dressing: Min guard;Sitting/lateral leans Lower Body Dressing Details (indicate cue type and reason): donned socks with assist to balance at EOB, tends to lean L             Functional mobility during ADLs: Minimal assistance;Rolling walker;Cueing for safety;Cueing for sequencing        Vision                 Additional Comments: pt with high prescription of lenses   Perception     Praxis      Cognition   Behavior During Therapy: [facility] for tasks assessed/performed Overall Cognitive Status: History of cognitive impairments - at baseline                  General Comments: Pt recalling events of last OT session yesterday.    Extremity/Trunk Assessment               Exercises     Shoulder Instructions       General Comments      Pertinent Vitals/ Pain       Pain Assessment: No/denies pain  Home Living  Prior Functioning/Environment              Frequency  Min 2X/week        Progress Toward Goals  OT Goals(current goals can now be found in the care plan section)  Progress towards OT goals: Progressing toward goals  Acute Rehab OT Goals Patient Stated Goal: independence Time For Goal Achievement: 07/21/15 Potential to Achieve Goals: Good  Plan Discharge plan remains appropriate    Co-evaluation                 End of Session Equipment Utilized During Treatment: Gait belt;Rolling walker;Other (comment) (foam build up)   Activity Tolerance Patient tolerated treatment well   Patient Left in chair;with call bell/phone within reach;with chair alarm set   Nurse Communication          Time: [id number] OT Time Calculation (min): 26  min  Charges: OT General Charges $OT Visit: 1 Procedure OT Treatments $Self Care/Home Management : 23-37 mins  Malka So 07/07/2016, 10:54 AM  [phone]

## 2016-07-07 NOTE — Progress Notes (Signed)
Physical Therapy Treatment Patient Details Name: Jack [name] MRN: [id number] DOB: 1934/04/13 Today's Date: 07/07/2016    History of Present Illness 82 y.o. male with a history of stroke 2 years ago with residual left hemiparesis, hypertension, hyperlipidemia and atrial fibrillation not on anticoagulation, seen in the ED at [facility] with a complaint of new onset right-sided weakness.  MRI revealed small acute cortical/subcortical infarcts in the high left frontoparietal region and punctate acute right occipital lobe cortical infarcts.     PT Comments    Continuing work on gait, balance, activity tolerance, and functional mobility; Noting a slightly more persistent posterior lean today in static standing as well as more upper trunk flexion while walking;   Continue to recommend comprehensive inpatient rehab (CIR) for post-acute therapy needs.   Follow Up Recommendations  CIR;Supervision/Assistance - 24 hour     Equipment Recommendations  None recommended by PT    Recommendations for Other Services Rehab consult     Precautions / Restrictions Precautions Precautions: Fall    Mobility  Bed Mobility Overal bed mobility: Needs Assistance Bed Mobility: Supine to Sit     Supine to sit: Supervision;HOB elevated     General bed mobility comments: increased time, no physical assist  Transfers Overall transfer level: Needs assistance Equipment used: Rolling walker (2 wheeled) Transfers: Sit to/from Stand Sit to Stand: Mod assist Stand pivot transfers: Min assist       General transfer comment: verbal cues for hand placement, steadying assist; small, but persistent posterior lean, requiring light mod assist to stay on his feet  Ambulation/Gait Ambulation/Gait assistance: Min assist Ambulation Distance (Feet): 100 Feet Assistive device: Rolling walker (2 wheeled) Gait Pattern/deviations: Decreased stride length;Trunk flexed;Narrow base of support Gait velocity: decreased    General Gait Details: verbal and tactile cues for posture, left lateral lean; focused on incr L stance stability with cues and tactile facilitation to activate L knee and hip extensors, and extend hip and knee in L stance; better R step length with facilitation as well; cue sfor more upright posture   Stairs            Wheelchair Mobility    Modified Rankin (Stroke Patients Only)       Balance Overall balance assessment: Needs assistance Sitting-balance support: Feet supported Sitting balance-Leahy Scale: Poor Sitting balance - Comments: Leans L with donning socks     Standing balance-Leahy Scale: Poor                      Cognition Arousal/Alertness: Awake/alert Behavior During Therapy: WFL for tasks assessed/performed Overall Cognitive Status: History of cognitive impairments - at baseline                 General Comments: Pt recalling events of last OT session yesterday.    Exercises      General Comments        Pertinent Vitals/Pain Pain Assessment: No/denies pain Pain Score: 0-No pain    Home Living                      Prior Function            PT Goals (current goals can now be found in the care plan section) Acute Rehab PT Goals Patient Stated Goal: independence PT Goal Formulation: With patient/family Time For Goal Achievement: 07/18/16 Potential to Achieve Goals: Good Progress towards PT goals: Progressing toward goals    Frequency    Min 4X/week  PT Plan Current plan remains appropriate    Co-evaluation             End of Session Equipment Utilized During Treatment: Gait belt Activity Tolerance: Patient tolerated treatment well Patient left: in chair;with call bell/phone within reach;with chair alarm set;with family/visitor present     Time: [id number] PT Time Calculation (min) (ACUTE ONLY): 22 min  Charges:  $Gait Training: 23-37 mins                    G Codes:      Colletta [place] Jul 23, 2016, 1:45 PM  Roney Marion, [place] Pager [phone] Office [phone]

## 2016-07-07 NOTE — Progress Notes (Signed)
Inpatient Rehabilitation  Received a denial from [facility] [employer] for [place] authorization.  I have spoken with patient, wife, and son and will proceed with an expedited appeal.  Will update team as I know.  Please call with questions.  Carmelia Roller., CCC/SLP Admission Coordinator  [place]  Cell [phone]

## 2016-07-07 NOTE — NC FL2 (Signed)
[place] LEVEL OF CARE SCREENING TOOL     IDENTIFICATION  Patient Name: Jack [name] Birthdate: 09-06-1933 Sex: male Admission Date (Current Location): 07/03/2016  [facility] and [place] Number:  Herbalist and Address:  The . [facility], [place] [address], [place], [place] [address]      Provider Number: [id number]  Attending Physician Name and Address:  Caren Griffins, MD  Relative Name and Phone Number:       Current Level of Care: Hospital Recommended Level of Care: [place] Prior Approval Number:    Date Approved/Denied:   PASRR Number: [id number]  Discharge Plan: SNF    Current Diagnoses: Patient Active Problem List   Diagnosis Date Noted  . PFO (patent foramen ovale)   . History of CVA with residual deficit   . Benign essential HTN   . Non compliance with medical treatment   . Hypokalemia   . Acute blood loss anemia   . Acute CVA (cerebrovascular accident) ([place]) 07/05/2016  . 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 TIA (transient ischemic attack) 07/03/2016  . Right sided weakness 07/03/2016  . Microscopic hematuria 04/03/2015  . Hyperlipidemia 04/03/2015  . Wears hearing aid 04/03/2015  . Changing skin lesion 04/03/2015  . Paresthesia and pain of left extremity 04/02/2015  . Encounter for health maintenance examination in adult 04/02/2015  . Vaccine counseling 04/02/2015  . Hernia of abdominal cavity 04/02/2015  . Impaired gait and mobility 04/02/2015  . Leg swelling 03/03/2015  . Essential hypertension 03/03/2015  . Spastic hemiplegia affecting nondominant side ([place]) 12/14/2013  . ICH (intracerebral hemorrhage) ([place]) 08/02/2013  . Hx of mitral valve repair   . History of stroke   . History of atrial fibrillation   . Cyst and pseudocyst of pancreas 09/13/2007  . PVC (premature ventricular contraction)   . History of renal cyst   . BPH (benign prostatic hypertrophy)      Orientation RESPIRATION BLADDER Height & Weight     Self, Time, Situation, Place  Normal Continent Weight: 147 lb 7.8 oz (66.9 kg) Height:  5\' 4"  (162.6 cm)  BEHAVIORAL SYMPTOMS/MOOD NEUROLOGICAL BOWEL NUTRITION STATUS      Continent (S) Diet (Heart Healthy, Thin Liquids)  AMBULATORY STATUS COMMUNICATION OF NEEDS Skin   Limited Assist Verbally Normal                       Personal Care Assistance Level of Assistance  Bathing, Feeding, Dressing Bathing Assistance: Limited assistance Feeding assistance: Independent Dressing Assistance: Limited assistance     Functional Limitations Info  Sight, Speech, Hearing Sight Info: Adequate Hearing Info: Adequate Speech Info: Adequate    SPECIAL CARE FACTORS FREQUENCY  PT (By licensed PT), OT (By licensed OT)     PT Frequency: 5 OT Frequency: 5            Contractures Contractures Info: Not present    Additional Factors Info  Code Status, Allergies Code Status Info: Full Code Allergies Info: Valsartan           Current Medications (07/07/2016):  This is the current hospital active medication list Current Facility-Administered Medications  Medication Dose Route Frequency Provider Last Rate Last Dose  . 0.9 %  sodium chloride infusion   Intravenous Continuous Baldwin Jamaica, PA-C   Stopped at 07/07/16 1607  . acetaminophen (TYLENOL) tablet 650 mg  650 mg Oral Q4H PRN Norval Morton, MD  Or  . acetaminophen (TYLENOL) solution 650 mg  650 mg Per Tube Q4H PRN Norval Morton, MD       Or  . acetaminophen (TYLENOL) suppository 650 mg  650 mg Rectal Q4H PRN Norval Morton, MD      . amLODipine (NORVASC) tablet 10 mg  10 mg Oral Daily Norval Morton, MD   10 mg at 07/07/16 1057  . aspirin EC tablet 325 mg  325 mg Oral Once Alexandra M Law, PA-C      . aspirin EC tablet 325 mg  325 mg Oral Daily Norval Morton, MD   325 mg at 07/07/16 1058  . diphenhydrAMINE (BENADRYL) capsule 25 mg  25 mg Oral Once PRN  Wallie Char      . enoxaparin (LOVENOX) injection 40 mg  40 mg Subcutaneous Daily Norval Morton, MD   40 mg at 07/07/16 1058  . pravastatin (PRAVACHOL) tablet 40 mg  40 mg Oral q1800 David L Rinehuls, PA-C   40 mg at 07/06/16 1744  . senna-docusate (Senokot-S) tablet 1 tablet  1 tablet Oral QHS PRN Norval Morton, MD         Discharge Medications: Please see discharge summary for a list of discharge medications.  Relevant Imaging Results:  Relevant Lab Results:   Additional Information SSN:  [id number]  Darden Dates, LCSW

## 2016-07-07 NOTE — Progress Notes (Signed)
PROGRESS NOTE  TILLMAN INSERRA [id number] DOB: 31-Jul-1933 DOA: 07/03/2016 PCP: Crisoforo Oxford, PA-C   LOS: 2 days   Brief Narrative: Jack [name] a 82 y.o.malewith medical history significant ofHTN, HLD,CVA with residual left-sided weakness, atrial fibrillation, s/p MVR;presents with complaints of right-sided weakness. Patient notes that he got up this morning around 8 AM and was able to walk with the assistance of a cane to eat his breakfast. Following breakfast he noted that he had difficulty utilizing his right arm that he uses to hold his cane. He was also noted to have weakness in right lower extremity as well. Patient has had irregular heart beat for a few years now, but has never been on anticoagulation. Takes baby aspirin. Followed by Dr. Wynonia Lawman of cardiology. In reviewing cardiology records from Dr. Wynonia Lawman from 2015 it appears that patient was thought not to have been in A. fib, question holtermonitor, and therefore was deemed not to be a candidate for anticoagulation at that time. It appears patient also was noted to have history of intercranial hemorrhage .  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 Vertebral artery flow is antegrade.  - Stroke team, PT OT SLP  - Ha1c 5.5 - Echo with EF 60-65%, LAE, dilated Rv, severe pulmonary HTN RVSP 156mmHG    - Continue aspirin for now, Dr. Maylene Roes discussed with patient, does not wish anticoagulation   Paroxysmal A Fib, currently in normal sinus - Chadsvascscore= 5. S/p Maze procedure as well as amputation of atrial appendage in the past. Had ICH and was deemed not a candidate for anticoagulation  - Continue aspirin for now. Recommend anticoagulation with eliquis to prevent stroke, but does have hx of ICH in 2015. Dr. Maylene Roes discussed the risks and benefits of eliquis vs aspirin alone. Currently, he is refusing anticoagulation even if it means he could have another stroke.  - EP Dr. Rayann Heman consulted for consideration for left atrial appendage occlusion using watchman device, but given that he had LAA oversewn surgically, he is not a candidate for Watchman LAAO device - recommending TEE however patient is refusing. D/w Dr. Gwenlyn Found today   Essential hypertension - Amlodipine  HLD - continue statin   DVT prophylaxis: Lovenox Code Status: Full Family Communication: family bedside Disposition Plan: CIR pending approval. D/w admission coordinator  Consultants:   Cardiology   EP  Neurology   Procedures:   2D echo: EF  60-65%, severe pulmonary HTN  Antimicrobials:  None   Subjective: - no chest pain, shortness of breath, no abdominal pain, nausea or vomiting. Feeling well.  Objective: Vitals:   07/07/16 0000 07/07/16 0136 07/07/16 0556 07/07/16 0952  BP:  (!) 156/82 (!) 142/74 (!) 151/75  Pulse:  73 84 83  Resp:  20 20 17   Temp:  98.2 F (36.8 C) 98.4 F (36.9 C) 98.2 F (36.8  C)  TempSrc:  Oral Oral Oral  SpO2:  97% 98% 95%  Weight: 66.9 kg (147 lb 7.8 oz)     Height: 5\' 4"  (1.626 m)       Intake/Output Summary (Last 24 hours) at 07/07/16 1334 Last data filed at 07/07/16 0800  Gross per 24 hour  Intake           514.33 ml  Output              240 ml  Net           274.33 ml   Filed Weights   07/03/16 1212 07/07/16 0000  Weight: 66.9 kg (147 lb 8 oz) 66.9 kg (147 lb  7.8 oz)    Examination: Constitutional: NAD Vitals:   07/07/16 0000 07/07/16 0136 07/07/16 0556 07/07/16 0952  BP:  (!) 156/82 (!) 142/74 (!) 151/75  Pulse:  73 84 83  Resp:  20 20 17   Temp:  98.2 F (36.8 C) 98.4 F (36.9 C) 98.2 F (36.8 C)  TempSrc:  Oral Oral Oral  SpO2:  97% 98% 95%  Weight: 66.9 kg (147 lb 7.8 oz)     Height: 5\' 4"  (1.626 m)      Eyes: PERRL, lids and conjunctivae normal Respiratory: clear to auscultation bilaterally, no wheezing, no crackles.  Cardiovascular: Regular rate and rhythm, no murmurs / rubs / gallops. No LE edema. Abdomen: no tenderness. Bowel sounds positive.  Musculoskeletal: no clubbing / cyanosis.  Neurologic: non focal    Data Reviewed: I have personally reviewed following labs and imaging studies  CBC:  Recent Labs Lab 07/03/16 1400 07/04/16 0220  WBC 8.0 9.0  NEUTROABS 5.8  --   HGB 12.9* 12.1*  HCT 38.9* 37.0*  MCV 89.2 88.7  PLT 295 0000000   Basic Metabolic Panel:  Recent Labs Lab 07/03/16 1400 07/04/16 0220 07/05/16 1141 07/06/16 0525 07/07/16 0519  NA 138 139 135 137 138  K 3.7 3.4* 3.5 3.4* 3.5  CL 102 106 102 103 104  CO2 28 26 27 26 28   GLUCOSE 100* 83 115* 97 85  BUN 14 10 10 11 13   CREATININE 0.82 0.78 0.82 0.86 0.77  CALCIUM 9.1 8.7* 8.8* 9.2 9.3  MG  --  1.9  --   --   --    GFR: Estimated Creatinine Clearance: 61.7 mL/min (by C-G formula based on SCr of 0.77 mg/dL). Liver Function Tests: No results for input(s): AST, ALT, ALKPHOS, BILITOT, PROT, ALBUMIN in the last 168 hours. No results for input(s): LIPASE, AMYLASE in the last 168 hours. No results for input(s): AMMONIA in the last 168 hours. Coagulation Profile: No results for input(s): INR, PROTIME in the last 168 hours. Cardiac Enzymes: No results for input(s): CKTOTAL, CKMB, CKMBINDEX, TROPONINI in the last 168 hours. BNP (last 3 results) No results for input(s): PROBNP in the last 8760 hours. HbA1C: No results for input(s): HGBA1C in the  last 72 hours. CBG: No results for input(s): GLUCAP in the last 168 hours. Lipid Profile: No results for input(s): CHOL, HDL, LDLCALC, TRIG, CHOLHDL, LDLDIRECT in the last 72 hours. Thyroid Function Tests: No results for input(s): TSH, T4TOTAL, FREET4, T3FREE, THYROIDAB in the last 72 hours. Anemia Panel: No results for input(s): VITAMINB12, FOLATE, FERRITIN, TIBC, IRON, RETICCTPCT in the last 72 hours. Urine analysis:    Component Value Date/Time   COLORURINE YELLOW 07/03/2016 1400   APPEARANCEUR CLEAR 07/03/2016 1400   LABSPEC 1.006 07/03/2016 1400   PHURINE 8.0  07/03/2016 [employer] 07/03/2016 1400   HGBUR NEGATIVE 07/03/2016 1400   BILIRUBINUR NEGATIVE 07/03/2016 1400   BILIRUBINUR n 04/02/2015 1500   KETONESUR NEGATIVE 07/03/2016 1400   PROTEINUR NEGATIVE 07/03/2016 1400   UROBILINOGEN negative 04/02/2015 1500   UROBILINOGEN 0.2 08/02/2013 1206   NITRITE NEGATIVE 07/03/2016 1400   LEUKOCYTESUR NEGATIVE 07/03/2016 1400   Sepsis Labs: Invalid input(s): PROCALCITONIN, LACTICIDVEN  No results found for this or any previous visit (from the past 240 hour(s)).    Radiology Studies: No results found.   Scheduled Meds: . amLODipine  10 mg Oral Daily  . aspirin EC  325 mg Oral Once  . aspirin EC  325 mg Oral Daily  . enoxaparin (LOVENOX) injection  40 mg Subcutaneous Daily  . pravastatin  40 mg Oral q1800   Continuous Infusions: . sodium chloride 20 mL/hr at 07/07/16 0800    Marzetta Board, MD, PhD [facility] Pager [phone]  If 7PM-7AM, please contact night-coverage [email or web address] Password [email or web address] 07/07/2016, 1:34 PM

## 2016-07-07 NOTE — Progress Notes (Signed)
STROKE TEAM PROGRESS NOTE   HISTORY OF PRESENT ILLNESS (per record) Jack [name] is an 82 y.o. 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 Deficit was present when he woke up this morning. He was last known well at 10:00 last night. His wife noted difficulty with ambulation with coordination of his right lower extremity as well as coordination of his right upper extremity using his cane. CT scan of his head showed no acute changes. MRI is pending. NIH stroke score at the time of this evaluation was 2. Patient was LKW 10:00 PM on 05/02/2016. tPA was not given as beyond time under for treatment consideration when he arrived in the ED.    SUBJECTIVE (INTERVAL HISTORY)  Patient is setting up in bedside chair.  He has had multiple strokes and had a hemorrhagic stroke in 2015 and has been reluctant to consider anticoagulation despite coming back with recurrent strokes over the years.Discussed with Dr. Rayann Heman. Patient had mitral valvuloplasty in 2007 and a maze procedure as well. He may not be a candidate for left atrial appendage occlusion But TEE may help to see if he has any regrowth of his left atrial appendage despite maze procedure. Patient however appeared reluctant to even get a TEE done    OBJECTIVE Temp:  [97.9 F (36.6 C)-98.6 F (37 C)] 98.6 F (37 C) (12/27 1450) Pulse Rate:  [73-84] 79 (12/27 1450) Cardiac Rhythm: Normal sinus rhythm;Heart block (12/27 0759) Resp:  [16-20] 16 (12/27 1450) BP: (120-156)/(67-82) 148/79 (12/27 1450) SpO2:  [95 %-99 %] 96 % (12/27 1450) Weight:  [147 lb 7.8 oz (66.9 kg)] 147 lb 7.8 oz (66.9 kg) (12/27 0000)  CBC:   Recent Labs Lab 07/03/16 1400 07/04/16 0220  WBC 8.0 9.0  NEUTROABS 5.8  --   HGB 12.9* 12.1*  HCT 38.9* 37.0*  MCV 89.2 88.7  PLT 295 278    Basic  Metabolic Panel:   Recent Labs Lab 07/04/16 0220  07/06/16 0525 07/07/16 0519  NA 139  < > 137 138  K 3.4*  < > 3.4* 3.5  CL 106  < > 103 104  CO2 26  < > 26 28  GLUCOSE 83  < > 97 85  BUN 10  < > 11 13  CREATININE 0.78  < > 0.86 0.77  CALCIUM 8.7*  < > 9.2 9.3  MG 1.9  --   --   --   < > = values in this interval not displayed.  Lipid Panel:     Component Value Date/Time   CHOL 128 07/04/2016 0220   TRIG 74 07/04/2016 0220   HDL 29 (L) 07/04/2016 0220   CHOLHDL 4.4 07/04/2016 0220   VLDL 15 07/04/2016 0220   LDLCALC 84 07/04/2016 0220   HgbA1c:  Lab Results  Component Value Date   HGBA1C 5.5 07/04/2016   Urine Drug Screen:     Component Value Date/Time   LABOPIA NONE DETECTED 08/02/2013 1206   COCAINSCRNUR NONE DETECTED 08/02/2013 1206   LABBENZ NONE DETECTED 08/02/2013 1206   AMPHETMU NONE DETECTED 08/02/2013 1206   THCU NONE DETECTED 08/02/2013 1206   LABBARB NONE DETECTED 08/02/2013 1206      IMAGING  Ct Head Wo Contrast 07/03/2016 Atrophy with small vessel chronic ischemic changes of deep cerebral white matter. Multiple old infarcts as above. No  acute intracranial abnormalities.   Mri / Mra Head/brain Wo Cm 07/04/2016 1. Incomplete, motion degraded examination as above.  2. Small acute cortical/subcortical infarcts in the high left frontoparietal region.  3. Punctate acute right occipital lobe cortical infarcts.  4. Chronic cerebral and cerebellar infarcts as above.  5. No evidence of major intracranial arterial occlusion or significant proximal stenosis.   Lower extremity venous duplex  No evidence of DVT or baker's cyst.   PHYSICAL EXAM HEENT-  Normocephalic, no lesions, without obvious abnormality.  Normal external eye and conjunctiva.  Normal TM's bilaterally.  Normal auditory canals and external ears. Normal external nose, mucus membranes and septum.  Normal pharynx. Neck supple with no masses, nodes, nodules or enlargement. Cardiovascular -  regularly irregular rhythm, S1, S2 normal and no S3 or S4 Lungs - chest clear, no wheezing, rales, normal symmetric air entry Abdomen - soft, non-tender; bowel sounds normal; no masses,  no organomegaly Extremities - no joint deformities, effusion, or inflammation  Neurologic Examination: Mental Status: Alert, oriented to name, date, location, situation, no acute distress.  Speech fluent without evidence of aphasia. Able to follow commands without difficulty. Cranial Nerves: II-Visual fields were normal. III/IV/VI-Pupils were equal and reacted normally to light. Extraocular movements were full and conjugate. V/VII-no facial numbness and no facial weakness. VIII: Significantly impaired hearing X-normal speech. XI: trapezius strength/neck flexion strength normal bilaterally XII-midline tongue extension with normal strength. Motor: Significant weakness of right grip and intrinsic hand muscles. Fine finger movements are diminished on the right compared to the left. Orbits left over right upper extremity. mild left hemiparesis from previous stroke. No Drift of the right arm or right leg.  Sensory: Patient endorses symmetric sensation to light touch Deep Tendon Reflexes:  symmetric. Plantars: Flexor bilaterally Cerebellar: mild dysmetria noted FTN on the left UE but intact in the other limbs Gait: Deferred    ASSESSMENT/PLAN [name] is a 82 y.o. male with history of a stroke 2 years ago with residual left hemiparesis, PFO, hypertension, hyperlipidemia and atrial fibrillation not on anticoagulation  presenting with new onset right-sided weakness. He did not receive IV t-PA due to late presentation.  Multiple Strokes:  Dominant infarct felt to be embolic secondary to atrial fibrillation without anticoagulation. 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 Chronic cerebral and cerebellar infarcts.  MRA - No evidence of major intracranial arterial occlusion or significant proximal stenosis.   Bilateral L E venous duplex - negative for DVT  Carotid Doppler - 1-39% ICA plaquing. Vertebral artery flow is antegrade.   2D Echo - pending  LDL - 84  HgbA1c pending  VTE prophylaxis - Lovenox Diet Heart Room service appropriate? Yes; Fluid consistency: Thin  aspirin 81 mg daily prior to admission, now on aspirin 325 mg daily  Patient counseled to be compliant with his antithrombotic medications  Ongoing aggressive stroke risk factor management  Atrial fibrillation - recommend anticoagulation (cardiologist - Dr Wynonia Lawman) [place]. Discussed with his attending Dr. Maylene Roes, she will call his cardiologist and discuss with patient and family.  Therapy recommendations:  pending  Disposition: Pending  Atrial Fibrillation  Home anticoagulation:  aspirin 81 mg daily continued in the hospital  CHA2DS2-VASc Score = 5, ?2 oral anticoagulation recommended    Continue aspirin at discharge a snot a good antiocoagulation candidate long term due to h/o ICH.  Consider left atrial occlusion with watchman device   Hypertension  Stable  Permissive hypertension (OK if < 220/120) but gradually normalize in 5-7 days  Long-term BP goal normotensive  Hyperlipidemia  Home meds:  Pravachol 20 mg daily not resumed in hospital  LDL 84, goal < 70  Increase Pravachol to 40 mg daily  Continue statin at discharge  Other Stroke Risk Factors  Advanced age  Overweight, Body mass index is 25.32 kg/m., recommend weight loss, diet and exercise as appropriate   Hx stroke/TIA  Atrial Fibrillation without anticoagulation.  Other Active Problems  PFO hx ->   lower extremity venous Dopplers negative  Atrial fibrillation - recommend  Watchman device (cardiologist - Dr Wynonia Lawman) [place]. Discussed  with his attending Dr. Maylene Roes, she will call his cardiologist and discuss with patient and family.    previous history of [place] I jan 2015 participated in [facility] 2 study  Mild hypokalemia - 3.4 (supplemented)  Hospital day # 2  I have personally examined this patient, reviewed notes, independently viewed imaging studies, participated in medical decision making and plan of care.ROS completed by me personally and pertinent positives fully documented  I have made any additions or clarifications directly to the above note.  Patient has presented with right hand weakness due to embolic left frontal MCA branch infarcts from atrial fibrillation. He however is not a good long-term anticoagulation candidate given history of thalamic hemorrhage in January 2015. I agree with continuing aspirin but   I have d/w Dr. Rayann Heman who feels patient is not a candidate for left atrial appendage occlusion. Recommend checking a TEE to look for other causes of cardiac source of embolism. But patient is refusing that as well.. Continue aspirin for stroke prevention. Stroke team will sign off at the present time. Discussed with Dr. Rayann Heman and Cruzita Lederer. Greater than 50% time during the study 25 minute visit was spent on counseling and coordination of care about his atrial fibrillation, stroke and intracerebral hemorrhage and risk, anticoagulation and answered questions discuss with Dr. Rayann Heman and Melvenia Needles, MD Medical Director [place] Pager: [phone] 07/07/2016 4:12 PM   To contact Stroke Continuity provider, please refer to [email or web address] After hours, contact General Neurology

## 2016-07-07 NOTE — Progress Notes (Signed)
Inpatient Rehabilitation  Met with patient to discuss team's recommendation for IP Rehab.  Shared booklets and answered questions.  Plan to initiate insurance authorization.  Will follow for timing of medical readiness, insurance approval, and bed availability.  Please call with questions.   Carmelia Roller., CCC/SLP Admission Coordinator  [place]  Cell [phone]

## 2016-07-07 NOTE — Clinical Social Work Note (Signed)
Clinical Social Work Assessment  Patient Details  Name: Jack [name] MRN: [id number] Date of Birth: 08/28/1933  Date of referral:  07/07/16               Reason for consult:  Facility Placement, Discharge Planning                Permission sought to share information with:  Family Supports Permission granted to share information::  Yes, Verbal Permission Granted  Name::     Betty Siciliano  Contact Information:  [phone]  Housing/Transportation Living arrangements for the past 2 months:  Single Family Home Source of Information:  Patient Patient Interpreter Needed:  None Criminal Activity/Legal Involvement Pertinent to Current Situation/Hospitalization:  No - Comment as needed Significant Relationships:  Other(Comment) Lives with:  Spouse Do you feel safe going back to the place where you live?  Yes Need for family participation in patient care:  No (Coment)  Care giving concerns:  No care giving concerns identified.   Social Worker assessment / plan:  CSW met with pt to address consult for New SNF as a back up for CIR. CSW introduced herself and explained role of social work. CSW also explained process of discharging to SNF. Pt is agreeable to SNF if CIR is not an option. CSW will initiated SNF search and will follow up with bed offers. CSW will continue to follow.   Employment status:  Retired Insurance information:  Managed Medicare PT Recommendations:  Inpatient Rehab Consult Information / Referral to community resources:   Skilled Nursing Facilties   Patient/Family's Response to care:  Pt was appreciative of CSW support.   Patient/Family's Understanding of and Emotional Response to Diagnosis, Current Treatment, and Prognosis:  Pt understands that he is in need of STR prior to returning home.   Emotional Assessment Appearance:  Appears stated age Attitude/Demeanor/Rapport:   (appropriate) Affect (typically observed):  Accepting, Adaptable, Pleasant Orientation:  Oriented  to Self, Oriented to Place, Oriented to  Time, Oriented to Situation Alcohol / Substance use:  Not Applicable Psych involvement (Current and /or in the community):  No (Comment)  Discharge Needs  Concerns to be addressed:  Adjustment to Illness Readmission within the last 30 days:  No Current discharge risk:  Chronically ill Barriers to Discharge:  Continued Medical Work up    , LCSW 07/07/2016, 5:08 PM  

## 2016-07-08 LAB — BASIC METABOLIC PANEL
Anion gap: 7 (ref 5–15)
BUN: 15 mg/dL (ref 6–20)
CALCIUM: 8.9 mg/dL (ref 8.9–10.3)
CO2: 27 mmol/L (ref 22–32)
CREATININE: 0.81 mg/dL (ref 0.61–1.24)
Chloride: 104 mmol/L (ref 101–111)
GFR calc non Af Amer: >60 mL/min (ref >60)
Glucose, Bld: 93 mg/dL (ref 65–99)
Potassium: 3.7 mmol/L (ref 3.5–5.1)
SODIUM: 138 mmol/L (ref 135–145)

## 2016-07-08 NOTE — H&P (Signed)
Physical Medicine and Rehabilitation Admission H&P    Chief Complaint  Patient presents with  . difficulty walking  : HPI: Jack [name] is a 82 y.o. 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 Multilevel home with bed and bath upstairs. Presented on 07/03/16 with acute onset of right sided weakness. MRI/MRA brain done revealing small acute cortical/subcortical infarcts in left frontoparietal region, acute right occipital lobe infarcts and chronic cerebral and cerebellar infarcts. Patient did not receive TPA. Carotid dopplers without significant ICA stenosis. BLE dopplers negative for DVT. 2D echo with EF 60-65% with severely dilated LA, moderate TVR and mild LVH. Patient had refused TEE. Neurology consulted presently maintained on aspirin 325 mg daily as well as subcutaneous Lovenox for DVT prophylaxis. There was consideration of Eliquis secondary to cardioembolic stroke however patient had refused thus patient remained with aspirin therapy. Physical occupational therapy evaluations completed. Recommendations for physical medicine rehabilitation consult. Patient was admitted for a comprehensive rehabilitation program.  Review of Systems  Constitutional: Negative for chills and fever.  HENT: Positive for hearing loss. Negative for tinnitus.   Eyes: Positive for blurred vision. Negative for double vision and photophobia.  Respiratory: Negative for cough and shortness of breath.   Cardiovascular: Positive for leg swelling. Negative for chest pain and palpitations.  Gastrointestinal: Positive for constipation. Negative for nausea and vomiting.  Genitourinary: Positive for frequency and urgency.  Negative for dysuria and hematuria.  Musculoskeletal: Positive for myalgias.  Skin: Negative for rash.  Neurological: Positive for weakness. Negative for seizures and loss of consciousness.  Psychiatric/Behavioral: The patient has insomnia.   All other systems reviewed and are negative.  Past Medical History:  Diagnosis Date  . Atrial fibrillation ([place])    Dr. Tollie Eth  . BPH (benign prostatic hypertrophy) 2012   prior therapy with finasteride, but as of 03/2015 no c/o; Dr. Luanne Bras  . Cellulitis of face 11/08   [facility]  . CVA (cerebral infarction) 2009, 2015  . Echocardiogram abnormal 02/11/2010   LVEF 70%, severe mitral regurgitation, pulm HTN   . ED (erectile dysfunction)    Viagra prn, consult with Urology 2012  . Elevated PSA 2012   BPH; Dr. Luanne Bras, [facility]; no more PSAs needed as of 2012 consult  . Gait abnormality    uses cane, s/p CVA  . Hemiparesis affecting left side as late effect of cerebrovascular accident ([place])   . Hiatal hernia    per 2008 EUS  . Hyperlipidemia   . Hypertension   . Microscopic hematuria    chronic, eval 2012 with Urology, benign at that time  . Obstructive uropathy 2012   Dr. Luanne Bras  . Pancreatic mass 01/2006   inconsequential per CT 2007  . 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 Patent foramen ovale    hx/o, notation of fistula on echocardiogram from 2009  . Prophylactic antibiotic 2016   requires due to valve disease  . Schatzki's ring 2008   per endoscopic ultrasound; Dr. Janetta Hora. [name]  . Stroke ([place])   . Ventral hernia    surgery consult 01/2010 with Dr. Excell Seltzer, elective repair if  desired , but he declined  . Wears hearing aid    Past Surgical History:  Procedure Laterality Date  . CARDIAC CATHETERIZATION  02/2010   LVEF 70%, severe mitral regurgitation, pulm HTN; prior in 01/2006  . INGUINAL HERNIA REPAIR     bilat  .  MITRAL VALVE REPAIR  01/2006   mitral repair with Maze procedure, tricuspid annuloplasty, MAZE and LAA oversewn by Dr. Roxy Manns   Family History  Problem Relation Age of Onset  . Family history unknown: Yes   Social History:  reports that he has never smoked. He has never used smokeless tobacco. He reports that he does not drink alcohol or use drugs. Allergies:  Allergies  Allergen Reactions  . Valsartan Swelling    angioedema   Medications Prior to Admission  Medication Sig Dispense Refill  . amLODipine (NORVASC) 10 MG tablet Take 1 tablet (10 mg total) by mouth daily. 90 tablet 3  . aspirin EC 81 MG EC tablet Take 1 tablet (81 mg total) by mouth daily. 90 tablet 1  . Cholecalciferol (VITAMIN D3) 3000 UNITS TABS Take by mouth.    Marland Kitchen MAGNESIUM PO Take by mouth daily.    Marland Kitchen acetaminophen (TYLENOL) 325 MG tablet Take 1-2 tablets (325-650 mg total) by mouth every 4 (four) hours as needed for mild pain. (Patient not taking: Reported on 07/03/2016)    . hydrochlorothiazide (HYDRODIURIL) 25 MG tablet TAKE 1 TABLET BY MOUTH  DAILY 90 tablet 0  . pravastatin (PRAVACHOL) 20 MG tablet Take 1 tablet (20 mg total) by mouth daily. 90 tablet 1    Home: Home Living Family/patient expects to be discharged to:: Private residence Living Arrangements: Spouse/significant other Available Help at Discharge: Family Type of Home: House Home Access: Level entry Home Layout: Multi-level, Bed/bath upstairs, 1/2 bath on main level Alternate Level Stairs-Number of Steps: 2 sets of 7 steps  Alternate Level Stairs-Rails: Right Bathroom Shower/Tub: Tub/shower unit, Door, Multimedia programmer: Standard Bathroom Accessibility: Yes Home Equipment: Kasandra Knudsen - quad, Cane - single point, Environmental consultant - 2 wheels  Lives With: Spouse   Functional History: Prior Function Level of Independence: Independent with assistive device(s) Comments: independent without A.D. in house and with [facility] in community. Goes to the gym with  his wife 2 x week to walk. Community ambulator.  Functional Status:  Mobility: Bed Mobility Overal bed mobility: Needs Assistance Bed Mobility: Supine to Sit Supine to sit: Supervision, HOB elevated Sit to supine: Min guard, HOB elevated General bed mobility comments: increased time, no physical assist Transfers Overall transfer level: Needs assistance Equipment used: Rolling walker (2 wheeled) Transfers: Sit to/from Stand Sit to Stand: Mod assist Stand pivot transfers: Min assist General transfer comment: verbal cues for hand placement, steadying assist; small, but persistent posterior lean, requiring light mod assist to stay on his feet Ambulation/Gait Ambulation/Gait assistance: Min assist Ambulation Distance (Feet): 100 Feet Assistive device: Rolling walker (2 wheeled) Gait Pattern/deviations: Decreased stride length, Trunk flexed, Narrow base of support General Gait Details: verbal and tactile cues for posture, left lateral lean; focused on incr L stance stability with cues and tactile facilitation to activate L knee and hip extensors, and extend hip and knee in L stance; better R step length with facilitation as well Gait velocity: decreased Gait velocity interpretation: Below normal speed for age/gender    ADL: ADL Overall ADL's : Needs assistance/impaired Eating/Feeding: Minimal assistance, Sitting Eating/Feeding Details (indicate cue type and reason): assist to open containers, pt placed foam build up on  fork independently Grooming: Wash/dry hands, Standing, Min guard Upper Body Bathing: Minimal assistance, Sitting Upper Body Bathing Details (indicate cue type and reason): Pt unable to maintain static sitting balance when engaged in functional task using BUE. Falls to L Lower Body Bathing: Moderate assistance, Sit to/from stand Upper Body Dressing : Moderate assistance, Sitting Lower Body Dressing: Min guard, Sitting/lateral leans Lower Body Dressing Details (indicate  cue type and reason): donned socks with assist to balance at EOB, tends to lean L Toilet Transfer: Minimal assistance, Ambulation, RW, BSC (over toilet) Toileting- Clothing Manipulation and Hygiene: Moderate assistance, Sit to/from stand Functional mobility during ADLs: Minimal assistance, Rolling walker, Cueing for safety, Cueing for sequencing  Cognition: Cognition Overall Cognitive Status: History of cognitive impairments - at baseline Arousal/Alertness: Awake/alert Orientation Level: Oriented X4 Attention: Focused Focused Attention: Appears intact Memory: Impaired Memory Impairment: Retrieval deficit Awareness: Impaired Awareness Impairment: Emergent impairment Cognition Arousal/Alertness: Awake/alert Behavior During Therapy: WFL for tasks assessed/performed Overall Cognitive Status: History of cognitive impairments - at baseline General Comments: Pt recalling events of last OT session yesterday.  Physical Exam: Blood pressure 124/67, pulse 71, temperature 97.8 F (36.6 C), temperature source Oral, resp. rate 20, height '5\' 4"'$  (1.626 m), weight 66.9 kg (147 lb 7.8 oz), SpO2 97 %. Physical Exam  Constitutional: He appears well-developed.  HENT:  Head: Normocephalic and atraumatic.  Eyes: EOM are normal.  Neck: Normal range of motion. Neck supple. No JVD present. No tracheal deviation present. No thyromegaly present.  Cardiovascular: Normal rate and regular rhythm.  Exam reveals no friction rub.   No murmur heard. Respiratory: No respiratory distress. He has no wheezes. He has no rales.  Decreased breath sounds at the bases  GI: Soft. Bowel sounds are normal. He exhibits no distension. There is no tenderness.  Musculoskeletal: He exhibits no edema.  Skin: Skin is warm and dry.  Musculoskeletal: He exhibits no edema or tenderness.  Neurological: He is alert and oriented to person, place, and time.  HOH.  Soft, slow speech.  Able to follow basic motor commands.  Sensation  diminished to light touch LUE  Motor: RUE: 4/5 deltoid, bicep, tricep, wrist, HI. RLE: 4- HF, 4/5 KE and ADF/PF LUE: 4-/5 deltoid, bicep, tricep, wrist, HI. LLE: 4- HF,KE and ADF/PF Mild right limb ataxia DTRs 3+ LUE/LLE  Skin: Skin is warm and dry.  Psychiatric: His mood appears appropriate/cooperative  Results for orders placed or performed during the hospital encounter of 07/03/16 (from the past 48 hour(s))  Basic metabolic panel     Status: None   Collection Time: 07/07/16  5:19 AM  Result Value Ref Range   Sodium 138 135 - 145 mmol/L   Potassium 3.5 3.5 - 5.1 mmol/L   Chloride 104 101 - 111 mmol/L   CO2 28 22 - 32 mmol/L   Glucose, Bld 85 65 - 99 mg/dL   BUN 13 6 - 20 mg/dL   Creatinine, Ser 0.77 0.61 - 1.24 mg/dL   Calcium 9.3 8.9 - 10.3 mg/dL   GFR calc non Af Amer >60 >60 mL/min   GFR calc Af Amer >60 >60 mL/min    Comment: (NOTE) The eGFR has been calculated using the CKD EPI equation. This calculation has not been validated in all clinical situations. eGFR's persistently <60 mL/min signify possible Chronic Kidney Disease.    Anion gap 6 5 - 15   No results found.     Medical Problem List and Plan: 1. New right hemiplegia gait disorder  secondary  to acute cortical/subcortical infarct left high frontoparietal region as well as history of hemorrhagic CVA in the past with chronic spastic left hemiparesis  -admit to inpatient rehab 2.  DVT Prophylaxis/Anticoagulation: Subcutaneous Lovenox. Venous Doppler studies negative 3. Pain Management: Tylenol as needed 4. Hypertension/atrial fibrillation. Norvasc 10 mg daily. Cardiac rate controlled at present. Follow-up cardiology services. Patient currently refusing Eliquis and remains on aspirin 5. Neuropsych: This patient is capable of making decisions on his own behalf. 6. Skin/Wound Care: Routine skin checks 7. Fluids/Electrolytes/Nutrition: Routine I&O with follow-up chemistries  -encourage PO 8. Hyperlipidemia.  Pravachol     Post Admission Physician Evaluation: 1. Functional deficits secondary  to acute left fronto-parietal infarct. 2. Patient is admitted to receive collaborative, interdisciplinary care between the physiatrist, rehab nursing staff, and therapy team. 3. Patient's level of medical complexity and substantial therapy needs in context of that medical necessity cannot be provided at a lesser intensity of care such as a SNF. 4. Patient has experienced substantial functional loss from his/her baseline which was documented above under the "Functional History" and "Functional Status" headings.  Judging by the patient's diagnosis, physical exam, and functional history, the patient has potential for functional progress which will result in measurable gains while on inpatient rehab.  These gains will be of substantial and practical use upon discharge  in facilitating mobility and self-care at the household level. 5. Physiatrist will provide 24 hour management of medical needs as well as oversight of the therapy plan/treatment and provide guidance as appropriate regarding the interaction of the two. 6. The Preadmission Screening has been reviewed and patient status is unchanged unless otherwise stated above. 7. 24 hour rehab nursing will assist with bladder management, bowel management, safety, skin/wound care, disease management, medication administration, pain management and patient education  and help integrate therapy concepts, techniques,education, etc. 8. PT will assess and treat for/with: Lower extremity strength, range of motion, stamina, balance, functional mobility, safety, adaptive techniques and equipment, NMR, community reintegration.   Goals are: mod I. 9. OT will assess and treat for/with: ADL's, functional mobility, safety, upper extremity strength, adaptive techniques and equipment, NMR, ego support, community reintegration.   Goals are: mod I. Therapy may proceed with showering this  patient. 10. SLP will assess and treat for/with: n/a.  Goals are: n/a. 11. Case Management and Social Worker will assess and treat for psychological issues and discharge planning. 12. Team conference will be held weekly to assess progress toward goals and to determine barriers to discharge. 13. Patient will receive at least 3 hours of therapy per day at least 5 days per week. 14. ELOS: 7-8 days       15. Prognosis:  excellent     Meredith Staggers, MD, [facility] 07/09/2016  Cathlyn Parsons., PA-C 07/08/2016

## 2016-07-08 NOTE — Care Management Note (Signed)
Case Management Note  Patient Details  Name: Jack [name] MRN: [id number] Date of Birth: 1933-11-16  Subjective/Objective:                    Action/Plan: Plan is for CIR vs SNF. CM following for discharge disposition.   Expected Discharge Date:                  Expected Discharge Plan:     In-House Referral:     Discharge planning Services     Post Acute Care Choice:    Choice offered to:     DME Arranged:    DME Agency:     HH Arranged:    HH Agency:     Status of Service:     If discussed at [employer], dates discussed:    Additional Comments:  Pollie Friar, RN 07/08/2016, 4:05 PM

## 2016-07-08 NOTE — Progress Notes (Signed)
PROGRESS NOTE  Jack [name] [id number] DOB: November 09, 1933 DOA: 07/03/2016 PCP: Crisoforo Oxford, PA-C   LOS: 3 days   Brief Narrative: Jack [name] a 82 y.o.malewith medical history significant ofHTN, HLD,CVA with residual left-sided weakness, atrial fibrillation, s/p MVR;presents with complaints of right-sided weakness. Patient notes that he got up this morning around 8 AM and was able to walk with the assistance of a cane to eat his breakfast. Following breakfast he noted that he had difficulty utilizing his right arm that he uses to hold his cane. He was also noted to have weakness in right lower extremity as well. Patient has had irregular heart beat for a few years now, but has never been on anticoagulation. Takes baby aspirin. Followed by Dr. Wynonia Lawman of cardiology. In reviewing cardiology records from Dr. Wynonia Lawman from 2015 it appears that patient was thought not to have been in A. fib, question holtermonitor, and therefore was deemed not to be a candidate for anticoagulation at that time. It appears patient also was noted to have history of intercranial hemorrhage .  Assessment & Plan: Principal Problem:   Right sided weakness Active Problems:   History of atrial fibrillation   Hx of mitral valve repair   History of stroke   Essential hypertension   TIA (transient ischemic attack)   Embolic stroke involving left middle cerebral artery (HCC)   Acute CVA (cerebrovascular accident) ([place])   PFO (patent foramen ovale)   History of CVA with residual deficit   Benign essential HTN   Non compliance with medical treatment   Hypokalemia   Acute blood loss anemia   Left frontoparietal stroke  - History of CVA 2009 with left-sided residual weakness, history of [place] 2015 - Carotid doppler: Preliminary report: 1-39% ICA plaquing. Vertebral artery flow is antegrade.  - Stroke team, PT OT SLP  - Ha1c 5.5 - Echo with EF 60-65%, LAE, dilated Rv, severe pulmonary HTN RVSP 152mmHG    - Continue aspirin for now, Dr. Maylene Roes discussed with patient, does not wish anticoagulation   - awaiting insurance decision whether CIR stay will be covered  Paroxysmal A Fib, currently in normal sinus - Chadsvascscore= 5. S/p Maze procedure as well as amputation of atrial appendage in the past. Had ICH and was deemed not a candidate for anticoagulation  - Continue aspirin for now. Recommend anticoagulation with eliquis to prevent stroke, but does have hx of ICH in 2015. Dr. Maylene Roes discussed the risks and benefits of eliquis vs aspirin alone. Currently, he is refusing anticoagulation even if it means he could have another stroke.  - EP Dr. Rayann Heman consulted for consideration for left atrial appendage occlusion using watchman device, but given that he had LAA oversewn surgically, he is not a candidate for Watchman LAAO device - recommending TEE however patient is refusing. D/w Dr. Gwenlyn Found today   Essential hypertension - Amlodipine  HLD - continue statin   DVT prophylaxis: Lovenox Code Status: Full Family Communication: family bedside Disposition Plan: CIR pending approval.   Consultants:   Cardiology   EP  Neurology   Procedures:   2D echo: EF  60-65%, severe pulmonary HTN  Antimicrobials:  None   Subjective: - no chest pain, shortness of breath, no abdominal pain, nausea or vomiting. Feeling well.  Objective: Vitals:   07/08/16 0120 07/08/16 0515 07/08/16 0909 07/08/16 1031  BP: (!) 146/65 124/67 130/75 128/68  Pulse: 89 71 84 74  Resp: 20 20 18 16   Temp: 98.5 F (36.9 C) 97.8  F (36.6 C) 98.1 F (36.7 C) 98.2 F (36.8 C)  TempSrc: Oral Oral Oral Oral  SpO2: 98% 97% 97% 97%  Weight:      Height:        Intake/Output Summary (Last 24 hours) at 07/08/16 1429 Last data filed at 07/07/16 1600  Gross per 24 hour  Intake              160 ml  Output                0 ml  Net              160 ml   Filed Weights   07/03/16 1212 07/07/16 0000  Weight: 66.9 kg  (147 lb 8 oz) 66.9 kg (147 lb 7.8 oz)    Examination: Constitutional: NAD Vitals:   07/08/16 0120 07/08/16 0515 07/08/16 0909 07/08/16 1031  BP: (!) 146/65 124/67 130/75 128/68  Pulse: 89 71 84 74  Resp: 20 20 18 16   Temp: 98.5 F (36.9 C) 97.8 F (36.6 C) 98.1 F (36.7 C) 98.2 F (36.8 C)  TempSrc: Oral Oral Oral Oral  SpO2: 98% 97% 97% 97%  Weight:      Height:       Eyes: PERRL, lids and conjunctivae normal Respiratory: clear to auscultation bilaterally, no wheezing, no crackles.  Cardiovascular: Regular rate and rhythm, no murmurs / rubs / gallops. No LE edema.   Data Reviewed: I have personally reviewed following labs and imaging studies  CBC:  Recent Labs Lab 07/03/16 1400 07/04/16 0220  WBC 8.0 9.0  NEUTROABS 5.8  --   HGB 12.9* 12.1*  HCT 38.9* 37.0*  MCV 89.2 88.7  PLT 295 0000000   Basic Metabolic Panel:  Recent Labs Lab 07/04/16 0220 07/05/16 1141 07/06/16 0525 07/07/16 0519 07/08/16 0714  NA 139 135 137 138 138  K 3.4* 3.5 3.4* 3.5 3.7  CL 106 102 103 104 104  CO2 26 27 26 28 27   GLUCOSE 83 115* 97 85 93  BUN 10 10 11 13 15   CREATININE 0.78 0.82 0.86 0.77 0.81  CALCIUM 8.7* 8.8* 9.2 9.3 8.9  MG 1.9  --   --   --   --    GFR: Estimated Creatinine Clearance: 60.9 mL/min (by C-G formula based on SCr of 0.81 mg/dL). Liver Function Tests: No results for input(s): AST, ALT, ALKPHOS, BILITOT, PROT, ALBUMIN in the last 168 hours. No results for input(s): LIPASE, AMYLASE in the last 168 hours. No results for input(s): AMMONIA in the last 168 hours. Coagulation Profile: No results for input(s): INR, PROTIME in the last 168 hours. Cardiac Enzymes: No results for input(s): CKTOTAL, CKMB, CKMBINDEX, TROPONINI in the last 168 hours. BNP (last 3 results) No results for input(s): PROBNP in the last 8760 hours. HbA1C: No results for input(s): HGBA1C in the last 72 hours. CBG: No results for input(s): GLUCAP in the last 168 hours. Lipid Profile: No  results for input(s): CHOL, HDL, LDLCALC, TRIG, CHOLHDL, LDLDIRECT in the last 72 hours. Thyroid Function Tests: No results for input(s): TSH, T4TOTAL, FREET4, T3FREE, THYROIDAB in the last 72 hours. Anemia Panel: No results for input(s): VITAMINB12, FOLATE, FERRITIN, TIBC, IRON, RETICCTPCT in the last 72 hours. Urine analysis:    Component Value Date/Time   COLORURINE YELLOW 07/03/2016 1400   APPEARANCEUR CLEAR 07/03/2016 1400   LABSPEC 1.006 07/03/2016 1400   PHURINE 8.0 07/03/2016 1400   GLUCOSEU NEGATIVE 07/03/2016 1400   HGBUR NEGATIVE 07/03/2016 1400  BILIRUBINUR NEGATIVE 07/03/2016 1400   BILIRUBINUR n 04/02/2015 1500   KETONESUR NEGATIVE 07/03/2016 1400   PROTEINUR NEGATIVE 07/03/2016 1400   UROBILINOGEN negative 04/02/2015 1500   UROBILINOGEN 0.2 08/02/2013 1206   NITRITE NEGATIVE 07/03/2016 1400   LEUKOCYTESUR NEGATIVE 07/03/2016 1400   Sepsis Labs: Invalid input(s): PROCALCITONIN, LACTICIDVEN  No results found for this or any previous visit (from the past 240 hour(s)).    Radiology Studies: No results found.   Scheduled Meds: . amLODipine  10 mg Oral Daily  . aspirin EC  325 mg Oral Once  . aspirin EC  325 mg Oral Daily  . enoxaparin (LOVENOX) injection  40 mg Subcutaneous Daily  . pravastatin  40 mg Oral q1800   Continuous Infusions: . sodium chloride Stopped (07/07/16 1607)    Marzetta Board, MD, PhD [facility] Pager [phone]  If 7PM-7AM, please contact night-coverage [email or web address] Password [facility] 07/08/2016, 2:29 PM

## 2016-07-08 NOTE — Clinical Social Work Note (Signed)
CSW met with patient and his wife to present bed offers. Patient's wife stated they still prefer to wait on the insurance appeal for CIR even with the possibility of being charged for an extended stay.  RNCM notified.  Jack [name], [place]

## 2016-07-08 NOTE — Progress Notes (Addendum)
Physical Therapy Treatment Patient Details Name: Jack [name] MRN: [id number] DOB: 1934-02-26 Today's Date: 07/08/2016    History of Present Illness 82 y.o. male with a history of stroke 2 years ago with residual left hemiparesis, hypertension, hyperlipidemia and atrial fibrillation not on anticoagulation, seen in the ED at [facility] with a complaint of new onset right-sided weakness.  MRI revealed small acute cortical/subcortical infarcts in the high left frontoparietal region and punctate acute right occipital lobe cortical infarcts.     PT Comments    Pt progressing well during session.  Pt remains to presents with balance and coordination deficits.  Informed wife to bring in RW from home for evaluation of height.  Pt remains to benefit from rehab in a post acute setting to address these deficits.  Family training performed with patient during session.  Will continue to assess patient during hospitalization.   Follow Up Recommendations  CIR;Supervision/Assistance - 24 hour     Equipment Recommendations       Recommendations for Other Services Rehab consult     Precautions / Restrictions Precautions Precautions: Fall Restrictions Weight Bearing Restrictions: No    Mobility  Bed Mobility               General bed mobility comments: Pt OOB in recliner.    Transfers Overall transfer level: Needs assistance Equipment used: Rolling walker (2 wheeled) (would benefit from youth RW.  ) Transfers: Sit to/from Stand Sit to Stand: Min guard Stand pivot transfers: Min guard       General transfer comment: Cues for hand placement as patient reaching for RW for assist.    Ambulation/Gait Ambulation/Gait assistance: Min guard Ambulation Distance (Feet): 100 Feet  Additional trial of 60 feet during family training.     Gait Pattern/deviations: Decreased stride length;Trunk flexed;Narrow base of support Gait velocity: decreased Gait velocity interpretation: Below normal speed  for age/gender General Gait Details: verbal and tactile cues for posture, left lateral lean; focused on incr L stance stability with cues and tactile facilitation to activate L knee and hip extensors, and extend hip and knee in L stance.  Cues to icrease BOS and ER L hip as he tends to IR with swing phase.     Stairs Stairs:  (will try stairs next session.  )          Wheelchair Mobility    Modified Rankin (Stroke Patients Only) Modified Rankin (Stroke Patients Only) Pre-Morbid Rankin Score: Slight disability Modified Rankin: Moderately severe disability     Balance Overall balance assessment: Needs assistance Sitting-balance support: Feet supported Sitting balance-Leahy Scale: Fair       Standing balance-Leahy Scale: Poor                      Cognition Arousal/Alertness: Awake/alert Behavior During Therapy: WFL for tasks assessed/performed Overall Cognitive Status: History of cognitive impairments - at baseline                      Exercises Other Exercises Other Exercises: reapeated chair push-ups x10 reps.      General Comments        Pertinent Vitals/Pain Pain Assessment: No/denies pain    Home Living                      Prior Function            PT Goals (current goals can now be found in the care plan section)  Acute Rehab PT Goals Patient Stated Goal: independence Potential to Achieve Goals: Good Progress towards PT goals: Progressing toward goals    Frequency           PT Plan Current plan remains appropriate    Co-evaluation             End of Session Equipment Utilized During Treatment: Gait belt Activity Tolerance: Patient tolerated treatment well Patient left: in chair;with call bell/phone within reach;with chair alarm set;with family/visitor present     Time: [id number] PT Time Calculation (min) (ACUTE ONLY): 18 min  Charges:  $Gait Training: 8-22 mins                    G Codes:      Cristela Blue 08-02-2016, 5:07 PM  Governor Rooks, PTA pager [phone]

## 2016-07-08 NOTE — Progress Notes (Signed)
We await decision from [facility] for our expedited appeal to admit pt to inpt acute rehab. I met with pt and his wife at bedside to answer their questions concerning the appeal process through their payer. [phone]

## 2016-07-08 NOTE — Progress Notes (Signed)
Telemetry called to notify that patient just had an 8 beat run of v-tach.

## 2016-07-08 NOTE — PMR Pre-admission (Signed)
PMR Admission Coordinator Pre-Admission Assessment  Patient: Jack [name] is an 82 y.o., male MRN: [id number] DOB: 09-14-1933 Height: 5\' 4"  (162.6 cm) Weight: 66.9 kg (147 lb 7.8 oz)              Insurance Information HMO:   PPO: yes     PCP:      IPA:      80/20:      OTHER: medicare advantage plan PRIMARY: [place]      Policy#: [id number]      Subscriber: pt CM Name: Orvan July      Phone#: [phone]     Fax#: [phone] Pre-Cert#: [id number] denied on 12/27 expedited appeal with [facility] at phone [phone] fax [phone] auth # [id number] f/u CM is Sharlett Iles at phone [phone] fax: EPIC access   Employer: retired Benefits:  Phone #: [phone]     Name: 07/07/2016 Eff. Date: 02/09/14     Deduct: $200      Out of Pocket Max: $2200      Life Max: none CIR: no co pay covers 100%      SNF: no co pay days  Up to 100 days Outpatient: no co pay      Co-Pay: visits per medical necessity Home Health: 100%      Co-Pay: visits per medical necessity DME: no co pay      Co-Pay:  Providers: in network  SECONDARY: none      Medicaid Application Date:       Case Manager:  Disability Application Date:       Case Worker:   Emergency Facilities manager Information    Name Relation Home Work Mobile   [place]       Current Medical History  Patient Admitting Diagnosis: acute cortical/subcortical infarcts in left frontoparietal region, acute right occipital lobe infarcts  History of Present Illness:       : HPI: Jack [name] a 83 y.o.LH-male with history of A fib maintained on aspirin 81 mg daily, PFO, hemorrhagic CVA with spastic left hemiparesis 2 years ago, memory deficits and gait disorder, HTN . Presented on 07/03/16 with acute onset of right sided weakness. MRI/MRA brain done revealing small acute cortical/subcortical infarcts in left frontoparietal region, acute right occipital lobe infarcts and chronic cerebral and  cerebellar infarcts. Patient did not receive TPA. Carotid dopplers without significant ICA stenosis. BLE dopplers negative for DVT. 2D echo with EF 60-65% with severely dilated LA, moderate TVR and mild LVH. Patient had refused TEE. Neurology consulted presently maintained on aspirin 325 mg daily as well as subcutaneous Lovenox for DVT prophylaxis. There was consideration of Eliquis secondary to cardioembolic stroke however patient had refused thus patient remained with aspirin therapy.   Total: 3 NIHSS  Past Medical History  Past Medical History:  Diagnosis Date  . Atrial fibrillation ([place])    Dr. Tollie Eth  . BPH (benign prostatic hypertrophy) 2012   prior therapy with finasteride, but as of 03/2015 no c/o; Dr. Luanne Bras  . Cellulitis of face 11/08   [facility]  . CVA (cerebral infarction) 2009, 2015  . Echocardiogram abnormal 02/11/2010   LVEF 70%, severe mitral regurgitation, pulm HTN   . ED (erectile dysfunction)    Viagra prn, consult with Urology 2012  . Elevated PSA 2012   BPH; Dr. Luanne Bras, [facility]; no more PSAs needed as of 2012 consult  . Gait abnormality    uses cane, s/p CVA  .  Hemiparesis affecting left side as late effect of cerebrovascular accident ([place])   . Hiatal hernia    per 2008 EUS  . Hyperlipidemia   . Hypertension   . Microscopic hematuria    chronic, eval 2012 with Urology, benign at that time  . Obstructive uropathy 2012   Dr. Luanne Bras  . Pancreatic mass 01/2006   inconsequential per CT 2007  . 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 Patent foramen ovale    hx/o, notation of fistula on echocardiogram from 2009  . Prophylactic antibiotic 2016   requires due to valve disease  . Schatzki's ring 2008   per endoscopic ultrasound; Dr. Janetta Hora. [name]  . Stroke ([place])   . Ventral hernia    surgery consult 01/2010 with Dr. Excell Seltzer, elective repair if desired ,  but he declined  . Wears hearing aid     Family History  Family history is unknown by patient.  Prior Rehab/Hospitalizations:  Has the patient had major surgery during 100 days prior to admission? No  CIR 2015   Current Medications   Current Facility-Administered Medications:  .  0.9 %  sodium chloride infusion, , Intravenous, Continuous, Renee Dyane Dustman, PA-C, Stopped at 07/07/16 1607 .  acetaminophen (TYLENOL) tablet 650 mg, 650 mg, Oral, Q4H PRN **OR** acetaminophen (TYLENOL) solution 650 mg, 650 mg, Per Tube, Q4H PRN **OR** acetaminophen (TYLENOL) suppository 650 mg, 650 mg, Rectal, Q4H PRN, Norval Morton, MD .  amLODipine (NORVASC) tablet 10 mg, 10 mg, Oral, Daily, Norval Morton, MD, 10 mg at 07/08/16 0911 .  aspirin EC tablet 325 mg, 325 mg, Oral, Once, Alexandra M Law, PA-C .  aspirin EC tablet 325 mg, 325 mg, Oral, Daily, Norval Morton, MD, 325 mg at 07/08/16 0911 .  diphenhydrAMINE (BENADRYL) capsule 25 mg, 25 mg, Oral, Once PRN, Wallie Char .  enoxaparin (LOVENOX) injection 40 mg, 40 mg, Subcutaneous, Daily, Norval Morton, MD, 40 mg at 07/08/16 0911 .  pravastatin (PRAVACHOL) tablet 40 mg, 40 mg, Oral, q1800, David L Rinehuls, PA-C, 40 mg at 07/07/16 1742 .  senna-docusate (Senokot-S) tablet 1 tablet, 1 tablet, Oral, QHS PRN, Norval Morton, MD  Patients Current Diet: Diet Heart Room service appropriate? Yes; Fluid consistency: Thin  Precautions / Restrictions Precautions Precautions: Fall Restrictions Weight Bearing Restrictions: No   Has the patient had 2 or more falls or a fall with injury in the past year?No  Prior Activity Level Pt is Mod I with a straight cane in the community and usually no device in the home. Does not drive. Goes to silver sneakers gym 6 days per week.  Home Assistive Devices / Equipment Home Assistive Devices/Equipment: Wellsite geologist, Hearing aid Home Equipment: Cane - quad, Cane - single point, Environmental consultant - 2 wheels  Prior Device  Use: Indicate devices/aids used by the patient prior to current illness, exacerbation or injury? cane  Prior Functional Level Prior Function Level of Independence: Independent with assistive device(s) Comments: independent without A.D. in house and with [facility] in community. Goes to the gym with his wife 2 x week to walk. Community ambulator.  Self Care: Did the patient need help bathing, dressing, using the toilet or eating?  Independent  Indoor Mobility: Did the patient need assistance with walking from room to room (with or without device)? Independent  Stairs: Did the patient need assistance with internal or external stairs (with or without device)? Independent  Functional Cognition: Did the patient  need help planning regular tasks such as shopping or remembering to take medications? Needed some help  Current Functional Level Cognition  Arousal/Alertness: Awake/alert Overall Cognitive Status: History of cognitive impairments - at baseline Orientation Level: Oriented X4 General Comments: Pt recalling events of last OT session yesterday. Attention: Focused Focused Attention: Appears intact Memory: Impaired Memory Impairment: Retrieval deficit Awareness: Impaired Awareness Impairment: Emergent impairment    Extremity Assessment (includes Sensation/Coordination)  Upper Extremity Assessment: RUE deficits/detail, LUE deficits/detail RUE Deficits / Details: @ 4/5 throughout. Appears to have ataxic movements  RUE Coordination: decreased fine motor LUE Deficits / Details: at baseline. ataxia. sensory deficits. States it feels "numb" LUE Sensation: decreased light touch, decreased proprioception  Lower Extremity Assessment: Defer to PT evaluation RLE Deficits / Details: grossly 4/5 LLE Deficits / Details: grossly 4-/5 LLE Sensation: decreased proprioception LLE Coordination: decreased fine motor    ADLs  Overall ADL's : Needs assistance/impaired Eating/Feeding: Minimal assistance,  Sitting Eating/Feeding Details (indicate cue type and reason): assist to open containers, pt placed foam build up on fork independently Grooming: Wash/dry hands, Standing, Min guard Upper Body Bathing: Minimal assistance, Sitting Upper Body Bathing Details (indicate cue type and reason): Pt unable to maintain static sitting balance when engaged in functional task using BUE. Falls to L Lower Body Bathing: Moderate assistance, Sit to/from stand Upper Body Dressing : Moderate assistance, Sitting Lower Body Dressing: Min guard, Sitting/lateral leans Lower Body Dressing Details (indicate cue type and reason): donned socks with assist to balance at EOB, tends to lean L Toilet Transfer: Minimal assistance, Ambulation, RW, BSC (over toilet) Toileting- Clothing Manipulation and Hygiene: Moderate assistance, Sit to/from stand Functional mobility during ADLs: Minimal assistance, Rolling walker, Cueing for safety, Cueing for sequencing    Mobility  Overal bed mobility: Needs Assistance Bed Mobility: Supine to Sit Supine to sit: Supervision, HOB elevated Sit to supine: Min guard, HOB elevated General bed mobility comments: increased time, no physical assist    Transfers  Overall transfer level: Needs assistance Equipment used: Rolling walker (2 wheeled) Transfers: Sit to/from Stand Sit to Stand: Mod assist Stand pivot transfers: Min assist General transfer comment: verbal cues for hand placement, steadying assist; small, but persistent posterior lean, requiring light mod assist to stay on his feet    Ambulation / Gait / Stairs / Wheelchair Mobility  Ambulation/Gait Ambulation/Gait assistance: Museum/gallery curator (Feet): 100 Feet Assistive device: Rolling walker (2 wheeled) Gait Pattern/deviations: Decreased stride length, Trunk flexed, Narrow base of support General Gait Details: verbal and tactile cues for posture, left lateral lean; focused on incr L stance stability with cues and  tactile facilitation to activate L knee and hip extensors, and extend hip and knee in L stance; better R step length with facilitation as well Gait velocity: decreased Gait velocity interpretation: Below normal speed for age/gender    Posture / Balance Dynamic Sitting Balance Sitting balance - Comments: Leans L with donning socks Balance Overall balance assessment: Needs assistance Sitting-balance support: Feet supported Sitting balance-Leahy Scale: Poor Sitting balance - Comments: Leans L with donning socks Postural control: Posterior lean, Left lateral lean Standing balance support: Single extremity supported Standing balance-Leahy Scale: Poor    Special needs/care consideration BiPAP/CPAP  N/a CPM  N/a Continuous Drip IV  N/a Dialysis  N/a Life Vest  N/a Oxygen  N/a Special Bed  N/a Trach Size  N/a Wound Vac (area)  N/a Skin intact  Bowel mgmt: continent Bladder mgmt: continent Diabetic mgmt  N/a   Previous Home Environment Living Arrangements: Spouse/significant other  Lives With: Spouse Available Help at Discharge: Family Type of Home: House Home Layout: Multi-level, Bed/bath upstairs, 1/2 bath on main level Alternate Level Stairs-Rails: Right Alternate Level Stairs-Number of Steps: 2 sets of 7 steps  Home Access: Level entry Bathroom Shower/Tub: Tub/shower unit, Door, Multimedia programmer: Standard Bathroom Accessibility: Yes How Accessible: Accessible via walker [place]: No  Discharge Living Setting Plans for Discharge Living Setting: Patient's home, Lives with (comment) (wife) Type of Home at Discharge: House Discharge Home Layout: Multi-level, Bed/bath upstairs, 1/2 bath on main level Alternate Level Stairs-Rails: Right Alternate Level Stairs-Number of Steps: 2 sets of 7 steps Discharge Home Access: Level entry Discharge Bathroom Shower/Tub: Tub/shower unit, Walk-in shower Discharge Bathroom Toilet:  Standard Discharge Bathroom Accessibility: Yes How Accessible: Accessible via walker Does the patient have any problems obtaining your medications?: No  Social/Family/Support Systems Patient Roles: Spouse Contact Information: Equities trader, wife Anticipated Caregiver: wife Anticipated Ambulance person Information: see above Ability/Limitations of Caregiver: no limitations Caregiver Availability: 24/7 Discharge Plan Discussed with Primary Caregiver: Yes Is Caregiver In Agreement with Plan?: Yes Does Caregiver/Family have Issues with Lodging/Transportation while Pt is in Rehab?: No  Goals/Additional Needs Patient/Family Goal for Rehab: supervision to Mod I with PT and OT Expected length of stay: ELOS 12-16 days Pt/Family Agrees to Admission and willing to participate: Yes Program Orientation Provided & Reviewed with Pt/Caregiver Including Roles  & Responsibilities: Yes  Decrease burden of Care through IP rehab admission: n/a  Possible need for SNF placement upon discharge: not anticipated  Patient Condition: This patient's medical and functional status has changed since the consult dated: 07/06/2016 in which the Rehabilitation Physician determined and documented that the patient's condition is appropriate for intensive rehabilitative care in an inpatient rehabilitation facility. See "History of Present Illness" (above) for medical update. Functional changes are: overall min to mod assist. Patient's medical and functional status update has been discussed with the Rehabilitation physician and patient remains appropriate for inpatient rehabilitation. Will admit to inpatient rehab today.  Preadmission Screen Completed By:  Cleatrice Burke, 07/08/2016 2:59 PM ______________________________________________________________________   Discussed status with Dr. Naaman Plummer on 07/09/2016 at  1014 and received telephone approval for admission today.  Admission Coordinator:  Cleatrice Burke,  time [id number] Date 07/09/2016

## 2016-07-09 ENCOUNTER — Inpatient Hospital Stay (HOSPITAL_COMMUNITY)
Admission: RE | Admit: 2016-07-09 | Discharge: 2016-07-16 | DRG: 057 | Disposition: A | Discharge status: Home / Self Care | Payer: Medicare Other | Source: Intra-hospital | Attending: Physical Medicine & Rehabilitation | Admitting: Physical Medicine & Rehabilitation

## 2016-07-09 DIAGNOSIS — R269 Unspecified abnormalities of gait and mobility: Secondary | ICD-10-CM

## 2016-07-09 DIAGNOSIS — I639 Cerebral infarction, unspecified: Secondary | ICD-10-CM

## 2016-07-09 DIAGNOSIS — Z7982 Long term (current) use of aspirin: Secondary | ICD-10-CM | POA: Insufficient documentation

## 2016-07-09 DIAGNOSIS — Q211 Atrial septal defect: Secondary | ICD-10-CM

## 2016-07-09 DIAGNOSIS — I10 Essential (primary) hypertension: Secondary | ICD-10-CM | POA: Diagnosis not present

## 2016-07-09 DIAGNOSIS — Z79899 Other long term (current) drug therapy: Secondary | ICD-10-CM | POA: Diagnosis not present

## 2016-07-09 DIAGNOSIS — Z23 Encounter for immunization: Secondary | ICD-10-CM | POA: Diagnosis not present

## 2016-07-09 DIAGNOSIS — E785 Hyperlipidemia, unspecified: Secondary | ICD-10-CM

## 2016-07-09 DIAGNOSIS — I4891 Unspecified atrial fibrillation: Secondary | ICD-10-CM | POA: Diagnosis not present

## 2016-07-09 DIAGNOSIS — N390 Urinary tract infection, site not specified: Secondary | ICD-10-CM | POA: Diagnosis not present

## 2016-07-09 DIAGNOSIS — I482 Chronic atrial fibrillation: Secondary | ICD-10-CM | POA: Diagnosis not present

## 2016-07-09 DIAGNOSIS — I69398 Other sequelae of cerebral infarction: Secondary | ICD-10-CM

## 2016-07-09 DIAGNOSIS — B9689 Other specified bacterial agents as the cause of diseases classified elsewhere: Secondary | ICD-10-CM | POA: Diagnosis not present

## 2016-07-09 DIAGNOSIS — G8110 Spastic hemiplegia affecting unspecified side: Secondary | ICD-10-CM | POA: Diagnosis present

## 2016-07-09 DIAGNOSIS — I69353 Hemiplegia and hemiparesis following cerebral infarction affecting right non-dominant side: Secondary | ICD-10-CM | POA: Diagnosis present

## 2016-07-09 DIAGNOSIS — I69152 Hemiplegia and hemiparesis following nontraumatic intracerebral hemorrhage affecting left dominant side: Secondary | ICD-10-CM | POA: Diagnosis not present

## 2016-07-09 DIAGNOSIS — R350 Frequency of micturition: Secondary | ICD-10-CM | POA: Diagnosis not present

## 2016-07-09 LAB — BASIC METABOLIC PANEL
ANION GAP: 7 (ref 5–15)
BUN: 14 mg/dL (ref 6–20)
CALCIUM: 8.7 mg/dL — AB (ref 8.9–10.3)
CHLORIDE: 103 mmol/L (ref 101–111)
CO2: 27 mmol/L (ref 22–32)
Creatinine, Ser: 0.86 mg/dL (ref 0.61–1.24)
GFR calc non Af Amer: >60 mL/min (ref >60)
GLUCOSE: 95 mg/dL (ref 65–99)
POTASSIUM: 3.7 mmol/L (ref 3.5–5.1)
Sodium: 137 mmol/L (ref 135–145)

## 2016-07-09 LAB — CBC
HEMATOCRIT: 37.3 % — AB (ref 39.0–52.0)
Hemoglobin: 12.6 g/dL — ABNORMAL LOW (ref 13.0–17.0)
MCH: 29.4 pg (ref 26.0–34.0)
MCHC: 33.8 g/dL (ref 30.0–36.0)
MCV: 87.1 fL (ref 78.0–100.0)
PLATELETS: 300 10*3/uL (ref 150–400)
RBC: 4.28 MIL/uL (ref 4.22–5.81)
RDW: 14.3 % (ref 11.5–15.5)
WBC: 7.5 10*3/uL (ref 4.0–10.5)

## 2016-07-09 LAB — CREATININE, SERUM
Creatinine, Ser: 0.84 mg/dL (ref 0.61–1.24)
GFR calc Af Amer: >60 mL/min (ref >60)

## 2016-07-09 MED ORDER — ONDANSETRON HCL 4 MG PO TABS: 4.0000 mg | ORAL_TABLET | Freq: Four times a day (QID) | ORAL | Status: DC | PRN

## 2016-07-09 MED ORDER — ONDANSETRON HCL 4 MG/2ML IJ SOLN: 4.0000 mg | Freq: Four times a day (QID) | INTRAMUSCULAR | Status: DC | PRN

## 2016-07-09 MED ORDER — ENOXAPARIN SODIUM 40 MG/0.4ML ~~LOC~~ SOLN: 40.0000 mg | SUBCUTANEOUS | Status: DC

## 2016-07-09 MED ORDER — SORBITOL 70 % SOLN: 30.0000 mL | Freq: Every day | Status: DC | PRN

## 2016-07-09 MED ORDER — PRAVASTATIN SODIUM 40 MG PO TABS
40.0000 mg | ORAL_TABLET | Freq: Every day | ORAL | Status: DC
  Administered 2016-07-09 – 2016-07-15 (×7): 40 mg via ORAL
  Filled 2016-07-09 (×7): qty 1

## 2016-07-09 MED ORDER — AMLODIPINE BESYLATE 10 MG PO TABS
10.0000 mg | ORAL_TABLET | Freq: Every day | ORAL | Status: DC
  Administered 2016-07-10 – 2016-07-16 (×7): 10 mg via ORAL
  Filled 2016-07-09 (×7): qty 1

## 2016-07-09 MED ORDER — ASPIRIN EC 325 MG PO TBEC
325.0000 mg | DELAYED_RELEASE_TABLET | Freq: Every day | ORAL | Status: DC
  Administered 2016-07-10 – 2016-07-16 (×7): 325 mg via ORAL
  Filled 2016-07-09 (×7): qty 1

## 2016-07-09 MED ORDER — ASPIRIN 325 MG PO TBEC: 325.0000 mg | DELAYED_RELEASE_TABLET | Freq: Every day | ORAL | 0 refills | Status: DC

## 2016-07-09 MED ORDER — SENNOSIDES-DOCUSATE SODIUM 8.6-50 MG PO TABS
1.0000 | ORAL_TABLET | Freq: Every evening | ORAL | Status: DC | PRN
  Administered 2016-07-12 – 2016-07-16 (×4): 1 via ORAL
  Filled 2016-07-09 (×4): qty 1

## 2016-07-09 MED ORDER — INFLUENZA VAC SPLIT QUAD 0.5 ML IM SUSY
0.5000 mL | PREFILLED_SYRINGE | INTRAMUSCULAR | Status: AC
  Administered 2016-07-10: 0.5 mL via INTRAMUSCULAR
  Filled 2016-07-09: qty 0.5

## 2016-07-09 MED ORDER — ACETAMINOPHEN 650 MG RE SUPP: 650.0000 mg | RECTAL | Status: DC | PRN

## 2016-07-09 MED ORDER — ACETAMINOPHEN 325 MG PO TABS: 650.0000 mg | ORAL_TABLET | ORAL | Status: DC | PRN

## 2016-07-09 MED ORDER — ACETAMINOPHEN 160 MG/5ML PO SOLN: 650.0000 mg | ORAL | Status: DC | PRN

## 2016-07-09 MED ORDER — ENOXAPARIN SODIUM 40 MG/0.4ML ~~LOC~~ SOLN
40.0000 mg | Freq: Every day | SUBCUTANEOUS | Status: DC
  Administered 2016-07-10 – 2016-07-16 (×7): 40 mg via SUBCUTANEOUS
  Filled 2016-07-09 (×7): qty 0.4

## 2016-07-09 NOTE — Discharge Planning (Signed)
Report given to Christiane Ha, RN and tele removed.  IV will remain for 4W to use as needed (per RN request).  Patient will be wheeled to Rehab. Family in room and aware. Transferring without pain.

## 2016-07-09 NOTE — Progress Notes (Signed)
Insurance has approved admit to CIR after on appeal and I can admit pt today. Pt and wife aware as well as RN CM , SW and Therapist, sports. I will contact Dr. Cruzita Lederer and make the arrangements to admit today. [id number]

## 2016-07-09 NOTE — Discharge Summary (Signed)
Physician Discharge Summary  Jack [name] [id number] DOB: 1934/05/23 DOA: 07/03/2016  PCP: Crisoforo Oxford, PA-C  Admit date: 07/03/2016 Discharge date: 07/09/2016  Admitted From: home Disposition:  CIR  Recommendations for Outpatient Follow-up:  Follow up with Dr. Leonie Man in 6-8 weeks Patient placed on full dose Aspirin on d/c    Discharge Condition: stable CODE STATUS: Full Diet recommendation: heart healthy  HPI: Per Dr. Sherrie George is a 82 y.o. male with medical history significant of HTN, HLD, CVA with residual left-sided weakness, atrial fibrillation, s/p MVR; presents with complaints of right-sided weakness. Patient notes that he got up this morning around 8 AM and was able to walk with the assistance of a cane to eat his breakfast. Following breakfast he noted that he had difficulty utilizing his right arm that he uses to hold his cane. He was also noted to have weakness in right lower extremity as well which was also negative. Patient denies any falls, nausea, vomiting, change in visual field, headache, shortness of breath, cough, chest pain, or palpitation. He takes 81 mg aspirin daily. Denies any history of GI bleed. Patient has had irregular heart beat for a few years now, but has never been on anticoagulation. Followed by Dr. Wynonia Lawman of cardiology. In reviewing cardiology records from Dr. Wynonia Lawman from 2015 it appears that patient was thought not to have been in A. fib, question holter monitor, and therefore was deemed not to be a candidate for anticoagulation at that time. It appears patient also was noted to have history of intercranial hemorrhage. ED Course:  CT scan of head showed general atrophy. Neurology was consulted and recommended transfer to [facility] for further workup.  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 Neurology was consulted and has followed patient while hospitalized. He has a history of CVA 2009 with left-sided residual weakness, and a history of [place] 2015. He underwent a 2D echo which showed EF 60-65%, LAE, dilated Rv, severe pulmonary HTN RVSP 153mmHG. Carotid dopplers with 1-39% ICA plaque. Vertebral artery flow is antegrade. Multiple discussions were held with neurology, cardiology and patient and per patient's wishes he does not wish anticoagulation and was placed on full dose aspirin.  Paroxysmal A Fib, currently in normal sinus - Chadsvascscore= 5. S/p Maze procedure as well as LAA was oversewn surgically in the past. Continue aspirin for now. Recommend anticoagulation with eliquis to prevent stroke, but does have hx of ICH in 2015. Dr. Maylene Roes discussed the risks and benefits of eliquis vs aspirin alone. Currently, he is refusing anticoagulation even if it means he could have another stroke. EP Dr. Rayann Heman consulted for consideration for left atrial appendage occlusion using watchman device, but given that he had LAA oversewn surgically, he is not a candidate for Watchman LAAO device. He recommended TEE however  patient is refusing. Essential hypertension - Amlodipine HLD - continue statin   Discharge Instructions   Allergies as of 07/09/2016      Reactions   Valsartan Swelling   angioedema      Medication List    TAKE these medications    acetaminophen 325 MG tablet Commonly known as:  TYLENOL Take 1-2 tablets (325-650 mg total) by mouth every 4 (four) hours as needed for mild pain.   amLODipine 10 MG tablet Commonly known as:  NORVASC Take 1 tablet (10 mg total) by mouth daily.   aspirin 325 MG EC tablet Take 1 tablet (325 mg total) by mouth daily. What changed:  medication strength  how much to take   hydrochlorothiazide 25 MG tablet Commonly known as:  HYDRODIURIL TAKE 1 TABLET BY MOUTH  DAILY   MAGNESIUM PO Take by mouth daily.   pravastatin 20 MG tablet Commonly known as:  PRAVACHOL Take 1 tablet (20 mg total) by mouth daily.   Vitamin D3 3000 units Tabs Take by mouth.      Follow-up Information    SETHI,PRAMOD, MD. Schedule an appointment as soon as possible for a visit in 2 month(s).   Specialties:  Neurology, Radiology Contact information: [address] [place] [address] [phone]          Allergies  Allergen Reactions  . Valsartan Swelling    angioedema    Consultations:  Cardiology   Neurology   Procedures/Studies:  2D echo  Carotid dopplers  Ct Head Wo Contrast  Result Date: 07/03/2016 CLINICAL DATA:  RIGHT-side weakness for several days worse since this morning, history hypertension, stroke, atrial fibrillation, PFO EXAM: CT HEAD WITHOUT CONTRAST TECHNIQUE: Contiguous axial images were obtained from the base of the skull through the vertex without intravenous contrast. COMPARISON:  08/17/2013 FINDINGS: Brain: New generalized atrophy. Normal ventricular morphology. No midline shift or mass effect. Small vessel chronic ischemic changes of deep cerebral white matter. Small old lacunar infarcts at the thalami. Question small old lacunar infarct at the RIGHT cerebellar hemisphere. Old RIGHT parieto-occipital infarct. No intracranial hemorrhage, mass lesion or evidence of acute infarction. No extra-axial fluid collections. Vascular: Scattered atherosclerotic  calcifications Skull: Intact Sinuses/Orbits: Cleared Other: N/A IMPRESSION: Atrophy with small vessel chronic ischemic changes of deep cerebral white matter. Multiple old infarcts as above. No acute intracranial abnormalities. Electronically Signed   By: Lavonia Dana M.D.   On: 07/03/2016 15:45   Mr Brain Wo Contrast  Result Date: 07/04/2016 CLINICAL DATA:  Right-sided weakness. EXAM: MRI HEAD WITHOUT CONTRAST MRA HEAD WITHOUT CONTRAST TECHNIQUE: Multiplanar, multiecho pulse sequences of the brain and surrounding structures were obtained without intravenous contrast. Angiographic images of the head were obtained using MRA technique without contrast. COMPARISON:  Head CT 07/03/2016.  Head MRI/ MRA 12/12/2007. FINDINGS: MRI HEAD FINDINGS The examination had to be discontinued prior to completion due to patient vomiting. Sagittal T1, axial and coronal diffusion, axial T2, and axial FLAIR sequences were obtained. The the FLAIR sequence is severely motion degraded and nondiagnostic. Sagittal T1 and axial T2 sequences are mildly motion degraded. Brain: There are small foci of acute cortical and subcortical infarction in the high left frontoparietal region extending inferiorly into the centrum semiovale. There are also 2 punctate foci of acute cortical infarction in the right occipital lobe. Bilateral cerebral white matter T2 hyperintensities have progressed from 2009 and are compatible with chronic small vessel ischemic disease. Chronic right frontoparietal and left parieto-occipital infarcts are new from 2009. There  is moderate cerebral atrophy. Small chronic infarcts are present in the thalami and left cerebellum, with the thalamic infarcts new from 2009. No gross intracranial hemorrhage, mass, midline shift, or extra-axial fluid collection is seen. Vascular: Grossly preserved flow voids. Skull and upper cervical spine: No destructive osseous lesion. Sinuses/Orbits: Grossly unremarkable orbits. No significant sinus  disease. Other: None. MRA HEAD FINDINGS The study is mildly motion degraded. The visualized distal vertebral arteries are patent to the basilar with the right being minimally larger than the left. There is focal narrowing versus flow artifact involving the distal left vertebral artery at the vertebrobasilar junction. PICAs and SCAs appear patent proximally with atherosclerotic narrowing versus artifact in the left SCA. The basilar artery is widely patent. There is a patent left posterior communicating artery. PCAs are patent without evidence of significant stenosis. The internal carotid arteries are widely patent from skullbase to carotid termini. ACAs and MCAs are patent without evidence of significant proximal stenosis. Detailed branch vessel evaluation is limited by motion artifact. No intracranial aneurysm is identified. IMPRESSION: 1. Incomplete, motion degraded examination as above. 2. Small acute cortical/subcortical infarcts in the high left frontoparietal region. 3. Punctate acute right occipital lobe cortical infarcts. 4. Chronic cerebral and cerebellar infarcts as above. 5. No evidence of major intracranial arterial occlusion or significant proximal stenosis. Electronically Signed   By: Logan Bores M.D.   On: 07/04/2016 11:58   [name] Head/brain [id number] Cm  Result Date: 07/04/2016 CLINICAL DATA:  Right-sided weakness. EXAM: MRI HEAD WITHOUT CONTRAST MRA HEAD WITHOUT CONTRAST TECHNIQUE: Multiplanar, multiecho pulse sequences of the brain and surrounding structures were obtained without intravenous contrast. Angiographic images of the head were obtained using MRA technique without contrast. COMPARISON:  Head CT 07/03/2016.  Head MRI/ MRA 12/12/2007. FINDINGS: MRI HEAD FINDINGS The examination had to be discontinued prior to completion due to patient vomiting. Sagittal T1, axial and coronal diffusion, axial T2, and axial FLAIR sequences were obtained. The the FLAIR sequence is severely motion degraded and  nondiagnostic. Sagittal T1 and axial T2 sequences are mildly motion degraded. Brain: There are small foci of acute cortical and subcortical infarction in the high left frontoparietal region extending inferiorly into the centrum semiovale. There are also 2 punctate foci of acute cortical infarction in the right occipital lobe. Bilateral cerebral white matter T2 hyperintensities have progressed from 2009 and are compatible with chronic small vessel ischemic disease. Chronic right frontoparietal and left parieto-occipital infarcts are new from 2009. There is moderate cerebral atrophy. Small chronic infarcts are present in the thalami and left cerebellum, with the thalamic infarcts new from 2009. No gross intracranial hemorrhage, mass, midline shift, or extra-axial fluid collection is seen. Vascular: Grossly preserved flow voids. Skull and upper cervical spine: No destructive osseous lesion. Sinuses/Orbits: Grossly unremarkable orbits. No significant sinus disease. Other: None. MRA HEAD FINDINGS The study is mildly motion degraded. The visualized distal vertebral arteries are patent to the basilar with the right being minimally larger than the left. There is focal narrowing versus flow artifact involving the distal left vertebral artery at the vertebrobasilar junction. PICAs and SCAs appear patent proximally with atherosclerotic narrowing versus artifact in the left SCA. The basilar artery is widely patent. There is a patent left posterior communicating artery. PCAs are patent without evidence of significant stenosis. The internal carotid arteries are widely patent from skullbase to carotid termini. ACAs and MCAs are patent without evidence of significant proximal stenosis. Detailed branch vessel evaluation is limited by motion artifact. No intracranial aneurysm  is identified. IMPRESSION: 1. Incomplete, motion degraded examination as above. 2. Small acute cortical/subcortical infarcts in the high left frontoparietal  region. 3. Punctate acute right occipital lobe cortical infarcts. 4. Chronic cerebral and cerebellar infarcts as above. 5. No evidence of major intracranial arterial occlusion or significant proximal stenosis. Electronically Signed   By: Logan Bores M.D.   On: 07/04/2016 11:58      Subjective: - no chest pain, shortness of breath, no abdominal pain, nausea or vomiting.   Discharge Exam: Vitals:   07/09/16 0126 07/09/16 0522  BP: 133/70 126/60  Pulse: 89 63  Resp: 20 20  Temp: 97.1 F (36.2 C) 98 F (36.7 C)   Vitals:   07/08/16 1831 07/08/16 2058 07/09/16 0126 07/09/16 0522  BP: 135/63 139/73 133/70 126/60  Pulse: 85 88 89 63  Resp: 18 20 20 20   Temp: 98.3 F (36.8 C) 97.4 F (36.3 C) 97.1 F (36.2 C) 98 F (36.7 C)  TempSrc: Oral Oral Oral Oral  SpO2: 98% 99% 99% 97%  Weight:      Height:        General: Pt is alert, awake, not in acute distress Extremities: no edema, no cyanosis    The results of significant diagnostics from this hospitalization (including imaging, microbiology, ancillary and laboratory) are listed below for reference.     Microbiology: No results found for this or any previous visit (from the past 240 hour(s)).   Labs: BNP (last 3 results) No results for input(s): BNP in the last 8760 hours. Basic Metabolic Panel:  Recent Labs Lab 07/04/16 0220 07/05/16 1141 07/06/16 0525 07/07/16 0519 07/08/16 0714 07/09/16 0655  NA 139 135 137 138 138 137  K 3.4* 3.5 3.4* 3.5 3.7 3.7  CL 106 102 103 104 104 103  CO2 26 27 26 28 27 27   GLUCOSE 83 115* 97 85 93 95  BUN 10 10 11 13 15 14   CREATININE 0.78 0.82 0.86 0.77 0.81 0.86  CALCIUM 8.7* 8.8* 9.2 9.3 8.9 8.7*  MG 1.9  --   --   --   --   --    Liver Function Tests: No results for input(s): AST, ALT, ALKPHOS, BILITOT, PROT, ALBUMIN in the last 168 hours. No results for input(s): LIPASE, AMYLASE in the last 168 hours. No results for input(s): AMMONIA in the last 168 hours. CBC:  Recent  Labs Lab 07/03/16 1400 07/04/16 0220  WBC 8.0 9.0  NEUTROABS 5.8  --   HGB 12.9* 12.1*  HCT 38.9* 37.0*  MCV 89.2 88.7  PLT 295 278   Cardiac Enzymes: No results for input(s): CKTOTAL, CKMB, CKMBINDEX, TROPONINI in the last 168 hours. BNP: Invalid input(s): POCBNP CBG: No results for input(s): GLUCAP in the last 168 hours. D-Dimer No results for input(s): DDIMER in the last 72 hours. Hgb A1c No results for input(s): HGBA1C in the last 72 hours. Lipid Profile No results for input(s): CHOL, HDL, LDLCALC, TRIG, CHOLHDL, LDLDIRECT in the last 72 hours. Thyroid function studies No results for input(s): TSH, T4TOTAL, T3FREE, THYROIDAB in the last 72 hours.  Invalid input(s): FREET3 Anemia work up No results for input(s): VITAMINB12, FOLATE, FERRITIN, TIBC, IRON, RETICCTPCT in the last 72 hours. Urinalysis    Component Value Date/Time   COLORURINE YELLOW 07/03/2016 1400   APPEARANCEUR CLEAR 07/03/2016 1400   LABSPEC 1.006 07/03/2016 1400   PHURINE 8.0 07/03/2016 1400   GLUCOSEU NEGATIVE 07/03/2016 1400   HGBUR NEGATIVE 07/03/2016 1400   BILIRUBINUR NEGATIVE 07/03/2016  [place] 04/02/2015 1500   KETONESUR NEGATIVE 07/03/2016 1400   PROTEINUR NEGATIVE 07/03/2016 1400   UROBILINOGEN negative 04/02/2015 1500   UROBILINOGEN 0.2 08/02/2013 1206   NITRITE NEGATIVE 07/03/2016 1400   LEUKOCYTESUR NEGATIVE 07/03/2016 1400   Sepsis Labs Invalid input(s): PROCALCITONIN,  WBC,  LACTICIDVEN Microbiology No results found for this or any previous visit (from the past 240 hour(s)).   Time coordinating discharge: Over 30 minutes  SIGNED:  Marzetta Board, MD  [facility] 07/09/2016, 8:58 AM Pager [phone]  If 7PM-7AM, please contact night-coverage [email or web address] Password [email or web address]

## 2016-07-09 NOTE — Discharge Planning (Addendum)
Discharge papers given, explained and educated. Discussed possible future s/sx of stroke and to call 911 when symptoms are noted. Emphasized that "time equal brain loss." Patient and family voiced understanding. Informed of suggested FU appt and appt made March 1,2018 at 1500 (arrive at 1430).  Script sent to [facility] at [place], [place].  RN assessment and VS revealed stability to DC to Inpatient  Rehab (4W). Once bed and patient are ready, will be wheeled to room 4W-09.  Awaiting call for report.

## 2016-07-09 NOTE — Progress Notes (Signed)
Jack Gong, RN Rehab Admission Coordinator Signed Physical Medicine and Rehabilitation  PMR Pre-admission Date of Service: 07/08/2016 2:59 PM  Related encounter: ED to Hosp-Admission (Discharged) from 07/03/2016 in [place]       [] Hide copied text PMR Admission Coordinator Pre-Admission Assessment  Patient: Jack [name] is an 82 y.o., male MRN: [id number] DOB: [birth year] Height: 5\' 4"  (162.6 cm) Weight: 66.9 kg (147 lb 7.8 oz)                                                                                                                                                  Insurance Information HMO:   PPO: yes     PCP:      IPA:      80/20:      OTHER: medicare advantage plan PRIMARY: [place]      Policy#: [id number]      Subscriber: pt CM Name: Jack [name]      Phone#: [phone]     Fax#: [phone] Pre-Cert#: [id number] denied on 12/27 expedited appeal with [facility] at phone [phone] fax [phone] auth # [id number] f/u CM is Sharlett Iles at phone [phone] fax: EPIC access   Employer: retired Benefits:  Phone #: [phone]     Name: 07/07/2016 Eff. Date: 02/09/14     Deduct: $200      Out of Pocket Max: $2200      Life Max: none CIR: no co pay covers 100%      SNF: no co pay days  Up to 100 days Outpatient: no co pay      Co-Pay: visits per medical necessity Home Health: 100%      Co-Pay: visits per medical necessity DME: no co pay      Co-Pay:  Providers: in network  SECONDARY: none      Medicaid Application Date:       Case Manager:  Disability Application Date:       Case Worker:   Emergency Tax adviser Information    Name Relation Home Work Mobile   Jack [name]       Current Medical History  Patient Admitting Diagnosis: acute cortical/subcortical infarcts in left frontoparietal region, acute right occipital lobe infarcts  History of Present Illness:           : HPI: Jack [name] a 82 y.o.LH-male with history of A fib maintained on aspirin 81 mg daily, PFO, hemorrhagic CVA with spastic left hemiparesis2 years ago, memory deficits and gait disorder, HTN . Presentedon 07/03/16 with acute onset of right sided weakness. MRI/MRA brain done revealing small acute cortical/subcortical infarcts in left frontoparietal region, acute right occipital lobe infarcts and chronic cerebral and cerebellar infarcts.Patient did not receive TPA.Carotid dopplers without significant ICA stenosis. BLE  dopplers negative for DVT. 2D echo with EF 60-65% with severely dilated LA, moderate TVR and mild LVH.Patient had refused TEE. Neurology consulted presently maintained on aspirin 325 mg daily as well as subcutaneous Lovenox for DVT prophylaxis. There was consideration of Eliquissecondary to cardioembolic stroke however patient had refused thus patient remained with aspirin therapy.   Total: 3 NIHSS  Past Medical History      Past Medical History:  Diagnosis Date  . Atrial fibrillation ([place])    Dr. Tollie Eth  . BPH (benign prostatic hypertrophy) 2012   prior therapy with finasteride, but as of 03/2015 no c/o; Dr. Luanne Bras  . Cellulitis of face 11/08   [facility]  . CVA (cerebral infarction) 2009, 2015  . Echocardiogram abnormal 02/11/2010   LVEF 70%, severe mitral regurgitation, pulm HTN   . ED (erectile dysfunction)    Viagra prn, consult with Urology 2012  . Elevated PSA 2012   BPH; Dr. Luanne Bras, [facility]; no more PSAs needed as of 2012 consult  . Gait abnormality    uses cane, s/p CVA  . Hemiparesis affecting left side as late effect of cerebrovascular accident ([place])   . Hiatal hernia    per 2008 EUS  . Hyperlipidemia   . Hypertension   . Microscopic hematuria    chronic, eval 2012 with Urology, benign at that time  . Obstructive uropathy 2012   Dr. Luanne Bras  . Pancreatic mass  01/2006   inconsequential per CT 2007  . 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 Patent foramen ovale    hx/o, notation of fistula on echocardiogram from 2009  . Prophylactic antibiotic 2016   requires due to valve disease  . Schatzki's ring 2008   per endoscopic ultrasound; Dr. Janetta Hora. Jack [name]  . Stroke ([place])   . Ventral hernia    surgery consult 01/2010 with Dr. Excell Seltzer, elective repair if desired , but he declined  . Wears hearing aid     Family History  Family history is unknown by patient.  Prior Rehab/Hospitalizations:  Has the patient had major surgery during 100 days prior to admission? No  CIR 2015   Current Medications   Current Facility-Administered Medications:  .  0.9 %  sodium chloride infusion, , Intravenous, Continuous, Renee Dyane Dustman, PA-C, Stopped at 07/07/16 1607 .  acetaminophen (TYLENOL) tablet 650 mg, 650 mg, Oral, Q4H PRN **OR** acetaminophen (TYLENOL) solution 650 mg, 650 mg, Per Tube, Q4H PRN **OR** acetaminophen (TYLENOL) suppository 650 mg, 650 mg, Rectal, Q4H PRN, Norval Morton, MD .  amLODipine (NORVASC) tablet 10 mg, 10 mg, Oral, Daily, Norval Morton, MD, 10 mg at 07/08/16 0911 .  aspirin EC tablet 325 mg, 325 mg, Oral, Once, Alexandra M Law, PA-C .  aspirin EC tablet 325 mg, 325 mg, Oral, Daily, Norval Morton, MD, 325 mg at 07/08/16 0911 .  diphenhydrAMINE (BENADRYL) capsule 25 mg, 25 mg, Oral, Once PRN, Wallie Char .  enoxaparin (LOVENOX) injection 40 mg, 40 mg, Subcutaneous, Daily, Norval Morton, MD, 40 mg at 07/08/16 0911 .  pravastatin (PRAVACHOL) tablet 40 mg, 40 mg, Oral, q1800, David L Rinehuls, PA-C, 40 mg at 07/07/16 1742 .  senna-docusate (Senokot-S) tablet 1 tablet, 1 tablet, Oral, QHS PRN, Norval Morton, MD  Patients Current Diet: Diet Heart Room service appropriate? Yes; Fluid consistency: Thin  Precautions / Restrictions Precautions Precautions:  Fall Restrictions Weight Bearing Restrictions: No   Has the  patient had 2 or more falls or a fall with injury in the past year?No  Prior Activity Level Pt is Mod I with a straight cane in the community and usually no device in the home. Does not drive. Goes to silver sneakers gym 6 days per week.  Home Assistive Devices / Equipment Home Assistive Devices/Equipment: Wellsite geologist, Hearing aid Home Equipment: Cane - quad, Cane - single point, Environmental consultant - 2 wheels  Prior Device Use: Indicate devices/aids used by the patient prior to current illness, exacerbation or injury? cane  Prior Functional Level Prior Function Level of Independence: Independent with assistive device(s) Comments: independent without A.D. in house and with [facility] in community. Goes to the gym with his wife 2 x week to walk. Community ambulator.  Self Care: Did the patient need help bathing, dressing, using the toilet or eating?  Independent  Indoor Mobility: Did the patient need assistance with walking from room to room (with or without device)? Independent  Stairs: Did the patient need assistance with internal or external stairs (with or without device)? Independent  Functional Cognition: Did the patient need help planning regular tasks such as shopping or remembering to take medications? Needed some help  Current Functional Level Cognition Arousal/Alertness: Awake/alert Overall Cognitive Status: History of cognitive impairments - at baseline Orientation Level: Oriented X4 General Comments: Pt recalling events of last OT session yesterday. Attention: Focused Focused Attention: Appears intact Memory: Impaired Memory Impairment: Retrieval deficit Awareness: Impaired Awareness Impairment: Emergent impairment    Extremity Assessment (includes Sensation/Coordination) Upper Extremity Assessment: RUE deficits/detail, LUE deficits/detail RUE Deficits / Details: @ 4/5 throughout. Appears to have ataxic movements    RUE Coordination: decreased fine motor LUE Deficits / Details: at baseline. ataxia. sensory deficits. States it feels "numb" LUE Sensation: decreased light touch, decreased proprioception  Lower Extremity Assessment: Defer to PT evaluation RLE Deficits / Details: grossly 4/5 LLE Deficits / Details: grossly 4-/5 LLE Sensation: decreased proprioception LLE Coordination: decreased fine motor   ADLs Overall ADL's : Needs assistance/impaired Eating/Feeding: Minimal assistance, Sitting Eating/Feeding Details (indicate cue type and reason): assist to open containers, pt placed foam build up on fork independently Grooming: Wash/dry hands, Standing, Min guard Upper Body Bathing: Minimal assistance, Sitting Upper Body Bathing Details (indicate cue type and reason): Pt unable to maintain static sitting balance when engaged in functional task using BUE. Falls to L Lower Body Bathing: Moderate assistance, Sit to/from stand Upper Body Dressing : Moderate assistance, Sitting Lower Body Dressing: Min guard, Sitting/lateral leans Lower Body Dressing Details (indicate cue type and reason): donned socks with assist to balance at EOB, tends to lean L Toilet Transfer: Minimal assistance, Ambulation, RW, BSC (over toilet) Toileting- Clothing Manipulation and Hygiene: Moderate assistance, Sit to/from stand Functional mobility during ADLs: Minimal assistance, Rolling walker, Cueing for safety, Cueing for sequencing   Mobility Overal bed mobility: Needs Assistance Bed Mobility: Supine to Sit Supine to sit: Supervision, HOB elevated Sit to supine: Min guard, HOB elevated General bed mobility comments: increased time, no physical assist   Transfers Overall transfer level: Needs assistance Equipment used: Rolling walker (2 wheeled) Transfers: Sit to/from Stand Sit to Stand: Mod assist Stand pivot transfers: Min assist General transfer comment: verbal cues for hand placement, steadying assist; small, but  persistent posterior lean, requiring light mod assist to stay on his feet   Ambulation / Gait / Stairs / Wheelchair Mobility Ambulation/Gait Ambulation/Gait assistance: Museum/gallery curator (Feet): 100 Feet Assistive device: Rolling walker (2 wheeled) Gait Pattern/deviations: Decreased stride  length, Trunk flexed, Narrow base of support General Gait Details: verbal and tactile cues for posture, left lateral lean; focused on incr L stance stability with cues and tactile facilitation to activate L knee and hip extensors, and extend hip and knee in L stance; better R step length with facilitation as well Gait velocity: decreased Gait velocity interpretation: Below normal speed for age/gender   Posture / Balance Dynamic Sitting Balance Sitting balance - Comments: Leans L with donning socks Balance Overall balance assessment: Needs assistance Sitting-balance support: Feet supported Sitting balance-Leahy Scale: Poor Sitting balance - Comments: Leans L with donning socks Postural control: Posterior lean, Left lateral lean Standing balance support: Single extremity supported Standing balance-Leahy Scale: Poor   Special needs/care consideration BiPAP/CPAP  N/a CPM  N/a Continuous Drip IV  N/a Dialysis  N/a Life Vest  N/a Oxygen  N/a Special Bed  N/a Trach Size  N/a Wound Vac (area)  N/a Skin intact                           Bowel mgmt: continent Bladder mgmt: continent Diabetic mgmt  N/a   Previous Home Environment Living Arrangements: Spouse/significant other  Lives With: Spouse Available Help at Discharge: Family Type of Home: House Home Layout: Multi-level, Bed/bath upstairs, 1/2 bath on main level Alternate Level Stairs-Rails: Right Alternate Level Stairs-Number of Steps: 2 sets of 7 steps  Home Access: Level entry Bathroom Shower/Tub: Tub/shower unit, Door, Multimedia programmer: Standard Bathroom Accessibility: Yes How Accessible: Accessible via  walker Home Care Services: No  Discharge Living Setting Plans for Discharge Living Setting: Patient's home, Lives with (comment) (wife) Type of Home at Discharge: House Discharge Home Layout: Multi-level, Bed/bath upstairs, 1/2 bath on main level Alternate Level Stairs-Rails: Right Alternate Level Stairs-Number of Steps: 2 sets of 7 steps Discharge Home Access: Level entry Discharge Bathroom Shower/Tub: Tub/shower unit, Walk-in shower Discharge Bathroom Toilet: Standard Discharge Bathroom Accessibility: Yes How Accessible: Accessible via walker Does the patient have any problems obtaining your medications?: No  Social/Family/Support Systems Patient Roles: Spouse Contact Information: Equities trader, wife Anticipated Caregiver: wife Anticipated Ambulance person Information: see above Ability/Limitations of Caregiver: no limitations Caregiver Availability: 24/7 Discharge Plan Discussed with Primary Caregiver: Yes Is Caregiver In Agreement with Plan?: Yes Does Caregiver/Family have Issues with Lodging/Transportation while Pt is in Rehab?: No  Goals/Additional Needs Patient/Family Goal for Rehab: supervision to Mod I with PT and OT Expected length of stay: ELOS 12-16 days Pt/Family Agrees to Admission and willing to participate: Yes Program Orientation Provided & Reviewed with Pt/Caregiver Including Roles  & Responsibilities: Yes  Decrease burden of Care through IP rehab admission: n/a  Possible need for SNF placement upon discharge: not anticipated  Patient Condition: This patient's medical and functional status has changed since the consult dated: 07/06/2016 in which the Rehabilitation Physician determined and documented that the patient's condition is appropriate for intensive rehabilitative care in an inpatient rehabilitation facility. See "History of Present Illness" (above) for medical update. Functional changes are: overall min to mod assist. Patient's medical and functional  status update has been discussed with the Rehabilitation physician and patient remains appropriate for inpatient rehabilitation. Will admit to inpatient rehab today.  Preadmission Screen Completed By:  Cleatrice Burke, 07/08/2016 2:59 PM ______________________________________________________________________   Discussed status with Dr. Naaman Plummer on 07/09/2016 at  1014 and received telephone approval for admission today.  Admission Coordinator:  Cleatrice Burke, time [id number] Date 07/09/2016  Cosigned by: Meredith Staggers, MD at 07/09/2016 10:47 AM  Revision History

## 2016-07-09 NOTE — Care Management Note (Signed)
Case Management Note  Patient Details  Name: Jack [name] MRN: [id number] Date of Birth: 1934/01/01  Subjective/Objective:                    Action/Plan: Pt discharging to CIR today. No further needs per CM.   Expected Discharge Date:                  Expected Discharge Plan:  [place]  In-House Referral:     Discharge planning Services     Post Acute Care Choice:    Choice offered to:     DME Arranged:    DME Agency:     HH Arranged:    [place] Agency:     Status of Service:  Completed, signed off  If discussed at [employer] of Stay Meetings, dates discussed:    Additional Comments:  Pollie Friar, RN 07/09/2016, 11:27 AM

## 2016-07-09 NOTE — Progress Notes (Signed)
Ankit Lorie Phenix, MD Physician Signed Physical Medicine and Rehabilitation  Consult Note Date of Service: 07/06/2016 10:47 AM  Related encounter: ED to Hosp-Admission (Discharged) from 07/03/2016 in [facility] All Collapse All   [] Hide copied text [] Hover for attribution information      Physical Medicine and Rehabilitation Consult   Reason for Consult: Right sided weakness superimposed on left hemiparesis Referring Physician: Dr. Maylene Roes   HPI: Jack [name] is a 82 y.o. LH-male with history of A fib, PFO, hemorrhagic CVA with spastic left hemiparesis, memory deficits and gait disorder, HTN, who was admitted on 07/03/16 with acute onset of right sided weakness. MRI/MRA brain done revealing small acute cortical/subcortical infarcts in left frontoparietal region, acute right occipital lobe infarcts and chronic cerebral and cerebellar infarcts. Carotid dopplers without significant ICA stenosis. BLE dopplers negative for DVT. 2D echo with EF 60-65% with severely dilated LA, moderate TVR and mild LVH. Dr. Leonie Man evaluated patient (who has been hesitant to start anticoagulation despite multiple strokes) and recommended Eliquis for cardio embolic stroke.  Patient reports that he is not interested in anticoagulation at this time and would like to discuss this with cardiology prior to starting medication. ST evaluation with baseline STM and problem solving deficits--no follow up needed. PT/OT evaluation done showing flexed posture with narrow BOS, LLE instability, left lean and inability to carry out ADL tasks. CIR recommended by MD and rehab team.   He was independent without AD in the home--uses cane out of home. Goes to the gym 4 days a week--does bike, treadmill and few weights for total of 60 minutes.    Review of Systems  HENT: Positive for hearing loss. Negative for ear pain and tinnitus.   Eyes: Positive for blurred vision (poor  vision). Negative for double vision.  Respiratory: Positive for wheezing. Negative for cough and shortness of breath.   Cardiovascular: Negative for chest pain and palpitations.  Gastrointestinal: Positive for constipation and heartburn (pretty bad).  Genitourinary: Positive for frequency.  Musculoskeletal: Negative for back pain, joint pain, myalgias and neck pain.  Skin: Negative for itching and rash.  Neurological: Positive for sensory change (left sided numbness) and focal weakness. Negative for dizziness and headaches.  Psychiatric/Behavioral: The patient has insomnia (due to LLE spasticity).   All other systems reviewed and are negative.       Past Medical History:  Diagnosis Date  . Atrial fibrillation ([place])    Dr. Tollie Eth  . BPH (benign prostatic hypertrophy) 2012   prior therapy with finasteride, but as of 03/2015 no c/o; Dr. Luanne Bras  . Cellulitis of face 11/08   [facility]  . CVA (cerebral infarction) 2009, 2015  . Echocardiogram abnormal 02/11/2010   LVEF 70%, severe mitral regurgitation, pulm HTN   . ED (erectile dysfunction)    Viagra prn, consult with Urology 2012  . Elevated PSA 2012   BPH; Dr. Luanne Bras, [facility]; no more PSAs needed as of 2012 consult  . Gait abnormality    uses cane, s/p CVA  . Hemiparesis affecting left side as late effect of cerebrovascular accident ([place])   . Hiatal hernia    per 2008 EUS  . Hyperlipidemia   . Hypertension   . Microscopic hematuria    chronic, eval 2012 with Urology, benign at that time  . Obstructive uropathy 2012   Dr. Luanne Bras  . Pancreatic mass 01/2006   inconsequential per CT 2007  .  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 Patent foramen ovale    hx/o, notation of fistula on echocardiogram from 2009  . Prophylactic antibiotic 2016   requires due to valve disease  . Schatzki's ring 2008   per endoscopic ultrasound;  Dr. Janetta Hora. [name]  . Stroke ([place])   . Ventral hernia    surgery consult 01/2010 with Dr. Excell Seltzer, elective repair if desired , but he declined  . Wears hearing aid          Past Surgical History:  Procedure Laterality Date  . CARDIAC CATHETERIZATION  02/2010   LVEF 70%, severe mitral regurgitation, pulm HTN; prior in 01/2006  . INGUINAL HERNIA REPAIR     bilat  . MITRAL VALVE REPAIR  01/2006   mitral repair with Maze procedure, tricuspid annuloplasty, Dr. Roxy Manns         Family History  Problem Relation Age of Onset  . Family history unknown: Yes    Social History:   Married--wife at home and can provide assistance after discharge.  Retired from [employer]. He reports that he has never smoked. He has never used smokeless tobacco. He reports that he does not drink alcohol or use drugs.     Allergies  Allergen Reactions  . Valsartan Swelling    angioedema         Medications Prior to Admission  Medication Sig Dispense Refill  . amLODipine (NORVASC) 10 MG tablet Take 1 tablet (10 mg total) by mouth daily. 90 tablet 3  . aspirin EC 81 MG EC tablet Take 1 tablet (81 mg total) by mouth daily. 90 tablet 1  . Cholecalciferol (VITAMIN D3) 3000 UNITS TABS Take by mouth.    Marland Kitchen MAGNESIUM PO Take by mouth daily.    Marland Kitchen acetaminophen (TYLENOL) 325 MG tablet Take 1-2 tablets (325-650 mg total) by mouth every 4 (four) hours as needed for mild pain. (Patient not taking: Reported on 07/03/2016)    . hydrochlorothiazide (HYDRODIURIL) 25 MG tablet TAKE 1 TABLET BY MOUTH  DAILY 90 tablet 0  . pravastatin (PRAVACHOL) 20 MG tablet Take 1 tablet (20 mg total) by mouth daily. 90 tablet 1    Home: Home Living Family/patient expects to be discharged to:: Private residence Living Arrangements: Spouse/significant other Available Help at Discharge: Family Type of Home: House Home Access: Level entry Home Layout: Multi-level, Bed/bath upstairs,  1/2 bath on main level Alternate Level Stairs-Number of Steps: 2 sets of 7 steps  Alternate Level Stairs-Rails: Right Bathroom Shower/Tub: Tub/shower unit, Door, Multimedia programmer: Standard Bathroom Accessibility: Yes Home Equipment: Kasandra Knudsen - quad, Cane - single point, Environmental consultant - 2 wheels  Lives With: Spouse  Functional History: Prior Function Level of Independence: Independent with assistive device(s) Comments: independent without A.D. in house and with [facility] in community. Goes to the gym with his wife 2 x week to walk. Community ambulator. Functional Status:  Mobility: Bed Mobility Overal bed mobility: Needs Assistance Bed Mobility: Supine to Sit Supine to sit: Supervision, HOB elevated Sit to supine: Min guard, HOB elevated General bed mobility comments: +rail, increased time to complete Transfers Overall transfer level: Needs assistance Equipment used: Rolling walker (2 wheeled) Transfers: Sit to/from Stand Sit to Stand: Min assist Stand pivot transfers: Min assist General transfer comment: verbal cues for hand placement, assist to power up; Cues to push feet into floor for greater stance stability Ambulation/Gait Ambulation/Gait assistance: Min assist Ambulation Distance (Feet): 85 Feet Assistive device:  Rolling walker (2 wheeled) Gait Pattern/deviations: Decreased stride length, Trunk flexed, Narrow base of support General Gait Details: verbal and tactile cues for posture, left lateral lean; focused on incr L stance stability with cues and tactile facilitation to activate L knee and hip extensors, and extend hip and knee in L stance; better R step length with facilitation as well Gait velocity: decreased Gait velocity interpretation: Below normal speed for age/gender    ADL: ADL Overall ADL's : Needs assistance/impaired Eating/Feeding: Set up Eating/Feeding Details (indicate cue type and reason): issued red tubing to help with holding utnesil Grooming: Set up,  Supervision/safety, Sitting Upper Body Bathing: Minimal assistance, Sitting Upper Body Bathing Details (indicate cue type and reason): Pt unable to maintain static sitting balance when engaged in functional task using BUE. Falls to L Lower Body Bathing: Moderate assistance, Sit to/from stand Upper Body Dressing : Moderate assistance, Sitting Lower Body Dressing: Moderate assistance, Sit to/from stand Toilet Transfer: Minimal assistance, Stand-pivot Toileting- Clothing Manipulation and Hygiene: Moderate assistance, Sit to/from stand Functional mobility during ADLs: Minimal assistance (stand pivot only today)  Cognition: Cognition Overall Cognitive Status: History of cognitive impairments - at baseline Arousal/Alertness: Awake/alert Orientation Level: Oriented X4 Attention: Focused Focused Attention: Appears intact Memory: Impaired Memory Impairment: Retrieval deficit Awareness: Impaired Awareness Impairment: Emergent impairment Cognition Arousal/Alertness: Awake/alert Behavior During Therapy: WFL for tasks assessed/performed Overall Cognitive Status: History of cognitive impairments - at baseline General Comments: labile   Blood pressure 123/65, pulse 87, temperature 98.3 F (36.8 C), temperature source Oral, resp. rate 18, weight 66.9 kg (147 lb 8 oz), SpO2 97 %. Physical Exam  Nursing note and vitals reviewed. Constitutional: He is oriented to person, place, and time. He appears well-developed and well-nourished.  HENT:  Head: Normocephalic and atraumatic.  Mouth/Throat: Oropharynx is clear and moist.  Eyes: Conjunctivae and EOM are normal. Pupils are equal, round, and reactive to light.  Neck: Normal range of motion. Neck supple. No tracheal deviation present. No thyromegaly present.  Cardiovascular: Normal rate and regular rhythm.   Respiratory: Effort normal. He has wheezes.  GI: Soft. Bowel sounds are normal. He exhibits no distension. There is no tenderness.    Musculoskeletal: He exhibits no edema or tenderness.  Neurological: He is alert and oriented to person, place, and time.  HOH.  Soft, slow speech.  Able to follow basic motor commands.  Sensation diminished to light touch LUE/LLE.  Motor: RUE/RLE: 4/5 proximal to distal LUE/LLE: 4-/5 proximal to distal Ataxia R>L DTRs 3+ LUE/LLE  Skin: Skin is warm and dry.  Psychiatric: His mood appears anxious. His speech is delayed. He is slowed.    Lab Results Last 24 Hours       Results for orders placed or performed during the hospital encounter of 07/03/16 (from the past 24 hour(s))  Basic metabolic panel     Status: Abnormal   Collection Time: 07/05/16 11:41 AM  Result Value Ref Range   Sodium 135 135 - 145 mmol/L   Potassium 3.5 3.5 - 5.1 mmol/L   Chloride 102 101 - 111 mmol/L   CO2 27 22 - 32 mmol/L   Glucose, Bld 115 (H) 65 - 99 mg/dL   BUN 10 6 - 20 mg/dL   Creatinine, Ser 0.82 0.61 - 1.24 mg/dL   Calcium 8.8 (L) 8.9 - 10.3 mg/dL   GFR calc non Af Amer >60 >60 mL/min   GFR calc Af Amer >60 >60 mL/min   Anion gap 6 5 - 15  Basic metabolic  panel     Status: Abnormal   Collection Time: 07/06/16  5:25 AM  Result Value Ref Range   Sodium 137 135 - 145 mmol/L   Potassium 3.4 (L) 3.5 - 5.1 mmol/L   Chloride 103 101 - 111 mmol/L   CO2 26 22 - 32 mmol/L   Glucose, Bld 97 65 - 99 mg/dL   BUN 11 6 - 20 mg/dL   Creatinine, Ser 0.86 0.61 - 1.24 mg/dL   Calcium 9.2 8.9 - 10.3 mg/dL   GFR calc non Af Amer >60 >60 mL/min   GFR calc Af Amer >60 >60 mL/min   Anion gap 8 5 - 15      Imaging Results (Last 48 hours)  Mr Brain Wo Contrast  Result Date: 07/04/2016 CLINICAL DATA:  Right-sided weakness. EXAM: MRI HEAD WITHOUT CONTRAST MRA HEAD WITHOUT CONTRAST TECHNIQUE: Multiplanar, multiecho pulse sequences of the brain and surrounding structures were obtained without intravenous contrast. Angiographic images of the head were obtained using MRA technique without  contrast. COMPARISON:  Head CT 07/03/2016.  Head MRI/ MRA 12/12/2007. FINDINGS: MRI HEAD FINDINGS The examination had to be discontinued prior to completion due to patient vomiting. Sagittal T1, axial and coronal diffusion, axial T2, and axial FLAIR sequences were obtained. The the FLAIR sequence is severely motion degraded and nondiagnostic. Sagittal T1 and axial T2 sequences are mildly motion degraded. Brain: There are small foci of acute cortical and subcortical infarction in the high left frontoparietal region extending inferiorly into the centrum semiovale. There are also 2 punctate foci of acute cortical infarction in the right occipital lobe. Bilateral cerebral white matter T2 hyperintensities have progressed from 2009 and are compatible with chronic small vessel ischemic disease. Chronic right frontoparietal and left parieto-occipital infarcts are new from 2009. There is moderate cerebral atrophy. Small chronic infarcts are present in the thalami and left cerebellum, with the thalamic infarcts new from 2009. No gross intracranial hemorrhage, mass, midline shift, or extra-axial fluid collection is seen. Vascular: Grossly preserved flow voids. Skull and upper cervical spine: No destructive osseous lesion. Sinuses/Orbits: Grossly unremarkable orbits. No significant sinus disease. Other: None. MRA HEAD FINDINGS The study is mildly motion degraded. The visualized distal vertebral arteries are patent to the basilar with the right being minimally larger than the left. There is focal narrowing versus flow artifact involving the distal left vertebral artery at the vertebrobasilar junction. PICAs and SCAs appear patent proximally with atherosclerotic narrowing versus artifact in the left SCA. The basilar artery is widely patent. There is a patent left posterior communicating artery. PCAs are patent without evidence of significant stenosis. The internal carotid arteries are widely patent from skullbase to carotid  termini. ACAs and MCAs are patent without evidence of significant proximal stenosis. Detailed branch vessel evaluation is limited by motion artifact. No intracranial aneurysm is identified. IMPRESSION: 1. Incomplete, motion degraded examination as above. 2. Small acute cortical/subcortical infarcts in the high left frontoparietal region. 3. Punctate acute right occipital lobe cortical infarcts. 4. Chronic cerebral and cerebellar infarcts as above. 5. No evidence of major intracranial arterial occlusion or significant proximal stenosis. Electronically Signed   By: Logan Bores M.D.   On: 07/04/2016 11:58   [name] Head/brain [id number] Cm  Result Date: 07/04/2016 CLINICAL DATA:  Right-sided weakness. EXAM: MRI HEAD WITHOUT CONTRAST MRA HEAD WITHOUT CONTRAST TECHNIQUE: Multiplanar, multiecho pulse sequences of the brain and surrounding structures were obtained without intravenous contrast. Angiographic images of the head were obtained using MRA technique without contrast. COMPARISON:  Head CT 07/03/2016.  Head MRI/ MRA 12/12/2007. FINDINGS: MRI HEAD FINDINGS The examination had to be discontinued prior to completion due to patient vomiting. Sagittal T1, axial and coronal diffusion, axial T2, and axial FLAIR sequences were obtained. The the FLAIR sequence is severely motion degraded and nondiagnostic. Sagittal T1 and axial T2 sequences are mildly motion degraded. Brain: There are small foci of acute cortical and subcortical infarction in the high left frontoparietal region extending inferiorly into the centrum semiovale. There are also 2 punctate foci of acute cortical infarction in the right occipital lobe. Bilateral cerebral white matter T2 hyperintensities have progressed from 2009 and are compatible with chronic small vessel ischemic disease. Chronic right frontoparietal and left parieto-occipital infarcts are new from 2009. There is moderate cerebral atrophy. Small chronic infarcts are present in the thalami and  left cerebellum, with the thalamic infarcts new from 2009. No gross intracranial hemorrhage, mass, midline shift, or extra-axial fluid collection is seen. Vascular: Grossly preserved flow voids. Skull and upper cervical spine: No destructive osseous lesion. Sinuses/Orbits: Grossly unremarkable orbits. No significant sinus disease. Other: None. MRA HEAD FINDINGS The study is mildly motion degraded. The visualized distal vertebral arteries are patent to the basilar with the right being minimally larger than the left. There is focal narrowing versus flow artifact involving the distal left vertebral artery at the vertebrobasilar junction. PICAs and SCAs appear patent proximally with atherosclerotic narrowing versus artifact in the left SCA. The basilar artery is widely patent. There is a patent left posterior communicating artery. PCAs are patent without evidence of significant stenosis. The internal carotid arteries are widely patent from skullbase to carotid termini. ACAs and MCAs are patent without evidence of significant proximal stenosis. Detailed branch vessel evaluation is limited by motion artifact. No intracranial aneurysm is identified. IMPRESSION: 1. Incomplete, motion degraded examination as above. 2. Small acute cortical/subcortical infarcts in the high left frontoparietal region. 3. Punctate acute right occipital lobe cortical infarcts. 4. Chronic cerebral and cerebellar infarcts as above. 5. No evidence of major intracranial arterial occlusion or significant proximal stenosis. Electronically Signed   By: Logan Bores M.D.   On: 07/04/2016 11:58     Assessment/Plan: Diagnosis: Acute cortical/subcortical infarcts in left frontoparietal region, acute right occipital lobe infarcts Labs and images independently reviewed.  Records reviewed and summated above. Stroke: Continue secondary stroke prophylaxis and Risk Factor Modification listed below:   Antiplatelet therapy:   Blood Pressure Management:   Continue current medication with prn's with permisive HTN per primary team Statin Agent:   L>R sided hemiparesis: fit for orthosis to prevent contractures (resting hand splint for day, wrist cock up splint at night, PRAFO, etc)  Motor recovery: Fluoxetine   1. Does the need for close, 24 hr/day medical supervision in concert with the patient's rehab needs make it unreasonable for this patient to be served in a less intensive setting? Yes  2. Co-Morbidities requiring supervision/potential complications: A fib (monitor HR with increased physical activity), PFO, hemorrhagic CVA with spastic left hemiparesis, memory deficits and gait disorder, HTN (monitor and provide prns in accordance with increased physical exertion and pain), medication non-compliance (encourage importance of anticoagulation), hypokalemia (continue to monitor and replete as necessary), ABLA (transfuse if necessary to ensure appropriate perfusion for increased activity tolerance) 3. Due to bladder management, safety, disease management, medication administration and patient education, does the patient require 24 hr/day rehab nursing? Yes 4. Does the patient require coordinated care of a physician, rehab nurse, PT (1-2 hrs/day, 5 days/week) and OT (  1-2 hrs/day, 5 days/week) to address physical and functional deficits in the context of the above medical diagnosis(es)? Yes Addressing deficits in the following areas: balance, endurance, locomotion, strength, transferring, bathing, dressing, toileting and psychosocial support 5. Can the patient actively participate in an intensive therapy program of at least 3 hrs of therapy per day at least 5 days per week? Yes 6. The potential for patient to make measurable gains while on inpatient rehab is excellent 7. Anticipated functional outcomes upon discharge from inpatient rehab are modified independent and supervision  with PT, modified independent and supervision with OT, n/a with  SLP. 8. Estimated rehab length of stay to reach the above functional goals is: 12-16 days. 9. Does the patient have adequate social supports and living environment to accommodate these discharge functional goals? Yes 10. Anticipated D/C setting: Home 11. Anticipated post D/C treatments: HH therapy and Home excercise program 12. Overall Rehab/Functional Prognosis: good and fair  RECOMMENDATIONS: This patient's condition is appropriate for continued rehabilitative care in the following setting: CIR Patient has agreed to participate in recommended program. Yes Note that insurance prior authorization may be required for reimbursement for recommended care.  Comment: Rehab Admissions Coordinator to follow up.   Flora Lipps 07/06/2016  Delice Lesch, MD, FAAPMR    Revision History                        Routing History

## 2016-07-09 NOTE — Clinical Social Work Note (Signed)
Patient will discharge to CIR today.  CSW signing off. Consult again if any social work needs arise.  Dayton Scrape, [place]

## 2016-07-09 NOTE — Progress Notes (Signed)
Admitted to room via w/c. Oriented to rehab and therapy schedule. Reviewed medications, labs and plan of care. No questions noted. Margarito Liner

## 2016-07-09 NOTE — H&P (Signed)
Physical Medicine and Rehabilitation Admission H&P       Chief Complaint  Patient presents with  . 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 Multilevel home with bed and bath upstairs. Presented on 07/03/16 with acute onset of right sided weakness. MRI/MRA brain done revealing small acute cortical/subcortical infarcts in left frontoparietal region, acute right occipital lobe infarcts and chronic cerebral and cerebellar infarcts. Patient did not receive TPA. Carotid dopplers without significant ICA stenosis. BLE dopplers negative for DVT. 2D echo with EF 60-65% with severely dilated LA, moderate TVR and mild LVH. Patient had refused TEE. Neurology consulted presently maintained on aspirin 325 mg daily as well as subcutaneous Lovenox for DVT prophylaxis. There was consideration of Eliquis secondary to cardioembolic stroke however patient had refused thus patient remained with aspirin therapy. Physical occupational therapy evaluations completed. Recommendations for physical medicine rehabilitation consult. Patient was admitted for a comprehensive rehabilitation program.  Review of Systems  Constitutional: Negative for chills and fever.  HENT: Positive for hearing loss. Negative for tinnitus.   Eyes: Positive for blurred vision. Negative for double vision and photophobia.  Respiratory: Negative for cough and shortness of breath.   Cardiovascular: Positive for leg swelling. Negative for chest pain and palpitations.  Gastrointestinal: Positive for constipation. Negative for nausea and vomiting.  Genitourinary: Positive for frequency and  urgency. Negative for dysuria and hematuria.  Musculoskeletal: Positive for myalgias.  Skin: Negative for rash.  Neurological: Positive for weakness. Negative for seizures and loss of consciousness.  Psychiatric/Behavioral: The patient has insomnia.   All other systems reviewed and are negative.      Past Medical History:  Diagnosis Date  . Atrial fibrillation ([place])    Dr. Tollie Eth  . BPH (benign prostatic hypertrophy) 2012   prior therapy with finasteride, but as of 03/2015 no c/o; Dr. Luanne Bras  . Cellulitis of face 11/08   [facility]  . CVA (cerebral infarction) 2009, 2015  . Echocardiogram abnormal 02/11/2010   LVEF 70%, severe mitral regurgitation, pulm HTN   . ED (erectile dysfunction)    Viagra prn, consult with Urology 2012  . Elevated PSA 2012   BPH; Dr. Luanne Bras, [facility]; no more PSAs needed as of 2012 consult  . Gait abnormality    uses cane, s/p CVA  . Hemiparesis affecting left side as late effect of cerebrovascular accident ([place])   . Hiatal hernia    per 2008 EUS  . Hyperlipidemia   . Hypertension   . Microscopic hematuria    chronic, eval 2012 with Urology, benign at that time  . Obstructive uropathy 2012   Dr. Luanne Bras  . Pancreatic mass 01/2006   inconsequential per CT 2007  . 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 Patent foramen ovale    hx/o, notation of fistula on echocardiogram from 2009  . Prophylactic antibiotic 2016   requires due to valve disease  . Schatzki's ring 2008   per endoscopic ultrasound; Dr. Janetta Hora. [name]  . Stroke ([place])   . Ventral hernia    surgery consult 01/2010 with Dr.  Excell Seltzer, elective repair if desired , but he declined  . Wears hearing aid         Past Surgical History:  Procedure Laterality Date  . CARDIAC CATHETERIZATION  02/2010   LVEF 70%, severe mitral regurgitation, pulm HTN; prior in  01/2006  . INGUINAL HERNIA REPAIR     bilat  . MITRAL VALVE REPAIR  01/2006   mitral repair with Maze procedure, tricuspid annuloplasty, MAZE and LAA oversewn by Dr. Roxy Manns        Family History  Problem Relation Age of Onset  . Family history unknown: Yes   Social History:  reports that he has never smoked. He has never used smokeless tobacco. He reports that he does not drink alcohol or use drugs. Allergies:  Allergies  Allergen Reactions  . Valsartan Swelling    angioedema         Medications Prior to Admission  Medication Sig Dispense Refill  . amLODipine (NORVASC) 10 MG tablet Take 1 tablet (10 mg total) by mouth daily. 90 tablet 3  . aspirin EC 81 MG EC tablet Take 1 tablet (81 mg total) by mouth daily. 90 tablet 1  . Cholecalciferol (VITAMIN D3) 3000 UNITS TABS Take by mouth.    Marland Kitchen MAGNESIUM PO Take by mouth daily.    Marland Kitchen acetaminophen (TYLENOL) 325 MG tablet Take 1-2 tablets (325-650 mg total) by mouth every 4 (four) hours as needed for mild pain. (Patient not taking: Reported on 07/03/2016)    . hydrochlorothiazide (HYDRODIURIL) 25 MG tablet TAKE 1 TABLET BY MOUTH  DAILY 90 tablet 0  . pravastatin (PRAVACHOL) 20 MG tablet Take 1 tablet (20 mg total) by mouth daily. 90 tablet 1    Home: Home Living Family/patient expects to be discharged to:: Private residence Living Arrangements: Spouse/significant other Available Help at Discharge: Family Type of Home: House Home Access: Level entry Home Layout: Multi-level, Bed/bath upstairs, 1/2 bath on main level Alternate Level Stairs-Number of Steps: 2 sets of 7 steps  Alternate Level Stairs-Rails: Right Bathroom Shower/Tub: Tub/shower unit, Door, Multimedia programmer: Standard Bathroom Accessibility: Yes Home Equipment: Kasandra Knudsen - quad, Cane - single point, Environmental consultant - 2 wheels  Lives With: Spouse   Functional History: Prior Function Level of Independence: Independent with assistive device(s) Comments:  independent without A.D. in house and with [facility] in community. Goes to the gym with his wife 2 x week to walk. Community ambulator.  Functional Status:  Mobility: Bed Mobility Overal bed mobility: Needs Assistance Bed Mobility: Supine to Sit Supine to sit: Supervision, HOB elevated Sit to supine: Min guard, HOB elevated General bed mobility comments: increased time, no physical assist Transfers Overall transfer level: Needs assistance Equipment used: Rolling walker (2 wheeled) Transfers: Sit to/from Stand Sit to Stand: Mod assist Stand pivot transfers: Min assist General transfer comment: verbal cues for hand placement, steadying assist; small, but persistent posterior lean, requiring light mod assist to stay on his feet Ambulation/Gait Ambulation/Gait assistance: Min assist Ambulation Distance (Feet): 100 Feet Assistive device: Rolling walker (2 wheeled) Gait Pattern/deviations: Decreased stride length, Trunk flexed, Narrow base of support General Gait Details: verbal and tactile cues for posture, left lateral lean; focused on incr L stance stability with cues and tactile facilitation to activate L knee and hip extensors, and extend hip and knee in L stance; better R step length with facilitation as well Gait velocity: decreased Gait velocity interpretation: Below normal speed for age/gender    ADL: ADL Overall ADL's : Needs assistance/impaired  Eating/Feeding: Minimal assistance, Sitting Eating/Feeding Details (indicate cue type and reason): assist to open containers, pt placed foam build up on fork independently Grooming: Wash/dry hands, Standing, Min guard Upper Body Bathing: Minimal assistance, Sitting Upper Body Bathing Details (indicate cue type and reason): Pt unable to maintain static sitting balance when engaged in functional task using BUE. Falls to L Lower Body Bathing: Moderate assistance, Sit to/from stand Upper Body Dressing : Moderate assistance, Sitting Lower Body  Dressing: Min guard, Sitting/lateral leans Lower Body Dressing Details (indicate cue type and reason): donned socks with assist to balance at EOB, tends to lean L Toilet Transfer: Minimal assistance, Ambulation, RW, BSC (over toilet) Toileting- Clothing Manipulation and Hygiene: Moderate assistance, Sit to/from stand Functional mobility during ADLs: Minimal assistance, Rolling walker, Cueing for safety, Cueing for sequencing  Cognition: Cognition Overall Cognitive Status: History of cognitive impairments - at baseline Arousal/Alertness: Awake/alert Orientation Level: Oriented X4 Attention: Focused Focused Attention: Appears intact Memory: Impaired Memory Impairment: Retrieval deficit Awareness: Impaired Awareness Impairment: Emergent impairment Cognition Arousal/Alertness: Awake/alert Behavior During Therapy: WFL for tasks assessed/performed Overall Cognitive Status: History of cognitive impairments - at baseline General Comments: Pt recalling events of last OT session yesterday.  Physical Exam: Blood pressure 124/67, pulse 71, temperature 97.8 F (36.6 C), temperature source Oral, resp. rate 20, height _0  (1.626 m), weight 66.9 kg (147 lb 7.8 oz), SpO2 97 %. Physical Exam  Constitutional: He appears well-developed.  HENT:  Head: Normocephalic and atraumatic.  Eyes: EOM are normal.  Neck: Normal range of motion. Neck supple. No JVD present. No tracheal deviation present. No thyromegaly present.  Cardiovascular: Normal rate and regular rhythm.  Exam reveals no friction rub.   No murmur heard. Respiratory: No respiratory distress. He has no wheezes. He has no rales.  Decreased breath sounds at the bases  GI: Soft. Bowel sounds are normal. He exhibits no distension. There is no tenderness.  Musculoskeletal: He exhibits no edema.  Skin: Skin is warm and dry.  Musculoskeletal: He exhibits no edemaor tenderness.  Neurological: He is alertand oriented to person, place, and  time.  HOH.  Soft, slow speech.  Able to follow basic motor commands.  Sensation diminished to light touch LUE  Motor: RUE: 4/5 deltoid, bicep, tricep, wrist, HI. RLE: 4- HF, 4/5 KE and ADF/PF LUE: 4-/5 deltoid, bicep, tricep, wrist, HI. LLE: 4- HF,KE and ADF/PF Mild right limb ataxia DTRs 3+ LUE/LLE Skin: Skin is warmand dry.  Psychiatric: His mood appears appropriate/cooperative  Lab Results Last 48 Hours        Results for orders placed or performed during the hospital encounter of 07/03/16 (from the past 48 hour(s))  Basic metabolic panel     Status: None   Collection Time: 07/07/16  5:19 AM  Result Value Ref Range   Sodium 138 135 - 145 mmol/L   Potassium 3.5 3.5 - 5.1 mmol/L   Chloride 104 101 - 111 mmol/L   CO2 28 22 - 32 mmol/L   Glucose, Bld 85 65 - 99 mg/dL   BUN 13 6 - 20 mg/dL   Creatinine, Ser 0.77 0.61 - 1.24 mg/dL   Calcium 9.3 8.9 - 10.3 mg/dL   GFR calc non Af Amer >60 >60 mL/min   GFR calc Af Amer >60 >60 mL/min    Comment: (NOTE) The eGFR has been calculated using the CKD EPI equation. This calculation has not been validated in all clinical situations. eGFR's persistently <60 mL/min signify possible Chronic Kidney Disease.  Anion gap 6 5 - 15     Imaging Results (Last 48 hours)  No results found.       Medical Problem List and Plan: 1. New right hemiplegia gait disorder  secondary to acute cortical/subcortical infarct left high frontoparietal region as well as history of hemorrhagic CVA in the past with chronic spastic left hemiparesis             -admit to inpatient rehab 2.  DVT Prophylaxis/Anticoagulation: Subcutaneous Lovenox. Venous Doppler studies negative 3. Pain Management: Tylenol as needed 4. Hypertension/atrial fibrillation. Norvasc 10 mg daily. Cardiac rate controlled at present. Follow-up cardiology services. Patient currently refusing Eliquis and remains on aspirin 5. Neuropsych: This patient is capable of making  decisions on his own behalf. 6. Skin/Wound Care: Routine skin checks 7. Fluids/Electrolytes/Nutrition: Routine I&O with follow-up chemistries             -encourage PO 8. Hyperlipidemia. Pravachol     Post Admission Physician Evaluation: 1. Functional deficits secondary  to acute left fronto-parietal infarct. 2. Patient is admitted to receive collaborative, interdisciplinary care between the physiatrist, rehab nursing staff, and therapy team. 3. Patient's level of medical complexity and substantial therapy needs in context of that medical necessity cannot be provided at a lesser intensity of care such as a SNF. 4. Patient has experienced substantial functional loss from his/her baseline which was documented above under the "Functional History" and "Functional Status" headings.  Judging by the patient's diagnosis, physical exam, and functional history, the patient has potential for functional progress which will result in measurable gains while on inpatient rehab.  These gains will be of substantial and practical use upon discharge  in facilitating mobility and self-care at the household level. 5. Physiatrist will provide 24 hour management of medical needs as well as oversight of the therapy plan/treatment and provide guidance as appropriate regarding the interaction of the two. 6. The Preadmission Screening has been reviewed and patient status is unchanged unless otherwise stated above. 7. 24 hour rehab nursing will assist with bladder management, bowel management, safety, skin/wound care, disease management, medication administration, pain management and patient education  and help integrate therapy concepts, techniques,education, etc. 8. PT will assess and treat for/with: Lower extremity strength, range of motion, stamina, balance, functional mobility, safety, adaptive techniques and equipment, NMR, community reintegration.   Goals are: mod I. 9. OT will assess and treat for/with: ADL's,  functional mobility, safety, upper extremity strength, adaptive techniques and equipment, NMR, ego support, community reintegration.   Goals are: mod I. Therapy may proceed with showering this patient. 10. SLP will assess and treat for/with: n/a.  Goals are: n/a. 11. Case Management and Social Worker will assess and treat for psychological issues and discharge planning. 12. Team conference will be held weekly to assess progress toward goals and to determine barriers to discharge. 13. Patient will receive at least 3 hours of therapy per day at least 5 days per week. 14. ELOS: 7-8 days       15. Prognosis:  excellent     Meredith Staggers, MD, [facility] 07/09/2016  Cathlyn Parsons., PA-C 07/08/2016

## 2016-07-10 ENCOUNTER — Inpatient Hospital Stay (HOSPITAL_COMMUNITY): Payer: Medicare Other | Admitting: Occupational Therapy

## 2016-07-10 ENCOUNTER — Inpatient Hospital Stay (HOSPITAL_COMMUNITY): Payer: Medicare Other | Admitting: Physical Therapy

## 2016-07-10 DIAGNOSIS — R350 Frequency of micturition: Secondary | ICD-10-CM

## 2016-07-10 NOTE — Evaluation (Signed)
Physical Therapy Assessment and Plan  Patient Details  Name: Jack [name] MRN: [id number] Date of Birth: 01/23/1934  PT Diagnosis: Abnormal posture, Abnormality of gait, Ataxia, Ataxic gait and Hemiplegia dominant Rehab Potential: Excellent ELOS: 7-10    Today's Date: 07/10/2016 PT Individual Time: 1005-1104 AND 1415-1501 PT Individual Time Calculation (min): 59 min AND 46 min     Problem List:  Patient Active Problem List   Diagnosis Date Noted  . Subcortical infarction ([place]) 07/09/2016  . PFO (patent foramen ovale)   . History of CVA with residual deficit   . Benign essential HTN   . Non compliance with medical treatment   . Hypokalemia   . Acute blood loss anemia   . Acute CVA (cerebrovascular accident) ([place]) 07/05/2016  . 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 TIA (transient ischemic attack) 07/03/2016  . Right sided weakness 07/03/2016  . Microscopic hematuria 04/03/2015  . Hyperlipidemia 04/03/2015  . Wears hearing aid 04/03/2015  . Changing skin lesion 04/03/2015  . Paresthesia and pain of left extremity 04/02/2015  . Encounter for health maintenance examination in adult 04/02/2015  . Vaccine counseling 04/02/2015  . Hernia of abdominal cavity 04/02/2015  . Impaired gait and mobility 04/02/2015  . Leg swelling 03/03/2015  . Essential hypertension 03/03/2015  . Spastic hemiplegia affecting nondominant side ([place]) 12/14/2013  . ICH (intracerebral hemorrhage) ([place]) 08/02/2013  . Hx of mitral valve repair   . History of stroke   . History of atrial fibrillation   . Cyst and pseudocyst of pancreas 09/13/2007  . PVC (premature ventricular contraction)   . History of renal cyst   . BPH (benign prostatic hypertrophy)     Past Medical History:  Past Medical History:  Diagnosis Date  . Atrial fibrillation ([place])    Dr. Tollie Eth  . BPH (benign prostatic hypertrophy) 2012   prior therapy with finasteride, but as of 03/2015 no c/o; Dr.  Luanne Bras  . Cellulitis of face 11/08   [facility]  . CVA (cerebral infarction) 2009, 2015  . Echocardiogram abnormal 02/11/2010   LVEF 70%, severe mitral regurgitation, pulm HTN   . ED (erectile dysfunction)    Viagra prn, consult with Urology 2012  . Elevated PSA 2012   BPH; Dr. Luanne Bras, [facility]; no more PSAs needed as of 2012 consult  . Gait abnormality    uses cane, s/p CVA  . Hemiparesis affecting left side as late effect of cerebrovascular accident ([place])   . Hiatal hernia    per 2008 EUS  . Hyperlipidemia   . Hypertension   . Microscopic hematuria    chronic, eval 2012 with Urology, benign at that time  . Obstructive uropathy 2012   Dr. Luanne Bras  . Pancreatic mass 01/2006   inconsequential per CT 2007  . 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 Patent foramen ovale    hx/o, notation of fistula on echocardiogram from 2009  . Prophylactic antibiotic 2016   requires due to valve disease  . Schatzki's ring 2008   per endoscopic ultrasound; Dr. Janetta Hora. [name]  . Stroke ([place])   . Ventral hernia    surgery consult 01/2010 with Dr. Excell Seltzer, elective repair if desired , but he declined  . Wears hearing aid    Past Surgical History:  Past Surgical History:  Procedure Laterality Date  . CARDIAC CATHETERIZATION  02/2010   LVEF 70%, severe mitral regurgitation, pulm HTN;  prior in 01/2006  . INGUINAL HERNIA REPAIR     bilat  . MITRAL VALVE REPAIR  01/2006   mitral repair with Maze procedure, tricuspid annuloplasty, MAZE and LAA oversewn by Dr. Roxy Manns    Assessment & Plan Clinical Impression: Patient is a 82 y.o.LH-male with history of A fib maintained on aspirin 81 mg daily, PFO, hemorrhagic CVA with spastic left hemiparesis2 years ago, memory deficits and gait disorder, HTN.Per chart review patient lives with spouse independent with assistive without assistive device in the home he  uses a cane outside of the home and active going to the gym 4 days a week. Multilevel home with bed and bath upstairs. Presentedon 07/03/16 with acute onset of right sided weakness. MRI/MRA brain done revealing small acute cortical/subcortical infarcts in left frontoparietal region, acute right occipital lobe infarcts and chronic cerebral and cerebellar infarcts.Patient did not receive TPA. Patient transferred to CIR on 07/09/2016 .   Patient currently requires min with mobility secondary to muscle weakness, decreased cardiorespiratoy endurance, impaired timing and sequencing, unbalanced muscle activation and ataxia and decreased standing balance, decreased postural control, hemiplegia and decreased balance strategies.  Prior to hospitalization, patient was modified independent  with mobility and lived with Spouse in a House home.  Home access is  Level entry.  Patient will benefit from skilled PT intervention to maximize safe functional mobility, minimize fall risk and decrease caregiver burden for planned discharge home with intermittent assist.  Anticipate patient will benefit from follow up [facility] at discharge.  PT - End of Session Activity Tolerance: Tolerates 30+ min activity with multiple rests Endurance Deficit: Yes PT Assessment Rehab Potential (ACUTE/IP ONLY): Excellent Barriers to Discharge: Inaccessible home environment PT Patient demonstrates impairments in the following area(s): Balance;Endurance;Motor;Safety;Sensory PT Transfers Functional Problem(s): Bed Mobility;Car;Bed to Chair;Furniture;Floor PT Locomotion Functional Problem(s): Ambulation;Wheelchair Mobility;Stairs PT Plan PT Intensity: Minimum of 1-2 x/day ,45 to 90 minutes PT Frequency: 5 out of 7 days PT Duration Estimated Length of Stay: 7-10  PT Treatment/Interventions: Ambulation/gait training;Balance/vestibular training;Community reintegration;Discharge planning;Disease management/prevention;DME/adaptive equipment  instruction;Functional mobility training;Neuromuscular re-education;Pain management;Patient/family education;Psychosocial support;Splinting/orthotics;Therapeutic Activities;Therapeutic Exercise;UE/LE Strength taining/ROM;Stair training;UE/LE Coordination activities;Visual/perceptual remediation/compensation;Wheelchair propulsion/positioning PT Transfers Anticipated Outcome(s): Mod I with LRAD  PT Locomotion Anticipated Outcome(s): Mod I with LRAD  PT Recommendation Follow Up Recommendations: Home health PT;Outpatient PT Patient destination: Home Equipment Recommended: Rolling walker with 5" wheels;To be determined     Skilled Therapeutic Intervention  Session 1: Pt received sitting in chair and agreeable to PT  PT instructed patient in Evaluation and initiated treatment intervention; see below for details. PT educated patient in gait with RW, basic transfers, car transfer, bed mobility, balance, treatment potential and rehab goals. PT also educated patient and son in discharge plan.  PT administered Berg balance test.  Patient demonstrates increased fall risk as noted by score of  35 /56 on Berg Balance Scale.  (<36= high risk for falls, close to 100%; 37-45 significant >80%; 46-51 moderate >50%; 52-55 lower >25%)  Patient returned to room and left sitting in recliner with all needs met.   Session 2  Pt received sitting in WC and agreeable to PT  gait training instructed by PT to rehab x 165f gym with RW and close supervision assist.   PT instructed patient in  167malk test: 0.45 m/s ( <0.8 m/s indicates increased fall risk and decreased community mobility.) 5x STS: 23 seconds ( >15 seconds indicates increaed fall risk in older individuals)  Standing on airex for static and dynamic balance training.  Quiet stance. Eyes open/eyes closed 3 30sec/10 sec with min assist from PT.  Tossing ball up and down 2x 45 seconds. Min assist from PT to prevent posterior LOB.  Patient performed  gait training back to room and left sitting in recliner with call bell in reach and all needs met.      PT Evaluation Precautions/Restrictions Precautions Precautions: Fall Restrictions Weight Bearing Restrictions: No General Chart Reviewed: Yes Additional Pertinent History: History of Prior CVA with L sided residual deficits Family/Caregiver Present: No Vital SignsTherapy Vitals BP: 116/64 Pain Pain Assessment Pain Assessment: No/denies pain Home Living/Prior Functioning Home Living Available Help at Discharge: Available 24 hours/day Type of Home: House Home Access: Level entry Home Layout: Multi-level;Bed/bath upstairs;1/2 bath on main level Alternate Level Stairs-Number of Steps: 2 sets of 7 steps  Alternate Level Stairs-Rails: Right Bathroom Shower/Tub: Tub/shower unit;Door;Walk-in shower (on second level) Bathroom Toilet: Standard Bathroom Accessibility: Yes  Lives With: Spouse Prior Function Level of Independence: Independent with basic ADLs;Requires assistive device for independence (used cane outside but nothing inside)  Able to Take Stairs?: Yes Driving: No Vocation: Retired Comments: independent without A.D. in house and with [place] in community. Goes to the gym with his wife 2 x week to walk. Community ambulator. Vision/Perception    [facility] Cognition Orientation Level: Oriented X4 Sensation Sensation Light Touch: Impaired by gross assessment (able to defect all light touch stimuli, but reports decrease aquiity ) Proprioception: Impaired by gross assessment (in the LLE. ) Coordination Gross Motor Movements are Fluid and Coordinated: No Fine Motor Movements are Fluid and Coordinated: No Coordination and Movement Description: ataxia in the LLE  Heel Shin Test: impaired on the LLE. decreased speed on the R LE from average. Motor  Motor Motor: Hemiplegia Motor - Skilled Clinical Observations: L sided Hemiplegia   Mobility Bed Mobility Bed Mobility: Rolling  Right;Rolling Left;Supine to Sit;Sit to Supine Rolling Right: 5: Supervision Rolling Right Details: Verbal cues for technique Rolling Left: 5: Supervision Rolling Left Details: Verbal cues for technique Supine to Sit: 5: Supervision Supine to Sit Details: Verbal cues for technique Sit to Supine: 5: Supervision Sit to Supine - Details: Verbal cues for technique Transfers Transfers: Yes Sit to Stand: 4: Min assist Sit to Stand Details: Verbal cues for technique;Verbal cues for precautions/safety Stand Pivot Transfers: 4: Min assist Stand Pivot Transfer Details: Verbal cues for technique;Verbal cues for precautions/safety Locomotion  Ambulation Ambulation: Yes Ambulation/Gait Assistance: 4: Min assist Ambulation Distance (Feet): 160 Feet Assistive device: Rolling walker Ambulation/Gait Assistance Details: Verbal cues for gait pattern Gait Gait: Yes Gait Pattern: Left foot flat;Trunk flexed Stairs / Additional Locomotion Stairs: Yes Stairs Assistance: 4: Min assist Stair Management Technique: Two rails Number of Stairs: 12 Height of Stairs: 6 (and 3) Wheelchair Mobility Wheelchair Mobility: No  Trunk/Postural Assessment  Cervical Assessment Cervical Assessment: Within Functional Limits Thoracic Assessment Thoracic Assessment: Exceptions to [facility] Lumbar Assessment Lumbar Assessment: Exceptions to [facility] Postural Control Postural Control: Deficits on evaluation (delayed in standing with L lateral lean. )  Balance Standardized Balance Assessment Standardized Balance Assessment: Berg Balance Test Berg Balance Test Sit to Stand: Able to stand  independently using hands Standing Unsupported: Able to stand 2 minutes with supervision Sitting with Back Unsupported but Feet Supported on Floor or Stool: Able to sit safely and securely 2 minutes Stand to Sit: Controls descent by using hands Transfers: Able to transfer safely, definite need of hands Standing Unsupported with Eyes Closed:  Able to stand 10 seconds with supervision Standing Ubsupported  with Feet Together: Able to place feet together independently and stand for 1 minute with supervision From Standing, Reach Forward with Outstretched Arm: Can reach forward >12 cm safely (5") From Standing Position, Pick up Object from Floor: Able to pick up shoe, needs supervision From Standing Position, Turn to Look Behind Over each Shoulder: Looks behind one side only/other side shows less weight shift Turn 360 Degrees: Needs close supervision or verbal cueing Standing Unsupported, Alternately Place Feet on Step/Stool: Able to complete >2 steps/needs minimal assist Standing Unsupported, One Foot in Front: Needs help to step but can hold 15 seconds Standing on One Leg: Tries to lift leg/unable to hold 3 seconds but remains standing independently Total Score: 35 Static Sitting Balance Static Sitting - Level of Assistance: 6: Modified independent (Device/Increase time) Dynamic Sitting Balance Dynamic Sitting - Level of Assistance: 5: Stand by assistance Static Standing Balance Static Standing - Level of Assistance: 5: Stand by assistance Dynamic Standing Balance Dynamic Standing - Level of Assistance: 4: Min assist Extremity Assessment      RLE Assessment RLE Assessment: Within Functional Limits (grossly 4+/5 to 5/5 proximal to distal ) LLE Assessment LLE Assessment: Exceptions to [facility] (4+/5 to 5/5 proximal to distal within available range. decreaed ROM in hip and knee extension.)   See Function Navigator for Current Functional Status.   Refer to Care Plan for Long Term Goals  Recommendations for other services: Therapeutic Recreation  Pet therapy and Outing/community reintegration  Discharge Criteria: Patient will be discharged from PT if patient refuses treatment 3 consecutive times without medical reason, if treatment goals not met, if there is a change in medical status, if patient makes no progress towards goals or if  patient is discharged from hospital.  The above assessment, treatment plan, treatment alternatives and goals were discussed and mutually agreed upon: by patient  Lorie Phenix 07/10/2016, 12:39 PM

## 2016-07-10 NOTE — Progress Notes (Signed)
[place] PHYSICAL MEDICINE & REHABILITATION     PROGRESS NOTE    Subjective/Complaints: Had to void multiple times overnight. Wife reports patient had difficulties getting stream started. Denies frequency at home  ROS: pt denies nausea, vomiting, diarrhea, cough, shortness of breath or chest pain   Objective: Vital Signs: Blood pressure (!) 131/59, pulse 72, temperature 97.9 F (36.6 C), temperature source Oral, resp. rate 18, height 5\' 4"  (1.626 m), weight 66 kg (145 lb 9.6 oz), SpO2 100 %. No results found.  Recent Labs  07/09/16 1559  WBC 7.5  HGB 12.6*  HCT 37.3*  PLT 300    Recent Labs  07/08/16 0714 07/09/16 0655 07/09/16 1559  NA 138 137  --   K 3.7 3.7  --   CL 104 103  --   GLUCOSE 93 95  --   BUN 15 14  --   CREATININE 0.81 0.86 0.84  CALCIUM 8.9 8.7*  --    CBG (last 3)  No results for input(s): GLUCAP in the last 72 hours.  Wt Readings from Last 3 Encounters:  07/09/16 66 kg (145 lb 9.6 oz)  07/07/16 66.9 kg (147 lb 7.8 oz)  04/02/15 66.4 kg (146 lb 6.4 oz)    Physical Exam:  Constitutional: He appears well-developed.  HENT:  Head: Normocephalicand atraumatic.  Eyes: EOMare normal.  Neck: Normal range of motion. Neck supple. No JVDpresent. No tracheal deviationpresent. No thyromegalypresent.  Cardiovascular: RRR Respiratory: No respiratory distress.  No wheezes  Decreased breath sounds at the bases but fair air movement GI: Soft. Bowel sounds are normal. He exhibits no distension. Bruising over LLQ  Musculoskeletal: He exhibits no edema.  Skin: Skin is warmand dry.  Musculoskeletal: He exhibits no edemaor tenderness.  Neurological: He is alertand oriented to person, place, and time.  HOH.   Able to follow basic motor commands.  Sensation remains diminished to light touch LUE  Motor: RUE: 4/5 deltoid, bicep, tricep, wrist, HI. RLE: 4- HF, 4/5 KE and ADF/PF LUE: 4-/5 deltoid, bicep, tricep, wrist, HI. LLE: 4- HF,KE and  ADF/PF Mild right limb ataxia DTRs 3+ LUE/LLE Skin: Skin is warmand dry.  Psychiatric: His mood appears appropriate/cooperative  Assessment/Plan: 1. Right hemiplegia/gait disorder secondary to left high fronto-parietal infarct which require 3+ hours per day of interdisciplinary therapy in a comprehensive inpatient rehab setting. Physiatrist is providing close team supervision and 24 hour management of active medical problems listed below. Physiatrist and rehab team continue to assess barriers to discharge/monitor patient progress toward functional and medical goals.  Function:  Bathing Bathing position      Bathing parts      Bathing assist        Upper Body Dressing/Undressing Upper body dressing                    Upper body assist        Lower Body Dressing/Undressing Lower body dressing                                  Lower body assist        Toileting Toileting   Toileting steps completed by patient: Adjust clothing prior to toileting, Adjust clothing after toileting      Toileting assist Assist level: Set up/obtain supplies, Supervision or verbal cues   Transfers Chair/bed transfer             Locomotion Ambulation  Wheelchair          Cognition Comprehension Comprehension assist level: Understands basic 90% of the time/cues < 10% of the time  Expression Expression assist level: Expresses basic 90% of the time/requires cueing < 10% of the time.  Social Interaction Social Interaction assist level: Interacts appropriately with others with medication or extra time (anti-anxiety, antidepressant).  Problem Solving Problem solving assist level: Solves basic 90% of the time/requires cueing < 10% of the time  Memory Memory assist level: More than reasonable amount of time   Medical Problem List and Plan: 1. New right hemiplegia gait disordersecondary to acute cortical/subcortical infarct left high frontoparietal region  as well as history of hemorrhagic CVA in the past with chronic spastic left hemiparesis -begin rehab therapies 2. DVT Prophylaxis/Anticoagulation: Subcutaneous Lovenox. Venous Doppler studies negative 3. Pain Management: Tylenol as needed 4. Hypertension/atrial fibrillation. Norvasc 10 mg daily. Cardiac rate controlled at present. Follow-up cardiology services. Patient currently refusing Eliquisand remains on aspirin 5. Neuropsych: This patient iscapable of making decisions on hisown behalf. 6. Skin/Wound Care: Routine skin checks 7. Fluids/Electrolytes/Nutrition: labs for Monday -encourage PO 8.Hyperlipidemia. Pravachol 9. Urinary frequency:  -check PVR's  -OOB to void  -check UA,UCX   LOS (Days) 1 A FACE TO FACE EVALUATION WAS PERFORMED  Meredith Staggers, MD 07/10/2016 9:18 AM

## 2016-07-10 NOTE — Progress Notes (Signed)
Occupational Therapy Session Note  Patient Details  Name: Jack [name] MRN: [id number] Date of Birth: 1933-08-11  Today's Date: 07/10/2016 OT Individual Time: 1300-1331 OT Individual Time Calculation (min): 31 min     Short Term Goals: Week 1:  OT Short Term Goal 1 (Week 1): LTGs set a modified independent based on ELOS.  Skilled Therapeutic Interventions/Progress Updates:   Pt transferred from recliner chair to wheelchair for transfer to the therapy gym with min assist.  Same level for transfer from wheelchair to the therapy mat.  Noted increased lean to the left with both head and trunk to compensate for LLE weakness during advancement of the RLE.  Completed 9 hole peg test in 33 seconds using the RUE and 58 seconds using the RUE while sitting.  Had pt work on UE coordination and standing balance while engaged in 3 dimensional PVC pipe puzzle.  Noted pt standing with increased left lean with trunk and left knee flexion.  Max demonstrational cueing to keep left knee and hip in extension secondary to motor impersistence.  He was able to use both UEs to work on puzzle with increased time.  [name] tolerated standing for 5-6 mins without request for rest break while completing puzzle.  Transferred to wheelchair and back to recliner chair in his room again with min assist.      Therapy Documentation Precautions:  Precautions Precautions: Fall Restrictions Weight Bearing Restrictions: No  Pain: Pain Assessment Pain Assessment: No/denies pain ADL: See Function Navigator for Current Functional Status.   Therapy/Group: Individual Therapy  Kristelle Cavallaro OTR/L 07/10/2016, 4:41 PM

## 2016-07-10 NOTE — Evaluation (Signed)
Occupational Therapy Assessment and Plan  Patient Details  Name: VARUN JOURDAN MRN: [id number] Date of Birth: 06-Jan-1934  OT Diagnosis: abnormal posture, cognitive deficits, hemiplegia affecting dominant side and muscle weakness (generalized) Rehab Potential: Rehab Potential (ACUTE ONLY): Good ELOS: 8-10 days   Today's Date: 07/10/2016 OT Individual Time: 8315-1761 OT Individual Time Calculation (min): 64 min      Problem List: Patient Active Problem List   Diagnosis Date Noted  . Subcortical infarction ([place]) 07/09/2016  . PFO (patent foramen ovale)   . History of CVA with residual deficit   . Benign essential HTN   . Non compliance with medical treatment   . Hypokalemia   . Acute blood loss anemia   . Acute CVA (cerebrovascular accident) ([place]) 07/05/2016  . 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 TIA (transient ischemic attack) 07/03/2016  . Right sided weakness 07/03/2016  . Microscopic hematuria 04/03/2015  . Hyperlipidemia 04/03/2015  . Wears hearing aid 04/03/2015  . Changing skin lesion 04/03/2015  . Paresthesia and pain of left extremity 04/02/2015  . Encounter for health maintenance examination in adult 04/02/2015  . Vaccine counseling 04/02/2015  . Hernia of abdominal cavity 04/02/2015  . Impaired gait and mobility 04/02/2015  . Leg swelling 03/03/2015  . Essential hypertension 03/03/2015  . Spastic hemiplegia affecting nondominant side ([place]) 12/14/2013  . ICH (intracerebral hemorrhage) ([place]) 08/02/2013  . Hx of mitral valve repair   . History of stroke   . History of atrial fibrillation   . Cyst and pseudocyst of pancreas 09/13/2007  . PVC (premature ventricular contraction)   . History of renal cyst   . BPH (benign prostatic hypertrophy)     Past Medical History:  Past Medical History:  Diagnosis Date  . Atrial fibrillation ([place])    Dr. Tollie Eth  . BPH (benign prostatic hypertrophy) 2012   prior therapy with finasteride,  but as of 03/2015 no c/o; Dr. Luanne Bras  . Cellulitis of face 11/08   [facility]  . CVA (cerebral infarction) 2009, 2015  . Echocardiogram abnormal 02/11/2010   LVEF 70%, severe mitral regurgitation, pulm HTN   . ED (erectile dysfunction)    Viagra prn, consult with Urology 2012  . Elevated PSA 2012   BPH; Dr. Luanne Bras, [facility]; no more PSAs needed as of 2012 consult  . Gait abnormality    uses cane, s/p CVA  . Hemiparesis affecting left side as late effect of cerebrovascular accident ([place])   . Hiatal hernia    per 2008 EUS  . Hyperlipidemia   . Hypertension   . Microscopic hematuria    chronic, eval 2012 with Urology, benign at that time  . Obstructive uropathy 2012   Dr. Luanne Bras  . Pancreatic mass 01/2006   inconsequential per CT 2007  . 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 Patent foramen ovale    hx/o, notation of fistula on echocardiogram from 2009  . Prophylactic antibiotic 2016   requires due to valve disease  . Schatzki's ring 2008   per endoscopic ultrasound; Dr. Janetta Hora. [name]  . Stroke ([place])   . Ventral hernia    surgery consult 01/2010 with Dr. Excell Seltzer, elective repair if desired , but he declined  . Wears hearing aid    Past Surgical History:  Past Surgical History:  Procedure Laterality Date  . CARDIAC CATHETERIZATION  02/2010   LVEF 70%, severe mitral regurgitation, pulm HTN;  prior in 01/2006  . INGUINAL HERNIA REPAIR     bilat  . MITRAL VALVE REPAIR  01/2006   mitral repair with Maze procedure, tricuspid annuloplasty, MAZE and LAA oversewn by Dr. Roxy Manns    Assessment & Plan Clinical Impression: Patient is a 82 y.o. year old male with recent admission to the hospital on 07/03/16 with acute onset of right sided weakness. MRI/MRA brain done revealing small acute cortical/subcortical infarcts in left frontoparietal region, acute right occipital lobe infarcts and  chronic cerebral and cerebellar infarcts .  Patient transferred to CIR on 07/09/2016 .    Patient currently requires min with basic self-care skills secondary to muscle weakness, impaired timing and sequencing, unbalanced muscle activation and decreased coordination, decreased memory and decreased standing balance, hemiplegia and decreased balance strategies.  Prior to hospitalization, patient could complete ADLs with modified independent .  Patient will benefit from skilled intervention to decrease level of assist with basic self-care skills and increase independence with basic self-care skills prior to discharge home with care partner.  Anticipate patient will require 24 hour supervision and follow up outpatient.  OT - End of Session Activity Tolerance: Tolerates 10 - 20 min activity with multiple rests Endurance Deficit: Yes OT Assessment Rehab Potential (ACUTE ONLY): Good OT Patient demonstrates impairments in the following area(s): Balance;Cognition;Safety;Motor;Pain;Perception;Endurance OT Basic ADL's Functional Problem(s): Grooming;Bathing;Dressing;Toileting OT Advanced ADL's Functional Problem(s): Light Housekeeping OT Transfers Functional Problem(s): Toilet;Tub/Shower OT Additional Impairment(s): Fuctional Use of Upper Extremity OT Plan OT Intensity: Minimum of 1-2 x/day, 45 to 90 minutes OT Frequency: 5 out of 7 days OT Duration/Estimated Length of Stay: 8-10 days OT Treatment/Interventions: Balance/vestibular training;DME/adaptive equipment instruction;Disease mangement/prevention;Cognitive remediation/compensation;Discharge planning;Patient/family education;Functional mobility training;Neuromuscular re-education;UE/LE Strength taining/ROM;Therapeutic Exercise;Therapeutic Activities;UE/LE Coordination activities;Self Care/advanced ADL retraining OT Self Feeding Anticipated Outcome(s): modified independent OT Basic Self-Care Anticipated Outcome(s): modified independent OT Toileting  Anticipated Outcome(s): modified independent OT Bathroom Transfers Anticipated Outcome(s): modified independent OT Recommendation Patient destination: Home Follow Up Recommendations: 24 hour supervision/assistance Equipment Recommended: To be determined   Skilled Therapeutic Intervention Pt completed bathing shower level with dressing sit to stand at the sink.  Increased body and head tilt to the left with mobility secondary to history of previous left UE and LE mild hemiparesis.  Decreased ability to use the LUE to assist with buttons but he was able to complete with just the right side.  HOH but has bilateral hearing aides, which his wife assisted him with donning this session.  Prior to this he could complete that task on his own.  Min assist for functional mobility and transfers without use of assistive device.  Pt given RW for support during transfers in the room with nursing.  Pt left in room with spouse and instruction to call nursing if he needs to go to the bathroom.    OT Evaluation Precautions/Restrictions  Precautions Precautions: Fall Restrictions Weight Bearing Restrictions: No  Pain  No report of pain  Home Living/Prior Functioning Home Living Available Help at Discharge: Available 24 hours/day Type of Home: House Home Access: Level entry Home Layout: Multi-level, Bed/bath upstairs, 1/2 bath on main level Alternate Level Stairs-Number of Steps: 2 sets of 7 steps  Alternate Level Stairs-Rails: Right Bathroom Shower/Tub: Tub/shower unit, Door, Walk-in shower (on second level) Armed forces training and education officer: Yes  Lives With: Spouse IADL History Homemaking Responsibilities: Yes Cleaning Responsibility: Secondary Homemaking Comments: helped with vaccuming and cleaning up Current License: No Occupation: Retired Prior Function Level of Independence: Independent with basic ADLs, Requires assistive  device for independence (used cane outside but nothing  inside)  Able to Take Stairs?: Yes Driving: No Vocation: Retired Comments: independent without A.D. in house and with [place] in community. Goes to the gym with his wife 2 x week to walk. Community ambulator. ADL  See Function Section of chart for details  Vision/Perception  Vision- History Baseline Vision/History: Wears glasses Wears Glasses: At all times Patient Visual Report: No change from baseline Vision- Assessment Vision Assessment?: Yes Eye Alignment: Within Functional Limits Tracking/Visual Pursuits: Decreased smoothness of horizontal tracking;Decreased smoothness of vertical tracking;Other (comment) (Pt with delayed tracking as well as jerky tracking in most directions.) Visual Fields: No apparent deficits  Cognition Overall Cognitive Status: History of cognitive impairments - at baseline Arousal/Alertness: Awake/alert Orientation Level: Person;Place;Situation Person: Oriented Place: Oriented Situation: Oriented Year: 2017 Month: December Day of Week: Incorrect Memory: Impaired Memory Impairment: Decreased short term memory;Decreased recall of new information Decreased Short Term Memory: Functional basic Immediate Memory Recall: Sock;Blue;Bed Memory Recall: Blue;Bed;Sock Memory Recall Sock: With Cue Memory Recall Blue: Without Cue Memory Recall Bed: Without Cue Attention: Focused Focused Attention: Appears intact Awareness: Appears intact Safety/Judgment: Appears intact Sensation Sensation Light Touch: Appears Intact (intact in BUEs) Stereognosis: Appears Intact Hot/Cold: Appears Intact Proprioception: Impaired Detail Proprioception Impaired Details: Impaired RUE Additional Comments: Decreased proprioception in the right elbow with testing.  Coordination Gross Motor Movements are Fluid and Coordinated: No Fine Motor Movements are Fluid and Coordinated: No Coordination and Movement Description: Pt with history of left hemiparesis from previous CVA.  Decreased  gross and FM coordination noted but uses functionally with selfcare tasks at a diminished level.   Motor  Motor Motor: Hemiplegia Motor - Skilled Clinical Observations: L sided Hemiplegia  Mobility  Bed Mobility Supine to Sit: 5: Supervision Transfers Transfers: Sit to Stand;Stand to Sit Sit to Stand: 4: Min assist Stand to Sit: 4: Min assist  Trunk/Postural Assessment  Cervical Assessment Cervical Assessment: Exceptions to [facility] (cervical flexion at rest) Thoracic Assessment Thoracic Assessment: Exceptions to [facility] (kyphosis present) Lumbar Assessment Lumbar Assessment: Exceptions to [facility] (demonstrates posterior pelvic tilt as well lateral tilt to the left. ) Postural Control Postural Control: Deficits on evaluation  Balance Balance Balance Assessed: Yes Static Sitting Balance Static Sitting - Level of Assistance: 6: Modified independent (Device/Increase time) Dynamic Sitting Balance Dynamic Sitting - Level of Assistance: 5: Stand by assistance Sitting balance - Comments: Leans L with donning sock and shoe Static Standing Balance Static Standing - Level of Assistance: 4: Min assist Dynamic Standing Balance Dynamic Standing - Level of Assistance: 4: Min assist Extremity/Trunk Assessment RUE Assessment RUE Assessment:  (AROM WFLS with strength at 4/5 throughout.  Nine hole peg test also completed in 33 seconds.  Slight decreased finger to nose testing compared to normal. ) LUE Assessment LUE Assessment: Exceptions to [facility] (Pt still with slight synergy pattern with shoulder flexion but demonstrates isolated movement at all other joints.  Strength 4/5 througout but decreased FM coordination noted with tying shoes.  )   See Function Navigator for Current Functional Status.   Refer to Care Plan for Long Term Goals  Recommendations for other services: None    Discharge Criteria: Patient will be discharged from OT if patient refuses treatment 3 consecutive times without medical  reason, if treatment goals not met, if there is a change in medical status, if patient makes no progress towards goals or if patient is discharged from hospital.  The above assessment, treatment plan, treatment alternatives and goals were discussed  and mutually agreed upon: by patient  [place] OTR/L 07/10/2016, 4:31 PM

## 2016-07-11 LAB — URINALYSIS, ROUTINE W REFLEX MICROSCOPIC
Bacteria, UA: NONE SEEN
Bilirubin Urine: NEGATIVE
GLUCOSE, UA: NEGATIVE mg/dL
Ketones, ur: 5 mg/dL — AB
Leukocytes, UA: NEGATIVE
Nitrite: NEGATIVE
PH: 5 (ref 5.0–8.0)
Protein, ur: NEGATIVE mg/dL
SPECIFIC GRAVITY, URINE: 1.023 (ref 1.005–1.030)

## 2016-07-11 NOTE — Progress Notes (Signed)
[name] PHYSICAL MEDICINE & REHABILITATION     PROGRESS NOTE    Subjective/Complaints: Slept better last night. Bladder not as active. Wife states he had some apneic like moments while she watched him sleep. Pt reports no distress.  ROS: pt denies nausea, vomiting, diarrhea, cough, shortness of breath or chest pain    Objective: Vital Signs: Blood pressure 124/65, pulse 69, temperature 97.8 F (36.6 C), temperature source Oral, resp. rate 18, height 5\' 4"  (1.626 m), weight 66 kg (145 lb 9.6 oz), SpO2 99 %. No results found.  Recent Labs  07/09/16 1559  WBC 7.5  HGB 12.6*  HCT 37.3*  PLT 300    Recent Labs  07/09/16 0655 07/09/16 1559  NA 137  --   K 3.7  --   CL 103  --   GLUCOSE 95  --   BUN 14  --   CREATININE 0.86 0.84  CALCIUM 8.7*  --    CBG (last 3)  No results for input(s): GLUCAP in the last 72 hours.  Wt Readings from Last 3 Encounters:  07/09/16 66 kg (145 lb 9.6 oz)  07/07/16 66.9 kg (147 lb 7.8 oz)  04/02/15 66.4 kg (146 lb 6.4 oz)    Physical Exam:  Constitutional: He appears well-developed.  HENT:  Head: Normocephalicand atraumatic.  Eyes: EOMare normal.  Neck: Normal range of motion. Neck supple. No JVDpresent. No tracheal deviationpresent. No thyromegalypresent.  Cardiovascular: RRR Respiratory: clear, no wheezes    GI: Soft. Bowel sounds are normal. He exhibits no distension. Bruising over LLQ  Musculoskeletal: He exhibits no edema.  Skin: Skin is warmand dry.  Musculoskeletal: He exhibits no edemaor tenderness.  Neurological: He is alertand oriented to person, place, and time.  HOH.   Able to follow basic motor commands.  Sensation remains diminished to light touch LUE  Motor: RUE: 4/5 deltoid, bicep, tricep, wrist, HI. RLE: 4- HF, 4/5 KE and ADF/PF (stable) LUE: 4-/5 deltoid, bicep, tricep, wrist, HI. LLE: 4- HF,KE and ADF/PF (stable) Mild right limb ataxia DTRs 3+ LUE/LLE Skin: Skin is warmand dry.  Psychiatric:  His mood appears appropriate/cooperative  Assessment/Plan: 1. Right hemiplegia/gait disorder secondary to left high fronto-parietal infarct which require 3+ hours per day of interdisciplinary therapy in a comprehensive inpatient rehab setting. Physiatrist is providing close team supervision and 24 hour management of active medical problems listed below. Physiatrist and rehab team continue to assess barriers to discharge/monitor patient progress toward functional and medical goals.  Function:  Bathing Bathing position   Position: Shower  Bathing parts Body parts bathed by patient: Right arm, Left arm, Chest, Abdomen, Front perineal area, Buttocks, Right upper leg, Left upper leg, Right lower leg, Left lower leg, Back    Bathing assist Assist Level: Touching or steadying assistance(Pt > 75%)      Upper Body Dressing/Undressing Upper body dressing   What is the patient wearing?: Pull over shirt/dress, Button up shirt     Pull over shirt/dress - Perfomed by patient: Thread/unthread right sleeve, Thread/unthread left sleeve, Put head through opening, Pull shirt over trunk     Button up shirt - Perfomed by helper: Thread/unthread right sleeve, Thread/unthread left sleeve, Pull shirt around back, Button/unbutton shirt    Upper body assist Assist Level: Supervision or verbal cues      Lower Body Dressing/Undressing Lower body dressing   What is the patient wearing?: Underwear, Pants, [employer], Shoes Underwear - Performed by patient: Thread/unthread right underwear leg, Thread/unthread left underwear leg, Pull  underwear up/down   Pants- Performed by patient: Thread/unthread right pants leg, Thread/unthread left pants leg, Pull pants up/down, Fasten/unfasten pants           Shoes - Performed by patient: Don/doff right shoe, Don/doff left shoe, Fasten right, Fasten left         TED Hose - Performed by helper: Don/doff right TED hose, Don/doff left TED hose  Lower body assist  Assist for lower body dressing: Touching or steadying assistance (Pt > 75%)      Toileting Toileting   Toileting steps completed by patient: Adjust clothing prior to toileting, Adjust clothing after toileting      Toileting assist Assist level: Touching or steadying assistance (Pt.75%)   Transfers Chair/bed transfer   Chair/bed transfer method: Stand pivot Chair/bed transfer assist level: Touching or steadying assistance (Pt > 75%) Chair/bed transfer assistive device: Armrests, Medical sales representative     Max distance: 173ft  Assist level: Touching or steadying assistance (Pt > 75%)   Wheelchair Wheelchair activity did not occur: N/A        Cognition Comprehension Comprehension assist level: Understands basic 90% of the time/cues < 10% of the time  Expression Expression assist level: Expresses basic 90% of the time/requires cueing < 10% of the time.  Social Interaction Social Interaction assist level: Interacts appropriately with others with medication or extra time (anti-anxiety, antidepressant).  Problem Solving Problem solving assist level: Solves basic 90% of the time/requires cueing < 10% of the time  Memory Memory assist level: More than reasonable amount of time   Medical Problem List and Plan: 1. New right hemiplegia gait disordersecondary to acute cortical/subcortical infarct left high frontoparietal region as well as history of hemorrhagic CVA in the past with chronic spastic left hemiparesis -continue therapies 2. DVT Prophylaxis/Anticoagulation: Subcutaneous Lovenox. Venous Doppler studies negative 3. Pain Management: Tylenol as needed 4. Hypertension/atrial fibrillation. Norvasc 10 mg daily. Cardiac rate controlled at present. Follow-up cardiology services. Patient currently refusing Eliquisand remains on aspirin 5. Neuropsych: This patient iscapable of making decisions on hisown behalf. 6. Skin/Wound Care: Routine skin checks 7.  Fluids/Electrolytes/Nutrition: labs for Monday -encourage PO 8.Hyperlipidemia. Pravachol 9. Urinary frequency: some improvement  -check PVR's  -OOB to void  -ordered UA,UCX   LOS (Days) 2 A FACE TO FACE EVALUATION WAS PERFORMED  Meredith Staggers, MD 07/11/2016 8:40 AM

## 2016-07-12 ENCOUNTER — Inpatient Hospital Stay (HOSPITAL_COMMUNITY): Payer: Medicare Other | Admitting: Occupational Therapy

## 2016-07-12 DIAGNOSIS — I482 Chronic atrial fibrillation: Secondary | ICD-10-CM

## 2016-07-12 DIAGNOSIS — N390 Urinary tract infection, site not specified: Secondary | ICD-10-CM

## 2016-07-12 DIAGNOSIS — A499 Bacterial infection, unspecified: Secondary | ICD-10-CM

## 2016-07-12 MED ORDER — AMOXICILLIN 250 MG PO CAPS
250.0000 mg | ORAL_CAPSULE | Freq: Three times a day (TID) | ORAL | Status: DC
  Administered 2016-07-12 – 2016-07-13 (×3): 250 mg via ORAL
  Filled 2016-07-12 (×3): qty 1

## 2016-07-12 NOTE — Progress Notes (Signed)
[place] PHYSICAL MEDICINE & REHABILITATION     PROGRESS NOTE    Subjective/Complaints: Still having urinary urgency. Denies any other problems. Up with OT this morning  ROS: pt denies nausea, vomiting, diarrhea, cough, shortness of breath or chest pain    Objective: Vital Signs: Blood pressure 136/67, pulse 75, temperature 98.3 F (36.8 C), temperature source Oral, resp. rate 18, height 5\' 4"  (1.626 m), weight 66 kg (145 lb 9.6 oz), SpO2 97 %. No results found.  Recent Labs  07/09/16 1559  WBC 7.5  HGB 12.6*  HCT 37.3*  PLT 300    Recent Labs  07/09/16 1559  CREATININE 0.84   CBG (last 3)  No results for input(s): GLUCAP in the last 72 hours.  Wt Readings from Last 3 Encounters:  07/09/16 66 kg (145 lb 9.6 oz)  07/07/16 66.9 kg (147 lb 7.8 oz)  04/02/15 66.4 kg (146 lb 6.4 oz)    Physical Exam:  Constitutional: He appears well-developed.  HENT:  Head: Normocephalicand atraumatic.  Eyes: EOMare normal.  Neck: Normal range of motion. Neck supple. No JVDpresent. No tracheal deviationpresent. No thyromegalypresent.  Cardiovascular: RRR Respiratory: clear, no wheezes    GI: Soft. Bowel sounds are normal. He exhibits no distension. Bruising over LLQ  Musculoskeletal: He exhibits no edema.  Skin: Skin is warmand dry.  Musculoskeletal: He exhibits no edemaor tenderness.  Neurological: He is alertand oriented to person, place, and time.  HOH.   Able to follow basic motor commands.  Sensation remains diminished to light touch LUE  Motor: RUE: 4/5 deltoid, bicep, tricep, wrist, HI. RLE: 4- HF, 4/5 KE and ADF/PF (stable) LUE: 4-/5 deltoid, bicep, tricep, wrist, HI. LLE: 4- HF,KE and ADF/PF (stable) Mild right limb ataxia DTRs 3+ LUE/LLE Skin: Skin is warmand dry.  Psychiatric: His mood appears appropriate/cooperative  Assessment/Plan: 1. Right hemiplegia/gait disorder secondary to left high fronto-parietal infarct which require 3+ hours per day of  interdisciplinary therapy in a comprehensive inpatient rehab setting. Physiatrist is providing close team supervision and 24 hour management of active medical problems listed below. Physiatrist and rehab team continue to assess barriers to discharge/monitor patient progress toward functional and medical goals.  Function:  Bathing Bathing position   Position: Shower  Bathing parts Body parts bathed by patient: Right arm, Left arm, Chest, Abdomen, Front perineal area, Buttocks, Right upper leg, Left upper leg, Right lower leg, Left lower leg, Back    Bathing assist Assist Level: Touching or steadying assistance(Pt > 75%)      Upper Body Dressing/Undressing Upper body dressing   What is the patient wearing?: Pull over shirt/dress, Button up shirt     Pull over shirt/dress - Perfomed by patient: Thread/unthread right sleeve, Thread/unthread left sleeve, Put head through opening, Pull shirt over trunk     Button up shirt - Perfomed by helper: Thread/unthread right sleeve, Thread/unthread left sleeve, Pull shirt around back, Button/unbutton shirt    Upper body assist Assist Level: Supervision or verbal cues      Lower Body Dressing/Undressing Lower body dressing   What is the patient wearing?: Underwear, Pants, [employer], Shoes Underwear - Performed by patient: Thread/unthread right underwear leg, Thread/unthread left underwear leg, Pull underwear up/down   Pants- Performed by patient: Thread/unthread right pants leg, Thread/unthread left pants leg, Pull pants up/down, Fasten/unfasten pants           Shoes - Performed by patient: Don/doff right shoe, Don/doff left shoe, Fasten right, Fasten left  TED Hose - Performed by helper: Don/doff right TED hose, Don/doff left TED hose  Lower body assist Assist for lower body dressing: Touching or steadying assistance (Pt > 75%)      Toileting Toileting   Toileting steps completed by patient: Adjust clothing prior to toileting,  Adjust clothing after toileting   Toileting Assistive Devices: Grab bar or rail  Toileting assist Assist level: Touching or steadying assistance (Pt.75%)   Transfers Chair/bed transfer   Chair/bed transfer method: Stand pivot Chair/bed transfer assist level: Touching or steadying assistance (Pt > 75%) Chair/bed transfer assistive device: Armrests, Medical sales representative     Max distance: 155ft  Assist level: Touching or steadying assistance (Pt > 75%)   Wheelchair Wheelchair activity did not occur: N/A        Cognition Comprehension Comprehension assist level: Understands basic 90% of the time/cues < 10% of the time  Expression Expression assist level: Expresses basic 90% of the time/requires cueing < 10% of the time.  Social Interaction Social Interaction assist level: Interacts appropriately with others - No medications needed.  Problem Solving Problem solving assist level: Solves complex problems: With extra time  Memory Memory assist level: More than reasonable amount of time   Medical Problem List and Plan: 1. New right hemiplegia gait disordersecondary to acute cortical/subcortical infarct left high frontoparietal region as well as history of hemorrhagic CVA in the past with chronic spastic left hemiparesis -continue therapies 2. DVT Prophylaxis/Anticoagulation: Subcutaneous Lovenox. Venous Doppler studies negative 3. Pain Management: Tylenol as needed 4. Hypertension/atrial fibrillation. Norvasc 10 mg daily. Cardiac rate controlled at present. Follow-up cardiology services. Patient currently refusing Eliquisand remains on aspirin 5. Neuropsych: This patient iscapable of making decisions on hisown behalf. 6. Skin/Wound Care: Routine skin checks 7. Fluids/Electrolytes/Nutrition: re-check labs this week -encourage PO 8.Hyperlipidemia. Pravachol 9. Urinary frequency: persistent  -PVR's appear low  -OOB to void  -[facility] with 100k  enterobacter-begin empiric amoxil   LOS (Days) 3 A FACE TO FACE EVALUATION WAS PERFORMED  Alger Simons T, MD 07/12/2016 1:16 PM

## 2016-07-12 NOTE — Progress Notes (Signed)
Occupational Therapy Session Note  Patient Details  Name: Jack [name] MRN: [id number] Date of Birth: 10-05-1933  Today's Date: 07/12/2016 OT Individual Time: 0910-1020 OT Individual Time Calculation (min): 70 min     Short Term Goals:Week 1:  OT Short Term Goal 1 (Week 1): LTGs set a modified independent based on ELOS.  Skilled Therapeutic Interventions/Progress Updates:    Pt seen for OT session focusing on functional ambulation and transfers. Pt sitting up in w/c upon arrival with wife present and agreeable to tx session. In ADL apartment. Discussed bathroom DME and shower chair vs. Tub transfer bench. Pt already has shower chair and therefore preferred using walk in shower to avoid having to buy another piece of DME. Following demonstration for proper technique, pt completed simulated shower stall transfer with supervision x2 trials.  He then ambulated in kitchen and practiced removing and replacing items from overhead cabinet. Pt tolerated ~5 minutes of dynamic standing with supervision to complete task. Seated rest breaks provided on low soft surface couch with pt able to complete sit <> stands from couch supervision- mod I.  He was taken off unit in w/c total A for time and energy conservation. Practiced ambulating in community environment through hospital cafe/ eating area. Completed with supervision using RW. Completed second trial without AD with min A and VCs for L foot clearance as pt with less follow through during step without use of AD.  Pt returned to room at end of session, completed standing toielting task with supervision. Pt left seated in w/c at end of session, all needs in reach and wife present.   Therapy Documentation Precautions:  Precautions Precautions: Fall Restrictions Weight Bearing Restrictions: No Pain:   No/ denies pain  See Function Navigator for Current Functional Status.   Therapy/Group: Individual Therapy  Lewis, Shanese Riemenschneider C 07/12/2016, 6:55 AM

## 2016-07-12 NOTE — IPOC Note (Addendum)
Overall Plan of Care [facility]) Patient Details Name: Jack [name] MRN: [id number] DOB: 1933/11/13  Admitting Diagnosis: CVA  Hospital Problems: Principal Problem:   Subcortical infarction [facility]) Active Problems:   History of atrial fibrillation   Spastic hemiplegia affecting nondominant side ([place])   Essential hypertension     Functional Problem List: Nursing Endurance, Medication Management  PT Balance, Endurance, Motor, Safety, Sensory  OT Balance, Cognition, Safety, Motor, Pain, Perception, Endurance  SLP    TR         Basic ADL's: OT Grooming, Bathing, Dressing, Toileting     Advanced  ADL's: OT Light Housekeeping     Transfers: PT Bed Mobility, Car, Bed to Chair, [employer], Futures trader, Tub/Shower     Locomotion: PT Ambulation, Emergency planning/management officer, Stairs     Additional Impairments: OT Fuctional Use of Upper Extremity  SLP        TR      Anticipated Outcomes Item Anticipated Outcome  Self Feeding modified independent  Swallowing      Basic self-care  modified independent  Toileting  modified independent   Bathroom Transfers modified independent  Bowel/Bladder  n/a  Transfers  Mod I with LRAD   Locomotion  Mod I with LRAD   Communication     Cognition     Pain  n/a  Safety/Judgment  maintain safety with cues/supervion   Therapy Plan: PT Intensity: Minimum of 1-2 x/day ,45 to 90 minutes PT Frequency: 5 out of 7 days PT Duration Estimated Length of Stay: 7-10  OT Intensity: Minimum of 1-2 x/day, 45 to 90 minutes OT Frequency: 5 out of 7 days OT Duration/Estimated Length of Stay: 8-10 days         Team Interventions: Nursing Interventions Patient/Family Education, Disease Management/Prevention, Discharge Planning, Medication Management  PT interventions Ambulation/gait training, Training and development officer, Community reintegration, Discharge planning, Disease management/prevention, DME/adaptive equipment instruction, Functional  mobility training, Neuromuscular re-education, Pain management, Patient/family education, Psychosocial support, Splinting/orthotics, Therapeutic Activities, Therapeutic Exercise, UE/LE Strength taining/ROM, Stair training, UE/LE Coordination activities, Visual/perceptual remediation/compensation, Wheelchair propulsion/positioning  OT Interventions Training and development officer, DME/adaptive equipment instruction, Disease mangement/prevention, Cognitive remediation/compensation, Discharge planning, Patient/family education, Functional mobility training, Neuromuscular re-education, UE/LE Strength taining/ROM, Therapeutic Exercise, Therapeutic Activities, UE/LE Coordination activities, Self Care/advanced ADL retraining  SLP Interventions    TR Interventions    SW/CM Interventions Discharge Planning, Psychosocial Support, Patient/Family Education    Team Discharge Planning: Destination: PT-Home ,OT- Home , SLP-  Projected Follow-up: PT-Home health PT, Outpatient PT, OT-  24 hour supervision/assistance, SLP-  Projected Equipment Needs: PT-Rolling walker with 5" wheels, To be determined, OT- To be determined, SLP-  Equipment Details: PT- , OT-  Patient/family involved in discharge planning: PT- Patient,  OT-Patient, Family member/caregiver, SLP-   MD ELOS: 7-8d Medical Rehab Prognosis:  Good Assessment:  83 y.o.LH-male with history of A fib maintained on aspirin 81 mg daily, PFO, hemorrhagic CVA with spastic left hemiparesis2 years ago, memory deficits and gait disorder, HTN.Per chart review patient lives with spouse independent with assistive without assistive device in the home he uses a cane outside of the home and active going to the gym 4 days a week. Multilevel home with bed and bath upstairs. Presentedon 07/03/16 with acute onset of right sided weakness. MRI/MRA brain done revealing small acute cortical/subcortical infarcts in left frontoparietal region, acute right occipital lobe infarcts and  chronic cerebral and cerebellar infarcts.Patient did not receive TPA.Carotid dopplers without significant ICA stenosis. BLE dopplers negative for DVT. 2D echo with  EF 60-65% with severely dilated LA, moderate TVR and mild LVH.Patient had refused TEE. Neurology consulted presently maintained on aspirin 325 mg daily as well as subcutaneous Lovenox for DVT prophylaxis. There was consideration of Eliquissecondary to cardioembolic stroke however patient had refused thus patient remained with aspirin therapy   Now requiring 24/7 Rehab RN,MD, as well as CIR level PT, OT and SLP.  Treatment team will focus on ADLs and mobility with goals set at Mod I See Team Conference Notes for weekly updates to the plan of care

## 2016-07-12 NOTE — Progress Notes (Signed)
Occupational Therapy Session Note  Patient Details  Name: Jack [name] MRN: [id number] Date of Birth: [birth year]  Today's Date: 07/12/2016 OT Individual Time: [phone] and [id number] OT Individual Time Calculation (min): 77 min and 46 min   Short Term Goals: Week 1:  OT Short Term Goal 1 (Week 1): LTGs set a modified independent based on ELOS.     Skilled Therapeutic Interventions/Progress Updates:   Pt was just finishing up breakfast at time of arrival. Spouse [name] was present. Pt was agreeable to complete ADLs. Pt used RW per preference. He ambulated to drawers to collect ADL items with instruction on adaptive transport technique with DME. Toilet transfer/tasks and shower transfer completed with supervision and mod tactile cues for walker safety. Pt bathed with supervision, exhibiting functional coordination abilities with L and R UE, pt preferring to use dominant L UE. Afterwards he completed dressing w/c level at sink. Wife assisted with hearing aides but pt reported that he still could not hear with them in place. OT stood on right side and enunciated words deliberately for lip reading. [name] also helped with communication. He then stood at sink for oral care/grooming tasks. Afterwards pt was propelled to therapy apartment and practiced tub bench transfers. Pt and spouse reported that there is walk in shower as well as tub shower at home. He used a stool without a back PTA in tub shower. [name] reported that she was unaware if walker and tub bench/seat with back support would fit in their bathroom. [name] was provided with home measurement sheet to assess bathroom accessibility and anticipate DME needs. [name] was also trained on toilet transfers during session and safety plan updated to include her. At end of tx pt was returned to room and left with [facility] and all needs within reach.   2nd Session 1:1 Tx (46 min) Pt participated in skilled OT session focusing on dynamic standing balance without  device during IADL completion. Pt ambulated to therapy apartment with supervision and RW for min safety cuing with positioning of DME. Pt participated in vacuuming task with Min A for balance without device and afterwards stocked pantries with food items in simulated unloading groceries activity. Pt able to carry bags of items around kitchen with Min A for slight unsteadiness. Afterwards pt ambulated back to room in manner as written above with RW. Discussed home measurement sheet results with [facility] and pt. Doorways to home bathroom/bedroom are wide enough to accommodate RW. Toilet height matches height of bathroom toilet at hospital which pt can transfer on/off of with supervision.  Pt left with spouse at time of departure.    Therapy Documentation Precautions:  Precautions Precautions: Fall Restrictions Weight Bearing Restrictions: No   Pain: No c/o pain during sessions    ADL:  :    See Function Navigator for Current Functional Status.   Therapy/Group: Individual Therapy  Marthe Dant A Sher Shampine 07/12/2016, 12:31 PM

## 2016-07-13 ENCOUNTER — Inpatient Hospital Stay (HOSPITAL_COMMUNITY): Payer: Medicare Other | Admitting: Physical Therapy

## 2016-07-13 ENCOUNTER — Inpatient Hospital Stay (HOSPITAL_COMMUNITY): Payer: Medicare Other | Admitting: Occupational Therapy

## 2016-07-13 LAB — COMPREHENSIVE METABOLIC PANEL
ALK PHOS: 39 U/L (ref 38–126)
ALT: 25 U/L (ref 17–63)
ANION GAP: 6 (ref 5–15)
AST: 26 U/L (ref 15–41)
Albumin: 3.9 g/dL (ref 3.5–5.0)
BUN: 13 mg/dL (ref 6–20)
CO2: 29 mmol/L (ref 22–32)
CREATININE: 0.83 mg/dL (ref 0.61–1.24)
Calcium: 9 mg/dL (ref 8.9–10.3)
Chloride: 100 mmol/L — ABNORMAL LOW (ref 101–111)
GFR calc Af Amer: >60 mL/min (ref >60)
GFR calc non Af Amer: >60 mL/min (ref >60)
GLUCOSE: 121 mg/dL — AB (ref 65–99)
Potassium: 3.6 mmol/L (ref 3.5–5.1)
SODIUM: 135 mmol/L (ref 135–145)
TOTAL PROTEIN: 7 g/dL (ref 6.5–8.1)
Total Bilirubin: 0.8 mg/dL (ref 0.3–1.2)

## 2016-07-13 LAB — CBC WITH DIFFERENTIAL/PLATELET
BASOS PCT: 0 %
Basophils Absolute: 0 10*3/uL (ref 0.0–0.1)
EOS ABS: 0.3 10*3/uL (ref 0.0–0.7)
Eosinophils Relative: 4 %
HCT: 42.2 % (ref 39.0–52.0)
HEMOGLOBIN: 13.7 g/dL (ref 13.0–17.0)
Lymphocytes Relative: 18 %
Lymphs Abs: 1.4 10*3/uL (ref 0.7–4.0)
MCH: 29 pg (ref 26.0–34.0)
MCHC: 32.5 g/dL (ref 30.0–36.0)
MCV: 89.4 fL (ref 78.0–100.0)
Monocytes Absolute: 0.6 10*3/uL (ref 0.1–1.0)
Monocytes Relative: 9 %
NEUTROS PCT: 69 %
Neutro Abs: 5.2 10*3/uL (ref 1.7–7.7)
Platelets: 329 10*3/uL (ref 150–400)
RBC: 4.72 MIL/uL (ref 4.22–5.81)
RDW: 14.7 % (ref 11.5–15.5)
WBC: 7.6 10*3/uL (ref 4.0–10.5)

## 2016-07-13 LAB — URINE CULTURE: Culture: >=100000 — AB

## 2016-07-13 LAB — GLUCOSE, CAPILLARY: Glucose-Capillary: 87 mg/dL (ref 65–99)

## 2016-07-13 MED ORDER — SULFAMETHOXAZOLE-TRIMETHOPRIM 400-80 MG PO TABS
1.0000 | ORAL_TABLET | Freq: Two times a day (BID) | ORAL | Status: DC
  Administered 2016-07-13 – 2016-07-16 (×7): 1 via ORAL
  Filled 2016-07-13 (×7): qty 1

## 2016-07-13 MED ORDER — HYDROCHLOROTHIAZIDE 12.5 MG PO CAPS
12.5000 mg | ORAL_CAPSULE | Freq: Every day | ORAL | Status: DC
  Administered 2016-07-13 – 2016-07-16 (×4): 12.5 mg via ORAL
  Filled 2016-07-13 (×4): qty 1

## 2016-07-13 NOTE — Progress Notes (Signed)
Social Work  Social Work Assessment and Plan  Patient Details  Name: Jack [name] MRN: [id number] Date of Birth: [birth year]  Today's Date: 07/13/2016  Problem List:  Patient Active Problem List   Diagnosis Date Noted  . Subcortical infarction ([place]) 07/09/2016  . PFO (patent foramen ovale)   . History of CVA with residual deficit   . Benign essential HTN   . Non compliance with medical treatment   . Hypokalemia   . Acute blood loss anemia   . Acute CVA (cerebrovascular accident) ([place]) 07/05/2016  . Embolic stroke involving left middle cerebral artery ([place])   . TIA (transient ischemic attack) 07/03/2016  . Right sided weakness 07/03/2016  . Microscopic hematuria 04/03/2015  . Hyperlipidemia 04/03/2015  . Wears hearing aid 04/03/2015  . Changing skin lesion 04/03/2015  . Paresthesia and pain of left extremity 04/02/2015  . Encounter for health maintenance examination in adult 04/02/2015  . Vaccine counseling 04/02/2015  . Hernia of abdominal cavity 04/02/2015  . Impaired gait and mobility 04/02/2015  . Leg swelling 03/03/2015  . Essential hypertension 03/03/2015  . Spastic hemiplegia affecting nondominant side ([place]) 12/14/2013  . ICH (intracerebral hemorrhage) ([place]) 08/02/2013  . Hx of mitral valve repair   . History of stroke   . History of atrial fibrillation   . Cyst and pseudocyst of pancreas 09/13/2007  . PVC (premature ventricular contraction)   . History of renal cyst   . BPH (benign prostatic hypertrophy)    Past Medical History:  Past Medical History:  Diagnosis Date  . Atrial fibrillation ([place])    Dr. Tollie Eth  . BPH (benign prostatic hypertrophy) 2012   prior therapy with finasteride, but as of 03/2015 no c/o; Dr. Luanne Bras  . Cellulitis of face 11/08   [facility]  . CVA (cerebral infarction) 2009, 2015  . Echocardiogram abnormal 02/11/2010   LVEF 70%, severe mitral regurgitation, pulm HTN   . ED (erectile dysfunction)    Viagra prn,  consult with Urology 2012  . Elevated PSA 2012   BPH; Dr. Luanne Bras, [facility]; no more PSAs needed as of 2012 consult  . Gait abnormality    uses cane, s/p CVA  . Hemiparesis affecting left side as late effect of cerebrovascular accident ([place])   . Hiatal hernia    per 2008 EUS  . Hyperlipidemia   . Hypertension   . Microscopic hematuria    chronic, eval 2012 with Urology, benign at that time  . Obstructive uropathy 2012   Dr. Luanne Bras  . Pancreatic mass 01/2006   inconsequential per CT 2007  . 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 Patent foramen ovale    hx/o, notation of fistula on echocardiogram from 2009  . Prophylactic antibiotic 2016   requires due to valve disease  . Schatzki's ring 2008   per endoscopic ultrasound; Dr. Janetta Hora. [name]  . Stroke ([place])   . Ventral hernia    surgery consult 01/2010 with Dr. Excell Seltzer, elective repair if desired , but he declined  . Wears hearing aid    Past Surgical History:  Past Surgical History:  Procedure Laterality Date  . CARDIAC CATHETERIZATION  02/2010   LVEF 70%, severe mitral regurgitation, pulm HTN; prior in 01/2006  . INGUINAL HERNIA REPAIR     bilat  . MITRAL VALVE REPAIR  01/2006   mitral repair with Maze procedure, tricuspid annuloplasty, MAZE and LAA oversewn by Dr. Roxy Manns  Social History:  reports that he has never smoked. He has never used smokeless tobacco. He reports that he does not drink alcohol or use drugs.  Family / Support Systems Marital Status: Married How Long?: 9 yrs Patient Roles: Spouse, Parent, Other (Comment) (grandparent) Spouse/Significant Other: wife, Jamual Bass @ [phone] Children: Pt reports that 2 of their 3 sons are living locally Anticipated Caregiver: wife Ability/Limitations of Caregiver: no limitations Caregiver Availability: 24/7 Family Dynamics: Wife very supportive as well as local sons.    Social  History Preferred language: English Religion: Baptist Cultural Background: NA Read: Yes Write: Yes Employment Status: Retired Date Retired/Disabled/Unemployed: None Freight forwarder Issues: None Guardian/Conservator: None - per MD, pt is capable of making decisions on his own behalf   Abuse/Neglect Physical Abuse: Denies Verbal Abuse: Denies Sexual Abuse: Denies Exploitation of patient/patient's resources: Denies Self-Neglect: Denies  Emotional Status Pt's affect, behavior adn adjustment status: Pt with rather flat affect and HOH but able to complete assessment interview without difficulty.  Offers brief but accurate answers.  He feels he is funcitoning close to his baseline "but my left side is a little weak..."  Pt denies any significant emotional distress but will monitor an refer for neuropsychology consult as indicated. Recent Psychosocial Issues: None - pt and wife very active Pyschiatric History: None Substance Abuse History: None  Patient / Family Perceptions, Expectations & Goals Pt/Family understanding of illness & functional limitations: Pt able to report basic information about his stroke and resulting deficits.  Wife with good understanding of medical issues. Premorbid pt/family roles/activities: Pt and wife active in the community and going to the gym 5-6 times per week. Anticipated changes in roles/activities/participation: Little change anticipated if pt able to reach targeted mod ind goals. Pt/family expectations/goals: "I need by left side to get better."  [employer]: Other (Comment) (Silver Sneakers program at [employer]) Premorbid Home Care/DME Agencies: Other (Comment) [facility] after 2015 CVA) Transportation available at discharge: yes - wife drives  Discharge Planning Living Arrangements: Spouse/significant other Support Systems: Spouse/significant other, Children Type of Residence: Private residence Insurance Resources: [employer]  ([place]) Financial Resources: Social Security Financial Screen Referred: No Living Expenses: Own Money Management: Spouse Does the patient have any problems obtaining your medications?: No Home Management: Pt and wife share responsibilities. Patient/Family Preliminary Plans: Returning home with wife able to provide any needed assistance. Social Work Anticipated Follow Up Needs: HH/OP Expected length of stay: 7-10 days  Clinical Impression Pleasant, elderly gentleman here following a CVA and familiar to CIR from 2015 stay.  Wife at bedside and very supportive.  They are pleased with progress so far and hoping for a short LOS.  Pt and wife deny any concerns about dc.  Will follow for support and d/c planning needs.  Osa Fogarty 07/13/2016, 4:13 PM

## 2016-07-13 NOTE — Progress Notes (Addendum)
[facility]     PROGRESS NOTE    Subjective/Complaints: Still with urinary symptoms. Notes some swelling in legs by end of day. Wearing TEDs. On HCTZ at home.  ROS: pt denies nausea, vomiting, diarrhea, cough, shortness of breath or chest pain     Objective: Vital Signs: Blood pressure (!) 121/56, pulse 72, temperature 98.3 F (36.8 C), temperature source Oral, resp. rate 18, height 5\' 4"  (1.626 m), weight 70 kg (154 lb 5.2 oz), SpO2 96 %. No results found. No results for input(s): WBC, HGB, HCT, PLT in the last 72 hours. No results for input(s): NA, K, CL, GLUCOSE, BUN, CREATININE, CALCIUM in the last 72 hours.  Invalid input(s): CO CBG (last 3)   Recent Labs  07/13/16 0518  GLUCAP 87    Wt Readings from Last 3 Encounters:  07/13/16 70 kg (154 lb 5.2 oz)  07/07/16 66.9 kg (147 lb 7.8 oz)  04/02/15 66.4 kg (146 lb 6.4 oz)    Physical Exam:  Constitutional: He appears well-developed.  HENT:  Head: Normocephalicand atraumatic.  Eyes: EOMare normal.  Neck: Normal range of motion. Neck supple. No JVDpresent. No tracheal deviationpresent. No thyromegalypresent.  Cardiovascular: RRR Respiratory: CTA no wheezes or rales   GI: Soft. Bowel sounds are normal. He exhibits no distension. Bruising over LLQ  Musculoskeletal: He exhibits no edema.  Skin: Skin is warmand dry.  Musculoskeletal: He exhibits no LE edemaor tenderness.  Neurological: He is alertand oriented to person, place, and time.  HOH.   Able to follow basic motor commands.  Sensation remains diminished to light touch LUE  Motor: RUE: 4/5 deltoid, bicep, tricep, wrist, HI. RLE: 4- HF, 4/5 KE and ADF/PF (no changes) LUE: 4-/5 deltoid, bicep, tricep, wrist, HI. LLE: 4- HF,KE and ADF/PF (stable) Mild right limb ataxia but improved DTRs 3+ LUE/LLE Skin: Skin is warmand dry.  Psychiatric: His mood appears appropriate/cooperative  Assessment/Plan: 1. Right  hemiplegia/gait disorder secondary to left high fronto-parietal infarct which require 3+ hours per day of interdisciplinary therapy in a comprehensive inpatient rehab setting. Physiatrist is providing close team supervision and 24 hour management of active medical problems listed below. Physiatrist and rehab team continue to assess barriers to discharge/monitor patient progress toward functional and medical goals.  Function:  Bathing Bathing position   Position: Shower  Bathing parts Body parts bathed by patient: Right arm, Left arm, Chest, Abdomen, Front perineal area, Buttocks, Right upper leg, Left upper leg, Right lower leg, Left lower leg, Back    Bathing assist Assist Level: Touching or steadying assistance(Pt > 75%)      Upper Body Dressing/Undressing Upper body dressing   What is the patient wearing?: Button up shirt, Pull over shirt/dress     Pull over shirt/dress - Perfomed by patient: Thread/unthread right sleeve, Thread/unthread left sleeve, Put head through opening, Pull shirt over trunk   Button up shirt - Perfomed by patient: Thread/unthread right sleeve, Pull shirt around back, Button/unbutton shirt Button up shirt - Perfomed by helper: Thread/unthread left sleeve    Upper body assist Assist Level: Supervision or verbal cues      Lower Body Dressing/Undressing Lower body dressing   What is the patient wearing?: Underwear, Pants, [employer], Shoes Underwear - Performed by patient: Thread/unthread right underwear leg, Thread/unthread left underwear leg, Pull underwear up/down   Pants- Performed by patient: Thread/unthread right pants leg, Thread/unthread left pants leg, Pull pants up/down, Fasten/unfasten pants  Shoes - Performed by patient: Don/doff right shoe, Don/doff left shoe, Fasten right, Fasten left         TED Hose - Performed by helper: Don/doff right TED hose, Don/doff left TED hose  Lower body assist Assist for lower body dressing:  Supervision or verbal cues      Toileting Toileting   Toileting steps completed by patient: Adjust clothing prior to toileting, Performs perineal hygiene, Adjust clothing after toileting   Toileting Assistive Devices: Grab bar or rail  Toileting assist Assist level: Supervision or verbal cues   Transfers Chair/bed transfer   Chair/bed transfer method: Ambulatory Chair/bed transfer assist level: Supervision or verbal cues Chair/bed transfer assistive device: Armrests, Medical sales representative     Max distance: 11ft  Assist level: Touching or steadying assistance (Pt > 75%)   Wheelchair Wheelchair activity did not occur: N/A        Cognition Comprehension Comprehension assist level: Follows complex conversation/direction with extra time/assistive device  Expression Expression assist level: Expresses complex ideas: With extra time/assistive device  Social Interaction Social Interaction assist level: Interacts appropriately with others - No medications needed.  Problem Solving Problem solving assist level: Solves complex problems: With extra time  Memory Memory assist level: Assistive device: No helper   Medical Problem List and Plan: 1. New right hemiplegia gait disordersecondary to acute cortical/subcortical infarct left high frontoparietal region as well as history of hemorrhagic CVA in the past with chronic spastic left hemiparesis -continue therapies  -team conference today 2. DVT Prophylaxis/Anticoagulation: Subcutaneous Lovenox. Venous Doppler studies negative 3. Pain Management: Tylenol as needed 4. Hypertension/atrial fibrillation. Norvasc 10 mg daily. Cardiac rate controlled at present. Follow-up cardiology services. Patient currently refusing Eliquisand remains on aspirin  -will resume low dose hctz per home regimen (0nly 12.5mg ) 5. Neuropsych: This patient iscapable of making decisions on hisown behalf. 6. Skin/Wound Care: Routine skin  checks 7. Fluids/Electrolytes/Nutrition: labs pending -encourage PO 8.Hyperlipidemia. Pravachol 9. Urinary frequency/eterobacter UTI:   -PVR's appear low  -OOB to void  -07/13/16---5 day course of bactrim   LOS (Days) 4 A FACE TO FACE EVALUATION WAS PERFORMED  Meredith Staggers, MD 07/13/2016 8:35 AM

## 2016-07-13 NOTE — Care Management Note (Signed)
Inpatient [facility] Individual Statement of Services  Patient Name:  Jack [name]  Date:  07/13/2016  Welcome to the [facility].  Our goal is to provide you with an individualized program based on your diagnosis and situation, designed to meet your specific needs.  With this comprehensive rehabilitation program, you will be expected to participate in at least 3 hours of rehabilitation therapies Monday-Friday, with modified therapy programming on the weekends.  Your rehabilitation program will include the following services:  Physical Therapy (PT), Occupational Therapy (OT), 24 hour per day rehabilitation nursing, Therapeutic Recreaction (TR), Case Management (Social Worker), Rehabilitation Medicine, Nutrition Services and Pharmacy Services  Weekly team conferences will be held on Tuesdays to discuss your progress.  Your Social Worker will talk with you frequently to get your input and to update you on team discussions.  Team conferences with you and your family in attendance may also be held.  Expected length of stay: 7 days  Overall anticipated outcome: supervision/ mod independent  Depending on your progress and recovery, your program may change. Your Social Worker will coordinate services and will keep you informed of any changes. Your Social Worker's name and contact numbers are listed  below.  The following services may also be recommended but are not provided by the [facility] will be made to provide these services after discharge if needed.  Arrangements include referral to agencies that provide these services.  Your insurance has been verified to be:  [facility] Medicare Your primary doctor is:  Physicist, medical  Pertinent information will be shared with your doctor and your insurance company.  Social Worker:  [place],  [place] or ([id number]   Information discussed with and copy given to patient by: Lennart Pall, 07/13/2016, 4:22 PM

## 2016-07-13 NOTE — Progress Notes (Signed)
Patient information reviewed and entered into eRehab system by Ivie Maese, RN, CRRN, PPS Coordinator.  Information including medical coding and functional independence measure will be reviewed and updated through discharge.     Per nursing patient was given "Data Collection Information Summary for Patients in Inpatient Rehabilitation Facilities with attached "Privacy Act Statement-Health Care Records" upon admission.  

## 2016-07-13 NOTE — Progress Notes (Signed)
Physical Therapy Session Note  Patient Details  Name: Jack [name] MRN: [id number] Date of Birth: [birth year]  Today's Date: 07/13/2016 PT Individual Time: 0915-1030 and 1300-1400 PT Individual Time Calculation (min): 75 min and 60 min (total 135 min)    Short Term Goals: Week 1:  PT Short Term Goal 1 (Week 1): STG=LTG due to LOS   Skilled Therapeutic Interventions/Progress Updates:   Tx 1: Pt received seated in chair in room with wife present, denies pain and agreeable to treatment. Gait to gym with RW and S x175'. Standing balance on airex pad to facilitate ankle and righting strategies while performing dynamic UE reaching to retrieve cards and match to board at various heights. Dynamic balance stepping R/L and forward/backward over walking pole; minA initially decreased to min guard with repetition. 4 square step test x5 reps; final trial performed in 33 seconds indicating high risk for falls. Step up/down on airex pad 2x10 reps with modA faded to min guard with repetition and improving foot clearance. Stairs x12 on 6" height with 1 handrail and SPC, min guard. Gait x75' with SPC and min guard. Gait 2x150' with SPC and min guard. Nustep x10 min with BUE/BLE for aerobic endurance and strengthening. Gait to return to room with RW and S. Remained seated in chair with all needs in reach; pt verbalizes understanding to recommendation that staff be present in room when ambulating/transferring.   Tx 2: Pt received seated in chair with wife present; denies pain and agreeable to treatment. Gait to gym with RW and S x180'. Performed floor transfer with S; educated pt on precautions following a fall including when to call emergency services. Lateral step ups on 6" steps with BUE support and close S; occasional facilitation at L glute for activation. Biodex catch game on static and dynamic surface for focus on weight shifting and ankle strategy; began BUE support faded to no UE with static and dynamic platform.  Standing balance alternating toe taps to 3" and 6" step with min guard overall. Gait to return to room with RW and S as above; mod verbal/tactile cues for upright posture, scapular retraction and forward gaze. Remained seated in chair with wife present at end of session.   Therapy Documentation Precautions:  Precautions Precautions: Fall Restrictions Weight Bearing Restrictions: No   See Function Navigator for Current Functional Status.   Therapy/Group: Individual Therapy  Luberta Mutter 07/13/2016, 10:18 AM

## 2016-07-13 NOTE — Progress Notes (Signed)
Occupational Therapy Session Note  Patient Details  Name: Jack [name] MRN: [id number] Date of Birth: [birth year]  Today's Date: 07/13/2016 OT Individual Time: [id number] and 1420-1450 OT Individual Time Calculation (min): 60 min and 30 min    Short Term Goals:Week 1:  OT Short Term Goal 1 (Week 1): LTGs set a modified independent based on ELOS.  Skilled Therapeutic Interventions/Progress Updates:    Session One: Pt seen for OT ADL bathing/dressing session. Pt asleep in supine upon arrival with wife present. PT easily awoken and agreeable to tx session. He ambulated throughout session with RW and supervision. He bathed seated on tub bench, min A required when seated LOB episode when attempting to cross ankle over knee to wash feet. He stood with use of grab bar to complete buttock hygiene.  He dressed seated in chair at sink standing without need for AD to pull pants up. He completed grooming tasks standing at sink with supervision. Pt provided with medium grade (pink) theraputty as well as handouts for exercises. Reviewed all exercises addressing gross and fine pinches. Encouraged pt to complete exercises outside of tx time. Pt returned to chair at end of session, left set-up with breakfast tray and all needs in reach.   Session Two: Pt seen for OT tx session focusing on neuro re-ed with functional standing balance and fine motor coordination. Pt sitting up in chair upon arrival with NT present collecting vitals. Pt agreeable to tx session. He ambulated to therapy gym using RW with supervision. In gym, completed pipe tree activity while standing on foam wedge addressing standing balance/ endurance. Initially requiring min-mod assist for balance and 1 UE support, however, progressed to min steadying assist from therapist while pt completed task using B UEs. Completed x2 trials with seated rest break btwn trials. Pt ambulated back to room using SPC. Required min A for balance and occasional mod A in  highly distracting environment. Recommended to pt and wife to cont ambulation with RW for right now. Pt in agreement as he voiced becoming more easily fatigued with SPC vs. RW.  Pt left sitting in chair at end of session, wife present and all needs in reach.   Therapy Documentation Precautions:  Precautions Precautions: Fall Restrictions Weight Bearing Restrictions: No  See Function Navigator for Current Functional Status.   Therapy/Group: Individual Therapy  Lewis, Daniell Paradise C 07/13/2016, 7:05 AM

## 2016-07-14 ENCOUNTER — Inpatient Hospital Stay (HOSPITAL_COMMUNITY): Payer: Medicare Other | Admitting: Physical Therapy

## 2016-07-14 ENCOUNTER — Inpatient Hospital Stay (HOSPITAL_COMMUNITY): Payer: Medicare Other | Admitting: Occupational Therapy

## 2016-07-14 NOTE — Progress Notes (Signed)
[facility] PHYSICAL MEDICINE & REHABILITATION     PROGRESS NOTE    Subjective/Complaints: No new issues. Bladder less frequent. No incontinence  ROS: pt denies nausea, vomiting, diarrhea, cough, shortness of breath or chest pain     Objective: Vital Signs: Blood pressure 120/66, pulse 70, temperature 98.7 F (37.1 C), temperature source Oral, resp. rate 18, height 5\' 4"  (1.626 m), weight 70 kg (154 lb 5.2 oz), SpO2 98 %. No results found.  Recent Labs  07/13/16 0845  WBC 7.6  HGB 13.7  HCT 42.2  PLT 329    Recent Labs  07/13/16 0845  NA 135  K 3.6  CL 100*  GLUCOSE 121*  BUN 13  CREATININE 0.83  CALCIUM 9.0   CBG (last 3)   Recent Labs  07/13/16 0518  GLUCAP 87    Wt Readings from Last 3 Encounters:  07/13/16 70 kg (154 lb 5.2 oz)  07/07/16 66.9 kg (147 lb 7.8 oz)  04/02/15 66.4 kg (146 lb 6.4 oz)    Physical Exam:  Constitutional: He appears well-developed.  HENT:  Head: Normocephalicand atraumatic.  Eyes: EOMare normal.  Neck: Normal range of motion. Neck supple. No JVDpresent. No tracheal deviationpresent. No thyromegalypresent.  Cardiovascular: RRR Respiratory: CTA no wheezes or rales   GI: Soft. Bowel sounds are normal. He exhibits no distension. Bruising over LLQ  Musculoskeletal: He exhibits no edema.  Skin: Skin is warmand dry.  Musculoskeletal: He exhibits no current LE edemaor tenderness.  Neurological: He is alertand oriented to person, place, and time.  HOH.   Able to follow basic motor commands.  Sensation remains diminished to light touch LUE per baseline  Motor: RUE: 4/5 deltoid, bicep, tricep, wrist, HI. RLE: 4- HF, 4/5 KE and ADF/PF (no changes) LUE: 4-/5 deltoid, bicep, tricep, wrist, HI. LLE: 4- HF,KE and ADF/PF (stable) Mild right limb ataxia but improved DTRs 3+ LUE/LLE Skin: Skin is warmand dry.  Psychiatric: His mood is pleasant  Assessment/Plan: 1. 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 Physiatrist is providing close team supervision and 24 hour management of active medical problems listed below. Physiatrist and rehab team continue to assess barriers to discharge/monitor patient progress toward functional and medical goals.  Function:  Bathing Bathing position   Position: Shower  Bathing parts Body parts bathed by patient: Right arm, Left arm, Chest, Abdomen, Front perineal area, Buttocks, Right upper leg, Left upper leg, Right lower leg, Left lower leg, Back    Bathing assist Assist Level: Supervision or verbal cues      Upper Body Dressing/Undressing Upper body dressing   What is the patient wearing?: Button up shirt, Pull over shirt/dress     Pull over shirt/dress - Perfomed by patient: Thread/unthread right sleeve, Thread/unthread left sleeve, Put head through opening, Pull shirt over trunk   Button up shirt - Perfomed by patient: Thread/unthread right sleeve, Pull shirt around back, Button/unbutton shirt, Thread/unthread left sleeve Button up shirt - Perfomed by helper: Thread/unthread left sleeve    Upper body assist Assist Level: Supervision or verbal cues   Set up : To obtain clothing/put away  Lower Body Dressing/Undressing Lower body dressing   What is the patient wearing?: Underwear, Pants, [employer], Shoes Underwear - Performed by patient: Thread/unthread right underwear leg, Thread/unthread left underwear leg, Pull underwear up/down   Pants- Performed by patient: Pull pants up/down, Fasten/unfasten pants Pants- Performed by helper:  Thread/unthread right pants leg, Thread/unthread left pants leg (wife assisting)         Shoes - Performed by patient: Don/doff right shoe, Don/doff left shoe, Fasten right, Fasten left         TED Hose - Performed by helper: Don/doff right TED hose, Don/doff left TED hose  Lower body  assist Assist for lower body dressing: Touching or steadying assistance (Pt > 75%)      Toileting Toileting   Toileting steps completed by patient: Adjust clothing prior to toileting, Performs perineal hygiene, Adjust clothing after toileting   Toileting Assistive Devices: Grab bar or rail  Toileting assist Assist level: Supervision or verbal cues   Transfers Chair/bed transfer   Chair/bed transfer method: Ambulatory Chair/bed transfer assist level: Supervision or verbal cues Chair/bed transfer assistive device: Armrests, Medical sales representative     Max distance: 175 Assist level: Supervision or verbal cues   Wheelchair Wheelchair activity did not occur: N/A        Cognition Comprehension Comprehension assist level: Follows complex conversation/direction with extra time/assistive device  Expression Expression assist level: Expresses complex ideas: With extra time/assistive device  Social Interaction Social Interaction assist level: Interacts appropriately with others - No medications needed.  Problem Solving Problem solving assist level: Solves complex problems: With extra time  Memory Memory assist level: Assistive device: No helper   Medical Problem List and Plan: 1. 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 DVT Prophylaxis/Anticoagulation: Subcutaneous Lovenox. Venous Doppler studies negative 3. Pain Management: Tylenol as needed 4. Hypertension/atrial fibrillation. Norvasc 10 mg daily. Cardiac rate controlled at present. Follow-up cardiology services. Patient currently refusing Eliquisand remains on aspirin  -resumed low dose hctz per home regimen (0nly 12.5mg ) 5. Neuropsych: This patient iscapable of making decisions on hisown behalf. 6.  Skin/Wound Care: Routine skin checks 7. Fluids/Electrolytes/Nutrition: labs pending -encourage PO 8.Hyperlipidemia. Pravachol 9. Urinary frequency/eterobacter UTI:   -PVR's appear low, no incontinence  -OOB to void  -07/13/16---5 day course of bactrim   LOS (Days) 5 A FACE TO FACE EVALUATION WAS PERFORMED  Meredith Staggers, MD 07/14/2016 8:44 AM

## 2016-07-14 NOTE — Progress Notes (Signed)
Occupational Therapy Session Note  Patient Details  Name: KALEY MANJARRES MRN: [id number] Date of Birth: [birth year]  Today's Date: 07/14/2016 OT Individual Time: 1000-1056 OT Individual Time Calculation (min): 56 min     Short Term Goals:Week 1:  OT Short Term Goal 1 (Week 1): LTGs set a modified independent based on ELOS.  Skilled Therapeutic Interventions/Progress Updates:    Pt seen for OT session focusing on neuro re-ed with fine motor coordination and sitting balance. Pt sitting up in chair upon arrival, had completed bathing/dressing with PT. He ambulated to therapy gym with supervision using RW. Finished reviewed theraputty HEP provided to pt yesterday addressing finger abduction and extension in medium grade theraputty. Then had pt complete fine motor skill of placing and removing beads from putty.  Seated on EOM, completed core strengthening/ stability exercise with pt required to recline back onto wedge mat and then come forward to upright sitting. Pt with very pronounced posterior pelvic tilt in sitting causing him to loose balance posteriorly with movement, however, with VCs  For upright posture pt able to self correct. He then completed reaching task to B sides, requiring to reach up and out of BOS for trunk elongation and stability. Pt ambulated back to room at end of session, left seated in chair with all needs in reach. Reviewed use of call bell and need for assist with mobility. Discussed with pt recommendation for [facility] as use of shower chair due to decreased sitting balance. Pt's wife not present for session, but will inform her of recommendation during next tx session.   Therapy Documentation Precautions:  Precautions Precautions: Fall Restrictions Weight Bearing Restrictions: No Pain:   No/ denies pain  See Function Navigator for Current Functional Status.   Therapy/Group: Individual Therapy  Lewis, Cherica Heiden C 07/14/2016, 7:17 AM

## 2016-07-14 NOTE — Progress Notes (Signed)
Physical Therapy Session Note  Patient Details  Name: Jack [name] MRN: [id number] Date of Birth: 1933-12-22  Today's Date: 07/14/2016 PT Individual Time: 0730-0900 and 1300-1415 PT Individual Time Calculation (min): 90 min and 75 min (total 165 min)    Short Term Goals: Week 1:  PT Short Term Goal 1 (Week 1): STG=LTG due to LOS   Skilled Therapeutic Interventions/Progress Updates:   Tx 1: Pt received seated in chair with wife present; denies pain and agreeable to treatment. Pt requesting to shower before participation in therapy. Pt showered with S at sit <>stand level on shower chair with wife providing setupA to simulate home environment and prepare for d/c home. Wife provides assist for drying off body after shower, and modA for dressing. Gait to/from gym with RW and S 2x175'. Dynamic standing balance with toe taps to number clock and cones for weight shifting, single limb stance, LE coordination; modA initially faded to min guard with repetition and improving righting reactions and ankle strategy. Dynamic gait with RW locating/retrieving numbered targets at various heights to simulate retrieving objects from kitchen/rooms at home. Min guard for retrieving objects off floor. Stairs 2x16 on 6" height steps with 1 handrail and S; self-selected reciprocal ascent/descent. Nustep x10 min with BUE/BLE for strengthening and aerobic endurance. Returned to room with gait as above. Ambulated in/out of bathroom with RW and performed toileting with S. Remained seated in chair at completion of session all needs in reach; pt verbalizes understanding to call for assist before getting up.   Tx 2: Pt received seated in chair with wife present; denies pain and agreeable to treatment. Gait in/out of bathroom with RW and S. Performed toileting in standing with modI. Gait to/from gym with RW and distant S. Performed obstacle course with RW, without AD and backward without AD for coordination; S with RW and min guard  without AD. Standing LE exercises including sit <>stand, heel raises, hip abduction, hip flexion marching. Performed gait over ramp, mulch and curb with RW and S. Seated UE ergometer on level 6.5 x8 min for UE strengthening and endurance. Gait to return to room with RW and S. Remained seated in recliner at end of session, wife present and all needs in reach.   Therapy Documentation Precautions:  Precautions Precautions: Fall Restrictions Weight Bearing Restrictions: No   See Function Navigator for Current Functional Status.   Therapy/Group: Individual Therapy  Luberta Mutter 07/14/2016, 8:44 AM

## 2016-07-15 ENCOUNTER — Inpatient Hospital Stay (HOSPITAL_COMMUNITY): Payer: Medicare Other | Admitting: Physical Therapy

## 2016-07-15 ENCOUNTER — Inpatient Hospital Stay (HOSPITAL_COMMUNITY): Payer: Medicare Other | Admitting: Occupational Therapy

## 2016-07-15 NOTE — Progress Notes (Signed)
Physical Therapy Session Note  Patient Details  Name: Jack [name] MRN: [id number] Date of Birth: March 29, 1934  Today's Date: 07/15/2016 PT Individual Time: 0900-1015 and 1245-1400 PT Individual Time Calculation (min): 75 min and 75 min (total 150 min)    Short Term Goals: Week 1:  PT Short Term Goal 1 (Week 1): STG=LTG due to LOS   Skilled Therapeutic Interventions/Progress Updates:   Tx 1: Pt received seated in room with wife present; denies pain and agreeable to treatment. Gait in community setting through gift shop and food court with RW and modI, no rest breaks needed due to fatigue. Gait on curb step, ramp, mulch uneven surface with RW and S. Performed car transfer modI. Seated/supine/standing LE exercises 2x15 reps each, and hip external rotation stretch BLE x2 min each. Pt provided with HEP handout for performance at home and verbalizes/demonstrates understanding of all exercises. Nustep x10 min with BUE/BLE for strengthening and aerobic endurance. Gait to return to room 2x175' on flat surface and on carpet with RW and modI.   Tx 2: Pt received asleep in bed, easily awoken and agreeable to treatment. Gait to gym with RW and modI. Performed ascent/descent of 24 6" stairs with 1 handrail with S overall, reciprocal gait pattern. Assessed berg with score 43/56 as indicated in d/c summary; score improved over eval of 35/56 however continued high risk for falls. Educated pt on risk for falls and recommendation for use of RW at d/c; pt verbalizes understanding. Performed 4 square step test x4 trials; second trial timed for 24 sec total, improved over eval. Assessed TUG at 24 seconds indicating increased risk for falls. Performed dynavision in standing on airex pad; note significantly slower reaction speed in top quadrants, likely due to forward flexed posture and kyphosis. Pt returned to room with gait modI. Pt and wife with no further questions regarding d/c at this time. Remained seated in w/c at end  of session, all needs in reach.   Therapy Documentation Precautions:  Precautions Precautions: Fall Restrictions Weight Bearing Restrictions: No Pain: Pain Assessment Pain Assessment: No/denies pain   See Function Navigator for Current Functional Status.   Therapy/Group: Individual Therapy  Luberta Mutter 07/15/2016, 9:56 AM

## 2016-07-15 NOTE — Progress Notes (Signed)
[place] PHYSICAL MEDICINE & REHABILITATION     PROGRESS NOTE    Subjective/Complaints: Up with OT. No complaints. Bladder improved.   ROS: pt denies nausea, vomiting, diarrhea, cough, shortness of breath or chest pain    Objective: Vital Signs: Blood pressure (!) 113/41, pulse 75, temperature 98.3 F (36.8 C), temperature source Oral, resp. rate 18, height 5\' 4"  (1.626 m), weight 64.5 kg (142 lb 3.2 oz), SpO2 95 %. No results found.  Recent Labs  07/13/16 0845  WBC 7.6  HGB 13.7  HCT 42.2  PLT 329    Recent Labs  07/13/16 0845  NA 135  K 3.6  CL 100*  GLUCOSE 121*  BUN 13  CREATININE 0.83  CALCIUM 9.0   CBG (last 3)   Recent Labs  07/13/16 0518  GLUCAP 87    Wt Readings from Last 3 Encounters:  07/15/16 64.5 kg (142 lb 3.2 oz)  07/07/16 66.9 kg (147 lb 7.8 oz)  04/02/15 66.4 kg (146 lb 6.4 oz)    Physical Exam:  Constitutional: He appears well-developed.  HENT:  Head: Normocephalicand atraumatic.  Eyes: EOMare normal.  Neck: Normal range of motion. Neck supple. No JVDpresent. No tracheal deviationpresent. No thyromegalypresent.  Cardiovascular: RRR Respiratory: CTA no wheezes or rales   GI: Soft. Bowel sounds are normal. He exhibits no distension. Bruising over LLQ  Musculoskeletal: He exhibits no edema.  Skin: Skin is warmand dry.  Musculoskeletal: He exhibits no current LE edemaor tenderness.  Neurological: He is alertand oriented to person, place, and time.  HOH.   Able to follow basic motor commands.  Sensation remains diminished to light touch LUE per baseline  Motor: RUE: 4/5 deltoid, bicep, tricep, wrist, HI. RLE: 4- HF, 4/5 KE and ADF/PF (stable) LUE: 4-/5 deltoid, bicep, tricep, wrist, HI. LLE: 4- HF,KE and ADF/PF --no change -drops right hip during gait  DTRs 3+ LUE/LLE Skin: Skin is warmand dry.  Psychiatric: His mood is pleasant  Assessment/Plan: 1. 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 Physiatrist is providing close team supervision and 24 hour management of active medical problems listed below. Physiatrist and rehab team continue to assess barriers to discharge/monitor patient progress toward functional and medical goals.  Function:  Bathing Bathing position   Position: Shower  Bathing parts Body parts bathed by patient: Right arm, Left arm, Chest, Abdomen, Front perineal area, Buttocks, Right upper leg, Left upper leg, Right lower leg, Left lower leg, Back    Bathing assist Assist Level: More than reasonable time      Upper Body Dressing/Undressing Upper body dressing   What is the patient wearing?: Button up shirt, Pull over shirt/dress     Pull over shirt/dress - Perfomed by patient: Thread/unthread right sleeve, Thread/unthread left sleeve, Put head through opening, Pull shirt over trunk   Button up shirt - Perfomed by patient: Thread/unthread right sleeve, Pull shirt around back, Button/unbutton shirt, Thread/unthread left sleeve Button up shirt - Perfomed by helper: Thread/unthread left sleeve    Upper body assist Assist Level: More than reasonable time   Set up : To obtain clothing/put away  Lower Body Dressing/Undressing Lower body dressing   What is the patient wearing?: Underwear, Pants, [employer], Shoes Underwear - Performed by patient: Thread/unthread right underwear leg, Thread/unthread left underwear leg, Pull underwear up/down   Pants- Performed by patient: Thread/unthread right pants leg, Thread/unthread left pants leg, Pull  pants up/down, Fasten/unfasten pants Pants- Performed by helper: Thread/unthread right pants leg, Thread/unthread left pants leg (wife assisting)         Shoes - Performed by patient: Don/doff right shoe, Don/doff left shoe, Fasten right, Fasten left         TED Hose - Performed by helper:  Don/doff right TED hose, Don/doff left TED hose  Lower body assist Assist for lower body dressing: More than reasonable time      Toileting Toileting   Toileting steps completed by patient: Adjust clothing prior to toileting, Performs perineal hygiene, Adjust clothing after toileting   Toileting Assistive Devices: Grab bar or rail  Toileting assist Assist level: More than reasonable time   Transfers Chair/bed transfer   Chair/bed transfer method: Ambulatory Chair/bed transfer assist level: Supervision or verbal cues Chair/bed transfer assistive device: Armrests, Medical sales representative     Max distance: 175 Assist level: Supervision or verbal cues   Wheelchair Wheelchair activity did not occur: N/A        Cognition Comprehension Comprehension assist level: Understands complex 90% of the time/cues 10% of the time  Expression Expression assist level: Expresses complex 90% of the time/cues < 10% of the time  Social Interaction Social Interaction assist level: Interacts appropriately with others - No medications needed.  Problem Solving Problem solving assist level: Solves complex 90% of the time/cues < 10% of the time  Memory Memory assist level: Recognizes or recalls 90% of the time/requires cueing < 10% of the time   Medical Problem List and Plan: 1. 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 DVT Prophylaxis/Anticoagulation: Subcutaneous Lovenox. Venous Doppler studies negative 3. Pain Management: Tylenol as needed 4. Hypertension/atrial fibrillation. Norvasc 10 mg daily. Cardiac rate controlled at present. Follow-up cardiology services. Patient currently refusing Eliquisand remains on aspirin  -resumed low dose hctz per home regimen (0nly 12.5mg ) 5.  Neuropsych: This patient iscapable of making decisions on hisown behalf. 6. Skin/Wound Care: Routine skin checks 7. Fluids/Electrolytes/Nutrition: labs pending -encourage PO 8.Hyperlipidemia. Pravachol 9. Urinary frequency/eterobacter UTI:   -voiding pattern approved  -OOB to void  -07/13/16---5 day course of bactrim   LOS (Days) 6 A FACE TO FACE EVALUATION WAS PERFORMED  Meredith Staggers, MD 07/15/2016 8:39 AM

## 2016-07-15 NOTE — Discharge Summary (Signed)
Discharge summary job # [id number]

## 2016-07-15 NOTE — Progress Notes (Addendum)
Occupational Therapy Session Note  Patient Details  Name: Jack [name] MRN: [id number] Date of Birth: [birth year]  Today's Date: 07/15/2016 OT Individual Time: [id number] OT Individual Time Calculation (min): 60 min     Short Term Goals:Week 1:  OT Short Term Goal 1 (Week 1): LTGs set a modified independent based on ELOS.  Skilled Therapeutic Interventions/Progress Updates:    Pt seen for OT session focusing on ADL re-training and fine motor coordination. Upon arrival, pt exiting shower with assist from wife. Educated them regarding need for staff assist with all mobility and assist from OT/PT for showering activities.  Pt dressed seated in w/c, he asks for unnecessary help from wife and required encouragement for independence.  HE completed grooming tasks standing at sink mod I, using B UEs in functional manner without LOB.  Pt ambulated to therapy gym with RW. He completed 9 hole peg test, see results below.  He then completed abdominal crunches on wedge mat addressing dynamic sitting balance. Pt with L LOB during dynamic sitting and with increased time and VCs, pt able to self correct. Pt then transferred onto physioball and addressed balance/ stability on ball. Initially required B UE support and occasional min steadying assist, however, progressed to supervision with dynamic movements seated on ball.  Pt ambulated back to room at end of session, left seated in chair with wife present and all needs in reach. Discussed with pt and wife recommendation for use ob BSC for showering task for more support due to pt's decreased sitting balance, both voiced agreement and understanding.  9 Hole peg test: R: 42 sec, 38.4 sec, and 39.6 sec L: 1 min 7 sec, 56 sec, and 1 min 6 sec   Therapy Documentation Precautions:  Precautions Precautions: Fall Restrictions Weight Bearing Restrictions: No Pain:   No/ denies pain  See Function Navigator for Current Functional Status.   Therapy/Group:  Individual Therapy  [name], Jack Oki C 07/15/2016, 7:06 AM

## 2016-07-15 NOTE — Discharge Instructions (Signed)
Inpatient Rehab Discharge Instructions  Jack [name] Discharge date and time: No discharge date for patient encounter.   Activities/Precautions/ Functional Status: Activity: activity as tolerated Diet: regular diet Wound Care: none needed Functional status:  ___ No restrictions     ___ Walk up steps independently ___ 24/7 supervision/assistance   ___ Walk up steps with assistance ___ Intermittent supervision/assistance  ___ Bathe/dress independently ___ Walk with walker     _x__ Bathe/dress with assistance ___ Walk Independently    ___ Shower independently ___ Walk with assistance    ___ Shower with assistance ___ No alcohol     ___ Return to work/school ________    COMMUNITY REFERRALS UPON DISCHARGE:    Outpatient: PT     OT                   Agency:  [facility]    Phone:  [phone]               Appointment Date/Time:  07/19/16 @ 9:30 - 11:00 (please arrive at 9:00am)       Special Instructions: No driving [place] Cigarette smoking nearly doubles your risk of having a stroke & is the single most alterable risk factor  If you smoke or have smoked in the last 12 months, you are advised to quit smoking for your health.  Most of the excess cardiovascular risk related to smoking disappears within a year of stopping.  Ask you doctor about anti-smoking medications  [place] Quit Line: 1-800-QUIT NOW  Free Smoking Cessation Classes [phone]  CHOLESTEROL Know your levels; limit fat & cholesterol in your diet  Lipid Panel     Component Value Date/Time   CHOL 128 07/04/2016 0220   TRIG 74 07/04/2016 0220   HDL 29 (L) 07/04/2016 0220   CHOLHDL 4.4 07/04/2016 0220   VLDL 15 07/04/2016 0220   LDLCALC 84 07/04/2016 0220      Many patients benefit from treatment even if their cholesterol is at goal.  Goal: Total Cholesterol (CHOL) less than 160  Goal:  Triglycerides (TRIG) less than 150  Goal:  HDL greater than 40  Goal:   LDL (LDLCALC) less than 100   BLOOD PRESSURE American Stroke Association blood pressure target is less that 120/80 mm/Hg  Your discharge blood pressure is:  BP: 120/66  Monitor your blood pressure  Limit your salt and alcohol intake  Many individuals will require more than one medication for high blood pressure  DIABETES (A1c is a blood sugar average for last 3 months) Goal HGBA1c is under 7% (HBGA1c is blood sugar average for last 3 months)  Diabetes: No known diagnosis of diabetes    Lab Results  Component Value Date   HGBA1C 5.5 07/04/2016     Your HGBA1c can be lowered with medications, healthy diet, and exercise.  Check your blood sugar as directed by your physician  Call your physician if you experience unexplained or low blood sugars.  PHYSICAL ACTIVITY/REHABILITATION Goal is 30 minutes at least 4 days per week  Activity: Increase activity slowly, Therapies: Physical Therapy: Home Health Return to work:   Activity decreases your risk of heart attack and stroke and makes your heart stronger.  It helps control your weight and blood pressure; helps you relax and can improve your mood.  Participate in a regular exercise program.  Talk with your doctor about the best form of exercise for you (dancing, walking, swimming, cycling).  DIET/WEIGHT Goal is  to maintain a healthy weight  Your discharge diet is: Diet Heart Room service appropriate? Yes; Fluid consistency: Thin  liquids Your height is:  Height: 5\' 4"  (162.6 cm) Your current weight is: Weight: 70 kg (154 lb 5.2 oz) Your Body Mass Index (BMI) is:  BMI (Calculated): 25  Following the type of diet specifically designed for you will help prevent another stroke.  Your goal weight range is:    Your goal Body Mass Index (BMI) is 19-24.  Healthy food habits can help reduce 3 risk factors for stroke:  High cholesterol, hypertension, and excess weight.  RESOURCES Stroke/Support Group:  Call [phone]   STROKE EDUCATION  PROVIDED/REVIEWED AND GIVEN TO PATIENT Stroke warning signs and symptoms How to activate emergency medical system (call 911). Medications prescribed at discharge. Need for follow-up after discharge. Personal risk factors for stroke. Pneumonia vaccine given:  Flu vaccine given:  My questions have been answered, the writing is legible, and I understand these instructions.  I will adhere to these goals & educational materials that have been provided to me after my discharge from the hospital.      My questions have been answered and I understand these instructions. I will adhere to these goals and the provided educational materials after my discharge from the hospital.  Patient/Caregiver Signature _______________________________ Date __________  Clinician Signature _______________________________________ Date __________  Please bring this form and your medication list with you to all your follow-up doctor's appointments.

## 2016-07-15 NOTE — Progress Notes (Signed)
Physical Therapy Discharge Summary  Patient Details  Name: Jack [name] MRN: [id number] Date of Birth: [birth year]  Today's Date: 07/15/2016    Patient has met 10 of 10 long term goals due to improved activity tolerance, improved balance, improved postural control, increased strength, ability to compensate for deficits, functional use of  right upper extremity and right lower extremity and improved coordination.  Patient to discharge at an ambulatory level Modified Independent; recommend supervision for community ambulation and stairs.   Patient's care partner is independent to provide the necessary physical assistance at discharge.  Reasons goals not met: All goals met  Recommendation:  Patient will benefit from ongoing skilled PT services in outpatient setting to continue to advance safe functional mobility, address ongoing impairments in coordination, strength, balance, activity tolerance, and minimize fall risk.  Equipment: No equipment provided  Reasons for discharge: treatment goals met and discharge from hospital  Patient/family agrees with progress made and goals achieved: Yes  PT Discharge Precautions/RestrictionsPrecautions Precautions: Fall Restrictions Weight Bearing Restrictions: No Vital Signs Therapy Vitals Temp: 98.3 F (36.8 C) Temp Source: Oral Pulse Rate: 75 Resp: 18 BP: (!) 113/41 Patient Position (if appropriate): Lying Oxygen Therapy SpO2: 95 % O2 Device: Not Delivered Pain Pain Assessment Pain Assessment: No/denies pain Vision/Perception    WFL; wears glasses at all times Cognition Overall Cognitive Status: History of cognitive impairments - at baseline Arousal/Alertness: Awake/alert Orientation Level: Oriented X4 Attention: Focused Focused Attention: Appears intact Memory: Impaired Memory Impairment: Decreased short term memory;Decreased recall of new information Decreased Short Term Memory: Functional complex Awareness: Appears  intact Safety/Judgment: Appears intact Sensation Sensation Light Touch: Impaired Detail (Pt reports "dull" sensation in entire L UE) Light Touch Impaired Details: Impaired LUE Proprioception: Impaired Detail Proprioception Impaired Details: Impaired RUE Additional Comments: Decreased proprioception in the right elbow with testing.  Coordination Gross Motor Movements are Fluid and Coordinated: No Fine Motor Movements are Fluid and Coordinated: No Coordination and Movement Description: Pt with history of left hemiparesis from previous CVA.  Decreased gross and FM coordination noted but uses functionally with selfcare tasks at a diminished level.   Motor  Motor Motor: Hemiplegia Motor - Discharge Observations: L hemiplegia, mild ataxia BLE  Mobility Bed Mobility Bed Mobility: Rolling Right;Rolling Left;Supine to Sit;Sit to Supine Rolling Right: 6: Modified independent (Device/Increase time) Rolling Left: 6: Modified independent (Device/Increase time) Supine to Sit: 6: Modified independent (Device/Increase time) Sit to Supine: 6: Modified independent (Device/Increase time) Transfers Transfers: Yes Sit to Stand: 6: Modified independent (Device/Increase time);With armrests Stand to Sit: 6: Modified independent (Device/Increase time);With armrests Stand Pivot Transfers: 6: Modified independent (Device/Increase time) Stand Pivot Transfer Details (indicate cue type and reason): with RW Locomotion  Ambulation Ambulation: Yes Ambulation/Gait Assistance: 6: Modified independent (Device/Increase time) Ambulation Distance (Feet): 200 Feet Assistive device: Rolling walker Gait Gait: Yes Gait Pattern: Impaired Gait Pattern: Left flexed knee in stance;Lateral hip instability;Left foot flat;Trunk flexed;Decreased stride length;Step-through pattern High Level Ambulation High Level Ambulation: Backwards walking Backwards Walking: min guard Stairs / Additional Locomotion Stairs: Yes Stairs  Assistance: 5: Supervision Stairs Assistance Details: Verbal cues for technique;Verbal cues for precautions/safety Stair Management Technique: One rail Right;Alternating pattern;Forwards Number of Stairs: 12 Height of Stairs: 6 Ramp: 5: Supervision Curb: 5: Supervision Wheelchair Mobility Wheelchair Mobility: No  Trunk/Postural Assessment  Cervical Assessment Cervical Assessment: Exceptions to [facility] (forward head posture) Thoracic Assessment Thoracic Assessment: Exceptions to [facility] (increased kyphosis) Lumbar Assessment Lumbar Assessment: Exceptions to [facility] (demonstrates posterior pelvic tilt as well lateral tilt to  the left. ) Postural Control Postural Control: Deficits on evaluation (delayed/inefficient stepping and ankle strategies)  Balance Balance Balance Assessed: Yes Standardized Balance Assessment Standardized Balance Assessment: Berg Balance Test;Timed Up and Go Test Berg Balance Test Sit to Stand: Able to stand without using hands and stabilize independently Standing Unsupported: Able to stand safely 2 minutes Sitting with Back Unsupported but Feet Supported on Floor or Stool: Able to sit safely and securely 2 minutes Stand to Sit: Sits safely with minimal use of hands Transfers: Able to transfer safely, minor use of hands Standing Unsupported with Eyes Closed: Able to stand 10 seconds safely Standing Ubsupported with Feet Together: Able to place feet together independently and stand for 1 minute with supervision From Standing, Reach Forward with Outstretched Arm: Can reach forward >12 cm safely (5") From Standing Position, Pick up Object from Floor: Able to pick up shoe, needs supervision From Standing Position, Turn to Look Behind Over each Shoulder: Looks behind from both sides and weight shifts well Turn 360 Degrees: Able to turn 360 degrees safely but slowly Standing Unsupported, Alternately Place Feet on Step/Stool: Able to complete >2 steps/needs minimal  assist Standing Unsupported, One Foot in Front: Able to take small step independently and hold 30 seconds Standing on One Leg: Tries to lift leg/unable to hold 3 seconds but remains standing independently Total Score: 43 Timed Up and Go Test TUG: Normal TUG Normal TUG (seconds): 25 Static Sitting Balance Static Sitting - Level of Assistance: 6: Modified independent (Device/Increase time) Dynamic Sitting Balance Dynamic Sitting - Level of Assistance: 6: Modified independent (Device/Increase time) Static Standing Balance Static Standing - Balance Support: Bilateral upper extremity supported Static Standing - Level of Assistance: 6: Modified independent (Device/Increase time) Static Standing - Comment/# of Minutes: Standing at sink to complete grooming task Dynamic Standing Balance Dynamic Standing - Balance Support: Bilateral upper extremity supported Dynamic Standing - Level of Assistance: 6: Modified independent (Device/Increase time) Dynamic Standing - Comments: Standing to complete grooming and LB dressing tasks Extremity Assessment  RUE Assessment RUE Assessment: Within Functional Limits (4/5 throughout) LUE Assessment LUE Assessment: Exceptions to [facility] (3+5/ shoulder flexion; 4/5 all other joints) RLE Assessment RLE Assessment: Within Functional Limits (grossly 4+/5 to 5/5 throughout) LLE Assessment LLE Assessment: Exceptions to [facility] (limited hip ROM; strength grossly 4+/5 to 5/5 throughout)   See Function Navigator for Current Functional Status.  Benjiman Core Tygielski 07/15/2016, 7:18 AM

## 2016-07-15 NOTE — Patient Care Conference (Signed)
Inpatient RehabilitationTeam Conference and Plan of Care Update Date: 07/13/2016   Time: 2:10 pm    Patient Name: Jack [name]      Medical Record Number: [id number]  Date of Birth: Mar 20, 1934 Sex: Male         Room/Bed: 4W09C/4W09C-01 Payor Info: Payor: [place] / Plan: [facility] MEDICARE / Product Type: *No Product type* /    Admitting Diagnosis: CVA  Admit Date/Time:  07/09/2016  2:18 PM Admission Comments: No comment available   Primary Diagnosis:  Subcortical infarction [facility]) Principal Problem: Subcortical infarction [facility])  Patient Active Problem List   Diagnosis Date Noted  . Subcortical infarction ([employer]) 07/09/2016  . PFO (patent foramen ovale)   . History of CVA with residual deficit   . Benign essential HTN   . Non compliance with medical treatment   . Hypokalemia   . Acute blood loss anemia   . Acute CVA (cerebrovascular accident) ([place]) 07/05/2016  . 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 TIA (transient ischemic attack) 07/03/2016  . Right sided weakness 07/03/2016  . Microscopic hematuria 04/03/2015  . Hyperlipidemia 04/03/2015  . Wears hearing aid 04/03/2015  . Changing skin lesion 04/03/2015  . Paresthesia and pain of left extremity 04/02/2015  . Encounter for health maintenance examination in adult 04/02/2015  . Vaccine counseling 04/02/2015  . Hernia of abdominal cavity 04/02/2015  . Impaired gait and mobility 04/02/2015  . Leg swelling 03/03/2015  . Essential hypertension 03/03/2015  . Spastic hemiplegia affecting nondominant side ([place]) 12/14/2013  . ICH (intracerebral hemorrhage) ([place]) 08/02/2013  . Hx of mitral valve repair   . History of stroke   . History of atrial fibrillation   . Cyst and pseudocyst of pancreas 09/13/2007  . PVC (premature ventricular contraction)   . History of renal cyst   . BPH (benign prostatic hypertrophy)     Expected Discharge Date: Expected Discharge Date: 07/17/16  Team Members  Present: Physician leading conference: Dr. Alger Simons Social Worker Present: Lennart Pall, LCSW Nurse Present: Rayetta Pigg, RN PT Present: Kem Parkinson, PT OT Present: Napoleon Form, OT SLP Present: Weston Anna, SLP PPS Coordinator present : Daiva Nakayama, RN, CRRN     Current Status/Progress Goal Weekly Team Focus  Medical   subcortical infarct with right hemiparesis. prior CVA. urinary urgency,UTI. BP controlled  increased balance/activity tolerance  urological mgt,rx UTI. bp/edema, stroke education   Bowel/Bladder   Continent of B/B; LBM 07/12/16  Supervision  Call for assistance before attempting to go to bathroom when wife or staff not in the room.   Swallow/Nutrition/ Hydration             ADL's   Supervision- min A overall  Mod I  Functional balance; activity tolerance; d/c planning   Mobility   modI bed mobility, S transfers and gait with RW, min guard stairs  modI bed mobility, transfers and gait in home, S car transfers, stairs and community gait  dynamic standing balance, activity tolerance, NMR   Communication             Safety/Cognition/ Behavioral Observations            Pain   No c/o pain  <2 on a 0-10 scale  Assess pain q 4hr. and prn   Skin   Right leg abrasion with tegaderm  Remain free from infection and breakdown while on rehab   assess skin q shift and prn    Rehab Goals Patient on target  to meet rehab goals: Yes *See Care Plan and progress notes for long and short-term goals.  Barriers to Discharge: previous CVA, poor hearing    Possible Resolutions to Barriers:  adaptive techniques and strategies, NMR, family ed    Discharge Planning/Teaching Needs:  Home with wife who can provide 24/7 support.  Teaching to take place as needed   Team Discussion:  Medically improved.  Supervision to min assist currently with mod ind - supervision goals.  No concerns.  Discussion of follow up services.  Revisions to Treatment Plan:  None   Continued  Need for Acute Rehabilitation Level of Care: The patient requires daily medical management by a physician with specialized training in physical medicine and rehabilitation for the following conditions: Daily direction of a multidisciplinary physical rehabilitation program to ensure safe treatment while eliciting the highest outcome that is of practical value to the patient.: Yes Daily medical management of patient stability for increased activity during participation in an intensive rehabilitation regime.: Yes Daily analysis of laboratory values and/or radiology reports with any subsequent need for medication adjustment of medical intervention for : Neurological problems;Blood pressure problems;Urological problems  Cecile Gillispie 07/15/2016, 11:15 AM

## 2016-07-15 NOTE — Progress Notes (Signed)
Social Work Patient ID: Jack [name], male   DOB: 1933-09-17, 83 y.o.   MRN: [id number]   Have reviewed team conference with pt and wife.  Both aware and agreeable with (revised) target d/c date of 1/5.  Also agreeable to recommendation of OP therapy as they have gone to [facility] in the past - follow up arranged.  Ready for d/c tomorrow.  Ellenora Talton, LCSW

## 2016-07-15 NOTE — Progress Notes (Signed)
Occupational Therapy Discharge Summary  Patient Details  Name: Jack [name] MRN: [id number] Date of Birth: 12/13/1933   Patient has met 67 of 11 long term goals due to improved activity tolerance, improved balance, postural control, functional use of  LEFT upper extremity and improved coordination.  Patient to discharge at overall Modified Independent level.  Patient discharging at overall mod I. Pt's wife has been present for most sessions and is able to provide assist when needed. Pt reports being at baseline level.    Recommendation:  Patient will benefit from ongoing skilled OT services in outpatient setting to continue to advance functional skills in the area of iADL.  Equipment: BSc with recommendation to use for shower chair  Reasons for discharge: treatment goals met and discharge from hospital  Patient/family agrees with progress made and goals achieved: Yes  OT Discharge Precautions/Restrictions  Precautions Precautions: Fall Restrictions Weight Bearing Restrictions: No Vision/Perception  Vision- History Baseline Vision/History: Wears glasses Wears Glasses: At all times Patient Visual Report: No change from baseline Vision- Assessment Vision Assessment?: Yes  Cognition Overall Cognitive Status: History of cognitive impairments - at baseline Arousal/Alertness: Awake/alert Orientation Level: Oriented X4 Attention: Focused Focused Attention: Appears intact Memory: Impaired Memory Impairment: Decreased short term memory;Decreased recall of new information Decreased Short Term Memory: Functional complex Awareness: Appears intact Problem Solving: Appears intact Safety/Judgment: Appears intact Sensation Sensation Light Touch: Impaired Detail (Pt reports "dull" sensation in entire L UE) Light Touch Impaired Details: Impaired LUE Proprioception: Impaired Detail Proprioception Impaired Details: Impaired RUE Additional Comments: Decreased proprioception in the right  elbow with testing.  Coordination Gross Motor Movements are Fluid and Coordinated: No Fine Motor Movements are Fluid and Coordinated: No Coordination and Movement Description: Pt with history of left hemiparesis from previous CVA.  Decreased gross and FM coordination noted but uses functionally with selfcare tasks at a diminished level.   9 Hole Peg Test: L: 1 min 7sec, 56sec, and 1 min 6 sec      R: 42 sec, 38.4 sec, and 39.6sec Motor  Motor Motor: Hemiplegia Motor - Discharge Observations: L hemiplegia, mild ataxia BLE Mobility  Bed Mobility Bed Mobility: Rolling Right;Rolling Left;Supine to Sit;Sit to Supine Rolling Right: 6: Modified independent (Device/Increase time) Rolling Left: 6: Modified independent (Device/Increase time) Supine to Sit: 6: Modified independent (Device/Increase time) Sit to Supine: 6: Modified independent (Device/Increase time) Transfers Sit to Stand: 6: Modified independent (Device/Increase time);With armrests Stand to Sit: 6: Modified independent (Device/Increase time);With armrests  Trunk/Postural Assessment  Cervical Assessment Cervical Assessment: Exceptions to [facility] Thoracic Assessment Thoracic Assessment: Exceptions to [facility] (Kyphotic) Lumbar Assessment Lumbar Assessment: Exceptions to [facility] (Posterior pelvic tilt; L lean) Postural Control Postural Control: Deficits on evaluation (L lean with dynamic sitting/standing tasks.)  Balance Balance Balance Assessed: Yes Standardized Balance Assessment Standardized Balance Assessment: Berg Balance Test Berg Balance Test Sit to Stand: Able to stand without using hands and stabilize independently Standing Unsupported: Able to stand safely 2 minutes Sitting with Back Unsupported but Feet Supported on Floor or Stool: Able to sit safely and securely 2 minutes Stand to Sit: Sits safely with minimal use of hands Transfers: Able to transfer safely, minor use of hands Standing Unsupported with Eyes Closed: Able to  stand 10 seconds with supervision Standing Ubsupported with Feet Together: Able to place feet together independently and stand for 1 minute with supervision From Standing, Reach Forward with Outstretched Arm: Can reach forward >12 cm safely (5") From Standing Position, Pick up Object from Floor: Able to  pick up shoe, needs supervision From Standing Position, Turn to Look Behind Over each Shoulder: Looks behind one side only/other side shows less weight shift Turn 360 Degrees: Able to turn 360 degrees safely but slowly Standing Unsupported, Alternately Place Feet on Step/Stool: Able to complete >2 steps/needs minimal assist Standing Unsupported, One Foot in Front: Able to take small step independently and hold 30 seconds Standing on One Leg: Tries to lift leg/unable to hold 3 seconds but remains standing independently Total Score: 41 Static Sitting Balance Static Sitting - Level of Assistance: 6: Modified independent (Device/Increase time) Dynamic Sitting Balance Dynamic Sitting - Level of Assistance: 6: Modified independent (Device/Increase time) Static Standing Balance Static Standing - Balance Support: During functional activity Static Standing - Level of Assistance: 6: Modified independent (Device/Increase time) Static Standing - Comment/# of Minutes: Standing at sink to complete grooming task Dynamic Standing Balance Dynamic Standing - Balance Support: During functional activity Dynamic Standing - Level of Assistance: 6: Modified independent (Device/Increase time) Dynamic Standing - Comments: Standing to complete grooming and LB dressing tasks Extremity/Trunk Assessment RUE Assessment RUE Assessment: Within Functional Limits (4/5 throughout) LUE Assessment LUE Assessment: Exceptions to [facility] (3+5/ shoulder flexion; 4/5 all other joints)   See Function Navigator for Current Functional Status.  Lewis, Lujain Kraszewski C 07/15/2016, 10:01 AM

## 2016-07-16 LAB — CREATININE, SERUM: Creatinine, Ser: 1.02 mg/dL (ref 0.61–1.24)

## 2016-07-16 MED ORDER — PRAVASTATIN SODIUM 40 MG PO TABS: 40.0000 mg | ORAL_TABLET | Freq: Every day | ORAL | 0 refills | Status: DC

## 2016-07-16 MED ORDER — AMLODIPINE BESYLATE 10 MG PO TABS: 10.0000 mg | ORAL_TABLET | Freq: Every day | ORAL | 3 refills | Status: DC

## 2016-07-16 MED ORDER — SULFAMETHOXAZOLE-TRIMETHOPRIM 400-80 MG PO TABS: 1.0000 | ORAL_TABLET | Freq: Two times a day (BID) | ORAL | 0 refills | Status: DC

## 2016-07-16 MED ORDER — HYDROCHLOROTHIAZIDE 12.5 MG PO CAPS: 12.5000 mg | ORAL_CAPSULE | Freq: Every day | ORAL | 0 refills | Status: DC

## 2016-07-16 NOTE — Discharge Summary (Signed)
NAME:  Jack [name], Jack [name]                      ACCOUNT NO.:  MEDICAL RECORD NO.:  [id number]  LOCATION:                                 FACILITY:  PHYSICIAN:  Meredith Staggers, M.D.DATE OF BIRTH:  1934-05-06  DATE OF ADMISSION:  07/09/2016 DATE OF DISCHARGE:  07/16/2016                              DISCHARGE SUMMARY   DISCHARGE DIAGNOSES: 1. Left high frontoparietal region infarct as well as history of     hemorrhagic cerebrovascular accident in the past. 2. Subcutaneous Lovenox for deep venous thrombosis prophylaxis. 3. Hypertension. 4. Atrial fibrillation. 5. Hyperlipidemia. 6. Urinary frequency with Enterobacter urinary tract infection.  HISTORY OF PRESENT ILLNESS:  This is an 83 year old, left-handed male, history of atrial fibrillation, maintained on aspirin; PFO; hemorrhagic CVA with spastic left hemiparesis 2 years ago.  Lives with spouse, independent with assistive device prior to admission.  Presented July 03, 2016, with acute onset of right-sided weakness.  MRI, MRA showed a small cortical subcortical infarct, left frontoparietal region; acute right occipital lobe infarct; chronic cerebral and cerebellar infarcts.  The patient did not receive tPA.  Carotid Dopplers without ICA stenosis.  Doppler studies of lower extremities, negative for DVT. Echocardiogram with ejection fraction of 65%.  The patient refused TEE. Neurology consulted, maintained on aspirin therapy.  Subcutaneous Lovenox for DVT prophylaxis.  Consideration for Eliquis secondary to cardioembolic stroke; however, the patient refused and remained on aspirin therapy.  Physical and occupational therapy ongoing.  The patient was admitted for comprehensive rehab program.  PAST MEDICAL HISTORY:  See discharge diagnoses.  SOCIAL HISTORY:  Lives with spouse, independent with assistive device.  FUNCTIONAL STATUS:  Upon admission to [facility], was minimal assist, 100 feet, rolling walker; minimal assist,  stand pivot transfers; min-to-mod assist, activities of daily living.  PHYSICAL EXAMINATION:  VITAL SIGNS:  Blood pressure 124/67, pulse 71, temperature 98, respirations 20. GENERAL:  This was an alert male, well developed. HEENT:  Normocephalic, EOMs intact. NECK:  Supple.  Nontender.  No JVD.  No tracheal deviation. CARDIAC:  Regular rate and rhythm.  Normal heart sounds. LUNGS:  Clear to auscultation.  No respiratory distress.  No wheezing. NEUROLOGIC:  Speech was slow, but appropriate, followed basic commands, fair awareness of deficits.  REHABILITATION HOSPITAL COURSE:  The patient was admitted to Inpatient Rehab Services with therapies initiated on a 3-hour daily basis consisting of physical therapy, occupational therapy and rehabilitation nursing.  The following issues were addressed during the patient's rehabilitation stay.  Pertaining to Mr. Mcewan left frontoparietal region infarct remained stable, maintained on aspirin therapy.  He did have a history of atrial fibrillation, remained on aspirin.  There was consideration for Eliquis; however, the patient refused and remained with aspirin therapy.  Subcutaneous Lovenox for DVT prophylaxis.  Venous Doppler studies negative.  Continued on hydrochlorothiazide for blood pressure control as well as Norvasc.  He was completing a course of Bactrim for Enterobacter UTI.  No dysuria noted.  The patient received weekly collaborative interdisciplinary team conferences to discuss estimated length of stay, family teaching, any barriers to discharge. Ambulating 175 feet x2, rolling walker.  Dynamic standing balance with toe taps weight  shifting.  Negotiated stairs with supervision.  He could gather his belongings for activities of daily living and homemaking. Ambulates to the gym for necessary ADL skills.  Full family teaching was completed and plan discharge to home.  DISCHARGE MEDICATIONS:  Included: 1. Norvasc 10 mg p.o. daily. 2.  Aspirin 325 mg p.o. daily. 3. Hydrochlorothiazide 12.5 mg p.o. daily. 4. Pravachol 40 mg daily. 5. Tylenol as needed.  DIET:  His diet was regular.  The patient would follow up with Dr. Alger Simons at the Outpatient Rehab Service as directed; Dr. Antony Contras, Neurology Service, call for appointment; Dr. Chana Bode, Medical Management; Dr. Thompson Grayer, Cardiology Service, call for appointment.     Lauraine Rinne, P.A.   ______________________________ Meredith Staggers, M.D.    DA/MEDQ  D:  07/15/2016  T:  07/16/2016  Job:  [id number]  cc:   Pramod P. Leonie Man, MD Chana Bode, PA

## 2016-07-16 NOTE — Progress Notes (Signed)
[facility] PHYSICAL MEDICINE & REHABILITATION     PROGRESS NOTE    Subjective/Complaints: No new complaints.   ROS: pt denies nausea, vomiting, diarrhea, cough, shortness of breath or chest pain     Objective: Vital Signs: Blood pressure 128/73, pulse 79, temperature 98.1 F (36.7 C), temperature source Oral, resp. rate 18, height 5\' 4"  (1.626 m), weight 64.5 kg (142 lb 3.2 oz), SpO2 100 %. No results found. No results for input(s): WBC, HGB, HCT, PLT in the last 72 hours.  Recent Labs  07/16/16 0549  CREATININE 1.02   CBG (last 3)  No results for input(s): GLUCAP in the last 72 hours.  Wt Readings from Last 3 Encounters:  07/15/16 64.5 kg (142 lb 3.2 oz)  07/07/16 66.9 kg (147 lb 7.8 oz)  04/02/15 66.4 kg (146 lb 6.4 oz)    Physical Exam:  Constitutional: He appears well-developed.  HENT:  Head: Normocephalicand atraumatic.  Eyes: EOMare normal.  Neck: Normal range of motion. Neck supple. No JVDpresent. No tracheal deviationpresent. No thyromegalypresent.  Cardiovascular: RRR Respiratory: CTA no wheezes or rales   GI: Soft. Bowel sounds are normal. He exhibits no distension. Bruising over LLQ  Musculoskeletal: He exhibits no edema.  Skin: Skin is warmand dry.  Musculoskeletal: He exhibits no current LE edemaor tenderness.  Neurological: He is alertand oriented to person, place, and time.  HOH.   Able to follow basic motor commands.  Sensation remains diminished to light touch LUE at baseline  Motor: RUE: 4/5 deltoid, bicep, tricep, wrist, HI. RLE: 4- HF, 4/5 KE and ADF/PF   LUE: 4-/5 deltoid, bicep, tricep, wrist, HI. LLE: 4- HF,KE and ADF/PF --stable  DTRs 3+ LUE/LLE Skin: Skin is warmand dry.  Psychiatric: His mood is pleasant  Assessment/Plan: 1. Right hemiplegia/gait disorder secondary to left high fronto-parietal infarct which require 3+ hours per day of interdisciplinary therapy in a comprehensive inpatient rehab setting. Physiatrist is  providing close team supervision and 24 hour management of active medical problems listed below. Physiatrist and rehab team continue to assess barriers to discharge/monitor patient progress toward functional and medical goals.  Function:  Bathing Bathing position   Position: Shower  Bathing parts Body parts bathed by patient: Right arm, Left arm, Chest, Abdomen, Front perineal area, Buttocks, Right upper leg, Left upper leg, Right lower leg, Left lower leg, Back    Bathing assist Assist Level: More than reasonable time      Upper Body Dressing/Undressing Upper body dressing   What is the patient wearing?: Button up shirt, Pull over shirt/dress     Pull over shirt/dress - Perfomed by patient: Thread/unthread right sleeve, Thread/unthread left sleeve, Put head through opening, Pull shirt over trunk   Button up shirt - Perfomed by patient: Thread/unthread right sleeve, Pull shirt around back, Button/unbutton shirt, Thread/unthread left sleeve Button up shirt - Perfomed by helper: Thread/unthread left sleeve    Upper body assist Assist Level: More than reasonable time   Set up : To obtain clothing/put away  Lower Body Dressing/Undressing Lower body dressing   What is the patient wearing?: Underwear, Pants, [employer], Shoes Underwear - Performed by patient: Thread/unthread right underwear leg, Thread/unthread left underwear leg, Pull underwear up/down   Pants- Performed by patient: Thread/unthread right pants leg, Thread/unthread left pants leg, Pull pants up/down, Fasten/unfasten pants Pants- Performed by helper: Thread/unthread right pants leg, Thread/unthread left pants leg (wife assisting)         Shoes - Performed by patient: Don/doff right shoe, Don/doff  left shoe, Fasten right, Fasten left         TED Hose - Performed by helper: Don/doff right TED hose, Don/doff left TED hose  Lower body assist Assist for lower body dressing: More than reasonable time       Toileting Toileting   Toileting steps completed by patient: Adjust clothing prior to toileting, Performs perineal hygiene, Adjust clothing after toileting   Toileting Assistive Devices: Grab bar or rail  Toileting assist Assist level: More than reasonable time   Transfers Chair/bed transfer   Chair/bed transfer method: Ambulatory Chair/bed transfer assist level: No Help, no cues, assistive device, takes more than a reasonable amount of time Chair/bed transfer assistive device: Armrests, Medical sales representative     Max distance: 250 Assist level: No help, No cues, assistive device, takes more than a reasonable amount of time   Wheelchair Wheelchair activity did not occur: N/A        Cognition Comprehension Comprehension assist level: Understands complex 90% of the time/cues 10% of the time  Expression Expression assist level: Expresses complex 90% of the time/cues < 10% of the time  Social Interaction Social Interaction assist level: Interacts appropriately with others - No medications needed.  Problem Solving Problem solving assist level: Solves complex 90% of the time/cues < 10% of the time  Memory Memory assist level: Recognizes or recalls 90% of the time/requires cueing < 10% of the time   Medical Problem List and Plan: 1. New right hemiplegia gait disordersecondary to acute cortical/subcortical infarct left high frontoparietal region as well as history of hemorrhagic CVA in the past with chronic spastic left hemiparesis -continue therapy PT,OT, SLP  -Patient to see me in the office for transitional care encounter in 1-2 weeks. 2. DVT Prophylaxis/Anticoagulation: Subcutaneous Lovenox. Venous Doppler studies negative 3. Pain Management: Tylenol as needed 4. Hypertension/atrial fibrillation. Norvasc 10 mg daily. Cardiac rate controlled at present. Follow-up cardiology services. Patient currently refusing Eliquisand remains on aspirin  -continue low  dose hctz per home regimen (0nly 12.5mg ) 5. Neuropsych: This patient iscapable of making decisions on hisown behalf. 6. Skin/Wound Care: Routine skin checks 7. Fluids/Electrolytes/Nutrition: labs pending -encourage PO 8.Hyperlipidemia. Pravachol 9. Urinary frequency/eterobacter UTI:   -voiding pattern approved  -OOB to void  -07/13/16---5 day course of bactrim   LOS (Days) 7 A FACE TO FACE EVALUATION WAS PERFORMED  Meredith Staggers, MD 07/16/2016 9:56 AM

## 2016-07-16 NOTE — Progress Notes (Signed)
Discharged to home accompanied by wife. Equipment cane and BSC taken with patient. Denied questions. Taken out via w./c. Margarito Liner

## 2016-07-16 NOTE — Progress Notes (Signed)
Social Work  Discharge Note  The overall goal for the admission was met for:   Discharge location: Yes - home with wife who can provide 24/7 supervision  Length of Stay: Yes - 7 days  Discharge activity level: Yes - modified independent  Home/community participation: Yes  Services provided included: MD, RD, PT, OT, RN, TR, Pharmacy and [place]: [facility]  Follow-up services arranged: Outpatient: PT, OT @ [facility], DME: 3n1 commode via [place] and Patient/Family has no preference for HH/DME agencies  Comments (or additional information):  Patient/Family verbalized understanding of follow-up arrangements: Yes  Individual responsible for coordination of the follow-up plan: pt  Confirmed correct DME delivered: Annibelle Brazie 07/16/2016    Branda Chaudhary

## 2016-07-19 ENCOUNTER — Ambulatory Visit: Payer: Medicare Other | Attending: Physical Medicine & Rehabilitation | Admitting: Physical Therapy

## 2016-07-19 ENCOUNTER — Telehealth: Payer: Self-pay

## 2016-07-19 ENCOUNTER — Ambulatory Visit: Payer: Medicare Other | Admitting: Occupational Therapy

## 2016-07-19 ENCOUNTER — Encounter: Payer: Self-pay | Admitting: Occupational Therapy

## 2016-07-19 VITALS — BP 121/68 | HR 96

## 2016-07-19 DIAGNOSIS — I69915 Cognitive social or emotional deficit following unspecified cerebrovascular disease: Secondary | ICD-10-CM | POA: Diagnosis present

## 2016-07-19 DIAGNOSIS — R278 Other lack of coordination: Secondary | ICD-10-CM

## 2016-07-19 DIAGNOSIS — R2689 Other abnormalities of gait and mobility: Secondary | ICD-10-CM | POA: Insufficient documentation

## 2016-07-19 DIAGNOSIS — R2681 Unsteadiness on feet: Secondary | ICD-10-CM

## 2016-07-19 DIAGNOSIS — R208 Other disturbances of skin sensation: Secondary | ICD-10-CM | POA: Diagnosis present

## 2016-07-19 DIAGNOSIS — I69353 Hemiplegia and hemiparesis following cerebral infarction affecting right non-dominant side: Secondary | ICD-10-CM | POA: Diagnosis present

## 2016-07-19 DIAGNOSIS — M6281 Muscle weakness (generalized): Secondary | ICD-10-CM | POA: Insufficient documentation

## 2016-07-19 DIAGNOSIS — I629 Nontraumatic intracranial hemorrhage, unspecified: Secondary | ICD-10-CM

## 2016-07-19 DIAGNOSIS — I69252 Hemiplegia and hemiparesis following other nontraumatic intracranial hemorrhage affecting left dominant side: Secondary | ICD-10-CM | POA: Insufficient documentation

## 2016-07-19 DIAGNOSIS — G8112 Spastic hemiplegia affecting left dominant side: Secondary | ICD-10-CM

## 2016-07-19 NOTE — Therapy (Signed)
[facility] [address] [place], [place], [address] Phone: [phone]   Fax:  [phone]  Occupational Therapy Evaluation  Patient Details  Name: Jack [name] MRN: [id number] Date of Birth: September 18, 1933 Referring Provider: Dr Alger Simons  Encounter Date: 07/19/2016      OT End of Session - 07/19/16 1204    Visit Number 1   Number of Visits 9   Date for OT Re-Evaluation 08/23/16   OT Start Time 1018   OT Stop Time 1106   OT Time Calculation (min) 48 min   Activity Tolerance Patient tolerated treatment well   Behavior During Therapy [facility] for tasks assessed/performed      Past Medical History:  Diagnosis Date  . Atrial fibrillation ([place])    Dr. Tollie Eth  . BPH (benign prostatic hypertrophy) 2012   prior therapy with finasteride, but as of 03/2015 no c/o; Dr. Luanne Bras  . Cellulitis of face 11/08   [facility]  . CVA (cerebral infarction) 2009, 2015  . Echocardiogram abnormal 02/11/2010   LVEF 70%, severe mitral regurgitation, pulm HTN   . ED (erectile dysfunction)    Viagra prn, consult with Urology 2012  . Elevated PSA 2012   BPH; Dr. Luanne Bras, [facility]; no more PSAs needed as of 2012 consult  . Gait abnormality    uses cane, s/p CVA  . Hemiparesis affecting left side as late effect of cerebrovascular accident ([place])   . Hiatal hernia    per 2008 EUS  . Hyperlipidemia   . Hypertension   . Microscopic hematuria    chronic, eval 2012 with Urology, benign at that time  . Obstructive uropathy 2012   Dr. Luanne Bras  . Pancreatic mass 01/2006   inconsequential per CT 2007  . 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 Patent foramen ovale    hx/o, notation of fistula on echocardiogram from 2009  . Prophylactic antibiotic 2016   requires due to valve disease  . Schatzki's ring 2008   per endoscopic ultrasound; Dr. Janetta Hora. Herbie Baltimore  Buccini  . Stroke ([place])   . Ventral hernia    surgery consult 01/2010 with Dr. Excell Seltzer, elective repair if desired , but he declined  . Wears hearing aid     Past Surgical History:  Procedure Laterality Date  . CARDIAC CATHETERIZATION  02/2010   LVEF 70%, severe mitral regurgitation, pulm HTN; prior in 01/2006  . INGUINAL HERNIA REPAIR     bilat  . MITRAL VALVE REPAIR  01/2006   mitral repair with Maze procedure, tricuspid annuloplasty, MAZE and LAA oversewn by Dr. Roxy Manns    Vitals:   07/19/16 1025  BP: 121/68  Pulse: 96        Subjective Assessment - 07/19/16 1026    Subjective  They will come along - regarding his arms.  Patient concerned about being able to pick up his left leg   Patient is accompained by: Family member   Pertinent History Hemorrhagic stroke 2 years ago left spastic hemiplegia   Currently in Pain? No/denies   Pain Score 0-No pain           [facility] OT Assessment - 07/19/16 1008      Assessment   Diagnosis Left frontoparietal infarct   Referring Provider Dr Alger Simons   Onset Date 07/03/16   Prior Therapy [facility] 12/29-07/16/16     Precautions   Precautions Fall  Restrictions   Weight Bearing Restrictions No     Balance Screen   Has the patient fallen in the past 6 months No   Has the patient had a decrease in activity level because of a fear of falling?  Yes   Is the patient reluctant to leave their home because of a fear of falling?  No  Questions patients awareness     Home  Environment   Family/patient expects to be discharged to: Private residence   Living Arrangements Spouse/significant other   Available Help at Discharge Family   Type of [place] --  split level   Bathroom Shower/Tub Tub/Shower unit;Curtain;Gaffer;Door   Home Equipment Bedside commode   Lives With Spouse     Prior Function   Level of Independence Requires assistive device for  independence;Independent with basic ADLs   Vocation Retired   [employer], [employer]     ADL   Eating/Feeding Minimal assistance   Eating/Feeding wife assists with cutting food   Grooming Modified independent   Grooming details electric toothbrush and razor   Upper Body Bathing Modified independent   Lower Body Bathing Supervision/safety   Upper Body Dressing Supervision/safety  if standing   Upper Body Dressing Details posterior bias in standing   Where Assessed-Upper Body Dressing Unsupported standing   Lower Body Dressing Supervision/safety   Lower Body Dressing Details poor dynamic standing balance   Toilet Tranfer Minimal assistance   Toilet Transfer Details (indicate cue type and reason walking with cane   Toileting - Water quality scientist --  supervision   Toileting -  Air traffic controller Details (indicate cue type and reason Special educational needs teacher Other (comment)  3 n 1   Transfers/Ambulation Related to ADL's Patient sent home with walker, but using cane - unsafe   ADL comments Patient and wife both report that patient dresses himself since home from hospital, however dynamic standing balance is poor, and patient at high risk for falls.  Spoke with wife and recommended use of walker, and close supervision with  walking in the home     IADL   Prior Level of Function Shopping modified independent - used cart for balance, went with wife   Shopping Needs to be accompanied on any shopping trip;Completely unable to shop   Prior Level of Function Light Housekeeping Modified independent   Light Housekeeping Does not participate in any housekeeping tasks   Prior Level of Function Community Mobility walked with cane   Community Mobility Relies on family or friends for transportation   Prior Level of Function Meal Prep Independent   Medication Management --  supervision     Mobility    Mobility Status Needs assist   Mobility Status Comments Patient unsafe with cane today, frequently catching left foot     Written Expression   Dominant Hand Left   Handwriting Increased time;75% legible     Vision - History   Baseline Vision Wears glasses all the time   Additional Comments Patient reports no visual changes     Vision Assessment   Eye Alignment Within Functional Limits     Activity Tolerance   Activity Tolerance --  Endurance is limiting participation in ADL/IADL     Cognition   Overall Cognitive Status Cognition to be further assessed in functional context PRN   Attention Sustained   Sustained Attention Appears intact  Memory Impaired   Memory Impairment Retrieval deficit;Storage deficit   Awareness Impaired   Awareness Impairment Intellectual impairment   Problem Solving Impaired   Executive Function Initiating;Decision Making;Self Monitoring   Decision Making Impaired   Decision Making Impairment Functional basic;Functional complex   Initiating Impaired   Initiating Impairment Functional basic;Functional complex     Observation/Other Assessments   Observations skilled judgement, 9 hole peg, box and blocks   Focus on Therapeutic Outcomes (FOTO)  IS physical-Hand function 45%     Sensation   Light Touch Impaired by gross assessment  left hand numbness   Stereognosis Impaired by gross assessment   Hot/Cold Not tested   Proprioception Impaired by gross assessment  left arm is tingling   Additional Comments Patient with significant sensory loss in left UE from prior stroke     Coordination   Gross Motor Movements are Fluid and Coordinated No   Fine Motor Movements are Fluid and Coordinated No   Coordination and Movement Description Poorly graded movement L>R   Finger Nose Finger Test Impaired moreso left hand, much compensation in trunk due to proximal weakness left   9 Hole Peg Test Right;Left   Right 9 Hole Peg Test 36.78   Left 9 Hole Peg Test  53.06   Box and Blocks r.32, l.32     Perception   Perception Impaired   Spatial Orientation Needs further assessment - strong posterior bias with standign, walking     Praxis   Praxis Not tested     Tone   Assessment Location Left Upper Extremity;Right Upper Extremity     ROM / Strength   AROM / PROM / Strength AROM;Strength     AROM   Overall AROM  Deficits   Overall AROM Comments Limited end range L shoulder flexion, limited supination, wrist extension     Strength   Overall Strength Deficits   Overall Strength Comments Not formally assessed due to abnormal tone - but patient unable to sustain L shoulder flexion at 90 degrees for more than a moment - drifting downward without his notice     RUE Tone   RUE Tone Hypotonic;Mild     LUE Tone   LUE Tone Hypertonic;Moderate  elbow, shoulder     LUE Tone   Hypertonic Details elbow, shoulder                         OT Education - 07/19/16 1202    Education provided Yes   Education Details Recommended that patient use walker in his home (vs cane) as patient with poor balance, and high risk for falls   Person(s) Educated Patient;Spouse   Methods Explanation;Demonstration   Comprehension Verbalized understanding;Need further instruction             OT Long Term Goals - 07/19/16 1213      OT [place] #1   Title Patient will safely complete shower stall transfer with modified independence   Time 4   Period Weeks   Status New     OT LONG TERM GOAL #2   Title Patient will don/doff jacket/ front openiong shirt in standing without assistance   Time 4   Period Weeks   Status New     OT LONG TERM GOAL #3   Title Patient will cut his own food during meals with modified independence   Time 4   Period Weeks   Status New     OT LONG  TERM GOAL #4   Title Patient will demonstrate sufficient activity tolerance to grocery shop with wife using a cart, and locating 30% needed items   Time 4    Period Weeks   Status New     OT LONG TERM GOAL #5   Title Patient will return to simple housekeeping with modified independence   Time 4   Period Weeks   Status New               Plan - 07/19/16 1205    Clinical Impression Statement Patient is an 83 year old male admitted to [facility] OT for rehabilitation following left frontoparietal infarct on 07/03/16.  Patient recently discharged from in patient rehab 07/16/16.  Patient with past medical history significant for prior hemorrhagic stroke and spastic hemiparesis (2 years ago) , Hypertension, and Afib.  Patient currently requires supervision to minimal assistance forbasic ADL's and is currently unable to participate in IADL/ leisure interests due to decreased balance, decreased activity tolerance, decreased coordiantion, decreased Active range of motion, decreased awareness/ attention/ problem solving, diminished sensation, and generalized weakness.  Patient will benefit from skilled OT intervention to help him return to previous level of functional activity / safety.     Rehab Potential Good   OT Frequency 2x / week   OT Duration 4 weeks   OT Treatment/Interventions Self-care/ADL training;Therapeutic exercise;Neuromuscular education;DME and/or AE instruction;Manual Therapy;Therapist, nutritional;Therapeutic activities;Cognitive remediation/compensation;Visual/perceptual remediation/compensation;Patient/family education;Balance training   Plan  MOCA, Dynamic stand balance with functional activity, coordination HEP - L/R,   Consulted and Agree with Plan of Care Patient;Family member/caregiver      Patient will benefit from skilled therapeutic intervention in order to improve the following deficits and impairments:  Decreased activity tolerance, Decreased balance, Decreased cognition, Decreased coordination, Decreased safety awareness, Impaired UE functional use, Impaired tone, Decreased range of motion, Difficulty walking, Impaired  sensation, Impaired flexibility, Decreased strength, Impaired perceived functional ability, Improper body mechanics, Impaired vision/preception, Decreased endurance, Decreased knowledge of precautions, Decreased knowledge of use of DME, Decreased mobility  Visit Diagnosis: Unsteadiness on feet - Plan: Ot plan of care cert/re-cert  Hemiplegia and hemiparesis following cerebral infarction affecting right non-dominant side ([place]) - Plan: Ot plan of care cert/re-cert  Muscle weakness (generalized) - Plan: Ot plan of care cert/re-cert  Cognitive social or emotional deficit following unspecified cerebrovascular disease - Plan: Ot plan of care cert/re-cert  Other lack of coordination - Plan: Ot plan of care cert/re-cert  Other disturbances of skin sensation - Plan: Ot plan of care cert/re-cert  Spastic hemiplegia of left dominant side due to other nontraumatic intracranial hemorrhage ([place]) - Plan: Ot plan of care cert/re-cert      G-Codes - 0000000 1220    Functional Assessment Tool Used clinical judgement, 9 hole peg   Functional Limitation Carrying, moving and handling objects   Carrying, Moving and Handling Objects Current Status [id number]) At least 40 percent but less than 60 percent impaired, limited or restricted   Carrying, Moving and Handling Objects Goal Status [id number]) At least 20 percent but less than 40 percent impaired, limited or restricted      Problem List Patient Active Problem List   Diagnosis Date Noted  . Subcortical infarction ([place]) 07/09/2016  . PFO (patent foramen ovale)   . History of CVA with residual deficit   . Benign essential HTN   . Non compliance with medical treatment   . Hypokalemia   . Acute blood loss anemia   . Acute CVA (cerebrovascular accident) (  [place]) 07/05/2016  . Embolic stroke involving left middle cerebral artery ([place])   . TIA (transient ischemic attack) 07/03/2016  . Right sided weakness 07/03/2016  . Microscopic hematuria 04/03/2015  .  Hyperlipidemia 04/03/2015  . Wears hearing aid 04/03/2015  . Changing skin lesion 04/03/2015  . Paresthesia and pain of left extremity 04/02/2015  . Encounter for health maintenance examination in adult 04/02/2015  . Vaccine counseling 04/02/2015  . Hernia of abdominal cavity 04/02/2015  . Impaired gait and mobility 04/02/2015  . Leg swelling 03/03/2015  . Essential hypertension 03/03/2015  . Spastic hemiplegia affecting nondominant side ([place]) 12/14/2013  . ICH (intracerebral hemorrhage) ([place]) 08/02/2013  . Hx of mitral valve repair   . History of stroke   . History of atrial fibrillation   . Cyst and pseudocyst of pancreas 09/13/2007  . PVC (premature ventricular contraction)   . History of renal cyst   . BPH (benign prostatic hypertrophy)     Mariah Milling, OTR/L 07/19/2016, 12:25 PM  [facility] [address] [place], [place], [address] Phone: [phone]   Fax:  [phone]  Name: Jack [name] MRN: [id number] Date of Birth: Apr 09, 1934

## 2016-07-19 NOTE — Telephone Encounter (Signed)
Appointment time confirmed, No paperwork mailed   Transitional Care call-Wife  1. Are you/is patient experiencing any problems since coming home?No Are there any questions regarding any aspect of care? No 2. Are there any questions regarding medications administration/dosing? No Are meds being taken as prescribed? Yes Patient should review meds with caller to confirm 3. Have there been any falls? No 4. Has Home Health been to the house and/or have they contacted you? [facility] .If not, have you tried to contact them? Can we help you contact them? No 5. Are bowels and bladder emptying properly?Yes Are there any unexpected incontinence issues? If applicable, is patient following bowel/bladder programs? 6. Any fevers, problems with breathing, unexpected pain?  No 7. Are there any skin problems or new areas of breakdown? Yes 8. Has the patient/family member arranged specialty MD follow up (ie cardiology/neurology/renal/surgical/etc)? Yes Can we help arrange? 9. Does the patient need any other services or support that we can help arrange? No 10. Are caregivers following through as expected in assisting the patient? Yes 11. Has the patient quit smoking, drinking alcohol, or using drugs as recommended? Patient doesn't use tobacco products, illegal drugs or illicit drugs

## 2016-07-19 NOTE — Therapy (Signed)
[facility] [address] [place], [place], [address] Phone: [phone]   Fax:  [phone]  Physical Therapy Evaluation  Patient Details  Name: Jack [name] MRN: [id number] Date of Birth: [birth year] Referring Provider: Dr. Alger Simons  Encounter Date: 07/19/2016      PT End of Session - 07/19/16 1222    Visit Number 1   Number of Visits 17   Date for PT Re-Evaluation 09/17/16   Authorization Type [employer] Medicare-GCODE required every 10th visit   PT Start Time 0937   PT Stop Time 1019   PT Time Calculation (min) 42 min   Equipment Utilized During Treatment Gait belt   Activity Tolerance Patient tolerated treatment well   Behavior During Therapy [facility] for tasks assessed/performed      Past Medical History:  Diagnosis Date  . Atrial fibrillation ([place])    Dr. Tollie Eth  . BPH (benign prostatic hypertrophy) 2012   prior therapy with finasteride, but as of 03/2015 no c/o; Dr. Luanne Bras  . Cellulitis of face 11/08   [facility]  . CVA (cerebral infarction) 2009, 2015  . Echocardiogram abnormal 02/11/2010   LVEF 70%, severe mitral regurgitation, pulm HTN   . ED (erectile dysfunction)    Viagra prn, consult with Urology 2012  . Elevated PSA 2012   BPH; Dr. Luanne Bras, [facility]; no more PSAs needed as of 2012 consult  . Gait abnormality    uses cane, s/p CVA  . Hemiparesis affecting left side as late effect of cerebrovascular accident ([place])   . Hiatal hernia    per 2008 EUS  . Hyperlipidemia   . Hypertension   . Microscopic hematuria    chronic, eval 2012 with Urology, benign at that time  . Obstructive uropathy 2012   Dr. Luanne Bras  . Pancreatic mass 01/2006   inconsequential per CT 2007  . 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 Patent foramen ovale    hx/o, notation of fistula on echocardiogram from 2009  . Prophylactic antibiotic 2016    requires due to valve disease  . Schatzki's ring 2008   per endoscopic ultrasound; Dr. Janetta Hora. [name]  . Stroke ([place])   . Ventral hernia    surgery consult 01/2010 with Dr. Excell Seltzer, elective repair if desired , but he declined  . Wears hearing aid     Past Surgical History:  Procedure Laterality Date  . CARDIAC CATHETERIZATION  02/2010   LVEF 70%, severe mitral regurgitation, pulm HTN; prior in 01/2006  . INGUINAL HERNIA REPAIR     bilat  . MITRAL VALVE REPAIR  01/2006   mitral repair with Maze procedure, tricuspid annuloplasty, MAZE and LAA oversewn by Dr. Roxy Manns    There were no vitals filed for this visit.       Subjective Assessment - 07/19/16 0942    Subjective Patient reports recent stroke caused right sided deficits. Reports he feels right side has improved. Reports he uses RW or cane to ambulate (does not walk without a device). Reports he has LLE brace (from prior CVA) and does not wear it anymore because he got tired of it. Admits LLE drags at times.    Patient Stated Goals walking and balance to improve   Currently in Pain? No/denies            [facility] PT Assessment - 07/19/16 0947      Assessment   Medical Diagnosis Lt  frontoparietal CVA   Referring Provider Dr. Alger Simons   Onset Date/Surgical Date 07/03/16   Hand Dominance Left     Precautions   Precautions Fall     Balance Screen   Has the patient fallen in the past 6 months No   Has the patient had a decrease in activity level because of a fear of falling?  Yes   Is the patient reluctant to leave their home because of a fear of falling?  Yes     [place] Private residence   Living Arrangements Spouse/significant other   Available Help at Discharge Family   Type of [place] Access Level entry   [place] Two level   Alternate Level Stairs-Number of Steps 7  up to bedroom   Alternate [place]  - single point;Walker - 2 wheels;Tub bench;Bedside commode     Prior Function   Level of Independence Requires assistive device for independence  cane outdoors & night trips to bathroom   Leisure gym 6x/wk doing bicycle and a little treadmil     Cognition   Overall Cognitive Status Impaired/Different from baseline   Awareness Impairment --  Decreased safety awareness with gait     Observation/Other Assessments   Focus on Therapeutic Outcomes (FOTO)  FOTO intake score:  45; Stroke IS mobility score 47.2%     Sensation   Light Touch Impaired by gross assessment  decr on left (prior CVA)     Posture/Postural Control   Posture/Postural Control Postural limitations   Postural Limitations Forward head;Decreased lumbar lordosis;Rounded Shoulders     Tone   Assessment Location --     ROM / Strength   AROM / PROM / Strength Strength     Strength   Strength Assessment Site Hip;Knee;Ankle   Right/Left Hip Right;Left   Right Hip Flexion 4+/5   Left Hip Flexion 4/5   Right/Left Knee Right;Left   Right Knee Flexion 4/5   Right Knee Extension 4+/5   Left Knee Flexion 4/5   Left Knee Extension 4/5   Right/Left Ankle Right;Left   Right Ankle Dorsiflexion 3+/5   Right Ankle Plantar Flexion 2+/5   Left Ankle Dorsiflexion 3+/5     Transfers   Transfers Sit to Stand;Stand to Sit;Stand Pivot Transfers   Sit to Stand 6: Modified independent (Device/Increase time);Without upper extremity assist  incr time; no device   Stand to Sit 6: Modified independent (Device/Increase time);Without upper extremity assist  incr time; no device   Stand Pivot Transfers 6: Modified independent (Device/Increase time)  incr time; no device     Ambulation/Gait   Ambulation/Gait Yes   Ambulation/Gait Assistance 5: Supervision;4: Min guard   Ambulation/Gait Assistance Details Pt experienced one episode of L foot catching with gait during TUG.     Ambulation Distance (Feet) 80 Feet   Assistive device  Straight cane   Gait Pattern Step-through pattern;Decreased step length - right;Decreased dorsiflexion - left;Decreased step length - left;Decreased weight shift to left;Left foot flat;Wide base of support;Poor foot clearance - left  increased L foot internal rotation   Ambulation Surface Level;Indoor   Gait velocity 1.10 ft/sec; (29.86 sec, 32.8 ft  cane     Balance   Balance Assessed Yes     Standardized Balance Assessment   Standardized Balance Assessment Timed Up and Go Test;10 meter walk test;Berg Balance Test   10 Meter Walk 29.86     Merrilee Jansky  Balance Test   Sit to Stand Able to stand without using hands and stabilize independently   Standing Unsupported Able to stand safely 2 minutes   Sitting with Back Unsupported but Feet Supported on Floor or Stool Able to sit safely and securely 2 minutes   Stand to Sit Sits safely with minimal use of hands   Transfers Able to transfer safely, minor use of hands   Standing Unsupported with Eyes Closed Able to stand 10 seconds with supervision   Standing Ubsupported with Feet Together Able to place feet together independently and stand for 1 minute with supervision   From Standing, Reach Forward with Outstretched Arm Can reach forward >12 cm safely (5")   From Standing Position, Pick up Object from Floor Able to pick up shoe, needs supervision   From Standing Position, Turn to Look Behind Over each Shoulder Needs supervision when turning   Turn 360 Degrees Able to turn 360 degrees safely but slowly  Lt 10.9 Rt 10 sec   Standing Unsupported, Alternately Place Feet on Step/Stool Needs assistance to keep from falling or unable to try   Standing Unsupported, One Foot in Front Able to plae foot ahead of the other independently and hold 30 seconds   Standing on One Leg Unable to try or needs assist to prevent fall   Total Score 38   Berg comment: Scores <45/56 indicate increased fall risk     Timed Up and Go Test   Normal TUG (seconds) 28.5   TUG  Comments cane; one LOB with pt recovering                             PT Short Term Goals - 07/19/16 1555      PT SHORT TERM GOAL #1   Title Pt will perform HEP with family's supervision for improved strength, balance, and gait.  TARGET 08/18/16   Time 4   Period Weeks   Status New     PT SHORT TERM GOAL #2   Title Pt will improve TUG score to less than or equal to 25 seconds for decreased fall risk.   Time 4   Period Weeks   Status New     PT SHORT TERM GOAL #3   Title Pt will improve Berg Balance score to at least 45/56 for decreased fall risk.   Time 4   Period Weeks   Status New     PT SHORT TERM GOAL #4   Title Pt will ambulate at least 300 ft using appropriate assistive device (RW versus cane) modified independently, for improved safety with gait.   Time 4   Period Weeks   Status New     PT SHORT TERM GOAL #5   Title Pt will negotiate at least 7 steps with one handrail, modified independently for improved safety with stair negotiation in the home.   Time 4   Period Weeks   Status New     Additional Short Term Goals   Additional Short Term Goals Yes     PT SHORT TERM GOAL #6   Title Pt/family will verbalize understanding of CVA education, including warning signs and symptoms.   Time 4   Period Weeks   Status New           PT Long Term Goals - 07/19/16 1611      PT LONG TERM GOAL #1   Title Pt/family will verbalize understanding of fall  prevention in the home environment.  TARGET 09/17/16   Time 8   Period Weeks   Status New     PT LONG TERM GOAL #2   Title Pt will improve TUG score to less than or equal to 22 seconds for decreased fall risk.   Time 8   Period Weeks   Status New     PT LONG TERM GOAL #3   Title Pt will improve gait velocity to at least 1.8 ft/sec for improved gait efficiency and safety.   Time 8   Period Weeks   Status New     PT LONG TERM GOAL #4   Title Pt will ambulate at least 50-100 ft, household  distances, with cane, with supervision for improved household mobility.   Time 8   Period Weeks   Status New     PT LONG TERM GOAL #5   Title Pt will report at least 20% improvement in STroke IS mobiltiy scale on FOTO to demonstrate improved functional mobility.   Time 8   Period Weeks   Status New               Plan - 08-08-16 1548    Clinical Impression Statement PT is an 83 year old male with history of CVA (L sided weakness), mitral valve repair, A-fib, who presents to OP PT with frontoparietal CVA with R hemiplegia.  He presents with decreased muscle strength, decreased balance, decreased independence and safety with gait and mobility.  Pt is at fall risk per Berg, TUG and gait velocity scores.  Prior to CVA, pt was pariticpating in gym activities for exercises and was amulatory with cane in community and no device at home.  Pt would benefit from skilled physical therapy to address the above stated deficits to improve functional mobility and decrease fall risk.   Rehab Potential Good   PT Frequency 2x / week   PT Duration 8 weeks  plus eval   PT Treatment/Interventions ADLs/Self Care Home Management;Functional mobility training;Gait training;Stair training;Neuromuscular re-education;Patient/family education;Balance training;Therapeutic exercise;Therapeutic activities;Orthotic Fit/Training   PT Next Visit Plan Initiate HEP to address balance and strength; gait training (request pt to bring L AFO) with RW vs cane and discuss safety with gait   Consulted and Agree with Plan of Care Patient      Patient will benefit from skilled therapeutic intervention in order to improve the following deficits and impairments:  Abnormal gait, Decreased balance, Decreased mobility, Decreased safety awareness, Decreased strength, Difficulty walking  Visit Diagnosis: Other abnormalities of gait and mobility  Unsteadiness on feet  Muscle weakness (generalized)      G-Codes - August 08, 2016 1615     Functional Assessment Tool Used Berg 38/56, TUG 28.50 sec, gait velocity 1.10 ft/sec, FOTO:  Stroke IS scale 47.2%, Functional intake status measure 45   Functional Limitation Mobility: Walking and moving around   Mobility: Walking and Moving Around Current Status [phone]) At least 40 percent but less than 60 percent impaired, limited or restricted   Mobility: Walking and Moving Around Goal Status [phone]) At least 20 percent but less than 40 percent impaired, limited or restricted       Problem List Patient Active Problem List   Diagnosis Date Noted  . Subcortical infarction ([place]) 07/09/2016  . PFO (patent foramen ovale)   . History of CVA with residual deficit   . Benign essential HTN   . Non compliance with medical treatment   . Hypokalemia   . Acute blood loss  anemia   . Acute CVA (cerebrovascular accident) ([place]) 07/05/2016  . Embolic stroke involving left middle cerebral artery ([place])   . TIA (transient ischemic attack) 07/03/2016  . Right sided weakness 07/03/2016  . Microscopic hematuria 04/03/2015  . Hyperlipidemia 04/03/2015  . Wears hearing aid 04/03/2015  . Changing skin lesion 04/03/2015  . Paresthesia and pain of left extremity 04/02/2015  . Encounter for health maintenance examination in adult 04/02/2015  . Vaccine counseling 04/02/2015  . Hernia of abdominal cavity 04/02/2015  . Impaired gait and mobility 04/02/2015  . Leg swelling 03/03/2015  . Essential hypertension 03/03/2015  . Spastic hemiplegia affecting nondominant side ([place]) 12/14/2013  . ICH (intracerebral hemorrhage) ([place]) 08/02/2013  . Hx of mitral valve repair   . History of stroke   . History of atrial fibrillation   . Cyst and pseudocyst of pancreas 09/13/2007  . PVC (premature ventricular contraction)   . History of renal cyst   . BPH (benign prostatic hypertrophy)     D'Arcy Abraha W. 07/19/2016, 4:22 PM  Frazier Butt., PT  [facility] [address] [place], [place], [address] Phone: [phone]   Fax:  [phone]  Name: Jack [name] MRN: [id number] Date of Birth: Jan 26, 1934

## 2016-07-19 NOTE — Telephone Encounter (Signed)
First attempt transitional care call. Left message on voicemail to have patient to give [place] a call back to discuss his hospital discharge and upcoming appointment schedule w/ Dr. Naaman Plummer 07/20/2016

## 2016-07-20 ENCOUNTER — Encounter: Payer: Medicare Other | Attending: Physical Medicine & Rehabilitation | Admitting: Physical Medicine & Rehabilitation

## 2016-07-20 ENCOUNTER — Encounter: Payer: Self-pay | Admitting: Physical Medicine & Rehabilitation

## 2016-07-20 VITALS — BP 126/79 | HR 95 | Resp 14

## 2016-07-20 DIAGNOSIS — N400 Enlarged prostate without lower urinary tract symptoms: Secondary | ICD-10-CM | POA: Insufficient documentation

## 2016-07-20 DIAGNOSIS — K449 Diaphragmatic hernia without obstruction or gangrene: Secondary | ICD-10-CM | POA: Insufficient documentation

## 2016-07-20 DIAGNOSIS — Z5189 Encounter for other specified aftercare: Secondary | ICD-10-CM | POA: Diagnosis present

## 2016-07-20 DIAGNOSIS — I10 Essential (primary) hypertension: Secondary | ICD-10-CM | POA: Insufficient documentation

## 2016-07-20 DIAGNOSIS — R531 Weakness: Secondary | ICD-10-CM | POA: Diagnosis not present

## 2016-07-20 DIAGNOSIS — R3129 Other microscopic hematuria: Secondary | ICD-10-CM | POA: Diagnosis not present

## 2016-07-20 DIAGNOSIS — I6930 Unspecified sequelae of cerebral infarction: Secondary | ICD-10-CM

## 2016-07-20 DIAGNOSIS — N390 Urinary tract infection, site not specified: Secondary | ICD-10-CM | POA: Insufficient documentation

## 2016-07-20 DIAGNOSIS — I63412 Cerebral infarction due to embolism of left middle cerebral artery: Secondary | ICD-10-CM | POA: Diagnosis not present

## 2016-07-20 DIAGNOSIS — K222 Esophageal obstruction: Secondary | ICD-10-CM | POA: Diagnosis not present

## 2016-07-20 DIAGNOSIS — I4891 Unspecified atrial fibrillation: Secondary | ICD-10-CM | POA: Diagnosis not present

## 2016-07-20 DIAGNOSIS — I69354 Hemiplegia and hemiparesis following cerebral infarction affecting left non-dominant side: Secondary | ICD-10-CM | POA: Diagnosis present

## 2016-07-20 DIAGNOSIS — E785 Hyperlipidemia, unspecified: Secondary | ICD-10-CM | POA: Diagnosis not present

## 2016-07-20 DIAGNOSIS — R350 Frequency of micturition: Secondary | ICD-10-CM | POA: Diagnosis not present

## 2016-07-20 DIAGNOSIS — B9689 Other specified bacterial agents as the cause of diseases classified elsewhere: Secondary | ICD-10-CM | POA: Diagnosis not present

## 2016-07-20 NOTE — Patient Instructions (Signed)
IF BLADDER PATTERNS DON'T RETURN TO BASELINE I WOULD BE HAPPY TO MAKE A UROLOGY REFERRAL. I WILL LET YOU KNOW IF THERE ARE ANY ABNORMALITIES WITH URINE WE COLLECTED TODAY

## 2016-07-20 NOTE — Progress Notes (Signed)
Subjective:    Patient ID: Jack [name], male    DOB: January 07, 1934, 83 y.o.   MRN: [id number]        Transitional Care Visit HPI  Jack [name] is here in follow up of his left fronto-parietal infarct. He has been home for about a week from inpatient rehab. He states that things have gone fairly well. He is walking with a cane. His sleep has been normal. He denies any pain. He's not had any respiratory issues. Appetite is good. His wife is at home for assistance as needed.   He is still having some urinary frequency but it's closer to baseline. He completed bactrim over the weekend.   He has started over at [facility] with PT and OT. evals were yesterday. He's ready to "get off the cane!" He mentioned even wanting to get back to work today.         Pain Inventory Average Pain 0 Pain Right Now 0 My pain is n/a  In the last 24 hours, has pain interfered with the following? General activity 0 Relation with others 0 Enjoyment of life 0 What TIME of day is your pain at its worst? n/a Sleep (in general) Fair  Pain is worse with: No pain Pain improves with: No pain Relief from Meds: 0  Mobility use a cane ability to climb steps?  yes do you drive?  no  Function retired  Neuro/Psych No problems in this area  Prior Studies Any changes since last visit?  no  Physicians involved in your care Any changes since last visit?  no   Family History  Problem Relation Age of Onset  . Family history unknown: Yes   Social History   Social History  . Marital status: Married    Spouse name: N/A  . Number of children: N/A  . Years of education: N/A   Social History Main Topics  . Smoking status: Never Smoker  . Smokeless tobacco: Never Used  . Alcohol use No  . Drug use: No  . Sexual activity: Not Asked   Other Topics Concern  . None   Social History Narrative   Married, exercises with stationary bike 5 days per week.   Eats healthy.  As of 03/2015   Past  Surgical History:  Procedure Laterality Date  . CARDIAC CATHETERIZATION  02/2010   LVEF 70%, severe mitral regurgitation, pulm HTN; prior in 01/2006  . INGUINAL HERNIA REPAIR     bilat  . MITRAL VALVE REPAIR  01/2006   mitral repair with Maze procedure, tricuspid annuloplasty, MAZE and LAA oversewn by Jack [name]   Past Medical History:  Diagnosis Date  . Atrial fibrillation ([place])    Jack [name]  . BPH (benign prostatic hypertrophy) 2012   prior therapy with finasteride, but as of 03/2015 no c/o; Jack [name]  . Cellulitis of face 11/08   [facility]  . CVA (cerebral infarction) 2009, 2015  . Echocardiogram abnormal 02/11/2010   LVEF 70%, severe mitral regurgitation, pulm HTN   . ED (erectile dysfunction)    Viagra prn, consult with Urology 2012  . Elevated PSA 2012   BPH; Jack [name], [facility]; no more PSAs needed as of 2012 consult  . Gait abnormality    uses cane, s/p CVA  . Hemiparesis affecting left side as late effect of cerebrovascular accident ([place])   . Hiatal hernia    per 2008 EUS  . Hyperlipidemia   . Hypertension   .  Microscopic hematuria    chronic, eval 2012 with Urology, benign at that time  . Obstructive uropathy 2012   Jack [name]  . Pancreatic mass 01/2006   inconsequential per CT 2007  . 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 Patent foramen ovale    hx/o, notation of fistula on echocardiogram from 2009  . Prophylactic antibiotic 2016   requires due to valve disease  . Schatzki's ring 2008   per endoscopic ultrasound; Jack [name]. Jack [name]  . Stroke ([place])   . Ventral hernia    surgery consult 01/2010 with Jack [name], elective repair if desired , but he declined  . Wears hearing aid    BP 126/79   Pulse 95   Resp 14   SpO2 97%   Opioid Risk Score:   Fall Risk Score:  `1  Depression screen PHQ 2/9  Depression screen [facility] 2/9 07/20/2016 02/24/2015    Decreased Interest 0 0  Down, Depressed, Hopeless 0 0  PHQ - 2 Score 0 0  Altered sleeping 0 -  Tired, decreased energy 2 -  Change in appetite 2 -  Feeling bad or failure about yourself  0 -  Trouble concentrating 0 -  Moving slowly or fidgety/restless 0 -  Suicidal thoughts 0 -  PHQ-9 Score 4 -    Review of Systems  Constitutional: Negative.   HENT: Negative.   Eyes: Negative.   Respiratory: Negative.   Cardiovascular: Negative.   Gastrointestinal: Negative.   Endocrine: Negative.   Genitourinary: Negative.   Musculoskeletal: Negative.   Skin: Negative.   Allergic/Immunologic: Negative.   Hematological: Negative.   Psychiatric/Behavioral: Negative.   All other systems reviewed and are negative.      Objective:   Physical Exam Constitutional: He appears well-developed.  HENT:  Head: Normocephalicand atraumatic.  Eyes: EOMare normal.  Neck: Normal range of motion. Neck supple. No JVDpresent. No tracheal deviationpresent. No thyromegalypresent.  Cardiovascular: RRR Respiratory: cta bilaterally   GI: Soft. Bowel sounds are normal. He exhibits no distension. Bruising over LLQ  Musculoskeletal: He exhibits no edema.  Skin: Skin is warmand dry.  Musculoskeletal: He exhibits no current LE edemaor tenderness.  Neurological: He is alertand oriented to person, place, and time. CN notable for mild left central 7.  HOH but functional with hearing aid. Some decreased LT LUE Motor: RUE: 4+/5 deltoid, bicep, tricep, wrist, HI. RLE: 4+HF, 4+/5 KE and ADF/PF   LUE: 4/5 deltoid, bicep, tricep, wrist, HI. LLE: 4 HF,KE and ADF/PF --stable  DTRs 3+ LUE/LLE He ambulated for me and the left hip tends to drop during stance. He often holds the left hand over the hip for support. He is fairly steady ambulating with his cane. He has tremendous hip sway up and down without the cane.  Skin: Skin is warmand dry.  Psychiatric: His mood is pleasant       Assessment & Plan:  1.  New right hemiplegia gait disordersecondary to acute cortical/subcortical infarct left high frontoparietal region as well as history of hemorrhagic CVA in the past with chronic spastic left hemiparesis -beginning outpt PT and OT.  -has potential to progress from cane at least for house hold mobility 2.  Pain Management: Tylenol as needed-- 4. Hypertension/atrial fibrillation. Norvasc 10 mg daily. Cardiac rate controlled   . Patient has refused Eliquisand remains on aspirin only           -continue HCTZ with amlodipine  -needs outpt cards  follow up at some point Wynonia Lawman) 5. Urinary frequency/hx of enterobacter UTI:            -voiding pattern improved but still more frequent then baseline  -recollect urine specimen today  Thirty minutes of face to face patient care time were spent during this visit. All questions were encouraged and answered. Follow up with me in about 2 months time. He is still in the process of finding a new PCP. Is considering [facility]  Meredith Staggers, MD, [facility] 07/20/2016

## 2016-07-21 LAB — UA/M W/RFLX CULTURE, COMP
BILIRUBIN UA: NEGATIVE
GLUCOSE, UA: NEGATIVE
KETONES UA: NEGATIVE
LEUKOCYTES UA: NEGATIVE
Nitrite, UA: NEGATIVE
Protein, UA: NEGATIVE
RBC, UA: NEGATIVE
SPEC GRAV UA: 1.009 (ref 1.005–1.030)
UUROB: 0.2 mg/dL (ref 0.2–1.0)
pH, UA: 7.5 (ref 5.0–7.5)

## 2016-07-21 LAB — MICROSCOPIC EXAMINATION
BACTERIA UA: NONE SEEN
Casts: NONE SEEN /lpf
Epithelial Cells (non renal): NONE SEEN /hpf (ref 0–10)

## 2016-07-27 ENCOUNTER — Ambulatory Visit (INDEPENDENT_AMBULATORY_CARE_PROVIDER_SITE_OTHER): Payer: Medicare Other

## 2016-07-27 ENCOUNTER — Encounter (HOSPITAL_COMMUNITY): Payer: Self-pay | Admitting: Radiology

## 2016-07-27 ENCOUNTER — Ambulatory Visit (INDEPENDENT_AMBULATORY_CARE_PROVIDER_SITE_OTHER): Payer: Medicare Other | Admitting: Family Medicine

## 2016-07-27 ENCOUNTER — Ambulatory Visit: Payer: Medicare Other | Admitting: Occupational Therapy

## 2016-07-27 ENCOUNTER — Ambulatory Visit: Payer: Medicare Other | Admitting: Physical Therapy

## 2016-07-27 ENCOUNTER — Emergency Department (HOSPITAL_COMMUNITY)
Admission: EM | Admit: 2016-07-27 | Discharge: 2016-07-28 | Disposition: A | Discharge status: Home / Self Care | Payer: Medicare Other | Attending: Emergency Medicine | Admitting: Emergency Medicine

## 2016-07-27 ENCOUNTER — Emergency Department (HOSPITAL_COMMUNITY): Payer: Medicare Other

## 2016-07-27 VITALS — BP 140/66 | HR 93 | Temp 98.1°F | Resp 20 | Ht 61.0 in | Wt 148.6 lb

## 2016-07-27 DIAGNOSIS — Z8673 Personal history of transient ischemic attack (TIA), and cerebral infarction without residual deficits: Secondary | ICD-10-CM

## 2016-07-27 DIAGNOSIS — Z79899 Other long term (current) drug therapy: Secondary | ICD-10-CM | POA: Diagnosis not present

## 2016-07-27 DIAGNOSIS — Z7982 Long term (current) use of aspirin: Secondary | ICD-10-CM | POA: Diagnosis not present

## 2016-07-27 DIAGNOSIS — I4891 Unspecified atrial fibrillation: Secondary | ICD-10-CM | POA: Diagnosis not present

## 2016-07-27 DIAGNOSIS — Z8679 Personal history of other diseases of the circulatory system: Secondary | ICD-10-CM | POA: Diagnosis not present

## 2016-07-27 DIAGNOSIS — R0602 Shortness of breath: Secondary | ICD-10-CM

## 2016-07-27 DIAGNOSIS — J9801 Acute bronchospasm: Secondary | ICD-10-CM | POA: Diagnosis not present

## 2016-07-27 DIAGNOSIS — I10 Essential (primary) hypertension: Secondary | ICD-10-CM | POA: Diagnosis not present

## 2016-07-27 DIAGNOSIS — R062 Wheezing: Secondary | ICD-10-CM | POA: Diagnosis not present

## 2016-07-27 LAB — BASIC METABOLIC PANEL
Anion gap: 11 (ref 5–15)
BUN: 15 mg/dL (ref 6–20)
CHLORIDE: 92 mmol/L — AB (ref 101–111)
CO2: 30 mmol/L (ref 22–32)
CREATININE: 0.75 mg/dL (ref 0.61–1.24)
Calcium: 9 mg/dL (ref 8.9–10.3)
Glucose, Bld: 106 mg/dL — ABNORMAL HIGH (ref 65–99)
Potassium: 3.2 mmol/L — ABNORMAL LOW (ref 3.5–5.1)
SODIUM: 133 mmol/L — AB (ref 135–145)

## 2016-07-27 LAB — CBC WITH DIFFERENTIAL/PLATELET
Basophils Absolute: 0.1 10*3/uL (ref 0.0–0.1)
Basophils Relative: 1 %
Eosinophils Absolute: 0.1 10*3/uL (ref 0.0–0.7)
Eosinophils Relative: 2 %
HEMATOCRIT: 39.7 % (ref 39.0–52.0)
HEMOGLOBIN: 13.3 g/dL (ref 13.0–17.0)
LYMPHS PCT: 24 %
Lymphs Abs: 1.8 10*3/uL (ref 0.7–4.0)
MCH: 29 pg (ref 26.0–34.0)
MCHC: 33.5 g/dL (ref 30.0–36.0)
MCV: 86.7 fL (ref 78.0–100.0)
Monocytes Absolute: 0.8 10*3/uL (ref 0.1–1.0)
Monocytes Relative: 12 %
NEUTROS ABS: 4.4 10*3/uL (ref 1.7–7.7)
Neutrophils Relative %: 61 %
Platelets: 356 10*3/uL (ref 150–400)
RBC: 4.58 MIL/uL (ref 4.22–5.81)
RDW: 14.1 % (ref 11.5–15.5)
WBC: 7.2 10*3/uL (ref 4.0–10.5)

## 2016-07-27 LAB — BRAIN NATRIURETIC PEPTIDE: B NATRIURETIC PEPTIDE 5: 89 pg/mL (ref 0.0–100.0)

## 2016-07-27 LAB — D-DIMER, QUANTITATIVE: D-Dimer, Quant: 0.56 ug/mL-FEU — ABNORMAL HIGH (ref 0.00–0.50)

## 2016-07-27 MED ORDER — METHYLPREDNISOLONE SODIUM SUCC 125 MG IJ SOLR
125.0000 mg | Freq: Once | INTRAMUSCULAR | Status: AC
  Administered 2016-07-27: 125 mg via INTRAVENOUS
  Filled 2016-07-27: qty 2

## 2016-07-27 MED ORDER — MAGNESIUM SULFATE 2 GM/50ML IV SOLN
2.0000 g | Freq: Once | INTRAVENOUS | Status: AC
  Administered 2016-07-27: 2 g via INTRAVENOUS
  Filled 2016-07-27: qty 50

## 2016-07-27 MED ORDER — IPRATROPIUM BROMIDE 0.02 % IN SOLN
1.0000 mg | Freq: Once | RESPIRATORY_TRACT | Status: AC
  Administered 2016-07-27: 1 mg via RESPIRATORY_TRACT
  Filled 2016-07-27: qty 5

## 2016-07-27 MED ORDER — IOPAMIDOL (ISOVUE-370) INJECTION 76%
INTRAVENOUS | Status: AC
  Administered 2016-07-27: 100 mL
  Filled 2016-07-27: qty 100

## 2016-07-27 MED ORDER — ALBUTEROL SULFATE (2.5 MG/3ML) 0.083% IN NEBU
5.0000 mg | INHALATION_SOLUTION | Freq: Once | RESPIRATORY_TRACT | Status: AC
  Administered 2016-07-27: 5 mg via RESPIRATORY_TRACT
  Filled 2016-07-27: qty 6

## 2016-07-27 MED ORDER — ALBUTEROL (5 MG/ML) CONTINUOUS INHALATION SOLN
10.0000 mg/h | INHALATION_SOLUTION | Freq: Once | RESPIRATORY_TRACT | Status: AC
  Administered 2016-07-27: 10 mg/h via RESPIRATORY_TRACT
  Filled 2016-07-27: qty 20

## 2016-07-27 NOTE — ED Provider Notes (Signed)
[facility] DEPT Provider Note   CSN: [id number] Arrival date & time: 07/27/16  1912     History   Chief Complaint Chief Complaint  Patient presents with  . Shortness of Breath    HPI Jack [name] is a 83 y.o. male.  HPI 83 year old male with a history of A. fib, CVA, hypertension, hyperlipidemia presenting with shortness of breath. He is a worsening shortness of breath since Saturday and was seen at urgent care earlier today noted to have significant wheezing and accessory muscle use. He was sent here for further examination. He endorses cough but denies sputum production. He denies recent fevers, chest pain, abdominal pain, nausea, vomiting, diarrhea. Denies any history of COPD or asthma. No Alleviating or exacerbating factors.  Past Medical History:  Diagnosis Date  . Atrial fibrillation ([place])    Dr. Tollie Eth  . BPH (benign prostatic hypertrophy) 2012   prior therapy with finasteride, but as of 03/2015 no c/o; Dr. Luanne Bras  . Cellulitis of face 11/08   [facility] ENT  . CVA (cerebral infarction) 2009, 2015  . Echocardiogram abnormal 02/11/2010   LVEF 70%, severe mitral regurgitation, pulm HTN   . ED (erectile dysfunction)    Viagra prn, consult with Urology 2012  . Elevated PSA 2012   BPH; Dr. Luanne Bras, [facility]; no more PSAs needed as of 2012 consult  . Gait abnormality    uses cane, s/p CVA  . Hemiparesis affecting left side as late effect of cerebrovascular accident ([place])   . Hiatal hernia    per 2008 EUS  . Hyperlipidemia   . Hypertension   . Microscopic hematuria    chronic, eval 2012 with Urology, benign at that time  . Obstructive uropathy 2012   Dr. Luanne Bras  . Pancreatic mass 01/2006   inconsequential per CT 2007  . 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 Patent foramen ovale    hx/o, notation of fistula on echocardiogram from 2009  . Prophylactic antibiotic 2016   requires due to  valve disease  . Schatzki's ring 2008   per endoscopic ultrasound; Dr. Janetta Hora. [name]  . Stroke ([place])   . Ventral hernia    surgery consult 01/2010 with Dr. Excell Seltzer, elective repair if desired , but he declined  . Wears hearing aid     Patient Active Problem List   Diagnosis Date Noted  . Urinary frequency 07/20/2016  . Subcortical infarction ([place]) 07/09/2016  . PFO (patent foramen ovale)   . History of CVA with residual deficit   . Benign essential HTN   . Non compliance with medical treatment   . Hypokalemia   . Acute blood loss anemia   . Acute CVA (cerebrovascular accident) ([place]) 07/05/2016  . Embolic stroke involving left middle cerebral artery ([place])   . TIA (transient ischemic attack) 07/03/2016  . Right sided weakness 07/03/2016  . Microscopic hematuria 04/03/2015  . Hyperlipidemia 04/03/2015  . Wears hearing aid 04/03/2015  . Changing skin lesion 04/03/2015  . Paresthesia and pain of left extremity 04/02/2015  . Encounter for health maintenance examination in adult 04/02/2015  . Vaccine counseling 04/02/2015  . Hernia of abdominal cavity 04/02/2015  . Impaired gait and mobility 04/02/2015  . Leg swelling 03/03/2015  . Essential hypertension 03/03/2015  . Spastic hemiplegia affecting nondominant side ([place]) 12/14/2013  . ICH (intracerebral hemorrhage) ([place]) 08/02/2013  . Hx of mitral valve repair   . History of stroke   .  History of atrial fibrillation   . Cyst and pseudocyst of pancreas 09/13/2007  . PVC (premature ventricular contraction)   . History of renal cyst   . BPH (benign prostatic hypertrophy)     Past Surgical History:  Procedure Laterality Date  . CARDIAC CATHETERIZATION  02/2010   LVEF 70%, severe mitral regurgitation, pulm HTN; prior in 01/2006  . INGUINAL HERNIA REPAIR     bilat  . MITRAL VALVE REPAIR  01/2006   mitral repair with Maze procedure, tricuspid annuloplasty, MAZE and LAA oversewn by Dr. Roxy Manns        Home Medications    Prior to Admission medications   Medication Sig Start Date End Date Taking? Authorizing Provider  acetaminophen (TYLENOL) 325 MG tablet Take 1-2 tablets (325-650 mg total) by mouth every 4 (four) hours as needed for mild pain. 08/30/13   Ivan Anchors Love, PA-C  amLODipine (NORVASC) 10 MG tablet Take 1 tablet (10 mg total) by mouth daily. 07/16/16   Lavon Paganini Angiulli, PA-C  aspirin EC 325 MG EC tablet Take 1 tablet (325 mg total) by mouth daily. 07/09/16   Costin Karlyne Greenspan, MD  Cholecalciferol (VITAMIN D3) 3000 UNITS TABS Take by mouth.    Historical Provider, MD  hydrochlorothiazide (MICROZIDE) 12.5 MG capsule Take 1 capsule (12.5 mg total) by mouth daily. 07/16/16   Lavon Paganini Angiulli, PA-C  MAGNESIUM PO Take by mouth daily.    Historical Provider, MD  pravastatin (PRAVACHOL) 40 MG tablet Take 1 tablet (40 mg total) by mouth daily at 6 PM. 07/16/16   Cathlyn Parsons, PA-C    Family History Family History  Problem Relation Age of Onset  . Family history unknown: Yes    Social History Social History  Substance Use Topics  . Smoking status: Never Smoker  . Smokeless tobacco: Never Used  . Alcohol use No     Allergies   Valsartan   Review of Systems Review of Systems  Constitutional: Negative for chills and fever.  HENT: Negative for ear pain and sore throat.   Eyes: Negative for pain and visual disturbance.  Respiratory: Positive for chest tightness, shortness of breath and wheezing. Negative for cough.   Cardiovascular: Negative for chest pain and palpitations.  Gastrointestinal: Negative for abdominal pain and vomiting.  Genitourinary: Negative for dysuria and hematuria.  Musculoskeletal: Negative for arthralgias and back pain.  Skin: Negative for color change and rash.  Neurological: Negative for seizures and syncope.  All other systems reviewed and are negative.    Physical Exam Updated Vital Signs BP 139/84 (BP Location: Right Arm)   Pulse  103   Temp 98.4 F (36.9 C) (Oral)   Resp 20   Ht 5\' 2"  (1.575 m)   Wt 63.5 kg   SpO2 98%   BMI 25.61 kg/m   Physical Exam  Constitutional: He appears well-developed and well-nourished.  HENT:  Head: Normocephalic and atraumatic.  Eyes: Conjunctivae are normal.  Neck: Neck supple.  Cardiovascular: Normal rate and regular rhythm.   No murmur heard. Pulmonary/Chest: Accessory muscle usage present. Tachypnea noted. He is in respiratory distress. He has decreased breath sounds. He has wheezes (inspiratory and expiratory).  Abdominal: Soft. There is no tenderness.  Musculoskeletal: He exhibits no edema.  Neurological: He is alert.  Skin: Skin is warm and dry. Capillary refill takes less than 2 seconds.  Psychiatric: He has a normal mood and affect.  Nursing note and vitals reviewed.    ED Treatments / Results  Labs (  all labs ordered are listed, but only abnormal results are displayed) Labs Reviewed - No data to display  EKG  EKG Interpretation None       Radiology Dg Chest 2 View  Result Date: 07/27/2016 CLINICAL DATA:  Wheezing and dyspnea EXAM: CHEST  2 VIEW COMPARISON:  03/21/2006 CXR FINDINGS: Stable cardiomegaly with aortic atherosclerosis. The patient is status median sternotomy with cardiac valvular replacement. Atelectasis at each lung base left greater than right. No confluent airspace disease or overt pulmonary edema. Probable small hiatal hernia. No acute osseous abnormality. IMPRESSION: No active cardiopulmonary disease.  Bibasilar atelectasis. Electronically Signed   By: Ashley Royalty M.D.   On: 07/27/2016 18:03    Procedures Procedures (including critical care time)  Medications Ordered in ED Medications  albuterol (PROVENTIL) (2.5 MG/3ML) 0.083% nebulizer solution 5 mg (5 mg Nebulization Given 07/27/16 1956)     Initial Impression / Assessment and Plan / ED Course  I have reviewed the triage vital signs and the nursing notes.  Pertinent labs & imaging  results that were available during my care of the patient were reviewed by me and considered in my medical decision making (see chart for details).  Clinical Course    83 year old male presenting with respiratory distress and wheezing. On exam decreased breath sounds and inspiratory and expiratory wheezing throughout. Respiratory therapy placed him on albuterol continuous nebulizer. He was given Solu-Medrol and labs ordered.  CBC, BMP, BNP is unremarkable. D-dimer 0.5. CT PE ordered to rule out acute embolism as he has a history of stroke with left-sided weakness and decreased mobility. No sign of DVT on physical exam. Chest x-ray without focal opacity, pneumothorax, mediastinum. EKG with new RBBB. Denies CP concern for Pe. May also be due to pulm vascular constriction  CT PE negative for PE.  After the albuterol treatment patient continued to have decreased breath sounds and wheezing. He was given magnesium and placed on a continuous albuterol and Atrovent nebulizer.  If pt is able to ambulate without difficulty, he may go home with PCP as he had no infectious symptoms or fevers. However if still SOB after continuous, may need admission.  Final Clinical Impressions(s) / ED Diagnoses   Final diagnoses:  None    New Prescriptions New Prescriptions   No medications on file     Asah Lamay Mali Leeandra Ellerson, MD 07/28/16 0036    Ryosuke Ericksen Mali Curtis Cain, MD 07/28/16 [id number]    Duffy Bruce, MD 07/28/16 1313

## 2016-07-27 NOTE — ED Notes (Signed)
Pt returned from xray and connected to the monitor 

## 2016-07-27 NOTE — Progress Notes (Signed)
By signing my name below, I, Mesha Guinyard, attest that this documentation has been prepared under the direction and in the presence of Merri Ray, MD.  Electronically Signed: Verlee Monte, Medical Scribe. 07/27/16. 5:14 PM.  Subjective:    Patient ID: Jack [name], male    DOB: 1933-08-13, 83 y.o.   MRN: [id number]  HPI Chief Complaint  Patient presents with  . Cough    x 3-4 days  . Wheezing    SOB    HPI Comments: Jack [name] is a 83 y.o. male with a PMHx of HTN, A. Fib who presents to the [facility] complaining of wheezing and SOB onset 2-3 days ago. Reports associated sxs of coughing and notes he has SOB most of the time. Denies lower leg swelling, fever, and PMHx of asthma, wheezing, or emphysema.  Admitted 12/23-29 with small sub cortical infarct left frontal parietal region and right occipital lobe infarct. Discharged Jan 5th 2018. Cardiologist is Dr. Wynonia Lawman. Last EKG Dec 23rd- sinus rhythm, rate 89 with atrial premature complexes. Echo 07/05/16 EF 60-65%.  Patient Active Problem List   Diagnosis Date Noted  . Urinary frequency 07/20/2016  . Subcortical infarction ([place]) 07/09/2016  . PFO (patent foramen ovale)   . History of CVA with residual deficit   . Benign essential HTN   . Non compliance with medical treatment   . Hypokalemia   . Acute blood loss anemia   . Acute CVA (cerebrovascular accident) ([place]) 07/05/2016  . Embolic stroke involving left middle cerebral artery ([place])   . TIA (transient ischemic attack) 07/03/2016  . Right sided weakness 07/03/2016  . Microscopic hematuria 04/03/2015  . Hyperlipidemia 04/03/2015  . Wears hearing aid 04/03/2015  . Changing skin lesion 04/03/2015  . Paresthesia and pain of left extremity 04/02/2015  . Encounter for health maintenance examination in adult 04/02/2015  . Vaccine counseling 04/02/2015  . Hernia of abdominal cavity 04/02/2015  . Impaired gait and mobility 04/02/2015  . Leg swelling  03/03/2015  . Essential hypertension 03/03/2015  . Spastic hemiplegia affecting nondominant side ([place]) 12/14/2013  . ICH (intracerebral hemorrhage) ([place]) 08/02/2013  . Hx of mitral valve repair   . History of stroke   . History of atrial fibrillation   . Cyst and pseudocyst of pancreas 09/13/2007  . PVC (premature ventricular contraction)   . History of renal cyst   . BPH (benign prostatic hypertrophy)    Past Medical History:  Diagnosis Date  . Atrial fibrillation ([place])    Dr. Tollie Eth  . BPH (benign prostatic hypertrophy) 2012   prior therapy with finasteride, but as of 03/2015 no c/o; Dr. Luanne Bras  . Cellulitis of face 11/08   [facility] ENT  . CVA (cerebral infarction) 2009, 2015  . Echocardiogram abnormal 02/11/2010   LVEF 70%, severe mitral regurgitation, pulm HTN   . ED (erectile dysfunction)    Viagra prn, consult with Urology 2012  . Elevated PSA 2012   BPH; Dr. Luanne Bras, [facility]; no more PSAs needed as of 2012 consult  . Gait abnormality    uses cane, s/p CVA  . Hemiparesis affecting left side as late effect of cerebrovascular accident ([place])   . Hiatal hernia    per 2008 EUS  . Hyperlipidemia   . Hypertension   . Microscopic hematuria    chronic, eval 2012 with Urology, benign at that time  . Obstructive uropathy 2012   Dr. Luanne Bras  . Pancreatic mass 01/2006  inconsequential per CT 2007  . 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 Patent foramen ovale    hx/o, notation of fistula on echocardiogram from 2009  . Prophylactic antibiotic 2016   requires due to valve disease  . Schatzki's ring 2008   per endoscopic ultrasound; Dr. Janetta Hora. [name]  . Stroke ([place])   . Ventral hernia    surgery consult 01/2010 with Dr. Excell Seltzer, elective repair if desired , but he declined  . Wears hearing aid    Past Surgical History:  Procedure Laterality Date  . CARDIAC  CATHETERIZATION  02/2010   LVEF 70%, severe mitral regurgitation, pulm HTN; prior in 01/2006  . INGUINAL HERNIA REPAIR     bilat  . MITRAL VALVE REPAIR  01/2006   mitral repair with Maze procedure, tricuspid annuloplasty, MAZE and LAA oversewn by Dr. Roxy Manns   Allergies  Allergen Reactions  . Valsartan Swelling    angioedema   Prior to Admission medications   Medication Sig Start Date End Date Taking? Authorizing Provider  acetaminophen (TYLENOL) 325 MG tablet Take 1-2 tablets (325-650 mg total) by mouth every 4 (four) hours as needed for mild pain. 08/30/13  Yes Ivan Anchors Love, PA-C  amLODipine (NORVASC) 10 MG tablet Take 1 tablet (10 mg total) by mouth daily. 07/16/16  Yes Daniel J Angiulli, PA-C  aspirin EC 325 MG EC tablet Take 1 tablet (325 mg total) by mouth daily. 07/09/16  Yes Costin Karlyne Greenspan, MD  Cholecalciferol (VITAMIN D3) 3000 UNITS TABS Take by mouth.   Yes Historical Provider, MD  hydrochlorothiazide (MICROZIDE) 12.5 MG capsule Take 1 capsule (12.5 mg total) by mouth daily. 07/16/16  Yes Daniel J Angiulli, PA-C  MAGNESIUM PO Take by mouth daily.   Yes Historical Provider, MD  pravastatin (PRAVACHOL) 40 MG tablet Take 1 tablet (40 mg total) by mouth daily at 6 PM. 07/16/16  Yes Cathlyn Parsons, PA-C   Social History   Social History  . Marital status: Married    Spouse name: Jack [name]  . Number of children: Jack [name]  . Years of education: Jack [name]   Occupational History  . Not on file.   Social History Main Topics  . Smoking status: Never Smoker  . Smokeless tobacco: Never Used  . Alcohol use No  . Drug use: No  . Sexual activity: Not on file   Other Topics Concern  . Not on file   Social History Narrative   Married, exercises with stationary bike 5 days per week.   Eats healthy.  As of 03/2015   Review of Systems  Respiratory: Positive for shortness of breath and wheezing.   Cardiovascular: Negative for leg swelling.   Objective:  Physical Exam  Constitutional: He appears  well-developed and well-nourished. No distress.  HENT:  Head: Normocephalic and atraumatic.  Eyes: Conjunctivae are normal.  Neck: Neck supple.  Cardiovascular: Normal rate.  A regularly irregular rhythm present.  Pulmonary/Chest: Effort normal. He has wheezes (inspiratory and expiratory). He has no rales.  Musculoskeletal: He exhibits edema.  1-2+ edema lower tibia bilaterally  Neurological: He is alert.  Skin: Skin is warm and dry.  Psychiatric: He has a normal mood and affect. His behavior is normal.  Nursing note and vitals reviewed.  BP 140/66 (BP Location: Right Arm, Patient Position: Sitting, Cuff Size: Normal)   Pulse 93   Temp 98.1 F (36.7 C) (Oral)   Resp 20   Ht 5\' 1"  (1.549 m)  Wt 148 lb 9.6 oz (67.4 kg)   SpO2 97%   BMI 28.08 kg/m    Wt Readings from Last 3 Encounters:  07/27/16 148 lb 9.6 oz (67.4 kg)  07/15/16 142 lb 3.2 oz (64.5 kg)  07/07/16 147 lb 7.8 oz (66.9 kg)   EKG Reading: A. Fib. Rate 93. RBBB with left axis bifascicular block. Changes in V1 and V3 appear to be new compared to 07/03/16  Dg Chest 2 View  Result Date: 07/27/2016 CLINICAL DATA:  Wheezing and dyspnea EXAM: CHEST  2 VIEW COMPARISON:  03/21/2006 CXR FINDINGS: Stable cardiomegaly with aortic atherosclerosis. The patient is status median sternotomy with cardiac valvular replacement. Atelectasis at each lung base left greater than right. No confluent airspace disease or overt pulmonary edema. Probable small hiatal hernia. No acute osseous abnormality. IMPRESSION: No active cardiopulmonary disease.  Bibasilar atelectasis. Electronically Signed   By: Ashley Royalty M.D.   On: 07/27/2016 18:03   Assessment & Plan:  RIC CRAFTS is a 83 y.o. male Shortness of breath - Plan: DG Chest 2 View  Atrial fibrillation, unspecified type ([place]) - Plan: EKG 12-Lead  Wheezing  History of CVA (cerebrovascular accident)  History of intracranial hemorrhage  History of multiple medical problems including  atrial fibrillation, recent hospitalization for CVA now with 3 days of dyspnea. Afebrile. Diffuse wheezing on exam, atrial fibrillation noted on EKG with rate of 93, incomplete RBBB. Discussed with cardiology, appears to have had RBBB in the past. No known history of asthma or COPD, initial concern of possible CHF, but no apparent CHF on chest x-ray.   -Discussed trial of albuterol nebulizer treatment, but with atrial fibrillation and other comorbidities, decided this would be best done through emergency room and cardiac monitoring.  - Sent to ER by private vehicle, advised triage nurse.  Over 40 minutes of care involved including review of previous records, hospitalization, discussion with specialist and plan of care with patient.    No orders of the defined types were placed in this encounter.  Patient Instructions   I spoke with your cardiologist, he does recommend you be evaluated through the hospital tonight.  You are wheezing and a nebulizer treatment may be needed, but with your atrial fibrillation, I recommend you be monitored in the emergency room for that treatment as well as to determine if other evaluation needed for your symptoms. Go directly to [facility] ER after leaving our office, I did advise the triage nurse that you're on the way.  IF you received an x-ray today, you will receive an invoice from [facility] Rad[facility]. Please contact [facility] Rad[facility] at [phone] with questions or concerns regarding your invoice.   IF you received labwork today, you will receive an invoice from [facility]. Please contact [facility] at [phone] with questions or concerns regarding your invoice.   Our billing staff will not be able to assist you with questions regarding bills from these companies.  You will be contacted with the lab results as soon as they are available. The fastest way to get your results is to activate your My Chart account. Instructions are located on the last page of  this paperwork. If you have not heard from [place] regarding the results in 2 weeks, please contact this office.       I personally performed the services described in this documentation, which was scribed in my presence. The recorded information has been reviewed and considered, and addended by me as needed.   Signed,   Dellis Filbert  Carlota Raspberry, MD [facility].  07/27/16 7:20 PM

## 2016-07-27 NOTE — ED Notes (Signed)
Patient transported to CT 

## 2016-07-27 NOTE — ED Notes (Signed)
Patient transported to X-ray 

## 2016-07-27 NOTE — Patient Instructions (Addendum)
I spoke with your cardiologist, he does recommend you be evaluated through the hospital tonight.  You are wheezing and a nebulizer treatment may be needed, but with your atrial fibrillation, I recommend you be monitored in the emergency room for that treatment as well as to determine if other evaluation needed for your symptoms. Go directly to [facility] ER after leaving our office, I did advise the triage nurse that you're on the way.  IF you received an x-ray today, you will receive an invoice from [facility] Radiology. Please contact [facility] Radiology at [phone] with questions or concerns regarding your invoice.   IF you received labwork today, you will receive an invoice from [facility]. Please contact [facility] at [phone] with questions or concerns regarding your invoice.   Our billing staff will not be able to assist you with questions regarding bills from these companies.  You will be contacted with the lab results as soon as they are available. The fastest way to get your results is to activate your My Chart account. Instructions are located on the last page of this paperwork. If you have not heard from [place] regarding the results in 2 weeks, please contact this office.

## 2016-07-27 NOTE — ED Triage Notes (Signed)
Pt's spouse states pt started having SOB over the weekend; pt was sent by Urgent care for wheezing and SOB; UC had concerns for Afib; Pt currently in controled Afib at triage; Pt has an audiable wheeze at triage; Pt denies pain on arrival; pt a&ox 4

## 2016-07-28 MED ORDER — ALBUTEROL SULFATE HFA 108 (90 BASE) MCG/ACT IN AERS
2.0000 | INHALATION_SPRAY | RESPIRATORY_TRACT | Status: DC | PRN
  Administered 2016-07-28: 2 via RESPIRATORY_TRACT
  Filled 2016-07-28: qty 6.7

## 2016-07-28 MED ORDER — PREDNISONE 20 MG PO TABS: 40.0000 mg | ORAL_TABLET | Freq: Every day | ORAL | 0 refills | Status: DC

## 2016-07-28 MED ORDER — DOXYCYCLINE HYCLATE 100 MG PO CAPS: 100.0000 mg | ORAL_CAPSULE | Freq: Two times a day (BID) | ORAL | 0 refills | Status: DC

## 2016-07-28 NOTE — ED Notes (Signed)
Pt returned from CT and connected to the monitor 

## 2016-07-28 NOTE — ED Provider Notes (Signed)
Assumed care from Dr. Ellender Hose. Patient had been seen previously with difficulty breathing, identified with significant bronchospasm. Recheck after continuous albuterol nebulizer treatment reveals that he is feeling much improvement. Oxygenation is normal. Vital signs are all within normal limits. Repeat lung examination reveals scattered wheezing but good air movement. Patient appropriate for discharge, continue bronchodilator therapy, add prednisone, doxycycline. Follow-up with primary doctor in 2 days for recheck.  Results for orders placed or performed during the hospital encounter of 07/27/16  CBC with Differential  Result Value Ref Range   WBC 7.2 4.0 - 10.5 K/uL   RBC 4.58 4.22 - 5.81 MIL/uL   Hemoglobin 13.3 13.0 - 17.0 g/dL   HCT 39.7 39.0 - 52.0 %   MCV 86.7 78.0 - 100.0 fL   MCH 29.0 26.0 - 34.0 pg   MCHC 33.5 30.0 - 36.0 g/dL   RDW 14.1 11.5 - 15.5 %   Platelets 356 150 - 400 K/uL   Neutrophils Relative % 61 %   Neutro Abs 4.4 1.7 - 7.7 K/uL   Lymphocytes Relative 24 %   Lymphs Abs 1.8 0.7 - 4.0 K/uL   Monocytes Relative 12 %   Monocytes Absolute 0.8 0.1 - 1.0 K/uL   Eosinophils Relative 2 %   Eosinophils Absolute 0.1 0.0 - 0.7 K/uL   Basophils Relative 1 %   Basophils Absolute 0.1 0.0 - 0.1 K/uL  Basic metabolic panel  Result Value Ref Range   Sodium 133 (L) 135 - 145 mmol/L   Potassium 3.2 (L) 3.5 - 5.1 mmol/L   Chloride 92 (L) 101 - 111 mmol/L   CO2 30 22 - 32 mmol/L   Glucose, Bld 106 (H) 65 - 99 mg/dL   BUN 15 6 - 20 mg/dL   Creatinine, Ser 0.75 0.61 - 1.24 mg/dL   Calcium 9.0 8.9 - 10.3 mg/dL   GFR calc non Af Amer >60 >60 mL/min   GFR calc Af Amer >60 >60 mL/min   Anion gap 11 5 - 15  D-dimer, quantitative (not at [facility])  Result Value Ref Range   D-Dimer, Quant 0.56 (H) 0.00 - 0.50 ug/mL-FEU  Brain natriuretic peptide  Result Value Ref Range   B Natriuretic Peptide 89.0 0.0 - 100.0 pg/mL   Dg Chest 2 View  Result Date: 07/27/2016 CLINICAL DATA:  SOB and  productive cough x 3 days with audible wheezing. - yellow No pain EXAM: CHEST  2 VIEW COMPARISON:  Chest x-ray dated 07/27/2016. FINDINGS: Heart size and mediastinal contours are stable. Median sternotomy wires appear intact and stable in alignment. Valvular hardware appears stable in position. Lungs are clear. No pleural effusion or pneumothorax seen. No acute or suspicious osseous finding. IMPRESSION: No active cardiopulmonary disease. No evidence of pneumonia or pulmonary edema. Electronically Signed   By: Franki Cabot M.D.   On: 07/27/2016 21:02   Dg Chest 2 View  Result Date: 07/27/2016 CLINICAL DATA:  Wheezing and dyspnea EXAM: CHEST  2 VIEW COMPARISON:  03/21/2006 CXR FINDINGS: Stable cardiomegaly with aortic atherosclerosis. The patient is status median sternotomy with cardiac valvular replacement. Atelectasis at each lung base left greater than right. No confluent airspace disease or overt pulmonary edema. Probable small hiatal hernia. No acute osseous abnormality. IMPRESSION: No active cardiopulmonary disease.  Bibasilar atelectasis. Electronically Signed   By: Ashley Royalty M.D.   On: 07/27/2016 18:03   Ct Head Wo Contrast  Result Date: 07/03/2016 CLINICAL DATA:  RIGHT-side weakness for several days worse since this morning, history hypertension,  stroke, atrial fibrillation, PFO EXAM: CT HEAD WITHOUT CONTRAST TECHNIQUE: Contiguous axial images were obtained from the base of the skull through the vertex without intravenous contrast. COMPARISON:  08/17/2013 FINDINGS: Brain: New generalized atrophy. Normal ventricular morphology. No midline shift or mass effect. Small vessel chronic ischemic changes of deep cerebral white matter. Small old lacunar infarcts at the thalami. Question small old lacunar infarct at the RIGHT cerebellar hemisphere. Old RIGHT parieto-occipital infarct. No intracranial hemorrhage, mass lesion or evidence of acute infarction. No extra-axial fluid collections. Vascular:  Scattered atherosclerotic calcifications Skull: Intact Sinuses/Orbits: Cleared Other: N/A IMPRESSION: Atrophy with small vessel chronic ischemic changes of deep cerebral white matter. Multiple old infarcts as above. No acute intracranial abnormalities. Electronically Signed   By: Lavonia Dana M.D.   On: 07/03/2016 15:45   Ct Angio Chest Pe W And/or Wo Contrast  Result Date: 07/28/2016 CLINICAL DATA:  Cough and shortness of breath.  Elevated D-dimer. EXAM: CT ANGIOGRAPHY CHEST WITH CONTRAST TECHNIQUE: Multidetector CT imaging of the chest was performed using the standard protocol during bolus administration of intravenous contrast. Multiplanar CT image reconstructions and MIPs were obtained to evaluate the vascular anatomy. CONTRAST:  100 cc Isovue 370 COMPARISON:  Radiographs earlier this day. FINDINGS: Cardiovascular: There are no filling defects within the pulmonary arteries to suggest pulmonary embolus. Atherosclerosis and tortuosity of the thoracic aorta without evidence of dissection. Post mitral and tricuspid valve replacements. Multi chamber cardiomegaly. There are coronary artery calcifications. Mediastinum/Nodes: No enlarged mediastinal or hilar lymph nodes. Patulous esophagus with small hiatal hernia. Trachea is patent. Questionable linear debris/ secretions within the left lower lobe bronchus, partially obscured by motion. Lungs/Pleura: Narrowing of the left lower lobe bronchus due to compression from the pulmonary artery and adjacent aorta. Scattered atelectasis or scarring in the left lower lobe. No confluent airspace disease. No evidence of pulmonary edema. No pleural fluid. Upper Abdomen: No acute abnormality. Musculoskeletal: Post median sternotomy. There are no acute or suspicious osseous abnormalities. Pseudoarthrosis between lateral right first and second rib, may be sequela of remote injury. Review of the MIP images confirms the above findings. IMPRESSION: 1. No pulmonary embolus. 2.  Questionable linear debris/ secretions in the left lower lobe bronchus, partially obscured by motion artifact. 3. Atherosclerosis including coronary artery calcifications. Prosthetic mitral and tricuspid valves with mild multi chamber cardiomegaly. Electronically Signed   By: Jeb Levering M.D.   On: 07/28/2016 00:29   Mr Brain Wo Contrast  Result Date: 07/04/2016 CLINICAL DATA:  Right-sided weakness. EXAM: MRI HEAD WITHOUT CONTRAST MRA HEAD WITHOUT CONTRAST TECHNIQUE: Multiplanar, multiecho pulse sequences of the brain and surrounding structures were obtained without intravenous contrast. Angiographic images of the head were obtained using MRA technique without contrast. COMPARISON:  Head CT 07/03/2016.  Head MRI/ MRA 12/12/2007. FINDINGS: MRI HEAD FINDINGS The examination had to be discontinued prior to completion due to patient vomiting. Sagittal T1, axial and coronal diffusion, axial T2, and axial FLAIR sequences were obtained. The the FLAIR sequence is severely motion degraded and nondiagnostic. Sagittal T1 and axial T2 sequences are mildly motion degraded. Brain: There are small foci of acute cortical and subcortical infarction in the high left frontoparietal region extending inferiorly into the centrum semiovale. There are also 2 punctate foci of acute cortical infarction in the right occipital lobe. Bilateral cerebral white matter T2 hyperintensities have progressed from 2009 and are compatible with chronic small vessel ischemic disease. Chronic right frontoparietal and left parieto-occipital infarcts are new from 2009. There is moderate cerebral atrophy. Small  chronic infarcts are present in the thalami and left cerebellum, with the thalamic infarcts new from 2009. No gross intracranial hemorrhage, mass, midline shift, or extra-axial fluid collection is seen. Vascular: Grossly preserved flow voids. Skull and upper cervical spine: No destructive osseous lesion. Sinuses/Orbits: Grossly unremarkable  orbits. No significant sinus disease. Other: None. MRA HEAD FINDINGS The study is mildly motion degraded. The visualized distal vertebral arteries are patent to the basilar with the right being minimally larger than the left. There is focal narrowing versus flow artifact involving the distal left vertebral artery at the vertebrobasilar junction. PICAs and SCAs appear patent proximally with atherosclerotic narrowing versus artifact in the left SCA. The basilar artery is widely patent. There is a patent left posterior communicating artery. PCAs are patent without evidence of significant stenosis. The internal carotid arteries are widely patent from skullbase to carotid termini. ACAs and MCAs are patent without evidence of significant proximal stenosis. Detailed branch vessel evaluation is limited by motion artifact. No intracranial aneurysm is identified. IMPRESSION: 1. Incomplete, motion degraded examination as above. 2. Small acute cortical/subcortical infarcts in the high left frontoparietal region. 3. Punctate acute right occipital lobe cortical infarcts. 4. Chronic cerebral and cerebellar infarcts as above. 5. No evidence of major intracranial arterial occlusion or significant proximal stenosis. Electronically Signed   By: Logan Bores M.D.   On: 07/04/2016 11:58   [name] Head/brain [id number] Cm  Result Date: 07/04/2016 CLINICAL DATA:  Right-sided weakness. EXAM: MRI HEAD WITHOUT CONTRAST MRA HEAD WITHOUT CONTRAST TECHNIQUE: Multiplanar, multiecho pulse sequences of the brain and surrounding structures were obtained without intravenous contrast. Angiographic images of the head were obtained using MRA technique without contrast. COMPARISON:  Head CT 07/03/2016.  Head MRI/ MRA 12/12/2007. FINDINGS: MRI HEAD FINDINGS The examination had to be discontinued prior to completion due to patient vomiting. Sagittal T1, axial and coronal diffusion, axial T2, and axial FLAIR sequences were obtained. The the FLAIR sequence is  severely motion degraded and nondiagnostic. Sagittal T1 and axial T2 sequences are mildly motion degraded. Brain: There are small foci of acute cortical and subcortical infarction in the high left frontoparietal region extending inferiorly into the centrum semiovale. There are also 2 punctate foci of acute cortical infarction in the right occipital lobe. Bilateral cerebral white matter T2 hyperintensities have progressed from 2009 and are compatible with chronic small vessel ischemic disease. Chronic right frontoparietal and left parieto-occipital infarcts are new from 2009. There is moderate cerebral atrophy. Small chronic infarcts are present in the thalami and left cerebellum, with the thalamic infarcts new from 2009. No gross intracranial hemorrhage, mass, midline shift, or extra-axial fluid collection is seen. Vascular: Grossly preserved flow voids. Skull and upper cervical spine: No destructive osseous lesion. Sinuses/Orbits: Grossly unremarkable orbits. No significant sinus disease. Other: None. MRA HEAD FINDINGS The study is mildly motion degraded. The visualized distal vertebral arteries are patent to the basilar with the right being minimally larger than the left. There is focal narrowing versus flow artifact involving the distal left vertebral artery at the vertebrobasilar junction. PICAs and SCAs appear patent proximally with atherosclerotic narrowing versus artifact in the left SCA. The basilar artery is widely patent. There is a patent left posterior communicating artery. PCAs are patent without evidence of significant stenosis. The internal carotid arteries are widely patent from skullbase to carotid termini. ACAs and MCAs are patent without evidence of significant proximal stenosis. Detailed branch vessel evaluation is limited by motion artifact. No intracranial aneurysm is identified. IMPRESSION: 1. Incomplete,  motion degraded examination as above. 2. Small acute cortical/subcortical infarcts in the  high left frontoparietal region. 3. Punctate acute right occipital lobe cortical infarcts. 4. Chronic cerebral and cerebellar infarcts as above. 5. No evidence of major intracranial arterial occlusion or significant proximal stenosis. Electronically Signed   By: Logan Bores M.D.   On: 07/04/2016 11:58   Vitals:   07/28/16 0130 07/28/16 0200  BP: 103/60 114/61  Pulse: 93 96  Resp: 18 18  Temp:        Orpah Greek, MD 07/28/16 [phone]

## 2016-07-29 ENCOUNTER — Encounter: Payer: Medicare Other | Admitting: Occupational Therapy

## 2016-07-29 ENCOUNTER — Ambulatory Visit: Payer: Medicare Other | Admitting: Physical Therapy

## 2016-08-02 ENCOUNTER — Ambulatory Visit: Payer: Medicare Other | Admitting: Physical Therapy

## 2016-08-02 ENCOUNTER — Ambulatory Visit: Payer: Medicare Other | Admitting: Occupational Therapy

## 2016-08-02 DIAGNOSIS — R278 Other lack of coordination: Secondary | ICD-10-CM

## 2016-08-02 DIAGNOSIS — I69353 Hemiplegia and hemiparesis following cerebral infarction affecting right non-dominant side: Secondary | ICD-10-CM

## 2016-08-02 DIAGNOSIS — G8112 Spastic hemiplegia affecting left dominant side: Secondary | ICD-10-CM

## 2016-08-02 DIAGNOSIS — M6281 Muscle weakness (generalized): Secondary | ICD-10-CM

## 2016-08-02 DIAGNOSIS — R2689 Other abnormalities of gait and mobility: Secondary | ICD-10-CM | POA: Diagnosis not present

## 2016-08-02 DIAGNOSIS — R208 Other disturbances of skin sensation: Secondary | ICD-10-CM

## 2016-08-02 DIAGNOSIS — I629 Nontraumatic intracranial hemorrhage, unspecified: Secondary | ICD-10-CM

## 2016-08-02 DIAGNOSIS — I69915 Cognitive social or emotional deficit following unspecified cerebrovascular disease: Secondary | ICD-10-CM

## 2016-08-02 NOTE — Therapy (Signed)
[facility] [address] [place], [place], [address] Phone: [phone]   Fax:  [phone]  Physical Therapy Treatment  Patient Details  Name: DACOTA AMELUNG MRN: [id number] Date of Birth: 04-Feb-1934 Referring Provider: Dr. Alger Simons  Encounter Date: 08/02/2016      PT End of Session - 08/02/16 1342    Visit Number 2   Number of Visits 17   Date for PT Re-Evaluation 09/17/16   Authorization Type [employer]-GCODE required every 10th visit   PT Start Time 1148   PT Stop Time 1233   PT Time Calculation (min) 45 min   Equipment Utilized During Treatment Gait belt   Activity Tolerance Patient limited by fatigue   Behavior During Therapy [facility] for tasks assessed/performed      Past Medical History:  Diagnosis Date  . Atrial fibrillation ([place])    Dr. Tollie Eth  . BPH (benign prostatic hypertrophy) 2012   prior therapy with finasteride, but as of 03/2015 no c/o; Dr. Luanne Bras  . Cellulitis of face 11/08   [facility]  . CVA (cerebral infarction) 2009, 2015  . Echocardiogram abnormal 02/11/2010   LVEF 70%, severe mitral regurgitation, pulm HTN   . ED (erectile dysfunction)    Viagra prn, consult with Urology 2012  . Elevated PSA 2012   BPH; Dr. Luanne Bras, [facility]; no more PSAs needed as of 2012 consult  . Gait abnormality    uses cane, s/p CVA  . Hemiparesis affecting left side as late effect of cerebrovascular accident ([place])   . Hiatal hernia    per 2008 EUS  . Hyperlipidemia   . Hypertension   . Microscopic hematuria    chronic, eval 2012 with Urology, benign at that time  . Obstructive uropathy 2012   Dr. Luanne Bras  . Pancreatic mass 01/2006   inconsequential per CT 2007  . 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 Patent foramen ovale    hx/o, notation of fistula on echocardiogram from 2009  . Prophylactic antibiotic 2016   requires due to valve disease  . Schatzki's ring 2008   per endoscopic ultrasound; Dr. Janetta Hora. [name]  . Stroke ([place])   . Ventral hernia    surgery consult 01/2010 with Dr. Excell Seltzer, elective repair if desired , but he declined  . Wears hearing aid     Past Surgical History:  Procedure Laterality Date  . CARDIAC CATHETERIZATION  02/2010   LVEF 70%, severe mitral regurgitation, pulm HTN; prior in 01/2006  . INGUINAL HERNIA REPAIR     bilat  . MITRAL VALVE REPAIR  01/2006   mitral repair with Maze procedure, tricuspid annuloplasty, MAZE and LAA oversewn by Dr. Roxy Manns    There were no vitals filed for this visit.      Subjective Assessment - 08/02/16 1313    Subjective Denies any falls since last visit. Reports "staggering steps" and wife reports she has not had to "catch" patient. Uses cane only sometimes inside home; always when goes out.   Patient is accompained by: Family member  wife   Pertinent History old Rt CVA with Lt spastic hemiparesis; new Lt CVA with Rt hemiparesis   Currently in Pain? No/denies                         [facility] Adult PT Treatment/Exercise - 08/02/16 1317      Transfers  Transfers Sit to Stand;Stand to Sit   Sit to Stand 6: Modified independent (Device/Increase time);Without upper extremity assist   Stand to Sit 6: Modified independent (Device/Increase time);Without upper extremity assist   Number of Reps 10 reps  10 feet //; 10 rt foot fwd; 10 left foot fwd   Comments supervision with staggered stance due to mild imbalance (self-recovered)     Ambulation/Gait   Ambulation/Gait Assistance 5: Supervision;4: Min guard   Ambulation/Gait Assistance Details vc (occ tactile cues) for upright posture over LLE in stance; handson guarding at all times without cane   Ambulation Distance (Feet) 110 Feet   x3 seated rests between; 2x cane; 1x no cane   Assistive device Straight cane;None   Gait Pattern Step-through  pattern;Decreased step length - right;Decreased dorsiflexion - left;Decreased step length - left;Decreased weight shift to left;Left foot flat;Wide base of support;Poor foot clearance - left  LLE in internal rotation   Ambulation Surface Level;Indoor   Pre-Gait Activities facing counter; stagger stance Lt foot in front; weight shift back onto RLE with lifting left toes/heel down; shifting forward onto LLE with hip and knee extension x 10 reps     Neuro Re-ed    Neuro Re-ed Details  Walking backwards // bars 10 ft light bil UE support x 5; forward walk //bars on heels x2, on toes x 2. Sideways walking emphasizing large steps both Lt/Rt x 4 each     Exercises   Exercises Knee/Hip     Knee/Hip Exercises: Stretches   Piriformis Stretch Both;2 reps;30 seconds  in sitting, foot crossing over opposite knee     Knee/Hip Exercises: Standing   Forward Step Up Left;1 set;10 reps;Hand Hold: 1;Step Height: 4"  LLE for ascend and controlled descend     Knee/Hip Exercises: Seated   Abduction/Adduction  Strengthening;Both;2 sets;15 reps  red tband proximal to knees; step one leg out/in                PT Education - 08/02/16 1340    Education provided Yes   Education Details Need to use cane when walking inside home. Educated in strengthening exercises for HEP.   Person(s) Educated Spouse;Patient   Methods Explanation;Demonstration;Handout   Comprehension Need further instruction          PT Short Term Goals - 08/02/16 1354      PT SHORT TERM GOAL #1   Title Pt will perform HEP with family's supervision for improved strength, balance, and gait.  TARGET 08/18/16   Time 4   Period Weeks   Status On-going     PT SHORT TERM GOAL #2   Title Pt will improve TUG score to less than or equal to 25 seconds for decreased fall risk.   Time 4   Period Weeks   Status On-going     PT SHORT TERM GOAL #3   Title Pt will improve Berg Balance score to at least 45/56 for decreased fall risk.    Time 4   Period Weeks   Status On-going     PT SHORT TERM GOAL #4   Title Pt will ambulate at least 300 ft using appropriate assistive device (RW versus cane) modified independently, for improved safety with gait.   Time 4   Period Weeks   Status On-going     PT SHORT TERM GOAL #5   Title Pt will negotiate at least 7 steps with one handrail, modified independently for improved safety with stair negotiation in the home.  Time 4   Period Weeks   Status On-going     PT SHORT TERM GOAL #6   Title Pt/family will verbalize understanding of CVA education, including warning signs and symptoms.   Time 4   Period Weeks   Status On-going           PT Long Term Goals - 08/02/16 1355      PT LONG TERM GOAL #1   Title Pt/family will verbalize understanding of fall prevention in the home environment.  TARGET 09/17/16   Time 8   Period Weeks   Status On-going     PT LONG TERM GOAL #2   Title Pt will improve TUG score to less than or equal to 22 seconds for decreased fall risk.   Time 8   Period Weeks   Status On-going     PT LONG TERM GOAL #3   Title Pt will improve gait velocity to at least 1.8 ft/sec for improved gait efficiency and safety.   Time 8   Period Weeks   Status On-going     PT LONG TERM GOAL #4   Title Pt will ambulate at least 50-100 ft, household distances, with cane, with supervision for improved household mobility.   Time 8   Period Weeks   Status On-going     PT LONG TERM GOAL #5   Title Pt will report at least 20% improvement in STroke IS mobiltiy scale on FOTO to demonstrate improved functional mobility.   Time 8   Period Weeks   Status On-going               Plan - 08/02/16 1343    Clinical Impression Statement Patient presents after several missed appointments due to illness (lung infection per pt) and inclimate weather (causing OP center closure). Patient remains at high fall risk, however he not only is not using his walker at home, he is  only using his cane "sometimes." Emphasized to pt/wife the incr risk of falls and need to use his cane. Session focused on intiating HEP for strengthening. Pt reported he was doing bil toe raises only. See pt instructions for additional HEP provided today.   Rehab Potential Good   PT Frequency 2x / week   PT Duration 8 weeks  plus eval   PT Treatment/Interventions ADLs/Self Care Home Management;Functional mobility training;Gait training;Stair training;Neuromuscular re-education;Patient/family education;Balance training;Therapeutic exercise;Therapeutic activities;Orthotic Fit/Training;DME Instruction   PT Next Visit Plan ? if pt brings Lt AFO (he does not wear it); gait training with cane or RW (although pt refusing RW); add balance to HEP   PT Home Exercise Plan SLS with heel raise; seated hip abdct with red band; sideways walking at counter   Consulted and Agree with Plan of Care Patient      Patient will benefit from skilled therapeutic intervention in order to improve the following deficits and impairments:  Abnormal gait, Decreased balance, Decreased mobility, Decreased safety awareness, Decreased strength, Difficulty walking, Decreased activity tolerance  Visit Diagnosis: Hemiplegia and hemiparesis following cerebral infarction affecting right non-dominant side (HCC)  Spastic hemiplegia of left dominant side due to other nontraumatic intracranial hemorrhage (HCC)  Other abnormalities of gait and mobility     Problem List Patient Active Problem List   Diagnosis Date Noted  . Urinary frequency 07/20/2016  . Subcortical infarction ([place]) 07/09/2016  . PFO (patent foramen ovale)   . History of CVA with residual deficit   . Benign essential HTN   . Non compliance  with medical treatment   . Hypokalemia   . Acute blood loss anemia   . Acute CVA (cerebrovascular accident) ([place]) 07/05/2016  . Embolic stroke involving left middle cerebral artery ([place])   . TIA (transient ischemic  attack) 07/03/2016  . Right sided weakness 07/03/2016  . Microscopic hematuria 04/03/2015  . Hyperlipidemia 04/03/2015  . Wears hearing aid 04/03/2015  . Changing skin lesion 04/03/2015  . Paresthesia and pain of left extremity 04/02/2015  . Encounter for health maintenance examination in adult 04/02/2015  . Vaccine counseling 04/02/2015  . Hernia of abdominal cavity 04/02/2015  . Impaired gait and mobility 04/02/2015  . Leg swelling 03/03/2015  . Essential hypertension 03/03/2015  . Spastic hemiplegia affecting nondominant side ([place]) 12/14/2013  . ICH (intracerebral hemorrhage) ([place]) 08/02/2013  . Hx of mitral valve repair   . History of stroke   . History of atrial fibrillation   . Cyst and pseudocyst of pancreas 09/13/2007  . PVC (premature ventricular contraction)   . History of renal cyst   . BPH (benign prostatic hypertrophy)     Rexanne Mano, PT 08/02/2016, 2:03 PM  [facility] [address] [place], [place], [address] Phone: [phone]   Fax:  [phone]  Name: RUBIN KAI MRN: [id number] Date of Birth: 1934/07/08

## 2016-08-02 NOTE — Patient Instructions (Signed)
Sideward Walking    Holding onto counter with both hands, walk sideways, both directions. BIG step out with side of one foot and slide other foot over to meet it.  Do 5 lengths each way (left and right).  Repeat every day, 5 days per week.    Copyright  VHI. All rights reserved.    Foot: Heel Raises    Standing at counter, pick up right foot standing only on left foot.  Lift heel off floor.  Hold ___2_ seconds. Repeat ___10_ times. Do __2__ sessions per day.  Do 5 days per week.  CAUTION: Repetitions should be slow and controlled.  Copyright  VHI. All rights reserved.   ABDUCTION: Sitting - Resistance Band (Active)    Sit with feet flat. Lift right leg slightly and, against red resistance band, draw it out to side. Complete __2_ sets of __10_ repetitions. Perform _2__ sessions per day.  Copyright  VHI. All rights reserved.

## 2016-08-02 NOTE — Therapy (Signed)
[facility] [address] [place], [place], [address] Phone: [phone]   Fax:  [phone]  Occupational Therapy Treatment  Patient Details  Name: Jack [name] MRN: [id number] Date of Birth: 1933/08/13 Referring Provider: Dr Alger Simons  Encounter Date: 08/02/2016      OT End of Session - 08/02/16 1206    Visit Number 2   Number of Visits 9   Date for OT Re-Evaluation 08/23/16   OT Start Time 1108   OT Stop Time 1147   OT Time Calculation (min) 39 min   Activity Tolerance Patient tolerated treatment well   Behavior During Therapy [facility] for tasks assessed/performed      Past Medical History:  Diagnosis Date  . Atrial fibrillation ([place])    Dr. Tollie Eth  . BPH (benign prostatic hypertrophy) 2012   prior therapy with finasteride, but as of 03/2015 no c/o; Dr. Luanne Bras  . Cellulitis of face 11/08   [facility]  . CVA (cerebral infarction) 2009, 2015  . Echocardiogram abnormal 02/11/2010   LVEF 70%, severe mitral regurgitation, pulm HTN   . ED (erectile dysfunction)    Viagra prn, consult with Urology 2012  . Elevated PSA 2012   BPH; Dr. Luanne Bras, [facility]; no more PSAs needed as of 2012 consult  . Gait abnormality    uses cane, s/p CVA  . Hemiparesis affecting left side as late effect of cerebrovascular accident ([place])   . Hiatal hernia    per 2008 EUS  . Hyperlipidemia   . Hypertension   . Microscopic hematuria    chronic, eval 2012 with Urology, benign at that time  . Obstructive uropathy 2012   Dr. Luanne Bras  . Pancreatic mass 01/2006   inconsequential per CT 2007  . 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 Patent foramen ovale    hx/o, notation of fistula on echocardiogram from 2009  . Prophylactic antibiotic 2016   requires due to valve disease  . Schatzki's ring 2008   per endoscopic ultrasound; Dr. Janetta Hora. Herbie Baltimore  Buccini  . Stroke ([place])   . Ventral hernia    surgery consult 01/2010 with Dr. Excell Seltzer, elective repair if desired , but he declined  . Wears hearing aid     Past Surgical History:  Procedure Laterality Date  . CARDIAC CATHETERIZATION  02/2010   LVEF 70%, severe mitral regurgitation, pulm HTN; prior in 01/2006  . INGUINAL HERNIA REPAIR     bilat  . MITRAL VALVE REPAIR  01/2006   mitral repair with Maze procedure, tricuspid annuloplasty, MAZE and LAA oversewn by Dr. Roxy Manns    There were no vitals filed for this visit.      Subjective Assessment - 08/02/16 1200    Subjective   Pt reports that his L arm is about the same    Patient is accompained by: Family member   Pertinent History Hemorrhagic stroke 2 years ago left spastic hemiplegia   Currently in Pain? No/denies          In standing, donning/doffing coat with close supervision for balance.  Pt leaned against table during task.  Recommended pt sit while dressing at home due to fall risk.  Pt/wife verbalized understanding.                     OT Education - 08/02/16 1131    Education Details Coordination HEP   Person(s) Educated Patient;Spouse  Methods Explanation;Demonstration;Handout   Comprehension Verbalized understanding;Returned demonstration;Verbal cues required             OT Long Term Goals - 07/19/16 1213      OT LONG TERM GOAL #1   Title Patient will safely complete shower stall transfer with modified independence   Time 4   Period Weeks   Status New     OT LONG TERM GOAL #2   Title Patient will don/doff jacket/ front openiong shirt in standing without assistance   Time 4   Period Weeks   Status New     OT LONG TERM GOAL #3   Title Patient will cut his own food during meals with modified independence   Time 4   Period Weeks   Status New     OT LONG TERM GOAL #4   Title Patient will demonstrate sufficient activity tolerance to grocery shop with wife using a cart,  and locating 30% needed items   Time 4   Period Weeks   Status New     OT LONG TERM GOAL #5   Title Patient will return to simple housekeeping with modified independence   Time 4   Period Weeks   Status New               Plan - 08/02/16 1112    Clinical Impression Statement Pt demo difficulty with donning jacket due to balance deficits.  Recommended dressing in sitting for incr safety due to fall risk.   Rehab Potential Good   OT Frequency 2x / week   OT Duration 4 weeks   OT Treatment/Interventions Self-care/ADL training;Therapeutic exercise;Neuromuscular education;DME and/or AE instruction;Manual Therapy;Therapist, nutritional;Therapeutic activities;Cognitive remediation/compensation;Visual/perceptual remediation/compensation;Patient/family education;Balance training   Plan MOCA, address ADLs/dynamic balance activities   Consulted and Agree with Plan of Care Patient;Family member/caregiver      Patient will benefit from skilled therapeutic intervention in order to improve the following deficits and impairments:  Decreased activity tolerance, Decreased balance, Decreased cognition, Decreased coordination, Decreased safety awareness, Impaired UE functional use, Impaired tone, Decreased range of motion, Difficulty walking, Impaired sensation, Impaired flexibility, Decreased strength, Impaired perceived functional ability, Improper body mechanics, Impaired vision/preception, Decreased endurance, Decreased knowledge of precautions, Decreased knowledge of use of DME, Decreased mobility  Visit Diagnosis: Hemiplegia and hemiparesis following cerebral infarction affecting right non-dominant side (HCC)  Muscle weakness (generalized)  Other lack of coordination  Other disturbances of skin sensation  Cognitive social or emotional deficit following unspecified cerebrovascular disease    Problem List Patient Active Problem List   Diagnosis Date Noted  . Urinary frequency  07/20/2016  . Subcortical infarction ([place]) 07/09/2016  . PFO (patent foramen ovale)   . History of CVA with residual deficit   . Benign essential HTN   . Non compliance with medical treatment   . Hypokalemia   . Acute blood loss anemia   . Acute CVA (cerebrovascular accident) ([place]) 07/05/2016  . 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 TIA (transient ischemic attack) 07/03/2016  . Right sided weakness 07/03/2016  . Microscopic hematuria 04/03/2015  . Hyperlipidemia 04/03/2015  . Wears hearing aid 04/03/2015  . Changing skin lesion 04/03/2015  . Paresthesia and pain of left extremity 04/02/2015  . Encounter for health maintenance examination in adult 04/02/2015  . Vaccine counseling 04/02/2015  . Hernia of abdominal cavity 04/02/2015  . Impaired gait and mobility 04/02/2015  . Leg swelling 03/03/2015  . Essential hypertension 03/03/2015  . Spastic hemiplegia affecting  nondominant side ([place]) 12/14/2013  . ICH (intracerebral hemorrhage) ([place]) 08/02/2013  . Hx of mitral valve repair   . History of stroke   . History of atrial fibrillation   . Cyst and pseudocyst of pancreas 09/13/2007  . PVC (premature ventricular contraction)   . History of renal cyst   . BPH (benign prostatic hypertrophy)     [facility] 08/02/2016, 12:07 PM  [facility] [address] [place], [place], [address] Phone: [phone]   Fax:  [phone]  Name: Jack [name] MRN: [id number] Date of Birth: 09/26/1933   Vianne Bulls, OTR/L [facility] [address] [place], [place]  [address] [phone] phone [phone] 08/02/16 12:07 PM

## 2016-08-02 NOTE — Patient Instructions (Signed)
  Coordination Activities  Perform the following activities for 15-20 minutes 1 times per day with both hand(s).   Rotate ball in fingertips (clockwise and counter-clockwise).  Flip cards 1 at a time as fast as you can.  Deal cards with your thumb (Hold deck in hand and push card off top with thumb).  Pick up coins and stack.  Pick up coins one at a time until you get 5-10 in your hand, then move coins from palm to fingertips to place in coin bank or container 1 at a time.  Practice writing.

## 2016-08-05 ENCOUNTER — Encounter: Payer: Self-pay | Admitting: Occupational Therapy

## 2016-08-05 ENCOUNTER — Ambulatory Visit: Payer: Medicare Other | Admitting: Occupational Therapy

## 2016-08-05 ENCOUNTER — Ambulatory Visit: Payer: Medicare Other | Admitting: Physical Therapy

## 2016-08-05 DIAGNOSIS — I69353 Hemiplegia and hemiparesis following cerebral infarction affecting right non-dominant side: Secondary | ICD-10-CM

## 2016-08-05 DIAGNOSIS — I629 Nontraumatic intracranial hemorrhage, unspecified: Secondary | ICD-10-CM

## 2016-08-05 DIAGNOSIS — R2689 Other abnormalities of gait and mobility: Secondary | ICD-10-CM | POA: Diagnosis not present

## 2016-08-05 DIAGNOSIS — R2681 Unsteadiness on feet: Secondary | ICD-10-CM

## 2016-08-05 DIAGNOSIS — R278 Other lack of coordination: Secondary | ICD-10-CM

## 2016-08-05 DIAGNOSIS — I69915 Cognitive social or emotional deficit following unspecified cerebrovascular disease: Secondary | ICD-10-CM

## 2016-08-05 DIAGNOSIS — M6281 Muscle weakness (generalized): Secondary | ICD-10-CM

## 2016-08-05 DIAGNOSIS — G8112 Spastic hemiplegia affecting left dominant side: Secondary | ICD-10-CM

## 2016-08-05 DIAGNOSIS — R208 Other disturbances of skin sensation: Secondary | ICD-10-CM

## 2016-08-05 NOTE — Therapy (Signed)
[facility] [address] [place], [place], [address] Phone: [phone]   Fax:  [phone]  Occupational Therapy Treatment  Patient Details  Name: Jack [name] MRN: [id number] Date of Birth: Apr 24, 1934 Referring Provider: Dr Alger Simons  Encounter Date: 08/05/2016      OT End of Session - 08/05/16 1739    Visit Number 3   Number of Visits 9   Date for OT Re-Evaluation 08/23/16   OT Start Time 1405   OT Stop Time 1445   OT Time Calculation (min) 40 min   Activity Tolerance Patient tolerated treatment well   Behavior During Therapy [facility] for tasks assessed/performed      Past Medical History:  Diagnosis Date  . Atrial fibrillation ([place])    Dr. Tollie Eth  . BPH (benign prostatic hypertrophy) 2012   prior therapy with finasteride, but as of 03/2015 no c/o; Dr. Luanne Bras  . Cellulitis of face 11/08   [facility]  . CVA (cerebral infarction) 2009, 2015  . Echocardiogram abnormal 02/11/2010   LVEF 70%, severe mitral regurgitation, pulm HTN   . ED (erectile dysfunction)    Viagra prn, consult with Urology 2012  . Elevated PSA 2012   BPH; Dr. Luanne Bras, [facility]; no more PSAs needed as of 2012 consult  . Gait abnormality    uses cane, s/p CVA  . Hemiparesis affecting left side as late effect of cerebrovascular accident ([place])   . Hiatal hernia    per 2008 EUS  . Hyperlipidemia   . Hypertension   . Microscopic hematuria    chronic, eval 2012 with Urology, benign at that time  . Obstructive uropathy 2012   Dr. Luanne Bras  . Pancreatic mass 01/2006   inconsequential per CT 2007  . 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 Patent foramen ovale    hx/o, notation of fistula on echocardiogram from 2009  . Prophylactic antibiotic 2016   requires due to valve disease  . Schatzki's ring 2008   per endoscopic ultrasound; Dr. Janetta Hora. Herbie Baltimore  Buccini  . Stroke ([place])   . Ventral hernia    surgery consult 01/2010 with Dr. Excell Seltzer, elective repair if desired , but he declined  . Wears hearing aid     Past Surgical History:  Procedure Laterality Date  . CARDIAC CATHETERIZATION  02/2010   LVEF 70%, severe mitral regurgitation, pulm HTN; prior in 01/2006  . INGUINAL HERNIA REPAIR     bilat  . MITRAL VALVE REPAIR  01/2006   mitral repair with Maze procedure, tricuspid annuloplasty, MAZE and LAA oversewn by Dr. Roxy Manns    There were no vitals filed for this visit.      Subjective Assessment - 08/05/16 1446    Subjective  "left handed"     Patient is accompained by: Family member   Pertinent History Hemorrhagic stroke 2 years ago left spastic hemiplegia   Currently in Pain? No/denies   Pain Score 0-No pain                      OT Treatments/Exercises (OP) - 08/05/16 0001      ADLs   UB Dressing Min assist to don jacket.  Difficulty locating left arm hole with left hand in standing- propped on stable surface to compensate for balance.      Cognitive Exercises   Other Cognitive Exercises 1 MOCA- 20/30- most challenged by memory, serial  subtraction, and fluency.       Fine Motor Coordination   Other Fine Motor Exercises Reviewed HEP for coordination                OT Education - 08/05/16 1739    Education provided Yes   Education Details Reviewed coordination HEP   Person(s) Educated Patient;Spouse   Methods Explanation;Demonstration   Comprehension Verbalized understanding;Returned demonstration             OT Long Term Goals - 08/05/16 1740      OT LONG TERM GOAL #1   Title Patient will safely complete shower stall transfer with modified independence   Status On-going     OT LONG TERM GOAL #2   Title Patient will don/doff jacket/ front openiong shirt in standing without assistance   Status On-going     OT LONG TERM GOAL #3   Title Patient will cut his own food during  meals with modified independence   Status On-going     OT LONG TERM GOAL #4   Title Patient will demonstrate sufficient activity tolerance to grocery shop with wife using a cart, and locating 30% needed items   Status On-going     OT LONG TERM GOAL #5   Title Patient will return to simple housekeeping with modified independence   Status On-going               Plan - 08/05/16 1739    Clinical Impression Statement Patient progressing toward OT goals.  Patient needs additional work on Dietitian for ADL   Rehab Potential Good   OT Frequency 2x / week   OT Duration 4 weeks   OT Treatment/Interventions Self-care/ADL training;Therapeutic exercise;Neuromuscular education;DME and/or AE instruction;Manual Therapy;Therapist, nutritional;Therapeutic activities;Cognitive remediation/compensation;Visual/perceptual remediation/compensation;Patient/family education;Balance training   Plan dynamic balance as related to ADL/IADL   Consulted and Agree with Plan of Care Patient;Family member/caregiver      Patient will benefit from skilled therapeutic intervention in order to improve the following deficits and impairments:  Decreased activity tolerance, Decreased balance, Decreased cognition, Decreased coordination, Decreased safety awareness, Impaired UE functional use, Impaired tone, Decreased range of motion, Difficulty walking, Impaired sensation, Impaired flexibility, Decreased strength, Impaired perceived functional ability, Improper body mechanics, Impaired vision/preception, Decreased endurance, Decreased knowledge of precautions, Decreased knowledge of use of DME, Decreased mobility  Visit Diagnosis: Hemiplegia and hemiparesis following cerebral infarction affecting right non-dominant side (HCC)  Muscle weakness (generalized)  Other lack of coordination  Other disturbances of skin sensation  Cognitive social or emotional deficit following unspecified cerebrovascular  disease  Spastic hemiplegia of left dominant side due to other nontraumatic intracranial hemorrhage (HCC)  Unsteadiness on feet    Problem List Patient Active Problem List   Diagnosis Date Noted  . Urinary frequency 07/20/2016  . Subcortical infarction ([place]) 07/09/2016  . PFO (patent foramen ovale)   . History of CVA with residual deficit   . Benign essential HTN   . Non compliance with medical treatment   . Hypokalemia   . Acute blood loss anemia   . Acute CVA (cerebrovascular accident) ([place]) 07/05/2016  . Embolic stroke involving left middle cerebral artery ([place])   . TIA (transient ischemic attack) 07/03/2016  . Right sided weakness 07/03/2016  . Microscopic hematuria 04/03/2015  . Hyperlipidemia 04/03/2015  . Wears hearing aid 04/03/2015  . Changing skin lesion 04/03/2015  . Paresthesia and pain of left extremity 04/02/2015  . Encounter for health maintenance examination in adult 04/02/2015  .  Vaccine counseling 04/02/2015  . Hernia of abdominal cavity 04/02/2015  . Impaired gait and mobility 04/02/2015  . Leg swelling 03/03/2015  . Essential hypertension 03/03/2015  . Spastic hemiplegia affecting nondominant side ([place]) 12/14/2013  . ICH (intracerebral hemorrhage) ([place]) 08/02/2013  . Hx of mitral valve repair   . History of stroke   . History of atrial fibrillation   . Cyst and pseudocyst of pancreas 09/13/2007  . PVC (premature ventricular contraction)   . History of renal cyst   . BPH (benign prostatic hypertrophy)     Mariah Milling, OTR/L 08/05/2016, 5:41 PM  [facility] [address] [place], [place], [address] Phone: [phone]   Fax:  [phone]  Name: Jack [name] MRN: [id number] Date of Birth: 10/22/1933

## 2016-08-05 NOTE — Therapy (Signed)
[facility] [address] [place], [place], [address] Phone: [phone]   Fax:  [phone]  Physical Therapy Treatment  Patient Details  Name: Jack [name] MRN: [id number] Date of Birth: [birth year] Referring Provider: Dr. Alger Simons  Encounter Date: 08/05/2016      PT End of Session - 08/05/16 2328    Visit Number 3   Number of Visits 17   Date for PT Re-Evaluation 09/17/16   Authorization Type [employer]-GCODE required every 10th visit   PT Start Time 1320   PT Stop Time 1405   PT Time Calculation (min) 45 min   Equipment Utilized During Treatment Gait belt   Activity Tolerance Patient tolerated treatment well   Behavior During Therapy [facility] for tasks assessed/performed      Past Medical History:  Diagnosis Date  . Atrial fibrillation ([place])    Dr. Tollie Eth  . BPH (benign prostatic hypertrophy) 2012   prior therapy with finasteride, but as of 03/2015 no c/o; Dr. Luanne Bras  . Cellulitis of face 11/08   [facility] ENT  . CVA (cerebral infarction) 2009, 2015  . Echocardiogram abnormal 02/11/2010   LVEF 70%, severe mitral regurgitation, pulm HTN   . ED (erectile dysfunction)    Viagra prn, consult with Urology 2012  . Elevated PSA 2012   BPH; Dr. Luanne Bras, [facility]; no more PSAs needed as of 2012 consult  . Gait abnormality    uses cane, s/p CVA  . Hemiparesis affecting left side as late effect of cerebrovascular accident ([place])   . Hiatal hernia    per 2008 EUS  . Hyperlipidemia   . Hypertension   . Microscopic hematuria    chronic, eval 2012 with Urology, benign at that time  . Obstructive uropathy 2012   Dr. Luanne Bras  . Pancreatic mass 01/2006   inconsequential per CT 2007  . 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 Patent foramen ovale    hx/o, notation of fistula on echocardiogram from 2009  . Prophylactic antibiotic 2016   requires due to valve disease  . Schatzki's ring 2008   per endoscopic ultrasound; Dr. Janetta Hora. [name]  . Stroke ([place])   . Ventral hernia    surgery consult 01/2010 with Dr. Excell Seltzer, elective repair if desired , but he declined  . Wears hearing aid     Past Surgical History:  Procedure Laterality Date  . CARDIAC CATHETERIZATION  02/2010   LVEF 70%, severe mitral regurgitation, pulm HTN; prior in 01/2006  . INGUINAL HERNIA REPAIR     bilat  . MITRAL VALVE REPAIR  01/2006   mitral repair with Maze procedure, tricuspid annuloplasty, MAZE and LAA oversewn by Dr. Roxy Manns    There were no vitals filed for this visit.      Subjective Assessment - 08/05/16 1324    Subjective per pt/wife they are not sure where pt's Lt AFO is. Still batttling lung infection +cough; has finished his antibiotics. Feels he is not back to his baseline as he still needs to walk with his cane ([facility].   Patient is accompained by: Family member  wife   Currently in Pain? No/denies                         [facility] Adult PT Treatment/Exercise - 08/05/16 0001  Transfers   Transfers Sit to Stand;Stand to Sit   Sit to Stand 6: Modified independent (Device/Increase time);Without upper extremity assist   Stand to Sit 6: Modified independent (Device/Increase time);Without upper extremity assist     Ambulation/Gait   Ambulation/Gait Yes   Ambulation/Gait Assistance 5: Supervision;4: Min guard   Ambulation/Gait Assistance Details vc for inproved heelstrike on Lt (with subsequent incr DF)   Ambulation Distance (Feet) 110 Feet  x3   Assistive device Straight cane;None   Gait Pattern Step-through pattern;Decreased step length - right;Decreased dorsiflexion - left;Decreased step length - left;Decreased weight shift to left;Left foot flat;Wide base of support;Poor foot clearance - left;Decreased arm swing - left;Decreased  stance time - left;Left steppage;Left flexed knee in stance;Lateral hip instability;Lateral trunk lean to left;Decreased trunk rotation   Ambulation Surface Level;Unlevel;Indoor;Other (comment)  compliant surface (red mat)     Exercises   Exercises Ankle     Knee/Hip Exercises: Aerobic   Tread Mill x 2.5 minutes; 0.73mph; bil UE support on front grips   Nustep x 3:00 minutes     Knee/Hip Exercises: Standing   Heel Raises Both;2 sets;10 reps  alternating with toe raises, single UE support   Forward Step Up Right;2 sets;Left;10 reps;Hand Hold: 1;Step Height: 4"     Ankle Exercises: Standing   Vector Stance Right;Left  10 reps each leg; tapping 2 cones placed midline & lateral   Toe Raise 10 reps  x 2 sets; on red mat, single UE support vs no UE             Balance Exercises - 08/05/16 2321      Balance Exercises: Standing   Retro Gait Foam/compliant surface;5 reps  length red mat   Sidestepping Foam/compliant support;Upper extremity support;5 reps  RUE support           PT Education - 08/05/16 2326    Education provided Yes   Education Details Safe use of aerobic machines at [employer], including wife accompanying patient to walk into exercise room and get onto recumbent bicycle.    Person(s) Educated Patient;Spouse   Methods Explanation;Demonstration   Comprehension Verbalized understanding;Need further instruction          PT Short Term Goals - 08/02/16 1354      PT SHORT TERM GOAL #1   Title Pt will perform HEP with family's supervision for improved strength, balance, and gait.  TARGET 08/18/16   Time 4   Period Weeks   Status On-going     PT SHORT TERM GOAL #2   Title Pt will improve TUG score to less than or equal to 25 seconds for decreased fall risk.   Time 4   Period Weeks   Status On-going     PT SHORT TERM GOAL #3   Title Pt will improve Berg Balance score to at least 45/56 for decreased fall risk.   Time 4   Period Weeks   Status On-going      PT SHORT TERM GOAL #4   Title Pt will ambulate at least 300 ft using appropriate assistive device (RW versus cane) modified independently, for improved safety with gait.   Time 4   Period Weeks   Status On-going     PT SHORT TERM GOAL #5   Title Pt will negotiate at least 7 steps with one handrail, modified independently for improved safety with stair negotiation in the home.   Time 4   Period Weeks   Status On-going  PT SHORT TERM GOAL #6   Title Pt/family will verbalize understanding of CVA education, including warning signs and symptoms.   Time 4   Period Weeks   Status On-going           PT Long Term Goals - 08/02/16 1355      PT LONG TERM GOAL #1   Title Pt/family will verbalize understanding of fall prevention in the home environment.  TARGET 09/17/16   Time 8   Period Weeks   Status On-going     PT LONG TERM GOAL #2   Title Pt will improve TUG score to less than or equal to 22 seconds for decreased fall risk.   Time 8   Period Weeks   Status On-going     PT LONG TERM GOAL #3   Title Pt will improve gait velocity to at least 1.8 ft/sec for improved gait efficiency and safety.   Time 8   Period Weeks   Status On-going     PT LONG TERM GOAL #4   Title Pt will ambulate at least 50-100 ft, household distances, with cane, with supervision for improved household mobility.   Time 8   Period Weeks   Status On-going     PT LONG TERM GOAL #5   Title Pt will report at least 20% improvement in STroke IS mobiltiy scale on FOTO to demonstrate improved functional mobility.   Time 8   Period Weeks   Status On-going               Plan - 08/05/16 2329    Clinical Impression Statement Patient eager to regain ability to walk without an assistive device to allow [facility] to [facility] as prior to recent CVA. Focus on gait and balance throughout session. Wife present and participates in discussion re: establishing pt's baseline and his  goals.    Rehab Potential Good   PT Frequency 2x / week   PT Duration 8 weeks   PT Treatment/Interventions ADLs/Self Care Home Management;Functional mobility training;Gait training;Stair training;Neuromuscular re-education;Patient/family education;Balance training;Therapeutic exercise;Therapeutic activities;Orthotic Fit/Training;DME Instruction   PT Next Visit Plan if pt brings Lt AFO (he does not wear it), assess gait with brace; gait training with cane or RW (although pt refusing RW); add balance to HEP; Rt hip strengthening   PT Home Exercise Plan SLS with heel raise; seated hip abdct with red band; sideways walking at counter   Consulted and Agree with Plan of Care Patient;Family member/caregiver      Patient will benefit from skilled therapeutic intervention in order to improve the following deficits and impairments:  Abnormal gait, Decreased balance, Decreased mobility, Decreased safety awareness, Decreased strength, Difficulty walking, Decreased activity tolerance  Visit Diagnosis: Hemiplegia and hemiparesis following cerebral infarction affecting right non-dominant side (HCC)  Unsteadiness on feet  Other abnormalities of gait and mobility     Problem List Patient Active Problem List   Diagnosis Date Noted  . Urinary frequency 07/20/2016  . Subcortical infarction ([place]) 07/09/2016  . PFO (patent foramen ovale)   . History of CVA with residual deficit   . Benign essential HTN   . Non compliance with medical treatment   . Hypokalemia   . Acute blood loss anemia   . Acute CVA (cerebrovascular accident) ([place]) 07/05/2016  . Embolic stroke involving left middle cerebral artery ([place])   . TIA (transient ischemic attack) 07/03/2016  . Right sided weakness 07/03/2016  . Microscopic hematuria 04/03/2015  .  Hyperlipidemia 04/03/2015  . Wears hearing aid 04/03/2015  . Changing skin lesion 04/03/2015  . Paresthesia and pain of left extremity 04/02/2015  . Encounter for health  maintenance examination in adult 04/02/2015  . Vaccine counseling 04/02/2015  . Hernia of abdominal cavity 04/02/2015  . Impaired gait and mobility 04/02/2015  . Leg swelling 03/03/2015  . Essential hypertension 03/03/2015  . Spastic hemiplegia affecting nondominant side ([place]) 12/14/2013  . ICH (intracerebral hemorrhage) ([place]) 08/02/2013  . Hx of mitral valve repair   . History of stroke   . History of atrial fibrillation   . Cyst and pseudocyst of pancreas 09/13/2007  . PVC (premature ventricular contraction)   . History of renal cyst   . BPH (benign prostatic hypertrophy)     Rexanne Mano, PT 08/05/2016, 11:39 PM  [facility] [address] [place], [place], [address] Phone: [phone]   Fax:  [phone]  Name: Jack [name] MRN: [id number] Date of Birth: October 06, 1933

## 2016-08-06 ENCOUNTER — Telehealth: Payer: Self-pay | Admitting: Nurse Practitioner

## 2016-08-06 ENCOUNTER — Ambulatory Visit (INDEPENDENT_AMBULATORY_CARE_PROVIDER_SITE_OTHER): Payer: Medicare Other | Admitting: Nurse Practitioner

## 2016-08-06 ENCOUNTER — Encounter: Payer: Self-pay | Admitting: Nurse Practitioner

## 2016-08-06 VITALS — BP 124/70 | HR 91 | Temp 98.4°F | Ht 62.0 in | Wt 149.2 lb

## 2016-08-06 DIAGNOSIS — R0602 Shortness of breath: Secondary | ICD-10-CM | POA: Diagnosis not present

## 2016-08-06 DIAGNOSIS — R05 Cough: Secondary | ICD-10-CM

## 2016-08-06 DIAGNOSIS — E876 Hypokalemia: Secondary | ICD-10-CM | POA: Diagnosis not present

## 2016-08-06 DIAGNOSIS — M7989 Other specified soft tissue disorders: Secondary | ICD-10-CM | POA: Diagnosis not present

## 2016-08-06 DIAGNOSIS — E785 Hyperlipidemia, unspecified: Secondary | ICD-10-CM | POA: Diagnosis not present

## 2016-08-06 DIAGNOSIS — R059 Cough, unspecified: Secondary | ICD-10-CM

## 2016-08-06 DIAGNOSIS — I10 Essential (primary) hypertension: Secondary | ICD-10-CM | POA: Diagnosis not present

## 2016-08-06 LAB — POCT EXHALED NITRIC OXIDE: FENO LEVEL (PPB): 15

## 2016-08-06 MED ORDER — OMEPRAZOLE 20 MG PO CPDR: 20.0000 mg | DELAYED_RELEASE_CAPSULE | Freq: Every day | ORAL | 0 refills | Status: DC

## 2016-08-06 MED ORDER — PRAVASTATIN SODIUM 40 MG PO TABS: 40.0000 mg | ORAL_TABLET | Freq: Every day | ORAL | 1 refills | Status: DC

## 2016-08-06 MED ORDER — UMECLIDINIUM-VILANTEROL 62.5-25 MCG/INH IN AEPB: 1.0000 | INHALATION_SPRAY | Freq: Every day | RESPIRATORY_TRACT | 0 refills | Status: DC

## 2016-08-06 MED ORDER — HYDROCHLOROTHIAZIDE 12.5 MG PO CAPS: 12.5000 mg | ORAL_CAPSULE | Freq: Every day | ORAL | 1 refills | Status: DC

## 2016-08-06 MED ORDER — POTASSIUM CHLORIDE ER 10 MEQ PO TBCR: 10.0000 meq | EXTENDED_RELEASE_TABLET | Freq: Every day | ORAL | 1 refills | Status: DC

## 2016-08-06 MED ORDER — ALBUTEROL SULFATE HFA 108 (90 BASE) MCG/ACT IN AERS: 2.0000 | INHALATION_SPRAY | Freq: Four times a day (QID) | RESPIRATORY_TRACT | 0 refills | Status: DC | PRN

## 2016-08-06 NOTE — Patient Instructions (Signed)
Wear compression stocking during the day and off at night.  Aspiration Precautions, Adult Aspiration is the breathing in (inhalation) of a liquid or object into the lungs. Things that can be inhaled into the lungs include:  Food.  Any type of liquid, such as drinks or saliva.  Stomach contents, such as vomit or stomach acid. What are the signs of aspiration? Signs of aspiration include:  Coughing after swallowing food or liquids.  Clearing the throat often while eating.  Trouble breathing. This may include:  Breathing quickly.  Breathing very slowly.  Loud breathing.  Rumbling sounds from the lungs while breathing.  Coughing up phlegm (sputum) that:  Is yellow, tan, or green.  Has pieces of food in it.  Is bad-smelling.  Having a hoarse, barky cough.  Not being able to speak.  A hoarse voice.  Drooling while eating.  A feeling of fullness in the throat or a feeling that something is stuck in the throat.  Choking often.  Having a runny noise while eating.  Coughing when lying down or having to sit up quickly after lying down.  A change in skin color. The skin may look red or blue.  Fever.  Watery eyes.  Pain in the chest or back.  A pained look on the face. What are the complications of aspiration? Complications of aspiration include:  Losing weight because the person is not absorbing needed nutrients.  Loss of enjoyment and the social benefits of eating.  Choking.  Lung irritation, if someone aspirates acidic food or drinks.  Lung infection (pneumonia).  Collection of infected liquid (pus) in the lungs (lung abscess). In serious cases, death can occur. What can I do to prevent aspiration? Caring for someone who has a feeding tube If you are caring for someone who has a feeding tube who cannot eat or drink safely through his or her mouth:  Keep the person in an upright position as much as possible.  Do not lay the person flat if he or  she is getting continuous feedings. If you need to lay the person flat for any reason, turn the feeding pump off.  Check feeding tube residuals as told by your health care provider. Ask your health care provider what residual amount is too high. Caring for someone who can eat and drink safely by mouth If you are caring for someone who can eat and drink safely through his or her mouth:  Have the person sit in an upright position when eating food or drinking fluids. This can be done in two ways:  Have the person sit up in a chair.  If sitting in a chair is not possible, position the person in bed so he or she is upright.  Remind the person to eat slowly and chew well. Make sure the person is awake and alert while eating.  Do not distract the person. This is especially important for people with thinking or memory (cognitive) problems.  Allow foods to cool. Hot foods may be more difficult to swallow.  Provide small meals more frequently, instead of 3 large meals. This may reduce fatigue during eating.  Check the person's mouth thoroughly for leftover food after eating.  Keep the person sitting upright for 30-45 minutes after eating.  Do not serve food or drink during 2 hours or more before bedtime. General instructions Follow these general guidelines to prevent aspiration in someone who can eat and drink safely by mouth:  Never put food or liquids in the  mouth of a person who is not fully alert.  Feed small amounts of food. Do not force feed.  For a person who is on a diet for swallowing difficulty (dysphagia diet), follow the recommended food and drink consistency. For example, in dysphagia diet level 1, thicken liquids to pudding-like consistency.  Use as little water as possible when brushing the person's teeth or cleaning his or her mouth.  Provide oral care before and after meals.  Use adaptive devices such as cut-out cups, straws, or utensils as told by the health care  provider.  Crush pills and put them in soft food such as pudding or ice cream. Some pills should not be crushed. Check with the health care provider before crushing any medicine. Contact a health care provider if:  The person has a feeding tube, and the feeding tube residual amount is too high.  The person has a fever.  The person tries to avoid food or water, such as refusing to eat, drink, or be fed, or is eating less than normal.  The person may have aspirated food or liquid.  You notice warning signs, such as choking or coughing, when the person eats or drinks. Get help right away if:  The person has trouble breathing or starts to breathe quickly.  The person is breathing very slowly or stops breathing.  The person coughs a lot after eating or drinking.  The person has a long-lasting (chronic) cough.  The person coughs up thick, yellow, or tan sputum.  If someone is choking on food or an object, perform the Heimlich maneuver (abdominal thrusts).  The person has symptoms of pneumonia, such as:  Coughing a lot.  Coughing up mucus with a bad smell or blood in it.  Feeling short of breath.  Complaining of chest pain.  Sweating, fever, and chills.  Feeling tired.  Complaining of trouble breathing.  Wheezing.  The person cannot stop choking.  The person is unable to breathe, turns blue, faints, or seems confused. These symptoms may represent a serious problem that is an emergency. Do not wait to see if the symptoms will go away. Get medical help right away. Call your local emergency services (911 in the [place]  Summary  Aspiration is the breathing in (inhalation) of a liquid or object into the lungs. Things that can be inhaled into the lungs include food, liquids, saliva, or stomach contents.  Aspiration can cause pneumonia or choking.  One sign of aspiration is coughing after swallowing food or liquids.  Contact a health care provider if you notice signs of  aspiration. This information is not intended to replace advice given to you by your health care provider. Make sure you discuss any questions you have with your health care provider. Document Released: 07/31/2010 Document Revised: 03/25/2016 Document Reviewed: 03/25/2016 Elsevier Interactive Patient Education  2017 [employer].

## 2016-08-06 NOTE — Telephone Encounter (Signed)
Pt daughter called in and said that pt needs urgent referral to Ortho for her need.  She has an appt for tues and they want urgent referral

## 2016-08-06 NOTE — Progress Notes (Signed)
Pre visit review using our clinic review tool, if applicable. No additional management support is needed unless otherwise documented below in the visit note. 

## 2016-08-06 NOTE — Progress Notes (Signed)
Reviewed with patient in office. See office note

## 2016-08-06 NOTE — Progress Notes (Signed)
Subjective:    Patient ID: Jack [name], male    DOB: 11-26-1933, 83 y.o.   MRN: [id number]  Patient presents today for establish care (new patient)  Cough  This is a new problem. The current episode started 1 to 4 weeks ago. The problem has been waxing and waning. The problem occurs constantly. The cough is non-productive. Associated symptoms include nasal congestion, postnasal drip, shortness of breath and wheezing. Pertinent negatives include no chest pain, fever, headaches, heartburn, myalgias, rash, sore throat or weight loss. The symptoms are aggravated by lying down and cold air. He has tried a beta-agonist inhaler (doxycycline and oral prednisone) for the symptoms. His past medical history is significant for bronchitis.  evaluated in hospital 07/27/16. Labs and radiology report reviewed.  HTN: Stable  Leg swelling: Chronic, Worse during the day and resolves with elevation.  CVA x 2 (123XX123 and 2017-embolic): Upcoming appt with Dr. Mamie Nick with [facility]. Ongoing PT and OT with Dr. Devra Dopp. Current use of ASA 325mg  only.  Immunizations: (TDAP, Hep C screen, Pneumovax, Influenza, zoster)  Health Maintenance  Topic Date Due  . OT PLAN OF CARE  20-May-1934  . Shingles Vaccine  01/13/1996  . Tetanus Vaccine  04/06/2017  . Flu Shot  Completed  . Pneumonia vaccines  Completed   Diet:heart healthy Weight:  Wt Readings from Last 3 Encounters:  08/06/16 149 lb 4 oz (67.7 kg)  07/27/16 140 lb (63.5 kg)  07/27/16 148 lb 9.6 oz (67.4 kg)   Exercise:current PT and OT Fall Risk: Fall Risk  07/27/2016 07/20/2016 02/24/2015 11/02/2013  Falls in the past year? No No No Yes  Number falls in past yr: - - - 2 or more  Risk Factor Category  - - - High Fall Risk  Risk for fall due to : - - - History of fall(s);Impaired balance/gait;Impaired mobility;Impaired vision;Medication side effect   Home Safety:home with wife Depression/Suicide: Depression screen [facility] 2/9 07/27/2016 07/20/2016  02/24/2015  Decreased Interest 0 0 0  Down, Depressed, Hopeless 0 0 0  PHQ - 2 Score 0 0 0  Altered sleeping - 0 -  Tired, decreased energy - 2 -  Change in appetite - 2 -  Feeling bad or failure about yourself  - 0 -  Trouble concentrating - 0 -  Moving slowly or fidgety/restless - 0 -  Suicidal thoughts - 0 -  PHQ-9 Score - 4 -   No flowsheet data found. PSA (yearly, >1yrs):needed Advanced Directive: Advanced Directives 07/27/2016  Does Patient Have a Medical Advance Directive? No  Type of Advance Directive Living will  Does patient want to make changes to medical advance directive? -  Copy of [place] in Chart? -  Would patient like information on creating a medical advance directive? No - Patient declined  Pre-existing out of facility DNR order (yellow form or pink MOST form) -    Medications and allergies reviewed with patient and updated if appropriate.  Patient Active Problem List   Diagnosis Date Noted  . Urinary frequency 07/20/2016  . Subcortical infarction ([place]) 07/09/2016  . PFO (patent foramen ovale)   . History of CVA with residual deficit   . Benign essential HTN   . Non compliance with medical treatment   . Hypokalemia   . Acute blood loss anemia   . Acute CVA (cerebrovascular accident) ([place]) 07/05/2016  . 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 TIA (transient ischemic attack) 07/03/2016  . Right  sided weakness 07/03/2016  . Microscopic hematuria 04/03/2015  . Hyperlipidemia 04/03/2015  . Wears hearing aid 04/03/2015  . Changing skin lesion 04/03/2015  . Paresthesia and pain of left extremity 04/02/2015  . Encounter for health maintenance examination in adult 04/02/2015  . Vaccine counseling 04/02/2015  . Hernia of abdominal cavity 04/02/2015  . Impaired gait and mobility 04/02/2015  . Leg swelling 03/03/2015  . Essential hypertension 03/03/2015  . Spastic hemiplegia affecting nondominant side ([place])  12/14/2013  . ICH (intracerebral hemorrhage) ([place]) 08/02/2013  . Hx of mitral valve repair   . History of stroke   . History of atrial fibrillation   . Cyst and pseudocyst of pancreas 09/13/2007  . PVC (premature ventricular contraction)   . History of renal cyst   . BPH (benign prostatic hypertrophy)     Current Outpatient Prescriptions on File Prior to Visit  Medication Sig Dispense Refill  . acetaminophen (TYLENOL) 325 MG tablet Take 1-2 tablets (325-650 mg total) by mouth every 4 (four) hours as needed for mild pain.    Marland Kitchen amLODipine (NORVASC) 10 MG tablet Take 1 tablet (10 mg total) by mouth daily. 90 tablet 3  . aspirin EC 325 MG EC tablet Take 1 tablet (325 mg total) by mouth daily. 30 tablet 0  . cholecalciferol (VITAMIN D) 400 units TABS tablet Take 400 Units by mouth daily.    Marland Kitchen MAGNESIUM PO Take 1 tablet by mouth daily.      No current facility-administered medications on file prior to visit.     Past Medical History:  Diagnosis Date  . Atrial fibrillation ([place])    Dr. Tollie Eth  . BPH (benign prostatic hypertrophy) 2012   prior therapy with finasteride, but as of 03/2015 no c/o; Dr. Luanne Bras  . Cellulitis of face 11/08   [facility]  . CVA (cerebral infarction) 2009, 2015  . Echocardiogram abnormal 02/11/2010   LVEF 70%, severe mitral regurgitation, pulm HTN   . ED (erectile dysfunction)    Viagra prn, consult with Urology 2012  . Elevated PSA 2012   BPH; Dr. Luanne Bras, [facility]; no more PSAs needed as of 2012 consult  . Gait abnormality    uses cane, s/p CVA  . Hemiparesis affecting left side as late effect of cerebrovascular accident ([place])   . Hiatal hernia    per 2008 EUS  . Hyperlipidemia   . Hypertension   . Microscopic hematuria    chronic, eval 2012 with Urology, benign at that time  . Obstructive uropathy 2012   Dr. Luanne Bras  . Pancreatic mass 01/2006   inconsequential per CT 2007  . Parapelvic renal cyst  01/2006   CT demonstrated inconsequential cyst  . Patent foramen ovale    hx/o, notation of fistula on echocardiogram from 2009  . Prophylactic antibiotic 2016   requires due to valve disease  . Schatzki's ring 2008   per endoscopic ultrasound; Dr. Janetta Hora. [name]  . Stroke ([place])   . Ventral hernia    surgery consult 01/2010 with Dr. Excell Seltzer, elective repair if desired , but he declined  . Wears hearing aid     Past Surgical History:  Procedure Laterality Date  . CARDIAC CATHETERIZATION  02/2010   LVEF 70%, severe mitral regurgitation, pulm HTN; prior in 01/2006  . INGUINAL HERNIA REPAIR     bilat  . MITRAL VALVE REPAIR  01/2006   mitral repair with Maze procedure, tricuspid annuloplasty, MAZE and LAA oversewn by Dr.  [place] History   Social History  . Marital status: Married    Spouse name: N/A  . Number of children: N/A  . Years of education: N/A   Social History Main Topics  . Smoking status: Never Smoker  . Smokeless tobacco: Never Used  . Alcohol use No  . Drug use: No  . Sexual activity: Not Asked   Other Topics Concern  . None   Social History Narrative   Married, exercises with stationary bike 5 days per week.   Eats healthy.  As of 03/2015    Family History  Problem Relation Age of Onset  . Family history unknown: Yes        Review of Systems  Constitutional: Negative for fever, malaise/fatigue and weight loss.  HENT: Positive for congestion and postnasal drip. Negative for sore throat.   Eyes:       Negative for visual changes  Respiratory: Positive for cough, sputum production, shortness of breath and wheezing.   Cardiovascular: Positive for leg swelling. Negative for chest pain, palpitations, claudication and PND.  Gastrointestinal: Negative for blood in stool, constipation, diarrhea and heartburn.  Genitourinary: Negative for dysuria, frequency and urgency.  Musculoskeletal: Negative for falls, joint pain and  myalgias.  Skin: Negative for rash.  Neurological: Positive for speech change and focal weakness. Negative for dizziness, sensory change and headaches.  Endo/Heme/Allergies: Does not bruise/bleed easily.  Psychiatric/Behavioral: Negative for depression, substance abuse and suicidal ideas. The patient is not nervous/anxious.     Objective:   Vitals:   08/06/16 1503  BP: 124/70  Pulse: 91  Temp: 98.4 F (36.9 C)    Body mass index is 27.3 kg/m.   Physical Examination:  Physical Exam  Constitutional: He is oriented to person, place, and time and well-developed, well-nourished, and in no distress. No distress.  HENT:  Right Ear: External ear normal.  Left Ear: External ear normal.  Nose: Nose normal.  Mouth/Throat: Oropharynx is clear and moist. No oropharyngeal exudate.  Eyes: Conjunctivae and EOM are normal. Pupils are equal, round, and reactive to light. No scleral icterus.  Neck: Normal range of motion. Neck supple.  Cardiovascular: Normal rate, regular rhythm and intact distal pulses.  Exam reveals no gallop.   Murmur heard. Pulmonary/Chest: Effort normal and breath sounds normal. He exhibits no tenderness.  Abdominal: Soft. Bowel sounds are normal. He exhibits no distension. There is no tenderness.  Musculoskeletal: Normal range of motion. He exhibits edema. He exhibits no tenderness.  Bilateral ankle edema (pitting).  Neurological: He is alert and oriented to person, place, and time. He displays weakness, facial asymmetry and abnormal speech. Gait abnormal.  Bilateral muscular strength 4/5. Slight right facial muscle droop.   Skin: Skin is warm and dry.  Psychiatric: Affect and judgment normal.    ASSESSMENT and PLAN:  Jack [name] was seen today for establish care.  Diagnoses and all orders for this visit:  Cough -     POCT EXHALED NITRIC OXIDE -     umeclidinium-vilanterol (ANORO ELLIPTA) 62.5-25 MCG/INH AEPB; Inhale 1 puff into the lungs daily. -     omeprazole  (PRILOSEC) 20 MG capsule; Take 1 capsule (20 mg total) by mouth daily. -     albuterol (PROVENTIL HFA;VENTOLIN HFA) 108 (90 Base) MCG/ACT inhaler; Inhale 2 puffs into the lungs every 6 (six) hours as needed for wheezing or shortness of breath.  SOB (shortness of breath) -     POCT EXHALED NITRIC OXIDE -  umeclidinium-vilanterol (ANORO ELLIPTA) 62.5-25 MCG/INH AEPB; Inhale 1 puff into the lungs daily. -     albuterol (PROVENTIL HFA;VENTOLIN HFA) 108 (90 Base) MCG/ACT inhaler; Inhale 2 puffs into the lungs every 6 (six) hours as needed for wheezing or shortness of breath.  Essential hypertension -     hydrochlorothiazide (MICROZIDE) 12.5 MG capsule; Take 1 capsule (12.5 mg total) by mouth daily.  Hypokalemia -     potassium chloride (K-DUR) 10 MEQ tablet; Take 1 tablet (10 mEq total) by mouth daily.  Leg swelling  Hyperlipidemia, unspecified hyperlipidemia type -     pravastatin (PRAVACHOL) 40 MG tablet; Take 1 tablet (40 mg total) by mouth daily at 6 PM.   No problem-specific Assessment & Plan notes found for this encounter.     Follow up: Return in about 4 weeks (around 09/03/2016) for cough and SOB. leg swelling, repeat BMP.  Wilfred Lacy, NP

## 2016-08-09 ENCOUNTER — Ambulatory Visit: Payer: Medicare Other | Admitting: Occupational Therapy

## 2016-08-09 ENCOUNTER — Ambulatory Visit: Payer: Medicare Other | Admitting: Physical Therapy

## 2016-08-09 DIAGNOSIS — R2681 Unsteadiness on feet: Secondary | ICD-10-CM

## 2016-08-09 DIAGNOSIS — M6281 Muscle weakness (generalized): Secondary | ICD-10-CM

## 2016-08-09 DIAGNOSIS — I629 Nontraumatic intracranial hemorrhage, unspecified: Secondary | ICD-10-CM

## 2016-08-09 DIAGNOSIS — I69353 Hemiplegia and hemiparesis following cerebral infarction affecting right non-dominant side: Secondary | ICD-10-CM

## 2016-08-09 DIAGNOSIS — R2689 Other abnormalities of gait and mobility: Secondary | ICD-10-CM

## 2016-08-09 DIAGNOSIS — G8112 Spastic hemiplegia affecting left dominant side: Secondary | ICD-10-CM

## 2016-08-09 NOTE — Therapy (Signed)
[facility] [address] [place], [place], [address] Phone: [phone]   Fax:  [phone]  Occupational Therapy Treatment  Patient Details  Name: Jack [name] MRN: [id number] Date of Birth: 1933-08-11 Referring Provider: Dr Jack [name]  Encounter Date: 08/09/2016      OT End of Session - 08/09/16 1148    Visit Number 4   Number of Visits 9   Date for OT Re-Evaluation 08/23/16   OT Start Time 1100   OT Stop Time 1140   OT Time Calculation (min) 40 min   Activity Tolerance Patient tolerated treatment well      Past Medical History:  Diagnosis Date  . Atrial fibrillation ([place])    Dr. Tollie [name]  . BPH (benign prostatic hypertrophy) 2012   prior therapy with finasteride, but as of 03/2015 no c/o; Dr. Luanne [name]  . Cellulitis of face 11/08   [facility]  . CVA (cerebral infarction) 2009, 2015  . Echocardiogram abnormal 02/11/2010   LVEF 70%, severe mitral regurgitation, pulm HTN   . ED (erectile dysfunction)    Viagra prn, consult with Urology 2012  . Elevated PSA 2012   BPH; Dr. Luanne [name], [facility]; no more PSAs needed as of 2012 consult  . Gait abnormality    uses cane, s/p CVA  . Hemiparesis affecting left side as late effect of cerebrovascular accident ([place])   . Hiatal hernia    per 2008 EUS  . Hyperlipidemia   . Hypertension   . Microscopic hematuria    chronic, eval 2012 with Urology, benign at that time  . Obstructive uropathy 2012   Dr. Luanne [name]  . Pancreatic mass 01/2006   inconsequential per CT 2007  . 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 Patent foramen ovale    hx/o, notation of fistula on echocardiogram from 2009  . Prophylactic antibiotic 2016   requires due to valve disease  . Schatzki's ring 2008   per endoscopic ultrasound; Dr. Janetta [name]. [name]  . Stroke ([place])   . Ventral hernia    surgery  consult 01/2010 with Dr. Excell [name], elective repair if desired , but he declined  . Wears hearing aid     Past Surgical History:  Procedure Laterality Date  . CARDIAC CATHETERIZATION  02/2010   LVEF 70%, severe mitral regurgitation, pulm HTN; prior in 01/2006  . INGUINAL HERNIA REPAIR     bilat  . MITRAL VALVE REPAIR  01/2006   mitral repair with Maze procedure, tricuspid annuloplasty, MAZE and LAA oversewn by Dr. Roxy [name]    There were no vitals filed for this visit.      Subjective Assessment - 08/09/16 1103    Patient is accompained by: Family member  WIFE   Pertinent History Hemorrhagic stroke 2 years ago left spastic hemiplegia   Currently in Pain? No/denies                      OT Treatments/Exercises (OP) - 08/09/16 0001      ADLs   Eating Simulated cutting food with shelf liner under plate to keep from sliding. Pt able to complete task   Functional Mobility dynamic standing balance to retrieve things in/out of cabinets and off floor bilateral sides. Pt also side stepping to Rt/Lt sides to place/retrieve items. Pt without LOB with CGA for safety. Pt noted to widen BOS   Home Maintenance Practiced sweeping with close sup.  Practiced getting things in/out of refrigerator and low cabinets in kitchen with one hand countertop support                     OT Long Term Goals - 08/09/16 1149      OT LONG TERM GOAL #1   Title Patient will safely complete shower stall transfer with modified independence   Status On-going     OT LONG TERM GOAL #2   Title Patient will don/doff jacket/ front openiong shirt in standing without assistance   Status On-going     OT LONG TERM GOAL #3   Title Patient will cut his own food during meals with modified independence   Status Achieved     OT LONG TERM GOAL #4   Title Patient will demonstrate sufficient activity tolerance to grocery shop with wife using a cart, and locating 30% needed items   Status On-going      OT LONG TERM GOAL #5   Title Patient will return to simple housekeeping with modified independence   Status On-going               Plan - 08/09/16 1149    Clinical Impression Statement Pt met LTG #3. Pt making progress towards remaining LTG's.    Rehab Potential Good   OT Frequency 2x / week   OT Duration 4 weeks   OT Treatment/Interventions Self-care/ADL training;Therapeutic exercise;Neuromuscular education;DME and/or AE instruction;Manual Therapy;Therapist, nutritional;Therapeutic activities;Cognitive remediation/compensation;Visual/perceptual remediation/compensation;Patient/family education;Balance training   Plan continue to address balance, coordination   Consulted and Agree with Plan of Care Patient;Family member/caregiver      Patient will benefit from skilled therapeutic intervention in order to improve the following deficits and impairments:  Decreased activity tolerance, Decreased balance, Decreased cognition, Decreased coordination, Decreased safety awareness, Impaired UE functional use, Impaired tone, Decreased range of motion, Difficulty walking, Impaired sensation, Impaired flexibility, Decreased strength, Impaired perceived functional ability, Improper body mechanics, Impaired vision/preception, Decreased endurance, Decreased knowledge of precautions, Decreased knowledge of use of DME, Decreased mobility  Visit Diagnosis: Unsteadiness on feet  Other abnormalities of gait and mobility  Hemiplegia and hemiparesis following cerebral infarction affecting right non-dominant side [facility])    Problem List Patient Active Problem List   Diagnosis Date Noted  . Urinary frequency 07/20/2016  . Subcortical infarction ([place]) 07/09/2016  . PFO (patent foramen ovale)   . History of CVA with residual deficit   . Benign essential HTN   . Non compliance with medical treatment   . Hypokalemia   . Acute blood loss anemia   . Acute CVA (cerebrovascular accident) ([place])  07/05/2016  . 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 TIA (transient ischemic attack) 07/03/2016  . Right sided weakness 07/03/2016  . Microscopic hematuria 04/03/2015  . Hyperlipidemia 04/03/2015  . Wears hearing aid 04/03/2015  . Changing skin lesion 04/03/2015  . Paresthesia and pain of left extremity 04/02/2015  . Encounter for health maintenance examination in adult 04/02/2015  . Vaccine counseling 04/02/2015  . Hernia of abdominal cavity 04/02/2015  . Impaired gait and mobility 04/02/2015  . Leg swelling 03/03/2015  . Essential hypertension 03/03/2015  . Spastic hemiplegia affecting nondominant side ([place]) 12/14/2013  . ICH (intracerebral hemorrhage) ([place]) 08/02/2013  . Hx of mitral valve repair   . History of stroke   . History of atrial fibrillation   . Cyst and pseudocyst of pancreas 09/13/2007  . PVC (premature ventricular contraction)   . History of  renal cyst   . BPH (benign prostatic hypertrophy)     Carey Bullocks, OTR/L 08/09/2016, 11:50 AM  [facility] [address] [place], [place], [address] Phone: [phone]   Fax:  [phone]  Name: Jack [name] MRN: [id number] Date of Birth: 12-15-1933

## 2016-08-09 NOTE — Patient Instructions (Signed)
ABDUCTION: Standing - Resistance Band (Active)    Stand, feet flat. Against yellow resistance band, lift right leg out to side. Complete __2_ sets of _10__ repetitions. Perform _1__ sessions per day.  http://gtsc.exer.us/116   Copyright  VHI. All rights reserved.

## 2016-08-09 NOTE — Therapy (Signed)
[place] [address] [place], [place], [address] Phone: [phone]   Fax:  [phone]  Physical Therapy Treatment  Patient Details  Name: Jack [name] MRN: [id number] Date of Birth: 1933-08-29 Referring Provider: Dr. Alger Simons  Encounter Date: 08/09/2016      PT End of Session - 08/09/16 2159    Visit Number 4   Number of Visits 17   Date for PT Re-Evaluation 09/17/16   Authorization Type UHC Medicare-GCODE required every 10th visit   PT Start Time 1150   PT Stop Time 1230   PT Time Calculation (min) 40 min   Equipment Utilized During Treatment Gait belt   Activity Tolerance Patient tolerated treatment well   Behavior During Therapy [name] for tasks assessed/performed      Past Medical History:  Diagnosis Date  . Atrial fibrillation ([place])    Dr. Tollie Eth  . BPH (benign prostatic hypertrophy) 2012   prior therapy with finasteride, but as of 03/2015 no c/o; Dr. Luanne Bras  . Cellulitis of face 11/08   [facility] ENT  . CVA (cerebral infarction) 2009, 2015  . Echocardiogram abnormal 02/11/2010   LVEF 70%, severe mitral regurgitation, pulm HTN   . ED (erectile dysfunction)    Viagra prn, consult with Urology 2012  . Elevated PSA 2012   BPH; Dr. Luanne Bras, [facility]; no more PSAs needed as of 2012 consult  . Gait abnormality    uses cane, s/p CVA  . Hemiparesis affecting left side as late effect of cerebrovascular accident ([place])   . Hiatal hernia    per 2008 EUS  . Hyperlipidemia   . Hypertension   . Microscopic hematuria    chronic, eval 2012 with Urology, benign at that time  . Obstructive uropathy 2012   Dr. Luanne Bras  . Pancreatic mass 01/2006   inconsequential per CT 2007  . 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 Patent foramen ovale    hx/o, notation of fistula on echocardiogram from 2009  . Prophylactic antibiotic 2016   requires due to valve disease  . Schatzki's ring 2008   per endoscopic ultrasound; Dr. Janetta Hora. Jack [name]  . Stroke ([place])   . Ventral hernia    surgery consult 01/2010 with Dr. Excell Seltzer, elective repair if desired , but he declined  . Wears hearing aid     Past Surgical History:  Procedure Laterality Date  . CARDIAC CATHETERIZATION  02/2010   LVEF 70%, severe mitral regurgitation, pulm HTN; prior in 01/2006  . INGUINAL HERNIA REPAIR     bilat  . MITRAL VALVE REPAIR  01/2006   mitral repair with Maze procedure, tricuspid annuloplasty, MAZE and LAA oversewn by Dr. Roxy Manns    There were no vitals filed for this visit.      Subjective Assessment - 08/09/16 1153    Subjective Forgot to look for his Lt AFO. Wife asked if they found it would he wear it and he said, "no."    Patient is accompained by: Family member  wife   Currently in Pain? No/denies                         [facility] Adult PT Treatment/Exercise - 08/09/16 2143      Transfers   Transfers Sit to Stand;Stand to Sit   Sit to Stand 6: Modified independent (Device/Increase time);Without upper extremity assist   Stand to Sit  6: Modified independent (Device/Increase time);Without upper extremity assist     Ambulation/Gait   Ambulation/Gait Yes   Ambulation/Gait Assistance 5: Supervision;4: Min guard   Ambulation/Gait Assistance Details vc for lt knee flexion followed by heelstrike; utilized blue shoe cover over toe of lt shoe to simulate "leather patch" to assist with decr episodes of catching Lt toe and causing LOB with variable results    Ambulation Distance (Feet) 240 Feet  110, 110   Assistive device Straight cane   Gait Pattern Step-through pattern;Decreased step length - right;Decreased dorsiflexion - left;Decreased step length - left;Decreased weight shift to left;Left foot flat;Wide base of support;Poor foot clearance - left;Decreased arm swing - left;Decreased stance time -  left;Left steppage;Left flexed knee in stance;Lateral hip instability;Lateral trunk lean to left;Decreased trunk rotation;Decreased hip/knee flexion - left   Ambulation Surface Level;Indoor   Stairs Yes   Stairs Assistance 5: Supervision   Stairs Assistance Details (indicate cue type and reason) for safety   Stair Management Technique One rail Right;Alternating pattern;Forwards;With cane   Number of Stairs 4  x 3   Height of Stairs 6   Pre-Gait Activities facing counter; stagger stance Lt foot in front; weight shift back onto RLE with lifting left toes/heel down; shifting forward onto LLE with hip and knee extension x 10 reps   Gait Comments Pre-gait--in // bars, Rt foot on 4" step and left foot on small ball vs round bolster; rolling left foot from heelstrike to point of toe-off and rolling back again; x 20 reps     Knee/Hip Exercises: Standing   Heel Raises Left;1 set;10 reps  bil UE support on counter   Hip ADduction --   Hip Abduction AROM;Both;10 reps  educated to have wife assist & use tband at ankles   Lateral Step Up Left;1 set;10 reps;Hand Hold: 2;Step Height: 4"   Gait Training sideways walking with large steps, toes pointing straight ahead, bil Ue support on counter                PT Education - 08/09/16 2158    Education provided Yes   Education Details Add standing hip abdct with theraband around ankles (may stop doing seated hip abdct); wife to assist don theraband for standing exercise   Person(s) Educated Patient;Spouse   Methods Explanation;Demonstration;Handout   Comprehension Verbalized understanding;Returned demonstration;Need further instruction          PT Short Term Goals - 08/02/16 1354      PT SHORT TERM GOAL #1   Title Pt will perform HEP with family's supervision for improved strength, balance, and gait.  TARGET 08/18/16   Time 4   Period Weeks   Status On-going     PT SHORT TERM GOAL #2   Title Pt will improve TUG score to less than or  equal to 25 seconds for decreased fall risk.   Time 4   Period Weeks   Status On-going     PT SHORT TERM GOAL #3   Title Pt will improve Berg Balance score to at least 45/56 for decreased fall risk.   Time 4   Period Weeks   Status On-going     PT SHORT TERM GOAL #4   Title Pt will ambulate at least 300 ft using appropriate assistive device (RW versus cane) modified independently, for improved safety with gait.   Time 4   Period Weeks   Status On-going     PT SHORT TERM GOAL #5   Title Pt will negotiate  at least 7 steps with one handrail, modified independently for improved safety with stair negotiation in the home.   Time 4   Period Weeks   Status On-going     PT SHORT TERM GOAL #6   Title Pt/family will verbalize understanding of CVA education, including warning signs and symptoms.   Time 4   Period Weeks   Status On-going           PT Long Term Goals - 08/02/16 1355      PT LONG TERM GOAL #1   Title Pt/family will verbalize understanding of fall prevention in the home environment.  TARGET 09/17/16   Time 8   Period Weeks   Status On-going     PT LONG TERM GOAL #2   Title Pt will improve TUG score to less than or equal to 22 seconds for decreased fall risk.   Time 8   Period Weeks   Status On-going     PT LONG TERM GOAL #3   Title Pt will improve gait velocity to at least 1.8 ft/sec for improved gait efficiency and safety.   Time 8   Period Weeks   Status On-going     PT LONG TERM GOAL #4   Title Pt will ambulate at least 50-100 ft, household distances, with cane, with supervision for improved household mobility.   Time 8   Period Weeks   Status On-going     PT LONG TERM GOAL #5   Title Pt will report at least 20% improvement in STroke IS mobiltiy scale on FOTO to demonstrate improved functional mobility.   Time 8   Period Weeks   Status On-going               Plan - 08/09/16 2200    Clinical Impression Statement Patient continues with  increased fall risk due to previous Rt CVA with Lt sided weakness now complicated by Lt CVA and right sided weakness. Gait continues to improve, however patient continues to catch Lt foot as it drags. He continues to resist use of Lt AFO (reports he cannot find his AFO to bring for assessment). May benefit from shoe modification with leather toe cap. Will continue to assess.    Rehab Potential Good   PT Frequency 2x / week   PT Duration 8 weeks   PT Treatment/Interventions ADLs/Self Care Home Management;Functional mobility training;Gait training;Stair training;Neuromuscular re-education;Patient/family education;Balance training;Therapeutic exercise;Therapeutic activities;Orthotic Fit/Training;DME Instruction   PT Next Visit Plan gait training with & without cane; blue bootie over Lt toe of shoe (simulate leather toe patch for Lt foot dragging); bil hip strengthening   PT Home Exercise Plan SLS with heel raise; seated hip abdct with red band; sideways walking at counter; standing hip abdct with red theraband    Consulted and Agree with Plan of Care Patient;Family member/caregiver      Patient will benefit from skilled therapeutic intervention in order to improve the following deficits and impairments:  Abnormal gait, Decreased balance, Decreased mobility, Decreased safety awareness, Decreased strength, Difficulty walking, Decreased activity tolerance  Visit Diagnosis: Hemiplegia and hemiparesis following cerebral infarction affecting right non-dominant side (HCC)  Muscle weakness (generalized)  Spastic hemiplegia of left dominant side due to other nontraumatic intracranial hemorrhage (HCC)  Other abnormalities of gait and mobility     Problem List Patient Active Problem List   Diagnosis Date Noted  . Urinary frequency 07/20/2016  . Subcortical infarction ([place]) 07/09/2016  . PFO (patent foramen ovale)   . History  of CVA with residual deficit   . Benign essential HTN   . Non compliance  with medical treatment   . Hypokalemia   . Acute blood loss anemia   . Acute CVA (cerebrovascular accident) ([place]) 07/05/2016  . 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 TIA (transient ischemic attack) 07/03/2016  . Right sided weakness 07/03/2016  . Microscopic hematuria 04/03/2015  . Hyperlipidemia 04/03/2015  . Wears hearing aid 04/03/2015  . Changing skin lesion 04/03/2015  . Paresthesia and pain of left extremity 04/02/2015  . Encounter for health maintenance examination in adult 04/02/2015  . Vaccine counseling 04/02/2015  . Hernia of abdominal cavity 04/02/2015  . Impaired gait and mobility 04/02/2015  . Leg swelling 03/03/2015  . Essential hypertension 03/03/2015  . Spastic hemiplegia affecting nondominant side ([place]) 12/14/2013  . ICH (intracerebral hemorrhage) ([place]) 08/02/2013  . Hx of mitral valve repair   . History of stroke   . History of atrial fibrillation   . Cyst and pseudocyst of pancreas 09/13/2007  . PVC (premature ventricular contraction)   . History of renal cyst   . BPH (benign prostatic hypertrophy)     Rexanne Mano, PT 08/09/2016, 10:19 PM  [place] [address] [place], [place], [address] Phone: [phone]   Fax:  [phone]  Name: Jack [name] MRN: [id number] Date of Birth: Feb 26, 1934

## 2016-08-10 NOTE — Telephone Encounter (Signed)
Called patient.  Spoke with wife and patient.  They do not have a daughter and he is not having trouble with his knee.  Please disregard.

## 2016-08-10 NOTE — Telephone Encounter (Signed)
This patient needs an office visit to determine need for referral to ortho. Thank you

## 2016-08-10 NOTE — Telephone Encounter (Signed)
Please help call pt.  

## 2016-08-12 ENCOUNTER — Ambulatory Visit: Payer: Medicare Other | Admitting: Occupational Therapy

## 2016-08-12 ENCOUNTER — Ambulatory Visit: Payer: Medicare Other | Attending: Physical Medicine & Rehabilitation | Admitting: Physical Therapy

## 2016-08-12 ENCOUNTER — Encounter: Payer: Self-pay | Admitting: Physical Therapy

## 2016-08-12 VITALS — BP 120/76

## 2016-08-12 DIAGNOSIS — R2681 Unsteadiness on feet: Secondary | ICD-10-CM

## 2016-08-12 DIAGNOSIS — R278 Other lack of coordination: Secondary | ICD-10-CM | POA: Insufficient documentation

## 2016-08-12 DIAGNOSIS — M6281 Muscle weakness (generalized): Secondary | ICD-10-CM

## 2016-08-12 DIAGNOSIS — R2689 Other abnormalities of gait and mobility: Secondary | ICD-10-CM

## 2016-08-12 DIAGNOSIS — I69353 Hemiplegia and hemiparesis following cerebral infarction affecting right non-dominant side: Secondary | ICD-10-CM | POA: Diagnosis present

## 2016-08-12 NOTE — Therapy (Signed)
[place] [address] [place], [place], [address] Phone: [phone]   Fax:  [phone]  Physical Therapy Treatment  Patient Details  Name: Jack [name] MRN: [id number] Date of Birth: February 15, 1934 Referring Provider: Dr. Alger Simons  Encounter Date: 08/12/2016      PT End of Session - 08/12/16 1334    Visit Number 5   Number of Visits 17   Date for PT Re-Evaluation 09/17/16   Authorization Type UHC Medicare-GCODE required every 10th visit   PT Start Time 1330  pt late for appt today   PT Stop Time 1400   PT Time Calculation (min) 30 min   Equipment Utilized During Treatment Gait belt   Activity Tolerance Patient tolerated treatment well   Behavior During Therapy [facility] for tasks assessed/performed      Past Medical History:  Diagnosis Date  . Atrial fibrillation ([place])    Dr. Tollie Eth  . BPH (benign prostatic hypertrophy) 2012   prior therapy with finasteride, but as of 03/2015 no c/o; Dr. Luanne Bras  . Cellulitis of face 11/08   [facility] ENT  . CVA (cerebral infarction) 2009, 2015  . Echocardiogram abnormal 02/11/2010   LVEF 70%, severe mitral regurgitation, pulm HTN   . ED (erectile dysfunction)    Viagra prn, consult with Urology 2012  . Elevated PSA 2012   BPH; Dr. Luanne Bras, [facility]; no more PSAs needed as of 2012 consult  . Gait abnormality    uses cane, s/p CVA  . Hemiparesis affecting left side as late effect of cerebrovascular accident ([place])   . Hiatal hernia    per 2008 EUS  . Hyperlipidemia   . Hypertension   . Microscopic hematuria    chronic, eval 2012 with Urology, benign at that time  . Obstructive uropathy 2012   Dr. Luanne Bras  . Pancreatic mass 01/2006   inconsequential per CT 2007  . 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 Patent foramen ovale    hx/o, notation of fistula on echocardiogram from 2009  .  Prophylactic antibiotic 2016   requires due to valve disease  . Schatzki's ring 2008   per endoscopic ultrasound; Dr. Janetta Hora. [name]  . Stroke ([place])   . Ventral hernia    surgery consult 01/2010 with Dr. Excell Seltzer, elective repair if desired , but he declined  . Wears hearing aid     Past Surgical History:  Procedure Laterality Date  . CARDIAC CATHETERIZATION  02/2010   LVEF 70%, severe mitral regurgitation, pulm HTN; prior in 01/2006  . INGUINAL HERNIA REPAIR     bilat  . MITRAL VALVE REPAIR  01/2006   mitral repair with Maze procedure, tricuspid annuloplasty, MAZE and LAA oversewn by Dr. Roxy Manns    Vitals:   08/12/16 1333  BP: 120/76        Subjective Assessment - 08/12/16 1333    Subjective Found his brace, just forgot to bring it in because he was running late. No falls or pain to report.    Patient is accompained by: Family member   Pertinent History old Rt CVA with Lt spastic hemiparesis; new Lt CVA with Rt hemiparesis   Patient Stated Goals walking and balance to improve   Currently in Pain? No/denies   Pain Score 0-No pain             [facility] Adult PT Treatment/Exercise - 08/12/16 1339  Transfers   Transfers Sit to Stand;Stand to Sit   Sit to Stand 6: Modified independent (Device/Increase time);With upper extremity assist;From bed;From chair/3-in-1   Stand to Sit 6: Modified independent (Device/Increase time);Without upper extremity assist;To chair/3-in-1;To bed;Uncontrolled descent  uncontrolled descent at times     Ambulation/Gait   Ambulation/Gait Yes   Ambulation/Gait Assistance 5: Supervision   Ambulation/Gait Assistance Details 1st gait: with foot up brace only with good clearance until pt was fatigued and then toe scuffing noted (~ last 75 feet) with increased cues needed for foot clearance with gait. 2cd gait: with foot up and simulated toe cap combined with no toe scuffing noted, even with LE fatigue. cues needed for posture  and step length with both gait reps.                       Ambulation Distance (Feet) 230 Feet  x1, 345 x1   Assistive device Straight cane   Gait Pattern Step-through pattern;Decreased step length - right;Decreased dorsiflexion - left;Decreased step length - left;Decreased weight shift to left;Left foot flat;Wide base of support;Poor foot clearance - left;Decreased arm swing - left;Decreased stance time - left;Left steppage;Left flexed knee in stance;Lateral hip instability;Lateral trunk lean to left;Decreased trunk rotation;Decreased hip/knee flexion - left   Ambulation Surface Level;Indoor            PT Short Term Goals - 08/02/16 1354      PT SHORT TERM GOAL #1   Title Pt will perform HEP with family's supervision for improved strength, balance, and gait.  TARGET 08/18/16   Time 4   Period Weeks   Status On-going     PT SHORT TERM GOAL #2   Title Pt will improve TUG score to less than or equal to 25 seconds for decreased fall risk.   Time 4   Period Weeks   Status On-going     PT SHORT TERM GOAL #3   Title Pt will improve Berg Balance score to at least 45/56 for decreased fall risk.   Time 4   Period Weeks   Status On-going     PT SHORT TERM GOAL #4   Title Pt will ambulate at least 300 ft using appropriate assistive device (RW versus cane) modified independently, for improved safety with gait.   Time 4   Period Weeks   Status On-going     PT SHORT TERM GOAL #5   Title Pt will negotiate at least 7 steps with one handrail, modified independently for improved safety with stair negotiation in the home.   Time 4   Period Weeks   Status On-going     PT SHORT TERM GOAL #6   Title Pt/family will verbalize understanding of CVA education, including warning signs and symptoms.   Time 4   Period Weeks   Status On-going           PT Long Term Goals - 08/02/16 1355      PT LONG TERM GOAL #1   Title Pt/family will verbalize understanding of fall prevention in the home  environment.  TARGET 09/17/16   Time 8   Period Weeks   Status On-going     PT LONG TERM GOAL #2   Title Pt will improve TUG score to less than or equal to 22 seconds for decreased fall risk.   Time 8   Period Weeks   Status On-going     PT LONG TERM GOAL #3   Title Pt  will improve gait velocity to at least 1.8 ft/sec for improved gait efficiency and safety.   Time 8   Period Weeks   Status On-going     PT LONG TERM GOAL #4   Title Pt will ambulate at least 50-100 ft, household distances, with cane, with supervision for improved household mobility.   Time 8   Period Weeks   Status On-going     PT LONG TERM GOAL #5   Title Pt will report at least 20% improvement in STroke IS mobiltiy scale on FOTO to demonstrate improved functional mobility.   Time 8   Period Weeks   Status On-going            Plan - 08/12/16 1335    Clinical Impression Statement With today's skilled session continued to address gait with emphasis on increased left foot clearance with swing phase. Best results were with use of foot up brace with simulated toe cap. Pt agreed that this combination did make it easier to advance his foot and he would discuss it with his spouse who was not here today. Pt was shown another pt's shoe with leather toe cap so he would have a better understanding. Pt also provided handouts for foot up brace ordering on [place] for his spouse to look at. Pt is making steady progress toward goals and should benefit from continued PT to progress toward unmet goals.                         Rehab Potential Good   PT Frequency 2x / week   PT Duration 8 weeks   PT Treatment/Interventions ADLs/Self Care Home Management;Functional mobility training;Gait training;Stair training;Neuromuscular re-education;Patient/family education;Balance training;Therapeutic exercise;Therapeutic activities;Orthotic Fit/Training;DME Instruction   PT Next Visit Plan gait training with & without cane; blue bootie over  Lt toe of shoe (simulate leather toe patch for Lt foot dragging) with foot up brace to reinforce it's effects with gait; bil hip strengthening   PT Home Exercise Plan SLS with heel raise; seated hip abdct with red band; sideways walking at counter; standing hip abdct with red theraband    Consulted and Agree with Plan of Care Patient;Family member/caregiver      Patient will benefit from skilled therapeutic intervention in order to improve the following deficits and impairments:  Abnormal gait, Decreased balance, Decreased mobility, Decreased safety awareness, Decreased strength, Difficulty walking, Decreased activity tolerance  Visit Diagnosis: Unsteadiness on feet  Other abnormalities of gait and mobility  Hemiplegia and hemiparesis following cerebral infarction affecting right non-dominant side (HCC)  Muscle weakness (generalized)     Problem List Patient Active Problem List   Diagnosis Date Noted  . Urinary frequency 07/20/2016  . Subcortical infarction ([place]) 07/09/2016  . PFO (patent foramen ovale)   . History of CVA with residual deficit   . Benign essential HTN   . Non compliance with medical treatment   . Hypokalemia   . Acute blood loss anemia   . Acute CVA (cerebrovascular accident) ([place]) 07/05/2016  . 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 TIA (transient ischemic attack) 07/03/2016  . Right sided weakness 07/03/2016  . Microscopic hematuria 04/03/2015  . Hyperlipidemia 04/03/2015  . Wears hearing aid 04/03/2015  . Changing skin lesion 04/03/2015  . Paresthesia and pain of left extremity 04/02/2015  . Encounter for health maintenance examination in adult 04/02/2015  . Vaccine counseling 04/02/2015  . Hernia of abdominal cavity 04/02/2015  . Impaired  gait and mobility 04/02/2015  . Leg swelling 03/03/2015  . Essential hypertension 03/03/2015  . Spastic hemiplegia affecting nondominant side ([place]) 12/14/2013  . ICH (intracerebral  hemorrhage) ([place]) 08/02/2013  . Hx of mitral valve repair   . History of stroke   . History of atrial fibrillation   . Cyst and pseudocyst of pancreas 09/13/2007  . PVC (premature ventricular contraction)   . History of renal cyst   . BPH (benign prostatic hypertrophy)     Jack [name], Jack [name], [facility] [address], [place], [place] [address] [phone] 08/13/16, 10:37 AM  Name: ZYLEN HOGANSON MRN: [id number] Date of Birth: 1933/08/16

## 2016-08-12 NOTE — Therapy (Signed)
[facility] [address] [place], [place], [address] Phone: [phone]   Fax:  [phone]  Occupational Therapy Treatment  Patient Details  Name: Jack [name] MRN: [id number] Date of Birth: 1934/04/23 Referring Provider: Dr Faith Rogue  Encounter Date: 08/12/2016      OT End of Session - 08/12/16 1520    Visit Number 5   Number of Visits 9   Date for OT Re-Evaluation 08/23/16   Authorization Type U[employer] MCR   OT Start Time 1400   OT Stop Time 1445   OT Time Calculation (min) 45 min   Activity Tolerance Patient tolerated treatment well      Past Medical History:  Diagnosis Date  . Atrial fibrillation (HCC)    Dr. Viann Fish  . BPH (benign prostatic hypertrophy) 2012   prior therapy with finasteride, but as of 03/2015 no c/o; Dr. Vic Blackbird  . Cellulitis of face 11/08   [facility]  . CVA (cerebral infarction) 2009, 2015  . Echocardiogram abnormal 02/11/2010   LVEF 70%, severe mitral regurgitation, pulm HTN   . ED (erectile dysfunction)    Viagra prn, consult with Urology 2012  . Elevated PSA 2012   BPH; Dr. Vic Blackbird, [facility]; no more PSAs needed as of 2012 consult  . Gait abnormality    uses cane, s/p CVA  . Hemiparesis affecting left side as late effect of cerebrovascular accident (HCC)   . Hiatal hernia    per 2008 EUS  . Hyperlipidemia   . Hypertension   . Microscopic hematuria    chronic, eval 2012 with Urology, benign at that time  . Obstructive uropathy 2012   Dr. Vic Blackbird  . Pancreatic mass 01/2006   inconsequential per CT 2007  . 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 Patent foramen ovale    hx/o, notation of fistula on echocardiogram from 2009  . Prophylactic antibiotic 2016   requires due to valve disease  . Schatzki's ring 2008   per endoscopic ultrasound; Dr. Alexis Goodell. Molly Maduro Buccini  . Stroke (HCC)   .  Ventral hernia    surgery consult 01/2010 with Dr. Glenna Fellows, elective repair if desired , but he declined  . Wears hearing aid     Past Surgical History:  Procedure Laterality Date  . CARDIAC CATHETERIZATION  02/2010   LVEF 70%, severe mitral regurgitation, pulm HTN; prior in 01/2006  . INGUINAL HERNIA REPAIR     bilat  . MITRAL VALVE REPAIR  01/2006   mitral repair with Maze procedure, tricuspid annuloplasty, MAZE and LAA oversewn by Dr. Cornelius Moras    There were no vitals filed for this visit.      Subjective Assessment - 08/12/16 1404    Subjective  I'm left handed. My first stroke affected my Lt side, but this recent affected my RT side   Pertinent History Hemorrhagic stroke 2 years ago left spastic hemiplegia   Currently in Pain? No/denies                      OT Treatments/Exercises (OP) - 08/12/16 1515      ADLs   Functional Mobility Simulated shower transfer: pt able to step over threshold without LOB, but somewhat unsteady and recommended installing grab bar in shower. Also recommended using shower seat when bathing lower legs/feet and shampooing/rinsing hair to prevent falls   ADL Comments Begain assessing LTG's and progress with pt/family. Pt/family  encouraged to work on remaining LTG's at home over weekend      Fine Motor Coordination   Other Fine Motor Exercises Pt placing small pegs in pegboard Rt and Lt hand (with mod drops Lt hand) on vertical surface while standing with weight shifts to Rt and Lt during ipsilateral and contralateral reaching bilaterally. Pt performed with close sup, but no LOB                     OT Long Term Goals - 08/12/16 1520      OT LONG TERM GOAL #1   Title Patient will safely complete shower stall transfer with modified independence   Status Achieved  Recommend grab bar and using shower seat for fall prevention     OT LONG TERM GOAL #2   Title Patient will don/doff jacket/ front openiong shirt in standing  without assistance   Status Achieved     OT LONG TERM GOAL #3   Title Patient will cut his own food during meals with modified independence   Status Achieved     OT LONG TERM GOAL #4   Title Patient will demonstrate sufficient activity tolerance to grocery shop with wife using a cart, and locating 30% needed items   Status On-going     OT LONG TERM GOAL #5   Title Patient will return to simple housekeeping with modified independence   Status On-going               Plan - 08/12/16 1522    Clinical Impression Statement Pt met LTG'S #1-3. Approximating remaining goals   Rehab Potential Good   OT Frequency 2x / week   OT Duration 4 weeks   OT Treatment/Interventions Self-care/ADL training;Therapeutic exercise;Neuromuscular education;DME and/or AE instruction;Manual Therapy;Therapist, nutritional;Therapeutic activities;Cognitive remediation/compensation;Visual/perceptual remediation/compensation;Patient/family education;Balance training   Plan continue progress towards remaining goals and coordination bilaterally, anticipate d/c by end of next week   Consulted and Agree with Plan of Care Patient;Family member/caregiver   Family Member Consulted wife      Patient will benefit from skilled therapeutic intervention in order to improve the following deficits and impairments:  Decreased activity tolerance, Decreased balance, Decreased cognition, Decreased coordination, Decreased safety awareness, Impaired UE functional use, Impaired tone, Decreased range of motion, Difficulty walking, Impaired sensation, Impaired flexibility, Decreased strength, Impaired perceived functional ability, Improper body mechanics, Impaired vision/preception, Decreased endurance, Decreased knowledge of precautions, Decreased knowledge of use of DME, Decreased mobility  Visit Diagnosis: Unsteadiness on feet  Other abnormalities of gait and mobility  Hemiplegia and hemiparesis following cerebral  infarction affecting right non-dominant side (HCC)  Other lack of coordination    Problem List Patient Active Problem List   Diagnosis Date Noted  . Urinary frequency 07/20/2016  . Subcortical infarction ([place]) 07/09/2016  . PFO (patent foramen ovale)   . History of CVA with residual deficit   . Benign essential HTN   . Non compliance with medical treatment   . Hypokalemia   . Acute blood loss anemia   . Acute CVA (cerebrovascular accident) ([place]) 07/05/2016  . 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 TIA (transient ischemic attack) 07/03/2016  . Right sided weakness 07/03/2016  . Microscopic hematuria 04/03/2015  . Hyperlipidemia 04/03/2015  . Wears hearing aid 04/03/2015  . Changing skin lesion 04/03/2015  . Paresthesia and pain of left extremity 04/02/2015  . Encounter for health maintenance examination in adult 04/02/2015  . Vaccine counseling 04/02/2015  . Hernia  of abdominal cavity 04/02/2015  . Impaired gait and mobility 04/02/2015  . Leg swelling 03/03/2015  . Essential hypertension 03/03/2015  . Spastic hemiplegia affecting nondominant side ([place]) 12/14/2013  . ICH (intracerebral hemorrhage) ([place]) 08/02/2013  . Hx of mitral valve repair   . History of stroke   . History of atrial fibrillation   . Cyst and pseudocyst of pancreas 09/13/2007  . PVC (premature ventricular contraction)   . History of renal cyst   . BPH (benign prostatic hypertrophy)     Carey Bullocks, OTR/L 08/12/2016, 3:23 PM  [facility] [address] [place], [place], [address] Phone: [phone]   Fax:  [phone]  Name: Jack [name] MRN: [id number] Date of Birth: October 03, 1933

## 2016-08-17 ENCOUNTER — Ambulatory Visit: Payer: Medicare Other | Admitting: Physical Therapy

## 2016-08-17 ENCOUNTER — Ambulatory Visit: Payer: Medicare Other | Admitting: Occupational Therapy

## 2016-08-17 DIAGNOSIS — R278 Other lack of coordination: Secondary | ICD-10-CM

## 2016-08-17 DIAGNOSIS — R2681 Unsteadiness on feet: Secondary | ICD-10-CM | POA: Diagnosis not present

## 2016-08-17 DIAGNOSIS — R2689 Other abnormalities of gait and mobility: Secondary | ICD-10-CM

## 2016-08-17 DIAGNOSIS — I69353 Hemiplegia and hemiparesis following cerebral infarction affecting right non-dominant side: Secondary | ICD-10-CM

## 2016-08-17 DIAGNOSIS — M6281 Muscle weakness (generalized): Secondary | ICD-10-CM

## 2016-08-17 NOTE — Therapy (Signed)
[facility] [address] [place], [place], [address] Phone: [phone]   Fax:  [phone]  Occupational Therapy Treatment  Patient Details  Name: Jack [name] MRN: [id number] Date of Birth: Aug 24, 1933 Referring Provider: Dr Alger Simons  Encounter Date: 08/17/2016      OT End of Session - 08/17/16 1142    Visit Number 6   Number of Visits 9   Date for OT Re-Evaluation 08/23/16   Authorization Type UHC MCR   OT Start Time 1100   OT Stop Time 1145   OT Time Calculation (min) 45 min   Activity Tolerance Patient tolerated treatment well      Past Medical History:  Diagnosis Date  . Atrial fibrillation ([place])    Dr. Tollie Eth  . BPH (benign prostatic hypertrophy) 2012   prior therapy with finasteride, but as of 03/2015 no c/o; Dr. Luanne Bras  . Cellulitis of face 11/08   [facility]  . CVA (cerebral infarction) 2009, 2015  . Echocardiogram abnormal 02/11/2010   LVEF 70%, severe mitral regurgitation, pulm HTN   . ED (erectile dysfunction)    Viagra prn, consult with Urology 2012  . Elevated PSA 2012   BPH; Dr. Luanne Bras, [facility]; no more PSAs needed as of 2012 consult  . Gait abnormality    uses cane, s/p CVA  . Hemiparesis affecting left side as late effect of cerebrovascular accident ([place])   . Hiatal hernia    per 2008 EUS  . Hyperlipidemia   . Hypertension   . Microscopic hematuria    chronic, eval 2012 with Urology, benign at that time  . Obstructive uropathy 2012   Dr. Luanne Bras  . Pancreatic mass 01/2006   inconsequential per CT 2007  . 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 Patent foramen ovale    hx/o, notation of fistula on echocardiogram from 2009  . Prophylactic antibiotic 2016   requires due to valve disease  . Schatzki's ring 2008   per endoscopic ultrasound; Dr. Janetta Hora. [name]  . Stroke ([place])   .  Ventral hernia    surgery consult 01/2010 with Dr. Excell Seltzer, elective repair if desired , but he declined  . Wears hearing aid     Past Surgical History:  Procedure Laterality Date  . CARDIAC CATHETERIZATION  02/2010   LVEF 70%, severe mitral regurgitation, pulm HTN; prior in 01/2006  . INGUINAL HERNIA REPAIR     bilat  . MITRAL VALVE REPAIR  01/2006   mitral repair with Maze procedure, tricuspid annuloplasty, MAZE and LAA oversewn by Dr. Roxy Manns    There were no vitals filed for this visit.      Subjective Assessment - 08/17/16 1105    Subjective  He's c/o soreness in legs (per wife report)    Patient is accompained by: Family member  wife   Pertinent History Hemorrhagic stroke 2 years ago left spastic hemiplegia   Currently in Pain? No/denies                      OT Treatments/Exercises (OP) - 08/17/16 0001      ADLs   Functional Mobility Standing: reaching outside BOS bilateral sides to retrieve and replace clothespins Rt/Lt hands. Pt with close sup, but w/o LOB     Fine Motor Coordination   Other Fine Motor Exercises Pt placing small pegs in pegboard Rt hand, then Lt hand seated at  tabletop. When using Lt hand, pt often required Rt hand to assist manipulating peg for placement. Pt then placed medium sized pegs in pegboard using Lt hand only with min drops. Then retrieving up to 3 at a time for in hand manipulation with Rt hand.      Visual/Perceptual Exercises   Scanning Environmental   Scanning - Environmental Pt located 9/12 items in moderately busy gym (75% accuracy). 2nd trial - pt found 2/3 remaining items                     OT Long Term Goals - 08/17/16 1140      OT LONG TERM GOAL #1   Title Patient will safely complete shower stall transfer with modified independence   Status Achieved  Recommend grab bar and using shower seat for fall prevention     OT LONG TERM GOAL #2   Title Patient will don/doff jacket/ front openiong  shirt in standing without assistance   Status Achieved     OT LONG TERM GOAL #3   Title Patient will cut his own food during meals with modified independence   Status Achieved     OT LONG TERM GOAL #4   Title Patient will demonstrate sufficient activity tolerance to grocery shop with wife using a cart, and locating 30% needed items   Status Achieved  Per wife report. Also located approx. 75% items in gym     OT LONG TERM GOAL #5   Title Patient will return to simple housekeeping with modified independence   Status On-going               Plan - 08/17/16 1141    Clinical Impression Statement Pt met LTG #4. Approximating remaining goal   Rehab Potential Good   OT Frequency 2x / week   OT Duration 4 weeks   OT Treatment/Interventions Self-care/ADL training;Therapeutic exercise;Neuromuscular education;DME and/or AE instruction;Manual Therapy;Therapist, nutritional;Therapeutic activities;Cognitive remediation/compensation;Visual/perceptual remediation/compensation;Patient/family education;Balance training   Plan address last goal and d/c next session, G-code and FOTO   Consulted and Agree with Plan of Care Patient;Family member/caregiver   Family Member Consulted wife      Patient will benefit from skilled therapeutic intervention in order to improve the following deficits and impairments:  Decreased activity tolerance, Decreased balance, Decreased cognition, Decreased coordination, Decreased safety awareness, Impaired UE functional use, Impaired tone, Decreased range of motion, Difficulty walking, Impaired sensation, Impaired flexibility, Decreased strength, Impaired perceived functional ability, Improper body mechanics, Impaired vision/preception, Decreased endurance, Decreased knowledge of precautions, Decreased knowledge of use of DME, Decreased mobility  Visit Diagnosis: Other abnormalities of gait and mobility  Unsteadiness on feet  Other lack of  coordination    Problem List Patient Active Problem List   Diagnosis Date Noted  . Urinary frequency 07/20/2016  . Subcortical infarction ([place]) 07/09/2016  . PFO (patent foramen ovale)   . History of CVA with residual deficit   . Benign essential HTN   . Non compliance with medical treatment   . Hypokalemia   . Acute blood loss anemia   . Acute CVA (cerebrovascular accident) ([place]) 07/05/2016  . 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 TIA (transient ischemic attack) 07/03/2016  . Right sided weakness 07/03/2016  . Microscopic hematuria 04/03/2015  . Hyperlipidemia 04/03/2015  . Wears hearing aid 04/03/2015  . Changing skin lesion 04/03/2015  . Paresthesia and pain of left extremity 04/02/2015  . Encounter for health maintenance examination in adult  04/02/2015  . Vaccine counseling 04/02/2015  . Hernia of abdominal cavity 04/02/2015  . Impaired gait and mobility 04/02/2015  . Leg swelling 03/03/2015  . Essential hypertension 03/03/2015  . Spastic hemiplegia affecting nondominant side ([place]) 12/14/2013  . ICH (intracerebral hemorrhage) ([place]) 08/02/2013  . Hx of mitral valve repair   . History of stroke   . History of atrial fibrillation   . Cyst and pseudocyst of pancreas 09/13/2007  . PVC (premature ventricular contraction)   . History of renal cyst   . BPH (benign prostatic hypertrophy)     Carey Bullocks, OTR/L 08/17/2016, 11:45 AM  [facility] [address] [place], [place], [address] Phone: [phone]   Fax:  [phone]  Name: JUDDSON COBERN MRN: [id number] Date of Birth: 1933/11/29

## 2016-08-17 NOTE — Therapy (Signed)
[facility] [address] [place], [place], [address] Phone: [phone]   Fax:  [phone]  Physical Therapy Treatment  Patient Details  Name: Jack [name] MRN: [id number] Date of Birth: 1934-02-10 Referring Provider: Dr. Alger Simons  Encounter Date: 08/17/2016      PT End of Session - 08/17/16 2312    Visit Number 6   Number of Visits 17   Date for PT Re-Evaluation 09/17/16   Authorization Type UHC Medicare-GCODE required every 10th visit   PT Start Time 1145   PT Stop Time 1233   PT Time Calculation (min) 48 min   Equipment Utilized During Treatment Gait belt   Activity Tolerance Patient tolerated treatment well   Behavior During Therapy [facility] for tasks assessed/performed      Past Medical History:  Diagnosis Date  . Atrial fibrillation ([place])    Dr. Tollie Eth  . BPH (benign prostatic hypertrophy) 2012   prior therapy with finasteride, but as of 03/2015 no c/o; Dr. Luanne Bras  . Cellulitis of face 11/08   [facility] ENT  . CVA (cerebral infarction) 2009, 2015  . Echocardiogram abnormal 02/11/2010   LVEF 70%, severe mitral regurgitation, pulm HTN   . ED (erectile dysfunction)    Viagra prn, consult with Urology 2012  . Elevated PSA 2012   BPH; Dr. Luanne Bras, [facility]; no more PSAs needed as of 2012 consult  . Gait abnormality    uses cane, s/p CVA  . Hemiparesis affecting left side as late effect of cerebrovascular accident ([place])   . Hiatal hernia    per 2008 EUS  . Hyperlipidemia   . Hypertension   . Microscopic hematuria    chronic, eval 2012 with Urology, benign at that time  . Obstructive uropathy 2012   Dr. Luanne Bras  . Pancreatic mass 01/2006   inconsequential per CT 2007  . 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 Patent foramen ovale    hx/o, notation of fistula on echocardiogram from 2009  . Prophylactic antibiotic 2016    requires due to valve disease  . Schatzki's ring 2008   per endoscopic ultrasound; Dr. Janetta Hora. [name]  . Stroke ([place])   . Ventral hernia    surgery consult 01/2010 with Dr. Excell Seltzer, elective repair if desired , but he declined  . Wears hearing aid     Past Surgical History:  Procedure Laterality Date  . CARDIAC CATHETERIZATION  02/2010   LVEF 70%, severe mitral regurgitation, pulm HTN; prior in 01/2006  . INGUINAL HERNIA REPAIR     bilat  . MITRAL VALVE REPAIR  01/2006   mitral repair with Maze procedure, tricuspid annuloplasty, MAZE and LAA oversewn by Dr. Roxy Manns    There were no vitals filed for this visit.      Subjective Assessment - 08/17/16 2257    Subjective No falls or pain to report. Wife present and reports pt continues to refuse to wear AFO. She was unclear re: items trialed at last PT session (foot up with simulated leather toe cap to decrease Lt foot dragging/tripping) and they made no decisions re: moving forward with either. Patient has not tried going to gym yet due to "it's too cold out."   Patient is accompained by: Family member   Pertinent History old Rt CVA with Lt spastic hemiparesis; new Lt CVA with Rt hemiparesis   Patient Stated Goals walking and balance to improve  Currently in Pain? No/denies                         [facility] Adult PT Treatment/Exercise - 08/17/16 0001      Transfers   Transfers Sit to Stand;Stand to Sit   Sit to Stand 6: Modified independent (Device/Increase time);With upper extremity assist;From bed;From chair/3-in-1   Stand to Sit 6: Modified independent (Device/Increase time);Without upper extremity assist;To chair/3-in-1;To bed;Uncontrolled descent   Number of Reps Other reps (comment)  4     Ambulation/Gait   Ambulation/Gait Assistance 4: Min guard   Ambulation/Gait Assistance Details with foot-up only, pt with Lt foot drag/catch and loss of balance anteriorly with near need for assist to  recover; with foot-up and simulated leather toe cap OR toe cap only, no LOB   Ambulation Distance (Feet) 130 Feet  x 4   Assistive device Straight cane   Gait Pattern Step-through pattern;Decreased step length - right;Decreased dorsiflexion - left;Decreased step length - left;Left foot flat;Wide base of support;Poor foot clearance - left;Decreased arm swing - left;Decreased stance time - left;Left steppage;Left flexed knee in stance;Lateral hip instability;Lateral trunk lean to left;Decreased trunk rotation;Decreased hip/knee flexion - left   Ambulation Surface Level;Indoor     Knee/Hip Exercises: Standing   Lateral Step Up Both;1 set;10 reps;Hand Hold: 2;Step Height: 4"   Lateral Step Up Limitations vc to not pull on // bars to assist himself up and to rely more on legs   Forward Step Up Both;1 set;10 reps;Hand Hold: 1;Step Height: 4"   Forward Step Up Limitations vc and facilitation for upright posture as fully standing on step in single leg stance (trailing leg extended behind) and step down backwards             Balance Exercises - 08/17/16 2308      Balance Exercises: Standing   Standing Eyes Opened Head turns;Foam/compliant surface;Other reps (comment)  on black balance beam perpend; 10 reps horiz & vert           PT Education - 08/17/16 2311    Education provided Yes   Education Details Patient and wife re: foot-up and leather toe cap (done by orthotist). Wife has information for ordering foot-up if pt agrees to use it.    Person(s) Educated Patient;Spouse   Methods Explanation;Demonstration   Comprehension Verbalized understanding;Returned demonstration;Need further instruction          PT Short Term Goals - 08/17/16 2320      PT SHORT TERM GOAL #1   Title Pt will perform HEP with family's supervision for improved strength, balance, and gait.  TARGET 08/18/16   Time 4   Period Weeks   Status On-going     PT SHORT TERM GOAL #2   Title Pt will improve TUG score  to less than or equal to 25 seconds for decreased fall risk.   Time 4   Period Weeks   Status On-going     PT SHORT TERM GOAL #3   Title Pt will improve Berg Balance score to at least 45/56 for decreased fall risk.   Time 4   Period Weeks   Status On-going     PT SHORT TERM GOAL #4   Title Pt will ambulate at least 300 ft using appropriate assistive device (RW versus cane) modified independently, for improved safety with gait.   Time 4   Period Weeks   Status On-going     PT SHORT TERM GOAL #5  Title Pt will negotiate at least 7 steps with one handrail, modified independently for improved safety with stair negotiation in the home.   Time 4   Period Weeks   Status On-going     PT SHORT TERM GOAL #6   Title Pt/family will verbalize understanding of CVA education, including warning signs and symptoms.   Time 4   Period Weeks   Status On-going           PT Long Term Goals - 08/17/16 2321      PT LONG TERM GOAL #1   Title Pt/family will verbalize understanding of fall prevention in the home environment.  TARGET 09/17/16   Time 8   Period Weeks   Status On-going     PT LONG TERM GOAL #2   Title Pt will improve TUG score to less than or equal to 22 seconds for decreased fall risk.   Time 8   Period Weeks   Status On-going     PT LONG TERM GOAL #3   Title Pt will improve gait velocity to at least 1.8 ft/sec for improved gait efficiency and safety.   Time 8   Period Weeks   Status On-going     PT LONG TERM GOAL #4   Title Pt will ambulate at least 50-100 ft, household distances, with cane, with supervision for improved household mobility.   Time 8   Period Weeks   Status On-going     PT LONG TERM GOAL #5   Title Pt will report at least 20% improvement in STroke IS mobiltiy scale on FOTO to demonstrate improved functional mobility.   Time 8   Period Weeks   Status On-going               Plan - 08/17/16 2313    Clinical Impression Statement Skilled  interventions included gait training with various combinations of foot-up brace and simulated leather toe cap on his left shoe to improve Lt foot clearance. Educated patient and wife on benefits of each. Patient has chosen not to wear his Lt AFO and wife wants to discuss if he'll wear the foot-up brace before she orders one. Will continue to consider orthotist visit for shoe modifications. Continue to work on lateral hip strength to improve stability and Lt foot clearance during gait. Patient able to follow instructions to make requested changes in gait pattern, however without frequent cues he reverts to old patterns.    Rehab Potential Good   PT Frequency 2x / week   PT Duration 8 weeks   PT Treatment/Interventions ADLs/Self Care Home Management;Functional mobility training;Gait training;Stair training;Neuromuscular re-education;Patient/family education;Balance training;Therapeutic exercise;Therapeutic activities;Orthotic Fit/Training;DME Instruction   PT Next Visit Plan assess STG's (by 2/7); gait training with & without cane; if pt considering getting leather toe cap and/or foot-up brace, continue to practice with these (blue bootie over Lt toe of shoe);  treadmill training (he used to do at gym after first CVA); nu-step; bil hip strengthening   PT Home Exercise Plan SLS with heel raise; seated hip abdct with red band; sideways walking at counter; standing hip abdct with red theraband    Consulted and Agree with Plan of Care Patient;Family member/caregiver      Patient will benefit from skilled therapeutic intervention in order to improve the following deficits and impairments:  Abnormal gait, Decreased balance, Decreased mobility, Decreased safety awareness, Decreased strength, Difficulty walking, Decreased activity tolerance  Visit Diagnosis: Other abnormalities of gait and mobility  Hemiplegia and hemiparesis  following cerebral infarction affecting right non-dominant side (HCC)  Muscle  weakness (generalized)     Problem List Patient Active Problem List   Diagnosis Date Noted  . Urinary frequency 07/20/2016  . Subcortical infarction ([place]) 07/09/2016  . PFO (patent foramen ovale)   . History of CVA with residual deficit   . Benign essential HTN   . Non compliance with medical treatment   . Hypokalemia   . Acute blood loss anemia   . Acute CVA (cerebrovascular accident) ([place]) 07/05/2016  . 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 TIA (transient ischemic attack) 07/03/2016  . Right sided weakness 07/03/2016  . Microscopic hematuria 04/03/2015  . Hyperlipidemia 04/03/2015  . Wears hearing aid 04/03/2015  . Changing skin lesion 04/03/2015  . Paresthesia and pain of left extremity 04/02/2015  . Encounter for health maintenance examination in adult 04/02/2015  . Vaccine counseling 04/02/2015  . Hernia of abdominal cavity 04/02/2015  . Impaired gait and mobility 04/02/2015  . Leg swelling 03/03/2015  . Essential hypertension 03/03/2015  . Spastic hemiplegia affecting nondominant side ([place]) 12/14/2013  . ICH (intracerebral hemorrhage) ([place]) 08/02/2013  . Hx of mitral valve repair   . History of stroke   . History of atrial fibrillation   . Cyst and pseudocyst of pancreas 09/13/2007  . PVC (premature ventricular contraction)   . History of renal cyst   . BPH (benign prostatic hypertrophy)     Rexanne Mano, PT 08/17/2016, 11:22 PM  [facility] [address] [place], [place], [address] Phone: [phone]   Fax:  [phone]  Name: Jack [name] MRN: [id number] Date of Birth: Apr 02, 1934

## 2016-08-19 ENCOUNTER — Ambulatory Visit: Payer: Medicare Other | Admitting: Occupational Therapy

## 2016-08-19 ENCOUNTER — Ambulatory Visit: Payer: Medicare Other

## 2016-08-19 VITALS — BP 131/66 | HR 92

## 2016-08-19 DIAGNOSIS — R278 Other lack of coordination: Secondary | ICD-10-CM

## 2016-08-19 DIAGNOSIS — R2681 Unsteadiness on feet: Secondary | ICD-10-CM | POA: Diagnosis not present

## 2016-08-19 DIAGNOSIS — M6281 Muscle weakness (generalized): Secondary | ICD-10-CM

## 2016-08-19 DIAGNOSIS — R2689 Other abnormalities of gait and mobility: Secondary | ICD-10-CM

## 2016-08-19 DIAGNOSIS — I69353 Hemiplegia and hemiparesis following cerebral infarction affecting right non-dominant side: Secondary | ICD-10-CM

## 2016-08-19 NOTE — Therapy (Signed)
[facility] [address] [place], [place], [address] Phone: [phone]   Fax:  [phone]  Occupational Therapy Treatment  Patient Details  Name: Jack [name] MRN: [id number] Date of Birth: 1933/12/05 Referring Provider: Dr Alger Simons  Encounter Date: 08/19/2016      OT End of Session - 08/19/16 0959    Visit Number 7   Number of Visits 9   Date for OT Re-Evaluation 08/23/16   Authorization Type UHC MCR   OT Start Time 0930   OT Stop Time 1010   OT Time Calculation (min) 40 min   Activity Tolerance Patient tolerated treatment well      Past Medical History:  Diagnosis Date  . Atrial fibrillation ([place])    Dr. Tollie Eth  . BPH (benign prostatic hypertrophy) 2012   prior therapy with finasteride, but as of 03/2015 no c/o; Dr. Luanne Bras  . Cellulitis of face 11/08   [facility] ENT  . CVA (cerebral infarction) 2009, 2015  . Echocardiogram abnormal 02/11/2010   LVEF 70%, severe mitral regurgitation, pulm HTN   . ED (erectile dysfunction)    Viagra prn, consult with Urology 2012  . Elevated PSA 2012   BPH; Dr. Luanne Bras, [facility]; no more PSAs needed as of 2012 consult  . Gait abnormality    uses cane, s/p CVA  . Hemiparesis affecting left side as late effect of cerebrovascular accident ([place])   . Hiatal hernia    per 2008 EUS  . Hyperlipidemia   . Hypertension   . Microscopic hematuria    chronic, eval 2012 with Urology, benign at that time  . Obstructive uropathy 2012   Dr. Luanne Bras  . Pancreatic mass 01/2006   inconsequential per CT 2007  . 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 Patent foramen ovale    hx/o, notation of fistula on echocardiogram from 2009  . Prophylactic antibiotic 2016   requires due to valve disease  . Schatzki's ring 2008   per endoscopic ultrasound; Dr. Janetta Hora. [name]  . Stroke ([place])   .  Ventral hernia    surgery consult 01/2010 with Dr. Excell Seltzer, elective repair if desired , but he declined  . Wears hearing aid     Past Surgical History:  Procedure Laterality Date  . CARDIAC CATHETERIZATION  02/2010   LVEF 70%, severe mitral regurgitation, pulm HTN; prior in 01/2006  . INGUINAL HERNIA REPAIR     bilat  . MITRAL VALVE REPAIR  01/2006   mitral repair with Maze procedure, tricuspid annuloplasty, MAZE and LAA oversewn by Dr. Roxy Manns    There were no vitals filed for this visit.      Subjective Assessment - 08/19/16 0933    Subjective  The soreness in my legs are somewhat better   Pertinent History Hemorrhagic stroke 2 years ago left spastic hemiplegia   Currently in Pain? No/denies            [facility] OT Assessment - 08/19/16 0001      Coordination   Right 9 Hole Peg Test 32.03   Left 9 Hole Peg Test 55.44 sec (took slightly longer than eval, but only d/t struggling with one peg, pt placed other pegs quickly)                  OT Treatments/Exercises (OP) - 08/19/16 0001      ADLs   ADL Comments Assessed remaining LTG and  re-assessed coordination. Reviewed goals and progress     Exercises   Exercises --  UBE  x 5 min. Level 3 for conditioning/endurance     Fine Motor Coordination   Fine Motor Coordination Flipping cards;In hand manipuation training   In Scientist, clinical (histocompatibility and immunogenetics) using stress balls and highlighter Rt/Lt hands   Flipping cards Alternating Rt/Lt hand   Other Fine Motor Exercises Placing Grooved pegs in pegboard Rt hand, then removing with tweezers using Lt hand. Pt then placed grooved pegs in pegboard Lt hand with mod difficulty                     OT Long Term Goals - 2016/09/14 0958      OT LONG TERM GOAL #1   Title Patient will safely complete shower stall transfer with modified independence   Status Achieved  Recommend grab bar and using shower seat for fall prevention     OT LONG TERM GOAL #2   Title  Patient will don/doff jacket/ front openiong shirt in standing without assistance   Status Achieved     OT LONG TERM GOAL #3   Title Patient will cut his own food during meals with modified independence   Status Achieved     OT LONG TERM GOAL #4   Title Patient will demonstrate sufficient activity tolerance to grocery shop with wife using a cart, and locating 30% needed items   Status Achieved  Per wife report. Also located approx. 75% items in gym     OT LONG TERM GOAL #5   Title Patient will return to simple housekeeping with modified independence   Status Achieved               Plan - Sep 14, 2016 3532    Clinical Impression Statement Pt has met all LTG's for OT and pt/family agrees with discharge   OT Treatment/Interventions Self-care/ADL training;Therapeutic exercise;Neuromuscular education;DME and/or AE instruction;Manual Therapy;Therapist, nutritional;Therapeutic activities;Cognitive remediation/compensation;Visual/perceptual remediation/compensation;Patient/family education;Balance training   Plan D/C OT   Consulted and Agree with Plan of Care Patient;Family member/caregiver   Family Member Consulted wife      Patient will benefit from skilled therapeutic intervention in order to improve the following deficits and impairments:  Decreased activity tolerance, Decreased balance, Decreased cognition, Decreased coordination, Decreased safety awareness, Impaired UE functional use, Impaired tone, Decreased range of motion, Difficulty walking, Impaired sensation, Impaired flexibility, Decreased strength, Impaired perceived functional ability, Improper body mechanics, Impaired vision/preception, Decreased endurance, Decreased knowledge of precautions, Decreased knowledge of use of DME, Decreased mobility  Visit Diagnosis: Other lack of coordination  Hemiplegia and hemiparesis following cerebral infarction affecting right non-dominant side (HCC)  Muscle weakness  (generalized)      G-Codes - September 14, 2016 1006    Functional Assessment Tool Used clinical judgement, 9 hole peg   Functional Limitation Carrying, moving and handling objects   Carrying, Moving and Handling Objects Goal Status ([id number]) At least 20 percent but less than 40 percent impaired, limited or restricted   Carrying, Moving and Handling Objects Discharge Status [phone]) At least 20 percent but less than 40 percent impaired, limited or restricted      Problem List Patient Active Problem List   Diagnosis Date Noted  . Urinary frequency 07/20/2016  . Subcortical infarction ([place]) 07/09/2016  . PFO (patent foramen ovale)   . History of CVA with residual deficit   . Benign essential HTN   . Non compliance with medical treatment   . Hypokalemia   .  Acute blood loss anemia   . Acute CVA (cerebrovascular accident) ([place]) 07/05/2016  . Embolic stroke involving left middle cerebral artery ([place])   . TIA (transient ischemic attack) 07/03/2016  . Right sided weakness 07/03/2016  . Microscopic hematuria 04/03/2015  . Hyperlipidemia 04/03/2015  . Wears hearing aid 04/03/2015  . Changing skin lesion 04/03/2015  . Paresthesia and pain of left extremity 04/02/2015  . Encounter for health maintenance examination in adult 04/02/2015  . Vaccine counseling 04/02/2015  . Hernia of abdominal cavity 04/02/2015  . Impaired gait and mobility 04/02/2015  . Leg swelling 03/03/2015  . Essential hypertension 03/03/2015  . Spastic hemiplegia affecting nondominant side ([place]) 12/14/2013  . ICH (intracerebral hemorrhage) ([place]) 08/02/2013  . Hx of mitral valve repair   . History of stroke   . History of atrial fibrillation   . Cyst and pseudocyst of pancreas 09/13/2007  . PVC (premature ventricular contraction)   . History of renal cyst   . BPH (benign prostatic hypertrophy)    OCCUPATIONAL THERAPY DISCHARGE SUMMARY  Visits from Start of Care: 7  Current functional level related to goals / functional  outcomes: See above - pt met all LTG's   Remaining deficits: Engineer, maintenance   Education / Equipment: HEP's, safety techniques/fall prevention, A/E and/or DME recommendations for safety and greater ease  Plan: Patient agrees to discharge.  Patient goals were met. Patient is being discharged due to meeting the stated rehab goals.  ?????        Phu, Record, OTR/L 08/19/2016, 10:06 AM  [facility] [address] [place], [place], [address] Phone: [phone]   Fax:  [phone]  Name: Jack [name] MRN: [id number] Date of Birth: 1934-04-09

## 2016-08-19 NOTE — Therapy (Signed)
[facility] [address] [place], [place], [address] Phone: [phone]   Fax:  [phone]  Physical Therapy Treatment  Patient Details  Name: Jack [name] MRN: [id number] Date of Birth: [birth year] Referring Provider: Dr. Alger Simons  Encounter Date: 08/19/2016      PT End of Session - 08/19/16 1149    Visit Number 7   Number of Visits 17   Date for PT Re-Evaluation 09/17/16   Authorization Type UHC Medicare-GCODE required every 10th visit   PT Start Time 1016   PT Stop Time 1058   PT Time Calculation (min) 42 min   Equipment Utilized During Treatment Gait belt   Activity Tolerance Patient tolerated treatment well   Behavior During Therapy [facility] for tasks assessed/performed      Past Medical History:  Diagnosis Date  . Atrial fibrillation ([place])    Dr. Tollie Eth  . BPH (benign prostatic hypertrophy) 2012   prior therapy with finasteride, but as of 03/2015 no c/o; Dr. Luanne Bras  . Cellulitis of face 11/08   [facility]NT  . CVA (cerebral infarction) 2009, 2015  . Echocardiogram abnormal 02/11/2010   LVEF 70%, severe mitral regurgitation, pulm HTN   . ED (erectile dysfunction)    Viagra prn, consult with Urology 2012  . Elevated PSA 2012   BPH; Dr. Luanne Bras, [facility]; no more PSAs needed as of 2012 consult  . Gait abnormality    uses cane, s/p CVA  . Hemiparesis affecting left side as late effect of cerebrovascular accident ([place])   . Hiatal hernia    per 2008 EUS  . Hyperlipidemia   . Hypertension   . Microscopic hematuria    chronic, eval 2012 with Urology, benign at that time  . Obstructive uropathy 2012   Dr. Luanne Bras  . Pancreatic mass 01/2006   inconsequential per CT 2007  . 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 Patent foramen ovale    hx/o, notation of fistula on echocardiogram from 2009  . Prophylactic antibiotic 2016   requires due to valve disease  . Schatzki's ring 2008   per endoscopic ultrasound; Dr. Janetta Hora. [name]  . Stroke ([place])   . Ventral hernia    surgery consult 01/2010 with Dr. Excell Seltzer, elective repair if desired , but he declined  . Wears hearing aid     Past Surgical History:  Procedure Laterality Date  . CARDIAC CATHETERIZATION  02/2010   LVEF 70%, severe mitral regurgitation, pulm HTN; prior in 01/2006  . INGUINAL HERNIA REPAIR     bilat  . MITRAL VALVE REPAIR  01/2006   mitral repair with Maze procedure, tricuspid annuloplasty, MAZE and LAA oversewn by Dr. Roxy Manns    Vitals:   08/19/16 1047  BP: 131/66  Pulse: 92        Subjective Assessment - 08/19/16 1015    Subjective Pt denied falls or changes since last visit.    Patient is accompained by: Family member  Jack [name]   Pertinent History old Rt CVA with Lt spastic hemiparesis; new Lt CVA with Rt hemiparesis   Patient Stated Goals walking and balance to improve   Currently in Pain? No/denies                         [facility] Adult PT Treatment/Exercise - 08/19/16 1019      Ambulation/Gait   Ambulation/Gait Yes   Ambulation/Gait  Assistance 4: Min guard;5: Supervision   Ambulation/Gait Assistance Details Min guard during 1 LOB but pt self corrected with stepping strategy and use of SPC. Otherwise S with cues to improve L heel strike and stride length.   Ambulation Distance (Feet) 345 Feet   Assistive device Straight cane   Gait Pattern Step-through pattern;Decreased step length - right;Decreased dorsiflexion - left;Decreased step length - left;Left foot flat;Wide base of support;Poor foot clearance - left;Decreased arm swing - left;Decreased stance time - left;Left steppage;Left flexed knee in stance;Lateral hip instability;Lateral trunk lean to left;Decreased trunk rotation;Decreased hip/knee flexion - left   Ambulation Surface Level;Indoor   Stairs Yes   Stairs Assistance 5:  Supervision   Stairs Assistance Details (indicate cue type and reason) For safety, as L toes caught on last top step.   Stair Management Technique One rail Right;Alternating pattern;Forwards;With cane   Number of Stairs 8   Height of Stairs 6     Standardized Balance Assessment   Standardized Balance Assessment Berg Balance Test;Timed Up and Go Test     Berg Balance Test   Sit to Stand Able to stand without using hands and stabilize independently   Standing Unsupported Able to stand safely 2 minutes   Sitting with Back Unsupported but Feet Supported on Floor or Stool Able to sit safely and securely 2 minutes   Stand to Sit Sits safely with minimal use of hands   Transfers Able to transfer safely, minor use of hands   Standing Unsupported with Eyes Closed Able to stand 10 seconds safely   Standing Ubsupported with Feet Together Able to place feet together independently and stand for 1 minute with supervision   From Standing, Reach Forward with Outstretched Arm Can reach forward >12 cm safely (5")  8" with L hand   From Standing Position, Pick up Object from Floor Able to pick up shoe, needs supervision   From Standing Position, Turn to Look Behind Over each Shoulder Looks behind from both sides and weight shifts well   Turn 360 Degrees Able to turn 360 degrees safely but slowly   Standing Unsupported, Alternately Place Feet on Step/Stool Able to complete >2 steps/needs minimal assist   Standing Unsupported, One Foot in Front Able to plae foot ahead of the other independently and hold 30 seconds   Standing on One Leg Tries to lift leg/unable to hold 3 seconds but remains standing independently   Total Score 44     Timed Up and Go Test   TUG Normal TUG   Normal TUG (seconds) 21.9  with SPC and 22.1sec. without AD           Self Care:     PT Education - 08/19/16 1148    Education provided Yes   Education Details PT discussed goal progress and outcome measures. PT reiterated  the importance of donning AFO during amb, especially while amb. longer distances to improve L toe clearance. Pt reported he does not want the L foot-up or leather toe cap-despite PT's recommendation. PT discussed adding 2x/week for 4 weeks. Pt agreeable.    Person(s) Educated Patient;Spouse   Methods Explanation   Comprehension Verbalized understanding          PT Short Term Goals - 08/19/16 1152      PT SHORT TERM GOAL #1   Title Pt will perform HEP with family's supervision for improved strength, balance, and gait.  TARGET 08/18/16   Time 4   Period Weeks   Status Achieved  PT SHORT TERM GOAL #2   Title Pt will improve TUG score to less than or equal to 25 seconds for decreased fall risk.   Time 4   Period Weeks   Status Partially Met     PT SHORT TERM GOAL #3   Title Pt will improve Berg Balance score to at least 45/56 for decreased fall risk.   Time 4   Period Weeks   Status Partially Met     PT SHORT TERM GOAL #4   Title Pt will ambulate at least 300 ft using appropriate assistive device (RW versus cane) modified independently, for improved safety with gait.   Time 4   Period Weeks   Status Partially Met     PT SHORT TERM GOAL #5   Title Pt will negotiate at least 7 steps with one handrail, modified independently for improved safety with stair negotiation in the home.   Time 4   Period Weeks   Status Partially Met     PT SHORT TERM GOAL #6   Title Pt/family will verbalize understanding of CVA education, including warning signs and symptoms.   Time 4   Period Weeks   Status Achieved           PT Long Term Goals - 08/17/16 2321      PT LONG TERM GOAL #1   Title Pt/family will verbalize understanding of fall prevention in the home environment.  TARGET 09/17/16   Time 8   Period Weeks   Status On-going     PT LONG TERM GOAL #2   Title Pt will improve TUG score to less than or equal to 22 seconds for decreased fall risk.   Time 8   Period Weeks   Status  On-going     PT LONG TERM GOAL #3   Title Pt will improve gait velocity to at least 1.8 ft/sec for improved gait efficiency and safety.   Time 8   Period Weeks   Status On-going     PT LONG TERM GOAL #4   Title Pt will ambulate at least 50-100 ft, household distances, with cane, with supervision for improved household mobility.   Time 8   Period Weeks   Status On-going     PT LONG TERM GOAL #5   Title Pt will report at least 20% improvement in STroke IS mobiltiy scale on FOTO to demonstrate improved functional mobility.   Time 8   Period Weeks   Status On-going               Plan - 08/19/16 1149    Clinical Impression Statement Pt met STGs 2 and 6. Pt partially met STGs 3, 4, and 5. PT will assess remaining STG 1 (HEP) next session. Pt continues to require assist-S during gait 2/2 LOB due to poor L toe clearance. Pt would continue to benefit from skilled PT to improve safety during functional mobility.    Rehab Potential Good   PT Frequency 2x / week   PT Duration 8 weeks   PT Treatment/Interventions ADLs/Self Care Home Management;Functional mobility training;Gait training;Stair training;Neuromuscular re-education;Patient/family education;Balance training;Therapeutic exercise;Therapeutic activities;Orthotic Fit/Training;DME Instruction   PT Next Visit Plan Assess STG 1 (HEP). Continue gait training with & without cane; if pt considering getting leather toe cap and/or foot-up brace, continue to practice with these (blue bootie over Lt toe of shoe);  treadmill training (he used to do at gym after first CVA); nu-step; bil hip strengthening   PT Home  Exercise Plan SLS with heel raise; seated hip abdct with red band; sideways walking at counter; standing hip abdct with red theraband    Consulted and Agree with Plan of Care Patient;Family member/caregiver      Patient will benefit from skilled therapeutic intervention in order to improve the following deficits and impairments:   Abnormal gait, Decreased balance, Decreased mobility, Decreased safety awareness, Decreased strength, Difficulty walking, Decreased activity tolerance  Visit Diagnosis: Other abnormalities of gait and mobility  Unsteadiness on feet  Other lack of coordination  Hemiplegia and hemiparesis following cerebral infarction affecting right non-dominant side [facility])     Problem List Patient Active Problem List   Diagnosis Date Noted  . Urinary frequency 07/20/2016  . Subcortical infarction ([place]) 07/09/2016  . PFO (patent foramen ovale)   . History of CVA with residual deficit   . Benign essential HTN   . Non compliance with medical treatment   . Hypokalemia   . Acute blood loss anemia   . Acute CVA (cerebrovascular accident) ([place]) 07/05/2016  . 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 TIA (transient ischemic attack) 07/03/2016  . Right sided weakness 07/03/2016  . Microscopic hematuria 04/03/2015  . Hyperlipidemia 04/03/2015  . Wears hearing aid 04/03/2015  . Changing skin lesion 04/03/2015  . Paresthesia and pain of left extremity 04/02/2015  . Encounter for health maintenance examination in adult 04/02/2015  . Vaccine counseling 04/02/2015  . Hernia of abdominal cavity 04/02/2015  . Impaired gait and mobility 04/02/2015  . Leg swelling 03/03/2015  . Essential hypertension 03/03/2015  . Spastic hemiplegia affecting nondominant side ([place]) 12/14/2013  . ICH (intracerebral hemorrhage) ([place]) 08/02/2013  . Hx of mitral valve repair   . History of stroke   . History of atrial fibrillation   . Cyst and pseudocyst of pancreas 09/13/2007  . PVC (premature ventricular contraction)   . History of renal cyst   . BPH (benign prostatic hypertrophy)     Miller,Jennifer L 08/19/2016, 11:53 AM  [facility] [address] [place], [place], [address] Phone: [phone]   Fax:  [phone]  Name: Jack [name] MRN: [id number] Date of Birth: [birth year]  Geoffry Paradise, PT,DPT 08/19/16 11:54 AM Phone: [phone] Fax: [phone]

## 2016-08-24 ENCOUNTER — Ambulatory Visit: Payer: Medicare Other | Admitting: Physical Therapy

## 2016-08-24 DIAGNOSIS — R2681 Unsteadiness on feet: Secondary | ICD-10-CM | POA: Diagnosis not present

## 2016-08-24 DIAGNOSIS — M6281 Muscle weakness (generalized): Secondary | ICD-10-CM

## 2016-08-24 DIAGNOSIS — R2689 Other abnormalities of gait and mobility: Secondary | ICD-10-CM

## 2016-08-24 NOTE — Therapy (Signed)
[facility] [address] [place], [place], [address] Phone: [phone]   Fax:  [phone]  Physical Therapy Treatment  Patient Details  Name: Jack [name] MRN: [id number] Date of Birth: 1934-05-17 Referring Provider: Dr. Alger Simons  Encounter Date: 08/24/2016      PT End of Session - 08/24/16 1729    Visit Number 8   Number of Visits 17   Date for PT Re-Evaluation 09/17/16   Authorization Type UHC Medicare-GCODE required every 10th visit   PT Start Time 0847   PT Stop Time 0932   PT Time Calculation (min) 45 min   Equipment Utilized During Treatment Gait belt      Past Medical History:  Diagnosis Date  . Atrial fibrillation ([place])    Dr. Tollie Eth  . BPH (benign prostatic hypertrophy) 2012   prior therapy with finasteride, but as of 03/2015 no c/o; Dr. Luanne Bras  . Cellulitis of face 11/08   [facility]NT  . CVA (cerebral infarction) 2009, 2015  . Echocardiogram abnormal 02/11/2010   LVEF 70%, severe mitral regurgitation, pulm HTN   . ED (erectile dysfunction)    Viagra prn, consult with Urology 2012  . Elevated PSA 2012   BPH; Dr. Luanne Bras, [facility]; no more PSAs needed as of 2012 consult  . Gait abnormality    uses cane, s/p CVA  . Hemiparesis affecting left side as late effect of cerebrovascular accident ([place])   . Hiatal hernia    per 2008 EUS  . Hyperlipidemia   . Hypertension   . Microscopic hematuria    chronic, eval 2012 with Urology, benign at that time  . Obstructive uropathy 2012   Dr. Luanne Bras  . Pancreatic mass 01/2006   inconsequential per CT 2007  . 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 Patent foramen ovale    hx/o, notation of fistula on echocardiogram from 2009  . Prophylactic antibiotic 2016   requires due to valve disease  . Schatzki's ring 2008   per endoscopic ultrasound; Dr. Janetta Hora. Herbie [place]  Buccini  . Stroke ([place])   . Ventral hernia    surgery consult 01/2010 with Dr. Excell Seltzer, elective repair if desired , but he declined  . Wears hearing aid     Past Surgical History:  Procedure Laterality Date  . CARDIAC CATHETERIZATION  02/2010   LVEF 70%, severe mitral regurgitation, pulm HTN; prior in 01/2006  . INGUINAL HERNIA REPAIR     bilat  . MITRAL VALVE REPAIR  01/2006   mitral repair with Maze procedure, tricuspid annuloplasty, MAZE and LAA oversewn by Dr. Roxy Manns    There were no vitals filed for this visit.      Subjective Assessment - 08/24/16 1436    Subjective Pt reports he went to gym yesterday - did treadmill for 10" and rode bike for 30"    Patient is accompained by: Family member   Pertinent History old Rt CVA with Lt spastic hemiparesis; new Lt CVA with Rt hemiparesis   Patient Stated Goals walking and balance to improve   Currently in Pain? No/denies                         [facility] Adult PT Treatment/Exercise - 08/24/16 0913      Transfers   Transfers Sit to Stand   Sit to Stand 5: Supervision   Stand to Sit 5: Supervision  cues for increased weight shift onto LLE with stand to sit   Number of Reps 10 reps   Comments R foot placed on red balance bubble for increased weight shift onto LLE during sit to stand;  tactile cues to weight shift over onto LLE given for first 3 reps     Ambulation/Gait   Ambulation/Gait Yes   Ambulation/Gait Assistance 4: Min guard   Ambulation Distance (Feet) 200 Feet   Assistive device None   Gait Pattern Step-through pattern;Decreased arm swing - left     Knee/Hip Exercises: Standing   Heel Raises Both;1 set;10 reps  LLE only 10 reps   Hip Flexion Stengthening;Left;1 set;10 reps  3# weight   Hip Abduction Stengthening;Both;1 set  3# weight   Hip Extension Stengthening;1 set;10 reps  3# weight     Gait:  Tactile and verbal cues given for increased weight bearing on LLE with decreased posterior  trunk lean - pt amb. Approx. 40' X 5 reps with cues for increased arm swing, increased left hip and knee flexion in swing phase of gait - with no device used  NeuroRe-ed:  Single limb stance activities - stepping over orange hurdle with UE support with RLE and LLE 10 reps each for improved  SLS Touching balance bubble with each foot with CGA for improved SLS x 5 reps each             PT Short Term Goals - 08/24/16 1744      PT SHORT TERM GOAL #1   Title Pt will perform HEP with family's supervision for improved strength, balance, and gait.  TARGET 08/18/16   Baseline met 08-24-16 per pt report   Status Achieved     PT SHORT TERM GOAL #2   Title Pt will improve TUG score to less than or equal to 25 seconds for decreased fall risk.   Status Partially Met     PT SHORT TERM GOAL #3   Title Pt will improve Berg Balance score to at least 45/56 for decreased fall risk.   Status Partially Met     PT SHORT TERM GOAL #4   Title Pt will ambulate at least 300 ft using appropriate assistive device (RW versus cane) modified independently, for improved safety with gait.   Status Partially Met     PT SHORT TERM GOAL #5   Title Pt will negotiate at least 7 steps with one handrail, modified independently for improved safety with stair negotiation in the home.   Status Partially Met     PT SHORT TERM GOAL #6   Title Pt/family will verbalize understanding of CVA education, including warning signs and symptoms.   Status Achieved           PT Long Term Goals - 08/17/16 2321      PT LONG TERM GOAL #1   Title Pt/family will verbalize understanding of fall prevention in the home environment.  TARGET 09/17/16   Time 8   Period Weeks   Status On-going     PT LONG TERM GOAL #2   Title Pt will improve TUG score to less than or equal to 22 seconds for decreased fall risk.   Time 8   Period Weeks   Status On-going     PT LONG TERM GOAL #3   Title Pt will improve gait velocity to at least  1.8 ft/sec for improved gait efficiency and safety.   Time 8   Period Weeks   Status On-going  PT LONG TERM GOAL #4   Title Pt will ambulate at least 50-100 ft, household distances, with cane, with supervision for improved household mobility.   Time 8   Period Weeks   Status On-going     PT LONG TERM GOAL #5   Title Pt will report at least 20% improvement in STroke IS mobiltiy scale on FOTO to demonstrate improved functional mobility.   Time 8   Period Weeks   Status On-going               Plan - 08/24/16 1737    Clinical Impression Statement Pt reports no problems with HEP and reports he went back to gym yesterday and did treadmill and bike.  Gait pattern much improved with verbal and tactile cues to shift weight anteriorly and to flex left knee during swing phase of gait; also cue given to increase left arm swing.  Left toe clearance improved with improvement in gait pattern with other deviations reduced.                                                                                                                                                          Rehab Potential Good   PT Frequency 2x / week   PT Duration 8 weeks   PT Treatment/Interventions ADLs/Self Care Home Management;Functional mobility training;Gait training;Stair training;Neuromuscular re-education;Patient/family education;Balance training;Therapeutic exercise;Therapeutic activities;Orthotic Fit/Training;DME Instruction   PT Next Visit Plan Cont gait training with/without cane and cont balance training - pt states he is not going to get leather toe cap    PT Home Exercise Plan SLS with heel raise; seated hip abdct with red band; sideways walking at counter; standing hip abdct with red theraband    Consulted and Agree with Plan of Care Patient;Family member/caregiver   Family Member Consulted wife      Patient will benefit from skilled therapeutic intervention in order to improve the following deficits and  impairments:  Abnormal gait, Decreased balance, Decreased mobility, Decreased safety awareness, Decreased strength, Difficulty walking, Decreased activity tolerance  Visit Diagnosis: Other abnormalities of gait and mobility  Muscle weakness (generalized)     Problem List Patient Active Problem List   Diagnosis Date Noted  . Urinary frequency 07/20/2016  . Subcortical infarction ([place]) 07/09/2016  . PFO (patent foramen ovale)   . History of CVA with residual deficit   . Benign essential HTN   . Non compliance with medical treatment   . Hypokalemia   . Acute blood loss anemia   . Acute CVA (cerebrovascular accident) ([place]) 07/05/2016  . Embolic stroke involving left middle cerebral artery ([place])   . TIA (transient ischemic attack) 07/03/2016  . Right sided weakness 07/03/2016  . Microscopic hematuria 04/03/2015  . Hyperlipidemia 04/03/2015  . Wears hearing aid 04/03/2015  . Changing skin lesion  04/03/2015  . Paresthesia and pain of left extremity 04/02/2015  . Encounter for health maintenance examination in adult 04/02/2015  . Vaccine counseling 04/02/2015  . Hernia of abdominal cavity 04/02/2015  . Impaired gait and mobility 04/02/2015  . Leg swelling 03/03/2015  . Essential hypertension 03/03/2015  . Spastic hemiplegia affecting nondominant side ([place]) 12/14/2013  . ICH (intracerebral hemorrhage) ([place]) 08/02/2013  . Hx of mitral valve repair   . History of stroke   . History of atrial fibrillation   . Cyst and pseudocyst of pancreas 09/13/2007  . PVC (premature ventricular contraction)   . History of renal cyst   . BPH (benign prostatic hypertrophy)     , [place], PT 08/24/2016, 5:47 PM  [facility] [address] [place], [place], [address] Phone: [phone]   Fax:  [phone]  Name: YALE GOLLA MRN: [id number] Date of Birth: 01/29/1934

## 2016-08-26 ENCOUNTER — Ambulatory Visit: Payer: Medicare Other | Admitting: Physical Therapy

## 2016-08-26 DIAGNOSIS — M6281 Muscle weakness (generalized): Secondary | ICD-10-CM

## 2016-08-26 DIAGNOSIS — R2689 Other abnormalities of gait and mobility: Secondary | ICD-10-CM

## 2016-08-26 DIAGNOSIS — R278 Other lack of coordination: Secondary | ICD-10-CM

## 2016-08-26 DIAGNOSIS — R2681 Unsteadiness on feet: Secondary | ICD-10-CM | POA: Diagnosis not present

## 2016-08-26 NOTE — Patient Instructions (Signed)
Balance, Proprioception: Hip Flexion With Tubing    Hold onto counter or cane with right hand. With band around ankles of legs, raise right leg forward. Return. Repeat __10__ times Do __2_ sets per day.  Can do on other leg if time.    http://cc.exer.us/18   Copyright  VHI. All rights reserved.

## 2016-08-26 NOTE — Therapy (Signed)
[facility] [address] [place], [place], [address] Phone: [phone]   Fax:  [phone]  Physical Therapy Treatment  Patient Details  Name: Jack [name] MRN: [id number] Date of Birth: [birth year] Referring Provider: Dr. Alger Simons  Encounter Date: 08/26/2016      PT End of Session - 08/26/16 2129    Visit Number 9   Number of Visits 17   Date for PT Re-Evaluation 09/17/16   Authorization Type UHC Medicare-GCODE required every 10th visit   PT Start Time 0845   PT Stop Time 0935   PT Time Calculation (min) 50 min   Equipment Utilized During Treatment Gait belt   Activity Tolerance Patient tolerated treatment well   Behavior During Therapy [facility] for tasks assessed/performed      Past Medical History:  Diagnosis Date  . Atrial fibrillation ([place])    Dr. Tollie Eth  . BPH (benign prostatic hypertrophy) 2012   prior therapy with finasteride, but as of 03/2015 no c/o; Dr. Luanne Bras  . Cellulitis of face 11/08   [facility]  . CVA (cerebral infarction) 2009, 2015  . Echocardiogram abnormal 02/11/2010   LVEF 70%, severe mitral regurgitation, pulm HTN   . ED (erectile dysfunction)    Viagra prn, consult with Urology 2012  . Elevated PSA 2012   BPH; Dr. Luanne Bras, [facility]; no more PSAs needed as of 2012 consult  . Gait abnormality    uses cane, s/p CVA  . Hemiparesis affecting left side as late effect of cerebrovascular accident ([place])   . Hiatal hernia    per 2008 EUS  . Hyperlipidemia   . Hypertension   . Microscopic hematuria    chronic, eval 2012 with Urology, benign at that time  . Obstructive uropathy 2012   Dr. Luanne Bras  . Pancreatic mass 01/2006   inconsequential per CT 2007  . 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 Patent foramen ovale    hx/o, notation of fistula on echocardiogram from 2009  . Prophylactic antibiotic 2016    requires due to valve disease  . Schatzki's ring 2008   per endoscopic ultrasound; Dr. Janetta Hora. [name]  . Stroke ([place])   . Ventral hernia    surgery consult 01/2010 with Dr. Excell Seltzer, elective repair if desired , but he declined  . Wears hearing aid     Past Surgical History:  Procedure Laterality Date  . CARDIAC CATHETERIZATION  02/2010   LVEF 70%, severe mitral regurgitation, pulm HTN; prior in 01/2006  . INGUINAL HERNIA REPAIR     bilat  . MITRAL VALVE REPAIR  01/2006   mitral repair with Maze procedure, tricuspid annuloplasty, MAZE and LAA oversewn by Dr. Roxy Manns    There were no vitals filed for this visit.      Subjective Assessment - 08/26/16 0854    Subjective (P)  Pt states he has not been back to the gym. Been too busy.    Patient is accompained by: (P)  Family member   Pertinent History (P)  old Rt CVA with Lt spastic hemiparesis; new Lt CVA with Rt hemiparesis   Patient Stated Goals (P)  walking and balance to improve   Currently in Pain? (P)  No/denies                         [facility] Adult PT Treatment/Exercise - 08/26/16 0001  Transfers   Transfers Sit to Stand;Stand to Sit   Sit to Stand 6: Modified independent (Device/Increase time)   Stand to Sit 6: Modified independent (Device/Increase time)     Ambulation/Gait   Ambulation/Gait Assistance 4: Min guard   Ambulation/Gait Assistance Details No episodes of catching Lt foot during gait. Noted improved knee flexion during swing phase. Tactile cues to reduce posterior lean of upper trunk when advancing    Ambulation Distance (Feet) 110 Feet  x 3   Assistive device Straight cane   Gait Pattern Step-through pattern;Decreased arm swing - left;Decreased step length - right;Decreased step length - left;Decreased hip/knee flexion - left;Decreased dorsiflexion - left;Decreased weight shift to right;Lateral hip instability   Ambulation Surface Level;Indoor     Knee/Hip  Exercises: Aerobic   Tread Mill 5 minutes; speed 1.7 mph, bil UEs holding rail in front     Knee/Hip Exercises: Standing   Hip Flexion Stengthening;Left;10 reps;Knee straight  red band   Hip Abduction Stengthening;Both;1 set;10 reps;Knee straight  red theraband   Hip Extension Stengthening;Both;1 set;10 reps;Knee straight   Gait Training in //bars, RLE standing on 2" step, practiced LLE swing through with target for lt heel to strike x 10;      Balance activities-in // bars with at best 2 fingers supporting him for balance, standing on red/gray balance beam with it placed horizontally. Pt able to complete 30 sec; x 1 each leg with multiple attempts            PT Education - 08/26/16 2126    Education provided Yes   Education Details educated in standing ex's for hip strengthening. Issued red band   Person(s) Educated Patient;Spouse   Methods Explanation;Demonstration;Tactile cues;Handout   Comprehension Verbalized understanding;Returned demonstration          PT Short Term Goals - 08/24/16 1744      PT SHORT TERM GOAL #1   Title Pt will perform HEP with family's supervision for improved strength, balance, and gait.  TARGET 08/18/16   Baseline met 08-24-16 per pt report   Status Achieved     PT SHORT TERM GOAL #2   Title Pt will improve TUG score to less than or equal to 25 seconds for decreased fall risk.   Status Partially Met     PT SHORT TERM GOAL #3   Title Pt will improve Berg Balance score to at least 45/56 for decreased fall risk.   Status Partially Met     PT SHORT TERM GOAL #4   Title Pt will ambulate at least 300 ft using appropriate assistive device (RW versus cane) modified independently, for improved safety with gait.   Status Partially Met     PT SHORT TERM GOAL #5   Title Pt will negotiate at least 7 steps with one handrail, modified independently for improved safety with stair negotiation in the home.   Status Partially Met     PT SHORT TERM GOAL #6    Title Pt/family will verbalize understanding of CVA education, including warning signs and symptoms.   Status Achieved           PT Long Term Goals - 08/17/16 2321      PT LONG TERM GOAL #1   Title Pt/family will verbalize understanding of fall prevention in the home environment.  TARGET 09/17/16   Time 8   Period Weeks   Status On-going     PT LONG TERM GOAL #2   Title Pt will improve TUG score  to less than or equal to 22 seconds for decreased fall risk.   Time 8   Period Weeks   Status On-going     PT LONG TERM GOAL #3   Title Pt will improve gait velocity to at least 1.8 ft/sec for improved gait efficiency and safety.   Time 8   Period Weeks   Status On-going     PT LONG TERM GOAL #4   Title Pt will ambulate at least 50-100 ft, household distances, with cane, with supervision for improved household mobility.   Time 8   Period Weeks   Status On-going     PT LONG TERM GOAL #5   Title Pt will report at least 20% improvement in STroke IS mobiltiy scale on FOTO to demonstrate improved functional mobility.   Time 8   Period Weeks   Status On-going               Plan - 08/26/16 2130    Clinical Impression Statement Denies falls or problems with HEP. Skilled session included gait training and education on additions to his HEP. On arrival, pt noted to have increased knee flexion during swing phase of gair.    Rehab Potential Good   PT Frequency 2x / week   PT Duration 8 weeks   PT Treatment/Interventions ADLs/Self Care Home Management;Functional mobility training;Gait training;Stair training;Neuromuscular re-education;Patient/family education;Balance training;Therapeutic exercise;Therapeutic activities;Orthotic Fit/Training;DME Instruction   PT Next Visit Plan Cont gait training with/without cane and cont balance training - pt states he is not going to get leather toe cap    PT Home Exercise Plan SLS with heel raise; seated hip abdct with red band; sideways walking  at counter; standing hip abdct with red theraband    Consulted and Agree with Plan of Care Patient;Family member/caregiver   Family Member Consulted wife      Patient will benefit from skilled therapeutic intervention in order to improve the following deficits and impairments:  Abnormal gait, Decreased balance, Decreased mobility, Decreased safety awareness, Decreased strength, Difficulty walking, Decreased activity tolerance  Visit Diagnosis: Other abnormalities of gait and mobility  Muscle weakness (generalized)  Other lack of coordination     Problem List Patient Active Problem List   Diagnosis Date Noted  . Urinary frequency 07/20/2016  . Subcortical infarction ([place]) 07/09/2016  . PFO (patent foramen ovale)   . History of CVA with residual deficit   . Benign essential HTN   . Non compliance with medical treatment   . Hypokalemia   . Acute blood loss anemia   . Acute CVA (cerebrovascular accident) ([place]) 07/05/2016  . 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 TIA (transient ischemic attack) 07/03/2016  . Right sided weakness 07/03/2016  . Microscopic hematuria 04/03/2015  . Hyperlipidemia 04/03/2015  . Wears hearing aid 04/03/2015  . Changing skin lesion 04/03/2015  . Paresthesia and pain of left extremity 04/02/2015  . Encounter for health maintenance examination in adult 04/02/2015  . Vaccine counseling 04/02/2015  . Hernia of abdominal cavity 04/02/2015  . Impaired gait and mobility 04/02/2015  . Leg swelling 03/03/2015  . Essential hypertension 03/03/2015  . Spastic hemiplegia affecting nondominant side ([place]) 12/14/2013  . ICH (intracerebral hemorrhage) ([place]) 08/02/2013  . Hx of mitral valve repair   . History of stroke   . History of atrial fibrillation   . Cyst and pseudocyst of pancreas 09/13/2007  . PVC (premature ventricular contraction)   . History of renal cyst   .  BPH (benign prostatic hypertrophy)     Rexanne Mano, PT   08/26/2016, 9:38 PM  [facility] [address] [place], [place], [address] Phone: [phone]   Fax:  [phone]  Name: Jack [name] MRN: [id number] Date of Birth: 1934-06-20

## 2016-08-31 ENCOUNTER — Encounter: Payer: Self-pay | Admitting: Physical Therapy

## 2016-08-31 ENCOUNTER — Ambulatory Visit: Payer: Medicare Other | Admitting: Physical Therapy

## 2016-08-31 DIAGNOSIS — R2681 Unsteadiness on feet: Secondary | ICD-10-CM

## 2016-08-31 DIAGNOSIS — M6281 Muscle weakness (generalized): Secondary | ICD-10-CM

## 2016-08-31 DIAGNOSIS — R2689 Other abnormalities of gait and mobility: Secondary | ICD-10-CM

## 2016-08-31 NOTE — Therapy (Addendum)
[facility] [address] [place], [place], [address] Phone: [phone]   Fax:  [phone]  Physical Therapy Treatment  Patient Details  Name: BAY WAYSON MRN: [id number] Date of Birth: [birth year] Referring Provider: Dr. Alger Simons  Encounter Date: 08/31/2016      PT End of Session - 08/31/16 0851    Visit Number 10   Number of Visits 17   Date for PT Re-Evaluation 09/17/16   Authorization Type UHC Medicare-GCODE required every 10th visit   PT Start Time 0848   PT Stop Time 0930   PT Time Calculation (min) 42 min   Equipment Utilized During Treatment Gait belt   Activity Tolerance Patient tolerated treatment well   Behavior During Therapy [facility] for tasks assessed/performed      Past Medical History:  Diagnosis Date  . Atrial fibrillation ([place])    Dr. Tollie Eth  . BPH (benign prostatic hypertrophy) 2012   prior therapy with finasteride, but as of 03/2015 no c/o; Dr. Luanne Bras  . Cellulitis of face 11/08   [facility]  . CVA (cerebral infarction) 2009, 2015  . Echocardiogram abnormal 02/11/2010   LVEF 70%, severe mitral regurgitation, pulm HTN   . ED (erectile dysfunction)    Viagra prn, consult with Urology 2012  . Elevated PSA 2012   BPH; Dr. Luanne Bras, [facility]; no more PSAs needed as of 2012 consult  . Gait abnormality    uses cane, s/p CVA  . Hemiparesis affecting left side as late effect of cerebrovascular accident ([place])   . Hiatal hernia    per 2008 EUS  . Hyperlipidemia   . Hypertension   . Microscopic hematuria    chronic, eval 2012 with Urology, benign at that time  . Obstructive uropathy 2012   Dr. Luanne Bras  . Pancreatic mass 01/2006   inconsequential per CT 2007  . 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 Patent foramen ovale    hx/o, notation of fistula on echocardiogram from 2009  . Prophylactic antibiotic 2016    requires due to valve disease  . Schatzki's ring 2008   per endoscopic ultrasound; Dr. Janetta Hora. [name]  . Stroke ([place])   . Ventral hernia    surgery consult 01/2010 with Dr. Excell Seltzer, elective repair if desired , but he declined  . Wears hearing aid     Past Surgical History:  Procedure Laterality Date  . CARDIAC CATHETERIZATION  02/2010   LVEF 70%, severe mitral regurgitation, pulm HTN; prior in 01/2006  . INGUINAL HERNIA REPAIR     bilat  . MITRAL VALVE REPAIR  01/2006   mitral repair with Maze procedure, tricuspid annuloplasty, MAZE and LAA oversewn by Dr. Roxy Manns    There were no vitals filed for this visit.      Subjective Assessment - 08/31/16 0851    Subjective No new complaints. Gym is still going well.    Patient is accompained by: Family member   Pertinent History old Rt CVA with Lt spastic hemiparesis; new Lt CVA with Rt hemiparesis   Patient Stated Goals walking and balance to improve   Currently in Pain? No/denies   Pain Score 0-No pain            [facility] Adult PT Treatment/Exercise - 08/31/16 0962      Ambulation/Gait   Ambulation/Gait Yes   Ambulation/Gait Assistance 4: Min guard;5: Supervision   Ambulation/Gait Assistance Details occasional cue for  increased left knee flexion/DF for foot clearance with gait. had pt scanning enviroment and negotiating obstacles/furniture on second and third laps with increased episodes of left toe scuffing noted, veering/mild unsteadiness needing up to min assist at times to correct balance.                     Ambulation Distance (Feet) 220 Feet  x3 reps   Assistive device Straight cane   Gait Pattern Step-through pattern;Decreased arm swing - left;Decreased step length - right;Decreased step length - left;Decreased hip/knee flexion - left;Decreased dorsiflexion - left;Decreased weight shift to right;Lateral hip instability   Ambulation Surface Level;Indoor   Gait velocity 18.0 sec's= 1.82 ft/sec with  caen     Berg Balance Test   Sit to Stand Able to stand without using hands and stabilize independently   Standing Unsupported Able to stand safely 2 minutes   Sitting with Back Unsupported but Feet Supported on Floor or Stool Able to sit safely and securely 2 minutes   Stand to Sit Sits safely with minimal use of hands   Transfers Able to transfer safely, minor use of hands   Standing Unsupported with Eyes Closed Able to stand 10 seconds safely   Standing Ubsupported with Feet Together Able to place feet together independently and stand 1 minute safely   From Standing, Reach Forward with Outstretched Arm Can reach forward >12 cm safely (5")  9 inches   From Standing Position, Pick up Object from Floor Able to pick up shoe safely and easily   From Standing Position, Turn to Look Behind Over each Shoulder Looks behind from both sides and weight shifts well   Turn 360 Degrees Able to turn 360 degrees safely but slowly  > 8 sec's both ways   Standing Unsupported, Alternately Place Feet on Step/Stool Able to complete 4 steps without aid or supervision   Standing Unsupported, One Foot in Front Able to plae foot ahead of the other independently and hold 30 seconds   Standing on One Leg Able to lift leg independently and hold 5-10 seconds  right stance= 6 sec's   Total Score 49     Timed Up and Go Test   TUG Normal TUG   Normal TUG (seconds) 16.56  with cane            PT Short Term Goals - 08/24/16 1744      PT SHORT TERM GOAL #1   Title Pt will perform HEP with family's supervision for improved strength, balance, and gait.  TARGET 08/18/16   Baseline met 08-24-16 per pt report   Status Achieved     PT SHORT TERM GOAL #2   Title Pt will improve TUG score to less than or equal to 25 seconds for decreased fall risk.   Status Partially Met     PT SHORT TERM GOAL #3   Title Pt will improve Berg Balance score to at least 45/56 for decreased fall risk.   Status Partially Met     PT  SHORT TERM GOAL #4   Title Pt will ambulate at least 300 ft using appropriate assistive device (RW versus cane) modified independently, for improved safety with gait.   Status Partially Met     PT SHORT TERM GOAL #5   Title Pt will negotiate at least 7 steps with one handrail, modified independently for improved safety with stair negotiation in the home.   Status Partially Met     PT SHORT TERM  GOAL #6   Title Pt/family will verbalize understanding of CVA education, including warning signs and symptoms.   Status Achieved           PT Long Term Goals - 2016-09-12 1225      PT LONG TERM GOAL #1   Title Pt/family will verbalize understanding of fall prevention in the home environment.  TARGET 09/17/16   Time 8   Period Weeks   Status On-going     PT LONG TERM GOAL #2   Title Pt will improve TUG score to less than or equal to 22 seconds for decreased fall risk.   Baseline 12-Sep-2016: met with 16.56 with cane today   Status Achieved     PT LONG TERM GOAL #3   Title Pt will improve gait velocity to at least 1.8 ft/sec for improved gait efficiency and safety.   Baseline 09-12-16: met today with 1.82 ft/sec with cane   Status Achieved     PT LONG TERM GOAL #4   Title Pt will ambulate at least 50-100 ft, household distances, with cane, with supervision for improved household mobility.   Time 8   Period Weeks   Status On-going     PT LONG TERM GOAL #5   Title Pt will report at least 20% improvement in STroke IS mobiltiy scale on FOTO to demonstrate improved functional mobility.   Time 8   Period Weeks   Status On-going            Plan - September 12, 2016 5625    Clinical Impression Statement Today's skilled session initially focused on rechecking functional measurements to establish progress made for G-code with 2 LTGs met. Remainder of session addressed gait with cane including negotiation obstacles/furniture and on scanning enviroment. Pt needs increased assistance with these activities  due to imbalance/increased episodes of toe scuffing. Pt is making steady progress toward goals and should benefit from continued PT to progress toward unmet goals.                    Rehab Potential Good   PT Frequency 2x / week   PT Duration 8 weeks   PT Treatment/Interventions ADLs/Self Care Home Management;Functional mobility training;Gait training;Stair training;Neuromuscular re-education;Patient/family education;Balance training;Therapeutic exercise;Therapeutic activities;Orthotic Fit/Training;DME Instruction   PT Next Visit Plan Cont gait training with/without cane (Lt foot clearance, lt knee flexion in swing, decr posterior lean) and cont balance training (FYI, pt has refused all braces, toe cap, etc for left leg)    PT Home Exercise Plan SLS with heel raise; seated hip abdct with red band; sideways walking at counter; standing hip abdct with red theraband    Consulted and Agree with Plan of Care Patient;Family member/caregiver   Family Member Consulted wife      Patient will benefit from skilled therapeutic intervention in order to improve the following deficits and impairments:  Abnormal gait, Decreased balance, Decreased mobility, Decreased safety awareness, Decreased strength, Difficulty walking, Decreased activity tolerance  Visit Diagnosis: Other abnormalities of gait and mobility  Muscle weakness (generalized)  Unsteadiness on feet       G-Codes - 12-Sep-2016 1223    Functional Assessment Tool Used Berg;Other (comment)   Functional Assessment Tool Used Berg 49/56, TUG 16.56 with cane, gait velocity 1.82 ft/sec with cane   Functional Limitation Mobility: Walking and moving around      Problem List Patient Active Problem List   Diagnosis Date Noted  . Urinary frequency 07/20/2016  . Subcortical infarction ([place]) 07/09/2016  .  PFO (patent foramen ovale)   . History of CVA with residual deficit   . Benign essential HTN   . Non compliance with medical treatment   .  Hypokalemia   . Acute blood loss anemia   . Acute CVA (cerebrovascular accident) ([place]) 07/05/2016  . 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 TIA (transient ischemic attack) 07/03/2016  . Right sided weakness 07/03/2016  . Microscopic hematuria 04/03/2015  . Hyperlipidemia 04/03/2015  . Wears hearing aid 04/03/2015  . Changing skin lesion 04/03/2015  . Paresthesia and pain of left extremity 04/02/2015  . Encounter for health maintenance examination in adult 04/02/2015  . Vaccine counseling 04/02/2015  . Hernia of abdominal cavity 04/02/2015  . Impaired gait and mobility 04/02/2015  . Leg swelling 03/03/2015  . Essential hypertension 03/03/2015  . Spastic hemiplegia affecting nondominant side ([place]) 12/14/2013  . ICH (intracerebral hemorrhage) ([place]) 08/02/2013  . Hx of mitral valve repair   . History of stroke   . History of atrial fibrillation   . Cyst and pseudocyst of pancreas 09/13/2007  . PVC (premature ventricular contraction)   . History of renal cyst   . BPH (benign prostatic hypertrophy)     Physical Therapy Progress Note  Dates of Reporting Period: 07-19-16 to 08-31-16  Objective Reports of Subjective Statement: see above for progress towards goals  Objective Measurements: TUG score 16.56 secs; gait velocity 1.82 ft/sec with cane:  Berg score 49/56   Goal Update: see above  Plan:  See above for treatment plan  Reason Skilled Services are Required: continued decreased standing balance and decreased independence with gait  Willow Ora, PTA, [place] [place] [address] [place], [place] [address] [phone] 08/31/16, 12:27 PM   Name: KEMARION ABBEY MRN: [id number] Date of Birth: 11/28/1933  Above G code and PT progress note completed by Guido Sander, PT  Guido Sander, [place] [address] [place], [place] [address] [phone]

## 2016-09-02 ENCOUNTER — Ambulatory Visit: Payer: Medicare Other | Admitting: Physical Therapy

## 2016-09-02 ENCOUNTER — Encounter: Payer: Self-pay | Admitting: Physical Therapy

## 2016-09-02 DIAGNOSIS — R2681 Unsteadiness on feet: Secondary | ICD-10-CM | POA: Diagnosis not present

## 2016-09-02 DIAGNOSIS — M6281 Muscle weakness (generalized): Secondary | ICD-10-CM

## 2016-09-02 DIAGNOSIS — R2689 Other abnormalities of gait and mobility: Secondary | ICD-10-CM

## 2016-09-02 NOTE — Patient Instructions (Addendum)
Fall Prevention in the Home Falls can cause injuries and can affect people from all age groups. There are many simple things that you can do to make your home safe and to help prevent falls. What can I do on the outside of my home?  Regularly repair the edges of walkways and driveways and fix any cracks.  Remove high doorway thresholds.  Trim any shrubbery on the main path into your home.  Use bright outdoor lighting.  Clear walkways of debris and clutter, including tools and rocks.  Regularly check that handrails are securely fastened and in good repair. Both sides of any steps should have handrails.  Install guardrails along the edges of any raised decks or porches.  Have leaves, snow, and ice cleared regularly.  Use sand or salt on walkways during winter months.  In the garage, clean up any spills right away, including grease or oil spills. What can I do in the bathroom?  Use night lights.  Install grab bars by the toilet and in the tub and shower. Do not use towel bars as grab bars.  Use non-skid mats or decals on the floor of the tub or shower.  If you need to sit down while you are in the shower, use a plastic, non-slip stool.  Keep the floor dry. Immediately clean up any water that spills on the floor.  Remove soap buildup in the tub or shower on a regular basis.  Attach bath mats securely with double-sided non-slip rug tape.  Remove throw rugs and other tripping hazards from the floor. What can I do in the bedroom?  Use night lights.  Make sure that a bedside light is easy to reach.  Do not use oversized bedding that drapes onto the floor.  Have a firm chair that has side arms to use for getting dressed.  Remove throw rugs and other tripping hazards from the floor. What can I do in the kitchen?  Clean up any spills right away.  Avoid walking on wet floors.  Place frequently used items in easy-to-reach places.  If you need to reach for something above  you, use a sturdy step stool that has a grab bar.  Keep electrical cables out of the way.  Do not use floor polish or wax that makes floors slippery. If you have to use wax, make sure that it is non-skid floor wax.  Remove throw rugs and other tripping hazards from the floor. What can I do in the stairways?  Do not leave any items on the stairs.  Make sure that there are handrails on both sides of the stairs. Fix handrails that are broken or loose. Make sure that handrails are as long as the stairways.  Check any carpeting to make sure that it is firmly attached to the stairs. Fix any carpet that is loose or worn.  Avoid having throw rugs at the top or bottom of stairways, or secure the rugs with carpet tape to prevent them from moving.  Make sure that you have a light switch at the top of the stairs and the bottom of the stairs. If you do not have them, have them installed. What are some other fall prevention tips?  Wear closed-toe shoes that fit well and support your feet. Wear shoes that have rubber soles or low heels.  When you use a stepladder, make sure that it is completely opened and that the sides are firmly locked. Have someone hold the ladder while you are using   it. Do not climb a closed stepladder.  Add color or contrast paint or tape to grab bars and handrails in your home. Place contrasting color strips on the first and last steps.  Use mobility aids as needed, such as canes, walkers, scooters, and crutches.  Turn on lights if it is dark. Replace any light bulbs that burn out.  Set up furniture so that there are clear paths. Keep the furniture in the same spot.  Fix any uneven floor surfaces.  Choose a carpet design that does not hide the edge of steps of a stairway.  Be aware of any and all pets.  Review your medicines with your healthcare provider. Some medicines can cause dizziness or changes in blood pressure, which increase your risk of falling. Talk with  your health care provider about other ways that you can decrease your risk of falls. This may include working with a physical therapist or trainer to improve your strength, balance, and endurance. This information is not intended to replace advice given to you by your health care provider. Make sure you discuss any questions you have with your health care provider. Document Released: 06/18/2002 Document Revised: 11/25/2015 Document Reviewed: 08/02/2014 Elsevier Interactive Patient Education  2017 [employer]retch    Place one leg forward, bent, other leg behind and straight. Lean forward keeping back heel flat. Hold ___30_ seconds while counting out loud. Repeat with other leg. Repeat ___2_ times. Do _1___ sessions per day.  http://gt2.exer.us/511   Copyright  VHI. All rights reserved.

## 2016-09-02 NOTE — Therapy (Signed)
[facility] [address] [place], [place], [address] Phone: [phone]   Fax:  [phone]  Physical Therapy Treatment  Patient Details  Name: DONAVON KIMREY MRN: [id number] Date of Birth: February 20, 1934 Referring Provider: Dr. Alger Simons  Encounter Date: 09/02/2016      PT End of Session - 09/02/16 2010    Visit Number 11   Number of Visits 17   Date for PT Re-Evaluation 09/17/16   Authorization Type [employer]-GCODE required every 10th visit   PT Start Time 0850   PT Stop Time 0934   PT Time Calculation (min) 44 min   Equipment Utilized During Treatment Gait belt   Activity Tolerance Patient tolerated treatment well   Behavior During Therapy [facility] for tasks assessed/performed      Past Medical History:  Diagnosis Date  . Atrial fibrillation ([place])    Dr. Tollie Eth  . BPH (benign prostatic hypertrophy) 2012   prior therapy with finasteride, but as of 03/2015 no c/o; Dr. Luanne Bras  . Cellulitis of face 11/08   [facility]NT  . CVA (cerebral infarction) 2009, 2015  . Echocardiogram abnormal 02/11/2010   LVEF 70%, severe mitral regurgitation, pulm HTN   . ED (erectile dysfunction)    Viagra prn, consult with Urology 2012  . Elevated PSA 2012   BPH; Dr. Luanne Bras, [facility]; no more PSAs needed as of 2012 consult  . Gait abnormality    uses cane, s/p CVA  . Hemiparesis affecting left side as late effect of cerebrovascular accident ([place])   . Hiatal hernia    per 2008 EUS  . Hyperlipidemia   . Hypertension   . Microscopic hematuria    chronic, eval 2012 with Urology, benign at that time  . Obstructive uropathy 2012   Dr. Luanne Bras  . Pancreatic mass 01/2006   inconsequential per CT 2007  . 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 Patent foramen ovale    hx/o, notation of fistula on echocardiogram from 2009  . Prophylactic antibiotic 2016   requires due to valve disease  . Schatzki's ring 2008   per endoscopic ultrasound; Dr. Janetta Hora. [name]  . Stroke ([place])   . Ventral hernia    surgery consult 01/2010 with Dr. Excell Seltzer, elective repair if desired , but he declined  . Wears hearing aid     Past Surgical History:  Procedure Laterality Date  . CARDIAC CATHETERIZATION  02/2010   LVEF 70%, severe mitral regurgitation, pulm HTN; prior in 01/2006  . INGUINAL HERNIA REPAIR     bilat  . MITRAL VALVE REPAIR  01/2006   mitral repair with Maze procedure, tricuspid annuloplasty, MAZE and LAA oversewn by Dr. Roxy Manns    There were no vitals filed for this visit.      Subjective Assessment - 09/02/16 0854    Subjective No new complaints. Has been to gym twice this week. No problems there.    Patient is accompained by: Family member   Pertinent History old Rt CVA with Lt spastic hemiparesis; new Lt CVA with Rt hemiparesis   Patient Stated Goals walking and balance to improve   Currently in Pain? No/denies                         [facility] Adult PT Treatment/Exercise - 09/02/16 1959      Transfers   Transfers Sit to Stand;Stand to [employer]  Sit to Stand 4: Min guard;Without upper extremity assist;From bed   Stand to Sit 6: Modified independent (Device/Increase time)   Number of Reps 10 reps;Other sets (comment)  3 sets; feet even; RLE on 4"step; LLE on 4"step   Comments incr ant-post sway with some reps with feet on unlevel surface; pt with near need for assist to correct     Ambulation/Gait   Ambulation/Gait Yes   Ambulation/Gait Assistance 5: Supervision;4: Min guard   Ambulation/Gait Assistance Details cues for rt hip extension/stability in stance; vc for incr step length; no catching foot   Ambulation Distance (Feet) 220 Feet  110, 70   Assistive device Straight cane;None   Gait Pattern Step-through pattern;Decreased arm swing - left;Decreased step length - right;Decreased step length -  left;Decreased hip/knee flexion - left;Decreased dorsiflexion - left;Decreased weight shift to right;Lateral hip instability   Ambulation Surface Level;Indoor   Gait velocity decreases with no device, steps shorten     Posture/Postural Control   Posture/Postural Control Postural limitations   Postural Limitations Forward head;Decreased lumbar lordosis;Rounded Shoulders   Posture Comments addressed throughout session with pt able to partially reverse     Knee/Hip Exercises: Standing   Forward Lunges Both;1 set;10 reps;Limitations   Forward Lunges Limitations bending trailing leg due to tight hip flexors, limited step length   Hip Abduction Stengthening;Both;2 sets;10 reps;Knee straight  light bil UE support via counter   Other Standing Knee Exercises walking sideways 8' x 6; vc for incr step length and light UE support                PT Education - 09/02/16 2010    Education provided Yes   Education Details Added bil heel cord stretch in standing   Person(s) Educated Spouse;Patient   Methods Explanation;Demonstration;Tactile cues;Verbal cues;Handout   Comprehension Verbalized understanding;Returned demonstration;Verbal cues required;Tactile cues required;Need further instruction          PT Short Term Goals - 08/24/16 1744      PT SHORT TERM GOAL #1   Title Pt will perform HEP with family's supervision for improved strength, balance, and gait.  TARGET 08/18/16   Baseline met 08-24-16 per pt report   Status Achieved     PT SHORT TERM GOAL #2   Title Pt will improve TUG score to less than or equal to 25 seconds for decreased fall risk.   Status Partially Met     PT SHORT TERM GOAL #3   Title Pt will improve Berg Balance score to at least 45/56 for decreased fall risk.   Status Partially Met     PT SHORT TERM GOAL #4   Title Pt will ambulate at least 300 ft using appropriate assistive device (RW versus cane) modified independently, for improved safety with gait.    Status Partially Met     PT SHORT TERM GOAL #5   Title Pt will negotiate at least 7 steps with one handrail, modified independently for improved safety with stair negotiation in the home.   Status Partially Met     PT SHORT TERM GOAL #6   Title Pt/family will verbalize understanding of CVA education, including warning signs and symptoms.   Status Achieved           PT Long Term Goals - 08/31/16 1225      PT LONG TERM GOAL #1   Title Pt/family will verbalize understanding of fall prevention in the home environment.  TARGET 09/17/16   Time 8   Period Weeks  Status On-going     PT LONG TERM GOAL #2   Title Pt will improve TUG score to less than or equal to 22 seconds for decreased fall risk.   Baseline 08/31/16: met with 16.56 with cane today   Status Achieved     PT LONG TERM GOAL #3   Title Pt will improve gait velocity to at least 1.8 ft/sec for improved gait efficiency and safety.   Baseline 08/31/16: met today with 1.82 ft/sec with cane   Status Achieved     PT LONG TERM GOAL #4   Title Pt will ambulate at least 50-100 ft, household distances, with cane, with supervision for improved household mobility.   Time 8   Period Weeks   Status On-going     PT LONG TERM GOAL #5   Title Pt will report at least 20% improvement in STroke IS mobiltiy scale on FOTO to demonstrate improved functional mobility.   Time 8   Period Weeks   Status On-going               Plan - 09/02/16 2011    Clinical Impression Statement Skilled session focused on bil LE strengthening and ROM to improve safety and balance with gait. Patient is ambulating inside his gym without cane (against PT recommendation) and therefore continue to work on ambulation without cane for improving safety.    Rehab Potential Good   PT Frequency 2x / week   PT Duration 8 weeks   PT Treatment/Interventions ADLs/Self Care Home Management;Functional mobility training;Gait training;Stair training;Neuromuscular  re-education;Patient/family education;Balance training;Therapeutic exercise;Therapeutic activities;Orthotic Fit/Training;DME Instruction   PT Next Visit Plan Has 4 visits left before 60 day certification ends (1/6)--XWRU thinks d/c and not extend; Cont gait training with/without cane with obstacles, turns (Lt foot clearance, lt knee flexion in swing, decr posterior lean, rt hip stability); add sit to stand HEP; stepping strategy; cont balance training (FYI, pt has refused all braces, toe cap, etc for left leg)    PT Home Exercise Plan SLS with heel raise(2/22 d/c'd to add bil heelcord stretch); seated hip abdct with red band; sideways walking at counter; standing hip abdct, hip flexion with red theraband    Consulted and Agree with Plan of Care Patient;Family member/caregiver   Family Member Consulted wife      Patient will benefit from skilled therapeutic intervention in order to improve the following deficits and impairments:  Abnormal gait, Decreased balance, Decreased mobility, Decreased safety awareness, Decreased strength, Difficulty walking, Decreased activity tolerance  Visit Diagnosis: Other abnormalities of gait and mobility  Muscle weakness (generalized)  Unsteadiness on feet     Problem List Patient Active Problem List   Diagnosis Date Noted  . Urinary frequency 07/20/2016  . Subcortical infarction ([place]) 07/09/2016  . PFO (patent foramen ovale)   . History of CVA with residual deficit   . Benign essential HTN   . Non compliance with medical treatment   . Hypokalemia   . Acute blood loss anemia   . Acute CVA (cerebrovascular accident) ([place]) 07/05/2016  . Embolic stroke involving left middle cerebral artery ([place])   . TIA (transient ischemic attack) 07/03/2016  . Right sided weakness 07/03/2016  . Microscopic hematuria 04/03/2015  . Hyperlipidemia 04/03/2015  . Wears hearing aid 04/03/2015  . Changing skin lesion 04/03/2015  . Paresthesia and pain of left extremity  04/02/2015  . Encounter for health maintenance examination in adult 04/02/2015  . Vaccine counseling 04/02/2015  . Hernia of abdominal cavity 04/02/2015  .  Impaired gait and mobility 04/02/2015  . Leg swelling 03/03/2015  . Essential hypertension 03/03/2015  . Spastic hemiplegia affecting nondominant side ([place]) 12/14/2013  . ICH (intracerebral hemorrhage) ([place]) 08/02/2013  . Hx of mitral valve repair   . History of stroke   . History of atrial fibrillation   . Cyst and pseudocyst of pancreas 09/13/2007  . PVC (premature ventricular contraction)   . History of renal cyst   . BPH (benign prostatic hypertrophy)     Rexanne Mano, PT 09/02/2016, 8:26 PM  [facility] [address] [place], [place], [address] Phone: [phone]   Fax:  [phone]  Name: HARON BEILKE MRN: [id number] Date of Birth: 11/06/1933

## 2016-09-07 ENCOUNTER — Encounter: Payer: Self-pay | Admitting: Physical Therapy

## 2016-09-07 ENCOUNTER — Ambulatory Visit (INDEPENDENT_AMBULATORY_CARE_PROVIDER_SITE_OTHER): Payer: Medicare Other | Admitting: Nurse Practitioner

## 2016-09-07 ENCOUNTER — Encounter: Payer: Self-pay | Admitting: Nurse Practitioner

## 2016-09-07 ENCOUNTER — Other Ambulatory Visit (INDEPENDENT_AMBULATORY_CARE_PROVIDER_SITE_OTHER): Payer: Medicare Other

## 2016-09-07 ENCOUNTER — Ambulatory Visit: Payer: Medicare Other | Admitting: Physical Therapy

## 2016-09-07 VITALS — BP 124/74 | HR 85 | Temp 97.7°F | Ht 62.0 in | Wt 152.0 lb

## 2016-09-07 DIAGNOSIS — R2689 Other abnormalities of gait and mobility: Secondary | ICD-10-CM

## 2016-09-07 DIAGNOSIS — R2681 Unsteadiness on feet: Secondary | ICD-10-CM | POA: Diagnosis not present

## 2016-09-07 DIAGNOSIS — I10 Essential (primary) hypertension: Secondary | ICD-10-CM | POA: Diagnosis not present

## 2016-09-07 DIAGNOSIS — E876 Hypokalemia: Secondary | ICD-10-CM | POA: Diagnosis not present

## 2016-09-07 DIAGNOSIS — M7989 Other specified soft tissue disorders: Secondary | ICD-10-CM | POA: Diagnosis not present

## 2016-09-07 DIAGNOSIS — M6281 Muscle weakness (generalized): Secondary | ICD-10-CM

## 2016-09-07 LAB — BASIC METABOLIC PANEL
BUN: 17 mg/dL (ref 6–23)
CHLORIDE: 98 meq/L (ref 96–112)
CO2: 32 mEq/L (ref 19–32)
CREATININE: 0.89 mg/dL (ref 0.40–1.50)
Calcium: 9.7 mg/dL (ref 8.4–10.5)
GFR: 87.27 mL/min (ref >60.00)
Glucose, Bld: 91 mg/dL (ref 70–99)
Potassium: 3.2 mEq/L — ABNORMAL LOW (ref 3.5–5.1)
Sodium: 138 mEq/L (ref 135–145)

## 2016-09-07 NOTE — Patient Instructions (Addendum)
I advised Jack [name] about risk of recurrent angioedema with use of valsartan. I recommended for patient not to resume medication.  Wear compression stocking during during the day and off at night.  Take amlodipine at bedtime. Start tomorrow  Go to lab for blood draw.  Increased HCTZ and potassium dose today  maintain low salt diet.

## 2016-09-07 NOTE — Therapy (Signed)
[facility] [address] [place], [place], [address] Phone: [phone]   Fax:  [phone]  Physical Therapy Treatment  Patient Details  Name: Jack [name] MRN: [id number] Date of Birth: 01/27/1934 Referring Provider: Dr. Alger Simons  Encounter Date: 09/07/2016      PT End of Session - 09/07/16 1241    Visit Number 12   Number of Visits 17   Date for PT Re-Evaluation 09/17/16   Authorization Type [employer] Medicare-GCODE required every 10th visit   PT Start Time 0845   PT Stop Time 0934   PT Time Calculation (min) 49 min   Equipment Utilized During Treatment Gait belt   Activity Tolerance Patient tolerated treatment well   Behavior During Therapy [facility] for tasks assessed/performed      Past Medical History:  Diagnosis Date  . Atrial fibrillation ([place])    Dr. Tollie Eth  . BPH (benign prostatic hypertrophy) 2012   prior therapy with finasteride, but as of 03/2015 no c/o; Dr. Luanne Bras  . Cellulitis of face 11/08   [facility]  . CVA (cerebral infarction) 2009, 2015  . Echocardiogram abnormal 02/11/2010   LVEF 70%, severe mitral regurgitation, pulm HTN   . ED (erectile dysfunction)    Viagra prn, consult with Urology 2012  . Elevated PSA 2012   BPH; Dr. Luanne Bras, [facility]; no more PSAs needed as of 2012 consult  . Gait abnormality    uses cane, s/p CVA  . Hemiparesis affecting left side as late effect of cerebrovascular accident ([place])   . Hiatal hernia    per 2008 EUS  . Hyperlipidemia   . Hypertension   . Microscopic hematuria    chronic, eval 2012 with Urology, benign at that time  . Obstructive uropathy 2012   Dr. Luanne Bras  . Pancreatic mass 01/2006   inconsequential per CT 2007  . 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 Patent foramen ovale    hx/o, notation of fistula on echocardiogram from 2009  . Prophylactic antibiotic 2016    requires due to valve disease  . Schatzki's ring 2008   per endoscopic ultrasound; Dr. Janetta Hora. [name]  . Stroke ([place])   . Ventral hernia    surgery consult 01/2010 with Dr. Excell Seltzer, elective repair if desired , but he declined  . Wears hearing aid     Past Surgical History:  Procedure Laterality Date  . CARDIAC CATHETERIZATION  02/2010   LVEF 70%, severe mitral regurgitation, pulm HTN; prior in 01/2006  . INGUINAL HERNIA REPAIR     bilat  . MITRAL VALVE REPAIR  01/2006   mitral repair with Maze procedure, tricuspid annuloplasty, MAZE and LAA oversewn by Dr. Roxy Manns    There were no vitals filed for this visit.      Subjective Assessment - 09/07/16 1220    Subjective Reports no falls. Feels he is about the same with balance and walking.    Patient is accompained by: Family member   Pertinent History old Rt CVA with Lt spastic hemiparesis; new Lt CVA with Rt hemiparesis   Patient Stated Goals walking and balance to improve   Currently in Pain? No/denies                         [facility] Adult PT Treatment/Exercise - 09/07/16 1220      Transfers   Transfers Sit to Stand;Stand to Sit  Sit to Stand Without upper extremity assist;From bed;5: Supervision   Sit to Stand Details (indicate cue type and reason) occasional use of legs against surface; vc to correctt   Stand to Sit 6: Modified independent (Device/Increase time);Without upper extremity assist   Number of Reps 10 reps;1 set     Ambulation/Gait   Ambulation/Gait Assistance 4: Min guard   Ambulation/Gait Assistance Details Lt foot scuffs along floor (did not cause LOB however has previously, therefore close guarding) vc for lt knee flexion during swing and standing tall on Rt hip; significantly decr velocity when cognitive/dual task demands added while walking   Ambulation Distance (Feet) 220 Feet  70, 70   Assistive device Straight cane   Gait Pattern Step-through pattern;Decreased  arm swing - left;Decreased step length - right;Decreased step length - left;Decreased hip/knee flexion - left;Decreased dorsiflexion - left;Decreased weight shift to right;Lateral hip instability   Ambulation Surface Level;Indoor   Stairs Yes   Stairs Assistance 6: Modified independent (Device/Increase time)   Stairs Assistance Details (indicate cue type and reason) attempted to educate approp use of cane on steps, however pt reports he uses rail and carries cane (he does get out of sequence)   Stair Management Technique One rail Left;Forwards;Alternating pattern   Number of Stairs 8   Height of Stairs 6   Gait Comments Ambulated obstacle course (step over black foam 4", up/over 6" step, around obstacles with wide turns vs tight) no LOB or trip due to left foot     Posture/Postural Control   Posture/Postural Control Postural limitations   Postural Limitations Forward head;Decreased lumbar lordosis;Rounded Shoulders   Posture Comments addressed throughout session with pt able to partially reverse             Balance Exercises - 09/07/16 1230      Balance Exercises: Standing   Stepping Strategy Anterior;Posterior;Lateral;Foam/compliant surface;UE support;10 reps  each leg, each direction; intermittent UE assist // bars   Step Ups 6 inch  with cane, forward and backward (10 total)   Other Standing Exercises // bars "BAPS" board, unable to hold level with single UE assist, +bil UE assist x 30 sec; bil UE support had pt tip board to touch outer edge to floor x 4 directions           PT Education - 09/07/16 1933    Education provided Yes   Education Details Added walking along countertop in his kitchen practicing "standing tall" on RLE and swinging thru LLE (x5 laps)   Person(s) Educated Patient;Spouse   Methods Explanation;Demonstration;Tactile cues;Verbal cues   Comprehension Verbalized understanding;Returned demonstration;Verbal cues required;Tactile cues required;Need further  instruction          PT Short Term Goals - 08/24/16 1744      PT SHORT TERM GOAL #1   Title Pt will perform HEP with family's supervision for improved strength, balance, and gait.  TARGET 08/18/16   Baseline met 08-24-16 per pt report   Status Achieved     PT SHORT TERM GOAL #2   Title Pt will improve TUG score to less than or equal to 25 seconds for decreased fall risk.   Status Partially Met     PT SHORT TERM GOAL #3   Title Pt will improve Berg Balance score to at least 45/56 for decreased fall risk.   Status Partially Met     PT SHORT TERM GOAL #4   Title Pt will ambulate at least 300 ft using appropriate assistive device (RW versus  cane) modified independently, for improved safety with gait.   Status Partially Met     PT SHORT TERM GOAL #5   Title Pt will negotiate at least 7 steps with one handrail, modified independently for improved safety with stair negotiation in the home.   Status Partially Met     PT SHORT TERM GOAL #6   Title Pt/family will verbalize understanding of CVA education, including warning signs and symptoms.   Status Achieved           PT Long Term Goals - 09/07/16 1942      PT LONG TERM GOAL #1   Title Pt/family will verbalize understanding of fall prevention in the home environment.  TARGET 09/17/16   Time 8   Period Weeks   Status On-going     PT LONG TERM GOAL #2   Title Pt will improve TUG score to less than or equal to 22 seconds for decreased fall risk.   Baseline 08/31/16: met with 16.56 with cane today   Status Achieved     PT LONG TERM GOAL #3   Title Pt will improve gait velocity to at least 1.8 ft/sec for improved gait efficiency and safety.   Baseline 08/31/16: met today with 1.82 ft/sec with cane   Status Achieved     PT LONG TERM GOAL #4   Title Pt will ambulate at least 50-100 ft, household distances, with cane, with supervision for improved household mobility.   Baseline 2/27 completed with no LOB throughout session; looking  for consistency to state "met"   Time 8   Period Weeks   Status On-going     PT LONG TERM GOAL #5   Title Pt will report at least 20% improvement in STroke IS mobiltiy scale on FOTO to demonstrate improved functional mobility.   Time 8   Period Weeks   Status On-going               Plan - 09/07/16 1935    Clinical Impression Statement Skilled session focused on gait training (including pre-gait activities to improve LLE swing/foot clearance; negotiating around and over obstacles; dual task--cognitive with walking), balance and strengthening. Patient with no instances of lt foot catching/causing LOB this session (although his lt foot frequently "scuffs/drags" across floor). Will continue to benefit from PT to work towards [place].    Rehab Potential Good   PT Frequency 2x / week   PT Duration 8 weeks   PT Treatment/Interventions ADLs/Self Care Home Management;Functional mobility training;Gait training;Stair training;Neuromuscular re-education;Patient/family education;Balance training;Therapeutic exercise;Therapeutic activities;Orthotic Fit/Training;DME Instruction   PT Next Visit Plan Cont gait training with/without cane with obstacles, uneven terrain, rt hip extension/abduction in stance to allow improved Lt foot clearance (lt knee flexion in swing, decr posterior lean, rt hip stability); add sit to stand HEP; stepping strategy; (FYI, pt has refused all braces, toe cap, etc for left leg)    PT Home Exercise Plan SLS with heel raise(2/22 d/c'd to add bil heelcord stretch); seated hip abdct with red band; sideways walking at counter; standing hip abdct, hip flexion with red theraband    Consulted and Agree with Plan of Care Patient;Family member/caregiver   Family Member Consulted wife      Patient will benefit from skilled therapeutic intervention in order to improve the following deficits and impairments:  Abnormal gait, Decreased balance, Decreased mobility, Decreased safety awareness,  Decreased strength, Difficulty walking, Decreased activity tolerance  Visit Diagnosis: Other abnormalities of gait and mobility  Muscle weakness (generalized)  Unsteadiness on feet     Problem List Patient Active Problem List   Diagnosis Date Noted  . Urinary frequency 07/20/2016  . Subcortical infarction ([place]) 07/09/2016  . PFO (patent foramen ovale)   . History of CVA with residual deficit   . Benign essential HTN   . Non compliance with medical treatment   . Hypokalemia   . Acute blood loss anemia   . Acute CVA (cerebrovascular accident) ([place]) 07/05/2016  . Embolic stroke involving left middle cerebral artery ([place])   . TIA (transient ischemic attack) 07/03/2016  . Right sided weakness 07/03/2016  . Microscopic hematuria 04/03/2015  . Hyperlipidemia 04/03/2015  . Wears hearing aid 04/03/2015  . Changing skin lesion 04/03/2015  . Paresthesia and pain of left extremity 04/02/2015  . Encounter for health maintenance examination in adult 04/02/2015  . Vaccine counseling 04/02/2015  . Hernia of abdominal cavity 04/02/2015  . Impaired gait and mobility 04/02/2015  . Leg swelling 03/03/2015  . Essential hypertension 03/03/2015  . Spastic hemiplegia affecting nondominant side ([place]) 12/14/2013  . ICH (intracerebral hemorrhage) ([place]) 08/02/2013  . Hx of mitral valve repair   . History of stroke   . History of atrial fibrillation   . Cyst and pseudocyst of pancreas 09/13/2007  . PVC (premature ventricular contraction)   . History of renal cyst   . BPH (benign prostatic hypertrophy)     Rexanne Mano, PT 09/07/2016, 7:45 PM  [facility] [address] [place], [place], [address] Phone: [phone]   Fax:  [phone]  Name: MIR FULLILOVE MRN: [id number] Date of Birth: Nov 24, 1933

## 2016-09-07 NOTE — Progress Notes (Signed)
Subjective:  Patient ID: Jack [name], male    DOB: [birth year]  Age: 83 y.o. MRN: [id number]  CC: Follow-up (1 mo fu/still at time.hydrochlorathyzide dosage consult? amlodipine dosage?)   HPI   Edema: Mild improvement with leg elevation, compression stocking, and use of HCTZ. He will like HCTZ dose increased.  HTN: He states that he is not convinced that he is allergic to valsartan. He developed upper lip swelling 02/24/15, pcp at the time related it to angioedema caused by valsartan, so HTN medications were changed to amlodipine and HCTZ. He then developed LE edema 03/03/15 which has not improved since then despite use of HCTZ. He states that he used valsartan for an additional 1week after he was instructed to stop 02/25/15, and his symptoms did not worsen. He will like to resume valsartan and stop amlodipine which seems to be the cause of his edema.  Outpatient Medications Prior to Visit  Medication Sig Dispense Refill  . acetaminophen (TYLENOL) 325 MG tablet Take 1-2 tablets (325-650 mg total) by mouth every 4 (four) hours as needed for mild pain.    Marland Kitchen aspirin EC 325 MG EC tablet Take 1 tablet (325 mg total) by mouth daily. 30 tablet 0  . cholecalciferol (VITAMIN D) 400 units TABS tablet Take 400 Units by mouth daily.    Marland Kitchen MAGNESIUM PO Take 1 tablet by mouth daily.     Marland Kitchen omeprazole (PRILOSEC) 20 MG capsule Take 1 capsule (20 mg total) by mouth daily. 30 capsule 0  . pravastatin (PRAVACHOL) 40 MG tablet Take 1 tablet (40 mg total) by mouth daily at 6 PM. 90 tablet 1  . amLODipine (NORVASC) 10 MG tablet Take 1 tablet (10 mg total) by mouth daily. 90 tablet 3  . hydrochlorothiazide (MICROZIDE) 12.5 MG capsule Take 1 capsule (12.5 mg total) by mouth daily. 90 capsule 1  . potassium chloride (K-DUR) 10 MEQ tablet Take 1 tablet (10 mEq total) by mouth daily. 90 tablet 1  . albuterol (PROVENTIL HFA;VENTOLIN HFA) 108 (90 Base) MCG/ACT inhaler Inhale 2 puffs into the lungs every 6 (six)  hours as needed for wheezing or shortness of breath. (Patient not taking: Reported on 09/07/2016) 1 Inhaler 0  . umeclidinium-vilanterol (ANORO ELLIPTA) 62.5-25 MCG/INH AEPB Inhale 1 puff into the lungs daily. (Patient not taking: Reported on 09/07/2016) 1 each 0   No facility-administered medications prior to visit.     ROS See HPI  Objective:  BP 124/74   Pulse 85   Temp 97.7 F (36.5 C)   Ht 5\' 2"  (1.575 m)   Wt 152 lb (68.9 kg)   SpO2 98%   BMI 27.80 kg/m   BP Readings from Last 3 Encounters:  09/07/16 124/74  08/19/16 131/66  08/12/16 120/76    Wt Readings from Last 3 Encounters:  09/07/16 152 lb (68.9 kg)  08/06/16 149 lb 4 oz (67.7 kg)  07/27/16 140 lb (63.5 kg)    Physical Exam  Constitutional: He is oriented to person, place, and time. No distress.  Neck: Normal range of motion. Neck supple.  Cardiovascular: Normal rate.   Murmur heard. arrhythmia  Pulmonary/Chest: Effort normal and breath sounds normal.  Musculoskeletal: He exhibits edema. He exhibits no tenderness.  Neurological: He is alert and oriented to person, place, and time.  Skin: Skin is warm and dry. No rash noted. No erythema.  Vitals reviewed.  BMP Latest Ref Rng & Units 09/07/2016 07/27/2016 07/16/2016  Glucose 70 - 99 mg/dL 91 106(H) -  BUN 6 - 23 mg/dL 17 15 -  Creatinine 0.40 - 1.50 mg/dL 0.89 0.75 1.02  Sodium 135 - 145 mEq/L 138 133(L) -  Potassium 3.5 - 5.1 mEq/L 3.2(L) 3.2(L) -  Chloride 96 - 112 mEq/L 98 92(L) -  CO2 19 - 32 mEq/L 32 30 -  Calcium 8.4 - 10.5 mg/dL 9.7 9.0 -    Dg Chest 2 View  Result Date: 07/27/2016 CLINICAL DATA:  SOB and productive cough x 3 days with audible wheezing. - yellow No pain EXAM: CHEST  2 VIEW COMPARISON:  Chest x-ray dated 07/27/2016. FINDINGS: Heart size and mediastinal contours are stable. Median sternotomy wires appear intact and stable in alignment. Valvular hardware appears stable in position. Lungs are clear. No pleural effusion or pneumothorax  seen. No acute or suspicious osseous finding. IMPRESSION: No active cardiopulmonary disease. No evidence of pneumonia or pulmonary edema. Electronically Signed   By: Franki Cabot M.D.   On: 07/27/2016 21:02   Dg Chest 2 View  Result Date: 07/27/2016 CLINICAL DATA:  Wheezing and dyspnea EXAM: CHEST  2 VIEW COMPARISON:  03/21/2006 CXR FINDINGS: Stable cardiomegaly with aortic atherosclerosis. The patient is status median sternotomy with cardiac valvular replacement. Atelectasis at each lung base left greater than right. No confluent airspace disease or overt pulmonary edema. Probable small hiatal hernia. No acute osseous abnormality. IMPRESSION: No active cardiopulmonary disease.  Bibasilar atelectasis. Electronically Signed   By: Ashley Royalty M.D.   On: 07/27/2016 18:03   Ct Angio Chest Pe W And/or Wo Contrast  Result Date: 07/28/2016 CLINICAL DATA:  Cough and shortness of breath.  Elevated D-dimer. EXAM: CT ANGIOGRAPHY CHEST WITH CONTRAST TECHNIQUE: Multidetector CT imaging of the chest was performed using the standard protocol during bolus administration of intravenous contrast. Multiplanar CT image reconstructions and MIPs were obtained to evaluate the vascular anatomy. CONTRAST:  100 cc Isovue 370 COMPARISON:  Radiographs earlier this day. FINDINGS: Cardiovascular: There are no filling defects within the pulmonary arteries to suggest pulmonary embolus. Atherosclerosis and tortuosity of the thoracic aorta without evidence of dissection. Post mitral and tricuspid valve replacements. Multi chamber cardiomegaly. There are coronary artery calcifications. Mediastinum/Nodes: No enlarged mediastinal or hilar lymph nodes. Patulous esophagus with small hiatal hernia. Trachea is patent. Questionable linear debris/ secretions within the left lower lobe bronchus, partially obscured by motion. Lungs/Pleura: Narrowing of the left lower lobe bronchus due to compression from the pulmonary artery and adjacent aorta.  Scattered atelectasis or scarring in the left lower lobe. No confluent airspace disease. No evidence of pulmonary edema. No pleural fluid. Upper Abdomen: No acute abnormality. Musculoskeletal: Post median sternotomy. There are no acute or suspicious osseous abnormalities. Pseudoarthrosis between lateral right first and second rib, may be sequela of remote injury. Review of the MIP images confirms the above findings. IMPRESSION: 1. No pulmonary embolus. 2. Questionable linear debris/ secretions in the left lower lobe bronchus, partially obscured by motion artifact. 3. Atherosclerosis including coronary artery calcifications. Prosthetic mitral and tricuspid valves with mild multi chamber cardiomegaly. Electronically Signed   By: Jeb Levering M.D.   On: 07/28/2016 00:29    Assessment & Plan:   Jack [name] was seen today for follow-up.  Diagnoses and all orders for this visit:  Benign essential HTN -     Basic metabolic panel; Future -     amLODipine (NORVASC) 10 MG tablet; Take 1 tablet (10 mg total) by mouth at bedtime.  Hypokalemia -     Basic metabolic panel; Future -  potassium chloride SA (K-DUR,KLOR-CON) 20 MEQ tablet; Take 1 tablet (20 mEq total) by mouth daily.  Leg swelling -     hydrochlorothiazide (HYDRODIURIL) 25 MG tablet; Take 1 tablet (25 mg total) by mouth daily.  Essential hypertension -     hydrochlorothiazide (HYDRODIURIL) 25 MG tablet; Take 1 tablet (25 mg total) by mouth daily.   I have discontinued Mr. Cregar hydrochlorothiazide, potassium chloride, and umeclidinium-vilanterol. I have also changed his amLODipine. Additionally, I am having him start on potassium chloride SA and hydrochlorothiazide. Lastly, I am having him maintain his acetaminophen, MAGNESIUM PO, aspirin, cholecalciferol, pravastatin, omeprazole, and albuterol.  Meds ordered this encounter  Medications  . amLODipine (NORVASC) 10 MG tablet    Sig: Take 1 tablet (10 mg total) by mouth at bedtime.     Dispense:  90 tablet    Refill:  3    Order Specific Question:   Supervising Provider    Answer:   Cassandria Anger [[id number]]  . potassium chloride SA (K-DUR,KLOR-CON) 20 MEQ tablet    Sig: Take 1 tablet (20 mEq total) by mouth daily.    Dispense:  30 tablet    Refill:  5    Dose increased    Order Specific Question:   Supervising Provider    Answer:   Cassandria Anger [[id number]]  . hydrochlorothiazide (HYDRODIURIL) 25 MG tablet    Sig: Take 1 tablet (25 mg total) by mouth daily.    Dispense:  30 tablet    Refill:  5    Dose increased    Order Specific Question:   Supervising Provider    Answer:   Cassandria Anger [[id number]]    Follow-up: Return in about 3 months (around 12/05/2016) for CPE (fasting).  Wilfred Lacy, NP

## 2016-09-07 NOTE — Progress Notes (Signed)
Pre visit review using our clinic review tool, if applicable. No additional management support is needed unless otherwise documented below in the visit note. 

## 2016-09-08 MED ORDER — AMLODIPINE BESYLATE 10 MG PO TABS: 10.0000 mg | ORAL_TABLET | Freq: Every day | ORAL | 3 refills | Status: DC

## 2016-09-08 MED ORDER — HYDROCHLOROTHIAZIDE 25 MG PO TABS: 25.0000 mg | ORAL_TABLET | Freq: Every day | ORAL | 5 refills | Status: DC

## 2016-09-08 MED ORDER — POTASSIUM CHLORIDE CRYS ER 20 MEQ PO TBCR: 20.0000 meq | EXTENDED_RELEASE_TABLET | Freq: Every day | ORAL | 5 refills | Status: DC

## 2016-09-09 ENCOUNTER — Encounter: Payer: Self-pay | Admitting: Physical Therapy

## 2016-09-09 ENCOUNTER — Telehealth: Payer: Self-pay | Admitting: Medical

## 2016-09-09 ENCOUNTER — Encounter: Payer: Self-pay | Admitting: Neurology

## 2016-09-09 ENCOUNTER — Ambulatory Visit: Payer: Medicare Other | Attending: Physical Medicine & Rehabilitation | Admitting: Physical Therapy

## 2016-09-09 ENCOUNTER — Ambulatory Visit (INDEPENDENT_AMBULATORY_CARE_PROVIDER_SITE_OTHER): Payer: Medicare Other | Admitting: Neurology

## 2016-09-09 VITALS — BP 107/64 | HR 87 | Ht 62.0 in | Wt 150.0 lb

## 2016-09-09 DIAGNOSIS — R2689 Other abnormalities of gait and mobility: Secondary | ICD-10-CM

## 2016-09-09 DIAGNOSIS — I69353 Hemiplegia and hemiparesis following cerebral infarction affecting right non-dominant side: Secondary | ICD-10-CM | POA: Diagnosis present

## 2016-09-09 DIAGNOSIS — M6281 Muscle weakness (generalized): Secondary | ICD-10-CM

## 2016-09-09 DIAGNOSIS — I63412 Cerebral infarction due to embolism of left middle cerebral artery: Secondary | ICD-10-CM

## 2016-09-09 DIAGNOSIS — R2681 Unsteadiness on feet: Secondary | ICD-10-CM | POA: Diagnosis present

## 2016-09-09 NOTE — Patient Instructions (Signed)
I had a long d/w patient and his wife about his recent  Embolic stroke, atrial fibrillation, risk benefit of anticoagulation, watchman devicerisk for recurrent stroke/TIAs, personally independently reviewed imaging studies and stroke evaluation results and answered questions.Continue aspirin 325 mg daily  for secondary stroke prevention  as patient is scared to even go on short-term anticoagulation for watchman device procedure given his prior history of intracerebral hemorrhage in 2015 and maintain strict control of hypertension with blood pressure goal below 130/90, diabetes with hemoglobin A1c goal below 6.5% and lipids with LDL cholesterol goal below 70 mg/dL. I also advised the patient to eat a healthy diet with plenty of whole grains, cereals, fruits and vegetables, exercise regularly and maintain ideal body weight we also talked about fall and safety precautions. Followup in the future with my nurse practitioner in 6 months or call earlier if necessary  Fall Prevention in the [place] can cause injuries and can affect people from all age groups. There are many simple things that you can do to make your home safe and to help prevent falls. What can I do on the outside of my home?  Regularly repair the edges of walkways and driveways and fix any cracks.  Remove high doorway thresholds.  Trim any shrubbery on the main path into your home.  Use bright outdoor lighting.  Clear walkways of debris and clutter, including tools and rocks.  Regularly check that handrails are securely fastened and in good repair. Both sides of any steps should have handrails.  Install guardrails along the edges of any raised decks or porches.  Have leaves, snow, and ice cleared regularly.  Use sand or salt on walkways during winter months.  In the garage, clean up any spills right away, including grease or oil spills. What can I do in the bathroom?  Use night lights.  Install grab bars by the toilet and in  the tub and shower. Do not use towel bars as grab bars.  Use non-skid mats or decals on the floor of the tub or shower.  If you need to sit down while you are in the shower, use a plastic, non-slip stool.  Keep the floor dry. Immediately clean up any water that spills on the floor.  Remove soap buildup in the tub or shower on a regular basis.  Attach bath mats securely with double-sided non-slip rug tape.  Remove throw rugs and other tripping hazards from the floor. What can I do in the bedroom?  Use night lights.  Make sure that a bedside light is easy to reach.  Do not use oversized bedding that drapes onto the floor.  Have a firm chair that has side arms to use for getting dressed.  Remove throw rugs and other tripping hazards from the floor. What can I do in the kitchen?  Clean up any spills right away.  Avoid walking on wet floors.  Place frequently used items in easy-to-reach places.  If you need to reach for something above you, use a sturdy step stool that has a grab bar.  Keep electrical cables out of the way.  Do not use floor polish or wax that makes floors slippery. If you have to use wax, make sure that it is non-skid floor wax.  Remove throw rugs and other tripping hazards from the floor. What can I do in the stairways?  Do not leave any items on the stairs.  Make sure that there are handrails on both sides of the stairs.  Fix handrails that are broken or loose. Make sure that handrails are as long as the stairways.  Check any carpeting to make sure that it is firmly attached to the stairs. Fix any carpet that is loose or worn.  Avoid having throw rugs at the top or bottom of stairways, or secure the rugs with carpet tape to prevent them from moving.  Make sure that you have a light switch at the top of the stairs and the bottom of the stairs. If you do not have them, have them installed. What are some other fall prevention tips?  Wear closed-toe shoes  that fit well and support your feet. Wear shoes that have rubber soles or low heels.  When you use a stepladder, make sure that it is completely opened and that the sides are firmly locked. Have someone hold the ladder while you are using it. Do not climb a closed stepladder.  Add color or contrast paint or tape to grab bars and handrails in your home. Place contrasting color strips on the first and last steps.  Use mobility aids as needed, such as canes, walkers, scooters, and crutches.  Turn on lights if it is dark. Replace any light bulbs that burn out.  Set up furniture so that there are clear paths. Keep the furniture in the same spot.  Fix any uneven floor surfaces.  Choose a carpet design that does not hide the edge of steps of a stairway.  Be aware of any and all pets.  Review your medicines with your healthcare provider. Some medicines can cause dizziness or changes in blood pressure, which increase your risk of falling. Talk with your health care provider about other ways that you can decrease your risk of falls. This may include working with a physical therapist or trainer to improve your strength, balance, and endurance. This information is not intended to replace advice given to you by your health care provider. Make sure you discuss any questions you have with your health care provider. Document Released: 06/18/2002 Document Revised: 11/25/2015 Document Reviewed: 08/02/2014 Elsevier Interactive Patient Education  2017 [employer].

## 2016-09-09 NOTE — Telephone Encounter (Signed)
It appears he is seeing a different PCP.  Please remove me as PCP if he isn't returning.

## 2016-09-09 NOTE — Progress Notes (Signed)
[facility] [address] [place]. [place] [address] [phone]       OFFICE FOLLOW-UP NOTE  [name] Date of Birth:  08-25-1933 Medical Record Number:  [id number]   HPI: Mr Jack [name] is an 83 year Caucasian male seen today for first office follow-up visit for hospital admission for stroke in December 2015. He is accompanied by his wife. History is obtained from the patient, wife and review of Hospital medical records and I have personally reviewed imaging studies.Jack [name] an 83 y.o.malewith a history of stroke 2 years ago with residual left hemiparesis, hypertension, hyperlipidemia and atrial fibrillation not on anticoagulation, seen in the ED at [facility] with a complaint of new onset right-sided weakness. Deficit was present when he woke up this morning. He was last known well at 10:00 last night. His wife noted difficulty with ambulation with coordination of his right lower extremity as well as coordination of his right upper extremity using his cane. CT scan of his head showed no acute changes. MRI is pending. NIH stroke score at the time of this evaluation was 2. Patient was LKW 10:00 PM on 07/02/2016. tPA was not given as beyond time under for treatment consideration when he arrived in the ED. the patient and her hemorrhagic stroke in 2015 with right thalamic hemorrhage and hence was not a candidate for anticoagulation despite having atrial fibrillation. He was enrolled in [facility] 2 trial for blood pressure control at that time. He underwent mitral valvuloplasty in 2007 and a maze procedure with essentially occluded left atrial appendage. CT scan of the brain on admission showed multiple old lacunar infarcts. MRI scan showed small acute cortical and subcortical left frontoparietal infarcts with a punctate infarct in the right occipital cortex is patent. There are chronic infarcts in both cerebellum and subcortical white matter. Lower extremity venous Dopplers were  negative for DVT. Carotid ultrasound showed no significant extracranial stenosis. LDL cholesterol was 84 mg percent. Hemoglobin A1c was 5.5. Transthoracic echo showed normal ejection fraction with mitral annuloplasty ring in place  without significant stenosis.CHA2DS2-VASc Score = 5. He had a history of patent foramen ovale but lower extremity venous Dopplers current admission were negative. He has finished outpatient therapies and is currently walking with a cane for long distances but can walk without it in dose. He is pretty safe and not had any falls or injuries. His back on his aspirin and tolerating it well. His blood pressure is well controlled and today in fact it is low at 107/69. Is tolerating Pravachol well without muscle aches or pains.   ROS:   14 system review of systems is positive for numbness, weakness, hearing loss, walking difficulty and all other systems negative  PMH:  Past Medical History:  Diagnosis Date  . Atrial fibrillation ([place])    Dr. Tollie Eth  . BPH (benign prostatic hypertrophy) 2012   prior therapy with finasteride, but as of 03/2015 no c/o; Dr. Luanne Bras  . Cellulitis of face 11/08   [facility]  . CVA (cerebral infarction) 2009, 2015  . Echocardiogram abnormal 02/11/2010   LVEF 70%, severe mitral regurgitation, pulm HTN   . ED (erectile dysfunction)    Viagra prn, consult with Urology 2012  . Elevated PSA 2012   BPH; Dr. Luanne Bras, [facility]; no more PSAs needed as of 2012 consult  . Gait abnormality    uses cane, s/p CVA  . Hemiparesis affecting left side as late effect of cerebrovascular accident ([place])   .  Hiatal hernia    per 2008 EUS  . Hyperlipidemia   . Hypertension   . Microscopic hematuria    chronic, eval 2012 with Urology, benign at that time  . Obstructive uropathy 2012   Dr. Luanne Bras  . Pancreatic mass 01/2006   inconsequential per CT 2007  . Parapelvic renal cyst 01/2006   CT demonstrated  inconsequential cyst  . Patent foramen ovale    hx/o, notation of fistula on echocardiogram from 2009  . Prophylactic antibiotic 2016   requires due to valve disease  . Schatzki's ring 2008   per endoscopic ultrasound; Dr. Janetta Hora. Jack [name]  . Stroke ([place])   . Ventral hernia    surgery consult 01/2010 with Dr. Excell Seltzer, elective repair if desired , but he declined  . Wears hearing aid     Social History:  Social History   Social History  . Marital status: Married    Spouse name: N/A  . Number of children: N/A  . Years of education: N/A   Occupational History  . Not on file.   Social History Main Topics  . Smoking status: Never Smoker  . Smokeless tobacco: Never Used  . Alcohol use No  . Drug use: No  . Sexual activity: Not on file   Other Topics Concern  . Not on file   Social History Narrative   Married, exercises with stationary bike 5 days per week.   Eats healthy.  As of 03/2015    Medications:   Current Outpatient Prescriptions on File Prior to Visit  Medication Sig Dispense Refill  . acetaminophen (TYLENOL) 325 MG tablet Take 1-2 tablets (325-650 mg total) by mouth every 4 (four) hours as needed for mild pain.    Marland Kitchen albuterol (PROVENTIL HFA;VENTOLIN HFA) 108 (90 Base) MCG/ACT inhaler Inhale 2 puffs into the lungs every 6 (six) hours as needed for wheezing or shortness of breath. 1 Inhaler 0  . amLODipine (NORVASC) 10 MG tablet Take 1 tablet (10 mg total) by mouth at bedtime. 90 tablet 3  . aspirin EC 325 MG EC tablet Take 1 tablet (325 mg total) by mouth daily. 30 tablet 0  . cholecalciferol (VITAMIN D) 400 units TABS tablet Take 400 Units by mouth daily.    . hydrochlorothiazide (HYDRODIURIL) 25 MG tablet Take 1 tablet (25 mg total) by mouth daily. 30 tablet 5  . MAGNESIUM PO Take 1 tablet by mouth daily.     Marland Kitchen omeprazole (PRILOSEC) 20 MG capsule Take 1 capsule (20 mg total) by mouth daily. 30 capsule 0  . potassium chloride SA  (K-DUR,KLOR-CON) 20 MEQ tablet Take 1 tablet (20 mEq total) by mouth daily. 30 tablet 5  . pravastatin (PRAVACHOL) 40 MG tablet Take 1 tablet (40 mg total) by mouth daily at 6 PM. 90 tablet 1   No current facility-administered medications on file prior to visit.     Allergies:   Allergies  Allergen Reactions  . Valsartan Swelling    angioedema    Physical Exam General:Frail elderly Caucasian male seated, in no evident distress Head: head normocephalic and atraumatic.  Neck: supple with no carotid or supraclavicular bruits Cardiovascular: regular rate and rhythm, no murmurs Musculoskeletal: no deformity Skin:  no rash/petichiae Vascular:  Normal pulses all extremities Vitals:   09/09/16 1447  BP: 107/64  Pulse: 87   Neurologic Exam Mental Status: Awake and fully alert. Oriented to place and time. Recent and remote memory intact. Attention span, concentration and fund of knowledge  appropriate. Mood and affect appropriate.  Cranial Nerves: Fundoscopic exam reveals sharp disc margins. Pupils equal, briskly reactive to light. Extraocular movements full without nystagmus. Visual fields full to confrontation. Hearing intact. Facial sensation intact. Mild left lower facial asymmetry., tongue, palate moves normally and symmetrically.  Motor: Normal bulk and tone. Normal strength in all tested extremity muscles. Mild weakness of left grip and intrinsic hand muscles. Mild weakness of left ankle dorsiflexors. Tone is increased in the left leg with mild spasticity. Sensory.: intact to touch ,pinprick .position and vibratory sensation. Except diminished touch pinprick sensation in left hand. Coordination: Rapid alternating movements normal in all extremities. Finger-to-nose and heel-to-shin performed accurately bilaterally. Gait and Station: Arises from chair without difficulty. Stance is normal. Gait demonstrates normal stride length and balance but slight stiffness and dragging of the left leg. .  Unable to heel, toe and tandem walk without difficulty.  Reflexes: 1+ and symmetric. Toes downgoing.   NIHSS  2 Modified Rankin  3   ASSESSMENT: 71 year caucasian male with cardiac embolic left frontal MCA branch infarct in December 2017 secondary to atrial fibrillation. History of right thalamic intracerebral hemorrhage in January 2015 physical subdural mild left hemiparesis. Patient is reluctant to consider anticoagulation and not a candidate for watchman procedure since he has had a maze procedure    PLAN: I had a long d/w patient and his wife about his recent  Embolic stroke, atrial fibrillation, risk benefit of anticoagulation, watchman devicerisk for recurrent stroke/TIAs, personally independently reviewed imaging studies and stroke evaluation results and answered questions.Continue aspirin 325 mg daily  for secondary stroke prevention  as patient is scared to even go on short-term anticoagulation for watchman device procedure given his prior history of intracerebral hemorrhage in 2015 and maintain strict control of hypertension with blood pressure goal below 130/90, diabetes with hemoglobin A1c goal below 6.5% and lipids with LDL cholesterol goal below 70 mg/dL. I also advised the patient to eat a healthy diet with plenty of whole grains, cereals, fruits and vegetables, exercise regularly and maintain ideal body weight we also talked about fall and safety precautions. Followup in the future with my nurse practitioner in 6 months or call earlier if necessary Greater than 50% of time during this 25 minute visit was spent on counseling,explanation of diagnosis, planning of further management, discussion with patient and family and coordination of care    Antony Contras, MD  [facility] [address] [place], [place] [address]  Phone [phone] Fax [phone] Note: This document was prepared with digital dictation and possible smart phrase technology. Any  transcriptional errors that result from this process are unintentional

## 2016-09-09 NOTE — Therapy (Signed)
[facility] [address] [place], [place], [address] Phone: [phone]   Fax:  [phone]  Physical Therapy Treatment  Patient Details  Name: Jack [name] MRN: [id number] Date of Birth: 12-Dec-1933 Referring Provider: Dr. Alger Simons  Encounter Date: 09/09/2016      PT End of Session - 09/09/16 2103    Visit Number 13   Number of Visits 17   Date for PT Re-Evaluation 09/17/16   Authorization Type [employer] Medicare-GCODE required every 10th visit   PT Start Time 1315   PT Stop Time 1402   PT Time Calculation (min) 47 min   Equipment Utilized During Treatment Gait belt   Activity Tolerance Patient tolerated treatment well   Behavior During Therapy [facility] for tasks assessed/performed      Past Medical History:  Diagnosis Date  . Atrial fibrillation ([place])    Dr. Tollie Eth  . BPH (benign prostatic hypertrophy) 2012   prior therapy with finasteride, but as of 03/2015 no c/o; Dr. Luanne Bras  . Cellulitis of face 11/08   [facility]  . CVA (cerebral infarction) 2009, 2015  . Echocardiogram abnormal 02/11/2010   LVEF 70%, severe mitral regurgitation, pulm HTN   . ED (erectile dysfunction)    Viagra prn, consult with Urology 2012  . Elevated PSA 2012   BPH; Dr. Luanne Bras, [facility]; no more PSAs needed as of 2012 consult  . Gait abnormality    uses cane, s/p CVA  . Hemiparesis affecting left side as late effect of cerebrovascular accident ([place])   . Hiatal hernia    per 2008 EUS  . Hyperlipidemia   . Hypertension   . Microscopic hematuria    chronic, eval 2012 with Urology, benign at that time  . Obstructive uropathy 2012   Dr. Luanne Bras  . Pancreatic mass 01/2006   inconsequential per CT 2007  . 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 Patent foramen ovale    hx/o, notation of fistula on echocardiogram from 2009  . Prophylactic antibiotic 2016   requires due to valve disease  . Schatzki's ring 2008   per endoscopic ultrasound; Dr. Janetta Hora. Jack [name]  . Stroke ([place])   . Ventral hernia    surgery consult 01/2010 with Dr. Excell Seltzer, elective repair if desired , but he declined  . Wears hearing aid     Past Surgical History:  Procedure Laterality Date  . CARDIAC CATHETERIZATION  02/2010   LVEF 70%, severe mitral regurgitation, pulm HTN; prior in 01/2006  . INGUINAL HERNIA REPAIR     bilat  . MITRAL VALVE REPAIR  01/2006   mitral repair with Maze procedure, tricuspid annuloplasty, MAZE and LAA oversewn by Dr. Roxy Manns    There were no vitals filed for this visit.      Subjective Assessment - 09/09/16 1316    Subjective Reports a few stumbles, but no falls. Difficult trying to stay "tall on rt leg." Unable to state any further mobility goals TBA and agrees with discharge   Patient is accompained by: Family member   Pertinent History old Rt CVA with Lt spastic hemiparesis; new Lt CVA with Rt hemiparesis   Patient Stated Goals walking and balance to improve   Currently in Pain? No/denies                         [facility] Adult PT Treatment/Exercise - 09/09/16 0001  Transfers   Transfers Sit to Stand;Stand to Sit   Sit to Stand Without upper extremity assist;From bed;6: Modified independent (Device/Increase time)   Stand to Sit 6: Modified independent (Device/Increase time);Without upper extremity assist     Ambulation/Gait   Ambulation/Gait Assistance 4: Min guard   Ambulation/Gait Assistance Details No episodes of Lt foot catching. VC and facilitation for ft hip extension. Ambulated in tight spaces with multiple changes in direction and sudden stops   Ambulation Distance (Feet) 330 Feet  75, 75   Assistive device Straight cane   Gait Pattern Step-through pattern;Decreased arm swing - left;Decreased step length - right;Decreased step length - left;Decreased hip/knee flexion - left;Decreased  dorsiflexion - left;Decreased weight shift to right;Lateral hip instability   Ambulation Surface Level;Indoor   Gait velocity 1.83 ft/sec     Posture/Postural Control   Posture/Postural Control Postural limitations   Postural Limitations Forward head;Decreased lumbar lordosis;Rounded Shoulders   Posture Comments addressed throughout session with pt able to partially reverse             Balance Exercises - 09/09/16 2057      Balance Exercises: Standing   Stepping Strategy Anterior;Posterior;Lateral;Foam/compliant surface;10 reps  intermittent UE support; 10x each leg, each directoin   Other Standing Exercises push and release test anterior and posterior; pt able to use hip and ankle strategy to regain balance without stepping           PT Education - 09/09/16 2102    Education provided Yes   Education Details results of POC; LTGs met; continue working out at [employer]) Educated Patient;Spouse   Methods Explanation   Comprehension Verbalized understanding          PT Short Term Goals - 08/24/16 1744      PT SHORT TERM GOAL #1   Title Pt will perform HEP with family's supervision for improved strength, balance, and gait.  TARGET 08/18/16   Baseline met 08-24-16 per pt report   Status Achieved     PT SHORT TERM GOAL #2   Title Pt will improve TUG score to less than or equal to 25 seconds for decreased fall risk.   Status Partially Met     PT SHORT TERM GOAL #3   Title Pt will improve Berg Balance score to at least 45/56 for decreased fall risk.   Status Partially Met     PT SHORT TERM GOAL #4   Title Pt will ambulate at least 300 ft using appropriate assistive device (RW versus cane) modified independently, for improved safety with gait.   Status Partially Met     PT SHORT TERM GOAL #5   Title Pt will negotiate at least 7 steps with one handrail, modified independently for improved safety with stair negotiation in the home.   Status Partially Met     PT  SHORT TERM GOAL #6   Title Pt/family will verbalize understanding of CVA education, including warning signs and symptoms.   Status Achieved           PT Long Term Goals - 09/09/16 2104      PT LONG TERM GOAL #1   Title Pt/family will verbalize understanding of fall prevention in the home environment.  TARGET 09/17/16   Time 8   Period Weeks   Status Achieved     PT LONG TERM GOAL #2   Title Pt will improve TUG score to less than or equal to 22 seconds for decreased fall risk.  Baseline 08/31/16: met with 16.56 with cane today   Status Achieved     PT LONG TERM GOAL #3   Title Pt will improve gait velocity to at least 1.8 ft/sec for improved gait efficiency and safety.   Baseline 08/31/16: met today with 1.82 ft/sec with cane   Status Achieved     PT LONG TERM GOAL #4   Title Pt will ambulate at least 50-100 ft, household distances, with cane, with supervision for improved household mobility.   Baseline 2/27 completed with no LOB throughout session; looking for consistency to state "met"; met 3/1   Time 8   Period Weeks   Status Achieved     PT LONG TERM GOAL #5   Title Pt will report at least 20% improvement in STroke IS mobiltiy scale on FOTO to demonstrate improved functional mobility.   Time 8   Period Weeks   Status Achieved               Plan - October 06, 2016 Oct 05, 2110    Clinical Impression Statement Review of patient's status and LTG's revealed pt has met 5 of 5 LTGs. Patient reports pleased with progress and does not feel he needs further PT. Discharge this date   Rehab Potential Good   Consulted and Agree with Plan of Care Patient;Family member/caregiver   Family Member Consulted wife      Patient will benefit from skilled therapeutic intervention in order to improve the following deficits and impairments:  Abnormal gait, Decreased balance, Decreased mobility, Decreased safety awareness, Decreased strength, Difficulty walking, Decreased activity tolerance  Visit  Diagnosis: Other abnormalities of gait and mobility  Muscle weakness (generalized)  Unsteadiness on feet  Hemiplegia and hemiparesis following cerebral infarction affecting right non-dominant side (HCC)       G-Codes - 10-06-2016 05-Oct-2113    Functional Assessment Tool Used (Outpatient Only) Berg 49/56, TUG 16.56 with cane, gait velocity 1.82 ft/sec with cane   Functional Limitation Mobility: Walking and moving around   Mobility: Walking and Moving Around Goal Status [phone]) At least 20 percent but less than 40 percent impaired, limited or restricted   Mobility: Walking and Moving Around Discharge Status [phone]) At least 20 percent but less than 40 percent impaired, limited or restricted           Problem List Patient Active Problem List   Diagnosis Date Noted  . Urinary frequency 07/20/2016  . Subcortical infarction ([place]) 07/09/2016  . PFO (patent foramen ovale)   . History of CVA with residual deficit   . Benign essential HTN   . Non compliance with medical treatment   . Hypokalemia   . Acute blood loss anemia   . Acute CVA (cerebrovascular accident) ([place]) 07/05/2016  . Embolic stroke involving left middle cerebral artery ([place])   . TIA (transient ischemic attack) 07/03/2016  . Right sided weakness 07/03/2016  . Microscopic hematuria 04/03/2015  . Hyperlipidemia 04/03/2015  . Wears hearing aid 04/03/2015  . Changing skin lesion 04/03/2015  . Paresthesia and pain of left extremity 04/02/2015  . Encounter for health maintenance examination in adult 04/02/2015  . Vaccine counseling 04/02/2015  . Hernia of abdominal cavity 04/02/2015  . Impaired gait and mobility 04/02/2015  . Leg swelling 03/03/2015  . Essential hypertension 03/03/2015  . Spastic hemiplegia affecting nondominant side ([place]) 12/14/2013  . ICH (intracerebral hemorrhage) ([place]) 08/02/2013  . Hx of mitral valve repair   . History of stroke   . History of atrial fibrillation   .  Cyst and pseudocyst of pancreas  09/13/2007  . PVC (premature ventricular contraction)   . History of renal cyst   . BPH (benign prostatic hypertrophy)     PHYSICAL THERAPY DISCHARGE SUMMARY  Visits from Start of Care: 13  Current functional level related to goals / functional outcomes: Ambulatory outside home with [facility]; inside no device   Remaining deficits: Spastic left extremities; using quad cane to walk   Education / Equipment: HEP  Plan: Patient agrees to discharge.  Patient goals were met. Patient is being discharged due to meeting the stated rehab goals.  ?????          Rexanne Mano, PT 09/09/2016, 9:21 PM  [facility] [address] [place], [place], [address] Phone: [phone]   Fax:  [phone]  Name: Jack [name] MRN: [id number] Date of Birth: 01-29-1934

## 2016-09-14 ENCOUNTER — Ambulatory Visit: Payer: Medicare Other | Admitting: Physical Therapy

## 2016-09-15 ENCOUNTER — Encounter: Payer: Medicare Other | Attending: Physical Medicine & Rehabilitation | Admitting: Physical Medicine & Rehabilitation

## 2016-09-15 ENCOUNTER — Encounter: Payer: Self-pay | Admitting: Physical Medicine & Rehabilitation

## 2016-09-15 VITALS — BP 124/77 | HR 86 | Resp 14

## 2016-09-15 DIAGNOSIS — E785 Hyperlipidemia, unspecified: Secondary | ICD-10-CM | POA: Insufficient documentation

## 2016-09-15 DIAGNOSIS — I69354 Hemiplegia and hemiparesis following cerebral infarction affecting left non-dominant side: Secondary | ICD-10-CM | POA: Insufficient documentation

## 2016-09-15 DIAGNOSIS — K222 Esophageal obstruction: Secondary | ICD-10-CM | POA: Diagnosis not present

## 2016-09-15 DIAGNOSIS — K449 Diaphragmatic hernia without obstruction or gangrene: Secondary | ICD-10-CM | POA: Diagnosis not present

## 2016-09-15 DIAGNOSIS — I63412 Cerebral infarction due to embolism of left middle cerebral artery: Secondary | ICD-10-CM

## 2016-09-15 DIAGNOSIS — Z5189 Encounter for other specified aftercare: Secondary | ICD-10-CM | POA: Insufficient documentation

## 2016-09-15 DIAGNOSIS — I10 Essential (primary) hypertension: Secondary | ICD-10-CM | POA: Diagnosis not present

## 2016-09-15 DIAGNOSIS — B9689 Other specified bacterial agents as the cause of diseases classified elsewhere: Secondary | ICD-10-CM | POA: Insufficient documentation

## 2016-09-15 DIAGNOSIS — I4891 Unspecified atrial fibrillation: Secondary | ICD-10-CM | POA: Insufficient documentation

## 2016-09-15 DIAGNOSIS — N390 Urinary tract infection, site not specified: Secondary | ICD-10-CM | POA: Diagnosis not present

## 2016-09-15 DIAGNOSIS — N400 Enlarged prostate without lower urinary tract symptoms: Secondary | ICD-10-CM | POA: Diagnosis not present

## 2016-09-15 DIAGNOSIS — R3129 Other microscopic hematuria: Secondary | ICD-10-CM | POA: Insufficient documentation

## 2016-09-15 DIAGNOSIS — R350 Frequency of micturition: Secondary | ICD-10-CM | POA: Diagnosis not present

## 2016-09-15 NOTE — Progress Notes (Signed)
Subjective:    Patient ID: Jack [name], male    DOB: [birth year], 83 y.o.   MRN: [id number]  HPI Jack [name] is here in follow up of his CVA. He has been making gradual progress with his gait. He has graduated from PT and is going to the gym to walk on the treadmill and to use the bike. He is going almost every day. He was in the ED several weeks ago for bronchospasms. He has an occasional wheeze but is not short of breath currently. He does report persistent swelling in his ankles despite wearing his TEDS.   His pain is minimal and is generally in his hands related to the stroke. He uses a cane for balance. He has not had any falls  Pain Inventory Average Pain 2 Pain Right Now 2 My pain is tingling  In the last 24 hours, has pain interfered with the following? General activity 2 Relation with others 0 Enjoyment of life 0 What TIME of day is your pain at its worst? n/a Sleep (in general) Fair  Pain is worse with: unsure Pain improves with: rest Relief from Meds: 0  Mobility use a cane how many minutes can you walk? 10 ability to climb steps?  yes do you drive?  no  Function retired Do you have any goals in this area?  no  Neuro/Psych weakness numbness tingling trouble walking  Prior Studies Any changes since last visit?  no  Physicians involved in your care Any changes since last visit?  no   Family History  Problem Relation Age of Onset  . Family history unknown: Yes   Social History   Social History  . Marital status: Married    Spouse name: N/A  . Number of children: N/A  . Years of education: N/A   Social History Main Topics  . Smoking status: Never Smoker  . Smokeless tobacco: Never Used  . Alcohol use No  . Drug use: No  . Sexual activity: Not Asked   Other Topics Concern  . None   Social History Narrative   Married, exercises with stationary bike 5 days per week.   Eats healthy.  As of 03/2015   Past Surgical History:  Procedure  Laterality Date  . CARDIAC CATHETERIZATION  02/2010   LVEF 70%, severe mitral regurgitation, pulm HTN; prior in 01/2006  . INGUINAL HERNIA REPAIR     bilat  . MITRAL VALVE REPAIR  01/2006   mitral repair with Maze procedure, tricuspid annuloplasty, MAZE and LAA oversewn by Jack [name]   Past Medical History:  Diagnosis Date  . Atrial fibrillation ([place])    Jack [name]  . BPH (benign prostatic hypertrophy) 2012   prior therapy with finasteride, but as of 03/2015 no c/o; Jack [name]  . Cellulitis of face 11/08   [facility] ENT  . CVA (cerebral infarction) 2009, 2015  . Echocardiogram abnormal 02/11/2010   LVEF 70%, severe mitral regurgitation, pulm HTN   . ED (erectile dysfunction)    Viagra prn, consult with Urology 2012  . Elevated PSA 2012   BPH; Jack [name], [facility]; no more PSAs needed as of 2012 consult  . Gait abnormality    uses cane, s/p CVA  . Hemiparesis affecting left side as late effect of cerebrovascular accident ([place])   . Hiatal hernia    per 2008 EUS  . Hyperlipidemia   . Hypertension   . Microscopic hematuria    chronic, eval 2012 with  Urology, benign at that time  . Obstructive uropathy 2012   Jack [name]  . Pancreatic mass 01/2006   inconsequential per CT 2007  . 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 Patent foramen ovale    hx/o, notation of fistula on echocardiogram from 2009  . Prophylactic antibiotic 2016   requires due to valve disease  . Schatzki's ring 2008   per endoscopic ultrasound; Jack [name]. Jack [name]  . Stroke ([place])   . Ventral hernia    surgery consult 01/2010 with Jack [name], elective repair if desired , but he declined  . Wears hearing aid    BP 124/77   Pulse 86   Resp 14   SpO2 97%   Opioid Risk Score:   Fall Risk Score:  `1  Depression screen PHQ 2/9  Depression screen [facility] 2/9 09/15/2016 07/27/2016 07/20/2016 02/24/2015  Decreased  Interest 0 0 0 0  Down, Depressed, Hopeless 0 0 0 0  PHQ - 2 Score 0 0 0 0  Altered sleeping - - 0 -  Tired, decreased energy - - 2 -  Change in appetite - - 2 -  Feeling bad or failure about yourself  - - 0 -  Trouble concentrating - - 0 -  Moving slowly or fidgety/restless - - 0 -  Suicidal thoughts - - 0 -  PHQ-9 Score - - 4 -    Review of Systems  HENT: Negative.   Eyes: Negative.   Respiratory: Positive for cough and wheezing.   Cardiovascular: Positive for leg swelling.  Gastrointestinal: Negative.   Endocrine: Negative.   Genitourinary: Negative.   Musculoskeletal: Positive for gait problem.  Skin: Negative.   Allergic/Immunologic: Negative.   Neurological: Positive for weakness and numbness.       Tingling  Hematological: Negative.   Psychiatric/Behavioral: Negative.   All other systems reviewed and are negative.      Objective:   Physical Exam Constitutional: He appears well-developed.  HENT:  Head: Normocephalicand atraumatic.  Eyes: EOMare normal.  Neck: Normal range of motion. Neck supple. No JVDpresent. No tracheal deviationpresent. No thyromegalypresent.  Cardiovascular: RRR Respiratory: cta bilaterally except for an occasional exp wheeze  GI: Soft. Bowel sounds are normal. He exhibits no distension. Bruising over LLQ  Musculoskeletal: He exhibits no edema.  Skin: Skin is warmand dry.  Musculoskeletal: He exhibits no current LE edemaor tenderness.  Neurological: He is alertand oriented to person, place, and time. CN notable for mild left central 7.  HOH but functional with hearing aid. Some decreased LT LUE Motor: RUE: 4+/5 deltoid, bicep, tricep, wrist, HI. RLE: 4+HF, 4+/5 KE and ADF/PF  LUE: 4/5 deltoid, bicep, tricep, wrist, HI. LLE: 4 HF,KE and ADF/PF --stable DTRs 3+ LUE/LLE Walks with better weight shift to the left. Still will hold left hand to hip but can swing left arm when cued. Marland Kitchen He is fairly steady ambulating with his cane. Hip  sway reduced.  Skin: Skin is warmand dry.  Psychiatric: His mood is pleasant, a little flat       Assessment & Plan:  1. New right hemiplegia gait disordersecondary to acute cortical/subcortical infarct left high frontoparietal region as well as history of hemorrhagic CVA in the past with chronic spastic left hemiparesis -continue with HEP, gym trips.           - discussed gait techniques, safety, etc.  2.  Pain Management: Tylenol as needed--primarily dyesthesias in hands from stroke. 4.  Hypertension/atrial fibrillation. Norvasc 10 mg daily. Cardiac rate controlled   . Patient has refused Eliquisand remains on aspirin only. Does have some lower ext edema but chest exam is clear -continue HCTZ with amlodipine           -recommended that he continue kdur and magnesium supps for now  -follow up with cardiology as directed. Advised him to call sooner if swelling increases.     Fifteen minutes of face to face patient care time were spent during this visit. All questions were encouraged and answered.  Can see me back on a prn basis in the future.         Assessment & Plan:

## 2016-09-15 NOTE — Patient Instructions (Signed)
Work on [employer] and slight weight reduction.  Follow up with Dr. Wynonia Lawman as scheduled   PLEASE FEEL FREE TO CALL OUR OFFICE WITH ANY PROBLEMS OR QUESTIONS ([phone])

## 2016-09-16 ENCOUNTER — Ambulatory Visit: Payer: Medicare Other | Admitting: Physical Therapy

## 2016-09-20 ENCOUNTER — Other Ambulatory Visit: Payer: Self-pay | Admitting: Nurse Practitioner

## 2016-09-20 DIAGNOSIS — R05 Cough: Secondary | ICD-10-CM

## 2016-09-20 DIAGNOSIS — R059 Cough, unspecified: Secondary | ICD-10-CM

## 2016-09-20 NOTE — Telephone Encounter (Signed)
Routing to [place], please advise, thanks 

## 2016-09-23 ENCOUNTER — Telehealth: Payer: Self-pay | Admitting: Nurse Practitioner

## 2016-09-23 DIAGNOSIS — I10 Essential (primary) hypertension: Secondary | ICD-10-CM

## 2016-09-23 DIAGNOSIS — M7989 Other specified soft tissue disorders: Secondary | ICD-10-CM

## 2016-09-23 MED ORDER — HYDROCHLOROTHIAZIDE 25 MG PO TABS: 25.0000 mg | ORAL_TABLET | Freq: Every day | ORAL | 1 refills | Status: DC

## 2016-09-23 NOTE — Telephone Encounter (Signed)
Pt wife called in and said this meds would be called into [facility]

## 2016-09-23 NOTE — Telephone Encounter (Signed)
Pt's spouse aware rx sent to [facility], update pharmacy in chart.   Spoke with Edwyn from [employer], aware to discontinue Hydrochlorothiazide 25mg .

## 2016-09-23 NOTE — Telephone Encounter (Signed)
Let vm for pt to call back, received request from [facility] requesting refill for hydrochlorothiazide. Need to make sure this is where it needs to go to because we sent this med to [facility] with 5 refills.

## 2016-10-25 ENCOUNTER — Other Ambulatory Visit: Payer: Self-pay | Admitting: Nurse Practitioner

## 2016-10-25 DIAGNOSIS — R05 Cough: Secondary | ICD-10-CM

## 2016-10-25 DIAGNOSIS — R059 Cough, unspecified: Secondary | ICD-10-CM

## 2016-12-07 ENCOUNTER — Encounter: Payer: Self-pay | Admitting: Nurse Practitioner

## 2016-12-07 ENCOUNTER — Ambulatory Visit (INDEPENDENT_AMBULATORY_CARE_PROVIDER_SITE_OTHER): Payer: Medicare Other | Admitting: Nurse Practitioner

## 2016-12-07 VITALS — BP 122/70 | HR 97 | Temp 98.5°F | Resp 14 | Ht 62.0 in | Wt 147.0 lb

## 2016-12-07 DIAGNOSIS — K5900 Constipation, unspecified: Secondary | ICD-10-CM | POA: Diagnosis not present

## 2016-12-07 DIAGNOSIS — I10 Essential (primary) hypertension: Secondary | ICD-10-CM

## 2016-12-07 DIAGNOSIS — E785 Hyperlipidemia, unspecified: Secondary | ICD-10-CM

## 2016-12-07 DIAGNOSIS — R059 Cough, unspecified: Secondary | ICD-10-CM

## 2016-12-07 DIAGNOSIS — M7989 Other specified soft tissue disorders: Secondary | ICD-10-CM

## 2016-12-07 DIAGNOSIS — R05 Cough: Secondary | ICD-10-CM | POA: Diagnosis not present

## 2016-12-07 MED ORDER — AMLODIPINE BESYLATE 5 MG PO TABS: 5.0000 mg | ORAL_TABLET | Freq: Every day | ORAL | 1 refills | Status: DC

## 2016-12-07 MED ORDER — PRAVASTATIN SODIUM 40 MG PO TABS: 40.0000 mg | ORAL_TABLET | Freq: Every day | ORAL | 1 refills | Status: DC

## 2016-12-07 MED ORDER — DOCUSATE SODIUM 100 MG PO CAPS: 100.0000 mg | ORAL_CAPSULE | Freq: Two times a day (BID) | ORAL | 1 refills | Status: DC

## 2016-12-07 MED ORDER — ASPIRIN 325 MG PO TBEC: 325.0000 mg | DELAYED_RELEASE_TABLET | Freq: Every day | ORAL | 1 refills | Status: DC

## 2016-12-07 MED ORDER — OMEPRAZOLE 20 MG PO CPDR: 20.0000 mg | DELAYED_RELEASE_CAPSULE | Freq: Every day | ORAL | 1 refills | Status: DC

## 2016-12-07 MED ORDER — AMLODIPINE BESYLATE 5 MG PO TABS: 5.0000 mg | ORAL_TABLET | Freq: Every day | ORAL | Status: DC

## 2016-12-07 NOTE — Patient Instructions (Signed)
Please review medication list with medications at home.  Please let me know if you are still taking potassium chloride and hydrochlorothiazide.  If you have medicare related insurance (such as traditional Medicare, [employer], [employer], or similar), Please make an appointment at the scheduling desk with Sharee Pimple, the [employer], for your Wellness visit in this office, which is a benefit with your insurance.

## 2016-12-07 NOTE — Progress Notes (Signed)
Subjective:  Patient ID: Jack [name], male    DOB: September 16, 1933  Age: 83 y.o. MRN: [id number]  CC: Follow-up (HTN)   Constipation  This is a new problem. The current episode started 1 to 4 weeks ago. The problem has been waxing and waning since onset. The stool is described as firm and pellet like. The patient is not on a high fiber diet. He does not exercise regularly. There has been adequate water intake. Associated symptoms include bloating. Pertinent negatives include no abdominal pain, anorexia, back pain, diarrhea, difficulty urinating, fecal incontinence, fever, flatus, hematochezia, hemorrhoids, melena, nausea, rectal pain, vomiting or weight loss. Risk factors include dietary change and change in medication usage/dosage. He has tried nothing for the symptoms. There is no history of abdominal surgery, endocrine disease, irritable bowel syndrome, metabolic disease, neuromuscular disease or psychiatric history.    HTN: Stable with use of amlodipine and valsartan. Valsartan was resumed by cardiology. Amlodipine was decreased to 5mg . He is unsure if he is taking HCTZ and Kdur or not. He denies any adverse effects with use of valsartan.  Outpatient Medications Prior to Visit  Medication Sig Dispense Refill  . acetaminophen (TYLENOL) 325 MG tablet Take 1-2 tablets (325-650 mg total) by mouth every 4 (four) hours as needed for mild pain.    Marland Kitchen albuterol (PROVENTIL HFA;VENTOLIN HFA) 108 (90 Base) MCG/ACT inhaler Inhale 2 puffs into the lungs every 6 (six) hours as needed for wheezing or shortness of breath. 1 Inhaler 0  . cholecalciferol (VITAMIN D) 400 units TABS tablet Take 400 Units by mouth daily.    . hydrochlorothiazide (HYDRODIURIL) 25 MG tablet Take 1 tablet (25 mg total) by mouth daily. 90 tablet 1  . MAGNESIUM PO Take 1 tablet by mouth daily.     . potassium chloride SA (K-DUR,KLOR-CON) 20 MEQ tablet Take 1 tablet (20 mEq total) by mouth daily. 30 tablet 5  . amLODipine (NORVASC) 10  MG tablet Take 1 tablet (10 mg total) by mouth at bedtime. (Patient taking differently: Take 5 mg by mouth at bedtime. ) 90 tablet 3  . aspirin EC 325 MG EC tablet Take 1 tablet (325 mg total) by mouth daily. 30 tablet 0  . omeprazole (PRILOSEC) 20 MG capsule TAKE 1 CAPSULE(20 MG) BY MOUTH DAILY 90 capsule 0  . pravastatin (PRAVACHOL) 40 MG tablet Take 1 tablet (40 mg total) by mouth daily at 6 PM. 90 tablet 1   No facility-administered medications prior to visit.     ROS See HPI  Objective:  BP 122/70 (BP Location: Left Arm, Patient Position: Sitting, Cuff Size: Normal)   Pulse 97   Temp 98.5 F (36.9 C) (Oral)   Resp 14   Ht 5\' 2"  (1.575 m)   Wt 147 lb (66.7 kg)   SpO2 99%   BMI 26.89 kg/m   BP Readings from Last 3 Encounters:  12/07/16 122/70  09/15/16 124/77  09/09/16 107/64    Wt Readings from Last 3 Encounters:  12/07/16 147 lb (66.7 kg)  09/09/16 150 lb (68 kg)  09/07/16 152 lb (68.9 kg)    Physical Exam  Constitutional: He is oriented to person, place, and time.  Neck: Normal range of motion. Neck supple.  Cardiovascular: Normal rate and regular rhythm.   Murmur heard. Pulmonary/Chest: Effort normal and breath sounds normal.  Abdominal: He exhibits no distension. There is no tenderness.  Ventral hernia (not new per patient)  Musculoskeletal: He exhibits no edema or tenderness.  Neurological: He  is oriented to person, place, and time.  Ambulates with cane  Skin: Skin is warm and dry.  Vitals reviewed.   BMP Latest Ref Rng & Units 09/07/2016 07/27/2016 07/16/2016  Glucose 70 - 99 mg/dL 91 106(H) -  BUN 6 - 23 mg/dL 17 15 -  Creatinine 0.40 - 1.50 mg/dL 0.89 0.75 1.02  Sodium 135 - 145 mEq/L 138 133(L) -  Potassium 3.5 - 5.1 mEq/L 3.2(L) 3.2(L) -  Chloride 96 - 112 mEq/L 98 92(L) -  CO2 19 - 32 mEq/L 32 30 -  Calcium 8.4 - 10.5 mg/dL 9.7 9.0 -   CBC Latest Ref Rng & Units 07/27/2016 07/13/2016 07/09/2016  WBC 4.0 - 10.5 K/uL 7.2 7.6 7.5  Hemoglobin 13.0 -  17.0 g/dL 13.3 13.7 12.6(L)  Hematocrit 39.0 - 52.0 % 39.7 42.2 37.3(L)  Platelets 150 - 400 K/uL 356 329 300    Dg Chest 2 View  Result Date: 07/27/2016 CLINICAL DATA:  SOB and productive cough x 3 days with audible wheezing. - yellow No pain EXAM: CHEST  2 VIEW COMPARISON:  Chest x-ray dated 07/27/2016. FINDINGS: Heart size and mediastinal contours are stable. Median sternotomy wires appear intact and stable in alignment. Valvular hardware appears stable in position. Lungs are clear. No pleural effusion or pneumothorax seen. No acute or suspicious osseous finding. IMPRESSION: No active cardiopulmonary disease. No evidence of pneumonia or pulmonary edema. Electronically Signed   By: Franki Cabot M.D.   On: 07/27/2016 21:02   Dg Chest 2 View  Result Date: 07/27/2016 CLINICAL DATA:  Wheezing and dyspnea EXAM: CHEST  2 VIEW COMPARISON:  03/21/2006 CXR FINDINGS: Stable cardiomegaly with aortic atherosclerosis. The patient is status median sternotomy with cardiac valvular replacement. Atelectasis at each lung base left greater than right. No confluent airspace disease or overt pulmonary edema. Probable small hiatal hernia. No acute osseous abnormality. IMPRESSION: No active cardiopulmonary disease.  Bibasilar atelectasis. Electronically Signed   By: Ashley Royalty M.D.   On: 07/27/2016 18:03   Ct Angio Chest Pe W And/or Wo Contrast  Result Date: 07/28/2016 CLINICAL DATA:  Cough and shortness of breath.  Elevated D-dimer. EXAM: CT ANGIOGRAPHY CHEST WITH CONTRAST TECHNIQUE: Multidetector CT imaging of the chest was performed using the standard protocol during bolus administration of intravenous contrast. Multiplanar CT image reconstructions and MIPs were obtained to evaluate the vascular anatomy. CONTRAST:  100 cc Isovue 370 COMPARISON:  Radiographs earlier this day. FINDINGS: Cardiovascular: There are no filling defects within the pulmonary arteries to suggest pulmonary embolus. Atherosclerosis and  tortuosity of the thoracic aorta without evidence of dissection. Post mitral and tricuspid valve replacements. Multi chamber cardiomegaly. There are coronary artery calcifications. Mediastinum/Nodes: No enlarged mediastinal or hilar lymph nodes. Patulous esophagus with small hiatal hernia. Trachea is patent. Questionable linear debris/ secretions within the left lower lobe bronchus, partially obscured by motion. Lungs/Pleura: Narrowing of the left lower lobe bronchus due to compression from the pulmonary artery and adjacent aorta. Scattered atelectasis or scarring in the left lower lobe. No confluent airspace disease. No evidence of pulmonary edema. No pleural fluid. Upper Abdomen: No acute abnormality. Musculoskeletal: Post median sternotomy. There are no acute or suspicious osseous abnormalities. Pseudoarthrosis between lateral right first and second rib, may be sequela of remote injury. Review of the MIP images confirms the above findings. IMPRESSION: 1. No pulmonary embolus. 2. Questionable linear debris/ secretions in the left lower lobe bronchus, partially obscured by motion artifact. 3. Atherosclerosis including coronary artery calcifications. Prosthetic mitral and tricuspid  valves with mild multi chamber cardiomegaly. Electronically Signed   By: Jeb Levering M.D.   On: 07/28/2016 00:29    Assessment & Plan:   Jeanluc was seen today for follow-up.  Diagnoses and all orders for this visit:  Constipation, unspecified constipation type -     docusate sodium (COLACE) 100 MG capsule; Take 1 capsule (100 mg total) by mouth 2 (two) times daily.  Benign essential HTN -     Discontinue: amLODipine (NORVASC) 5 MG tablet; Take 1 tablet (5 mg total) by mouth at bedtime. -     amLODipine (NORVASC) 5 MG tablet; Take 1 tablet (5 mg total) by mouth at bedtime.  Hyperlipidemia, unspecified hyperlipidemia type -     pravastatin (PRAVACHOL) 40 MG tablet; Take 1 tablet (40 mg total) by mouth daily at 6  PM.  Leg swelling  Cough -     omeprazole (PRILOSEC) 20 MG capsule; Take 1 capsule (20 mg total) by mouth daily.  Other orders -     aspirin 325 MG EC tablet; Take 1 tablet (325 mg total) by mouth daily.   I have changed Mr. Tugwell aspirin and omeprazole. I am also having him start on docusate sodium. Additionally, I am having him maintain his acetaminophen, MAGNESIUM PO, cholecalciferol, albuterol, potassium chloride SA, hydrochlorothiazide, valsartan, amLODipine, and pravastatin.  Meds ordered this encounter  Medications  . valsartan (DIOVAN) 160 MG tablet    Sig: Take 160 mg by mouth daily.  Marland Kitchen DISCONTD: amLODipine (NORVASC) 5 MG tablet    Sig: Take 1 tablet (5 mg total) by mouth at bedtime.    Order Specific Question:   Supervising Provider    Answer:   Cassandria Anger [[id number]]  . docusate sodium (COLACE) 100 MG capsule    Sig: Take 1 capsule (100 mg total) by mouth 2 (two) times daily.    Dispense:  120 capsule    Refill:  1    Order Specific Question:   Supervising Provider    Answer:   Cassandria Anger [[id number]]  . amLODipine (NORVASC) 5 MG tablet    Sig: Take 1 tablet (5 mg total) by mouth at bedtime.    Dispense:  90 tablet    Refill:  1    Order Specific Question:   Supervising Provider    Answer:   Cassandria Anger [[id number]]  . aspirin 325 MG EC tablet    Sig: Take 1 tablet (325 mg total) by mouth daily.    Dispense:  90 tablet    Refill:  1    Order Specific Question:   Supervising Provider    Answer:   Cassandria Anger [[id number]]  . omeprazole (PRILOSEC) 20 MG capsule    Sig: Take 1 capsule (20 mg total) by mouth daily.    Dispense:  90 capsule    Refill:  1    Dispense as insurance will pay for.    Order Specific Question:   Supervising Provider    Answer:   Cassandria Anger [[id number]]  . pravastatin (PRAVACHOL) 40 MG tablet    Sig: Take 1 tablet (40 mg total) by mouth daily at 6 PM.    Dispense:  90 tablet    Refill:  1    Order Specific  Question:   Supervising Provider    Answer:   Cassandria Anger [[id number]]    Follow-up: Return in about 3 months (around 03/09/2017) for HTN, hyperlipidemia (fasting).  Wilfred Lacy, NP

## 2016-12-08 ENCOUNTER — Telehealth: Payer: Self-pay | Admitting: Nurse Practitioner

## 2016-12-08 NOTE — Telephone Encounter (Signed)
Pt wife called in and said that pt no longer takes hydrochlorothiazide  FYI

## 2016-12-09 NOTE — Telephone Encounter (Signed)
Ok, thank you Medications removed from medication list

## 2016-12-24 ENCOUNTER — Ambulatory Visit (INDEPENDENT_AMBULATORY_CARE_PROVIDER_SITE_OTHER): Payer: Medicare Other | Admitting: *Deleted

## 2016-12-24 VITALS — BP 143/82 | HR 88 | Resp 20 | Ht 62.0 in | Wt 146.0 lb

## 2016-12-24 DIAGNOSIS — Z0000 Encounter for general adult medical examination without abnormal findings: Secondary | ICD-10-CM

## 2016-12-24 NOTE — Patient Instructions (Signed)
Start doing brain stimulating activities (puzzles, reading, adult coloring books, staying active) to keep memory sharp.   Continue to eat heart healthy diet (full of fruits, vegetables, whole grains, lean protein, water--limit salt, fat, and sugar intake) and increase physical activity as tolerated.   Jack [name] , Thank you for taking time to come for your Medicare Wellness Visit. I appreciate your ongoing commitment to your health goals. Please review the following plan we discussed and let me know if I can assist you in the future.   These are the goals we discussed: Goals    . I want to gain strength to be able to not have to use my cane          Continue to exercise and do the physical therapy exercises       This is a list of the screening recommended for you and due dates:  Health Maintenance  Topic Date Due  . Flu Shot  02/09/2017  . Tetanus Vaccine  04/06/2017  . Pneumonia vaccines  Completed

## 2016-12-24 NOTE — Progress Notes (Signed)
Pre visit review using our clinic review tool, if applicable. No additional management support is needed unless otherwise documented below in the visit note. 

## 2016-12-24 NOTE — Progress Notes (Signed)
Subjective:   Jack [name] is a 83 y.o. male who presents for an Initial Medicare Annual Wellness Visit.  Review of Systems  No ROS.  Medicare Wellness Visit. Additional risk factors are reflected in the social history.    Sleep patterns: no sleep issues, does not get up to void, gets up 2-3 times nightly to void and sleeps 7-8 hours nightly.   Home Safety/Smoke Alarms: Feels safe in home. Smoke alarms in place.  Living environment; residence and Firearm Safety: equipment: Kasandra Knudsen, Type: [place] and [employer], Type: [employer], quantity, no firearms. Seat Belt Safety/Bike Helmet: Wears seat belt.   Counseling:   Eye Exam- appointment yearl  Dental- appointment every 4 months   Male:   CCS- N/A    PSA- No results found for: PSA     Objective:    There were no vitals filed for this visit. There is no height or weight on file to calculate BMI.  Current Medications (verified) Outpatient Encounter Prescriptions as of 12/24/2016  Medication Sig  . acetaminophen (TYLENOL) 325 MG tablet Take 1-2 tablets (325-650 mg total) by mouth every 4 (four) hours as needed for mild pain.  Marland Kitchen albuterol (PROVENTIL HFA;VENTOLIN HFA) 108 (90 Base) MCG/ACT inhaler Inhale 2 puffs into the lungs every 6 (six) hours as needed for wheezing or shortness of breath.  Marland Kitchen amLODipine (NORVASC) 5 MG tablet Take 1 tablet (5 mg total) by mouth at bedtime.  Marland Kitchen aspirin 325 MG EC tablet Take 1 tablet (325 mg total) by mouth daily.  . cholecalciferol (VITAMIN D) 400 units TABS tablet Take 400 Units by mouth daily.  Marland Kitchen docusate sodium (COLACE) 100 MG capsule Take 1 capsule (100 mg total) by mouth 2 (two) times daily.  Marland Kitchen MAGNESIUM PO Take 1 tablet by mouth daily.   Marland Kitchen omeprazole (PRILOSEC) 20 MG capsule Take 1 capsule (20 mg total) by mouth daily.  . pravastatin (PRAVACHOL) 40 MG tablet Take 1 tablet (40 mg total) by mouth daily at 6 PM.  . valsartan (DIOVAN) 160 MG tablet Take 160 mg by mouth daily.   No  facility-administered encounter medications on file as of 12/24/2016.     Allergies (verified) Valsartan   History: Past Medical History:  Diagnosis Date  . Atrial fibrillation ([place])    Dr. Tollie Eth  . BPH (benign prostatic hypertrophy) 2012   prior therapy with finasteride, but as of 03/2015 no c/o; Dr. Luanne Bras  . Cellulitis of face 11/08   [facility]  . CVA (cerebral infarction) 2009, 2015  . Echocardiogram abnormal 02/11/2010   LVEF 70%, severe mitral regurgitation, pulm HTN   . ED (erectile dysfunction)    Viagra prn, consult with Urology 2012  . Elevated PSA 2012   BPH; Dr. Luanne Bras, [facility]; no more PSAs needed as of 2012 consult  . Gait abnormality    uses cane, s/p CVA  . Hemiparesis affecting left side as late effect of cerebrovascular accident ([place])   . Hiatal hernia    per 2008 EUS  . Hyperlipidemia   . Hypertension   . Microscopic hematuria    chronic, eval 2012 with Urology, benign at that time  . Obstructive uropathy 2012   Dr. Luanne Bras  . Pancreatic mass 01/2006   inconsequential per CT 2007  . 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 Patent foramen ovale    hx/o, notation of fistula on echocardiogram from 2009  . Prophylactic antibiotic 2016  requires due to valve disease  . Schatzki's ring 2008   per endoscopic ultrasound; Dr. Janetta Hora. [name]  . Stroke ([place])   . Ventral hernia    surgery consult 01/2010 with Dr. Excell Seltzer, elective repair if desired , but he declined  . Wears hearing aid    Past Surgical History:  Procedure Laterality Date  . CARDIAC CATHETERIZATION  02/2010   LVEF 70%, severe mitral regurgitation, pulm HTN; prior in 01/2006  . INGUINAL HERNIA REPAIR     bilat  . MITRAL VALVE REPAIR  01/2006   mitral repair with Maze procedure, tricuspid annuloplasty, MAZE and LAA oversewn by Dr. Roxy Manns   Family History  Problem Relation Age of Onset    . Family history unknown: Yes   Social History   Occupational History  . Not on file.   Social History Main Topics  . Smoking status: Never Smoker  . Smokeless tobacco: Never Used  . Alcohol use No  . Drug use: No  . Sexual activity: Not on file   Tobacco Counseling Counseling given: Not Answered   Activities of Daily Living In your present state of health, do you have any difficulty performing the following activities: 07/04/2016  Hearing? N  Vision? N  Difficulty concentrating or making decisions? N  Walking or climbing stairs? N  Dressing or bathing? N  Doing errands, shopping? N  Some recent data might be hidden    Immunizations and Health Maintenance Immunization History  Administered Date(s) Administered  . Influenza,inj,Quad PF,36+ Mos 07/10/2016  . Influenza-Unspecified 05/12/2013  . Pneumococcal Conjugate-13 04/07/2007  . Pneumococcal Polysaccharide-23 08/04/2013  . Tdap 04/07/2007   There are no preventive care reminders to display for this patient.  Patient Care Team: Nche, Charlene Brooke, NP as PCP - General (Internal Medicine)  Indicate any recent Medical Services you may have received from other than [facility] providers in the past year (date may be approximate).    Assessment:   This is a routine wellness examination for Jack [name].Physical assessment deferred to PCP.   Hearing/Vision screen No exam data present  Dietary issues and exercise activities discussed:   Diet (meal preparation, eat out, water intake, caffeinated beverages, dairy products, fruits and vegetables): in general, a "healthy" diet  , well balanced, low fat/ cholesterol, low salt eats a variety of fruits and vegetables daily, limits salt, fat/cholesterol, sugar, caffeine, drinks 6-8 glasses of water daily.   Goals    None     Depression Screen PHQ 2/9 Scores 09/15/2016 07/27/2016 07/20/2016 02/24/2015  PHQ - 2 Score 0 0 0 0  PHQ- 9 Score - - 4 -    Fall Risk Fall Risk  09/15/2016  07/27/2016 07/20/2016 02/24/2015 11/02/2013  Falls in the past year? No No No No Yes  Number falls in past yr: - - - - 2 or more  Risk Factor Category  - - - - High Fall Risk  Risk for fall due to : - - - - History of fall(s);Impaired balance/gait;Impaired mobility;Impaired vision;Medication side effect    Cognitive Function:        Screening Tests Health Maintenance  Topic Date Due  . INFLUENZA VACCINE  02/09/2017  . TETANUS/TDAP  04/06/2017  . PNA vac Low Risk Adult  Completed        Plan:     Start doing brain stimulating activities (puzzles, reading, adult coloring books, staying active) to keep memory sharp.   Continue to eat heart healthy diet (full of fruits, vegetables,  whole grains, lean protein, water--limit salt, fat, and sugar intake) and increase physical activity as tolerated.  I have personally reviewed and noted the following in the patient's chart:   . Medical and social history . Use of alcohol, tobacco or illicit drugs  . Current medications and supplements . Functional ability and status . Nutritional status . Physical activity . Advanced directives . List of other physicians . Vitals . Screenings to include cognitive, depression, and falls . Referrals and appointments  In addition, I have reviewed and discussed with patient certain preventive protocols, quality metrics, and best practice recommendations. A written personalized care plan for preventive services as well as general preventive health recommendations were provided to patient.     Michiel Cowboy, RN   12/24/2016

## 2016-12-28 NOTE — Progress Notes (Signed)
Medical screening examination/treatment/procedure(s) were performed by he Welness Coach, RN. As primary care provider I was immediately available for consulation/collaboration. I agree with above documentation. Geo Slone, AGNP-C 

## 2017-03-10 ENCOUNTER — Other Ambulatory Visit (INDEPENDENT_AMBULATORY_CARE_PROVIDER_SITE_OTHER): Payer: Medicare Other

## 2017-03-10 ENCOUNTER — Encounter: Payer: Self-pay | Admitting: Nurse Practitioner

## 2017-03-10 ENCOUNTER — Ambulatory Visit (INDEPENDENT_AMBULATORY_CARE_PROVIDER_SITE_OTHER): Payer: Medicare Other | Admitting: Nurse Practitioner

## 2017-03-10 VITALS — BP 132/78 | HR 85 | Temp 98.0°F | Ht 62.0 in | Wt 143.0 lb

## 2017-03-10 DIAGNOSIS — E782 Mixed hyperlipidemia: Secondary | ICD-10-CM

## 2017-03-10 DIAGNOSIS — R202 Paresthesia of skin: Secondary | ICD-10-CM

## 2017-03-10 DIAGNOSIS — N401 Enlarged prostate with lower urinary tract symptoms: Secondary | ICD-10-CM

## 2017-03-10 DIAGNOSIS — R739 Hyperglycemia, unspecified: Secondary | ICD-10-CM | POA: Insufficient documentation

## 2017-03-10 DIAGNOSIS — M79609 Pain in unspecified limb: Secondary | ICD-10-CM

## 2017-03-10 DIAGNOSIS — R972 Elevated prostate specific antigen [PSA]: Secondary | ICD-10-CM | POA: Insufficient documentation

## 2017-03-10 DIAGNOSIS — I10 Essential (primary) hypertension: Secondary | ICD-10-CM

## 2017-03-10 DIAGNOSIS — R350 Frequency of micturition: Secondary | ICD-10-CM

## 2017-03-10 LAB — BASIC METABOLIC PANEL
BUN: 12 mg/dL (ref 6–23)
CO2: 33 mEq/L — ABNORMAL HIGH (ref 19–32)
Calcium: 10.1 mg/dL (ref 8.4–10.5)
Chloride: 98 mEq/L (ref 96–112)
Creatinine, Ser: 0.85 mg/dL (ref 0.40–1.50)
GFR: 91.91 mL/min (ref >60.00)
Glucose, Bld: 99 mg/dL (ref 70–99)
POTASSIUM: 3.8 meq/L (ref 3.5–5.1)
SODIUM: 137 meq/L (ref 135–145)

## 2017-03-10 LAB — LIPID PANEL
CHOL/HDL RATIO: 5
Cholesterol: 151 mg/dL (ref 0–200)
HDL: 30.4 mg/dL — ABNORMAL LOW (ref >39.00)
LDL Cholesterol: 92 mg/dL (ref 0–99)
NONHDL: 120.54
Triglycerides: 143 mg/dL (ref 0.0–149.0)
VLDL: 28.6 mg/dL (ref 0.0–40.0)

## 2017-03-10 LAB — PSA, MEDICARE: PSA: 6.4 ng/ml — ABNORMAL HIGH (ref 0.10–4.00)

## 2017-03-10 LAB — HEMOGLOBIN A1C: Hgb A1c MFr Bld: 5.9 % (ref 4.6–6.5)

## 2017-03-10 LAB — HEPATIC FUNCTION PANEL
ALK PHOS: 40 U/L (ref 39–117)
ALT: 12 U/L (ref 0–53)
AST: 17 U/L (ref 0–37)
Albumin: 4.2 g/dL (ref 3.5–5.2)
BILIRUBIN DIRECT: 0.2 mg/dL (ref 0.0–0.3)
BILIRUBIN TOTAL: 0.9 mg/dL (ref 0.2–1.2)
Total Protein: 6.8 g/dL (ref 6.0–8.3)

## 2017-03-10 MED ORDER — OLMESARTAN MEDOXOMIL 40 MG PO TABS: 40.0000 mg | ORAL_TABLET | Freq: Every day | ORAL | 1 refills | Status: DC

## 2017-03-10 NOTE — Assessment & Plan Note (Signed)
At goal with amlodipine, HCTZ and valsartan Valsartan switched to benicar due to recent medication recall.

## 2017-03-10 NOTE — Assessment & Plan Note (Signed)
PSA of 6 today. Repeat in 34months

## 2017-03-10 NOTE — Patient Instructions (Addendum)
Stable labs results except elevated PSA. Will repeat PSA in 67months. Need to change valsartan to benicar due to recent medication recall. Prescription sent.  Patient decline used of another antispamodic and/or flomax. Advised to avoid fluid intake after 6pm to minimize nocturnal urination.

## 2017-03-10 NOTE — Progress Notes (Signed)
Subjective:  Patient ID: Jack [name], male    DOB: February 26, 1934  Age: 83 y.o. MRN: [id number]  CC: Follow-up (3 month); Hypertension; and Hyperlipidemia   HPI   HTN: Stable with amlodipine, HCTZ and valsartan. Need to change valsartan to another ARB due to recent medication recall. BP Readings from Last 3 Encounters:  03/10/17 132/78  12/24/16 (!) 143/82  12/07/16 122/70   Hyperlipidemia: No medication use at this time.  Constipation improved with colace.  Urinary frequency, urgency and incontinence: Hx of BPH without obstruction. Evaluated by urology in past. Wakes up to urinate 4times at night. Several medications used with no improvement. He is unable to remember names of medications used in past.  Outpatient Medications Prior to Visit  Medication Sig Dispense Refill  . acetaminophen (TYLENOL) 325 MG tablet Take 1-2 tablets (325-650 mg total) by mouth every 4 (four) hours as needed for mild pain.    Marland Kitchen albuterol (PROVENTIL HFA;VENTOLIN HFA) 108 (90 Base) MCG/ACT inhaler Inhale 2 puffs into the lungs every 6 (six) hours as needed for wheezing or shortness of breath. 1 Inhaler 0  . amLODipine (NORVASC) 5 MG tablet Take 1 tablet (5 mg total) by mouth at bedtime. 90 tablet 1  . aspirin 325 MG EC tablet Take 1 tablet (325 mg total) by mouth daily. 90 tablet 1  . docusate sodium (COLACE) 100 MG capsule Take 1 capsule (100 mg total) by mouth 2 (two) times daily. (Patient taking differently: Take 100 mg by mouth daily as needed. ) 120 capsule 1  . MAGNESIUM PO Take 1 tablet by mouth daily.     Marland Kitchen omeprazole (PRILOSEC) 20 MG capsule Take 1 capsule (20 mg total) by mouth daily. 90 capsule 1  . pravastatin (PRAVACHOL) 40 MG tablet Take 1 tablet (40 mg total) by mouth daily at 6 PM. 90 tablet 1  . valsartan (DIOVAN) 160 MG tablet Take 160 mg by mouth daily.    . cholecalciferol (VITAMIN D) 400 units TABS tablet Take 400 Units by mouth daily.     No facility-administered medications  prior to visit.     ROS See HPI  Objective:  BP 132/78   Pulse 85   Temp 98 F (36.7 C) (Oral)   Ht '5\' 2"'  (1.575 m)   Wt 143 lb (64.9 kg)   SpO2 97%   BMI 26.16 kg/m   BP Readings from Last 3 Encounters:  03/10/17 132/78  12/24/16 (!) 143/82  12/07/16 122/70    Wt Readings from Last 3 Encounters:  03/10/17 143 lb (64.9 kg)  12/24/16 146 lb (66.2 kg)  12/07/16 147 lb (66.7 kg)    Physical Exam  Constitutional: He is oriented to person, place, and time. No distress.  Cardiovascular: Normal rate.   Murmur heard. Irregular hearst sounds  Pulmonary/Chest: Effort normal and breath sounds normal.  Abdominal: Soft. Bowel sounds are normal. He exhibits no distension. There is no tenderness.  Musculoskeletal: He exhibits no edema.  Neurological: He is alert and oriented to person, place, and time.  Ambulates with cane  Vitals reviewed.  CBC    Component Value Date/Time   WBC 7.2 07/27/2016 2016   RBC 4.58 07/27/2016 2016   HGB 13.3 07/27/2016 2016   HCT 39.7 07/27/2016 2016   PLT 356 07/27/2016 2016   MCV 86.7 07/27/2016 2016   MCH 29.0 07/27/2016 2016   MCHC 33.5 07/27/2016 2016   RDW 14.1 07/27/2016 2016   LYMPHSABS 1.8 07/27/2016 2016   MONOABS 0.8  07/27/2016 2016   EOSABS 0.1 07/27/2016 2016   BASOSABS 0.1 07/27/2016 2016   Hepatic Function Latest Ref Rng & Units 03/10/2017 07/13/2016 03/03/2015  Total Protein 6.0 - 8.3 g/dL 6.8 7.0 6.8  Albumin 3.5 - 5.2 g/dL 4.2 3.9 4.0  AST 0 - 37 U/L '17 26 17  ' ALT 0 - 53 U/L '12 25 12  ' Alk Phosphatase 39 - 117 U/L 40 39 36(L)  Total Bilirubin 0.2 - 1.2 mg/dL 0.9 0.8 0.6  Bilirubin, Direct 0.0 - 0.3 mg/dL 0.2 - -   BMP Latest Ref Rng & Units 03/10/2017 09/07/2016 07/27/2016  Glucose 70 - 99 mg/dL 99 91 106(H)  BUN 6 - 23 mg/dL '12 17 15  ' Creatinine 0.40 - 1.50 mg/dL 0.85 0.89 0.75  Sodium 135 - 145 mEq/L 137 138 133(L)  Potassium 3.5 - 5.1 mEq/L 3.8 3.2(L) 3.2(L)  Chloride 96 - 112 mEq/L 98 98 92(L)  CO2 19 - 32 mEq/L  33(H) 32 30  Calcium 8.4 - 10.5 mg/dL 10.1 9.7 9.0     Dg Chest 2 View  Result Date: 07/27/2016 CLINICAL DATA:  SOB and productive cough x 3 days with audible wheezing. - yellow No pain EXAM: CHEST  2 VIEW COMPARISON:  Chest x-ray dated 07/27/2016. FINDINGS: Heart size and mediastinal contours are stable. Median sternotomy wires appear intact and stable in alignment. Valvular hardware appears stable in position. Lungs are clear. No pleural effusion or pneumothorax seen. No acute or suspicious osseous finding. IMPRESSION: No active cardiopulmonary disease. No evidence of pneumonia or pulmonary edema. Electronically Signed   By: Franki Cabot M.D.   On: 07/27/2016 21:02   Dg Chest 2 View  Result Date: 07/27/2016 CLINICAL DATA:  Wheezing and dyspnea EXAM: CHEST  2 VIEW COMPARISON:  03/21/2006 CXR FINDINGS: Stable cardiomegaly with aortic atherosclerosis. The patient is status median sternotomy with cardiac valvular replacement. Atelectasis at each lung base left greater than right. No confluent airspace disease or overt pulmonary edema. Probable small hiatal hernia. No acute osseous abnormality. IMPRESSION: No active cardiopulmonary disease.  Bibasilar atelectasis. Electronically Signed   By: Ashley Royalty M.D.   On: 07/27/2016 18:03   Ct Angio Chest Pe W And/or Wo Contrast  Result Date: 07/28/2016 CLINICAL DATA:  Cough and shortness of breath.  Elevated D-dimer. EXAM: CT ANGIOGRAPHY CHEST WITH CONTRAST TECHNIQUE: Multidetector CT imaging of the chest was performed using the standard protocol during bolus administration of intravenous contrast. Multiplanar CT image reconstructions and MIPs were obtained to evaluate the vascular anatomy. CONTRAST:  100 cc Isovue 370 COMPARISON:  Radiographs earlier this day. FINDINGS: Cardiovascular: There are no filling defects within the pulmonary arteries to suggest pulmonary embolus. Atherosclerosis and tortuosity of the thoracic aorta without evidence of dissection.  Post mitral and tricuspid valve replacements. Multi chamber cardiomegaly. There are coronary artery calcifications. Mediastinum/Nodes: No enlarged mediastinal or hilar lymph nodes. Patulous esophagus with small hiatal hernia. Trachea is patent. Questionable linear debris/ secretions within the left lower lobe bronchus, partially obscured by motion. Lungs/Pleura: Narrowing of the left lower lobe bronchus due to compression from the pulmonary artery and adjacent aorta. Scattered atelectasis or scarring in the left lower lobe. No confluent airspace disease. No evidence of pulmonary edema. No pleural fluid. Upper Abdomen: No acute abnormality. Musculoskeletal: Post median sternotomy. There are no acute or suspicious osseous abnormalities. Pseudoarthrosis between lateral right first and second rib, may be sequela of remote injury. Review of the MIP images confirms the above findings. IMPRESSION: 1. No pulmonary embolus.  2. Questionable linear debris/ secretions in the left lower lobe bronchus, partially obscured by motion artifact. 3. Atherosclerosis including coronary artery calcifications. Prosthetic mitral and tricuspid valves with mild multi chamber cardiomegaly. Electronically Signed   By: Jeb Levering M.D.   On: 07/28/2016 00:29    Assessment & Plan:   Shadrick was seen today for follow-up, hypertension and hyperlipidemia.  Diagnoses and all orders for this visit:  Essential hypertension -     Basic metabolic panel; Future -     olmesartan (BENICAR) 40 MG tablet; Take 1 tablet (40 mg total) by mouth daily.  Benign prostatic hyperplasia with urinary frequency -     PSA, Medicare; Future  Mixed hyperlipidemia -     Hepatic function panel; Future -     Lipid panel; Future  Paresthesia and pain of left extremity  Hyperglycemia -     Hemoglobin A1c; Future  Elevated PSA, less than 10 ng/ml   I have discontinued Mr. Goldston cholecalciferol and valsartan. I am also having him start on  olmesartan. Additionally, I am having him maintain his acetaminophen, MAGNESIUM PO, albuterol, docusate sodium, amLODipine, aspirin, omeprazole, pravastatin, and hydrochlorothiazide.  Meds ordered this encounter  Medications  . hydrochlorothiazide (HYDRODIURIL) 25 MG tablet    Sig: Take 25 mg by mouth daily.   Marland Kitchen olmesartan (BENICAR) 40 MG tablet    Sig: Take 1 tablet (40 mg total) by mouth daily.    Dispense:  90 tablet    Refill:  1    Order Specific Question:   Supervising Provider    Answer:   Cassandria Anger [[id number]]    Follow-up: Return in about 6 months (around 09/08/2017) for hyperlipidemia and HTN ([place] location).  Wilfred Lacy, NP

## 2017-03-15 ENCOUNTER — Encounter: Payer: Self-pay | Admitting: Nurse Practitioner

## 2017-03-15 ENCOUNTER — Ambulatory Visit (INDEPENDENT_AMBULATORY_CARE_PROVIDER_SITE_OTHER): Payer: Medicare Other | Admitting: Nurse Practitioner

## 2017-03-15 ENCOUNTER — Ambulatory Visit: Payer: Medicare Other | Admitting: Nurse Practitioner

## 2017-03-15 VITALS — BP 127/73 | HR 86 | Wt 145.8 lb

## 2017-03-15 DIAGNOSIS — E782 Mixed hyperlipidemia: Secondary | ICD-10-CM

## 2017-03-15 DIAGNOSIS — I482 Chronic atrial fibrillation, unspecified: Secondary | ICD-10-CM

## 2017-03-15 DIAGNOSIS — I10 Essential (primary) hypertension: Secondary | ICD-10-CM

## 2017-03-15 DIAGNOSIS — I610 Nontraumatic intracerebral hemorrhage in hemisphere, subcortical: Secondary | ICD-10-CM

## 2017-03-15 DIAGNOSIS — I639 Cerebral infarction, unspecified: Secondary | ICD-10-CM

## 2017-03-15 NOTE — Patient Instructions (Signed)
Stressed the importance of management of risk factors to prevent further stroke Continue aspirin for secondary stroke prevention Maintain strict control of hypertension with blood pressure goal below 130/90, today's reading127/73  continue antihypertensive medications Control of diabetes with hemoglobin A1c below 6.5 followed by primary care Cholesterol with LDL cholesterol less than 70, followed by primary care,  continue statin drugs pravastatin Exercise by walking, recumbent bike , use cane at all times for safe ambulation   eat healthy diet with whole grains,  fresh fruits and vegetables Follow-up in 6 months

## 2017-03-15 NOTE — Progress Notes (Signed)
I reviewed above note and agree with the assessment and plan.  Rosalin Hawking, MD PhD Stroke Neurology 03/15/2017 5:32 PM

## 2017-03-15 NOTE — Progress Notes (Signed)
[facility]  PATIENT: Jack [name] DOB: 1934-03-30   REASON FOR VISIT: Follow-up for stroke HISTORY FROM: Patient and wife    HISTORY OF PRESENT ILLNESS:UPDATE 09/04/2018CM Jack [name], 83 year old male returns for follow-up with history of stroke event in December 2017. Patient also has a history of atrial fibrillation and previous intracranial bleed 2015 and she was not on anticoagulation despite having atrial fibrillation. when admitted. He is currently on aspirin for secondary stroke prevention without further stroke TIA symptoms. He has no bruising and no  bleeding Blood pressure in the office today 127/73. He remains on Pravachol for hyperlipidemia without myalgias. He continues to ambulate with single-point cane, denies any falls. He has completed all outpatient therapies and is inconsistent with his home exercise program however he does use his recumbent bike at the gym. He returns for reevaluation   HISTORY 09/09/16 Dr Deloria Lair Nong is a 19 year Caucasian male seen today for first office follow-up visit for hospital admission for stroke in December 2017. He is accompanied by his wife. History is obtained from the patient, wife and review of Hospital medical records and I have personally reviewed imaging studies.Jack [name] an 83 y.o.malewith a history of stroke 2 years agowith residual left hemiparesis,hypertension, hyperlipidemia and atrial fibrillation not on anticoagulation, seen in the ED at [facility] with a complaint of new onset right-sided weakness. Deficit was present when he woke up this morning. He was last known well at 10:00 last night. His wife noted difficulty with ambulation with coordination of his right lower extremity aswell as coordination of his right upper extremity using his cane.CT scan of his head showed no acute changes. MRI is pending. NIH stroke score at the time of this evaluation was 2. Patient was LKW 10:00 PM on 07/02/2016. tPA was not  given as beyond time under for treatment consideration when he arrived in the ED. the patient and her hemorrhagic stroke in 2015 with right thalamic hemorrhage and hence was not a candidate for anticoagulation despite having atrial fibrillation. He was enrolled in [facility] 2 trial for blood pressure control at that time. He underwent mitral valvuloplasty in 2007 and a maze procedure with essentially occluded left atrial appendage. CT scan of the brain on admission showed multiple old lacunar infarcts. MRI scan showed small acute cortical and subcortical left frontoparietal infarcts with a punctate infarct in the right occipital cortex is patent. There are chronic infarcts in both cerebellum and subcortical white matter. Lower extremity venous Dopplers were negative for DVT. Carotid ultrasound showed no significant extracranial stenosis. LDL cholesterol was 84 mg percent. Hemoglobin A1c was 5.5. Transthoracic echo showed normal ejection fraction with mitral annuloplasty ring in place  without significant stenosis.CHA2DS2-VASc Score = 5. He had a history of patent foramen ovale but lower extremity venous Dopplers current admission were negative. He has finished outpatient therapies and is currently walking with a cane for long distances but can walk without it in dose. He is pretty safe and not had any falls or injuries. His back on his aspirin and tolerating it well. His blood pressure is well controlled and today in fact it is low at 107/69. Is tolerating Pravachol well without muscle aches or pains.   REVIEW OF SYSTEMS: Full 14 system review of systems performed and notable only for those listed, all others are neg:  Constitutional: neg  Cardiovascular: neg Ear/Nose/Throat: neg  Skin: neg Eyes: neg Respiratory: neg Gastroitestinal: Urinary urgency  Hematology/Lymphatic: neg  Endocrine: neg Musculoskeletal:neg Allergy/Immunology:  neg Neurological: neg Psychiatric: neg Sleep : neg   ALLERGIES: No  Known Allergies  HOME MEDICATIONS: Outpatient Medications Prior to Visit  Medication Sig Dispense Refill  . acetaminophen (TYLENOL) 325 MG tablet Take 1-2 tablets (325-650 mg total) by mouth every 4 (four) hours as needed for mild pain.    Marland Kitchen albuterol (PROVENTIL HFA;VENTOLIN HFA) 108 (90 Base) MCG/ACT inhaler Inhale 2 puffs into the lungs every 6 (six) hours as needed for wheezing or shortness of breath. 1 Inhaler 0  . amLODipine (NORVASC) 5 MG tablet Take 1 tablet (5 mg total) by mouth at bedtime. 90 tablet 1  . aspirin 325 MG EC tablet Take 1 tablet (325 mg total) by mouth daily. 90 tablet 1  . docusate sodium (COLACE) 100 MG capsule Take 1 capsule (100 mg total) by mouth 2 (two) times daily. (Patient taking differently: Take 100 mg by mouth daily as needed. ) 120 capsule 1  . hydrochlorothiazide (HYDRODIURIL) 25 MG tablet Take 25 mg by mouth daily.     Marland Kitchen MAGNESIUM PO Take 1 tablet by mouth daily.     Marland Kitchen olmesartan (BENICAR) 40 MG tablet Take 1 tablet (40 mg total) by mouth daily. 90 tablet 1  . omeprazole (PRILOSEC) 20 MG capsule Take 1 capsule (20 mg total) by mouth daily. 90 capsule 1  . pravastatin (PRAVACHOL) 40 MG tablet Take 1 tablet (40 mg total) by mouth daily at 6 PM. 90 tablet 1   No facility-administered medications prior to visit.     PAST MEDICAL HISTORY: Past Medical History:  Diagnosis Date  . Atrial fibrillation ([place])    Dr. Tollie Eth  . BPH (benign prostatic hypertrophy) 2012   prior therapy with finasteride, but as of 03/2015 no c/o; Dr. Luanne Bras  . Cellulitis of face 11/08   [facility]  . CVA (cerebral infarction) 2009, 2015  . Echocardiogram abnormal 02/11/2010   LVEF 70%, severe mitral regurgitation, pulm HTN   . ED (erectile dysfunction)    Viagra prn, consult with Urology 2012  . Elevated PSA 2012   BPH; Dr. Luanne Bras, [facility]; no more PSAs needed as of 2012 consult  . Gait abnormality    uses cane, s/p CVA  . Hemiparesis  affecting left side as late effect of cerebrovascular accident ([place])   . Hiatal hernia    per 2008 EUS  . Hyperlipidemia   . Hypertension   . Microscopic hematuria    chronic, eval 2012 with Urology, benign at that time  . Obstructive uropathy 2012   Dr. Luanne Bras  . Pancreatic mass 01/2006   inconsequential per CT 2007  . 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 Patent foramen ovale    hx/o, notation of fistula on echocardiogram from 2009  . Prophylactic antibiotic 2016   requires due to valve disease  . Schatzki's ring 2008   per endoscopic ultrasound; Dr. Janetta Hora. [name]  . Stroke ([place])   . Ventral hernia    surgery consult 01/2010 with Dr. Excell Seltzer, elective repair if desired , but he declined  . Wears hearing aid     PAST SURGICAL HISTORY: Past Surgical History:  Procedure Laterality Date  . CARDIAC CATHETERIZATION  02/2010   LVEF 70%, severe mitral regurgitation, pulm HTN; prior in 01/2006  . INGUINAL HERNIA REPAIR     bilat  . MITRAL VALVE REPAIR  01/2006   mitral repair with Maze procedure, tricuspid annuloplasty, MAZE and LAA oversewn  by Dr. Roxy Manns    FAMILY HISTORY: Family History  Problem Relation Age of Onset  . Family history unknown: Yes    SOCIAL HISTORY: Social History   Social History  . Marital status: Married    Spouse name: [name]  . Number of children: N/A  . Years of education: N/A   Occupational History  . Not on file.   Social History Main Topics  . Smoking status: Never Smoker  . Smokeless tobacco: Never Used  . Alcohol use No  . Drug use: No  . Sexual activity: Not on file   Other Topics Concern  . Not on file   Social History Narrative   Married, exercises with stationary bike 5 days per week.   Eats healthy.  As of 03/2015     PHYSICAL EXAM  Vitals:   03/15/17 1342  BP: 127/73  Pulse: 86  Weight: 145 lb 12.8 oz (66.1 kg)   Body mass index is 26.67  kg/m.  Generalized: Well developed, in no acute distress  Head: normocephalic and atraumatic,. Oropharynx benign  Neck: Supple, no carotid bruits  Cardiac: Regular rate rhythm, no murmur  Musculoskeletal: No deformity   Neurological examination   Mentation: Alert oriented to time, place, history taking. Attention span and concentration appropriate. Recent and remote memory intact.  Follows all commands speech and language fluent.   Cranial nerve II-XII: Pupils were equal round reactive to light extraocular movements were full, visual field were full on confrontational test. Facial sensation and strength were normal. hearing was intact to finger rubbing bilaterally. Uvula tongue midline. head turning and shoulder shrug were normal and symmetric.Tongue protrusion into cheek strength was normal. Motor: normal bulk and tone, full strength in the BUE, BLE, Except mild weakness of left grip and intrinsic hand muscles . Mild weakness of left ankle dorsiflexors  Sensory: normal and symmetric to light touch, pinprick, and  Vibration,  Coordination: finger-nose-finger, heel-to-shin bilaterally, no dysmetria Reflexes: 1+ upper lower and symmetric plantar responses were flexor bilaterally. Gait and Station: Rising up from seated position without assistance, normal stance,  moderate stride, unable to heel toe or tandem walk without difficulty. Ambulates with single-point cane DIAGNOSTIC DATA (LABS, IMAGING, TESTING) - I reviewed patient records, labs, notes, testing and imaging myself where available.  Lab Results  Component Value Date   WBC 7.2 07/27/2016   HGB 13.3 07/27/2016   HCT 39.7 07/27/2016   MCV 86.7 07/27/2016   PLT 356 07/27/2016      Component Value Date/Time   NA 137 03/10/2017 1100   K 3.8 03/10/2017 1100   CL 98 03/10/2017 1100   CO2 33 (H) 03/10/2017 1100   GLUCOSE 99 03/10/2017 1100   BUN 12 03/10/2017 1100   CREATININE 0.85 03/10/2017 1100   CREATININE 0.78 03/03/2015  0001   CALCIUM 10.1 03/10/2017 1100   PROT 6.8 03/10/2017 1100   ALBUMIN 4.2 03/10/2017 1100   AST 17 03/10/2017 1100   ALT 12 03/10/2017 1100   ALKPHOS 40 03/10/2017 1100   BILITOT 0.9 03/10/2017 1100   GFRNONAA >60 07/27/2016 2016   GFRAA >60 07/27/2016 2016   Lab Results  Component Value Date   CHOL 151 03/10/2017   HDL 30.40 (L) 03/10/2017   LDLCALC 92 03/10/2017   TRIG 143.0 03/10/2017   CHOLHDL 5 03/10/2017   Lab Results  Component Value Date   HGBA1C 5.9 03/10/2017   No results found for: [id number] Lab Results  Component Value Date   TSH 1.446 Test  methodology is 3rd generation TSH 12/12/2007      ASSESSMENT AND PLAN 5 year caucasian male with cardiac embolic left frontal MCA branch infarct in December 2017 secondary to atrial fibrillation. History of right thalamic intracerebral hemorrhage in January 2015 mild left hemiparesis. Patient is reluctant to consider anticoagulation and not a candidate for watchman procedure since he has had a maze procedure.The patient is a current patient of Dr. Leonie Man  who is out of the office today . This note is sent to the work in doctor.     Stressed the importance of management of risk factors to prevent further stroke Continue aspirin for secondary stroke prevention Maintain strict control of hypertension with blood pressure goal below 130/90, today's reading127/73  continue antihypertensive medications Control of diabetes with hemoglobin A1c below 6.5 followed by primary care Cholesterol with LDL cholesterol less than 70, followed by primary care,  continue statin drugs pravastatin Exercise by walking, recumbent bike , use cane at all times for safe ambulation   eat healthy diet with whole grains,  fresh fruits and vegetables Follow-up in 6 months I spent 25 minutes in total face to face time with the patient more than 50% of which was spent counseling and coordination of care, reviewing test results reviewing medications and  discussing and reviewing the diagnosis of stroke and management of risk factors Dennie Bible, [facility], [facility], APRN  [facility] [address] [place], [place] [address] [phone]

## 2017-04-18 ENCOUNTER — Other Ambulatory Visit: Payer: Self-pay | Admitting: Nurse Practitioner

## 2017-04-18 DIAGNOSIS — K5900 Constipation, unspecified: Secondary | ICD-10-CM

## 2017-06-24 ENCOUNTER — Other Ambulatory Visit: Payer: Self-pay | Admitting: Nurse Practitioner

## 2017-06-24 DIAGNOSIS — M7989 Other specified soft tissue disorders: Secondary | ICD-10-CM

## 2017-06-24 DIAGNOSIS — I10 Essential (primary) hypertension: Secondary | ICD-10-CM

## 2017-06-24 NOTE — Telephone Encounter (Signed)
Left vm for the pt to call back, need to know if he still taking this med and what is the dosage he is taking because we havent send in this med for a long time.

## 2017-06-24 NOTE — Telephone Encounter (Signed)
Copied from [place]. Topic: Quick Communication - Office Called Patient >> Jun 24, 2017  9:22 AM Robina Ade, Helene Kelp D wrote: Reason for CRM: Patient call back return office call. Patient said that he is still taking hydrochlorothiazide (HYDRODIURIL) 25 MG tablet everyday. He takes 1 daily.

## 2017-09-09 NOTE — Progress Notes (Signed)
[facility]  PATIENT: Jack [name] DOB: 1933/09/03   REASON FOR VISIT: Follow-up for stroke HISTORY FROM: Patient and wife   HISTORY OF PRESENT ILLNESS:  UPDATE 09/12/17 (JV): Jack [name] is being seen today for six-month follow-up.  He is accompanied by his wife.  Patient continues to take aspirin without side effects of increased bleeding or bruising.  Patient continues to take pravastatin without side effects of myalgias.  Blood pressure is satisfactory and at today's visit is 132/75.  Patient does have cane to assist with ambulation.  Patient denies new or worsening stroke/TIA symptoms.  UPDATE 03/15/2017 (CM) Jack [name], 84 year old male returns for follow-up with history of stroke event in December 2017. Patient also has a history of atrial fibrillation and previous intracranial bleed 2015 and she was not on anticoagulation despite having atrial fibrillation. when admitted. He is currently on aspirin for secondary stroke prevention without further stroke TIA symptoms. He has no bruising and no  bleeding Blood pressure in the office today 127/73. He remains on Pravachol for hyperlipidemia without myalgias. He continues to ambulate with single-point cane, denies any falls. He has completed all outpatient therapies and is inconsistent with his home exercise program however he does use his recumbent bike at the gym. He returns for reevaluation   HISTORY 09/09/16  9Dr Leonie Man) Mr [name] is a 23 year Caucasian male seen today for first office follow-up visit for hospital admission for stroke in December 2017. He is accompanied by his wife. History is obtained from the patient, wife and review of Hospital medical records and I have personally reviewed imaging studies.Jack [name] an 84 y.o.malewith a history of stroke 2 years agowith residual left hemiparesis,hypertension, hyperlipidemia and atrial fibrillation not on anticoagulation, seen in the ED at [facility] with a complaint of  new onset right-sided weakness. Deficit was present when he woke up this morning. He was last known well at 10:00 last night. His wife noted difficulty with ambulation with coordination of his right lower extremity aswell as coordination of his right upper extremity using his cane.CT scan of his head showed no acute changes. MRI is pending. NIH stroke score at the time of this evaluation was 2. Patient was LKW 10:00 PM on 07/02/2016. tPA was not given as beyond time under for treatment consideration when he arrived in the ED. the patient and her hemorrhagic stroke in 2015 with right thalamic hemorrhage and hence was not a candidate for anticoagulation despite having atrial fibrillation. He was enrolled in [facility] 2 trial for blood pressure control at that time. He underwent mitral valvuloplasty in 2007 and a maze procedure with essentially occluded left atrial appendage. CT scan of the brain on admission showed multiple old lacunar infarcts. MRI scan showed small acute cortical and subcortical left frontoparietal infarcts with a punctate infarct in the right occipital cortex is patent. There are chronic infarcts in both cerebellum and subcortical white matter. Lower extremity venous Dopplers were negative for DVT. Carotid ultrasound showed no significant extracranial stenosis. LDL cholesterol was 84 mg percent. Hemoglobin A1c was 5.5. Transthoracic echo showed normal ejection fraction with mitral annuloplasty ring in place  without significant stenosis.CHA2DS2-VASc Score = 5. He had a history of patent foramen ovale but lower extremity venous Dopplers current admission were negative. He has finished outpatient therapies and is currently walking with a cane for long distances but can walk without it in dose. He is pretty safe and not had any falls or injuries. His back on his  aspirin and tolerating it well. His blood pressure is well controlled and today in fact it is low at 107/69. Is tolerating Pravachol well  without muscle aches or pains.     REVIEW OF SYSTEMS: Full 14 system review of systems performed and notable only for those listed, all others are neg:  Constitutional: neg  Cardiovascular: neg Ear/Nose/Throat: Hard of hearing Skin: neg Eyes: neg Respiratory: neg Gastroitestinal: Urinary urgency  Hematology/Lymphatic: neg  Endocrine: neg Musculoskeletal:neg Allergy/Immunology: neg Neurological: Numbness/tingling in left upper and lower extremity Psychiatric: neg Sleep : neg   ALLERGIES: No Known Allergies  HOME MEDICATIONS: Outpatient Medications Prior to Visit  Medication Sig Dispense Refill  . acetaminophen (TYLENOL) 325 MG tablet Take 1-2 tablets (325-650 mg total) by mouth every 4 (four) hours as needed for mild pain.    Marland Kitchen albuterol (PROVENTIL HFA;VENTOLIN HFA) 108 (90 Base) MCG/ACT inhaler Inhale 2 puffs into the lungs every 6 (six) hours as needed for wheezing or shortness of breath. 1 Inhaler 0  . amLODipine (NORVASC) 5 MG tablet Take 1 tablet (5 mg total) by mouth at bedtime. 90 tablet 1  . aspirin 325 MG EC tablet Take 1 tablet (325 mg total) by mouth daily. 90 tablet 1  . DOK 100 MG capsule TAKE 1 CAPSULE(100 MG) BY MOUTH TWICE DAILY 180 capsule 3  . hydrochlorothiazide (HYDRODIURIL) 25 MG tablet TAKE 1 TABLET BY MOUTH DAILY 90 tablet 1  . MAGNESIUM PO Take 1 tablet by mouth daily.     Marland Kitchen olmesartan (BENICAR) 40 MG tablet Take 1 tablet (40 mg total) by mouth daily. 90 tablet 1  . omeprazole (PRILOSEC) 20 MG capsule Take 1 capsule (20 mg total) by mouth daily. 90 capsule 1  . pravastatin (PRAVACHOL) 40 MG tablet Take 1 tablet (40 mg total) by mouth daily at 6 PM. 90 tablet 1   No facility-administered medications prior to visit.     PAST MEDICAL HISTORY: Past Medical History:  Diagnosis Date  . Atrial fibrillation ([place])    Dr. Tollie Eth  . BPH (benign prostatic hypertrophy) 2012   prior therapy with finasteride, but as of 03/2015 no c/o; Dr. Luanne Bras  . Cellulitis of face 11/08   [facility]  . CVA (cerebral infarction) 2009, 2015  . Echocardiogram abnormal 02/11/2010   LVEF 70%, severe mitral regurgitation, pulm HTN   . ED (erectile dysfunction)    Viagra prn, consult with Urology 2012  . Elevated PSA 2012   BPH; Dr. Luanne Bras, [facility]; no more PSAs needed as of 2012 consult  . Gait abnormality    uses cane, s/p CVA  . Hemiparesis affecting left side as late effect of cerebrovascular accident ([place])   . Hiatal hernia    per 2008 EUS  . Hyperlipidemia   . Hypertension   . Microscopic hematuria    chronic, eval 2012 with Urology, benign at that time  . Obstructive uropathy 2012   Dr. Luanne Bras  . Pancreatic mass 01/2006   inconsequential per CT 2007  . 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 Patent foramen ovale    hx/o, notation of fistula on echocardiogram from 2009  . Prophylactic antibiotic 2016   requires due to valve disease  . Schatzki's ring 2008   per endoscopic ultrasound; Dr. Janetta Hora. [name]  . Stroke ([place])   . Ventral hernia    surgery consult 01/2010 with Dr. Excell Seltzer, elective repair if desired , but he  declined  . Wears hearing aid     PAST SURGICAL HISTORY: Past Surgical History:  Procedure Laterality Date  . CARDIAC CATHETERIZATION  02/2010   LVEF 70%, severe mitral regurgitation, pulm HTN; prior in 01/2006  . INGUINAL HERNIA REPAIR     bilat  . MITRAL VALVE REPAIR  01/2006   mitral repair with Maze procedure, tricuspid annuloplasty, MAZE and LAA oversewn by Dr. Roxy Manns    FAMILY HISTORY: Family History  Family history unknown: Yes    SOCIAL HISTORY: Social History   Socioeconomic History  . Marital status: Married    Spouse name: [name]  . Number of children: Not on file  . Years of education: Not on file  . Highest education level: Not on file  Social Needs  . Financial resource strain: Not on file    . Food insecurity - worry: Not on file  . Food insecurity - inability: Not on file  . Transportation needs - medical: Not on file  . Transportation needs - non-medical: Not on file  Occupational History  . Not on file  Tobacco Use  . Smoking status: Never Smoker  . Smokeless tobacco: Never Used  Substance and Sexual Activity  . Alcohol use: No  . Drug use: No  . Sexual activity: Not on file  Other Topics Concern  . Not on file  Social History Narrative   Married, exercises with stationary bike 5 days per week.   Eats healthy.  As of 03/2015     PHYSICAL EXAM  Vitals:   09/12/17 1322  BP: 132/75  Pulse: 93  Weight: 144 lb (65.3 kg)   Body mass index is 26.34 kg/m.  Generalized: Well developed, elderly Caucasian male, in no acute distress  Head: normocephalic and atraumatic,. Oropharynx benign  Neck: Supple, no carotid bruits  Cardiac: Regular rate rhythm, no murmur  Musculoskeletal: No deformity   Neurological examination   Mentation: Alert oriented to time, place, history taking. Attention span and concentration appropriate. Recent and remote memory intact.  Follows all commands speech and language fluent.   Cranial nerve II-XII: Pupils were equal round reactive to light extraocular movements were full, visual field were full on confrontational test. Facial sensation normal with a slight left-sided asymmetry. hearing was intact to finger rubbing bilaterally. Uvula tongue midline. head turning and shoulder shrug were normal and symmetric.Tongue protrusion into cheek strength was normal. Motor: normal bulk and tone, full strength in the BLE and LUE. Moderate weakness of left bicep, left grip and intrinsic hand muscles . Mild weakness of left ankle dorsiflexors  Sensory: Slight decrease in sensation on left upper and lower extremity Coordination: finger-nose-finger, heel-to-shin bilaterally, no dysmetria Reflexes: Brisk reflexes noted in all extremities Gait and Station:  Rising up from seated position without assistance, normal stance, slow stride with a stiff left leg,, unable to heel toe or tandem walk. Ambulates with single-point cane   DIAGNOSTIC DATA (LABS, IMAGING, TESTING) - I reviewed patient records, labs, notes, testing and imaging myself where available.  Lab Results  Component Value Date   WBC 7.2 07/27/2016   HGB 13.3 07/27/2016   HCT 39.7 07/27/2016   MCV 86.7 07/27/2016   PLT 356 07/27/2016      Component Value Date/Time   NA 137 03/10/2017 1100   K 3.8 03/10/2017 1100   CL 98 03/10/2017 1100   CO2 33 (H) 03/10/2017 1100   GLUCOSE 99 03/10/2017 1100   BUN 12 03/10/2017 1100   CREATININE 0.85 03/10/2017 1100  CREATININE 0.78 03/03/2015 0001   CALCIUM 10.1 03/10/2017 1100   PROT 6.8 03/10/2017 1100   ALBUMIN 4.2 03/10/2017 1100   AST 17 03/10/2017 1100   ALT 12 03/10/2017 1100   ALKPHOS 40 03/10/2017 1100   BILITOT 0.9 03/10/2017 1100   GFRNONAA >60 07/27/2016 2016   GFRAA >60 07/27/2016 2016   Lab Results  Component Value Date   CHOL 151 03/10/2017   HDL 30.40 (L) 03/10/2017   LDLCALC 92 03/10/2017   TRIG 143.0 03/10/2017   CHOLHDL 5 03/10/2017   Lab Results  Component Value Date   HGBA1C 5.9 03/10/2017   No results found for: VITAMINB12 Lab Results  Component Value Date   TSH 1.446 Test methodology is 3rd generation TSH 12/12/2007      ASSESSMENT AND PLAN 58 year caucasian male with cardiac embolic left frontal MCA branch infarct in December 2017 secondary to atrial fibrillation not on anticoagulation due to history of right thalamic intracerebral hemorrhage in January 2015 with mild left hemiparesis.  Vascular risk factors include HTN, HLD, and atrial fibrillation.  Patient has no new complaints at today's appointment and denies new or worsening stroke/TIA symptoms.   PLAN:  I had a long d/w patient about his stroke, risk for recurrent stroke/TIAs, personally independently reviewed imaging studies and stroke  evaluation results and answered questions.Continue aspirin 81 mg daily  for secondary stroke prevention and maintain strict control of hypertension with blood pressure goal below 130/90, diabetes with hemoglobin A1c goal below 6.5% and lipids with LDL cholesterol goal below 70 mg/dL. I also advised the patient to eat a healthy diet with plenty of whole grains, cereals, fruits and vegetables, exercise regularly and maintain ideal body weight. Followup in the future with Janett Billow, NP as needed or call with questions or concerns. I spent 25 minutes in total face to face time with the patient more than 50% of which was spent counseling and coordination of care, reviewing test results reviewing medications and discussing and reviewing the diagnosis of stroke and management of risk factors   Venancio Poisson, AGNP-BC [facility] [address] [place], [place] [address] [phone]

## 2017-09-12 ENCOUNTER — Ambulatory Visit (INDEPENDENT_AMBULATORY_CARE_PROVIDER_SITE_OTHER): Payer: Medicare Other | Admitting: Nurse Practitioner

## 2017-09-12 ENCOUNTER — Ambulatory Visit (INDEPENDENT_AMBULATORY_CARE_PROVIDER_SITE_OTHER): Payer: Medicare Other | Admitting: Adult Health

## 2017-09-12 ENCOUNTER — Encounter: Payer: Self-pay | Admitting: Adult Health

## 2017-09-12 ENCOUNTER — Encounter: Payer: Self-pay | Admitting: Nurse Practitioner

## 2017-09-12 ENCOUNTER — Encounter (INDEPENDENT_AMBULATORY_CARE_PROVIDER_SITE_OTHER): Payer: Self-pay

## 2017-09-12 VITALS — BP 132/75 | HR 93 | Wt 144.0 lb

## 2017-09-12 VITALS — BP 134/80 | HR 87 | Temp 98.0°F | Ht 62.0 in | Wt 145.0 lb

## 2017-09-12 DIAGNOSIS — Z8673 Personal history of transient ischemic attack (TIA), and cerebral infarction without residual deficits: Secondary | ICD-10-CM

## 2017-09-12 DIAGNOSIS — J00 Acute nasopharyngitis [common cold]: Secondary | ICD-10-CM

## 2017-09-12 DIAGNOSIS — I10 Essential (primary) hypertension: Secondary | ICD-10-CM | POA: Diagnosis not present

## 2017-09-12 MED ORDER — FLUTICASONE PROPIONATE 50 MCG/ACT NA SUSP: 2.0000 | Freq: Every day | NASAL | 0 refills | Status: DC

## 2017-09-12 MED ORDER — GUAIFENESIN ER 600 MG PO TB12: 600.0000 mg | ORAL_TABLET | Freq: Two times a day (BID) | ORAL | 0 refills | Status: DC | PRN

## 2017-09-12 MED ORDER — BENZONATATE 100 MG PO CAPS: 100.0000 mg | ORAL_CAPSULE | Freq: Three times a day (TID) | ORAL | 0 refills | Status: DC | PRN

## 2017-09-12 NOTE — Progress Notes (Signed)
Subjective:  Patient ID: Jack [name], male    DOB: 09-17-1933  Age: 84 y.o. MRN: [id number]  CC: Cough (coughing,wheezing/2 days. amlodipine dosage consult?)   Cough  This is a new problem. The current episode started yesterday. The problem has been unchanged. The problem occurs constantly. The cough is productive of sputum. Associated symptoms include nasal congestion, postnasal drip and rhinorrhea. Pertinent negatives include no chest pain, chills, fever, headaches, sore throat, shortness of breath or wheezing. The symptoms are aggravated by lying down. Jack [name] has tried OTC cough suppressant for the symptoms. The treatment provided mild relief.   Cardiology: Dr. Wynonia Lawman (private practice) last seen 04/2017. Next appt 10/2017. Jack [name] is confused about amlodipine dose.  Outpatient Medications Prior to Visit  Medication Sig Dispense Refill  . acetaminophen (TYLENOL) 325 MG tablet Take 1-2 tablets (325-650 mg total) by mouth every 4 (four) hours as needed for mild pain.    Marland Kitchen albuterol (PROVENTIL HFA;VENTOLIN HFA) 108 (90 Base) MCG/ACT inhaler Inhale 2 puffs into the lungs every 6 (six) hours as needed for wheezing or shortness of breath. 1 Inhaler 0  . aspirin 325 MG EC tablet Take 1 tablet (325 mg total) by mouth daily. 90 tablet 1  . DOK 100 MG capsule TAKE 1 CAPSULE(100 MG) BY MOUTH TWICE DAILY 180 capsule 3  . hydrochlorothiazide (HYDRODIURIL) 25 MG tablet TAKE 1 TABLET BY MOUTH DAILY 90 tablet 1  . MAGNESIUM PO Take 1 tablet by mouth daily.     Marland Kitchen omeprazole (PRILOSEC) 20 MG capsule Take 1 capsule (20 mg total) by mouth daily. 90 capsule 1  . pravastatin (PRAVACHOL) 40 MG tablet Take 1 tablet (40 mg total) by mouth daily at 6 PM. 90 tablet 1  . amLODipine (NORVASC) 5 MG tablet Take 1 tablet (5 mg total) by mouth at bedtime. 90 tablet 1  . olmesartan (BENICAR) 40 MG tablet Take 1 tablet (40 mg total) by mouth daily. 90 tablet 1   No facility-administered medications prior to visit.      ROS See HPI  Objective:  BP 134/80   Pulse 87   Temp 98 F (36.7 C)   Ht 5\' 2"  (1.575 m)   Wt 145 lb (65.8 kg)   SpO2 97%   BMI 26.52 kg/m   BP Readings from Last 3 Encounters:  09/12/17 134/80  09/12/17 132/75  03/15/17 127/73    Wt Readings from Last 3 Encounters:  09/12/17 145 lb (65.8 kg)  09/12/17 144 lb (65.3 kg)  03/15/17 145 lb 12.8 oz (66.1 kg)    Physical Exam  Constitutional: Jack [name] is oriented to person, place, and time. No distress.  HENT:  Right Ear: Tympanic membrane, external ear and ear canal normal.  Left Ear: Tympanic membrane and ear canal normal.  Nose: Mucosal edema and rhinorrhea present. Right sinus exhibits maxillary sinus tenderness. Right sinus exhibits no frontal sinus tenderness. Left sinus exhibits maxillary sinus tenderness. Left sinus exhibits no frontal sinus tenderness.  Mouth/Throat: Uvula is midline. Posterior oropharyngeal erythema present. No oropharyngeal exudate.  Eyes: No scleral icterus.  Neck: Normal range of motion. Neck supple.  Cardiovascular: Normal rate and regular rhythm.  Pulmonary/Chest: Effort normal and breath sounds normal.  Lymphadenopathy:    Jack [name] has no cervical adenopathy.  Neurological: Jack [name] is alert and oriented to person, place, and time.  Vitals reviewed.  CBC    Component Value Date/Time   WBC 7.2 07/27/2016 2016   RBC 4.58 07/27/2016 2016   HGB 13.3 07/27/2016 2016  HCT 39.7 07/27/2016 2016   PLT 356 07/27/2016 2016   MCV 86.7 07/27/2016 2016   MCH 29.0 07/27/2016 2016   MCHC 33.5 07/27/2016 2016   RDW 14.1 07/27/2016 2016   LYMPHSABS 1.8 07/27/2016 2016   MONOABS 0.8 07/27/2016 2016   EOSABS 0.1 07/27/2016 2016   BASOSABS 0.1 07/27/2016 2016   CMP     Component Value Date/Time   NA 137 03/10/2017 1100   K 3.8 03/10/2017 1100   CL 98 03/10/2017 1100   CO2 33 (H) 03/10/2017 1100   GLUCOSE 99 03/10/2017 1100   BUN 12 03/10/2017 1100   CREATININE 0.85 03/10/2017 1100   CREATININE 0.78  03/03/2015 0001   CALCIUM 10.1 03/10/2017 1100   PROT 6.8 03/10/2017 1100   ALBUMIN 4.2 03/10/2017 1100   AST 17 03/10/2017 1100   ALT 12 03/10/2017 1100   ALKPHOS 40 03/10/2017 1100   BILITOT 0.9 03/10/2017 1100   GFRNONAA >60 07/27/2016 2016   GFRAA >60 07/27/2016 2016    Dg Chest 2 View  Result Date: 07/27/2016 CLINICAL DATA:  SOB and productive cough x 3 days with audible wheezing. - yellow No pain EXAM: CHEST  2 VIEW COMPARISON:  Chest x-ray dated 07/27/2016. FINDINGS: Heart size and mediastinal contours are stable. Median sternotomy wires appear intact and stable in alignment. Valvular hardware appears stable in position. Lungs are clear. No pleural effusion or pneumothorax seen. No acute or suspicious osseous finding. IMPRESSION: No active cardiopulmonary disease. No evidence of pneumonia or pulmonary edema. Electronically Signed   By: Franki Cabot M.D.   On: 07/27/2016 21:02   Dg Chest 2 View  Result Date: 07/27/2016 CLINICAL DATA:  Wheezing and dyspnea EXAM: CHEST  2 VIEW COMPARISON:  03/21/2006 CXR FINDINGS: Stable cardiomegaly with aortic atherosclerosis. The patient is status median sternotomy with cardiac valvular replacement. Atelectasis at each lung base left greater than right. No confluent airspace disease or overt pulmonary edema. Probable small hiatal hernia. No acute osseous abnormality. IMPRESSION: No active cardiopulmonary disease.  Bibasilar atelectasis. Electronically Signed   By: Ashley Royalty M.D.   On: 07/27/2016 18:03   Ct Angio Chest Pe W And/or Wo Contrast  Result Date: 07/28/2016 CLINICAL DATA:  Cough and shortness of breath.  Elevated D-dimer. EXAM: CT ANGIOGRAPHY CHEST WITH CONTRAST TECHNIQUE: Multidetector CT imaging of the chest was performed using the standard protocol during bolus administration of intravenous contrast. Multiplanar CT image reconstructions and MIPs were obtained to evaluate the vascular anatomy. CONTRAST:  100 cc Isovue 370 COMPARISON:   Radiographs earlier this day. FINDINGS: Cardiovascular: There are no filling defects within the pulmonary arteries to suggest pulmonary embolus. Atherosclerosis and tortuosity of the thoracic aorta without evidence of dissection. Post mitral and tricuspid valve replacements. Multi chamber cardiomegaly. There are coronary artery calcifications. Mediastinum/Nodes: No enlarged mediastinal or hilar lymph nodes. Patulous esophagus with small hiatal hernia. Trachea is patent. Questionable linear debris/ secretions within the left lower lobe bronchus, partially obscured by motion. Lungs/Pleura: Narrowing of the left lower lobe bronchus due to compression from the pulmonary artery and adjacent aorta. Scattered atelectasis or scarring in the left lower lobe. No confluent airspace disease. No evidence of pulmonary edema. No pleural fluid. Upper Abdomen: No acute abnormality. Musculoskeletal: Post median sternotomy. There are no acute or suspicious osseous abnormalities. Pseudoarthrosis between lateral right first and second rib, may be sequela of remote injury. Review of the MIP images confirms the above findings. IMPRESSION: 1. No pulmonary embolus. 2. Questionable linear debris/ secretions in  the left lower lobe bronchus, partially obscured by motion artifact. 3. Atherosclerosis including coronary artery calcifications. Prosthetic mitral and tricuspid valves with mild multi chamber cardiomegaly. Electronically Signed   By: Jeb Levering M.D.   On: 07/28/2016 00:29    Assessment & Plan:   Jack [name] was seen today for cough.  Diagnoses and all orders for this visit:  Acute nasopharyngitis -     fluticasone (FLONASE) 50 MCG/ACT nasal spray; Place 2 sprays into both nostrils daily. -     benzonatate (TESSALON) 100 MG capsule; Take 1 capsule (100 mg total) by mouth 3 (three) times daily as needed for cough. -     guaiFENesin (MUCINEX) 600 MG 12 hr tablet; Take 1 tablet (600 mg total) by mouth 2 (two) times daily as  needed for cough or to loosen phlegm.  Benign essential HTN -     amLODipine (NORVASC) 5 MG tablet; Take 1 tablet (5 mg total) by mouth at bedtime.   I am having Jack [name] start on fluticasone, benzonatate, and guaiFENesin. I am also having Jack [name] maintain his acetaminophen, MAGNESIUM PO, albuterol, aspirin, omeprazole, pravastatin, olmesartan, DOK, hydrochlorothiazide, and amLODipine.  Meds ordered this encounter  Medications  . fluticasone (FLONASE) 50 MCG/ACT nasal spray    Sig: Place 2 sprays into both nostrils daily.    Dispense:  16 g    Refill:  0    Order Specific Question:   Supervising Provider    Answer:   Lucille Passy [[id number]]  . benzonatate (TESSALON) 100 MG capsule    Sig: Take 1 capsule (100 mg total) by mouth 3 (three) times daily as needed for cough.    Dispense:  20 capsule    Refill:  0    Order Specific Question:   Supervising Provider    Answer:   Lucille Passy [[id number]]  . guaiFENesin (MUCINEX) 600 MG 12 hr tablet    Sig: Take 1 tablet (600 mg total) by mouth 2 (two) times daily as needed for cough or to loosen phlegm.    Dispense:  14 tablet    Refill:  0    Order Specific Question:   Supervising Provider    Answer:   Lucille Passy [[id number]]  . amLODipine (NORVASC) 5 MG tablet    Sig: Take 1 tablet (5 mg total) by mouth at bedtime.    Dispense:  90 tablet    Refill:  1    Order Specific Question:   Supervising Provider    Answer:   Lucille Passy [[id number]]    Follow-up: Return in about 1 month (around 10/13/2017) for CPE (fasting) and AWV with wellness coach.  Wilfred Lacy, NP

## 2017-09-12 NOTE — Patient Instructions (Addendum)
Left voice message with Dr. Thurman Coyer office to reconcile medication list. Will call you once my call is returned.  Upper Respiratory Infection, Adult Most upper respiratory infections (URIs) are caused by a virus. A URI affects the nose, throat, and upper air passages. The most common type of URI is often called "the common cold." Follow these instructions at home:  Take medicines only as told by your doctor.  Gargle warm saltwater or take cough drops to comfort your throat as told by your doctor.  Use a warm mist humidifier or inhale steam from a shower to increase air moisture. This may make it easier to breathe.  Drink enough fluid to keep your pee (urine) clear or pale yellow.  Eat soups and other clear broths.  Have a healthy diet.  Rest as needed.  Go back to work when your fever is gone or your doctor says it is okay. ? You may need to stay home longer to avoid giving your URI to others. ? You can also wear a face mask and wash your hands often to prevent spread of the virus.  Use your inhaler more if you have asthma.  Do not use any tobacco products, including cigarettes, chewing tobacco, or electronic cigarettes. If you need help quitting, ask your doctor. Contact a doctor if:  You are getting worse, not better.  Your symptoms are not helped by medicine.  You have chills.  You are getting more short of breath.  You have brown or red mucus.  You have yellow or brown discharge from your nose.  You have pain in your face, especially when you bend forward.  You have a fever.  You have puffy (swollen) neck glands.  You have pain while swallowing.  You have white areas in the back of your throat. Get help right away if:  You have very bad or constant: ? Headache. ? Ear pain. ? Pain in your forehead, behind your eyes, and over your cheekbones (sinus pain). ? Chest pain.  You have long-lasting (chronic) lung disease and any of the  following: ? Wheezing. ? Long-lasting cough. ? Coughing up blood. ? A change in your usual mucus.  You have a stiff neck.  You have changes in your: ? Vision. ? Hearing. ? Thinking. ? Mood. This information is not intended to replace advice given to you by your health care provider. Make sure you discuss any questions you have with your health care provider. Document Released: 12/15/2007 Document Revised: 02/29/2016 Document Reviewed: 10/03/2013 Elsevier Interactive Patient Education  2018 [employer].

## 2017-09-12 NOTE — Patient Instructions (Addendum)
Continue aspirin 81 mg daily and pravachol for secondary stroke prevention  maintain strict control of hypertension with blood pressure goal below 130/90, diabetes with hemoglobin A1c goal below 6.5% and lipids with LDL cholesterol goal below 70 mg/dL. I also advised the patient to eat a healthy diet with plenty of whole grains, cereals, fruits and vegetables, exercise regularly and maintain ideal body weight   Continue to work on home physical and occupational exercercises   Followup in the future with Janett Billow, NP as needed or call earlier if with new concerns

## 2017-09-13 ENCOUNTER — Telehealth: Payer: Self-pay | Admitting: Nurse Practitioner

## 2017-09-13 ENCOUNTER — Encounter: Payer: Self-pay | Admitting: Nurse Practitioner

## 2017-09-13 DIAGNOSIS — E785 Hyperlipidemia, unspecified: Secondary | ICD-10-CM

## 2017-09-13 DIAGNOSIS — K5900 Constipation, unspecified: Secondary | ICD-10-CM

## 2017-09-13 DIAGNOSIS — I10 Essential (primary) hypertension: Secondary | ICD-10-CM

## 2017-09-13 MED ORDER — OLMESARTAN MEDOXOMIL 40 MG PO TABS: 40.0000 mg | ORAL_TABLET | Freq: Every day | ORAL | 1 refills | Status: DC

## 2017-09-13 MED ORDER — AMLODIPINE BESYLATE 5 MG PO TABS: 5.0000 mg | ORAL_TABLET | Freq: Every day | ORAL | 1 refills | Status: DC

## 2017-09-13 MED ORDER — DOCUSATE SODIUM 100 MG PO CAPS: 100.0000 mg | ORAL_CAPSULE | Freq: Two times a day (BID) | ORAL | 3 refills | Status: DC | PRN

## 2017-09-13 NOTE — Telephone Encounter (Signed)
Spoke with Rivanna, went over med list with her. She will forward the last ov to Nche. Meds added.

## 2017-09-13 NOTE — Telephone Encounter (Signed)
Pt is aware and will call back and make an appt.

## 2017-09-13 NOTE — Progress Notes (Signed)
I agree with the above plan 

## 2017-09-13 NOTE — Telephone Encounter (Signed)
Left vm for Brandy to call back, need to go over medications list with her and need to know which medication Dr. Wynonia Lawman will be managing for BP.      Copied from [place] [id number]. Topic: Quick Communication - Office Called Patient >> Sep 13, 2017  9:35 AM Yvette Rack wrote: Reason for CRM: Theadora Rama from Dr Ezekiel Slocumb office [phone] calling stating that Nche called the Dr Wynonia Lawman to reconcile pt med list please give Dr Wynonia Lawman or his Assistant Theadora Rama a call back at [phone]

## 2018-03-31 ENCOUNTER — Other Ambulatory Visit: Payer: Self-pay | Admitting: Nurse Practitioner

## 2018-03-31 DIAGNOSIS — I10 Essential (primary) hypertension: Secondary | ICD-10-CM

## 2018-05-02 ENCOUNTER — Other Ambulatory Visit: Payer: Self-pay

## 2018-05-02 ENCOUNTER — Telehealth: Payer: Self-pay | Admitting: Cardiology

## 2018-05-02 MED ORDER — SILDENAFIL CITRATE 100 MG PO TABS: 50.0000 mg | ORAL_TABLET | Freq: Every day | ORAL | 0 refills | Status: DC | PRN

## 2018-05-02 NOTE — Telephone Encounter (Signed)
Med refill has been sent. 

## 2018-05-02 NOTE — Telephone Encounter (Signed)
° ° ° °  1. Which medications need to be refilled? (please list name of each medication and dose if known) viagra 100mg   2. Which pharmacy/location (including street and city if local pharmacy) is medication to be sent to? [facility] on [address]  3. Do they need a 30 day or 90 day supply? [place]

## 2018-05-05 ENCOUNTER — Encounter: Payer: Self-pay | Admitting: Cardiology

## 2018-05-05 ENCOUNTER — Ambulatory Visit (INDEPENDENT_AMBULATORY_CARE_PROVIDER_SITE_OTHER): Payer: Medicare Other | Admitting: Cardiology

## 2018-05-05 VITALS — BP 136/80 | HR 87 | Ht 62.0 in | Wt 142.1 lb

## 2018-05-05 DIAGNOSIS — Z9889 Other specified postprocedural states: Secondary | ICD-10-CM

## 2018-05-05 DIAGNOSIS — I480 Paroxysmal atrial fibrillation: Secondary | ICD-10-CM

## 2018-05-05 DIAGNOSIS — I10 Essential (primary) hypertension: Secondary | ICD-10-CM

## 2018-05-05 DIAGNOSIS — I610 Nontraumatic intracerebral hemorrhage in hemisphere, subcortical: Secondary | ICD-10-CM

## 2018-05-05 DIAGNOSIS — Z951 Presence of aortocoronary bypass graft: Secondary | ICD-10-CM

## 2018-05-05 NOTE — Progress Notes (Signed)
Cardiology Office Note:    Date:  05/05/2018   ID:  Jack [name], DOB September 06, 1933, MRN [id number]  PCP:  Flossie Buffy, NP  Cardiologist:  Jenne Campus, MD    Referring MD: Flossie Buffy, NP   Chief Complaint  Patient presents with  . Follow-up  Doing well  History of Present Illness:    Jack [name] is a 84 y.o. male with multiple medical problems that include history of CVA, also coronary artery bypass graft, mitral valve repair.  He also does have history of hypertension and hypertension which is related to mitral valve regurgitation, dyslipidemia.  Comes today to my office with his wife overall he does not talk much majority of history of get from his wife he however enjoy his great-grandchildren that he elected play with at least once a week denies have any chest pain tightness squeezing pressure burning chest he tells me that his legs much better in terms of swelling.  Past Medical History:  Diagnosis Date  . Atrial fibrillation ([place])    Dr. Tollie Eth  . BPH (benign prostatic hypertrophy) 2012   prior therapy with finasteride, but as of 03/2015 no c/o; Dr. Luanne Bras  . Cellulitis of face 11/08   [facility]  . CVA (cerebral infarction) 2009, 2015  . Echocardiogram abnormal 02/11/2010   LVEF 70%, severe mitral regurgitation, pulm HTN   . ED (erectile dysfunction)    Viagra prn, consult with Urology 2012  . Elevated PSA 2012   BPH; Dr. Luanne Bras, [facility]; no more PSAs needed as of 2012 consult  . Gait abnormality    uses cane, s/p CVA  . Hemiparesis affecting left side as late effect of cerebrovascular accident ([place])   . Hiatal hernia    per 2008 EUS  . Hyperlipidemia   . Hypertension   . Microscopic hematuria    chronic, eval 2012 with Urology, benign at that time  . Obstructive uropathy 2012   Dr. Luanne Bras  . Pancreatic mass 01/2006   inconsequential per CT 2007  . 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 Patent foramen ovale    hx/o, notation of fistula on echocardiogram from 2009  . Prophylactic antibiotic 2016   requires due to valve disease  . Schatzki's ring 2008   per endoscopic ultrasound; Dr. Janetta Hora. [name]  . Stroke ([place])   . Ventral hernia    surgery consult 01/2010 with Dr. Excell Seltzer, elective repair if desired , but he declined  . Wears hearing aid     Past Surgical History:  Procedure Laterality Date  . CARDIAC CATHETERIZATION  02/2010   LVEF 70%, severe mitral regurgitation, pulm HTN; prior in 01/2006  . INGUINAL HERNIA REPAIR     bilat  . MITRAL VALVE REPAIR  01/2006   mitral repair with Maze procedure, tricuspid annuloplasty, MAZE and LAA oversewn by Dr. Roxy Manns    Current Medications: Current Meds  Medication Sig  . amoxicillin (AMOXIL) 500 MG capsule 500 mg cap, take 4 cap 1 hour before dental work or surgery  . MAGNESIUM PO Take 1 tablet by mouth daily.   . Multiple Vitamins-Minerals (MULTIVITAMIN) tablet Take 1 tablet by mouth daily. Centrum Men 8 mg iron 200 mcg-600 mcg tablet  . olmesartan (BENICAR) 40 MG tablet Take 1 tablet (40 mg total) by mouth daily.  Marland Kitchen omeprazole (PRILOSEC) 20 MG capsule Take 1 capsule (20 mg total) by mouth daily.  Marland Kitchen  pravastatin (PRAVACHOL) 40 MG tablet Take 1 tablet (40 mg total) by mouth daily at 6 PM.  . sildenafil (VIAGRA) 100 MG tablet Take 0.5-1 tablets (50-100 mg total) by mouth daily as needed for erectile dysfunction.  . [DISCONTINUED] hydrochlorothiazide (HYDRODIURIL) 25 MG tablet Take 25 mg by mouth daily.     Allergies:   Patient has no known allergies.   Social History   Socioeconomic History  . Marital status: Married    Spouse name: [name]  . Number of children: Not on file  . Years of education: Not on file  . Highest education level: Not on file  Occupational History  . Not on file  Social Needs  . Financial resource strain: Not on file  . Food  insecurity:    Worry: Not on file    Inability: Not on file  . Transportation needs:    Medical: Not on file    Non-medical: Not on file  Tobacco Use  . Smoking status: Never Smoker  . Smokeless tobacco: Never Used  Substance and Sexual Activity  . Alcohol use: No  . Drug use: No  . Sexual activity: Not on file  Lifestyle  . Physical activity:    Days per week: Not on file    Minutes per session: Not on file  . Stress: Not on file  Relationships  . Social connections:    Talks on phone: Not on file    Gets together: Not on file    Attends religious service: Not on file    Active member of club or organization: Not on file    Attends meetings of clubs or organizations: Not on file    Relationship status: Not on file  Other Topics Concern  . Not on file  Social History Narrative   Married, exercises with stationary bike 5 days per week.   Eats healthy.  As of 03/2015     Family History: The patient's Family history is unknown by patient. ROS:   Please see the history of present illness.    All 14 point review of systems negative except as described per history of present illness  EKGs/Labs/Other Studies Reviewed:      Recent Labs: No results found for requested labs within last 8760 hours.  Recent Lipid Panel    Component Value Date/Time   CHOL 151 03/10/2017 1100   TRIG 143.0 03/10/2017 1100   HDL 30.40 (L) 03/10/2017 1100   CHOLHDL 5 03/10/2017 1100   VLDL 28.6 03/10/2017 1100   LDLCALC 92 03/10/2017 1100    Physical Exam:    VS:  BP 136/80   Pulse 87   Ht 5\' 2"  (1.575 m)   Wt 142 lb 1.9 oz (64.5 kg)   SpO2 93%   BMI 25.99 kg/m     Wt Readings from Last 3 Encounters:  05/05/18 142 lb 1.9 oz (64.5 kg)  09/12/17 145 lb (65.8 kg)  09/12/17 144 lb (65.3 kg)     GEN:  Well nourished, well developed in no acute distress HEENT: Normal NECK: No JVD; No carotid bruits LYMPHATICS: No lymphadenopathy CARDIAC: RRR, no murmurs, no rubs, no  gallops RESPIRATORY:  Clear to auscultation without rales, wheezing or rhonchi  ABDOMEN: Soft, non-tender, non-distended MUSCULOSKELETAL:  No edema; No deformity  SKIN: Warm and dry LOWER EXTREMITIES: no swelling NEUROLOGIC:  Alert and oriented x 3 PSYCHIATRIC:  Normal affect   ASSESSMENT:    1. Paroxysmal atrial fibrillation (HCC)   2. Nontraumatic subcortical hemorrhage of  cerebral hemisphere, unspecified laterality ([place])   3. Essential hypertension   4. Status post mitral valve repair   5. Status post coronary artery bypass graft    PLAN:    In order of problems listed above:  1. Paroxysmal atrial fibrillation denies having any palpitations.  He is not anticoagulated because of history of intracranial bleeding that he had. 2. Essential hypertension blood pressure appears to be well controlled we will continue present management. 3. Status post coronary artery bypass grafting well from that point of view.  On appropriate medication I will continue asymptomatic 4. Status post mitral valve repair.  Hemodynamically stable honestly he does not do much seems to be doing well overall.   Medication Adjustments/Labs and Tests Ordered: Current medicines are reviewed at length with the patient today.  Concerns regarding medicines are outlined above.  No orders of the defined types were placed in this encounter.  Medication changes: No orders of the defined types were placed in this encounter.   Signed, Park Liter, MD, [facility] 05/05/2018 12:17 PM    [place]

## 2018-05-05 NOTE — Patient Instructions (Signed)

## 2018-10-29 IMAGING — CT CT HEAD W/O CM
3 series · 15 of 47 positions shown, 18 images · non-contrast
Comparison: 08/17/2013

CLINICAL DATA: RIGHT-side weakness for several days worse since
this morning, history hypertension, stroke, atrial fibrillation, PFO

EXAM:
CT HEAD WITHOUT CONTRAST
TECHNIQUE: Contiguous axial images were obtained from the base of the skull
through the vertex without intravenous contrast.

[Series 2: head wo · axial · 0.43mm/px · z∈[+658,+788]mm · 9 of 32 slices shown, 12 images]
[im 3/32  brain]
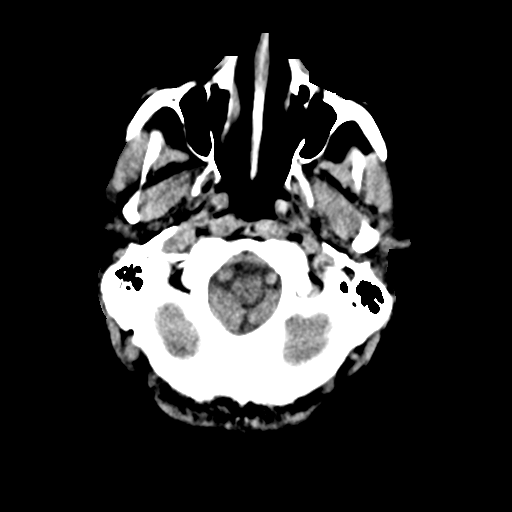
[im 3/32  bone]
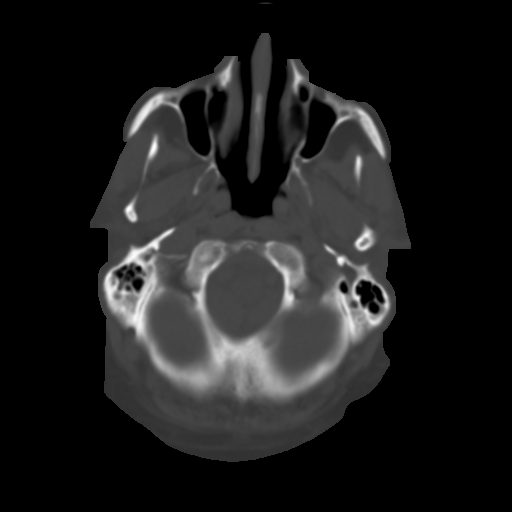
[im 6/32  brain]
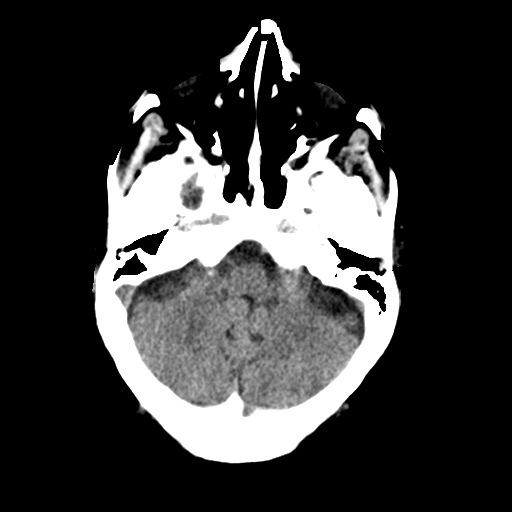
[im 9/32  brain]
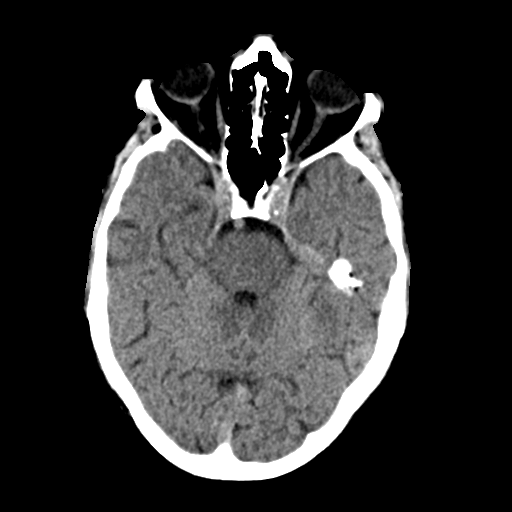
[im 12/32  brain]
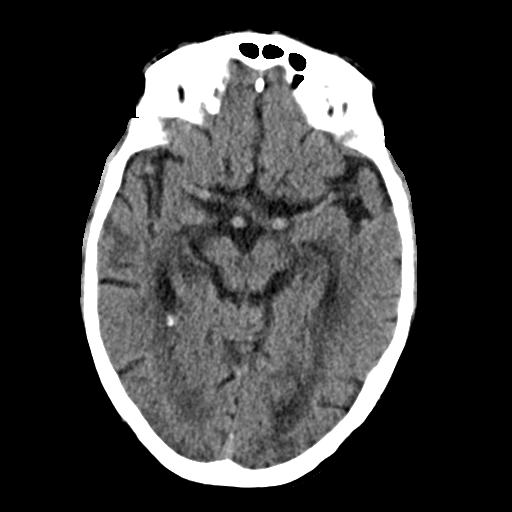
[im 17/32  brain]
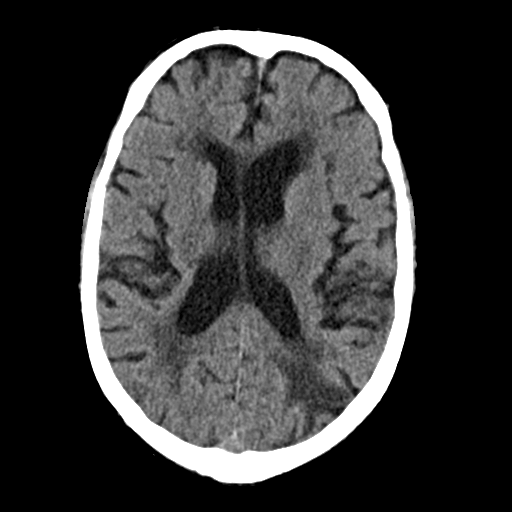
[im 17/32  bone]
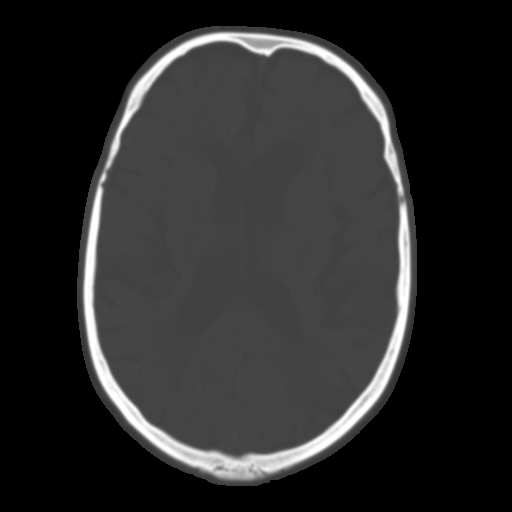
[im 20/32  brain]
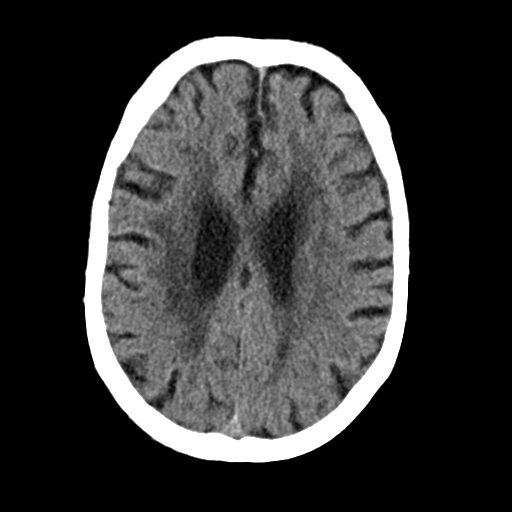
[im 23/32  brain]
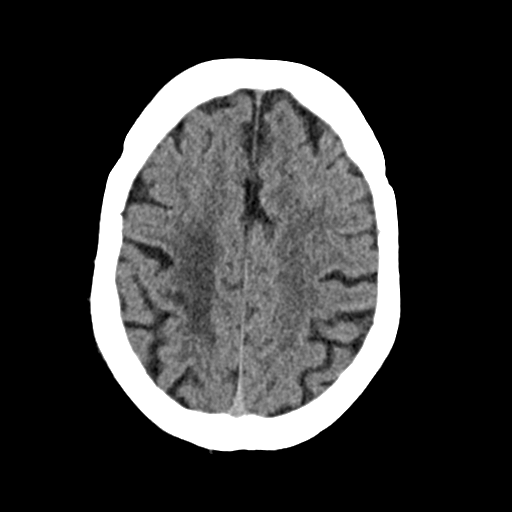
[im 26/32  brain]
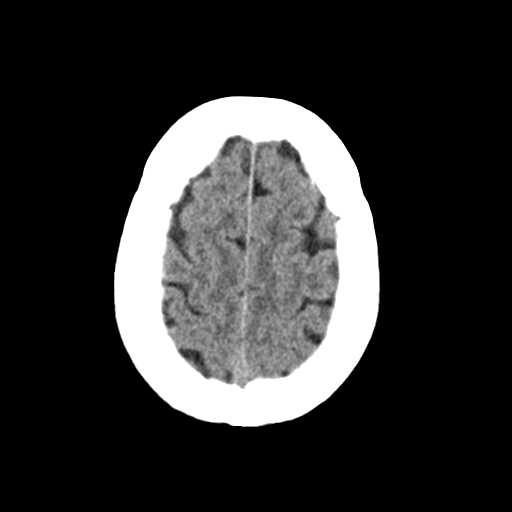
[im 29/32  brain]
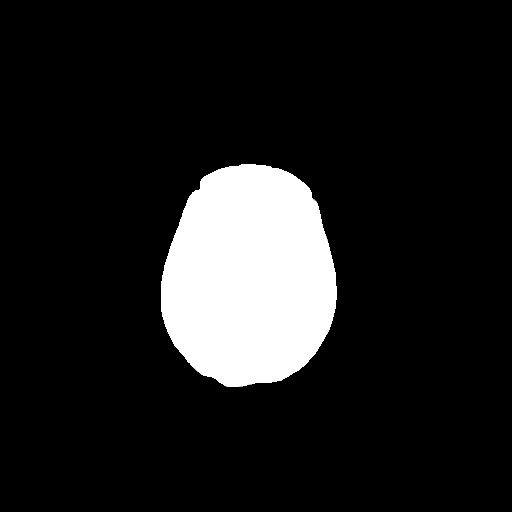
[im 29/32  bone]
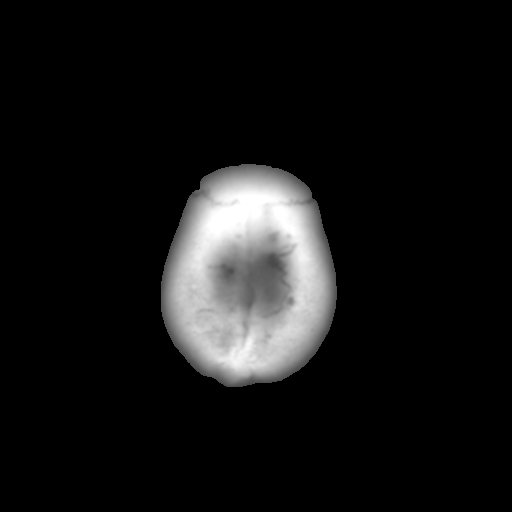

[Series 4: coronal soft · coronal · 0.31mm/px · 3 of 69 slices shown]
[im 23/69  brain]
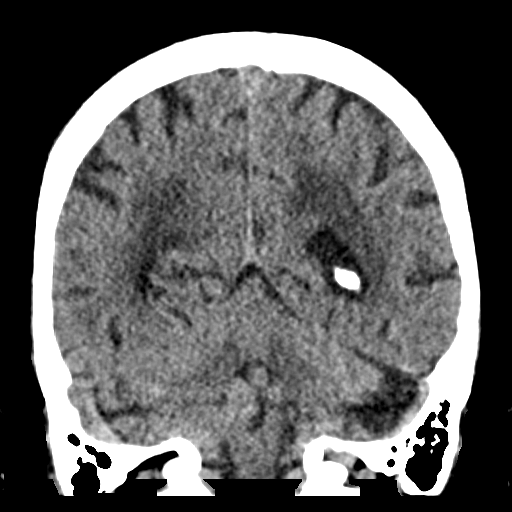
[im 31/69  brain]
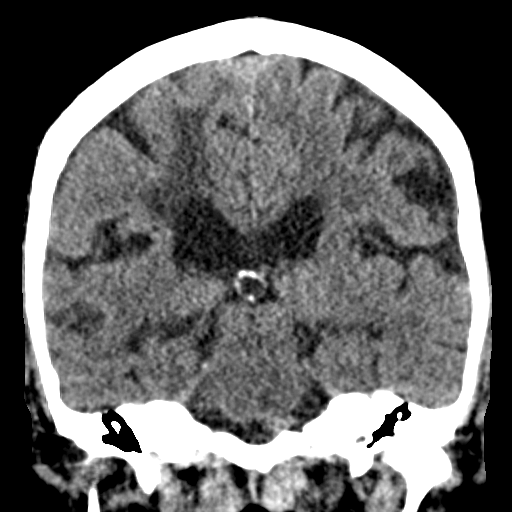
[im 38/69  brain]
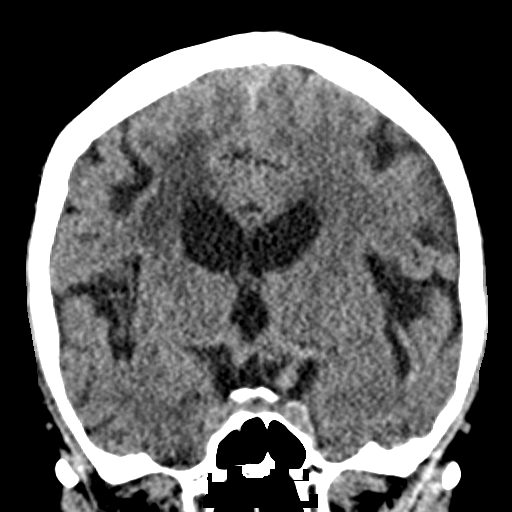

[Series 5: sag soft · sagittal · 0.31mm/px · 3 of 53 slices shown]
[im 18/53  brain]
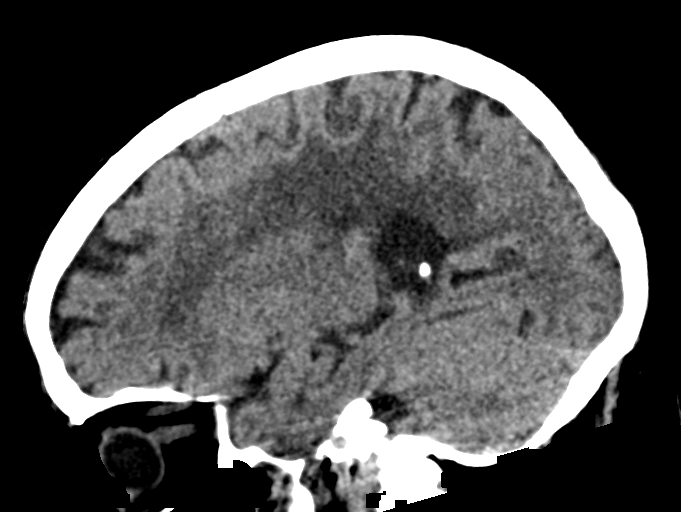
[im 27/53  brain]
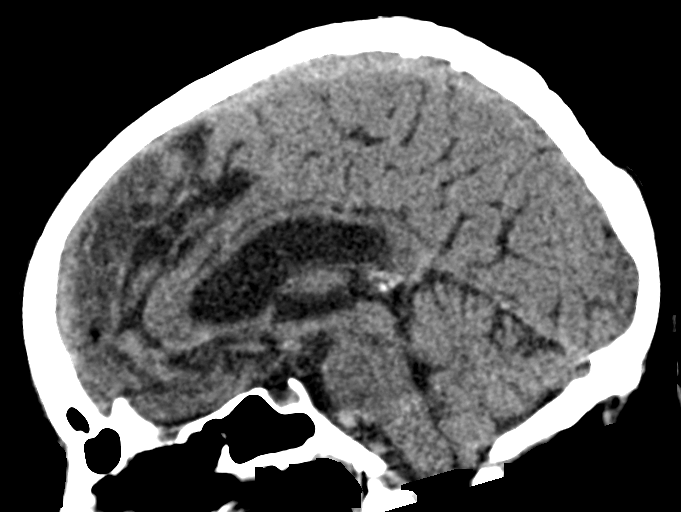
[im 35/53  brain]
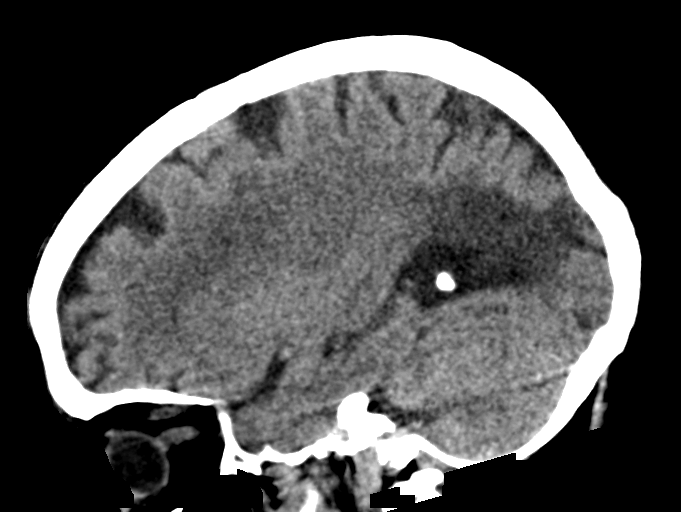

[15 of 47 positions shown; findings below may reference images not displayed]

FINDINGS: Brain: New generalized atrophy. Normal ventricular morphology. No
midline shift or mass effect. Small vessel chronic ischemic changes
of deep cerebral white matter. Small old lacunar infarcts at the
thalami. Question small old lacunar infarct at the RIGHT cerebellar
hemisphere. Old RIGHT parieto-occipital infarct. No intracranial
hemorrhage, mass lesion or evidence of acute infarction. No
extra-axial fluid collections.

Vascular: Scattered atherosclerotic calcifications

Skull: Intact

Sinuses/Orbits: Cleared

Other: N/A
IMPRESSION: Atrophy with small vessel chronic ischemic changes of deep cerebral
white matter.

Multiple old infarcts as above.

No acute intracranial abnormalities.

## 2018-10-31 ENCOUNTER — Telehealth: Payer: Self-pay | Admitting: Nurse Practitioner

## 2018-10-31 NOTE — Telephone Encounter (Signed)
Called and left vm for patient. Calling to schedule virtual visit with [name] or CPE in July 2020

## 2018-11-22 IMAGING — DX DG CHEST 2V
2 series · 2 of 2 positions shown · non-contrast
Comparison: 03/21/2006 CXR

CLINICAL DATA: Wheezing and dyspnea

EXAM:
CHEST  2 VIEW

[chest pa]
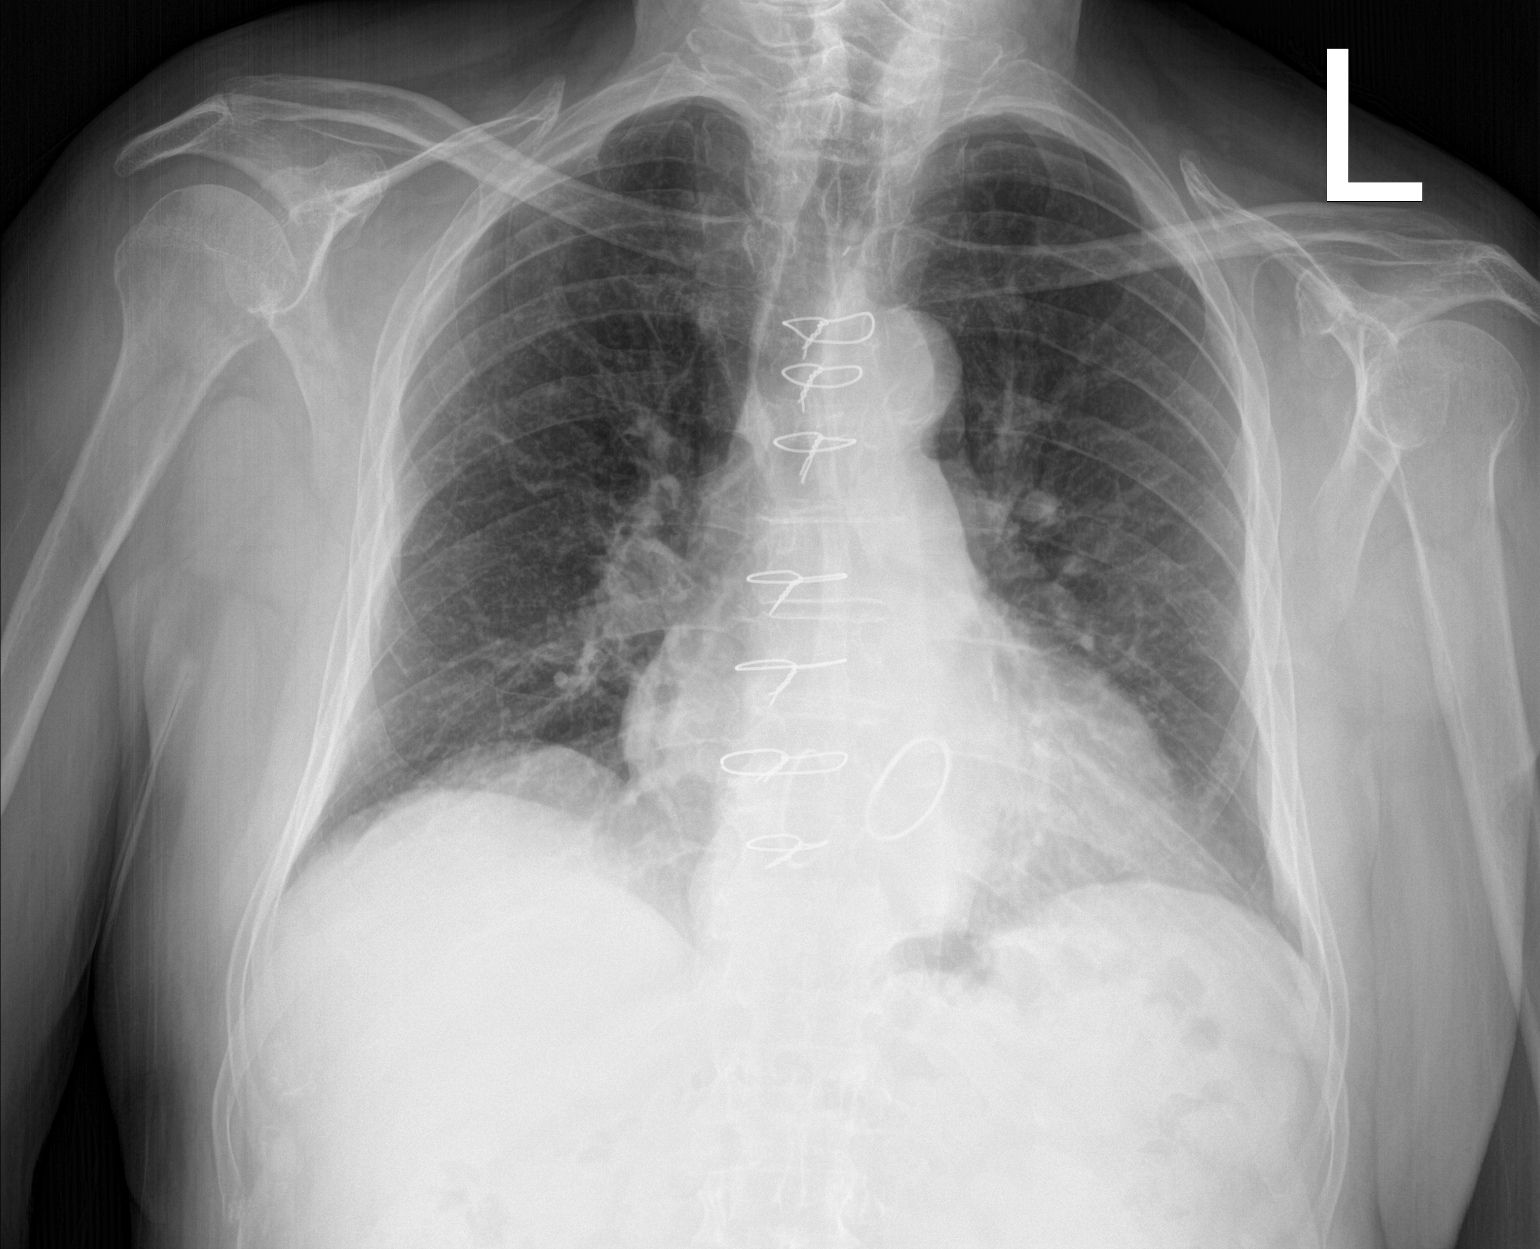

[chest lat]
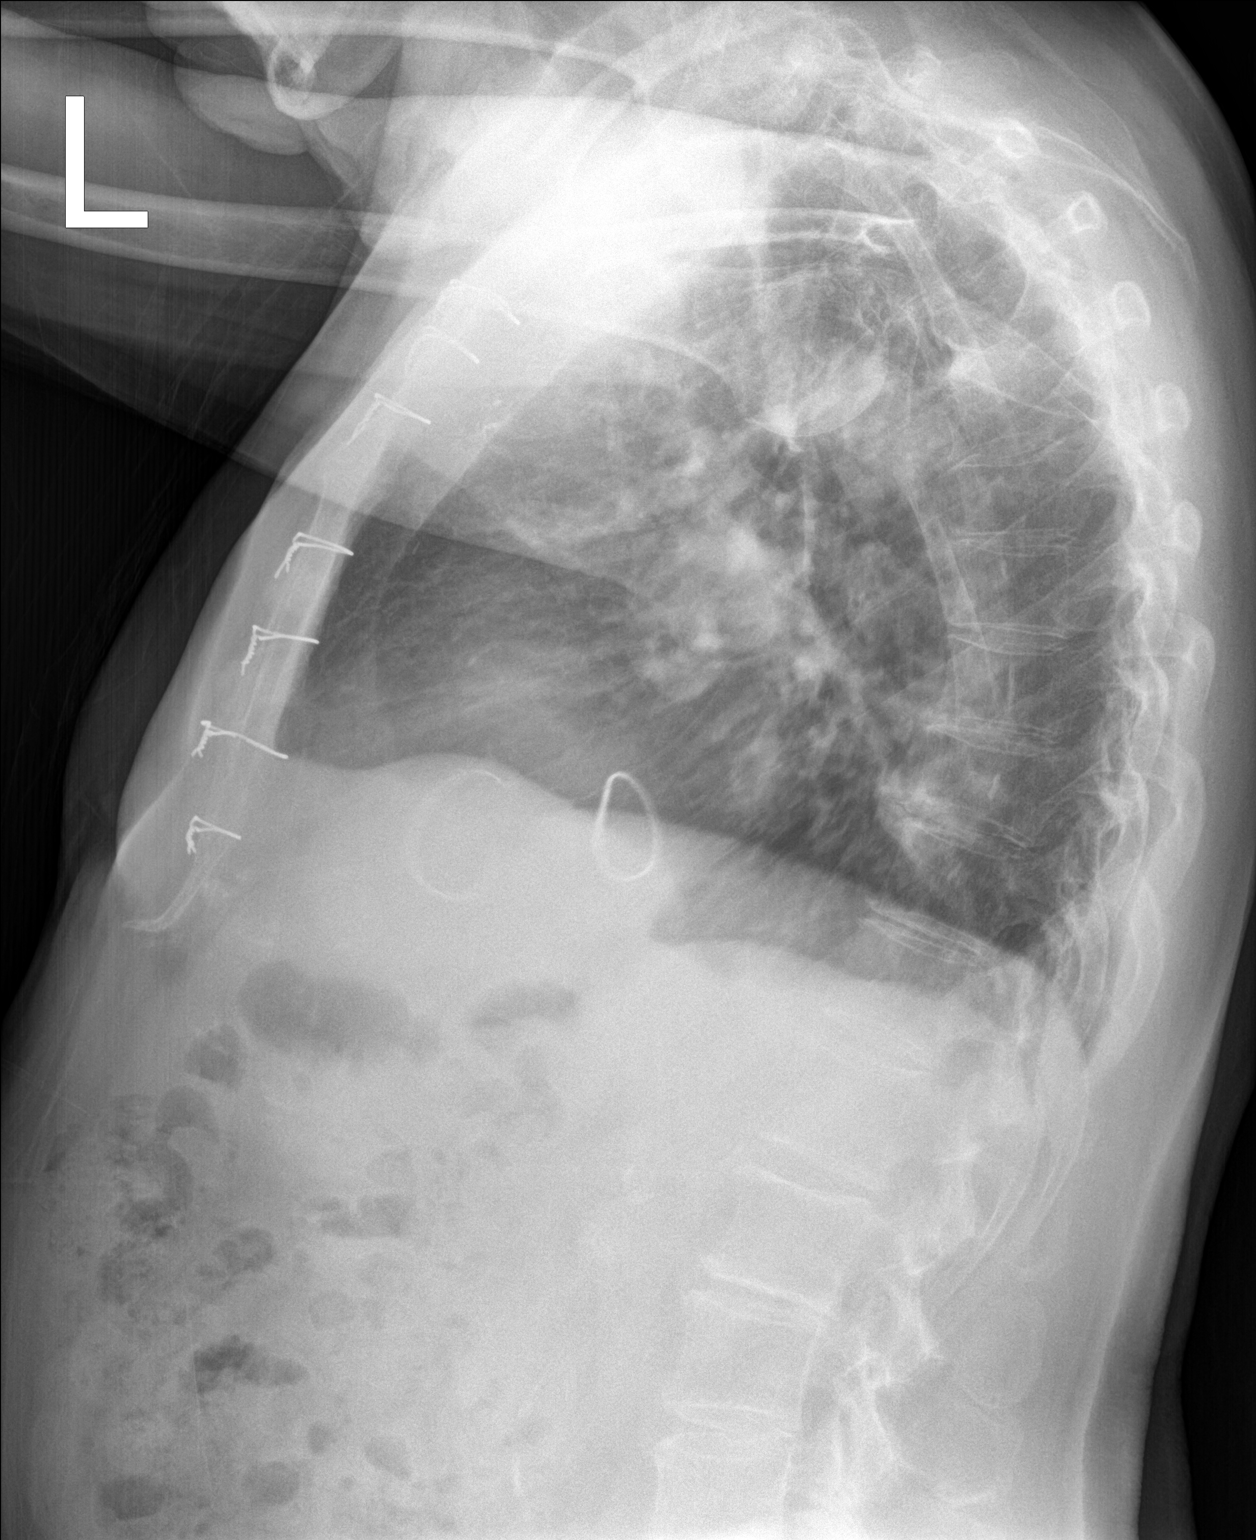

[2 of 2 positions shown; findings below may reference images not displayed]

FINDINGS: Stable cardiomegaly with aortic atherosclerosis. The patient is
status median sternotomy with cardiac valvular replacement.
Atelectasis at each lung base left greater than right. No confluent
airspace disease or overt pulmonary edema. Probable small hiatal
hernia. No acute osseous abnormality.
IMPRESSION: No active cardiopulmonary disease.  Bibasilar atelectasis.

## 2018-12-27 ENCOUNTER — Emergency Department (HOSPITAL_COMMUNITY)
Admission: EM | Admit: 2018-12-27 | Discharge: 2018-12-27 | Disposition: A | Discharge status: Home / Self Care | Payer: Medicare Other | Attending: Emergency Medicine | Admitting: Emergency Medicine

## 2018-12-27 ENCOUNTER — Telehealth: Payer: Self-pay | Admitting: Neurology

## 2018-12-27 ENCOUNTER — Emergency Department (HOSPITAL_COMMUNITY): Payer: Medicare Other

## 2018-12-27 ENCOUNTER — Other Ambulatory Visit: Payer: Self-pay

## 2018-12-27 DIAGNOSIS — S4992XA Unspecified injury of left shoulder and upper arm, initial encounter: Secondary | ICD-10-CM | POA: Diagnosis present

## 2018-12-27 DIAGNOSIS — I4821 Permanent atrial fibrillation: Secondary | ICD-10-CM | POA: Insufficient documentation

## 2018-12-27 DIAGNOSIS — Y998 Other external cause status: Secondary | ICD-10-CM | POA: Diagnosis not present

## 2018-12-27 DIAGNOSIS — Z8673 Personal history of transient ischemic attack (TIA), and cerebral infarction without residual deficits: Secondary | ICD-10-CM | POA: Diagnosis not present

## 2018-12-27 DIAGNOSIS — Z79899 Other long term (current) drug therapy: Secondary | ICD-10-CM | POA: Diagnosis not present

## 2018-12-27 DIAGNOSIS — I10 Essential (primary) hypertension: Secondary | ICD-10-CM | POA: Insufficient documentation

## 2018-12-27 DIAGNOSIS — W19XXXA Unspecified fall, initial encounter: Secondary | ICD-10-CM | POA: Diagnosis not present

## 2018-12-27 DIAGNOSIS — R4182 Altered mental status, unspecified: Secondary | ICD-10-CM | POA: Insufficient documentation

## 2018-12-27 DIAGNOSIS — Y929 Unspecified place or not applicable: Secondary | ICD-10-CM | POA: Diagnosis not present

## 2018-12-27 DIAGNOSIS — Y939 Activity, unspecified: Secondary | ICD-10-CM | POA: Diagnosis not present

## 2018-12-27 DIAGNOSIS — S46912A Strain of unspecified muscle, fascia and tendon at shoulder and upper arm level, left arm, initial encounter: Secondary | ICD-10-CM | POA: Diagnosis not present

## 2018-12-27 LAB — CBC WITH DIFFERENTIAL/PLATELET
Abs Immature Granulocytes: 0.04 10*3/uL (ref 0.00–0.07)
Basophils Absolute: 0 10*3/uL (ref 0.0–0.1)
Basophils Relative: 0 %
Eosinophils Absolute: 0 10*3/uL (ref 0.0–0.5)
Eosinophils Relative: 0 %
HCT: 39 % (ref 39.0–52.0)
Hemoglobin: 12.9 g/dL — ABNORMAL LOW (ref 13.0–17.0)
Immature Granulocytes: 0 %
Lymphocytes Relative: 7 %
Lymphs Abs: 0.8 10*3/uL (ref 0.7–4.0)
MCH: 29.8 pg (ref 26.0–34.0)
MCHC: 33.1 g/dL (ref 30.0–36.0)
MCV: 90.1 fL (ref 80.0–100.0)
Monocytes Absolute: 1 10*3/uL (ref 0.1–1.0)
Monocytes Relative: 9 %
Neutro Abs: 9.7 10*3/uL — ABNORMAL HIGH (ref 1.7–7.7)
Neutrophils Relative %: 84 %
Platelets: 267 10*3/uL (ref 150–400)
RBC: 4.33 MIL/uL (ref 4.22–5.81)
RDW: 14.6 % (ref 11.5–15.5)
WBC: 11.6 10*3/uL — ABNORMAL HIGH (ref 4.0–10.5)
nRBC: 0 % (ref 0.0–0.2)

## 2018-12-27 LAB — COMPREHENSIVE METABOLIC PANEL
ALT: 14 U/L (ref 0–44)
AST: 19 U/L (ref 15–41)
Albumin: 3.7 g/dL (ref 3.5–5.0)
Alkaline Phosphatase: 36 U/L — ABNORMAL LOW (ref 38–126)
Anion gap: 11 (ref 5–15)
BUN: 11 mg/dL (ref 8–23)
CO2: 25 mmol/L (ref 22–32)
Calcium: 8.8 mg/dL — ABNORMAL LOW (ref 8.9–10.3)
Chloride: 102 mmol/L (ref 98–111)
Creatinine, Ser: 0.75 mg/dL (ref 0.61–1.24)
GFR calc Af Amer: >60 mL/min (ref >60)
GFR calc non Af Amer: >60 mL/min (ref >60)
Glucose, Bld: 115 mg/dL — ABNORMAL HIGH (ref 70–99)
Potassium: 3.5 mmol/L (ref 3.5–5.1)
Sodium: 138 mmol/L (ref 135–145)
Total Bilirubin: 1.3 mg/dL — ABNORMAL HIGH (ref 0.3–1.2)
Total Protein: 7 g/dL (ref 6.5–8.1)

## 2018-12-27 LAB — URINALYSIS, ROUTINE W REFLEX MICROSCOPIC
Bacteria, UA: NONE SEEN
Bilirubin Urine: NEGATIVE
Glucose, UA: NEGATIVE mg/dL
Ketones, ur: 20 mg/dL — AB
Leukocytes,Ua: NEGATIVE
Nitrite: NEGATIVE
Protein, ur: 100 mg/dL — AB
Specific Gravity, Urine: 1.017 (ref 1.005–1.030)
pH: 6 (ref 5.0–8.0)

## 2018-12-27 LAB — CBG MONITORING, ED: Glucose-Capillary: 102 mg/dL — ABNORMAL HIGH (ref 70–99)

## 2018-12-27 LAB — PROTIME-INR
INR: 1.2 (ref 0.8–1.2)
Prothrombin Time: 15.3 seconds — ABNORMAL HIGH (ref 11.4–15.2)

## 2018-12-27 NOTE — ED Notes (Signed)
Patient verbalizes understanding of discharge instructions. Opportunity for questioning and answers were provided. Armband removed by staff, pt discharged from ED.  

## 2018-12-27 NOTE — Progress Notes (Signed)
Orthopedic Tech Progress Note Patient Details:  Jack [name] June 10, 1934 [id number] Assisted patient with getting dressed and applied the shoulder immobilizer to him Ortho Devices Type of Ortho Device: Sling immobilizer Ortho Device/Splint Location: ULE Ortho Device/Splint Interventions: Adjustment, Application, Ordered   Post Interventions Patient Tolerated: Well Instructions Provided: Care of device, Adjustment of device   Janit Pagan 12/27/2018, 2:03 PM

## 2018-12-27 NOTE — Telephone Encounter (Signed)
Pt gave consent. Pt understands that although there may be some limitations with this type of visit, we will take all precautions to reduce any security or privacy concerns.  Pt understands that this will be treated like an in office visit and we will file with pt's insurance, and there may be a patient responsible charge related to this service.

## 2018-12-27 NOTE — ED Provider Notes (Signed)
Jack [name] Provider Note   CSN: [id number] Arrival date & time: 12/27/18  1007    History   Chief Complaint Chief Complaint  Patient presents with  . Altered Mental Status    HPI Jack [name] is a 85 y.o. male.     Pt presents to the ED today with AMS.  The pt's wife gives most of the hx.  She said he's had a several week hx of worsening mental status.  She said he's been getting confused on the most basic of tasks, like going up the stairs and how to get around the house.  Yesterday, he fell and his left shoulder has been hurting.  He does have a hx of afib, but is not on blood thinners due to prior ICH in 2015.  CHA2DS2/VAS Stroke Risk Points  Current as of 4 days ago (Saturday)     5 >= 2 Points: High Risk  1 - 1.99 Points: Medium Risk  0 Points: Low Risk    This is the only CHA2DS2/VAS Stroke Risk Points available for the past  year.: Last Change: N/A     Details    This score determines the patient's risk of having a stroke if the  patient has atrial fibrillation.       Points Metrics  0 Has Congestive Heart Failure:  No    Current as of 4 days ago (Saturday)  0 Has Vascular Disease:  No    Current as of 4 days ago (Saturday)  1 Has Hypertension:  Yes    Current as of 4 days ago (Saturday)  2 Age:  40    Current as of 4 days ago (Saturday)  0 Has Diabetes:  No    Current as of 4 days ago (Saturday)  2 Had Stroke:  Yes  Had TIA:  Yes  Had thromboembolism:  No    Current as of 4 days ago (Saturday)  0 Male:  No    Current as of 4 days ago (Saturday)              Past Medical History:  Diagnosis Date  . Atrial fibrillation ([place])    Dr. Tollie Eth  . BPH (benign prostatic hypertrophy) 2012   prior therapy with finasteride, but as of 03/2015 no c/o; Dr. Luanne Bras  . Cellulitis of face 11/08   [facility]NT  . CVA (cerebral infarction) 2009, 2015  . Echocardiogram abnormal 02/11/2010   LVEF 70%,  severe mitral regurgitation, pulm HTN   . ED (erectile dysfunction)    Viagra prn, consult with Urology 2012  . Elevated PSA 2012   BPH; Dr. Luanne Bras, [facility]; no more PSAs needed as of 2012 consult  . Gait abnormality    uses cane, s/p CVA  . Hemiparesis affecting left side as late effect of cerebrovascular accident ([place])   . Hiatal hernia    per 2008 EUS  . Hyperlipidemia   . Hypertension   . Microscopic hematuria    chronic, eval 2012 with Urology, benign at that time  . Obstructive uropathy 2012   Dr. Luanne Bras  . Pancreatic mass 01/2006   inconsequential per CT 2007  . 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 Patent foramen ovale    hx/o, notation of fistula on echocardiogram from 2009  . Prophylactic antibiotic 2016   requires due to valve disease  . Schatzki's ring 2008   per endoscopic  ultrasound; Dr. Janetta Hora. Jack [name]  . Stroke ([place])   . Ventral hernia    surgery consult 01/2010 with Dr. Excell Seltzer, elective repair if desired , but he declined  . Wears hearing aid     Patient Active Problem List   Diagnosis Date Noted  . Status post coronary artery bypass graft 05/05/2018  . Hyperglycemia 03/10/2017  . Elevated PSA, less than 10 ng/ml 03/10/2017  . Urinary frequency 07/20/2016  . Subcortical infarction ([place]) 07/09/2016  . PFO (patent foramen ovale)   . History of CVA with residual deficit   . Benign essential HTN   . Non compliance with medical treatment   . Hypokalemia   . Acute blood loss anemia   . Acute CVA (cerebrovascular accident) ([place]) 07/05/2016  . Embolic stroke involving left middle cerebral artery ([place])   . TIA (transient ischemic attack) 07/03/2016  . Right sided weakness 07/03/2016  . Microscopic hematuria 04/03/2015  . Hyperlipidemia 04/03/2015  . Wears hearing aid 04/03/2015  . Changing skin lesion 04/03/2015  . Paresthesia and pain of left extremity 04/02/2015  .  Encounter for health maintenance examination in adult 04/02/2015  . Vaccine counseling 04/02/2015  . Hernia of abdominal cavity 04/02/2015  . Impaired gait and mobility 04/02/2015  . Essential hypertension 03/03/2015  . Spastic hemiplegia affecting nondominant side ([place]) 12/14/2013  . ICH (intracerebral hemorrhage) ([place]) 08/02/2013  . Status post mitral valve repair   . History of stroke   . History of atrial fibrillation   . Cyst and pseudocyst of pancreas 09/13/2007  . PVC (premature ventricular contraction)   . History of renal cyst   . Benign prostatic hyperplasia     Past Surgical History:  Procedure Laterality Date  . CARDIAC CATHETERIZATION  02/2010   LVEF 70%, severe mitral regurgitation, pulm HTN; prior in 01/2006  . INGUINAL HERNIA REPAIR     bilat  . MITRAL VALVE REPAIR  01/2006   mitral repair with Maze procedure, tricuspid annuloplasty, MAZE and LAA oversewn by Dr. Roxy Manns        Home Medications    Prior to Admission medications   Medication Sig Start Date End Date Taking? Authorizing Provider  Cholecalciferol (VITAMIN D3 PO) Take by mouth.   Yes [provider]  sildenafil (VIAGRA) 100 MG tablet Take 0.5-1 tablets (50-100 mg total) by mouth daily as needed for erectile dysfunction. 05/02/18  Yes Park Liter, MD  amoxicillin (AMOXIL) 500 MG capsule 500 mg cap, take 4 cap 1 hour before dental work or surgery 09/13/17   Nche, Charlene Brooke, NP  olmesartan (BENICAR) 40 MG tablet Take 1 tablet (40 mg total) by mouth daily. Patient not taking: Reported on 12/27/2018 09/13/17   Nche, Charlene Brooke, NP  omeprazole (PRILOSEC) 20 MG capsule Take 1 capsule (20 mg total) by mouth daily. Patient not taking: Reported on 12/27/2018 12/07/16   Nche, Charlene Brooke, NP  pravastatin (PRAVACHOL) 40 MG tablet Take 1 tablet (40 mg total) by mouth daily at 6 PM. Patient not taking: Reported on 12/27/2018 12/07/16   Nche, Charlene Brooke, NP    Family History Family History   Family history unknown: Yes    Social History Social History   Tobacco Use  . Smoking status: Never Smoker  . Smokeless tobacco: Never Used  Substance Use Topics  . Alcohol use: No  . Drug use: No     Allergies   Patient has no known allergies.   Review of Systems Review of Systems  Musculoskeletal:       Left shoulder pain  Neurological: Positive for weakness.  All other systems reviewed and are negative.    Physical Exam Updated Vital Signs BP (!) 166/94   Pulse 95   Temp 99.3 F (37.4 C) (Oral)   Resp (!) 21   Ht 5\' 2"  (1.575 m)   Wt 61.2 kg   SpO2 94%   BMI 24.69 kg/m   Physical Exam Vitals signs and nursing note reviewed.  Constitutional:      Appearance: Normal appearance.  HENT:     Head: Normocephalic and atraumatic.     Right Ear: External ear normal.     Left Ear: External ear normal.     Nose: Nose normal.     Mouth/Throat:     Mouth: Mucous membranes are moist.     Pharynx: Oropharynx is clear.  Eyes:     Extraocular Movements: Extraocular movements intact.     Conjunctiva/sclera: Conjunctivae normal.     Pupils: Pupils are equal, round, and reactive to light.  Neck:     Musculoskeletal: Normal range of motion and neck supple.  Cardiovascular:     Rate and Rhythm: Tachycardia present. Rhythm irregular.     Pulses: Normal pulses.     Heart sounds: Normal heart sounds.  Pulmonary:     Effort: Pulmonary effort is normal.     Breath sounds: Normal breath sounds.  Abdominal:     General: Abdomen is flat. Bowel sounds are normal.     Palpations: Abdomen is soft.     Hernia: A hernia is present. Hernia is present in the ventral area.  Musculoskeletal:     Left shoulder: He exhibits tenderness.  Skin:    General: Skin is warm.     Capillary Refill: Capillary refill takes less than 2 seconds.  Neurological:     General: No focal deficit present.     Mental Status: He is alert and oriented to person, place, and time.  Psychiatric:         Mood and Affect: Mood normal.        Behavior: Behavior normal.      ED Treatments / Results  Labs (all labs ordered are listed, but only abnormal results are displayed) Labs Reviewed  CBC WITH DIFFERENTIAL/PLATELET - Abnormal; Notable for the following components:      Result Value   WBC 11.6 (*)    Hemoglobin 12.9 (*)    Neutro Abs 9.7 (*)    All other components within normal limits  COMPREHENSIVE METABOLIC PANEL - Abnormal; Notable for the following components:   Glucose, Bld 115 (*)    Calcium 8.8 (*)    Alkaline Phosphatase 36 (*)    Total Bilirubin 1.3 (*)    All other components within normal limits  URINALYSIS, ROUTINE W REFLEX MICROSCOPIC - Abnormal; Notable for the following components:   Hgb urine dipstick MODERATE (*)    Ketones, ur 20 (*)    Protein, ur 100 (*)    All other components within normal limits  PROTIME-INR - Abnormal; Notable for the following components:   Prothrombin Time 15.3 (*)    All other components within normal limits  CBG MONITORING, ED - Abnormal; Notable for the following components:   Glucose-Capillary 102 (*)    All other components within normal limits    EKG EKG Interpretation  Date/Time:  Wednesday December 27 2018 10:15:38 EDT Ventricular Rate:  109 PR Interval:    QRS Duration: 83  QT Interval:  341 QTC Calculation: 460 R Axis:   -16 Text Interpretation:  Atrial fibrillation Borderline left axis deviation RSR' in V1 or V2, probably normal variant Since last tracing rate faster Confirmed by Isla Pence [phone]) on 12/27/2018 10:46:21 AM   Radiology Ct Head Wo Contrast  Result Date: 12/27/2018 CLINICAL DATA:  Fall. EXAM: CT HEAD WITHOUT CONTRAST TECHNIQUE: Contiguous axial images were obtained from the base of the skull through the vertex without intravenous contrast. COMPARISON:  07/03/2016 FINDINGS: Brain: Ventricles, cisterns and other CSF spaces are within normal. Old left posterior watershed infarct. Small old right  posterior watershed infarct. Moderate chronic ischemic microvascular disease. Small old bilateral lacunar infarcts over the basal ganglia. Small old right cerebellar infarct. No mass, mass effect, shift of midline structures or acute hemorrhage. No acute infarction. Vascular: No hyperdense vessel or unexpected calcification. Skull: Normal. Negative for fracture or focal lesion. Sinuses/Orbits: No acute finding. Other: None. IMPRESSION: No acute findings. Moderate chronic ischemic microvascular disease and small old bilateral infarcts as described. Electronically Signed   By: Marin Olp M.D.   On: 12/27/2018 11:31   Dg Chest Portable 1 View  Result Date: 12/27/2018 CLINICAL DATA:  Fall last night. EXAM: PORTABLE CHEST 1 VIEW COMPARISON:  CTA chest and chest x-ray dated July 27, 2016. FINDINGS: The patient is rotated to the right. Unchanged mild cardiomegaly status post mitral and tricuspid valve replacements. Normal pulmonary vascularity. No focal consolidation, pleural effusion, or pneumothorax. No acute osseous abnormality. IMPRESSION: No active disease. Electronically Signed   By: Titus Dubin M.D.   On: 12/27/2018 11:24   Dg Shoulder Left  Result Date: 12/27/2018 CLINICAL DATA:  Fall. EXAM: LEFT SHOULDER - 2+ VIEW COMPARISON:  No prior. FINDINGS: Diffuse osteopenia and degenerative change. No acute bony abnormality. No evidence of fracture or dislocation. Soft tissues are unremarkable. IMPRESSION: Diffuse osteopenia and degenerative change. No acute bony abnormality. No evidence of fracture dislocation. Electronically Signed   By: Marcello Moores  Register   On: 12/27/2018 12:15    Procedures Procedures (including critical care time)  Medications Ordered in ED Medications - No data to display   Initial Impression / Assessment and Plan / ED Course  I have reviewed the triage vital signs and the nursing notes.  Pertinent labs & imaging results that were available during my care of the patient  were reviewed by me and considered in my medical decision making (see chart for details).        AMS work up negative for an acute cause.  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  Pt has been seen by Dr. Leonie Man (neurology) in the past.  Pt's wife instructed to call the office for an appt.  Pt's left shoulder has no fx.  Pt placed in a sling and instructed to f/u with ortho.  Home health ordered for pt.  Return if worse.   Final Clinical Impressions(s) / ED Diagnoses   Final diagnoses:  Altered mental status, unspecified altered mental status type  Strain of left shoulder, initial encounter  Permanent atrial fibrillation    ED Discharge Orders    None       Isla Pence, MD 12/27/18 1334

## 2018-12-27 NOTE — ED Triage Notes (Addendum)
Pt here via EMS from home, wife called out for ams for several weeks. Wife sts he is oriented but is challenged with directions in the house. Pt fell last night, endorses shoulder pain.

## 2018-12-27 NOTE — ED Notes (Signed)
Patient transported to CT 

## 2018-12-28 ENCOUNTER — Encounter: Payer: Self-pay | Admitting: Neurology

## 2018-12-28 ENCOUNTER — Telehealth: Payer: Self-pay | Admitting: Neurology

## 2018-12-28 ENCOUNTER — Ambulatory Visit (INDEPENDENT_AMBULATORY_CARE_PROVIDER_SITE_OTHER): Payer: Medicare Other | Admitting: Neurology

## 2018-12-28 DIAGNOSIS — G309 Alzheimer's disease, unspecified: Secondary | ICD-10-CM

## 2018-12-28 DIAGNOSIS — F0150 Vascular dementia without behavioral disturbance: Secondary | ICD-10-CM | POA: Diagnosis not present

## 2018-12-28 NOTE — Progress Notes (Signed)
Virtual Visit via Video Note  I connected with Jack [name] on 12/28/18 at 11:00 AM EDT by a video enabled telemedicine application and verified that I am speaking with the correct person using two identifiers.  Location: Patient: at home with his wife Provider: at [facility] office Ref MD : Isla Pence Reason for Referral : memory loss   I discussed the limitations of evaluation and management by telemedicine and the availability of in person appointments. The patient expressed understanding and agreed to proceed.  This visit was performed using [email or web address] app for audio and visual.  The patient's wife was present throughout the visit and help facilitate this visit.  History of Present Illness: Jack [name] is a 85 year old pleasant Caucasian male seen today for virtual video consultation visit for confusion and memory loss.  History is obtained from the patient and his wife as well as review of electronic medical records.  I personally reviewed imaging films where available in PACS.   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  The patient's wife states that she is noticed increasing confusion and cognitive impairment and memory loss for the last several months.  This has been gradually progressive.  She denies any recent episodes suggestive of stroke or TIA.  She has noticed recent episodes of confusion.  A few few days ago the patient was unable to find his way in his own home though is lived there for 20 years.  He fell yesterday in his home and hurt his shoulder.  He is unable to do most basics task and gets confused and is unable to complete task and his wife has to do more and more things for him.  Patient has not had any recent evaluation for reversible causes of dementia.   He did have lab work done yesterday in the ER and CBC, UA and CMP were unremarkable.  CT scan of the head showed moderate changes of chronic microvascular ischemia and old bilateral infarcts involving right posterior watershed and left posterior watershed as well as basal ganglia.  No acute abnormality was noted.  Patient has been a patient in the practice and in the past has been seen for intracerebral hemorrhage in January 2015 and has A. fib and hence has not been on long-term anticoagulation.  He had a stroke in December 2017.  He had previously been enrolled in the attached to trial for blood pressure control for intracerebral hemorrhage in the past Past medical history as stated above Past surgical history mitral valve repair.  Inguinal hernia repair. Social history patient is married lives with his wife Jack [name].  Does not smoke or drink. 14 system review of systems is positive for confusion, disorientation, memory loss, falls and all other systems negative Observations/Objective: Physical and neurological exams limited due to constraints from virtual video visit.  Pleasant elderly Caucasian male who does not appear to be in distress.  He is awake alert is oriented to person only.  He has diminished attention, registration and recall.  Recall is 0/3.  He is able to name only 5 animals which walk on 4 legs.  Clock drawing score is 1 out of 4 only.  His speech appears clear though it is slightly hypophonic and slow and hesitant.  No dysarthria or aphasia.  He follows simple midline and one-step commands.  Extraocular movements  are full range without nystagmus.  Hearing is diminished bilaterally.  Face is symmetric without weakness.  Tongue is midline.  Motor system exam shows no upper extremity drift but slightly diminished fine finger movements in the left hand.  Orbits right over left upper extremity.  He is able to stand on right leg unsupported but not on the left.  Gait is slightly broad-based and  unsteady.  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  Vascular risk factors of atrial fibrillation, hypertension, hyperlipidemia and prior strokes PLAN : I had a long discussion with the patient and his wife regarding his progressive cognitive decline which is likely due to dementia but we need to first rule out reversible etiologies.  Check memory panel labs, EEG and MRI scan.  Increase participation in cognitively challenging activities like playing sudoku, bridge and crossword puzzles.  Start aspirin 81 mg daily for stroke prevention.  Maintain strict control of hypertension with blood pressure goal below 130/90, lipids with LDL cholesterol goal below 70 mg percent.  Return for in person follow-up visit in the clinic in 4 weeks and may consider trial of medication like Aricept and Namenda at that visit. Follow Up Instructions: Check EEG and memory panel lab works and MRI Return for in person office follow-up visit in 1 month   I discussed the assessment and treatment plan with the patient. The patient was provided an opportunity to ask questions and all were answered. The patient agreed with the plan and demonstrated an understanding of the instructions.   The patient was advised to call back or seek an in-person evaluation if the symptoms worsen or if the condition fails to improve as anticipated.  I provided 30 minutes of non-face-to-face time during this encounter.   Antony Contras, MD

## 2018-12-28 NOTE — Telephone Encounter (Signed)
[employer] medicare order sent to GI. No auth they will reach out to the patient to schedule.  

## 2018-12-29 ENCOUNTER — Telehealth (HOSPITAL_COMMUNITY): Payer: Self-pay | Admitting: *Deleted

## 2018-12-29 NOTE — Telephone Encounter (Signed)
LMOM for clbk..  Pt not anticoag due to past bleed, but recently in ED with afib and altered mental status.  Pt was due for recall with Dr. Agustin Cree  In March, but due to covid this was not sched.

## 2019-01-03 ENCOUNTER — Encounter (HOSPITAL_COMMUNITY): Payer: Self-pay | Admitting: Physician Assistant

## 2019-01-03 ENCOUNTER — Other Ambulatory Visit: Payer: Self-pay

## 2019-01-03 ENCOUNTER — Ambulatory Visit (HOSPITAL_COMMUNITY)
Admission: RE | Admit: 2019-01-03 | Discharge: 2019-01-03 | Disposition: A | Discharge status: Home / Self Care | Payer: Medicare Other | Source: Ambulatory Visit | Attending: Physician Assistant | Admitting: Physician Assistant

## 2019-01-03 VITALS — BP 188/102 | HR 91 | Ht 62.0 in | Wt 135.0 lb

## 2019-01-03 DIAGNOSIS — Z7982 Long term (current) use of aspirin: Secondary | ICD-10-CM | POA: Insufficient documentation

## 2019-01-03 DIAGNOSIS — I480 Paroxysmal atrial fibrillation: Secondary | ICD-10-CM | POA: Insufficient documentation

## 2019-01-03 DIAGNOSIS — K439 Ventral hernia without obstruction or gangrene: Secondary | ICD-10-CM | POA: Diagnosis not present

## 2019-01-03 DIAGNOSIS — I10 Essential (primary) hypertension: Secondary | ICD-10-CM | POA: Diagnosis not present

## 2019-01-03 DIAGNOSIS — N529 Male erectile dysfunction, unspecified: Secondary | ICD-10-CM | POA: Diagnosis not present

## 2019-01-03 DIAGNOSIS — Z79899 Other long term (current) drug therapy: Secondary | ICD-10-CM | POA: Insufficient documentation

## 2019-01-03 DIAGNOSIS — I69354 Hemiplegia and hemiparesis following cerebral infarction affecting left non-dominant side: Secondary | ICD-10-CM | POA: Diagnosis not present

## 2019-01-03 DIAGNOSIS — E785 Hyperlipidemia, unspecified: Secondary | ICD-10-CM | POA: Diagnosis not present

## 2019-01-03 DIAGNOSIS — N400 Enlarged prostate without lower urinary tract symptoms: Secondary | ICD-10-CM | POA: Insufficient documentation

## 2019-01-03 MED ORDER — AMLODIPINE BESYLATE 5 MG PO TABS: 5.0000 mg | ORAL_TABLET | Freq: Every day | ORAL | 11 refills | Status: DC

## 2019-01-03 MED ORDER — OLMESARTAN MEDOXOMIL 40 MG PO TABS: 40.0000 mg | ORAL_TABLET | Freq: Every day | ORAL | 1 refills | Status: DC

## 2019-01-03 MED ORDER — ASPIRIN EC 81 MG PO TBEC: 81.0000 mg | DELAYED_RELEASE_TABLET | Freq: Every day | ORAL | Status: DC

## 2019-01-03 NOTE — Patient Instructions (Addendum)
Restart amlodipine 5mg  once a day  Start Aspiring 81mg  a day   Restart Benicar  Scheduling will be in touch with you to set up follow up with general cardiology in 2-3 weeks.   Please be sure to bring all the medications he is taking to this appointment.

## 2019-01-03 NOTE — Progress Notes (Signed)
Primary Care Physician: Lorayne Marek Charlene Brooke, NP Primary Cardiologist: Dr Agustin Cree Primary Electrophysiologist: Dr Rayann Heman Referring Physician: [facility]   Jack [name] is a 85 y.o. 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 He was seen recently at the ER for several weeks of worsening mental status. He has had more confusion and forgetfulness even with basic tasks. He follows with Dr Leonie Man and an EEG and MRI have been ordered to evaluate for reversible causes although it is possible this is due to vascular and Alzheimer's dementia. At the ER, he was noted to be in afib with HR of 109. On follow up today, patient reports that he feels well with no questions. He denies heart racing, palpitations, SOB, or CP. His wife who is with him states that he stopped taking almost all of his medicine. When asked, patient did not have a particular reason for stopping them.   Today, he denies symptoms of palpitations, chest pain, shortness of breath, orthopnea, PND, lower extremity edema, dizziness, presyncope, syncope, snoring, daytime somnolence, bleeding, or neurologic sequela. The patient is tolerating medications without difficulties and is otherwise without complaint today.     he has a BMI of Body mass index is 24.69 kg/m.Marland Kitchen Filed Weights   01/03/19 1359  Weight: 61.2 kg    Family History  Family history unknown: Yes     Atrial Fibrillation Management history:  Previous antiarrhythmic drugs: none Previous cardioversions: none Previous ablations: MAZE 2007 CHADS2VASC score: 5 Anticoagulation history: [place] 2015, not a candidate.    Past Medical History:  Diagnosis Date  . Atrial fibrillation ([place])    Dr. Tollie Eth  . BPH  (benign prostatic hypertrophy) 2012   prior therapy with finasteride, but as of 03/2015 no c/o; Dr. Luanne Bras  . Cellulitis of face 11/08   [facility] ENT  . CVA (cerebral infarction) 2009, 2015  . Echocardiogram abnormal 02/11/2010   LVEF 70%, severe mitral regurgitation, pulm HTN   . ED (erectile dysfunction)    Viagra prn, consult with Urology 2012  . Elevated PSA 2012   BPH; Dr. Luanne Bras, [facility]; no more PSAs needed as of 2012 consult  . Gait abnormality    uses cane, s/p CVA  . Hemiparesis affecting left side as late effect of cerebrovascular accident ([place])   . Hiatal hernia    per 2008 EUS  . Hyperlipidemia   . Hypertension   . Microscopic hematuria    chronic, eval 2012 with Urology, benign at that time  . Obstructive uropathy 2012   Dr. Luanne Bras  . Pancreatic mass 01/2006   inconsequential per CT 2007  . 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 Patent foramen ovale    hx/o, notation of fistula on echocardiogram from 2009  . Prophylactic antibiotic 2016   requires due to valve disease  . Schatzki's ring 2008   per endoscopic ultrasound; Dr. Janetta Hora. [name]  . Stroke ([place])   . Ventral hernia    surgery consult 01/2010 with Dr. Excell Seltzer, elective repair if desired , but he declined  . Wears hearing aid    Past Surgical History:  Procedure Laterality Date  . CARDIAC CATHETERIZATION  02/2010   LVEF 70%, severe mitral regurgitation, pulm HTN; prior in  01/2006  . INGUINAL HERNIA REPAIR     bilat  . MITRAL VALVE REPAIR  01/2006   mitral repair with Maze procedure, tricuspid annuloplasty, MAZE and LAA oversewn by Dr. Roxy Manns    Current Outpatient Medications  Medication Sig Dispense Refill  . Cholecalciferol (VITAMIN D3 PO) Take by mouth.    . sildenafil (VIAGRA) 100 MG tablet Take 0.5-1 tablets (50-100 mg total) by mouth daily as needed for erectile dysfunction. 30 tablet 0  .  amLODipine (NORVASC) 5 MG tablet Take 1 tablet (5 mg total) by mouth daily. 30 tablet 11  . amoxicillin (AMOXIL) 500 MG capsule 500 mg cap, take 4 cap 1 hour before dental work or surgery    . aspirin EC 81 MG tablet Take 1 tablet (81 mg total) by mouth daily.    Marland Kitchen olmesartan (BENICAR) 40 MG tablet Take 1 tablet (40 mg total) by mouth daily. 90 tablet 1   No current facility-administered medications for this encounter.     No Known Allergies  Social History   Socioeconomic History  . Marital status: Married    Spouse name: [name]  . Number of children: Not on file  . Years of education: Not on file  . Highest education level: Not on file  Occupational History  . Not on file  Social Needs  . Financial resource strain: Not on file  . Food insecurity    Worry: Not on file    Inability: Not on file  . Transportation needs    Medical: Not on file    Non-medical: Not on file  Tobacco Use  . Smoking status: Never Smoker  . Smokeless tobacco: Never Used  Substance and Sexual Activity  . Alcohol use: No  . Drug use: No  . Sexual activity: Not on file  Lifestyle  . Physical activity    Days per week: Not on file    Minutes per session: Not on file  . Stress: Not on file  Relationships  . Social Herbalist on phone: Not on file    Gets together: Not on file    Attends religious service: Not on file    Active member of club or organization: Not on file    Attends meetings of clubs or organizations: Not on file    Relationship status: Not on file  . Intimate partner violence    Fear of current or ex partner: Not on file    Emotionally abused: Not on file    Physically abused: Not on file    Forced sexual activity: Not on file  Other Topics Concern  . Not on file  Social History Narrative   Married, exercises with stationary bike 5 days per week.   Eats healthy.  As of 03/2015     ROS- All systems are reviewed and negative except as per the HPI above.  Physical  Exam: Vitals:   01/03/19 1359  BP: (!) 188/102  Pulse: 91  Weight: 61.2 kg  Height: 5\' 2"  (1.575 m)    GEN- The patient is well appearing elederly male, alert and oriented to place.   Head- normocephalic, atraumatic Eyes-  Sclera clear, conjunctiva pink Ears- hearing intact Oropharynx- clear Neck- supple  Lungs- Clear to ausculation bilaterally, normal work of breathing Heart- Regular rate and rhythm, occasional ectopic beat, no murmurs, rubs or gallops  GI- soft, NT, ND, + BS Extremities- no clubbing, cyanosis, or edema MS- no significant deformity or atrophy Skin- no rash or  lesion Psych- euthymic mood, full affect Neuro- strength and sensation are intact  Wt Readings from Last 3 Encounters:  01/03/19 61.2 kg  12/27/18 61.2 kg  05/05/18 64.5 kg    EKG today demonstrates SR HR 91, 1st degree AV block, PACs, PR 238, QRs 82, QTc 457   Echo 10/29/16 demonstrated  EF 55%, grade III diastolic dysfunction, mod LAE, trace MV stenosis  Epic records are reviewed at length today  Assessment and Plan:  1. Paroxysmal atrial fibrillation S/p MAZE 2007. Patient had paroxysm of afib at ER. He is in SR today. Not on anticoagulation 2/2 ICH in 2015. Discussed therapeutic options and due to patient's age, functional capacity, lack of symptoms, and comorbidities, a conservative approach was decided.  Restart ASA 81 mg daily per last neurology note.  This patients CHA2DS2-VASc Score and unadjusted Ischemic Stroke Rate (% per year) is equal to 7.2 % stroke rate/year from a score of 5  Above score calculated as 1 point each if present [CHF, HTN, DM, Vascular=MI/PAD/Aortic Plaque, Age if 65-74, or Male] Above score calculated as 2 points each if present [Age > 75, or Stroke/TIA/TE]   2. HTN Very elevated today. Patient had stopped taking all of his HTN medications. Stressed importance of strict BP control for stroke prevention. Patient to resume olmesartan and amlodipine with close  follow up.   Follow up with general cardiology in 2-3 weeks.   [facility] [address] [place], [place] [address] [phone] 01/03/2019 2:40 PM

## 2019-01-26 ENCOUNTER — Ambulatory Visit (INDEPENDENT_AMBULATORY_CARE_PROVIDER_SITE_OTHER): Payer: Medicare Other | Admitting: Nurse Practitioner

## 2019-01-26 ENCOUNTER — Telehealth: Payer: Self-pay

## 2019-01-26 ENCOUNTER — Encounter: Payer: Self-pay | Admitting: Nurse Practitioner

## 2019-01-26 VITALS — BP 166/88 | HR 96 | Temp 98.2°F | Ht 62.0 in | Wt 140.8 lb

## 2019-01-26 DIAGNOSIS — R2689 Other abnormalities of gait and mobility: Secondary | ICD-10-CM

## 2019-01-26 DIAGNOSIS — E785 Hyperlipidemia, unspecified: Secondary | ICD-10-CM

## 2019-01-26 DIAGNOSIS — H9391 Unspecified disorder of right ear: Secondary | ICD-10-CM

## 2019-01-26 DIAGNOSIS — F01518 Vascular dementia, unspecified severity, with other behavioral disturbance: Secondary | ICD-10-CM

## 2019-01-26 DIAGNOSIS — Z91199 Patient's noncompliance with other medical treatment and regimen due to unspecified reason: Secondary | ICD-10-CM

## 2019-01-26 DIAGNOSIS — Z9119 Patient's noncompliance with other medical treatment and regimen: Secondary | ICD-10-CM

## 2019-01-26 DIAGNOSIS — R350 Frequency of micturition: Secondary | ICD-10-CM | POA: Diagnosis not present

## 2019-01-26 DIAGNOSIS — I10 Essential (primary) hypertension: Secondary | ICD-10-CM

## 2019-01-26 DIAGNOSIS — Z7189 Other specified counseling: Secondary | ICD-10-CM | POA: Diagnosis not present

## 2019-01-26 DIAGNOSIS — R739 Hyperglycemia, unspecified: Secondary | ICD-10-CM | POA: Diagnosis not present

## 2019-01-26 DIAGNOSIS — I6930 Unspecified sequelae of cerebral infarction: Secondary | ICD-10-CM

## 2019-01-26 DIAGNOSIS — F0151 Vascular dementia with behavioral disturbance: Secondary | ICD-10-CM

## 2019-01-26 DIAGNOSIS — N401 Enlarged prostate with lower urinary tract symptoms: Secondary | ICD-10-CM | POA: Insufficient documentation

## 2019-01-26 LAB — PSA: PSA: 5.1 ng/mL — ABNORMAL HIGH (ref <=4.0)

## 2019-01-26 MED ORDER — OLMESARTAN MEDOXOMIL 40 MG PO TABS: 40.0000 mg | ORAL_TABLET | Freq: Every day | ORAL | 1 refills | Status: DC

## 2019-01-26 MED ORDER — AMLODIPINE BESYLATE 5 MG PO TABS: 5.0000 mg | ORAL_TABLET | Freq: Every day | ORAL | 1 refills | Status: DC

## 2019-01-26 NOTE — Patient Instructions (Addendum)
Stable renal function and hgbA1c,  Persistent elevated PSA at 5.1. which could be due to an enlarged prostate. I encourage start flomax and also entered referral to urology Also entered referral to dermatology for evaluation of right ear lesion  Jack [name] should manage medications from now on. Resume medications on list.  Advance Directive  Advance directives are legal documents that let you make choices ahead of time about your health care and medical treatment in case you become unable to communicate for yourself. Advance directives are a way for you to communicate your wishes to family, friends, and health care providers. This can help convey your decisions about end-of-life care if you become unable to communicate. Discussing and writing advance directives should happen over time rather than all at once. Advance directives can be changed depending on your situation and what you want, even after you have signed the advance directives. If you do not have an advance directive, some states assign family decision makers to act on your behalf based on how closely you are related to them. Each state has its own laws regarding advance directives. You may want to check with your health care provider, attorney, or state representative about the laws in your state. There are different types of advance directives, such as:  Medical power of attorney.  Living will.  Do not resuscitate (DNR) or do not attempt resuscitation (DNAR) order. Health care proxy and medical power of attorney A health care proxy, also called a health care agent, is a person who is appointed to make medical decisions for you in cases in which you are unable to make the decisions yourself. Generally, people choose someone they know well and trust to represent their preferences. Make sure to ask this person for an agreement to act as your proxy. A proxy may have to exercise judgment in the event of a medical decision for which your  wishes are not known. A medical power of attorney is a legal document that names your health care proxy. Depending on the laws in your state, after the document is written, it may also need to be:  Signed.  Notarized.  Dated.  Copied.  Witnessed.  Incorporated into your medical record. You may also want to appoint someone to manage your financial affairs in a situation in which you are unable to do so. This is called a durable power of attorney for finances. It is a separate legal document from the durable power of attorney for health care. You may choose the same person or someone different from your health care proxy to act as your agent in financial matters. If you do not appoint a proxy, or if there is a concern that the proxy is not acting in your best interests, a court-appointed guardian may be designated to act on your behalf. Living will A living will is a set of instructions documenting your wishes about medical care when you cannot express them yourself. Health care providers should keep a copy of your living will in your medical record. You may want to give a copy to family members or friends. To alert caregivers in case of an emergency, you can place a card in your wallet to let them know that you have a living will and where they can find it. A living will is used if you become:  Terminally ill.  Incapacitated.  Unable to communicate or make decisions. Items to consider in your living will include:  The use or non-use of life-sustaining  equipment, such as dialysis machines and breathing machines (ventilators).  A DNR or DNAR order, which is the instruction not to use cardiopulmonary resuscitation (CPR) if breathing or heartbeat stops.  The use or non-use of tube feeding.  Withholding of food and fluids.  Comfort (palliative) care when the goal becomes comfort rather than a cure.  Organ and tissue donation. A living will does not give instructions for distributing  your money and property if you should pass away. It is recommended that you seek the advice of a lawyer when writing a will. Decisions about taxes, beneficiaries, and asset distribution will be legally binding. This process can relieve your family and friends of any concerns surrounding disputes or questions that may come up about the distribution of your assets. DNR or DNAR A DNR or DNAR order is a request not to have CPR in the event that your heart stops beating or you stop breathing. If a DNR or DNAR order has not been made and shared, a health care provider will try to help any patient whose heart has stopped or who has stopped breathing. If you plan to have surgery, talk with your health care provider about how your DNR or DNAR order will be followed if problems occur. Summary  Advance directives are the legal documents that allow you to make choices ahead of time about your health care and medical treatment in case you become unable to communicate for yourself.  The process of discussing and writing advance directives should happen over time. You can change the advance directives, even after you have signed them.  Advance directives include DNR or DNAR orders, living wills, and designating an agent as your medical power of attorney. This information is not intended to replace advice given to you by your health care provider. Make sure you discuss any questions you have with your health care provider. Document Released: 10/05/2007 Document Revised: 08/02/2018 Document Reviewed: 05/17/2016 Elsevier Patient Education  2020 [employer].

## 2019-01-26 NOTE — Progress Notes (Signed)
Subjective:  Patient ID: Jack [name], male    DOB: [birth year]  Age: 85 y.o. MRN: [id number]  CC: Hypertension (f/up, did not take medication today, accompanied by wife.)  Urinary Frequency  This is a chronic problem. The current episode started more than 1 year ago. The problem occurs every urination. The problem has been unchanged. The patient is experiencing no pain. There has been no fever. The fever has been present for less than 1 day. He is not sexually active. There is no history of pyelonephritis. Associated symptoms include frequency and hesitancy. Pertinent negatives include no chills, discharge, flank pain, hematuria, nausea, sweats or urgency. He has tried nothing for the symptoms. His past medical history is significant for urinary stasis. There is no history of catheterization, recurrent UTIs or a urological procedure.   HTN: Elevated. wife indicates that patient refuses to take edication. BP Readings from Last 3 Encounters:  01/26/19 (!) 166/88  01/03/19 (!) 188/102  12/27/18 (!) 165/87   Dementia: Wife is primary caregiver, they have 2adult sons and several grandchildren who live locally and visit frequently. Wife reports he is verbally abusive at home, especially with medication. Reports he is able to maintain independence with eating, bathing, and dressing. Denies any wandering out of home. Under care of Dr. Leonie Man for vascular dementia. Last Video appt 12/28/2018 (they do not remember this)  A-fib: Under care of cardiology: Dr, Marlene Lard, last OV 01/03/19. No anticoagulant due to previous intracranial bleed.  Reviewed past Medical, Social and Family history today.  Outpatient Medications Prior to Visit  Medication Sig Dispense Refill  . aspirin EC 81 MG tablet Take 1 tablet (81 mg total) by mouth daily.    . Cholecalciferol (VITAMIN D3 PO) Take by mouth.    Marland Kitchen amLODipine (NORVASC) 5 MG tablet Take 1 tablet (5 mg total) by mouth daily. 30 tablet 11  . olmesartan  (BENICAR) 40 MG tablet Take 1 tablet (40 mg total) by mouth daily. 90 tablet 1  . sildenafil (VIAGRA) 100 MG tablet Take 0.5-1 tablets (50-100 mg total) by mouth daily as needed for erectile dysfunction. 30 tablet 0  . amoxicillin (AMOXIL) 500 MG capsule 500 mg cap, take 4 cap 1 hour before dental work or surgery     No facility-administered medications prior to visit.     ROS See HPI  Objective:  BP (!) 166/88   Pulse 96   Temp 98.2 F (36.8 C) (Oral)   Ht 5\' 2"  (1.575 m)   Wt 140 lb 12.8 oz (63.9 kg)   SpO2 98%   BMI 25.75 kg/m   BP Readings from Last 3 Encounters:  01/26/19 (!) 166/88  01/03/19 (!) 188/102  12/27/18 (!) 165/87    Wt Readings from Last 3 Encounters:  01/26/19 140 lb 12.8 oz (63.9 kg)  01/03/19 135 lb (61.2 kg)  12/27/18 135 lb (61.2 kg)    Physical Exam Vitals signs reviewed.  HENT:     Ears:      Comments: HOH Neck:     Musculoskeletal: Normal range of motion.  Cardiovascular:     Rate and Rhythm: Rhythm irregular.     Pulses: Normal pulses.  Musculoskeletal:     Right lower leg: No edema.     Left lower leg: No edema.  Skin:    Findings: Lesion present.  Neurological:     Mental Status: He is alert. He is confused.     Comments: Ambulates with walker     Lab Results  Component Value Date   WBC 11.6 (H) 12/27/2018   HGB 12.9 (L) 12/27/2018   HCT 39.0 12/27/2018   PLT 267 12/27/2018   GLUCOSE 99 01/26/2019   CHOL 151 03/10/2017   TRIG 143.0 03/10/2017   HDL 30.40 (L) 03/10/2017   LDLCALC 92 03/10/2017   ALT 14 12/27/2018   AST 19 12/27/2018   NA 141 01/26/2019   K 4.0 01/26/2019   CL 104 01/26/2019   CREATININE 0.87 01/26/2019   BUN 16 01/26/2019   CO2 29 01/26/2019   TSH 0.85 01/26/2019   PSA 5.1 (H) 01/26/2019   INR 1.2 12/27/2018   HGBA1C 5.8 (H) 01/26/2019    Assessment & Plan:   Jack [name] was seen today for hypertension.  Diagnoses and all orders for this visit:  Essential hypertension -     Basic metabolic  panel -     amLODipine (NORVASC) 5 MG tablet; Take 1 tablet (5 mg total) by mouth daily. -     olmesartan (BENICAR) 40 MG tablet; Take 1 tablet (40 mg total) by mouth daily. -     TSH -     AMB Referral to [facility]  Hyperlipidemia, unspecified hyperlipidemia type  Hyperglycemia -     Hemoglobin A1c  Counseling regarding advanced directives  History of CVA with residual deficit -     AMB Referral to [place] [facility]  Impaired gait and mobility -     AMB Referral to [place] [facility]  Vascular dementia with behavior disturbance (HCC) -     AMB Referral to [place] [facility]  Benign prostatic hyperplasia with urinary frequency -     Urinalysis w microscopic + reflex cultur -     PSA -     REFLEXIVE URINE CULTURE -     Ambulatory referral to Urology -     tamsulosin (FLOMAX) 0.4 MG CAPS capsule; Take 1 capsule (0.4 mg total) by mouth daily after supper.  Lesion of right ear -     Ambulatory referral to Dermatology  Non compliance with medical treatment -     AMB Referral to [place]  Other orders -     Cancel: PSA   I have discontinued Jack [name]'s amoxicillin and sildenafil. I am also having him start on tamsulosin. Additionally, I am having him maintain his Cholecalciferol (VITAMIN D3 PO), aspirin EC, amLODipine, and olmesartan.  Meds ordered this encounter  Medications  . amLODipine (NORVASC) 5 MG tablet    Sig: Take 1 tablet (5 mg total) by mouth daily.    Dispense:  90 tablet    Refill:  1    Order Specific Question:   Supervising Provider    Answer:   MATTHEWS, CODY [[id number]]  . olmesartan (BENICAR) 40 MG tablet    Sig: Take 1 tablet (40 mg total) by mouth daily.    Dispense:  90 tablet    Refill:  1    Order Specific Question:   Supervising Provider    Answer:   MATTHEWS, CODY [[id number]]  . tamsulosin (FLOMAX) 0.4 MG CAPS capsule    Sig: Take 1 capsule (0.4 mg total) by mouth daily after supper.    Dispense:  30 capsule    Refill:   5    Order Specific Question:   Supervising Provider    Answer:   Lucille Passy [[id number]]    Problem List Items Addressed This Visit      Cardiovascular and Mediastinum   Essential  hypertension - Primary   Relevant Medications   amLODipine (NORVASC) 5 MG tablet   olmesartan (BENICAR) 40 MG tablet   Other Relevant Orders   Basic metabolic panel (Completed)   TSH (Completed)   AMB Referral to [place] [facility]     Nervous and Auditory   Lesion of right ear    Right tringular fossa, unknown onset Ref to dermatology      Relevant Orders   Ambulatory referral to Dermatology   Vascular dementia with behavior disturbance ([place])    Verbally abusive per wife. No wandering out of home per wife. Wife is primary caregiver and denies need for assistance at home at this time. Alert and oriented to self and wife only. He dose need assistance with bathing, dressing and feeding. Ambulates with walker. They have 2 adult children and grandchildren who live locally and visit frequently. Under care of Dr. Leonie Man (neurology). last appt 12/28/2018 (video).  [facility] referral enterred to help assess home situation and possible needs.      Relevant Orders   AMB Referral to [place] [facility]     Other   Benign prostatic hyperplasia with urinary frequency    Urinary frequency BPH at 5.1 Start flomax entered referral to urology      Relevant Medications   tamsulosin (FLOMAX) 0.4 MG CAPS capsule   Other Relevant Orders   Urinalysis w microscopic + reflex cultur (Completed)   PSA (Completed)   REFLEXIVE URINE CULTURE (Completed)   Ambulatory referral to Urology   Counseling regarding advanced directives   History of CVA with residual deficit   Relevant Orders   AMB Referral to [place] [facility]   Hyperglycemia   Relevant Orders   Hemoglobin A1c (Completed)   Hyperlipidemia   Relevant Medications   amLODipine (NORVASC) 5 MG tablet   olmesartan (BENICAR) 40 MG tablet   Impaired gait and  mobility   Relevant Orders   AMB Referral to [place] [facility]   Non compliance with medical treatment   Relevant Orders   AMB Referral to [place] [facility]      Follow-up: Return in about 4 weeks (around 02/23/2019) for HTN.  Wilfred Lacy, NP

## 2019-01-26 NOTE — Telephone Encounter (Signed)
Questions for Screening COVID-19  Symptom onset: none, Pt's wife, Jack [name] , stated Pt has always had a cough. Has not increased or worsen. [name] did repeat phone # [phone] and requested office address.   Travel or Contacts: None  During this illness, did/does the patient experience any of the following symptoms? Fever >100.3F []   Yes [x]   No []   Unknown Subjective fever (felt feverish) []   Yes [x]   No []   Unknown Chills []   Yes [x]   No []   Unknown Muscle aches (myalgia) []   Yes [x]   No []   Unknown Runny nose (rhinorrhea) []   Yes [x]   No []   Unknown Sore throat []   Yes [x]   No []   Unknown Cough (new onset or worsening of chronic cough) []   Yes [x]   No []   Unknown Shortness of breath (dyspnea) []   Yes [x]   No []   Unknown Nausea or vomiting []   Yes [x]   No []   Unknown Headache []   Yes []   No [x]   Unknown Abdominal pain  []   Yes [x]   No []   Unknown Diarrhea (?3 loose/looser than normal stools/24hr period) []   Yes [x]   No []   Unknown Other, specify:  Patient risk factors: Smoker? []   Current []   Former [x]   Never If male, currently pregnant? []   Yes [x]   No  Patient Active Problem List   Diagnosis Date Noted  . Status post coronary artery bypass graft 05/05/2018  . Hyperglycemia 03/10/2017  . Elevated PSA, less than 10 ng/ml 03/10/2017  . Urinary frequency 07/20/2016  . Subcortical infarction ([place]) 07/09/2016  . PFO (patent foramen ovale)   . History of CVA with residual deficit   . Benign essential HTN   . Non compliance with medical treatment   . Hypokalemia   . Acute blood loss anemia   . Acute CVA (cerebrovascular accident) ([place]) 07/05/2016  . Embolic stroke involving left middle cerebral artery ([place])   . TIA (transient ischemic attack) 07/03/2016  . Right sided weakness 07/03/2016  . Microscopic hematuria 04/03/2015  . Hyperlipidemia 04/03/2015  . Wears hearing aid 04/03/2015  . Changing skin lesion 04/03/2015  . Paresthesia and pain of left extremity 04/02/2015  .  Encounter for health maintenance examination in adult 04/02/2015  . Vaccine counseling 04/02/2015  . Hernia of abdominal cavity 04/02/2015  . Impaired gait and mobility 04/02/2015  . Essential hypertension 03/03/2015  . Spastic hemiplegia affecting nondominant side ([place]) 12/14/2013  . ICH (intracerebral hemorrhage) ([place]) 08/02/2013  . Status post mitral valve repair   . History of stroke   . History of atrial fibrillation   . Cyst and pseudocyst of pancreas 09/13/2007  . PVC (premature ventricular contraction)   . History of renal cyst   . Benign prostatic hyperplasia     Plan:  []   High risk for COVID-19 with red flags go to ED (with CP, SOB, weak/lightheaded, or fever > 101.5). Call ahead.  []   High risk for COVID-19 but stable. Inform provider and coordinate time for [facility] visit.   [x]   No red flags but URI signs or symptoms okay for [facility] visit.

## 2019-01-27 LAB — URINALYSIS W MICROSCOPIC + REFLEX CULTURE
Bacteria, UA: NONE SEEN /HPF
Bilirubin Urine: NEGATIVE
Glucose, UA: NEGATIVE
Hyaline Cast: NONE SEEN /LPF
Ketones, ur: NEGATIVE
Leukocyte Esterase: NEGATIVE
Nitrites, Initial: NEGATIVE
Protein, ur: NEGATIVE
Specific Gravity, Urine: 1.014 (ref 1.001–1.03)
Squamous Epithelial / HPF: NONE SEEN /HPF (ref <=5)
WBC, UA: NONE SEEN /HPF (ref 0–5)
pH: 6.5 (ref 5.0–8.0)

## 2019-01-27 LAB — HEMOGLOBIN A1C
Hgb A1c MFr Bld: 5.8 % of total Hgb — ABNORMAL HIGH (ref <5.7)
Mean Plasma Glucose: 120 (calc)
eAG (mmol/L): 6.6 (calc)

## 2019-01-27 LAB — BASIC METABOLIC PANEL
BUN: 16 mg/dL (ref 7–25)
CO2: 29 mmol/L (ref 20–32)
Calcium: 9 mg/dL (ref 8.6–10.3)
Chloride: 104 mmol/L (ref 98–110)
Creat: 0.87 mg/dL (ref 0.70–1.11)
Glucose, Bld: 99 mg/dL (ref 65–99)
Potassium: 4 mmol/L (ref 3.5–5.3)
Sodium: 141 mmol/L (ref 135–146)

## 2019-01-27 LAB — NO CULTURE INDICATED

## 2019-01-27 LAB — TSH: TSH: 0.85 mIU/L (ref 0.40–4.50)

## 2019-01-29 ENCOUNTER — Other Ambulatory Visit: Payer: Self-pay

## 2019-01-29 ENCOUNTER — Encounter: Payer: Self-pay | Admitting: Nurse Practitioner

## 2019-01-29 ENCOUNTER — Ambulatory Visit
Admission: RE | Admit: 2019-01-29 | Discharge: 2019-01-29 | Disposition: A | Discharge status: Home / Self Care | Payer: Medicare Other | Source: Ambulatory Visit | Attending: Neurology | Admitting: Neurology

## 2019-01-29 DIAGNOSIS — H9391 Unspecified disorder of right ear: Secondary | ICD-10-CM | POA: Insufficient documentation

## 2019-01-29 DIAGNOSIS — G309 Alzheimer's disease, unspecified: Secondary | ICD-10-CM

## 2019-01-29 DIAGNOSIS — F0151 Vascular dementia with behavioral disturbance: Secondary | ICD-10-CM | POA: Insufficient documentation

## 2019-01-29 DIAGNOSIS — F01518 Vascular dementia, unspecified severity, with other behavioral disturbance: Secondary | ICD-10-CM | POA: Insufficient documentation

## 2019-01-29 DIAGNOSIS — H9190 Unspecified hearing loss, unspecified ear: Secondary | ICD-10-CM | POA: Insufficient documentation

## 2019-01-29 MED ORDER — GADOBENATE DIMEGLUMINE 529 MG/ML IV SOLN
12.0000 mL | Freq: Once | INTRAVENOUS | Status: AC | PRN
  Administered 2019-01-29: 12 mL via INTRAVENOUS

## 2019-01-29 MED ORDER — TAMSULOSIN HCL 0.4 MG PO CAPS: 0.4000 mg | ORAL_CAPSULE | Freq: Every day | ORAL | 5 refills | Status: DC

## 2019-01-29 NOTE — Assessment & Plan Note (Signed)
Right tringular fossa, unknown onset Ref to dermatology
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
## 2019-01-29 NOTE — Assessment & Plan Note (Signed)
Use of hearing aids

## 2019-01-29 NOTE — Assessment & Plan Note (Signed)
Urinary frequency BPH at 5.1 Start flomax entered referral to urology

## 2019-01-31 ENCOUNTER — Telehealth: Payer: Self-pay

## 2019-01-31 NOTE — Telephone Encounter (Signed)
-----   Message from Garvin Fila, MD sent at 01/30/2019  4:41 PM EDT ----- Jack [name] inform the patient that MRI scan of the brain shows changes of age-related shrinkage of the brain, hardening of the arteries and a few old small strokes which appears slightly progressed compared with previous MRIs but no new or worrisome finding

## 2019-01-31 NOTE — Telephone Encounter (Signed)
I spoke with Jack [name] pts wife I stated the MRI scan of the brain shows changes of age-related shrinkage of the brain, hardening of the arteries and a few old small strokes which appears slightly progressed compared with previous MRIs but no new or worrisome finding. The wife wrote down the findings and verbalized understanding.  ------

## 2019-01-31 NOTE — Telephone Encounter (Signed)
Notes recorded by Marval Regal, RN on 01/31/2019 at 11:24 AM EDT  I spoke with [name] pts wife I stated the MRI scan of the brain shows changes of age-related shrinkage of the brain, hardening of the arteries and a few old small strokes which appears slightly progressed compared with previous MRIs but no new or worrisome finding. The wife wrote down the findings and verbalized understanding.  ------

## 2019-02-02 ENCOUNTER — Telehealth: Payer: Self-pay | Admitting: Nurse Practitioner

## 2019-02-02 NOTE — Telephone Encounter (Signed)
LVM for pt's spouse to call back,   Copied from [place] [id number]. Topic: General - Inquiry >> Feb 02, 2019 11:57 AM Richardo Priest, NT wrote: Reason for CRM: Patient's wife called in wanting to discuss tamsulosin (FLOMAX) 0.4 MG CAPS capsule, with Pete. Please advise and call back is [phone].

## 2019-02-05 NOTE — Telephone Encounter (Signed)
Pt' spouse is aware, drug allergy added.

## 2019-02-05 NOTE — Telephone Encounter (Signed)
Pt's spouse stated that she remember [name] ask if he is allergic to sulfa and she answer no. Now pt remember that he was allergic to it at one point--cause swelling in groin area.   [place] please advise, pt has not start Flomax yet--waiting for [place] to response.

## 2019-02-05 NOTE — Telephone Encounter (Signed)
I think he should be ok with Flomax. Flomax is contraindicated only if previous allergic reaction to sulfa were life threatening. Add sulfa reaction to allergies list.

## 2019-02-21 ENCOUNTER — Encounter: Payer: Self-pay | Admitting: Neurology

## 2019-02-21 ENCOUNTER — Other Ambulatory Visit: Payer: Self-pay

## 2019-02-21 ENCOUNTER — Ambulatory Visit (INDEPENDENT_AMBULATORY_CARE_PROVIDER_SITE_OTHER): Payer: Medicare Other | Admitting: Neurology

## 2019-02-21 VITALS — BP 136/52 | HR 86 | Temp 98.4°F | Wt 140.0 lb

## 2019-02-21 DIAGNOSIS — F0150 Vascular dementia without behavioral disturbance: Secondary | ICD-10-CM | POA: Diagnosis not present

## 2019-02-21 DIAGNOSIS — R413 Other amnesia: Secondary | ICD-10-CM | POA: Diagnosis not present

## 2019-02-21 MED ORDER — DONEPEZIL HCL 10 MG PO TABS: 10.0000 mg | ORAL_TABLET | Freq: Every day | ORAL | 2 refills | Status: DC

## 2019-02-21 NOTE — Progress Notes (Signed)
[facility]  PATIENT: Jack [name] DOB: 02/09/1934   REASON FOR VISIT: Follow-up for stroke HISTORY FROM: Patient and wife   HISTORY OF PRESENT ILLNESS:  UPDATE 09/12/17 (JV): Jack [name] is being seen today for six-month follow-up.  He is accompanied by his wife.  Patient continues to take aspirin without side effects of increased bleeding or bruising.  Patient continues to take pravastatin without side effects of myalgias.  Blood pressure is satisfactory and at today's visit is 132/75.  Patient does have cane to assist with ambulation.  Patient denies new or worsening stroke/TIA symptoms.  UPDATE 03/15/2017 (CM) Jack [name], 85 year old male returns for follow-up with history of stroke event in December 2017. Patient also has a history of atrial fibrillation and previous intracranial bleed 2015 and she was not on anticoagulation despite having atrial fibrillation. when admitted. He is currently on aspirin for secondary stroke prevention without further stroke TIA symptoms. He has no bruising and no  bleeding Blood pressure in the office today 127/73. He remains on Pravachol for hyperlipidemia without myalgias. He continues to ambulate with single-point cane, denies any falls. He has completed all outpatient therapies and is inconsistent with his home exercise program however he does use his recumbent bike at the gym. He returns for reevaluation   HISTORY 09/09/16  9Dr Leonie Man) Jack [name] is a 15 year Caucasian male seen today for first office follow-up visit for hospital admission for stroke in December 2017. He is accompanied by his wife. History is obtained from the patient, wife and review of Hospital medical records and I have personally reviewed imaging studies.Jack [name] an 85 y.o.malewith a history of stroke 2 years agowith residual left hemiparesis,hypertension, hyperlipidemia and atrial fibrillation not on anticoagulation, seen in the ED at [facility] with a complaint of  new onset right-sided weakness. Deficit was present when he woke up this morning. He was last known well at 10:00 last night. His wife noted difficulty with ambulation with coordination of his right lower extremity aswell as coordination of his right upper extremity using his cane.CT scan of his head showed no acute changes. MRI is pending. NIH stroke score at the time of this evaluation was 2. Patient was LKW 10:00 PM on 07/02/2016. tPA was not given as beyond time under for treatment consideration when he arrived in the ED. the patient and her hemorrhagic stroke in 2015 with right thalamic hemorrhage and hence was not a candidate for anticoagulation despite having atrial fibrillation. He was enrolled in [facility] 2 trial for blood pressure control at that time. He underwent mitral valvuloplasty in 2007 and a maze procedure with essentially occluded left atrial appendage. CT scan of the brain on admission showed multiple old lacunar infarcts. MRI scan showed small acute cortical and subcortical left frontoparietal infarcts with a punctate infarct in the right occipital cortex is patent. There are chronic infarcts in both cerebellum and subcortical white matter. Lower extremity venous Dopplers were negative for DVT. Carotid ultrasound showed no significant extracranial stenosis. LDL cholesterol was 84 mg percent. Hemoglobin A1c was 5.5. Transthoracic echo showed normal ejection fraction with mitral annuloplasty ring in place  without significant stenosis.CHA2DS2-VASc Score = 5. He had a history of patent foramen ovale but lower extremity venous Dopplers current admission were negative. He has finished outpatient therapies and is currently walking with a cane for long distances but can walk without it in dose. He is pretty safe and not had any falls or injuries. His back on his  aspirin and tolerating it well. His blood pressure is well controlled and today in fact it is low at 107/69. Is tolerating Pravachol well  without muscle aches or pains.  Update virtual video visit 12/28/2018 :  Jack [name] is a 85 year old Caucasian male with past medical history of atrial fibrillation, benign prostatic hypertrophy, stroke, hyperlipidemia, hypertension, ventral hernia and right thalamic brain hemorrhage on anticoagulation in 2015 with mild residual left hemiparesis.  The patient's wife states that she is noticed increasing confusion and cognitive impairment and memory loss for the last several months.  This has been gradually progressive.  She denies any recent episodes suggestive of stroke or TIA.  She has noticed recent episodes of confusion.  A few few days ago the patient was unable to find his way in his own home though is lived there for 20 years.  He fell yesterday in his home and hurt his shoulder.  He is unable to do most basics task and gets confused and is unable to complete task and his wife has to do more and more things for him.  Patient has not had any recent evaluation for reversible causes of dementia.  He did have lab work done yesterday in the ER and CBC, UA and CMP were unremarkable.  CT scan of the head showed moderate changes of chronic microvascular ischemia and old bilateral infarcts involving right posterior watershed and left posterior watershed as well as basal ganglia.  No acute abnormality was noted.  Patient has been a patient in the practice and in the past has been seen for intracerebral hemorrhage in January 2015 and has A. fib and hence has not been on long-term anticoagulation.  He had a stroke in December 2017.  He had previously been enrolled in the [facility] 2 trial for blood pressure control for intracerebral hemorrhage in the past Update 02/21/2019 ; he returns for follow-up after last virtual video visit on 12/28/2018.  Continues to have memory and cognitive difficulties with her progress gradually over the last several months.  He has occasional episodes of confusion.  He did undergo MRI scan of  the brain which I ordered at last visit on 01/29/2019 which shows old lacunar infarcts as well as changes of small vessel disease which appear progressed compared to the previous MRI but there is no definite new acute infarct.  I had also ordered EEG and lab work for reversible causes of memory loss but these have not yet been done.  Patient remains on aspirin which is tolerating well without side effects.  He states blood pressures well controlled today it is 136/52.  Patient continues to have some residual left hand weakness and clumsiness.  He has no new complaints today.  REVIEW OF SYSTEMS: Full 14 system review of systems performed and notable only for those listed, all others are neg: Memory loss, confusion, weakness, decreased hearing all other systems negative    ALLERGIES: Allergies  Allergen Reactions   Sulfa Antibiotics Swelling    Swelling in groin area    HOME MEDICATIONS: Outpatient Medications Prior to Visit  Medication Sig Dispense Refill   amLODipine (NORVASC) 5 MG tablet Take 1 tablet (5 mg total) by mouth daily. 90 tablet 1   aspirin EC 81 MG tablet Take 1 tablet (81 mg total) by mouth daily.     Cholecalciferol (VITAMIN D3 PO) Take by mouth.     olmesartan (BENICAR) 40 MG tablet Take 1 tablet (40 mg total) by mouth daily. 90 tablet 1  tamsulosin (FLOMAX) 0.4 MG CAPS capsule Take 1 capsule (0.4 mg total) by mouth daily after supper. 30 capsule 5   sildenafil (VIAGRA) 100 MG tablet      No facility-administered medications prior to visit.     PAST MEDICAL HISTORY: Past Medical History:  Diagnosis Date   Acute CVA (cerebrovascular accident) ([place]) 07/05/2016   Atrial fibrillation ([place])    Dr. Tollie Eth   BPH (benign prostatic hypertrophy) 2012   prior therapy with finasteride, but as of 03/2015 no c/o; Dr. Sherrye Payor Kimbrough   Cellulitis of face 11/08   [facility]   CVA (cerebral infarction) 2009, 2015   Echocardiogram abnormal 02/11/2010   LVEF  70%, severe mitral regurgitation, pulm HTN    ED (erectile dysfunction)    Viagra prn, consult with Urology 2012   Elevated PSA 2012   BPH; Dr. Luanne Bras, [facility]; no more PSAs needed as of 2012 consult   Gait abnormality    uses cane, s/p CVA   Hemiparesis affecting left side as late effect of cerebrovascular accident ([place])    Hiatal hernia    per 2008 EUS   Hyperlipidemia    Hypertension    Microscopic hematuria    chronic, eval 2012 with Urology, benign at that time   Obstructive uropathy 2012   Dr. Sherrye Payor Kimbrough   Pancreatic mass 01/2006   inconsequential per CT 2007   Parapelvic renal cyst 01/2006   CT demonstrated inconsequential cyst   Patent foramen ovale    hx/o, notation of fistula on echocardiogram from 2009   Prophylactic antibiotic 2016   requires due to valve disease   Schatzki's ring 2008   per endoscopic ultrasound; Dr. Colan Neptune Buccini   Stroke [facility])    Ventral hernia    surgery consult 01/2010 with Dr. Excell Seltzer, elective repair if desired , but he declined   Wears hearing aid     PAST SURGICAL HISTORY: Past Surgical History:  Procedure Laterality Date   CARDIAC CATHETERIZATION  02/2010   LVEF 70%, severe mitral regurgitation, pulm HTN; prior in 01/2006   [place]  01/2006   mitral repair with Maze procedure, tricuspid annuloplasty, MAZE and LAA oversewn by Dr. Roxy Manns    FAMILY HISTORY: Family History  Family history unknown: Yes    SOCIAL HISTORY: Social History   Socioeconomic History   Marital status: Married    Spouse name: Equities trader   Number of children: Not on file   Years of education: Not on file   Highest education level: Not on file  Occupational History   Not on file  Social Needs   Financial resource strain: Not on file   Food insecurity    Worry: Not on file    Inability: Not on file   Transportation needs     Medical: Not on file    Non-medical: Not on file  Tobacco Use   Smoking status: Never Smoker   Smokeless tobacco: Never Used  Substance and Sexual Activity   Alcohol use: No   Drug use: No   Sexual activity: Not on file  Lifestyle   Physical activity    Days per week: Not on file    Minutes per session: Not on file   Stress: Not on file  Relationships   Social connections    Talks on phone: Not on file    Gets together: Not on file    Attends  religious service: Not on file    Active member of club or organization: Not on file    Attends meetings of clubs or organizations: Not on file    Relationship status: Not on file   Intimate partner violence    Fear of current or ex partner: Not on file    Emotionally abused: Not on file    Physically abused: Not on file    Forced sexual activity: Not on file  Other Topics Concern   Not on file  Social History Narrative   Married, exercises with stationary bike 5 days per week.   Eats healthy.  As of 03/2015     PHYSICAL EXAM  Vitals:   02/21/19 1339  BP: (!) 136/52  Pulse: 86  Temp: 98.4 F (36.9 C)  Weight: 63.5 kg   Body mass index is 25.61 kg/m.  Generalized: Well developed, elderly Caucasian male, in no acute distress  Head: normocephalic and atraumatic,. Oropharynx benign  Neck: Supple, no carotid bruits  Cardiac: Regular rate rhythm, no murmur  Musculoskeletal: No deformity   Neurological examination   Mentation: Alert oriented to time, place, history taking. Attention span and concentration appropriate. Recent and remote memory poor Follows all commands speech and language fluent.  Diminished recall 0/3.  Able to name only 7 animals which walk on 4 legs.  Clock drawing 3/4.  Cranial nerve II-XII: Pupils were equal round reactive to light extraocular movements were full, visual field were full on confrontational test. Facial sensation normal with a slight left-sided asymmetry. hearing was intact to finger  rubbing bilaterally. Uvula tongue midline. head turning and shoulder shrug were normal and symmetric.Tongue protrusion into cheek strength was normal. Motor: normal bulk and tone, full strength in the BLE and LUE. Moderate weakness of left bicep, left grip and intrinsic hand muscles . Mild weakness of left ankle dorsiflexors  Sensory: Slight decrease in sensation on left upper and lower extremity Coordination: finger-nose-finger, heel-to-shin bilaterally, no dysmetria Reflexes: Brisk reflexes noted in all extremities Gait and Station: Rising up from seated position without assistance, normal stance, slow stride with a stiff left leg,, unable to heel toe or tandem walk. Ambulates with single-point cane   DIAGNOSTIC DATA (LABS, IMAGING, TESTING) - I reviewed patient records, labs, notes, testing and imaging myself where available.  Lab Results  Component Value Date   WBC 11.6 (H) 12/27/2018   HGB 12.9 (L) 12/27/2018   HCT 39.0 12/27/2018   MCV 90.1 12/27/2018   PLT 267 12/27/2018      Component Value Date/Time   NA 141 01/26/2019 1434   K 4.0 01/26/2019 1434   CL 104 01/26/2019 1434   CO2 29 01/26/2019 1434   GLUCOSE 99 01/26/2019 1434   BUN 16 01/26/2019 1434   CREATININE 0.87 01/26/2019 1434   CALCIUM 9.0 01/26/2019 1434   PROT 7.0 12/27/2018 1035   ALBUMIN 3.7 12/27/2018 1035   AST 19 12/27/2018 1035   ALT 14 12/27/2018 1035   ALKPHOS 36 (L) 12/27/2018 1035   BILITOT 1.3 (H) 12/27/2018 1035   GFRNONAA >60 12/27/2018 1035   GFRAA >60 12/27/2018 1035   Lab Results  Component Value Date   CHOL 151 03/10/2017   HDL 30.40 (L) 03/10/2017   LDLCALC 92 03/10/2017   TRIG 143.0 03/10/2017   CHOLHDL 5 03/10/2017   Lab Results  Component Value Date   HGBA1C 5.8 (H) 01/26/2019   No results found for: [id number] Lab Results  Component Value Date   TSH 1.446  Test methodology is 3rd generation TSH 12/12/2007      ASSESSMENT AND PLAN 69 year caucasian male with cardiac  embolic left frontal MCA branch infarct in December 2017 secondary to atrial fibrillation not on anticoagulation due to history of right thalamic intracerebral hemorrhage in January 2015 with mild left hemiparesis.  Vascular risk factors include HTN, HLD, and atrial fibrillation.  Recent subacute worsening memory and cognition possibly due to mixed vascular and Alzheimer's dementia    PLAN:  I had a long discussion with the patient and his wife regarding his progressive cognitive decline which is likely due to dementia Trial of aricept 5 mg daily x 1 month and increase to 10 mg daily iof tolerated. Increase participation in cognitively challenging activities like playing sudoku, bridge and crossword puzzles. We also discussed memory compensation strategies. Check dementia panel labs and EEG.Continue aspirin 81 mg daily for stroke prevention.  Maintain strict control of hypertension with blood pressure goal below 130/90, lipids with LDL cholesterol goal below 70 mg percent.  Return for f/u in 3 months with Janett Billow my nurse practitioner or call earlier if needed  I spent 25 minutes in total face to face time with the patient more than 50% of which was spent counseling and coordination of care, reviewing test results reviewing medications and discussing and reviewing the diagnosis of stroke and management of risk factors   Antony Contras, MD [facility] [address] [place], [place] [address] [phone]

## 2019-02-21 NOTE — Patient Instructions (Addendum)
I had a long discussion with the patient and his wife regarding his progressive cognitive decline which is likely due to dementia Trial of aricept 5 mg daily x 1 month and increase to 10 mg daily iof tolerated. Increase participation in cognitively challenging activities like playing sudoku, bridge and crossword puzzles. We also discussed memory compensation strategies. Check dementia panel labs and EEG.Continue aspirin 81 mg daily for stroke prevention.  Maintain strict control of hypertension with blood pressure goal below 130/90, lipids with LDL cholesterol goal below 70 mg percent.  Return for f/u in 3 months with Janett Billow my nurse practitioner or call earlier if needed . Memory Compensation Strategies  1. Use "WARM" strategy.  W= write it down  A= associate it  R= repeat it  M= make a mental note  2.   You can keep a Social worker.  Use a 3-ring notebook with sections for the following: calendar, important names and phone numbers,  medications, doctors' names/phone numbers, lists/reminders, and a section to journal what you did  each day.   3.    Use a calendar to write appointments down.  4.    Write yourself a schedule for the day.  This can be placed on the calendar or in a separate section of the Memory Notebook.  Keeping a  regular schedule can help memory.  5.    Use medication organizer with sections for each day or morning/evening pills.  You may need help loading it  6.    Keep a basket, or pegboard by the door.  Place items that you need to take out with you in the basket or on the pegboard.  You may also want to  include a message board for reminders.  7.    Use sticky notes.  Place sticky notes with reminders in a place where the task is performed.  For example: " turn off the  stove" placed by the stove, "lock the door" placed on the door at eye level, " take your medications" on  the bathroom mirror or by the place where you normally take your medications.  8.    Use  alarms/timers.  Use while cooking to remind yourself to check on food or as a reminder to take your medicine, or as a  reminder to make a call, or as a reminder to perform another task, etc.

## 2019-02-22 LAB — DEMENTIA PANEL
Homocysteine: 13.3 umol/L (ref 0.0–21.3)
RPR Ser Ql: NONREACTIVE
TSH: 0.977 u[IU]/mL (ref 0.450–4.500)
Vitamin B-12: 315 pg/mL (ref 232–1245)

## 2019-02-23 ENCOUNTER — Ambulatory Visit (INDEPENDENT_AMBULATORY_CARE_PROVIDER_SITE_OTHER): Payer: Medicare Other | Admitting: Nurse Practitioner

## 2019-02-23 ENCOUNTER — Encounter: Payer: Self-pay | Admitting: Nurse Practitioner

## 2019-02-23 ENCOUNTER — Other Ambulatory Visit: Payer: Self-pay

## 2019-02-23 ENCOUNTER — Ambulatory Visit (INDEPENDENT_AMBULATORY_CARE_PROVIDER_SITE_OTHER): Payer: Medicare Other

## 2019-02-23 VITALS — BP 176/80 | HR 102 | Temp 98.5°F | Ht 62.0 in | Wt 143.2 lb

## 2019-02-23 DIAGNOSIS — R2689 Other abnormalities of gait and mobility: Secondary | ICD-10-CM | POA: Diagnosis not present

## 2019-02-23 DIAGNOSIS — I10 Essential (primary) hypertension: Secondary | ICD-10-CM | POA: Diagnosis not present

## 2019-02-23 DIAGNOSIS — R531 Weakness: Secondary | ICD-10-CM

## 2019-02-23 DIAGNOSIS — J209 Acute bronchitis, unspecified: Secondary | ICD-10-CM | POA: Diagnosis not present

## 2019-02-23 DIAGNOSIS — R296 Repeated falls: Secondary | ICD-10-CM

## 2019-02-23 DIAGNOSIS — R0989 Other specified symptoms and signs involving the circulatory and respiratory systems: Secondary | ICD-10-CM

## 2019-02-23 DIAGNOSIS — J42 Unspecified chronic bronchitis: Secondary | ICD-10-CM

## 2019-02-23 DIAGNOSIS — R350 Frequency of micturition: Secondary | ICD-10-CM

## 2019-02-23 DIAGNOSIS — N401 Enlarged prostate with lower urinary tract symptoms: Secondary | ICD-10-CM

## 2019-02-23 MED ORDER — AMLODIPINE BESYLATE 5 MG PO TABS: 5.0000 mg | ORAL_TABLET | Freq: Every day | ORAL | 1 refills | Status: DC

## 2019-02-23 MED ORDER — TAMSULOSIN HCL 0.4 MG PO CAPS: 0.4000 mg | ORAL_CAPSULE | Freq: Every day | ORAL | 1 refills | Status: DC

## 2019-02-23 MED ORDER — OLMESARTAN MEDOXOMIL 40 MG PO TABS: 40.0000 mg | ORAL_TABLET | Freq: Every day | ORAL | 1 refills | Status: DC

## 2019-02-23 MED ORDER — ALBUTEROL SULFATE HFA 108 (90 BASE) MCG/ACT IN AERS: 1.0000 | INHALATION_SPRAY | Freq: Four times a day (QID) | RESPIRATORY_TRACT | 1 refills | Status: DC | PRN

## 2019-02-23 NOTE — Progress Notes (Signed)
Subjective:  Patient ID: Jack [name], male    DOB: 1934-04-08  Age: 85 y.o. MRN: [id number]  CC: Follow-up (follow up on BP--med refill consult?) Accompanied by ms. Montalban  Shortness of Breath This is a recurrent problem. The current episode started in the past 7 days. The problem occurs intermittently. The problem has been waxing and waning. Associated symptoms include wheezing. Pertinent negatives include no chest pain, fever, headaches, leg swelling, orthopnea, PND, rhinorrhea, sore throat, sputum production or swollen glands. The symptoms are aggravated by any activity. The patient has no known risk factors for DVT/PE. He has tried nothing for the symptoms. There is no history of allergies, asthma or pneumonia.   HTN: Uncontrolled due to discrepancy in medications.  Wife reports he has not been taking olmesartan because she was unable to get refill, he does not take amlodipine daily either. BP Readings from Last 3 Encounters:  02/23/19 (!) 176/80  02/21/19 (!) 136/52  01/26/19 (!) 166/88   Wife reports difficulty with ADLs despite use of walker. Hx of frequent falls. She reports difficulty in convincing Jack [name] to take medications daily.  Reviewed past Medical, Social and Family history today.  Outpatient Medications Prior to Visit  Medication Sig Dispense Refill  . aspirin EC 81 MG tablet Take 1 tablet (81 mg total) by mouth daily.    . Cholecalciferol (VITAMIN D3 PO) Take by mouth.    Marland Kitchen amLODipine (NORVASC) 5 MG tablet Take 1 tablet (5 mg total) by mouth daily. 90 tablet 1  . donepezil (ARICEPT) 10 MG tablet Take 1 tablet (10 mg total) by mouth at bedtime. Start 1/2 tablet daily x 1 month and then 1 tablet daily (Patient not taking: Reported on 02/23/2019) 30 tablet 2  . olmesartan (BENICAR) 40 MG tablet Take 1 tablet (40 mg total) by mouth daily. (Patient not taking: Reported on 02/23/2019) 90 tablet 1  . tamsulosin (FLOMAX) 0.4 MG CAPS capsule Take 1 capsule (0.4 mg total) by  mouth daily after supper. (Patient not taking: Reported on 02/23/2019) 30 capsule 5   No facility-administered medications prior to visit.     ROS See HPI  Objective:  BP (!) 176/80   Pulse (!) 102   Temp 98.5 F (36.9 C) (Oral)   Ht 5\' 2"  (1.575 m)   Wt 143 lb 3.2 oz (65 kg)   SpO2 98%   BMI 26.19 kg/m   BP Readings from Last 3 Encounters:  02/23/19 (!) 176/80  02/21/19 (!) 136/52  01/26/19 (!) 166/88    Wt Readings from Last 3 Encounters:  02/23/19 143 lb 3.2 oz (65 kg)  02/21/19 140 lb (63.5 kg)  01/26/19 140 lb 12.8 oz (63.9 kg)    Physical Exam Vitals signs reviewed.  Cardiovascular:     Rate and Rhythm: Normal rate. Rhythm irregular.     Pulses: Normal pulses.     Heart sounds: Normal heart sounds.  Pulmonary:     Effort: Pulmonary effort is normal. No respiratory distress.     Breath sounds: No stridor. Wheezing and rales present. No rhonchi.  Chest:     Chest wall: No tenderness.  Musculoskeletal:     Right lower leg: Edema present.     Left lower leg: Edema present.  Neurological:     Mental Status: He is alert. He is disoriented.     Lab Results  Component Value Date   WBC 11.6 (H) 12/27/2018   HGB 12.9 (L) 12/27/2018   HCT 39.0 12/27/2018  PLT 267 12/27/2018   GLUCOSE 99 01/26/2019   CHOL 151 03/10/2017   TRIG 143.0 03/10/2017   HDL 30.40 (L) 03/10/2017   LDLCALC 92 03/10/2017   ALT 14 12/27/2018   AST 19 12/27/2018   NA 141 01/26/2019   K 4.0 01/26/2019   CL 104 01/26/2019   CREATININE 0.87 01/26/2019   BUN 16 01/26/2019   CO2 29 01/26/2019   TSH 0.977 02/21/2019   PSA 5.1 (H) 01/26/2019   INR 1.2 12/27/2018   HGBA1C 5.8 (H) 01/26/2019    Mr Brain W Wo Contrast  Result Date: 01/29/2019  [facility] [address], [place], [place] [address] [phone] NEUROIMAGING REPORT STUDY DATE: 01/29/2019 PATIENT NAME: Jack [name] DOB: 1934/03/09 MRN: [id number] EXAM: MRI Brain with and without contrast ORDERING  CLINICIAN: Antony Contras, MD CLINICAL HISTORY: 85 year old man with dementia and confusion COMPARISON FILMS: MRI 07/04/2016; CT 12/27/2018; MRI 12/12/2007 TECHNIQUE:MRI of the brain with and without contrast was obtained utilizing 5 mm axial slices with T1, T2, T2 flair, SWI and diffusion weighted views.  T1 sagittal, T2 coronal and postcontrast views in the axial and coronal plane were obtained. CONTRAST: 12 ml Multihance IMAGING SITE: [facility], [place]. FINDINGS: On sagittal images, the spinal cord is imaged caudally to C4-C5 and is normal in caliber.   The contents of the posterior fossa are of normal size and position.   The pituitary gland and optic chiasm appear normal.   There is moderate generalized cortical atrophy, a little more pronounced in the mesial temporal lobes and unchanged compared to the 12/27/2018 CT scan but mildly progressed compared to the 2017 MRI.  Corpus callosum atrophy is also noted.  There are no abnormal extra-axial collections of fluid.  There are very extensive confluent T2/flair hyperintense changes in the white matter of both hemispheres.  The largest confluencies are in the parieto-occipital region.  This is similar to the CT scan this year but does show some progression compared to the MRI from 2017 and severe progression compared to the MRI from 2009.  There are lacunar infarctions in the left cerebral hemisphere.   The cerebellar infarctions were present on the recent CT scan but one in the superior cerebellar arterial distribution was not present on the 2017 MRI.  Diffusion weighted images are normal.  Susceptibility weighted images show chronic heme products in the white matter adjacent to the right thalamus and basal ganglia.  There is also microhemorrhage in the right parietal lobe.  Of note, T2*gradient echo heme weighted images from 2009 did not show chronic heme products. The orbits appear normal.   The VIIth/VIIIth nerve complex appears normal.  The  mastoid air cells appear normal.  The paranasal sinuses appear normal.  Flow voids are identified within the major intracerebral arteries.  After the infusion of contrast material, a normal enhancement pattern is noted.   This MRI of the brain with and without contrast shows the following: 1.   Moderate generalized cortical atrophy, a little more pronounced in the mesial temporal lobes and the insular regions.  This has progressed compared to the 2017 MRI. 2.   Severe white matter T2 hyperintensity consistent with severe chronic microvascular ischemic changes, progressed compared to the 2017 MRI and markedly progressed compared to the 2009 MRI. 3.   Chronic heme products in the white matter adjacent to the thalamus and basal ganglia on the right and a chronic microhemorrhage in the right parietal lobe.  These changes  were not apparent on the 2009 MRI.  The 2017 MRI did not include susceptibility weighted imaging.     Additionally, there are lacunar infarctions in the cerebellum, one that has occurred since the 2017 MRI. 4.   There is a normal enhancement pattern and no acute findings. INTERPRETING PHYSICIAN: Richard A. Felecia Shelling, MD, PhD, FAAN Certified in  Neuroimaging by [employer]    Assessment & Plan:  Reviewed medication list and time of administration with Ms. Chabot. Advised her to put medication in daily dispenser and supervise to ensure medication is taken. I advised her about possible adverse effects as a result of uncontrolled BP (another stroke or MI)  Jack [name] was seen today for follow-up.  Diagnoses and all orders for this visit:  Chronic bronchitis with acute exacerbation (HCC) -     DG Chest 2 View -     albuterol (VENTOLIN HFA) 108 (90 Base) MCG/ACT inhaler; Inhale 1 puff into the lungs every 6 (six) hours as needed for wheezing or shortness of breath.  Essential hypertension -     amLODipine (NORVASC) 5 MG tablet; Take 1 tablet (5 mg total) by mouth daily. -      olmesartan (BENICAR) 40 MG tablet; Take 1 tablet (40 mg total) by mouth daily.  Weakness generalized -     Ambulatory referral to Home Health  Impaired gait and mobility -     Ambulatory referral to [place]  Frequent falls -     Ambulatory referral to [place]  Benign prostatic hyperplasia with urinary frequency -     tamsulosin (FLOMAX) 0.4 MG CAPS capsule; Take 1 capsule (0.4 mg total) by mouth daily after supper.   I am having Jack [name] start on albuterol. I am also having him maintain his Cholecalciferol (VITAMIN D3 PO), aspirin EC, donepezil, amLODipine, olmesartan, and tamsulosin.  Meds ordered this encounter  Medications  . amLODipine (NORVASC) 5 MG tablet    Sig: Take 1 tablet (5 mg total) by mouth daily.    Dispense:  90 tablet    Refill:  1    Order Specific Question:   Supervising Provider    Answer:   MATTHEWS, CODY [[id number]]  . olmesartan (BENICAR) 40 MG tablet    Sig: Take 1 tablet (40 mg total) by mouth daily.    Dispense:  90 tablet    Refill:  1    Order Specific Question:   Supervising Provider    Answer:   MATTHEWS, CODY [[id number]]  . tamsulosin (FLOMAX) 0.4 MG CAPS capsule    Sig: Take 1 capsule (0.4 mg total) by mouth daily after supper.    Dispense:  90 capsule    Refill:  1    Order Specific Question:   Supervising Provider    Answer:   MATTHEWS, CODY [[id number]]  . albuterol (VENTOLIN HFA) 108 (90 Base) MCG/ACT inhaler    Sig: Inhale 1 puff into the lungs every 6 (six) hours as needed for wheezing or shortness of breath.    Dispense:  6.7 g    Refill:  1    Order Specific Question:   Supervising Provider    Answer:   MATTHEWS, CODY [[id number]]    Problem List Items Addressed This Visit      Cardiovascular and Mediastinum   Essential hypertension   Relevant Medications   amLODipine (NORVASC) 5 MG tablet   olmesartan (BENICAR) 40 MG tablet     Genitourinary   Benign prostatic hyperplasia (Chronic)  Relevant Medications   tamsulosin (FLOMAX)  0.4 MG CAPS capsule     Other   Impaired gait and mobility   Relevant Orders   Ambulatory referral to [place]    Other Visit Diagnoses    Chronic bronchitis with acute exacerbation ([place])    -  Primary   Relevant Medications   albuterol (VENTOLIN HFA) 108 (90 Base) MCG/ACT inhaler   Other Relevant Orders   DG Chest 2 View (Completed)   Weakness generalized       Relevant Orders   Ambulatory referral to [place]   Frequent falls       Relevant Orders   Ambulatory referral to [place]       Follow-up: Return in about 3 months (around 05/26/2019) for HTN.  Wilfred Lacy, NP

## 2019-02-23 NOTE — Patient Instructions (Addendum)
CXR: No acute finding  Albuterol sent to be used as needed for SOB and wheezing  Resume olmesartan. Take 1dose this evening.  Maintain appt with dermatology  You will be contacted to schedule appt with home health.

## 2019-02-26 ENCOUNTER — Encounter: Payer: Self-pay | Admitting: Nurse Practitioner

## 2019-03-08 ENCOUNTER — Telehealth: Payer: Self-pay | Admitting: Nurse Practitioner

## 2019-03-08 NOTE — Telephone Encounter (Signed)
fyi

## 2019-03-08 NOTE — Telephone Encounter (Signed)
Mo with [facility] reporting a delayed start of care.  Pt has not responded to any of her calls.   cb [phone]

## 2019-03-08 NOTE — Telephone Encounter (Signed)
Can you please try to call Ms Wenzinger?

## 2019-03-09 NOTE — Telephone Encounter (Signed)
Spoke with Mr. Amstutz spouse, gave her number to Mo--pt will call Mo back.

## 2019-03-12 ENCOUNTER — Telehealth: Payer: Self-pay | Admitting: Nurse Practitioner

## 2019-03-12 NOTE — Telephone Encounter (Signed)
Pete calling from [facility] and would like verbals PT 2x2 1x2.    Cb# [phone]

## 2019-03-12 NOTE — Telephone Encounter (Signed)
Please advise 

## 2019-03-12 NOTE — Telephone Encounter (Signed)
ok 

## 2019-03-12 NOTE — Telephone Encounter (Signed)
Unable to leave vm again.  

## 2019-03-12 NOTE — Telephone Encounter (Signed)
Laurey Arrow is aware.

## 2019-03-12 NOTE — Telephone Encounter (Signed)
Unable to leave vm, will cal back later.

## 2019-03-12 NOTE — Telephone Encounter (Signed)
Mo is aware.

## 2019-03-12 NOTE — Telephone Encounter (Signed)
[facility] ([place]) called for a skilled nursing request with pt for 1 time a week for 4 weeks. pls call back at [phone]

## 2019-03-13 ENCOUNTER — Telehealth: Payer: Self-pay | Admitting: Nurse Practitioner

## 2019-03-13 NOTE — Telephone Encounter (Signed)
OT is aware.   Copied from [place] [phone]. Topic: General - Other >> Mar 13, 2019  4:33 PM Rainey Pines A wrote: [facility] occupational therapist called for verbal orders for OT 2w2. He can be reached at [phone]

## 2019-03-14 NOTE — Telephone Encounter (Signed)
ok 

## 2019-03-14 NOTE — Telephone Encounter (Signed)
LVM for OT to call back, need to inform [facility] response.

## 2019-03-22 ENCOUNTER — Telehealth: Payer: Self-pay

## 2019-03-22 ENCOUNTER — Telehealth: Payer: Self-pay | Admitting: Nurse Practitioner

## 2019-03-22 ENCOUNTER — Other Ambulatory Visit: Payer: Self-pay

## 2019-03-22 NOTE — Telephone Encounter (Signed)
Jack [name] (PT from [facility]) call stated that Jack [name] BP is elevated today 162/81. Jack [name] going to hold any activity for today, pt took his BP med about an hour ago, the nurse will come out again today around 12:30pm to recheck the pt.   Dr. Zigmund Daniel please advise in absence of [name] today.

## 2019-03-22 NOTE — Telephone Encounter (Signed)
Pt scheduled for tomorrow at 11

## 2019-03-22 NOTE — Telephone Encounter (Signed)
Copied from [place] [phone]. Topic: General - Other >> Mar 22, 2019  1:53 PM Rainey Pines A wrote: Mardene Celeste from [facility] called to inform provider that patient had elevated BP at 166/76. Patient does not have any signs or symptoms of stroke. Patient also has wheezing even after using his ventolin inhaler.Oxygen is 96%.  Mardene Celeste can be reached at [phone]

## 2019-03-22 NOTE — Telephone Encounter (Signed)
Will await readings from 1230 to see if this has improved.

## 2019-03-22 NOTE — Telephone Encounter (Signed)
Can you schedule him with Jack [name] tomorrow? I spoke with [facility] and they will notify the wife to be expecting a call.

## 2019-03-22 NOTE — Telephone Encounter (Signed)
Spoke with the pt's spouse, she going to call back once the nurse recheck BP.

## 2019-03-22 NOTE — Telephone Encounter (Signed)
Schedule to see [facility] tomorrow.

## 2019-03-23 ENCOUNTER — Encounter: Payer: Self-pay | Admitting: Nurse Practitioner

## 2019-03-23 ENCOUNTER — Ambulatory Visit (INDEPENDENT_AMBULATORY_CARE_PROVIDER_SITE_OTHER): Payer: Medicare Other | Admitting: Nurse Practitioner

## 2019-03-23 DIAGNOSIS — I10 Essential (primary) hypertension: Secondary | ICD-10-CM

## 2019-03-23 MED ORDER — AMLODIPINE BESYLATE 5 MG PO TABS: 5.0000 mg | ORAL_TABLET | Freq: Two times a day (BID) | ORAL | 1 refills | Status: DC

## 2019-03-23 NOTE — Progress Notes (Signed)
Subjective:  Patient ID: Jack [name], male    DOB: Aug 12, 1933  Age: 85 y.o. MRN: [id number]  CC: Follow-up (follow up on BP--high BP reading at home 173/104 and 158/108--flu shot tdap consult?)  HPI  HTN: Accompanied by wife. Reports compliance with medications, but persistent elevation in BP Denies any headache or dizziness or chest pain or palpitations or change in vision or acute focal weakness. BP Readings from Last 3 Encounters:  03/23/19 (!) 158/86  02/23/19 (!) 176/80  02/21/19 (!) 136/52   Reviewed past Medical, Social and Family history today.  Outpatient Medications Prior to Visit  Medication Sig Dispense Refill   albuterol (VENTOLIN HFA) 108 (90 Base) MCG/ACT inhaler Inhale 1 puff into the lungs every 6 (six) hours as needed for wheezing or shortness of breath. 6.7 g 1   aspirin EC 81 MG tablet Take 1 tablet (81 mg total) by mouth daily.     Cholecalciferol (VITAMIN D3 PO) Take by mouth.     donepezil (ARICEPT) 10 MG tablet Take 1 tablet (10 mg total) by mouth at bedtime. Start 1/2 tablet daily x 1 month and then 1 tablet daily 30 tablet 2   olmesartan (BENICAR) 40 MG tablet Take 1 tablet (40 mg total) by mouth daily. 90 tablet 1   tamsulosin (FLOMAX) 0.4 MG CAPS capsule Take 1 capsule (0.4 mg total) by mouth daily after supper. 90 capsule 1   amLODipine (NORVASC) 5 MG tablet Take 1 tablet (5 mg total) by mouth daily. 90 tablet 1   No facility-administered medications prior to visit.     ROS See HPI  Objective:  BP (!) 158/86    Pulse (!) 104    Temp 98.7 F (37.1 C) (Tympanic)    Ht 5\' 2"  (1.575 m)    Wt 144 lb 14.4 oz (65.7 kg)    SpO2 98%    BMI 26.50 kg/m   BP Readings from Last 3 Encounters:  03/23/19 (!) 158/86  02/23/19 (!) 176/80  02/21/19 (!) 136/52    Wt Readings from Last 3 Encounters:  03/23/19 144 lb 14.4 oz (65.7 kg)  02/23/19 143 lb 3.2 oz (65 kg)  02/21/19 140 lb (63.5 kg)    Physical Exam Vitals signs reviewed.  Eyes:   Extraocular Movements: Extraocular movements intact.     Conjunctiva/sclera: Conjunctivae normal.  Neck:     Musculoskeletal: Normal range of motion.  Cardiovascular:     Rate and Rhythm: Normal rate and regular rhythm.     Pulses: Normal pulses.     Heart sounds: Murmur present.  Pulmonary:     Breath sounds: Normal breath sounds.  Musculoskeletal:     Right lower leg: No edema.     Left lower leg: No edema.  Neurological:     Mental Status: He is alert and oriented to person, place, and time.  Psychiatric:        Mood and Affect: Mood normal.        Behavior: Behavior normal.     Lab Results  Component Value Date   WBC 11.6 (H) 12/27/2018   HGB 12.9 (L) 12/27/2018   HCT 39.0 12/27/2018   PLT 267 12/27/2018   GLUCOSE 99 01/26/2019   CHOL 151 03/10/2017   TRIG 143.0 03/10/2017   HDL 30.40 (L) 03/10/2017   LDLCALC 92 03/10/2017   ALT 14 12/27/2018   AST 19 12/27/2018   NA 141 01/26/2019   K 4.0 01/26/2019   CL 104 01/26/2019  CREATININE 0.87 01/26/2019   BUN 16 01/26/2019   CO2 29 01/26/2019   TSH 0.977 02/21/2019   PSA 5.1 (H) 01/26/2019   INR 1.2 12/27/2018   HGBA1C 5.8 (H) 01/26/2019    Mr Brain W [id number] Contrast  Result Date: 01/29/2019  [facility] [address] [place], [place] [address] [phone] NEUROIMAGING REPORT STUDY DATE: 01/29/2019 PATIENT NAME: Jack [name] DOB: 22-Jul-1933 MRN: [id number] EXAM: MRI Brain with and without contrast ORDERING CLINICIAN: Antony Contras, MD CLINICAL HISTORY: 85 year old man with dementia and confusion COMPARISON FILMS: MRI 07/04/2016; CT 12/27/2018; MRI 12/12/2007 TECHNIQUE:MRI of the brain with and without contrast was obtained utilizing 5 mm axial slices with T1, T2, T2 flair, SWI and diffusion weighted views.  T1 sagittal, T2 coronal and postcontrast views in the axial and coronal plane were obtained. CONTRAST: 12 ml Multihance IMAGING SITE: [facility], [place]. FINDINGS: On sagittal  images, the spinal cord is imaged caudally to C4-C5 and is normal in caliber.   The contents of the posterior fossa are of normal size and position.   The pituitary gland and optic chiasm appear normal.   There is moderate generalized cortical atrophy, a little more pronounced in the mesial temporal lobes and unchanged compared to the 12/27/2018 CT scan but mildly progressed compared to the 2017 MRI.  Corpus callosum atrophy is also noted.  There are no abnormal extra-axial collections of fluid.  There are very extensive confluent T2/flair hyperintense changes in the white matter of both hemispheres.  The largest confluencies are in the parieto-occipital region.  This is similar to the CT scan this year but does show some progression compared to the MRI from 2017 and severe progression compared to the MRI from 2009.  There are lacunar infarctions in the left cerebral hemisphere.   The cerebellar infarctions were present on the recent CT scan but one in the superior cerebellar arterial distribution was not present on the 2017 MRI.  Diffusion weighted images are normal.  Susceptibility weighted images show chronic heme products in the white matter adjacent to the right thalamus and basal ganglia.  There is also microhemorrhage in the right parietal lobe.  Of note, T2*gradient echo heme weighted images from 2009 did not show chronic heme products. The orbits appear normal.   The VIIth/VIIIth nerve complex appears normal.  The mastoid air cells appear normal.  The paranasal sinuses appear normal.  Flow voids are identified within the major intracerebral arteries.  After the infusion of contrast material, a normal enhancement pattern is noted.   This MRI of the brain with and without contrast shows the following: 1.   Moderate generalized cortical atrophy, a little more pronounced in the mesial temporal lobes and the insular regions.  This has progressed compared to the 2017 MRI. 2.   Severe white matter T2  hyperintensity consistent with severe chronic microvascular ischemic changes, progressed compared to the 2017 MRI and markedly progressed compared to the 2009 MRI. 3.   Chronic heme products in the white matter adjacent to the thalamus and basal ganglia on the right and a chronic microhemorrhage in the right parietal lobe.  These changes were not apparent on the 2009 MRI.  The 2017 MRI did not include susceptibility weighted imaging.     Additionally, there are lacunar infarctions in the cerebellum, one that has occurred since the 2017 MRI. 4.   There is a normal enhancement pattern and no acute findings. INTERPRETING PHYSICIAN: Richard A. Felecia Shelling, MD,  PhD, FAAN Certified in  Neuroimaging by [employer]:   [place] was seen today for follow-up.  Diagnoses and all orders for this visit:  Essential hypertension -     amLODipine (NORVASC) 5 MG tablet; Take 1 tablet (5 mg total) by mouth 2 (two) times daily.   I have changed Zakye T. Bias's amLODipine. I am also having him maintain his Cholecalciferol (VITAMIN D3 PO), aspirin EC, donepezil, olmesartan, tamsulosin, and albuterol.  Meds ordered this encounter  Medications   amLODipine (NORVASC) 5 MG tablet    Sig: Take 1 tablet (5 mg total) by mouth 2 (two) times daily.    Dispense:  180 tablet    Refill:  1    Order Specific Question:   Supervising Provider    Answer:   MATTHEWS, CODY [[id number]]    Problem List Items Addressed This Visit      Cardiovascular and Mediastinum   Essential hypertension   Relevant Medications   amLODipine (NORVASC) 5 MG tablet      Follow-up: Return in about 4 weeks (around 04/20/2019) for HTN.  Wilfred Lacy, NP

## 2019-03-23 NOTE — Patient Instructions (Addendum)
Increase amlodipine dose to 5mg  twice a day. Start tonight Check Bp at home in Morning and record. Bring BP readings to next office visit. Call office sooner if BP persistently >150/90.

## 2019-03-23 NOTE — Telephone Encounter (Signed)
Pt has an appt with [facility] today.

## 2019-03-27 DIAGNOSIS — R350 Frequency of micturition: Secondary | ICD-10-CM

## 2019-03-27 DIAGNOSIS — N401 Enlarged prostate with lower urinary tract symptoms: Secondary | ICD-10-CM

## 2019-03-27 DIAGNOSIS — J42 Unspecified chronic bronchitis: Secondary | ICD-10-CM

## 2019-03-27 DIAGNOSIS — F0390 Unspecified dementia without behavioral disturbance: Secondary | ICD-10-CM

## 2019-03-27 DIAGNOSIS — R296 Repeated falls: Secondary | ICD-10-CM

## 2019-03-27 DIAGNOSIS — R2681 Unsteadiness on feet: Secondary | ICD-10-CM

## 2019-03-27 DIAGNOSIS — I10 Essential (primary) hypertension: Secondary | ICD-10-CM

## 2019-03-27 DIAGNOSIS — R531 Weakness: Secondary | ICD-10-CM | POA: Diagnosis not present

## 2019-04-04 ENCOUNTER — Ambulatory Visit: Payer: Self-pay | Admitting: Nurse Practitioner

## 2019-04-04 ENCOUNTER — Telehealth: Payer: Self-pay | Admitting: Nurse Practitioner

## 2019-04-04 NOTE — Telephone Encounter (Signed)
Pete with [facility] states pt has not met his goals for home health PT. Laurey Arrow would like to extend home health PT  2 wk 1 1 wk 2  cb [phone]

## 2019-04-04 NOTE — Telephone Encounter (Signed)
Please advise 

## 2019-04-04 NOTE — Telephone Encounter (Signed)
Ok to extend

## 2019-04-04 NOTE — Telephone Encounter (Signed)
Jack [name] is aware.

## 2019-04-04 NOTE — Telephone Encounter (Signed)
'  Mo' RN from [facility] calling. States pt has rhonchi throughout fields but "No worse than last week, no change." States pt denies SOB, VSS, O2 sat 95% on RA. States pt does not use inhalers as ordered; education provided. She is requesting in home CXR from mobile unit.  States if appropriate please FAX order to [phone]. States please specify type.  Nurses CB # if needed [phone]

## 2019-04-04 NOTE — Telephone Encounter (Signed)
Mo is aware of [name] message below. She mention of edema on lower leg and old medications of : furosemide 20 mg, Colace 100 mg, Hydrochlorothiazide 25 mg and tessalon cough med 100 mg PRN.   She was wondering if [name] wants him to be one any of those old med because it is not his med list. If so we need to send in new rx and FYI--pt have not see his cardiologist in years.    She is plan on going to visit him again on Saturday 04/07/2019.  His vital sign today is 136/70 BP and 97.6 temp (do not remember the # of pulse rate)

## 2019-04-04 NOTE — Telephone Encounter (Signed)
He had CXR completed 02/23/2019 which indicated chronic bronchitis and Insterstitial lung disease. I recommended he use albuterol inhaler BID for next 2days. if no improvement, have him schedule F2F appt with me. I will also like to know his latest vital sign including temperature if possible

## 2019-04-05 NOTE — Telephone Encounter (Signed)
Ok thank you 

## 2019-04-05 NOTE — Telephone Encounter (Signed)
He should not take anything that is not on medication list. Please schedule appt with me to eval LE edema and cough

## 2019-04-05 NOTE — Telephone Encounter (Signed)
[name] is aware of message below.  Jack [name] schedule to come in to see [facility] Monday 04/09/2019.

## 2019-04-06 ENCOUNTER — Telehealth: Payer: Self-pay | Admitting: Nurse Practitioner

## 2019-04-06 NOTE — Telephone Encounter (Signed)

## 2019-04-09 ENCOUNTER — Other Ambulatory Visit: Payer: Self-pay

## 2019-04-09 ENCOUNTER — Encounter: Payer: Self-pay | Admitting: Nurse Practitioner

## 2019-04-09 ENCOUNTER — Ambulatory Visit (INDEPENDENT_AMBULATORY_CARE_PROVIDER_SITE_OTHER): Payer: Medicare Other | Admitting: Nurse Practitioner

## 2019-04-09 VITALS — BP 148/86 | HR 101 | Temp 98.7°F | Ht 62.0 in | Wt 147.2 lb

## 2019-04-09 DIAGNOSIS — Z23 Encounter for immunization: Secondary | ICD-10-CM

## 2019-04-09 DIAGNOSIS — R600 Localized edema: Secondary | ICD-10-CM

## 2019-04-09 DIAGNOSIS — I10 Essential (primary) hypertension: Secondary | ICD-10-CM | POA: Diagnosis not present

## 2019-04-09 DIAGNOSIS — L719 Rosacea, unspecified: Secondary | ICD-10-CM

## 2019-04-09 MED ORDER — METRONIDAZOLE 0.75 % EX GEL: 1.0000 "application " | Freq: Two times a day (BID) | CUTANEOUS | 0 refills | Status: DC

## 2019-04-09 MED ORDER — POTASSIUM CHLORIDE CRYS ER 20 MEQ PO TBCR: 20.0000 meq | EXTENDED_RELEASE_TABLET | Freq: Every day | ORAL | 0 refills | Status: DC

## 2019-04-09 MED ORDER — FUROSEMIDE 20 MG PO TABS: 20.0000 mg | ORAL_TABLET | Freq: Every day | ORAL | 0 refills | Status: DC

## 2019-04-09 NOTE — Progress Notes (Signed)
Subjective:  Patient ID: Jack [name], male    DOB: 19-Dec-1933  Age: 85 y.o. MRN: [id number]  CC: Leg Swelling (follow up on leg swelling and cough--)  Accompanied by wife: Jack [name]  Rash This is a new problem. The current episode started in the past 7 days. The problem is unchanged. The affected locations include the face. The rash is characterized by redness. He was exposed to nothing. Pertinent negatives include no eye pain or facial edema. Past treatments include nothing. There is no history of allergies or varicella.  denies  Change in soap or lotion.  HTN: BP improved with medication compliance: amlodipine and losartan. He declined to take HCTZ due to sulfa allergy. Home BP readings: 140s-150s/80s-90s Has chronic LE edema with no redness, no leg pain, no PND, no SOB. BP Readings from Last 3 Encounters:  04/09/19 (!) 148/86  03/23/19 (!) 158/86  02/23/19 (!) 176/80   Reviewed past Medical, Social and Family history today.  Outpatient Medications Prior to Visit  Medication Sig Dispense Refill  . albuterol (VENTOLIN HFA) 108 (90 Base) MCG/ACT inhaler Inhale 1 puff into the lungs every 6 (six) hours as needed for wheezing or shortness of breath. 6.7 g 1  . amLODipine (NORVASC) 5 MG tablet Take 1 tablet (5 mg total) by mouth 2 (two) times daily. 180 tablet 1  . aspirin EC 81 MG tablet Take 1 tablet (81 mg total) by mouth daily.    . Cholecalciferol (VITAMIN D3 PO) Take by mouth.    . donepezil (ARICEPT) 10 MG tablet Take 1 tablet (10 mg total) by mouth at bedtime. Start 1/2 tablet daily x 1 month and then 1 tablet daily 30 tablet 2  . olmesartan (BENICAR) 40 MG tablet Take 1 tablet (40 mg total) by mouth daily. 90 tablet 1  . tamsulosin (FLOMAX) 0.4 MG CAPS capsule Take 1 capsule (0.4 mg total) by mouth daily after supper. 90 capsule 1   No facility-administered medications prior to visit.     ROS See HPI  Objective:  BP (!) 148/86   Pulse (!) 101   Temp 98.7 F (37.1  C) (Tympanic)   Ht 5\' 2"  (1.575 m)   Wt 147 lb 3.2 oz (66.8 kg)   SpO2 99%   BMI 26.92 kg/m   BP Readings from Last 3 Encounters:  04/09/19 (!) 148/86  03/23/19 (!) 158/86  02/23/19 (!) 176/80    Wt Readings from Last 3 Encounters:  04/09/19 147 lb 3.2 oz (66.8 kg)  03/23/19 144 lb 14.4 oz (65.7 kg)  02/23/19 143 lb 3.2 oz (65 kg)    Physical Exam Cardiovascular:     Rate and Rhythm: Normal rate and regular rhythm.     Pulses: Normal pulses.     Heart sounds: Normal heart sounds.  Pulmonary:     Effort: Pulmonary effort is normal.     Breath sounds: Normal breath sounds.  Musculoskeletal:     Right lower leg: Edema present.     Left lower leg: Edema present.  Skin:    General: Skin is dry.     Findings: Erythema and rash present. Rash is pustular.       Neurological:     Mental Status: He is alert and oriented to person, place, and time.     Lab Results  Component Value Date   WBC 11.6 (H) 12/27/2018   HGB 12.9 (L) 12/27/2018   HCT 39.0 12/27/2018   PLT 267 12/27/2018   GLUCOSE  99 01/26/2019   CHOL 151 03/10/2017   TRIG 143.0 03/10/2017   HDL 30.40 (L) 03/10/2017   LDLCALC 92 03/10/2017   ALT 14 12/27/2018   AST 19 12/27/2018   NA 141 01/26/2019   K 4.0 01/26/2019   CL 104 01/26/2019   CREATININE 0.87 01/26/2019   BUN 16 01/26/2019   CO2 29 01/26/2019   TSH 0.977 02/21/2019   PSA 5.1 (H) 01/26/2019   INR 1.2 12/27/2018   HGBA1C 5.8 (H) 01/26/2019    Mr Brain W [id number] Contrast  Result Date: 01/29/2019  [facility] [address] [place], [place] [address] [phone] NEUROIMAGING REPORT STUDY DATE: 01/29/2019 PATIENT NAME: Jack [name] DOB: [birth year] MRN: [id number] EXAM: MRI Brain with and without contrast ORDERING CLINICIAN: Antony Contras, MD CLINICAL HISTORY: 85 year old man with dementia and confusion COMPARISON FILMS: MRI 07/04/2016; CT 12/27/2018; MRI 12/12/2007 TECHNIQUE:MRI of the brain with and without contrast was obtained  utilizing 5 mm axial slices with T1, T2, T2 flair, SWI and diffusion weighted views.  T1 sagittal, T2 coronal and postcontrast views in the axial and coronal plane were obtained. CONTRAST: 12 ml [employer] IMAGING SITE: [facility], [place]. FINDINGS: On sagittal images, the spinal cord is imaged caudally to C4-C5 and is normal in caliber.   The contents of the posterior fossa are of normal size and position.   The pituitary gland and optic chiasm appear normal.   There is moderate generalized cortical atrophy, a little more pronounced in the mesial temporal lobes and unchanged compared to the 12/27/2018 CT scan but mildly progressed compared to the 2017 MRI.  Corpus callosum atrophy is also noted.  There are no abnormal extra-axial collections of fluid.  There are very extensive confluent T2/flair hyperintense changes in the white matter of both hemispheres.  The largest confluencies are in the parieto-occipital region.  This is similar to the CT scan this year but does show some progression compared to the MRI from 2017 and severe progression compared to the MRI from 2009.  There are lacunar infarctions in the left cerebral hemisphere.   The cerebellar infarctions were present on the recent CT scan but one in the superior cerebellar arterial distribution was not present on the 2017 MRI.  Diffusion weighted images are normal.  Susceptibility weighted images show chronic heme products in the white matter adjacent to the right thalamus and basal ganglia.  There is also microhemorrhage in the right parietal lobe.  Of note, T2*gradient echo heme weighted images from 2009 did not show chronic heme products. The orbits appear normal.   The VIIth/VIIIth nerve complex appears normal.  The mastoid air cells appear normal.  The paranasal sinuses appear normal.  Flow voids are identified within the major intracerebral arteries.  After the infusion of contrast material, a normal enhancement pattern is  noted.   This MRI of the brain with and without contrast shows the following: 1.   Moderate generalized cortical atrophy, a little more pronounced in the mesial temporal lobes and the insular regions.  This has progressed compared to the 2017 MRI. 2.   Severe white matter T2 hyperintensity consistent with severe chronic microvascular ischemic changes, progressed compared to the 2017 MRI and markedly progressed compared to the 2009 MRI. 3.   Chronic heme products in the white matter adjacent to the thalamus and basal ganglia on the right and a chronic microhemorrhage in the right parietal lobe.  These changes were not apparent on the 2009  MRI.  The 2017 MRI did not include susceptibility weighted imaging.     Additionally, there are lacunar infarctions in the cerebellum, one that has occurred since the 2017 MRI. 4.   There is a normal enhancement pattern and no acute findings. INTERPRETING PHYSICIAN: Jack A. Felecia Shelling, MD, PhD, FAAN Certified in  Neuroimaging by [employer]    Assessment & Plan:   Jack [name] was seen today for leg swelling.  Diagnoses and all orders for this visit:  Essential hypertension  Bilateral leg edema -     furosemide (LASIX) 20 MG tablet; Take 1 tablet (20 mg total) by mouth daily. -     potassium chloride SA (K-DUR) 20 MEQ tablet; Take 1 tablet (20 mEq total) by mouth daily.  Need for influenza vaccination -     Flu Vaccine QUAD High Dose(Fluad)  Rosacea -     metroNIDAZOLE (METROGEL) 0.75 % gel; Apply 1 application topically 2 (two) times daily.   I am having Jack [name] start on furosemide, potassium chloride SA, and metroNIDAZOLE. I am also having him maintain his Cholecalciferol (VITAMIN D3 PO), aspirin EC, donepezil, olmesartan, tamsulosin, albuterol, and amLODipine.  Meds ordered this encounter  Medications  . furosemide (LASIX) 20 MG tablet    Sig: Take 1 tablet (20 mg total) by mouth daily.    Dispense:  3 tablet    Refill:  0     Order Specific Question:   Supervising Provider    Answer:   Lucille Passy [[id number]]  . potassium chloride SA (K-DUR) 20 MEQ tablet    Sig: Take 1 tablet (20 mEq total) by mouth daily.    Dispense:  3 tablet    Refill:  0    Order Specific Question:   Supervising Provider    Answer:   Lucille Passy [[id number]]  . metroNIDAZOLE (METROGEL) 0.75 % gel    Sig: Apply 1 application topically 2 (two) times daily.    Dispense:  45 g    Refill:  0    Order Specific Question:   Supervising Provider    Answer:   Lucille Passy [[id number]]    Problem List Items Addressed This Visit      Cardiovascular and Mediastinum   Essential hypertension - Primary   Relevant Medications   furosemide (LASIX) 20 MG tablet    Other Visit Diagnoses    Bilateral leg edema       Relevant Medications   furosemide (LASIX) 20 MG tablet   potassium chloride SA (K-DUR) 20 MEQ tablet   Need for influenza vaccination       Relevant Orders   Flu Vaccine QUAD High Dose(Fluad)   Rosacea       Relevant Medications   metroNIDAZOLE (METROGEL) 0.75 % gel      Follow-up: Return in about 3 months (around 07/09/2019) for HTN and DM.  Wilfred Lacy, NP

## 2019-04-09 NOTE — Assessment & Plan Note (Signed)
BP improved with medication compliance: amlodipine and losartan. He declined to take HCTZ due to sulfa allergy. Home BP readings: 140s-150s/80s-90s Has chronic LE edema with no redness, no leg pain, no PND, no SOB. BP Readings from Last 3 Encounters:  04/09/19 (!) 148/86  03/23/19 (!) 158/86  02/23/19 (!) 176/80

## 2019-04-09 NOTE — Patient Instructions (Addendum)
Take current medications as prescribed Add furosemide and potassium x3days then stop.  Wear compression stocking during the day and off at night. Elevated legs when sitting.  Reschedule upcoming appt in October to December.   use metronidazole gel on forehead BID  Rosacea Rosacea is a long-term (chronic) condition that affects the skin of the face, including the cheeks, nose, forehead, and chin. This condition can also affect the eyes. Rosacea causes blood vessels near the surface of the skin to get bigger (be enlarged), and that makes the skin red. What are the causes? The cause of this condition is not known. Certain things can make rosacea worse, including:  Hot baths.  Exercise.  Sunlight.  Very hot or cold temperatures.  Hot or spicy foods and drinks.  Drinking alcohol.  Stress.  Taking blood pressure medicine.  Long-term use of topical steroids on the face. What increases the risk? You are more likely to get this condition if you:  Are older than 83 years of age.  Are a woman.  Have light-colored skin (light complexion).  Have a family history of the condition. What are the signs or symptoms?   Redness of the face.  Red bumps or pimples on the face.  A red, enlarged nose.  Blushing easily.  Red lines on the skin.  Irritated, burning, or itchy feeling in the eyes.  Swollen eyelids.  Drainage from the eyes.  Feeling like there is something in your eye. How is this treated? There is no cure for this condition, but treatment can help to control your symptoms. Your doctor may suggest that you see a skin specialist (dermatologist). Treatment may include:  Medicines that are put on the skin or taken by mouth (orally).  Laser treatment to improve how the skin looks.  Surgery. This is rare. Your doctor will also suggest the best way to take care of your skin. Even after your skin gets better, you will likely need to continue treatment to keep your  rosacea from coming back. Follow these instructions at home: Skin care Take care of your skin as told by your doctor. Your doctor may tell you to do these things:  Wash your skin gently two or more times each day.  Use mild soap.  Use a sunscreen or sunblock with SPF 30 or greater.  Use gentle cosmetics that are meant for sensitive skin.  Shave with an electric shaver instead of a blade. Lifestyle  Try to keep track of what foods make this condition worse. Avoid those foods. These may include: ? Spicy foods. ? Seafood. ? Cheese. ? Hot liquids. ? Nuts. ? Chocolate. ? Iodized salt.  Do not drink alcohol.  Avoid very cold or hot temperatures.  Try to reduce your stress. If you need help to do this, talk with your doctor.  When you exercise, do these things to stay cool: ? Limit sun exposure to your face. ? Use a fan. ? Exercise for a shorter time, and exercise more often. General instructions  Take and apply over-the-counter and prescription medicines only as told by your doctor.  If you were prescribed an antibiotic medicine, apply it or take it as told by your doctor. Do not stop using the antibiotic even if your condition improves.  If your eyelids are affected, hold warm compresses on them. Do this as told by your doctor.  Keep all follow-up visits as told by your doctor. This is important. Contact a doctor if:  Your symptoms get worse.  Your symptoms do not improve after 2 months of treatment.  You have new symptoms.  You have any changes in how you see (vision) or you have problems with your eyes, such as redness or itching.  You feel very sad (depressed).  You do not want to eat as much as normal (lose your appetite).  You have trouble focusing your mind (concentrating). Summary  Rosacea is a long-term condition that affects the skin of the face, including the cheeks, nose, forehead, and chin.  Take care of your skin as told by your doctor.  Take  and apply medicines only as told by your doctor.  Contact a doctor if your symptoms get worse or if you have problems with your eyes. This information is not intended to replace advice given to you by your health care provider. Make sure you discuss any questions you have with your health care provider. Document Released: 09/20/2011 Document Revised: 11/30/2017 Document Reviewed: 11/30/2017 Elsevier Patient Education  2020 [employer].

## 2019-04-13 ENCOUNTER — Telehealth: Payer: Self-pay | Admitting: Nurse Practitioner

## 2019-04-13 NOTE — Telephone Encounter (Signed)
Mo, home health nurse call to inform [name] that Mr. Ezzell is refusing to take lasix due to frequent urinate.

## 2019-04-16 NOTE — Telephone Encounter (Signed)
Pt was informed to continue taking medication(Furosemide)and to use portable urinal to help with frequent trips to the bathroom or any accidents. Pt still refused to take medication.

## 2019-04-16 NOTE — Telephone Encounter (Signed)
I had advised him and his wife to use portable urinal for avoid frequent trip to bathroom or any accidents. Furosemide is only prescribed for 3days to help with LE edema.

## 2019-04-20 ENCOUNTER — Ambulatory Visit: Payer: Medicare Other | Admitting: Nurse Practitioner

## 2019-05-15 ENCOUNTER — Encounter: Payer: Self-pay | Admitting: Cardiology

## 2019-05-15 ENCOUNTER — Other Ambulatory Visit: Payer: Self-pay

## 2019-05-15 ENCOUNTER — Telehealth (INDEPENDENT_AMBULATORY_CARE_PROVIDER_SITE_OTHER): Payer: Medicare Other | Admitting: Cardiology

## 2019-05-15 VITALS — BP 133/79 | Wt 148.0 lb

## 2019-05-15 DIAGNOSIS — E782 Mixed hyperlipidemia: Secondary | ICD-10-CM

## 2019-05-15 DIAGNOSIS — I10 Essential (primary) hypertension: Secondary | ICD-10-CM

## 2019-05-15 DIAGNOSIS — F0151 Vascular dementia with behavioral disturbance: Secondary | ICD-10-CM

## 2019-05-15 DIAGNOSIS — F01518 Vascular dementia, unspecified severity, with other behavioral disturbance: Secondary | ICD-10-CM

## 2019-05-15 DIAGNOSIS — Z9889 Other specified postprocedural states: Secondary | ICD-10-CM

## 2019-05-15 DIAGNOSIS — R600 Localized edema: Secondary | ICD-10-CM

## 2019-05-15 MED ORDER — FUROSEMIDE 20 MG PO TABS: 20.0000 mg | ORAL_TABLET | ORAL | 1 refills | Status: DC | PRN

## 2019-05-15 MED ORDER — POTASSIUM CHLORIDE CRYS ER 10 MEQ PO TBCR: 10.0000 meq | EXTENDED_RELEASE_TABLET | ORAL | 1 refills | Status: DC | PRN

## 2019-05-15 NOTE — Progress Notes (Signed)
Virtual Visit via Telephone Note   This visit type was conducted due to national recommendations for restrictions regarding the COVID-19 Pandemic (e.g. social distancing) in an effort to limit this patient's exposure and mitigate transmission in our community.  Due to his co-morbid illnesses, this patient is at least at moderate risk for complications without adequate follow up.  This format is felt to be most appropriate for this patient at this time.  The patient did not have access to video technology/had technical difficulties with video requiring transitioning to audio format only (telephone).  All issues noted in this document were discussed and addressed.  No physical exam could be performed with this format.  Please refer to the patient's chart for his  consent to telehealth for [facility].  Evaluation Performed:  Follow-up visit  This visit type was conducted due to national recommendations for restrictions regarding the COVID-19 Pandemic (e.g. social distancing).  This format is felt to be most appropriate for this patient at this time.  All issues noted in this document were discussed and addressed.  No physical exam was performed (except for noted visual exam findings with Video Visits).  Please refer to the patient's chart (MyChart message for video visits and phone note for telephone visits) for the patient's consent to telehealth for [facility].  Date:  05/15/2019  ID: Jack [name], DOB 08-20-1933, MRN [id number]   Patient Location: [place] [address]   Provider location:   [place] [facility]  PCP:  Nche, Charlene Brooke, NP  Cardiologist:  Jenne Campus, MD     Chief Complaint: I am doing fine  History of Present Illness:    Jack [name] is a 85 y.o. male  who presents via audio/video conferencing for a telehealth visit today.  Complex past medical history which include CVA, coronary artery disease, status post coronary artery  bypass graft, status post mitral valve repair.  Also history of hypertension some dementia, dyslipidemia.  He does have a televisit with me today visit is aware of the difficult.  He talks very little he only answer yes and no eventually end up talking to his wife and get more information about his status from his wife he does not have any complaint nothing bothers him but complain of having some swelling of lower extremities sometimes apparently this is the problem that has been noted already by primary care physician he was given furosemide with potassium but never got it.  His wife also expressed her concern about that.  Otherwise no palpitations, no chest pain, no tightness, no pressure no burning in the chest.   The patient does not have symptoms concerning for COVID-19 infection (fever, chills, cough, or new SHORTNESS OF BREATH).    Prior CV studies:   The following studies were reviewed today:  Last echocardiogram reviewed but that echocardiogram was from 2017     Past Medical History:  Diagnosis Date  . Acute CVA (cerebrovascular accident) ([place]) 07/05/2016  . Atrial fibrillation ([place])    Dr. Tollie Eth  . BPH (benign prostatic hypertrophy) 2012   prior therapy with finasteride, but as of 03/2015 no c/o; Dr. Luanne Bras  . Cellulitis of face 11/08   [facility]  . CVA (cerebral infarction) 2009, 2015  . Echocardiogram abnormal 02/11/2010   LVEF 70%, severe mitral regurgitation, pulm HTN   . ED (erectile dysfunction)    Viagra prn, consult with Urology 2012  . Elevated PSA 2012  BPH; Dr. Luanne Bras, [facility]; no more PSAs needed as of 2012 consult  . Gait abnormality    uses cane, s/p CVA  . Hemiparesis affecting left side as late effect of cerebrovascular accident ([place])   . Hiatal hernia    per 2008 EUS  . Hyperlipidemia   . Hypertension   . Microscopic hematuria    chronic, eval 2012 with Urology, benign at that time  . Obstructive uropathy  2012   Dr. Luanne Bras  . Pancreatic mass 01/2006   inconsequential per CT 2007  . 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 Patent foramen ovale    hx/o, notation of fistula on echocardiogram from 2009  . Prophylactic antibiotic 2016   requires due to valve disease  . Schatzki's ring 2008   per endoscopic ultrasound; Dr. Janetta Hora. [name]  . Stroke ([place])   . Ventral hernia    surgery consult 01/2010 with Dr. Excell Seltzer, elective repair if desired , but he declined  . Wears hearing aid     Past Surgical History:  Procedure Laterality Date  . CARDIAC CATHETERIZATION  02/2010   LVEF 70%, severe mitral regurgitation, pulm HTN; prior in 01/2006  . INGUINAL HERNIA REPAIR     bilat  . MITRAL VALVE REPAIR  01/2006   mitral repair with Maze procedure, tricuspid annuloplasty, MAZE and LAA oversewn by Dr. Roxy Manns     Current Meds  Medication Sig  . albuterol (VENTOLIN HFA) 108 (90 Base) MCG/ACT inhaler Inhale 1 puff into the lungs every 6 (six) hours as needed for wheezing or shortness of breath.  Marland Kitchen amLODipine (NORVASC) 5 MG tablet Take 1 tablet (5 mg total) by mouth 2 (two) times daily.  Marland Kitchen aspirin EC 81 MG tablet Take 1 tablet (81 mg total) by mouth daily.  . Cholecalciferol (VITAMIN D3 PO) Take by mouth.  . donepezil (ARICEPT) 10 MG tablet Take 1 tablet (10 mg total) by mouth at bedtime. Start 1/2 tablet daily x 1 month and then 1 tablet daily  . furosemide (LASIX) 20 MG tablet Take 1 tablet (20 mg total) by mouth daily.  . metroNIDAZOLE (METROGEL) 0.75 % gel Apply 1 application topically 2 (two) times daily.  Marland Kitchen olmesartan (BENICAR) 40 MG tablet Take 1 tablet (40 mg total) by mouth daily.  . potassium chloride SA (K-DUR) 20 MEQ tablet Take 1 tablet (20 mEq total) by mouth daily.  . tamsulosin (FLOMAX) 0.4 MG CAPS capsule Take 1 capsule (0.4 mg total) by mouth daily after supper.      Family History: The patient's Family history is  unknown by patient.   ROS:   Please see the history of present illness.     All other systems reviewed and are negative.   Labs/Other Tests and Data Reviewed:     Recent Labs: 12/27/2018: ALT 14; Hemoglobin 12.9; Platelets 267 01/26/2019: BUN 16; Creat 0.87; Potassium 4.0; Sodium 141 02/21/2019: TSH 0.977  Recent Lipid Panel    Component Value Date/Time   CHOL 151 03/10/2017 1100   TRIG 143.0 03/10/2017 1100   HDL 30.40 (L) 03/10/2017 1100   CHOLHDL 5 03/10/2017 1100   VLDL 28.6 03/10/2017 1100   LDLCALC 92 03/10/2017 1100      Exam:    Vital Signs:  BP 133/79   Wt 148 lb (67.1 kg)   BMI 27.07 kg/m     Wt Readings from Last 3 Encounters:  05/15/19 148 lb (67.1 kg)  04/09/19  147 lb 3.2 oz (66.8 kg)  03/23/19 144 lb 14.4 oz (65.7 kg)     Well nourished, well developed in no acute distress. Alert awake and x3 but talks very little we talk only over the phone he had no technical ability to establish video link.  Not in any distress and overall says he is doing well  Diagnosis for this visit:   1. Essential hypertension   2. Vascular dementia with behavior disturbance ([place])   3. Mixed hyperlipidemia   4. Status post mitral valve repair      ASSESSMENT & PLAN:    1.  Essential hypertension blood pressure well controlled now when he takes medications on the regular basis. 2.  Vascular dementia obviously big problem.  Apparently he stopped his medication for a while now he started back seems to be doing better in terms of blood pressure.  He does not talk much and I got all information from his wife interestingly she said that her memory is worse than his. 3.  Dyslipidemia I do not see any cholesterol since 2018 ideally he he need to have it done.  I offered him that but he does not want to. 4.  Swelling of lower extremities I told him the best way to address this would be to perform echocardiogram to check on the valve and function of his left ventricle ejection  fraction.  But he does not want to do it.  I spoke to his wife after that and I told her if situation gets worse she need to let us know hopefully by that time will be able to convince him to have the test.  Overall suicide situation I think she will be able to help the gentleman he and does not want to do anything.  COVID-19 Education: The signs and symptoms of COVID-19 were discussed with the patient and how to seek care for testing (follow up with PCP or arrange E-visit).  The importance of social distancing was discussed today.  Patient Risk:   After full review of this patients clinical status, I feel that they are at least moderate risk at this time.  Time:   Today, I have spent 5 minutes with the patient with telehealth technology discussing pt health issues.  I spent 21 minutes reviewing her chart before the visit.  Visit was finished at 3:32 PM.    Medication Adjustments/Labs and Tests Ordered: Current medicines are reviewed at length with the patient today.  Concerns regarding medicines are outlined above.  No orders of the defined types were placed in this encounter.  Medication changes: No orders of the defined types were placed in this encounter.    Disposition: Follow-up 5 months  Signed, Park Liter, MD, [facility] 05/15/2019 3:29 PM    [place]

## 2019-05-15 NOTE — Patient Instructions (Addendum)
Medication Instructions:  Your physician has recommended you make the following change in your medication:   CHANGE TO TAKE ONLY AS NEEDED FOR LEG SWELLING: Lasix 20 mg daily and Potassium 10 meq daily   *If you need a refill on your cardiac medications before your next appointment, please call your pharmacy*  Lab Work: None.  If you have labs (blood work) drawn today and your tests are completely normal, you will receive your results only by: Marland Kitchen MyChart Message (if you have MyChart) OR . A paper copy in the mail If you have any lab test that is abnormal or we need to change your treatment, we will call you to review the results.  Testing/Procedures: None.   Follow-Up: At [facility], you and your health needs are our priority.  As part of our continuing mission to provide you with exceptional heart care, we have created designated Provider Care Teams.  These Care Teams include your primary Cardiologist (physician) and Advanced Practice Providers (APPs -  Physician Assistants and Nurse Practitioners) who all work together to provide you with the care you need, when you need it.  Your next appointment:   6 months  The format for your next appointment:   In Person  Provider:   You may see Dr. Agustin Cree or the following Advanced Practice Provider on your designated Care Team:    Laurann Montana, FNP   Other Instructions  Furosemide tablets What is this medicine? FUROSEMIDE (fyoor [place] se mide) is a diuretic. It helps you make more urine and to lose salt and excess water from your body. This medicine is used to treat high blood pressure, and edema or swelling from heart, kidney, or liver disease. This medicine may be used for other purposes; ask your health care provider or pharmacist if you have questions. COMMON BRAND NAME(S): Active-Medicated Specimen Kit, Delone, Diuscreen, Lasix, RX Specimen Collection Kit, Specimen Collection Kit, URINX Medicated Specimen Collection What  should I tell my health care provider before I take this medicine? They need to know if you have any of these conditions:  abnormal blood electrolytes  diarrhea or vomiting  gout  heart disease  kidney disease, small amounts of urine, or difficulty passing urine  liver disease  thyroid disease  an unusual or allergic reaction to furosemide, sulfa drugs, other medicines, foods, dyes, or preservatives  pregnant or trying to get pregnant  breast-feeding How should I use this medicine? Take this medicine by mouth with a glass of water. Follow the directions on the prescription label. You may take this medicine with or without food. If it upsets your stomach, take it with food or milk. Do not take your medicine more often than directed. Remember that you will need to pass more urine after taking this medicine. Do not take your medicine at a time of day that will cause you problems. Do not take at bedtime. Talk to your pediatrician regarding the use of this medicine in children. While this drug may be prescribed for selected conditions, precautions do apply. Overdosage: If you think you have taken too much of this medicine contact a poison control center or emergency room at once. NOTE: This medicine is only for you. Do not share this medicine with others. What if I miss a dose? If you miss a dose, take it as soon as you can. If it is almost time for your next dose, take only that dose. Do not take double or extra doses. What may interact with this  medicine?  aspirin and aspirin-like medicines  certain antibiotics  chloral hydrate  cisplatin  cyclosporine  digoxin  diuretics  laxatives  lithium  medicines for blood pressure  medicines that relax muscles for surgery  methotrexate  NSAIDs, medicines for pain and inflammation like ibuprofen, naproxen, or indomethacin  phenytoin  steroid medicines like prednisone or cortisone  sucralfate  thyroid hormones This  list may not describe all possible interactions. Give your health care provider a list of all the medicines, herbs, non-prescription drugs, or dietary supplements you use. Also tell them if you smoke, drink alcohol, or use illegal drugs. Some items may interact with your medicine. What should I watch for while using this medicine? Visit your doctor or health care provider for regular checks on your progress. Check your blood pressure regularly. Ask your doctor or health care provider what your blood pressure should be, and when you should contact him or her. If you are a diabetic, check your blood sugar as directed. This medicine may cause serious skin reactions. They can happen weeks to months after starting the medicine. Contact your health care provider right away if you notice fevers or flu-like symptoms with a rash. The rash may be red or purple and then turn into blisters or peeling of the skin. Or, you might notice a red rash with swelling of the face, lips or lymph nodes in your neck or under your arms. You may need to be on a special diet while taking this medicine. Check with your doctor. Also, ask how many glasses of fluid you need to drink a day. You must not get dehydrated. You may get drowsy or dizzy. Do not drive, use machinery, or do anything that needs mental alertness until you know how this drug affects you. Do not stand or sit up quickly, especially if you are an older patient. This reduces the risk of dizzy or fainting spells. Alcohol can make you more drowsy and dizzy. Avoid alcoholic drinks. This medicine can make you more sensitive to the sun. Keep out of the sun. If you cannot avoid being in the sun, wear protective clothing and use sunscreen. Do not use sun lamps or tanning beds/booths. What side effects may I notice from receiving this medicine? Side effects that you should report to your doctor or health care professional as soon as possible:  blood in urine or stools  dry  mouth  fever or chills  hearing loss or ringing in the ears  irregular heartbeat  muscle pain or weakness, cramps  rash, fever, and swollen lymph nodes  redness, blistering, peeling or loosening of the skin, including inside the mouth  skin rash  stomach upset, pain, or nausea  tingling or numbness in the hands or feet  unusually weak or tired  vomiting or diarrhea  yellowing of the eyes or skin Side effects that usually do not require medical attention (report to your doctor or health care professional if they continue or are bothersome):  headache  loss of appetite  unusual bleeding or bruising This list may not describe all possible side effects. Call your doctor for medical advice about side effects. You may report side effects to FDA at 1-800-FDA-1088. Where should I keep my medicine? Keep out of the reach of children. Store at room temperature between 15 and 30 degrees C (59 and 86 degrees F). Protect from light. Throw away any unused medicine after the expiration date. NOTE: This sheet is a summary. It may not cover all  possible information. If you have questions about this medicine, talk to your doctor, pharmacist, or health care provider.  2020 Elsevier/Gold Standard (2018-09-29 14:04:13)  Potassium chloride tablets, extended-release tablets or capsules What is this medicine? POTASSIUM CHLORIDE (poe TASS i um KLOOR ide) is a potassium supplement used to prevent and to treat low potassium. Potassium is important for the heart, muscles, and nerves. Too much or too little potassium in the body can cause serious problems. This medicine may be used for other purposes; ask your health care provider or pharmacist if you have questions. COMMON BRAND NAME(S): ED-K+10, K-10, K-8, K-Dur, K-Tab, Kaon-CL, Klor-Con, Klor-Con M10, Klor-Con M15, Klor-Con M20, Klotrix, Micro-K, Micro-K Extencaps, Slow-K What should I tell my health care provider before I take this medicine? They  need to know if you have any of these conditions:  Addison's disease  dehydration  diabetes  difficulty swallowing  heart disease  high levels of potassium in the blood  irregular heartbeat  kidney disease  recent severe burn  stomach ulcers or other stomach problems  an unusual or allergic reaction to potassium, tartrazine, other medicines, foods, dyes, or preservatives  pregnant or trying to get pregnant  breast-feeding How should I use this medicine? Take this medicine by mouth with a full glass of water. Take with food. Follow the directions on the prescription label. Do not suck on, crush, or chew this medicine. If you have difficulty swallowing, ask the pharmacist how to take. Take your medicine at regular intervals. Do not take it more often than directed. Do not stop taking except on your doctor's advice. Talk to your pediatrician regarding the use of this medicine in children. Special care may be needed. Overdosage: If you think you have taken too much of this medicine contact a poison control center or emergency room at once. NOTE: This medicine is only for you. Do not share this medicine with others. What if I miss a dose? If you miss a dose, take it as soon as you can. If it is almost time for your next dose, take only that dose. Do not take double or extra doses. What may interact with this medicine? Do not take this medicine with any of the following medications:  certain diuretics such as spironolactone, triamterene  certain medicines for stomach problems like atropine; difenoxin and glycopyrrolate  eplerenone  sodium polystyrene sulfonate This medicine may also interact with the following medications:  certain medicines for blood pressure or heart disease like lisinopril, losartan, quinapril, valsartan  medicines that lower your chance of fighting infection such as cyclosporine, tacrolimus  NSAIDs, medicines for pain and inflammation, like ibuprofen  or naproxen  other potassium supplements  salt substitutes This list may not describe all possible interactions. Give your health care provider a list of all the medicines, herbs, non-prescription drugs, or dietary supplements you use. Also tell them if you smoke, drink alcohol, or use illegal drugs. Some items may interact with your medicine. What should I watch for while using this medicine? Visit your doctor or health care professional for regular check ups. You will need lab work done regularly. You may need to be on a special diet while taking this medicine. Ask your doctor. What side effects may I notice from receiving this medicine? Side effects that you should report to your doctor or health care professional as soon as possible:  allergic reactions like skin rash, itching or hives, swelling of the face, lips, or tongue  black, tarry stools  breathing  problems  confusion  heartburn  fast, irregular heartbeat  feeling faint or lightheaded, falls  low blood pressure  numbness or tingling in hands or feet  pain when swallowing  unusually weak or tired  weakness, heaviness of legs Side effects that usually do not require medical attention (report to your doctor or health care professional if they continue or are bothersome):  diarrhea  nausea, vomiting  stomach pain This list may not describe all possible side effects. Call your doctor for medical advice about side effects. You may report side effects to FDA at 1-800-FDA-1088. Where should I keep my medicine? Keep out of the reach of children. Store at room temperature between 15 and 30 degrees C (59 and 86 degrees F ). Keep bottle closed tightly to protect this medicine from light and moisture. Throw away any unused medicine after the expiration date. NOTE: This sheet is a summary. It may not cover all possible information. If you have questions about this medicine, talk to your doctor, pharmacist, or health care  provider.  2020 Elsevier/Gold Standard (2016-03-31 11:43:27)

## 2019-05-15 NOTE — Addendum Note (Signed)
Addended by: Ashok Norris on: 05/15/2019 03:51 PM   Modules accepted: Orders

## 2019-05-19 ENCOUNTER — Other Ambulatory Visit: Payer: Self-pay | Admitting: Cardiology

## 2019-05-21 NOTE — Telephone Encounter (Signed)
Sildenafil refill sent to [facility] on [address], [place] per request.

## 2019-05-30 ENCOUNTER — Ambulatory Visit (INDEPENDENT_AMBULATORY_CARE_PROVIDER_SITE_OTHER): Payer: Medicare Other | Admitting: Adult Health

## 2019-05-30 ENCOUNTER — Encounter: Payer: Self-pay | Admitting: Adult Health

## 2019-05-30 ENCOUNTER — Other Ambulatory Visit: Payer: Self-pay

## 2019-05-30 VITALS — BP 141/85 | HR 93 | Temp 98.0°F | Ht 61.0 in | Wt 151.8 lb

## 2019-05-30 DIAGNOSIS — Z8673 Personal history of transient ischemic attack (TIA), and cerebral infarction without residual deficits: Secondary | ICD-10-CM

## 2019-05-30 DIAGNOSIS — F0150 Vascular dementia without behavioral disturbance: Secondary | ICD-10-CM | POA: Diagnosis not present

## 2019-05-30 MED ORDER — DONEPEZIL HCL 10 MG PO TABS: 10.0000 mg | ORAL_TABLET | Freq: Every day | ORAL | 2 refills | Status: DC

## 2019-05-30 NOTE — Progress Notes (Signed)
[facility]  PATIENT: Jack [name] DOB: Jan 06, 1934   REASON FOR VISIT: Follow-up for memory loss with history of stroke HISTORY FROM: Patient and wife   HISTORY OF PRESENT ILLNESS:  Update virtual video visit 12/28/2018 Dr. 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  The patient's wife states that she is noticed increasing confusion and cognitive impairment and memory loss for the last several months.  This has been gradually progressive.  She denies any recent episodes suggestive of stroke or TIA.  She has noticed recent episodes of confusion.  A few few days ago the patient was unable to find his way in his own home though is lived there for 20 years.  He fell yesterday in his home and hurt his shoulder.  He is unable to do most basics task and gets confused and is unable to complete task and his wife has to do more and more things for him.  Patient has not had any recent evaluation for reversible causes of dementia.  He did have lab work done yesterday in the ER and CBC, UA and CMP were unremarkable.  CT scan of the head showed moderate changes of chronic microvascular ischemia and old bilateral infarcts involving right posterior watershed and left posterior watershed as well as basal ganglia.  No acute abnormality was noted.  Patient has been a patient in the practice and in the past has been seen for intracerebral hemorrhage in January 2015 and has A. fib and hence has not been on long-term anticoagulation.  He had a stroke in December 2017.  He had previously been enrolled in the [facility] trial for blood pressure control for intracerebral hemorrhage in the past  Update 02/21/2019 Dr. Leonie Man; he returns for follow-up after last virtual video  visit on 12/28/2018.  Continues to have memory and cognitive difficulties with her progress gradually over the last several months.  He has occasional episodes of confusion.  He did undergo MRI scan of the brain which I ordered at last visit on 01/29/2019 which shows old lacunar infarcts as well as changes of small vessel disease which appear progressed compared to the previous MRI but there is no definite new acute infarct.  I had also ordered EEG and lab work for reversible causes of memory loss but these have not yet been done.  Patient remains on aspirin which is tolerating well without side effects.  He states blood pressures well controlled today it is 136/52.  Patient continues to have some residual left hand weakness and clumsiness.  He has no new complaints today.  Update 05/30/2019: Jack [name] is an 85 year old male who is being seen today for memory follow-up accompanied by his wife.  Aricept was initiated at prior visit and has continued on 10 mg nightly without side effects.  Memory has been stable without worsening.  MMSE 23/30 (prior 22/30).  Continues on aspirin 81 mg daily for secondary stroke prevention.  Blood pressure today 141/85.  Prior stroke residual deficit mild decreased left hand dexterity which has been stable.  Continues to use rolling walker for ambulation and denies any recent falls.  No concerns today.    REVIEW OF SYSTEMS: Full 14 system review of systems performed and notable only for those listed, all others are neg: Memory loss, confusion, weakness, decreased  hearing all other systems negative    ALLERGIES: Allergies  Allergen Reactions  . Sulfa Antibiotics Swelling    Swelling in groin area    HOME MEDICATIONS: Outpatient Medications Prior to Visit  Medication Sig Dispense Refill  . albuterol (VENTOLIN HFA) 108 (90 Base) MCG/ACT inhaler Inhale 1 puff into the lungs every 6 (six) hours as needed for wheezing or shortness of breath. 6.7 g 1  . amLODipine (NORVASC)  5 MG tablet Take 1 tablet (5 mg total) by mouth 2 (two) times daily. 180 tablet 1  . aspirin EC 81 MG tablet Take 1 tablet (81 mg total) by mouth daily.    . Cholecalciferol (VITAMIN D3 PO) Take by mouth.    . furosemide (LASIX) 20 MG tablet Take 1 tablet (20 mg total) by mouth as needed (As needed for leg swelling). 30 tablet 1  . metroNIDAZOLE (METROGEL) 0.75 % gel Apply 1 application topically 2 (two) times daily. 45 g 0  . olmesartan (BENICAR) 40 MG tablet Take 1 tablet (40 mg total) by mouth daily. 90 tablet 1  . potassium chloride SA (KLOR-CON) 10 MEQ tablet Take 1 tablet (10 mEq total) by mouth as needed (as needed for leg swelling when you take lasix). 30 tablet 1  . sildenafil (VIAGRA) 100 MG tablet TAKE 1/2 TO 1 TABLET BY MOUTH EVERY DAY AS NEEDED FOR ERECTILE DYSFUNCTION 22 tablet 0  . tamsulosin (FLOMAX) 0.4 MG CAPS capsule Take 1 capsule (0.4 mg total) by mouth daily after supper. 90 capsule 1  . donepezil (ARICEPT) 10 MG tablet Take 1 tablet (10 mg total) by mouth at bedtime. Start 1/2 tablet daily x 1 month and then 1 tablet daily 30 tablet 2   No facility-administered medications prior to visit.     PAST MEDICAL HISTORY: Past Medical History:  Diagnosis Date  . Acute CVA (cerebrovascular accident) ([place]) 07/05/2016  . Atrial fibrillation ([place])    Dr. Tollie Eth  . BPH (benign prostatic hypertrophy) 2012   prior therapy with finasteride, but as of 03/2015 no c/o; Dr. Luanne Bras  . Cellulitis of face 11/08   [facility]  . CVA (cerebral infarction) 2009, 2015  . Echocardiogram abnormal 02/11/2010   LVEF 70%, severe mitral regurgitation, pulm HTN   . ED (erectile dysfunction)    Viagra prn, consult with Urology 2012  . Elevated PSA 2012   BPH; Dr. Luanne Bras, [facility]; no more PSAs needed as of 2012 consult  . Gait abnormality    uses cane, s/p CVA  . Hemiparesis affecting left side as late effect of cerebrovascular accident ([place])   . Hiatal  hernia    per 2008 EUS  . Hyperlipidemia   . Hypertension   . Microscopic hematuria    chronic, eval 2012 with Urology, benign at that time  . Obstructive uropathy 2012   Dr. Luanne Bras  . Pancreatic mass 01/2006   inconsequential per CT 2007  . 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 Patent foramen ovale    hx/o, notation of fistula on echocardiogram from 2009  . Prophylactic antibiotic 2016   requires due to valve disease  . Schatzki's ring 2008   per endoscopic ultrasound; Dr. Janetta Hora. [name]  . Stroke ([place])   . Ventral hernia    surgery consult 01/2010 with Dr. Excell Seltzer, elective repair if desired , but he declined  . Wears hearing aid     PAST SURGICAL HISTORY: Past Surgical  History:  Procedure Laterality Date  . CARDIAC CATHETERIZATION  02/2010   LVEF 70%, severe mitral regurgitation, pulm HTN; prior in 01/2006  . INGUINAL HERNIA REPAIR     bilat  . MITRAL VALVE REPAIR  01/2006   mitral repair with Maze procedure, tricuspid annuloplasty, MAZE and LAA oversewn by Dr. Roxy Manns    FAMILY HISTORY: Family History  Family history unknown: Yes    SOCIAL HISTORY: Social History   Socioeconomic History  . Marital status: Married    Spouse name: [name]  . Number of children: Not on file  . Years of education: Not on file  . Highest education level: Not on file  Occupational History  . Not on file  Social Needs  . Financial resource strain: Not on file  . Food insecurity    Worry: Not on file    Inability: Not on file  . Transportation needs    Medical: Not on file    Non-medical: Not on file  Tobacco Use  . Smoking status: Never Smoker  . Smokeless tobacco: Never Used  Substance and Sexual Activity  . Alcohol use: No  . Drug use: No  . Sexual activity: Not on file  Lifestyle  . Physical activity    Days per week: Not on file    Minutes per session: Not on file  . Stress: Not on file   Relationships  . Social Herbalist on phone: Not on file    Gets together: Not on file    Attends religious service: Not on file    Active member of club or organization: Not on file    Attends meetings of clubs or organizations: Not on file    Relationship status: Not on file  . Intimate partner violence    Fear of current or ex partner: Not on file    Emotionally abused: Not on file    Physically abused: Not on file    Forced sexual activity: Not on file  Other Topics Concern  . Not on file  Social History Narrative   Married, exercises with stationary bike 5 days per week.   Eats healthy.  As of 03/2015     PHYSICAL EXAM  Vitals:   05/30/19 1537  BP: (!) 141/85  Pulse: 93  Temp: 98 F (36.7 C)  Weight: 151 lb 12.8 oz (68.9 kg)  Height: 5\' 1"  (1.549 m)   Body mass index is 28.68 kg/m.  Generalized: Well developed, pleasant elderly Caucasian male, in no acute distress  Head: normocephalic and atraumatic,. Oropharynx benign  Neck: Supple, no carotid bruits  Cardiac: Regular rate rhythm, no murmur  Musculoskeletal: No deformity   Neurological examination   Mentation: Alert oriented to time, place, history taking. Attention span and concentration appropriate. Follows all commands without difficulty.  Normal speech and language. MMSE - Mini Mental State Exam 05/30/2019 02/21/2019 12/24/2016  Orientation to time 4 4 5   Orientation to Place 4 3 5   Registration 3 3 3   Attention/ Calculation 1 3 5   Recall 3 1 0  Language- name 2 objects 2 2 2   Language- repeat 1 1 1   Language- follow 3 step command 3 3 3   Language- read & follow direction 1 1 1   Write a sentence 1 0 1  Copy design 0 1 1  Copy design-comments named  animals - -  Total score 23 22 27    Cranial Nerves: Pupils equal, briskly reactive to light. Extraocular movements full without nystagmus.  Visual fields full to confrontation.  HOH bilaterally. Facial sensation intact. Face, tongue, palate moves  normally and symmetrically.  Motor: Normal bulk and tone. Normal strength in all tested extremity muscles. Sensory.: intact to touch , pinprick , position and vibratory sensation.  Coordination: Rapid alternating movements slightly diminished left hand. Finger-to-nose and heel-to-shin performed accurately bilaterally.  Gait and Station: Arises from chair with mild difficulty. Stance is hunched. Gait demonstrates  short shuffled steps with assistance of rolling walker Reflexes: 1+ and symmetric. Toes downgoing.     DIAGNOSTIC DATA (LABS, IMAGING, TESTING) - I reviewed patient records, labs, notes, testing and imaging myself where available.  Lab Results  Component Value Date   WBC 11.6 (H) 12/27/2018   HGB 12.9 (L) 12/27/2018   HCT 39.0 12/27/2018   MCV 90.1 12/27/2018   PLT 267 12/27/2018      Component Value Date/Time   NA 141 01/26/2019 1434   K 4.0 01/26/2019 1434   CL 104 01/26/2019 1434   CO2 29 01/26/2019 1434   GLUCOSE 99 01/26/2019 1434   BUN 16 01/26/2019 1434   CREATININE 0.87 01/26/2019 1434   CALCIUM 9.0 01/26/2019 1434   PROT 7.0 12/27/2018 1035   ALBUMIN 3.7 12/27/2018 1035   AST 19 12/27/2018 1035   ALT 14 12/27/2018 1035   ALKPHOS 36 (L) 12/27/2018 1035   BILITOT 1.3 (H) 12/27/2018 1035   GFRNONAA >60 12/27/2018 1035   GFRAA >60 12/27/2018 1035   Lab Results  Component Value Date   CHOL 151 03/10/2017   HDL 30.40 (L) 03/10/2017   LDLCALC 92 03/10/2017   TRIG 143.0 03/10/2017   CHOLHDL 5 03/10/2017   Lab Results  Component Value Date   HGBA1C 5.8 (H) 01/26/2019   Lab Results  Component Value Date   VITAMINB12 315 02/21/2019      ASSESSMENT AND PLAN 45 year caucasian male with cardiac embolic left frontal MCA branch infarct in December 2017 secondary to atrial fibrillation not on anticoagulation due to history of right thalamic intracerebral hemorrhage in January 2015 with mild left hemiparesis.  Vascular risk factors include HTN, HLD, and  atrial fibrillation.  Recent subacute worsening memory and cognition possibly due to mixed vascular and Alzheimer's dementia.  Initiated Aricept in August and memory has been stable   PLAN:  -Continue Aricept 10 mg nightly -refill provided -Continue aspirin 81 mg daily for secondary stroke prevention -Continue to do memory exercises such as crossword puzzles, word search, card games and reading -Continue to use rolling walker and maintain fall precautions at all times -Maintain strict control of hypertension with blood pressure goal below 130/90, lipids with LDL cholesterol goal below 70 mg percent.   Follow-up in 6 months or call earlier if needed    I spent 25 minutes in total face to face time with the patient more than 50% of which was spent counseling and coordination of care, reviewing test results reviewing medications along with discussion regarding recent initiation of Aricept and potential future treatment options and answered all questions to patient and wife satisfaction   Frann Rider, AGNP-BC  [facility] [address] [place], [place] [address]  Phone [phone] Fax [phone] Note: This document was prepared with digital dictation and possible smart phrase technology. Any transcriptional errors that result from this process are unintentional.

## 2019-05-30 NOTE — Patient Instructions (Signed)
Your Plan:  Continue Aricept 10 mg nightly  Continue to do memory exercises such as crossword puzzles, word search, card games and reading  Continue on aspirin for secondary stroke prevention  Follow-up in 6 months or call earlier if needed     Thank you for coming to see [place] at [facility]. I hope we have been able to provide you high quality care today.  You may receive a patient satisfaction survey over the next few weeks. We would appreciate your feedback and comments so that we may continue to improve ourselves and the health of our patients.

## 2019-06-01 ENCOUNTER — Ambulatory Visit: Payer: Medicare Other | Admitting: Nurse Practitioner

## 2019-06-01 NOTE — Progress Notes (Signed)
I agree with the above plan 

## 2019-06-08 ENCOUNTER — Other Ambulatory Visit: Payer: Self-pay | Admitting: Neurology

## 2019-06-29 ENCOUNTER — Other Ambulatory Visit: Payer: Self-pay | Admitting: Nurse Practitioner

## 2019-06-29 DIAGNOSIS — I10 Essential (primary) hypertension: Secondary | ICD-10-CM

## 2019-06-29 MED ORDER — AMLODIPINE BESYLATE 5 MG PO TABS: 5.0000 mg | ORAL_TABLET | Freq: Two times a day (BID) | ORAL | 0 refills | Status: DC

## 2019-06-29 NOTE — Telephone Encounter (Signed)
Copied from [place] #[id number]. Topic: Quick Communication - Rx Refill/Question >> Jun 29, 2019 12:53 PM Mcneil, Ja-Kwan wrote: Medication: amLODipine (NORVASC) 5 MG tablet  Has the patient contacted their pharmacy? yes   Preferred Pharmacy (with phone number or street name): [facility] [place], [address] Phone: [phone]  Fax: [phone]  Agent: Please be advised that RX refills may take up to 3 business days. We ask that you follow-up with your pharmacy.

## 2019-07-09 ENCOUNTER — Other Ambulatory Visit: Payer: Self-pay

## 2019-07-09 ENCOUNTER — Encounter: Payer: Self-pay | Admitting: Nurse Practitioner

## 2019-07-09 ENCOUNTER — Ambulatory Visit (INDEPENDENT_AMBULATORY_CARE_PROVIDER_SITE_OTHER): Payer: Medicare Other | Admitting: Nurse Practitioner

## 2019-07-09 VITALS — BP 140/87 | Ht 61.0 in | Wt 139.0 lb

## 2019-07-09 DIAGNOSIS — R739 Hyperglycemia, unspecified: Secondary | ICD-10-CM | POA: Diagnosis not present

## 2019-07-09 DIAGNOSIS — R350 Frequency of micturition: Secondary | ICD-10-CM

## 2019-07-09 DIAGNOSIS — I10 Essential (primary) hypertension: Secondary | ICD-10-CM

## 2019-07-09 DIAGNOSIS — R972 Elevated prostate specific antigen [PSA]: Secondary | ICD-10-CM

## 2019-07-09 DIAGNOSIS — N401 Enlarged prostate with lower urinary tract symptoms: Secondary | ICD-10-CM | POA: Diagnosis not present

## 2019-07-09 DIAGNOSIS — R600 Localized edema: Secondary | ICD-10-CM

## 2019-07-09 DIAGNOSIS — E782 Mixed hyperlipidemia: Secondary | ICD-10-CM | POA: Diagnosis not present

## 2019-07-09 DIAGNOSIS — Z9889 Other specified postprocedural states: Secondary | ICD-10-CM

## 2019-07-09 MED ORDER — OLMESARTAN MEDOXOMIL 40 MG PO TABS: 40.0000 mg | ORAL_TABLET | Freq: Every day | ORAL | 1 refills | Status: DC

## 2019-07-09 MED ORDER — DOXAZOSIN MESYLATE 1 MG PO TABS: 1.0000 mg | ORAL_TABLET | Freq: Every day | ORAL | 5 refills | Status: DC

## 2019-07-09 NOTE — Patient Instructions (Signed)
Stop flomax Start cardura for BPH and urinary frequency.  Go to lab for blood draw as scheduled.

## 2019-07-09 NOTE — Assessment & Plan Note (Signed)
No improvement with flomax. He did not maintain appt with [facility].  D/c flomax. Start cardura Repeat PSA yearly

## 2019-07-09 NOTE — Progress Notes (Signed)
Virtual Visit via Video Note  I connected with Jack [name] on 07/09/19 at 10:15 AM EST by a video enabled telemedicine application and verified that I am speaking with the correct person using two identifiers.  Location: Patient: home Provider: office Participants: patient, wife and provider  I discussed the limitations of evaluation and management by telemedicine and the availability of in person appointments. The patient expressed understanding and agreed to proceed.  CC: HTN and BPH f/up Some information provided by his wife.  History of Present Illness: HTN: Stable BP with amlodipine and benicar. His wife reports intermittent LE edema, but Jack [name] declines to use furosemide due to urinary frequency. He denies any SOB or cough or PND. During last OV with cardiology, he declined echocardiogram because he thought it was completed this year. I informed him that only CXR and MRI brain were completed this year. I explained that they do not evaluate hear function. He verbalized understanding and agreed to go for echocardiogram.  Wt Readings from Last 3 Encounters:  07/09/19 139 lb (63 kg)  05/30/19 151 lb 12.8 oz (68.9 kg)  05/15/19 148 lb (67.1 kg)   BP Readings from Last 3 Encounters:  07/09/19 140/87  05/30/19 (!) 141/85  05/15/19 133/79    BPH with urinary frequency: Worsening even with use of flomax. He denies any fever or ABD pain or dysuria or discharge or back pain or blood in urine or constipation He reports urinating every 24mins to 1hour.  Observations/Objective: Physical Exam  Pulmonary/Chest: Effort normal.  Neurological: He is alert.  Psychiatric: Thought content normal. His affect is blunt. His speech is delayed. He is slowed. He exhibits abnormal recent memory.  Vitals reviewed.  Assessment and Plan: Jack [name] was seen today for follow-up.  Diagnoses and all orders for this visit:  Essential hypertension -     HTN_1 BMP; Future -     ECHOCARDIOGRAM  COMPLETE; Future -     olmesartan (BENICAR) 40 MG tablet; Take 1 tablet (40 mg total) by mouth daily.  Benign prostatic hyperplasia with urinary frequency -     doxazosin (CARDURA) 1 MG tablet; Take 1 tablet (1 mg total) by mouth daily.  Mixed hyperlipidemia -     HTN_4 Lipid panel; Future  Hyperglycemia -     Hemoglobin A1c; Future  Status post mitral valve repair -     ECHOCARDIOGRAM COMPLETE; Future  Elevated PSA, less than 10 ng/ml  Bilateral leg edema -     ECHOCARDIOGRAM COMPLETE; Future   Follow Up Instructions: See avs   I discussed the assessment and treatment plan with the patient. The patient was provided an opportunity to ask questions and all were answered. The patient agreed with the plan and demonstrated an understanding of the instructions.   The patient was advised to call back or seek an in-person evaluation if the symptoms worsen or if the condition fails to improve as anticipated.  Daphane Shepherd, NP

## 2019-07-17 ENCOUNTER — Ambulatory Visit (HOSPITAL_COMMUNITY): Payer: Medicare Other | Attending: Cardiology

## 2019-07-17 ENCOUNTER — Other Ambulatory Visit: Payer: Self-pay

## 2019-07-17 DIAGNOSIS — R600 Localized edema: Secondary | ICD-10-CM | POA: Diagnosis present

## 2019-07-17 DIAGNOSIS — I10 Essential (primary) hypertension: Secondary | ICD-10-CM

## 2019-07-17 DIAGNOSIS — Z9889 Other specified postprocedural states: Secondary | ICD-10-CM | POA: Diagnosis present

## 2019-07-18 ENCOUNTER — Other Ambulatory Visit (INDEPENDENT_AMBULATORY_CARE_PROVIDER_SITE_OTHER): Payer: Medicare Other

## 2019-07-18 DIAGNOSIS — I10 Essential (primary) hypertension: Secondary | ICD-10-CM

## 2019-07-18 DIAGNOSIS — R739 Hyperglycemia, unspecified: Secondary | ICD-10-CM | POA: Diagnosis not present

## 2019-07-18 DIAGNOSIS — E782 Mixed hyperlipidemia: Secondary | ICD-10-CM | POA: Diagnosis not present

## 2019-07-18 LAB — BASIC METABOLIC PANEL
BUN: 13 mg/dL (ref 6–23)
CO2: 30 mEq/L (ref 19–32)
Calcium: 9.3 mg/dL (ref 8.4–10.5)
Chloride: 102 mEq/L (ref 96–112)
Creatinine, Ser: 0.91 mg/dL (ref 0.40–1.50)
GFR: 79.47 mL/min (ref >60.00)
Glucose, Bld: 137 mg/dL — ABNORMAL HIGH (ref 70–99)
Potassium: 4 mEq/L (ref 3.5–5.1)
Sodium: 141 mEq/L (ref 135–145)

## 2019-07-18 LAB — LIPID PANEL
Cholesterol: 145 mg/dL (ref 0–200)
HDL: 36 mg/dL — ABNORMAL LOW (ref >39.00)
LDL Cholesterol: 77 mg/dL (ref 0–99)
NonHDL: 109.2
Total CHOL/HDL Ratio: 4
Triglycerides: 159 mg/dL — ABNORMAL HIGH (ref 0.0–149.0)
VLDL: 31.8 mg/dL (ref 0.0–40.0)

## 2019-07-18 LAB — HEMOGLOBIN A1C: Hgb A1c MFr Bld: 5.5 % (ref 4.6–6.5)

## 2019-09-20 ENCOUNTER — Other Ambulatory Visit: Payer: Self-pay | Admitting: Nurse Practitioner

## 2019-09-20 DIAGNOSIS — I10 Essential (primary) hypertension: Secondary | ICD-10-CM

## 2019-09-20 MED ORDER — AMLODIPINE BESYLATE 5 MG PO TABS: 5.0000 mg | ORAL_TABLET | Freq: Two times a day (BID) | ORAL | 1 refills | Status: DC

## 2019-11-28 ENCOUNTER — Encounter: Payer: Self-pay | Admitting: Adult Health

## 2019-11-28 ENCOUNTER — Other Ambulatory Visit: Payer: Self-pay

## 2019-11-28 ENCOUNTER — Ambulatory Visit (INDEPENDENT_AMBULATORY_CARE_PROVIDER_SITE_OTHER): Payer: Medicare Other | Admitting: Adult Health

## 2019-11-28 VITALS — BP 159/91 | HR 111 | Ht 61.0 in | Wt 144.0 lb

## 2019-11-28 DIAGNOSIS — Z8673 Personal history of transient ischemic attack (TIA), and cerebral infarction without residual deficits: Secondary | ICD-10-CM | POA: Diagnosis not present

## 2019-11-28 DIAGNOSIS — F0150 Vascular dementia without behavioral disturbance: Secondary | ICD-10-CM | POA: Diagnosis not present

## 2019-11-28 MED ORDER — DONEPEZIL HCL 10 MG PO TABS: 10.0000 mg | ORAL_TABLET | Freq: Every day | ORAL | 3 refills | Status: DC

## 2019-11-28 NOTE — Patient Instructions (Addendum)
Your Plan:  Continue Aricept 10mg  nightly for vascular cognitive impairment  Recommend increasing your activity and exercise as well as doing memory exercises  Please ensure you monitor blood pressure at home and if it remains elevated, please call your primary doctor for further evaluation or proceed to ED for management    Follow up in 6 months or call earlier if needed unless your primary doctor would be okay with taking over the management of Aricept then you can follow up as needed - please speak with your primary doctor in regards to this     Thank you for coming to see [place] at [facility]. I hope we have been able to provide you high quality care today.  You may receive a patient satisfaction survey over the next few weeks. We would appreciate your feedback and comments so that we may continue to improve ourselves and the health of our patients.

## 2019-11-28 NOTE — Progress Notes (Signed)
[facility]  PATIENT: Jack [name] DOB: 05/02/1934   REASON FOR VISIT: Follow-up for memory loss with history of stroke HISTORY FROM: Patient and wife   HISTORY OF PRESENT ILLNESS:   Jack [name] is a 86 year old male with past medical history of atrial fibrillation not on long-term AC candidate, BPH, stroke, HLD, HTN, ventral hernia and right thalamic ICH on anticoagulation 2015 with mild residual left hemiparesis.  Evaluated by Dr. Leonie Man in 12/2018 due to gradual worsening of cognition and memory loss likely related to vascular dementia.  Initiated on Aricept 10 mg nightly tolerating well.  Return for follow-up in 05/2019 stable memory without worsening with MMSE 23/30.   Today, 11/28/2019, Jack [name] returns the office for follow-up regarding likely vascular dementia accompanied by his wife.  Reports cognition has been stable since prior visit 6 months ago. MMSE 15/30 (prior 23/30).  Continues on Aricept 10 mg nightly tolerating well.  No behavioral concerns.  Does admit to sedentary lifestyle with limited activity or exercise.  Blood pressure today elevated ranging from 160-170/90, asymptomatic.  He does monitor at home but unable to recall recent levels.  Continues to follow with PCP routinely for HLD and HTN management as well as routine follow-up with cardiology.  No concerns at this time.    REVIEW OF SYSTEMS: Full 14 system review of systems performed and notable only for those listed, all others are neg: Memory loss, confusion, weakness, decreased hearing all other systems negative    ALLERGIES: Allergies  Allergen Reactions  . Sulfa Antibiotics Swelling    Swelling in groin area    HOME MEDICATIONS: Outpatient Medications Prior to Visit  Medication Sig Dispense Refill  . albuterol (VENTOLIN HFA) 108 (90 Base) MCG/ACT inhaler Inhale 1 puff into the lungs every 6 (six) hours as needed for wheezing or shortness of breath. 6.7 g 1  . amLODipine (NORVASC) 5  MG tablet Take 1 tablet (5 mg total) by mouth 2 (two) times daily. 180 tablet 1  . aspirin EC 81 MG tablet Take 1 tablet (81 mg total) by mouth daily.    . Cholecalciferol (VITAMIN D3 PO) Take by mouth.    . donepezil (ARICEPT) 10 MG tablet Take 1 tablet (10 mg total) by mouth at bedtime. 90 tablet 2  . tamsulosin (FLOMAX) 0.4 MG CAPS capsule Take 0.4 mg by mouth.    . doxazosin (CARDURA) 1 MG tablet Take 1 tablet (1 mg total) by mouth daily. 30 tablet 5  . furosemide (LASIX) 20 MG tablet Take 1 tablet (20 mg total) by mouth as needed (As needed for leg swelling). (Patient not taking: Reported on 07/09/2019) 30 tablet 1  . olmesartan (BENICAR) 40 MG tablet Take 1 tablet (40 mg total) by mouth daily. 90 tablet 1  . potassium chloride SA (KLOR-CON) 10 MEQ tablet Take 1 tablet (10 mEq total) by mouth as needed (as needed for leg swelling when you take lasix). (Patient not taking: Reported on 07/09/2019) 30 tablet 1  . sildenafil (VIAGRA) 100 MG tablet TAKE 1/2 TO 1 TABLET BY MOUTH EVERY DAY AS NEEDED FOR ERECTILE DYSFUNCTION 22 tablet 0   No facility-administered medications prior to visit.    PAST MEDICAL HISTORY: Past Medical History:  Diagnosis Date  . Acute CVA (cerebrovascular accident) ([place]) 07/05/2016  . Atrial fibrillation ([place])    Dr. Tollie Eth  . BPH (benign prostatic hypertrophy) 2012   prior therapy with finasteride, but as of 03/2015 no c/o; Dr. Luanne Bras  .  Cellulitis of face 11/08   [facility]  . CVA (cerebral infarction) 2009, 2015  . Echocardiogram abnormal 02/11/2010   LVEF 70%, severe mitral regurgitation, pulm HTN   . ED (erectile dysfunction)    Viagra prn, consult with Urology 2012  . Elevated PSA 2012   BPH; Dr. Luanne Bras, [facility]; no more PSAs needed as of 2012 consult  . Gait abnormality    uses cane, s/p CVA  . Hemiparesis affecting left side as late effect of cerebrovascular accident ([place])   . Hiatal hernia    per 2008 EUS  .  Hyperlipidemia   . Hypertension   . Microscopic hematuria    chronic, eval 2012 with Urology, benign at that time  . Obstructive uropathy 2012   Dr. Luanne Bras  . Pancreatic mass 01/2006   inconsequential per CT 2007  . 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 Patent foramen ovale    hx/o, notation of fistula on echocardiogram from 2009  . Prophylactic antibiotic 2016   requires due to valve disease  . Schatzki's ring 2008   per endoscopic ultrasound; Dr. Janetta Hora. [name]  . Stroke ([place])   . Ventral hernia    surgery consult 01/2010 with Dr. Excell Seltzer, elective repair if desired , but he declined  . Wears hearing aid     PAST SURGICAL HISTORY: Past Surgical History:  Procedure Laterality Date  . CARDIAC CATHETERIZATION  02/2010   LVEF 70%, severe mitral regurgitation, pulm HTN; prior in 01/2006  . INGUINAL HERNIA REPAIR     bilat  . MITRAL VALVE REPAIR  01/2006   mitral repair with Maze procedure, tricuspid annuloplasty, MAZE and LAA oversewn by Dr. Roxy Manns    FAMILY HISTORY: Family History  Family history unknown: Yes    SOCIAL HISTORY: Social History   Socioeconomic History  . Marital status: Married    Spouse name: Jack [name]  . Number of children: Not on file  . Years of education: Not on file  . Highest education level: Not on file  Occupational History  . Not on file  Tobacco Use  . Smoking status: Never Smoker  . Smokeless tobacco: Never Used  Substance and Sexual Activity  . Alcohol use: No  . Drug use: No  . Sexual activity: Not on file  Other Topics Concern  . Not on file  Social History Narrative   Married, exercises with stationary bike 5 days per week.   Eats healthy.  As of 03/2015   Social Determinants of Health   Financial Resource Strain:   . Difficulty of Paying Living Expenses:   Food Insecurity:   . Worried About Charity fundraiser in the Last Year:   . Arboriculturist in the Last  Year:   Transportation Needs:   . Film/video editor (Medical):   Marland Kitchen Lack of Transportation (Non-Medical):   Physical Activity:   . Days of Exercise per Week:   . Minutes of Exercise per Session:   Stress:   . Feeling of Stress :   Social Connections:   . Frequency of Communication with Friends and Family:   . Frequency of Social Gatherings with Friends and Family:   . Attends Religious Services:   . Active Member of Clubs or Organizations:   . Attends Archivist Meetings:   Marland Kitchen Marital Status:   Intimate Partner Violence:   . Fear of Current or Ex-Partner:   . Emotionally Abused:   .  Physically Abused:   . Sexually Abused:      PHYSICAL EXAM  Vitals:   11/28/19 1546  Weight: 144 lb (65.3 kg)  Height: 5\' 1"  (1.549 m)   Body mass index is 27.21 kg/m.  Generalized: Well developed, pleasant elderly Caucasian male, in no acute distress  Head: normocephalic and atraumatic,. Oropharynx benign  Neck: Supple, no carotid bruits  Cardiac: Regular rate rhythm, no murmur  Musculoskeletal: No deformity   Neurological examination   Mentation: Alert oriented to time, place, history taking. Attention span and concentration appropriate. Follows all commands without difficulty.  Normal speech and language although delayed responses. MMSE - Mini Mental State Exam 11/28/2019 05/30/2019 02/21/2019  Orientation to time 1 4 4   Orientation to Place 3 4 3   Registration 1 3 3   Attention/ Calculation 0 1 3  Attention/Calculation-comments refused - -  Recall 3 3 1   Language- name 2 objects 2 2 2   Language- repeat 1 1 1   Language- follow 3 step command 3 3 3   Language- read & follow direction 1 1 1   Write a sentence 0 1 0  Copy design 0 0 1  Copy design-comments - named  animals -  Total score 15 23 22    Cranial Nerves: Pupils equal, briskly reactive to light. Extraocular movements full without nystagmus. Visual fields full to confrontation.  HOH bilaterally. Facial sensation  intact. Face, tongue, palate moves normally and symmetrically.  Motor: Normal bulk and tone. Normal strength in all tested extremity muscles. Sensory.: intact to touch , pinprick , position and vibratory sensation.  Coordination: Rapid alternating movements slightly diminished left hand. Finger-to-nose and heel-to-shin performed accurately bilaterally.  Gait and Station: Arises from chair with mild difficulty. Stance is hunched. Gait demonstrates  short shuffled steps with assistance of rolling walker Reflexes: 1+ and symmetric. Toes downgoing.       ASSESSMENT AND PLAN 62 year caucasian male with cardiac embolic left frontal MCA branch infarct in December 2017 secondary to atrial fibrillation not on anticoagulation due to prior history of right thalamic ICH in 07/2013 while on anticoagulation.  Residual stroke deficit of mild left hemiparesis and cognitive impairment.  Vascular risk factors include HTN, HLD, and atrial fibrillation.  Gradual worsening memory and cognition possibly due to mixed vascular and Alzheimer's dementia.  Initiated Aricept for stabilization of memory   PLAN:  -Continue Aricept 10 mg nightly -refill provided -Continue aspirin 81 mg daily for secondary stroke prevention -Continue to do memory exercises such as crossword puzzles, word search, card games and reading -Continue to use rolling walker and maintain fall precautions at all times -Maintain strict control of hypertension with blood pressure goal below 130/90, lipids with LDL cholesterol goal below 70 mg percent.   Follow-up in 6 months or call earlier if needed    I spent 25 minutes of face-to-face and non-face-to-face time with patient and wife.  This included previsit chart review, lab review, study review, order entry, electronic health record documentation, patient education   Frann Rider, [facility]  [facility] [address] [place], [place] [address]  Phone  [phone] Fax [phone] Note: This document was prepared with digital dictation and possible smart phrase technology. Any transcriptional errors that result from this process are unintentional.

## 2019-11-30 NOTE — Progress Notes (Signed)
I agree with the above plan 

## 2020-01-17 ENCOUNTER — Other Ambulatory Visit: Payer: Self-pay | Admitting: Nurse Practitioner

## 2020-01-17 DIAGNOSIS — I10 Essential (primary) hypertension: Secondary | ICD-10-CM

## 2020-01-20 ENCOUNTER — Other Ambulatory Visit: Payer: Self-pay | Admitting: Nurse Practitioner

## 2020-01-20 DIAGNOSIS — I10 Essential (primary) hypertension: Secondary | ICD-10-CM

## 2020-01-25 ENCOUNTER — Other Ambulatory Visit: Payer: Self-pay | Admitting: Nurse Practitioner

## 2020-01-25 DIAGNOSIS — J209 Acute bronchitis, unspecified: Secondary | ICD-10-CM

## 2020-02-06 ENCOUNTER — Other Ambulatory Visit: Payer: Self-pay | Admitting: Nurse Practitioner

## 2020-02-06 DIAGNOSIS — J42 Unspecified chronic bronchitis: Secondary | ICD-10-CM

## 2020-02-27 ENCOUNTER — Other Ambulatory Visit: Payer: Self-pay | Admitting: Nurse Practitioner

## 2020-02-27 DIAGNOSIS — I10 Essential (primary) hypertension: Secondary | ICD-10-CM

## 2020-03-07 ENCOUNTER — Other Ambulatory Visit: Payer: Self-pay | Admitting: Nurse Practitioner

## 2020-03-07 DIAGNOSIS — I10 Essential (primary) hypertension: Secondary | ICD-10-CM

## 2020-03-07 NOTE — Telephone Encounter (Signed)
Medication refill request received for Olmesartan. Refill request denied. Too soon to refill.    07/09/19 last OV  02/27/20 Last refill date // #90 // refills - 0

## 2020-04-02 ENCOUNTER — Inpatient Hospital Stay (HOSPITAL_COMMUNITY)
Admission: EM | Admit: 2020-04-02 | Discharge: 2020-04-08 | DRG: 064 | Disposition: A | Discharge status: Rehabilitation Facility | Payer: Medicare Other | Attending: Neurology | Admitting: Neurology

## 2020-04-02 ENCOUNTER — Inpatient Hospital Stay (HOSPITAL_COMMUNITY): Payer: Medicare Other

## 2020-04-02 ENCOUNTER — Emergency Department (HOSPITAL_COMMUNITY): Payer: Medicare Other

## 2020-04-02 ENCOUNTER — Encounter (HOSPITAL_COMMUNITY): Payer: Self-pay | Admitting: Emergency Medicine

## 2020-04-02 ENCOUNTER — Other Ambulatory Visit: Payer: Self-pay

## 2020-04-02 DIAGNOSIS — I615 Nontraumatic intracerebral hemorrhage, intraventricular: Principal | ICD-10-CM | POA: Diagnosis present

## 2020-04-02 DIAGNOSIS — G309 Alzheimer's disease, unspecified: Secondary | ICD-10-CM | POA: Diagnosis present

## 2020-04-02 DIAGNOSIS — F01518 Vascular dementia, unspecified severity, with other behavioral disturbance: Secondary | ICD-10-CM | POA: Diagnosis present

## 2020-04-02 DIAGNOSIS — G8110 Spastic hemiplegia affecting unspecified side: Secondary | ICD-10-CM | POA: Diagnosis present

## 2020-04-02 DIAGNOSIS — N401 Enlarged prostate with lower urinary tract symptoms: Secondary | ICD-10-CM | POA: Diagnosis present

## 2020-04-02 DIAGNOSIS — I10 Essential (primary) hypertension: Secondary | ICD-10-CM | POA: Diagnosis present

## 2020-04-02 DIAGNOSIS — J810 Acute pulmonary edema: Secondary | ICD-10-CM | POA: Diagnosis not present

## 2020-04-02 DIAGNOSIS — F0281 Dementia in other diseases classified elsewhere with behavioral disturbance: Secondary | ICD-10-CM | POA: Diagnosis present

## 2020-04-02 DIAGNOSIS — R4182 Altered mental status, unspecified: Secondary | ICD-10-CM | POA: Diagnosis present

## 2020-04-02 DIAGNOSIS — R471 Dysarthria and anarthria: Secondary | ICD-10-CM | POA: Diagnosis present

## 2020-04-02 DIAGNOSIS — I482 Chronic atrial fibrillation, unspecified: Secondary | ICD-10-CM | POA: Diagnosis not present

## 2020-04-02 DIAGNOSIS — R29714 NIHSS score 14: Secondary | ICD-10-CM | POA: Diagnosis present

## 2020-04-02 DIAGNOSIS — F0151 Vascular dementia with behavioral disturbance: Secondary | ICD-10-CM | POA: Diagnosis present

## 2020-04-02 DIAGNOSIS — I6389 Other cerebral infarction: Secondary | ICD-10-CM | POA: Diagnosis not present

## 2020-04-02 DIAGNOSIS — Z23 Encounter for immunization: Secondary | ICD-10-CM | POA: Diagnosis present

## 2020-04-02 DIAGNOSIS — J811 Chronic pulmonary edema: Secondary | ICD-10-CM

## 2020-04-02 DIAGNOSIS — I619 Nontraumatic intracerebral hemorrhage, unspecified: Secondary | ICD-10-CM

## 2020-04-02 DIAGNOSIS — I4891 Unspecified atrial fibrillation: Secondary | ICD-10-CM | POA: Diagnosis present

## 2020-04-02 DIAGNOSIS — I610 Nontraumatic intracerebral hemorrhage in hemisphere, subcortical: Secondary | ICD-10-CM

## 2020-04-02 DIAGNOSIS — Q211 Atrial septal defect: Secondary | ICD-10-CM | POA: Diagnosis not present

## 2020-04-02 DIAGNOSIS — I161 Hypertensive emergency: Secondary | ICD-10-CM | POA: Diagnosis not present

## 2020-04-02 DIAGNOSIS — I69354 Hemiplegia and hemiparesis following cerebral infarction affecting left non-dominant side: Secondary | ICD-10-CM

## 2020-04-02 DIAGNOSIS — E785 Hyperlipidemia, unspecified: Secondary | ICD-10-CM | POA: Diagnosis present

## 2020-04-02 DIAGNOSIS — Z79899 Other long term (current) drug therapy: Secondary | ICD-10-CM

## 2020-04-02 DIAGNOSIS — Z8679 Personal history of other diseases of the circulatory system: Secondary | ICD-10-CM | POA: Diagnosis not present

## 2020-04-02 DIAGNOSIS — N138 Other obstructive and reflux uropathy: Secondary | ICD-10-CM | POA: Diagnosis present

## 2020-04-02 DIAGNOSIS — Z7982 Long term (current) use of aspirin: Secondary | ICD-10-CM

## 2020-04-02 DIAGNOSIS — Z8673 Personal history of transient ischemic attack (TIA), and cerebral infarction without residual deficits: Secondary | ICD-10-CM | POA: Diagnosis not present

## 2020-04-02 DIAGNOSIS — R062 Wheezing: Secondary | ICD-10-CM

## 2020-04-02 DIAGNOSIS — H9190 Unspecified hearing loss, unspecified ear: Secondary | ICD-10-CM | POA: Diagnosis present

## 2020-04-02 DIAGNOSIS — Z20822 Contact with and (suspected) exposure to covid-19: Secondary | ICD-10-CM | POA: Diagnosis present

## 2020-04-02 DIAGNOSIS — I6930 Unspecified sequelae of cerebral infarction: Secondary | ICD-10-CM

## 2020-04-02 DIAGNOSIS — Q2112 Patent foramen ovale: Secondary | ICD-10-CM

## 2020-04-02 DIAGNOSIS — R1312 Dysphagia, oropharyngeal phase: Secondary | ICD-10-CM | POA: Diagnosis present

## 2020-04-02 DIAGNOSIS — I4811 Longstanding persistent atrial fibrillation: Secondary | ICD-10-CM | POA: Diagnosis not present

## 2020-04-02 DIAGNOSIS — I69391 Dysphagia following cerebral infarction: Secondary | ICD-10-CM

## 2020-04-02 DIAGNOSIS — E876 Hypokalemia: Secondary | ICD-10-CM | POA: Diagnosis present

## 2020-04-02 LAB — COMPREHENSIVE METABOLIC PANEL
ALT: 15 U/L (ref 0–44)
AST: 24 U/L (ref 15–41)
Albumin: 4 g/dL (ref 3.5–5.0)
Alkaline Phosphatase: 40 U/L (ref 38–126)
Anion gap: 12 (ref 5–15)
BUN: 14 mg/dL (ref 8–23)
CO2: 24 mmol/L (ref 22–32)
Calcium: 8.8 mg/dL — ABNORMAL LOW (ref 8.9–10.3)
Chloride: 104 mmol/L (ref 98–111)
Creatinine, Ser: 0.92 mg/dL (ref 0.61–1.24)
GFR calc Af Amer: >60 mL/min (ref >60)
GFR calc non Af Amer: >60 mL/min (ref >60)
Glucose, Bld: 139 mg/dL — ABNORMAL HIGH (ref 70–99)
Potassium: 3.3 mmol/L — ABNORMAL LOW (ref 3.5–5.1)
Sodium: 140 mmol/L (ref 135–145)
Total Bilirubin: 0.8 mg/dL (ref 0.3–1.2)
Total Protein: 7 g/dL (ref 6.5–8.1)

## 2020-04-02 LAB — URINALYSIS, ROUTINE W REFLEX MICROSCOPIC
Bilirubin Urine: NEGATIVE
Glucose, UA: NEGATIVE mg/dL
Ketones, ur: NEGATIVE mg/dL
Leukocytes,Ua: NEGATIVE
Nitrite: NEGATIVE
Protein, ur: NEGATIVE mg/dL
RBC / HPF: >50 RBC/hpf — ABNORMAL HIGH (ref 0–5)
Specific Gravity, Urine: 1.014 (ref 1.005–1.030)
pH: 7 (ref 5.0–8.0)

## 2020-04-02 LAB — CBC
HCT: 41.6 % (ref 39.0–52.0)
Hemoglobin: 13.1 g/dL (ref 13.0–17.0)
MCH: 28.9 pg (ref 26.0–34.0)
MCHC: 31.5 g/dL (ref 30.0–36.0)
MCV: 91.6 fL (ref 80.0–100.0)
Platelets: 330 10*3/uL (ref 150–400)
RBC: 4.54 MIL/uL (ref 4.22–5.81)
RDW: 14.6 % (ref 11.5–15.5)
WBC: 12.2 10*3/uL — ABNORMAL HIGH (ref 4.0–10.5)
nRBC: 0 % (ref 0.0–0.2)

## 2020-04-02 LAB — APTT: aPTT: 29 seconds (ref 24–36)

## 2020-04-02 LAB — DIFFERENTIAL
Abs Immature Granulocytes: 0.03 10*3/uL (ref 0.00–0.07)
Basophils Absolute: 0.1 10*3/uL (ref 0.0–0.1)
Basophils Relative: 1 %
Eosinophils Absolute: 0.2 10*3/uL (ref 0.0–0.5)
Eosinophils Relative: 1 %
Immature Granulocytes: 0 %
Lymphocytes Relative: 26 %
Lymphs Abs: 3.2 10*3/uL (ref 0.7–4.0)
Monocytes Absolute: 0.9 10*3/uL (ref 0.1–1.0)
Monocytes Relative: 7 %
Neutro Abs: 7.9 10*3/uL — ABNORMAL HIGH (ref 1.7–7.7)
Neutrophils Relative %: 65 %

## 2020-04-02 LAB — LIPID PANEL
Cholesterol: 158 mg/dL (ref 0–200)
HDL: 32 mg/dL — ABNORMAL LOW (ref >40)
LDL Cholesterol: 103 mg/dL — ABNORMAL HIGH (ref 0–99)
Total CHOL/HDL Ratio: 4.9 RATIO
Triglycerides: 116 mg/dL (ref <150)
VLDL: 23 mg/dL (ref 0–40)

## 2020-04-02 LAB — PROTIME-INR
INR: 1.1 (ref 0.8–1.2)
Prothrombin Time: 14.1 seconds (ref 11.4–15.2)

## 2020-04-02 LAB — SARS CORONAVIRUS 2 BY RT PCR (HOSPITAL ORDER, PERFORMED IN ~~LOC~~ HOSPITAL LAB): SARS Coronavirus 2: NEGATIVE

## 2020-04-02 LAB — ECHOCARDIOGRAM COMPLETE
Area-P 1/2: 3.42 cm2
S' Lateral: 2.1 cm

## 2020-04-02 LAB — HEMOGLOBIN A1C
Hgb A1c MFr Bld: 5.7 % — ABNORMAL HIGH (ref 4.8–5.6)
Mean Plasma Glucose: 116.89 mg/dL

## 2020-04-02 LAB — MRSA PCR SCREENING: MRSA by PCR: NEGATIVE

## 2020-04-02 LAB — LACTIC ACID, PLASMA: Lactic Acid, Venous: 1.9 mmol/L (ref 0.5–1.9)

## 2020-04-02 MED ORDER — SODIUM CHLORIDE 0.9 % IV BOLUS (SEPSIS)
1000.0000 mL | Freq: Once | INTRAVENOUS | Status: AC
  Administered 2020-04-02: 1000 mL via INTRAVENOUS

## 2020-04-02 MED ORDER — LABETALOL HCL 5 MG/ML IV SOLN
20.0000 mg | Freq: Once | INTRAVENOUS | Status: AC
  Administered 2020-04-02: 20 mg via INTRAVENOUS
  Filled 2020-04-02: qty 4

## 2020-04-02 MED ORDER — CHLORHEXIDINE GLUCONATE CLOTH 2 % EX PADS
6.0000 | MEDICATED_PAD | Freq: Every day | CUTANEOUS | Status: DC
  Administered 2020-04-03 – 2020-04-08 (×6): 6 via TOPICAL

## 2020-04-02 MED ORDER — STROKE: EARLY STAGES OF RECOVERY BOOK
Freq: Once | Status: DC
  Filled 2020-04-02 (×3): qty 1

## 2020-04-02 MED ORDER — ACETAMINOPHEN 650 MG RE SUPP
650.0000 mg | Freq: Once | RECTAL | Status: AC
  Administered 2020-04-02: 650 mg via RECTAL
  Filled 2020-04-02: qty 1

## 2020-04-02 MED ORDER — ACETAMINOPHEN 325 MG PO TABS: 650.0000 mg | ORAL_TABLET | ORAL | Status: DC | PRN

## 2020-04-02 MED ORDER — ACETAMINOPHEN 160 MG/5ML PO SOLN: 650.0000 mg | ORAL | Status: DC | PRN

## 2020-04-02 MED ORDER — LABETALOL HCL 5 MG/ML IV SOLN
10.0000 mg | Freq: Once | INTRAVENOUS | Status: AC
  Administered 2020-04-02: 10 mg via INTRAVENOUS
  Filled 2020-04-02: qty 4

## 2020-04-02 MED ORDER — CLEVIDIPINE BUTYRATE 0.5 MG/ML IV EMUL
0.0000 mg/h | INTRAVENOUS | Status: DC
  Administered 2020-04-02: 1 mg/h via INTRAVENOUS
  Administered 2020-04-03: 2 mg/h via INTRAVENOUS
  Filled 2020-04-02 (×2): qty 50

## 2020-04-02 MED ORDER — ACETAMINOPHEN 650 MG RE SUPP: 650.0000 mg | RECTAL | Status: DC | PRN

## 2020-04-02 MED ORDER — SENNOSIDES-DOCUSATE SODIUM 8.6-50 MG PO TABS
1.0000 | ORAL_TABLET | Freq: Two times a day (BID) | ORAL | Status: DC
  Administered 2020-04-03 – 2020-04-08 (×11): 1 via ORAL
  Filled 2020-04-02 (×11): qty 1

## 2020-04-02 MED ORDER — PANTOPRAZOLE SODIUM 40 MG IV SOLR
40.0000 mg | Freq: Every day | INTRAVENOUS | Status: DC
  Administered 2020-04-02: 40 mg via INTRAVENOUS
  Filled 2020-04-02: qty 40

## 2020-04-02 MED ORDER — LACTATED RINGERS IV SOLN: INTRAVENOUS | Status: DC

## 2020-04-02 MED ORDER — SODIUM CHLORIDE 0.9 % IV SOLN
1.0000 g | INTRAVENOUS | Status: DC
  Administered 2020-04-02 – 2020-04-03 (×2): 1 g via INTRAVENOUS
  Filled 2020-04-02: qty 1
  Filled 2020-04-02 (×2): qty 10

## 2020-04-02 MED ORDER — SODIUM CHLORIDE 0.9% FLUSH
3.0000 mL | Freq: Once | INTRAVENOUS | Status: DC
Start: 2020-04-02 — End: 2020-04-08

## 2020-04-02 NOTE — H&P (Signed)
NEURO HOSPITALIST CONSULT NOTE   Requesting Physician: Dr. Regenia Skeeter     Chief Complaint: Hemorrhagic Stroke   History obtained from:  Patient, Chart and Wife  HPI:                                                                                                                                         Jack [name] is an 86 y.o. 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 Ambulates baseline with a walker. He also has a history of atrial fibrillation not on anticoagulation, BPH, hypertension, hyperlipidemia and ventral hernia. He is followed in outpatient Neurology by Dr. Leonie Man with a diagnosis of mixed vascular dementia and alzheimer's dementia, started in 12/2018 on Aricept. Of note, his most recent neurology visit in May 2021 his blood pressure was elevated 160s-170s. At that visit his MMSE had declined to 15/30, previous was 23/30. The patient lives at home with his wife of 30+ years and has 3 sons.   The patient presented today via [facility] from home. Wife had reported 3 weeks of gait difficulty. She told EMS he went to bed last night around 9pm and woke with increased altered mental status. LKW 04/01/20 at 2100. Upon arrival he was tachycardic and febrile. A code sepsis was intitiated and ceftriaxone was started for a suspected UTI. Chest x-ray no active disease. WBC 12.2. Urine and blood cultures pending.     At 10:41 emergent head CT results were called to the ED Provider. Head CT Impression: "Intraventricular hemorrhage is noted bilaterally, but most prominently seen in the left lateral ventricle, and potentially may be originating from the left thalamus. No mass effect or midline shift is noted. Moderate chronic ischemic white matter disease is noted. Mild diffuse cortical atrophy is noted". Dr. Rory Percy with Neurology was notified and recommended activation of a Hemorrhage Code Stroke. Stroke Response, Rapid Response,  Pharmacist, Neurologist and Neurology NP responded to bedside. Initial NIHSS 14.  Neurology spoke with the patient's wife at bedside. She shared that last night he ate dinner, he sat there for a long time, she seemed to have more difficulty getting him to bed than normal and now they are here. She provided little detail on the sequence of events. She shared that he does have some baseline memory difficulty and that he is normally able to ambulate with a walker. Dr. Rory Percy reviewed head CT images with his wife, discussed the diagnosis and that he would be admitted to the ICU for monitoring. Code status and possibility of an EVD in the event of worsening were discussed with his wife, regarding what his wishes would be, she did not want to make a decision and said she would think about it more.    Date last known well: Date: 04/01/2020 Time last known well: Time: 21:00 tPA Given: No: Contraindication: Intraventricular hemorrhage  Modified Rankin: Rankin Score=3  Intracerebral Hemorrhage (ICH) Score Total:  2  Past Medical History:  Diagnosis Date  . Acute CVA (cerebrovascular accident) ([place]) 07/05/2016  . Atrial fibrillation ([place])    Dr. Tollie Eth  . BPH (benign prostatic hypertrophy) 2012   prior therapy with finasteride, but as of 03/2015 no c/o; Dr. Luanne Bras  . Cellulitis of face 11/08   [facility]  . CVA (cerebral infarction) 2009, 2015  . Echocardiogram abnormal 02/11/2010   LVEF 70%, severe mitral regurgitation, pulm HTN   . ED (erectile dysfunction)    Viagra prn, consult with Urology 2012  . Elevated PSA 2012   BPH; Dr. Luanne Bras, [facility]; no more PSAs needed as of 2012 consult  . Gait abnormality    uses cane, s/p CVA  . Hemiparesis affecting left side as late effect of cerebrovascular accident ([place])   . Hiatal hernia    per 2008 EUS  . Hyperlipidemia   . Hypertension   . Microscopic hematuria    chronic, eval 2012 with Urology, benign at that  time  . Obstructive uropathy 2012   Dr. Luanne Bras  . Pancreatic mass 01/2006   inconsequential per CT 2007  . 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 Patent foramen ovale    hx/o, notation of fistula on echocardiogram from 2009  . Prophylactic antibiotic 2016   requires due to valve disease  . Schatzki's ring 2008   per endoscopic ultrasound; Dr. Janetta Hora. [name]  . Stroke ([place])   . Ventral hernia    surgery consult 01/2010 with Dr. Excell Seltzer, elective repair if desired , but he declined  . Wears hearing aid     Past Surgical History:  Procedure Laterality Date  . CARDIAC CATHETERIZATION  02/2010   LVEF 70%, severe mitral regurgitation, pulm HTN; prior in 01/2006  . INGUINAL HERNIA REPAIR     bilat  . MITRAL VALVE REPAIR  01/2006   mitral repair with Maze procedure, tricuspid annuloplasty, MAZE and LAA oversewn by Dr. Roxy Manns    Family History  Family history unknown: Yes        Social History:  reports that he has never smoked. He has never used smokeless tobacco. He reports that he does not drink alcohol and does not use drugs.  Allergies:  Allergies  Allergen Reactions  . Sulfa Antibiotics Swelling    Swelling in groin area    Medications:                                                                                                                           I have reviewed the patient's current medications. Scheduled Meds: .  stroke: mapping our early stages of recovery book   Does not apply Once  . labetalol  20 mg Intravenous Once  . pantoprazole (PROTONIX) IV  40 mg Intravenous QHS  . senna-docusate  1 tablet Oral BID  .  sodium chloride flush  3 mL Intravenous Once   Continuous Infusions: . cefTRIAXone (ROCEPHIN)  IV Stopped (04/02/20 1115)  . clevidipine Stopped (04/02/20 1316)  . lactated ringers 150 mL/hr at 04/02/20 1041   PRN Meds:.acetaminophen **OR** acetaminophen (TYLENOL) oral liquid 160  mg/5 mL **OR** acetaminophen ROS:                                                                                                                                       History obtained from unobtainable from patient due to mental status  General Examination:                                                                                                      Blood pressure 135/64, pulse 92, temperature 98.6 F (37 C), temperature source Oral, resp. rate 19, SpO2 97 %.  Physical Exam  Constitutional: Appears well-developed and well-nourished.  Psych: Affect appropriate to situation Eyes: Normal external eye and conjunctiva. HENT: Normocephalic, no lesions, without obvious abnormality.   Musculoskeletal-no joint tenderness, deformity or swelling Cardiovascular: Bedside telemetry Respiratory: Effort normal, non-labored breathing saturations WNL GI: Soft.  No distension. There is no tenderness.  Skin: WDI  Neurological Examination Mental Status: Drowsy, oriented to self only. Brief answers, able to name some simple objects when asked.  Able to follow simple commands when asked repeatedly.  Cranial Nerves: II:  Visual fields grossly normal,  III,IV, VI: ptosis not present, extra-ocular motions intact bilaterally,  V,VII: left droop, facial light touch sensation normal bilaterally VIII: hearing normal bilaterally Tone and bulk:normal tone throughout; no atrophy noted Sensory:  Able to feel sensation bilaterally, patient unable to say if one side is different  NIHSS LOC: 1 LOC questions: 0 LOC commands: 1 Gaze: 0 Visual: 0 Facial: 1 L Arm: 2 R Arm : 1 L Leg: 2 R Leg: 1 Ataxia: 0 Sensation: 0 Language: 2 Dysarthria: 1 Extinction: 0 NIHSS: 14     Lab Results: Basic Metabolic Panel: Recent Labs  Lab 04/02/20 0929  NA 140  K 3.3*  CL 104  CO2 24  GLUCOSE 139*  BUN 14  CREATININE 0.92  CALCIUM 8.8*    CBC: Recent Labs  Lab 04/02/20 0929  WBC 12.2*  NEUTROABS  7.9*  HGB 13.1  HCT 41.6  MCV 91.6  PLT 330    Lipid Panel: No results for input(s): CHOL, TRIG, HDL, CHOLHDL, VLDL, LDLCALC in the last 168 hours.  CBG: No results for input(s): GLUCAP in the  last 168 hours.  Imaging: CT HEAD WO CONTRAST  Result Date: 04/02/2020 CLINICAL DATA:  Altered mental status. EXAM: CT HEAD WITHOUT CONTRAST TECHNIQUE: Contiguous axial images were obtained from the base of the skull through the vertex without intravenous contrast. COMPARISON:  December 27, 2018. FINDINGS: Brain: Moderate chronic ischemic white matter disease is noted. Mild diffuse cortical atrophy is noted. Large amount hemorrhage is noted in the left frontal horn as well as small amount of hemorrhage seen in both posterior horns. Potentially the hemorrhage may be originating in the left thalamus. No mass effect or midline shift is noted. No definite evidence of mass lesion or acute infarction is noted. Vascular: No hyperdense vessel or unexpected calcification. Skull: Normal. Negative for fracture or focal lesion. Sinuses/Orbits: No acute finding. Other: None. IMPRESSION: Intraventricular hemorrhage is noted bilaterally, but most prominently seen in the left lateral ventricle, and potentially may be originating from the left thalamus. No mass effect or midline shift is noted. Moderate chronic ischemic white matter disease is noted. Mild diffuse cortical atrophy is noted. Critical Value/emergent results were called by telephone at the time of interpretation on 04/02/2020 at 10:41 am to provider Sherwood Gambler , who verbally acknowledged these results. Electronically Signed   By: Marijo Conception M.D.   On: 04/02/2020 10:41   DG Chest Portable 1 View  Result Date: 04/02/2020 CLINICAL DATA:  Altered mental status EXAM: PORTABLE CHEST 1 VIEW COMPARISON:  02/23/2019 FINDINGS: Prior median sternotomy and valve replacement. Heart is normal size. No confluent opacities or effusions. No acute bony abnormality.  IMPRESSION: No active disease. Electronically Signed   By: Rolm Baptise M.D.   On: 04/02/2020 10:37     Assessment: 86 y.o. male with a history of right thalamic ICH in 2015 on anticoagulation with residual left hemiparesis. History of atrial fibrillation not on anticoagulation, BPH, hypertension, hyperlipidemia, ventral hernia, mixed vascular and alzheimer's dementia with most recent MMSE 15/30. He lives at home with his wife and ambulates with a walker baseline.   He presented today via EMS after waking with altered mental status. LKW 2100. Head CT identified Intraventricular hemorrhage most prominent in the left lateral ventricle. A code hemorrhage was activated. The patient is being admitted to the ICU under the neurology service. Goals of care were discussed with his wife, however she was not ready to make any decisions. The patient does have 3 sons, his wife reports 66 is in [place], 1 lives in [place] and the other lives in [place].     Recommendations:  Admission to Neuro ICU under neurology service  Repeat CT at 18:00 to evaluate hemorrahge  BP Goal: SBP<140. PRN Cleviprex  Currently on ceftriaxone for suspected UTI: Culture Pending  PT consult, OT consult, Speech consult  Telemetry monitoring  Frequent neuro checks  NPO until passes stroke swallow screen  Discussed with ED team that neurology will likely eventually get MRI/MRA of the brain, however not urgent in this specific situation.   Gwenyth Bouillon DNP, NP-C [facility] Nurse Practitioner  Patient examined and family updated by Dr. Rory Percy Note review to follow by attending neurologist   04/02/2020, 12:37 PM

## 2020-04-02 NOTE — Evaluation (Signed)
Clinical/Bedside Swallow Evaluation Patient Details  Name: Jack [name] MRN: [id number] Date of Birth: 02-23-1934  Today's Date: 04/02/2020 Time: SLP Start Time (ACUTE ONLY): 1522 SLP Stop Time (ACUTE ONLY): 1541 SLP Time Calculation (min) (ACUTE ONLY): 19 min  Past Medical History:  Past Medical History:  Diagnosis Date  . Acute CVA (cerebrovascular accident) ([place]) 07/05/2016  . Atrial fibrillation ([place])    Dr. Tollie Eth  . BPH (benign prostatic hypertrophy) 2012   prior therapy with finasteride, but as of 03/2015 no c/o; Dr. Luanne Bras  . Cellulitis of face 11/08   [facility]NT  . CVA (cerebral infarction) 2009, 2015  . Echocardiogram abnormal 02/11/2010   LVEF 70%, severe mitral regurgitation, pulm HTN   . ED (erectile dysfunction)    Viagra prn, consult with Urology 2012  . Elevated PSA 2012   BPH; Dr. Luanne Bras, [facility]; no more PSAs needed as of 2012 consult  . Gait abnormality    uses cane, s/p CVA  . Hemiparesis affecting left side as late effect of cerebrovascular accident ([place])   . Hiatal hernia    per 2008 EUS  . Hyperlipidemia   . Hypertension   . Microscopic hematuria    chronic, eval 2012 with Urology, benign at that time  . Obstructive uropathy 2012   Dr. Luanne Bras  . Pancreatic mass 01/2006   inconsequential per CT 2007  . 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 Patent foramen ovale    hx/o, notation of fistula on echocardiogram from 2009  . Prophylactic antibiotic 2016   requires due to valve disease  . Schatzki's ring 2008   per endoscopic ultrasound; Dr. Janetta Hora. [name]  . Stroke ([place])   . Ventral hernia    surgery consult 01/2010 with Dr. Excell Seltzer, elective repair if desired , but he declined  . Wears hearing aid    Past Surgical History:  Past Surgical History:  Procedure Laterality Date  . CARDIAC CATHETERIZATION  02/2010   LVEF 70%, severe mitral  regurgitation, pulm HTN; prior in 01/2006  . INGUINAL HERNIA REPAIR     bilat  . MITRAL VALVE REPAIR  01/2006   mitral repair with Maze procedure, tricuspid annuloplasty, MAZE and LAA oversewn by Dr. Roxy Manns   HPI:  Pt is an 86 yo male presenting with AMS. CT Head showed IVH bilaterally but most prominently seen in the L lateral ventricle and potentially originating from the L thalamus. PMH includes: hearing loss, multiple CVAs, schatzki's ring, HTN, HLD, HH, and dementia (MMSE 15/30 in 11/2019). Pt was seen for swallowing in 2015 s/p CVA, initially started on nectar thick liquids but advanced to thin liquids after MBS.   Assessment / Plan / Recommendation Clinical Impression  Pt presents with difficulty swallowing that is impacted also by his current mentation. Each time he exhales he is blowing outwardly, resulting in trouble with oral coordination as he tries to blow out with boluses in his mouth. He also blows bubbles through the straw unless given heavy cueing to initiate sucking. Cueing is faded as task continues. An immediate cough was noted on the first sip of water, but not with any subsequent boluses. Given current mentation I do not think he would be ready for a PO diet, but could start meds crushed in puree. Could also provide a few, single pieces of ice and/or a few small, single sips of water after oral care is performed. SLP will f/u for potential  to initiate PO diet.   SLP Visit Diagnosis: Dysphagia, unspecified (R13.10)    Aspiration Risk  Moderate aspiration risk    Diet Recommendation NPO except meds;Ice chips PRN after oral care   Medication Administration: Crushed with puree    Other  Recommendations Oral Care Recommendations: Oral care QID Other Recommendations: Have oral suction available   Follow up Recommendations  (tba)      Frequency and Duration min 2x/week  2 weeks       Prognosis Prognosis for Safe Diet Advancement: Good Barriers to Reach Goals: Cognitive  deficits      Swallow Study   General HPI: Pt is an 86 yo male presenting with AMS. CT Head showed IVH bilaterally but most prominently seen in the L lateral ventricle and potentially originating from the L thalamus. PMH includes: hearing loss, multiple CVAs, schatzki's ring, HTN, HLD, HH, and dementia (MMSE 15/30 in 11/2019). Pt was seen for swallowing in 2015 s/p CVA, initially started on nectar thick liquids but advanced to thin liquids after MBS. Type of Study: Bedside Swallow Evaluation Previous Swallow Assessment: see HPI Diet Prior to this Study: NPO Temperature Spikes Noted: No Respiratory Status: Room air History of Recent Intubation: No Behavior/Cognition: Alert;Cooperative;Confused;Requires cueing Oral Cavity Assessment: Within Functional Limits Oral Care Completed by SLP: No Oral Cavity - Dentition: Adequate natural dentition Self-Feeding Abilities: Needs assist Patient Positioning: Upright in bed Baseline Vocal Quality: Normal Volitional Swallow: Unable to elicit    Oral/Motor/Sensory Function Overall Oral Motor/Sensory Function:  (trouble following commands)   Ice Chips Ice chips: Impaired Presentation: Spoon Oral Phase Functional Implications: Oral holding   Thin Liquid Thin Liquid: Impaired Presentation: Spoon;Straw Oral Phase Impairments: Poor awareness of bolus Pharyngeal  Phase Impairments: Cough - Immediate    Nectar Thick Nectar Thick Liquid: Not tested   Honey Thick Honey Thick Liquid: Not tested   Puree Puree: Impaired Presentation: Spoon Oral Phase Impairments: Poor awareness of bolus   Solid     Solid: Not tested      Osie Bond., M.A. [facility] Pager [phone] Office [phone]  04/02/2020,3:54 PM

## 2020-04-02 NOTE — Progress Notes (Signed)
*  PRELIMINARY RESULTS* Echocardiogram 2D Echocardiogram has been performed.  Jack [name] 04/02/2020, 1:35 PM

## 2020-04-02 NOTE — ED Notes (Signed)
Stroke RN, Rapid RN x 2, neurologist at bedside

## 2020-04-02 NOTE — ED Provider Notes (Signed)
[facility] EMERGENCY DEPARTMENT Provider Note   CSN: [id number] Arrival date & time: 04/02/20  5038     History Chief Complaint  Patient presents with  . Altered Mental Status    Jack [name] is a 86 y.o. male.  HPI   86 year old male with a history of CVA, A. fib, BPH, left-sided hemiparesis s/p CVA, hyperlipidemia, hypertension, obstructive uropathy, Schatzki's ring, who presents to the emergency department today for evaluation of altered mental status.  Per EMS, patient coming from home.  Wife reported that he has been having gait problems for the last 3 weeks.  Patient reportedly woke up this morning with increased altered mental status and foul-smelling urine.  He was given five albuterol with EMS prior to arrival due to increased wheezing.  CBG was 115.  Level five caveat as patient unable to provide any reliable history.  10:17 PM Spoke with the patients wife, Jack [name]. She states that last night he became confused and had trouble picking both of his feet up.  At baseline, pt is conversant and is normally oriented to self and place. He does have some baseline confusion.  She denies that he has had any fevers, coughing, vomiting, or having diarrhea. Pt has been vaccinated.   Past Medical History:  Diagnosis Date  . Acute CVA (cerebrovascular accident) ([place]) 07/05/2016  . Atrial fibrillation ([place])    Dr. Tollie Eth  . BPH (benign prostatic hypertrophy) 2012   prior therapy with finasteride, but as of 03/2015 no c/o; Dr. Luanne Bras  . Cellulitis of face 11/08   [facility]  . CVA (cerebral infarction) 2009, 2015  . Echocardiogram abnormal 02/11/2010   LVEF 70%, severe mitral regurgitation, pulm HTN   . ED (erectile dysfunction)    Viagra prn, consult with Urology 2012  . Elevated PSA 2012   BPH; Dr. Luanne Bras, [facility]; no more PSAs needed as of 2012 consult  . Gait abnormality    uses cane, s/p CVA  . Hemiparesis  affecting left side as late effect of cerebrovascular accident ([place])   . Hiatal hernia    per 2008 EUS  . Hyperlipidemia   . Hypertension   . Microscopic hematuria    chronic, eval 2012 with Urology, benign at that time  . Obstructive uropathy 2012   Dr. Luanne Bras  . Pancreatic mass 01/2006   inconsequential per CT 2007  . 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 Patent foramen ovale    hx/o, notation of fistula on echocardiogram from 2009  . Prophylactic antibiotic 2016   requires due to valve disease  . Schatzki's ring 2008   per endoscopic ultrasound; Dr. Janetta Hora. [name]  . Stroke ([place])   . Ventral hernia    surgery consult 01/2010 with Dr. Excell Seltzer, elective repair if desired , but he declined  . Wears hearing aid     Patient Active Problem List   Diagnosis Date Noted  . Lesion of right ear 01/29/2019  . Vascular dementia with behavior disturbance ([place]) 01/29/2019  . HOH (hard of hearing) 01/29/2019  . Counseling regarding advanced directives 01/26/2019  . Benign prostatic hyperplasia with urinary frequency 01/26/2019  . Status post coronary artery bypass graft 05/05/2018  . Hyperglycemia 03/10/2017  . Elevated PSA, less than 10 ng/ml 03/10/2017  . Subcortical infarction ([place]) 07/09/2016  . PFO (patent foramen ovale)   . History of CVA with residual deficit   . Non compliance  with medical treatment   . Hypokalemia   . Acute blood loss anemia   . Embolic stroke involving left middle cerebral artery ([place])   . TIA (transient ischemic attack) 07/03/2016  . Right sided weakness 07/03/2016  . Microscopic hematuria 04/03/2015  . Hyperlipidemia 04/03/2015  . Wears hearing aid 04/03/2015  . Changing skin lesion 04/03/2015  . Paresthesia and pain of left extremity 04/02/2015  . Encounter for health maintenance examination in adult 04/02/2015  . Hernia of abdominal cavity 04/02/2015  . Impaired gait and mobility  04/02/2015  . Essential hypertension 03/03/2015  . Spastic hemiplegia affecting nondominant side ([place]) 12/14/2013  . ICH (intracerebral hemorrhage) ([place]) 08/02/2013  . Status post mitral valve repair   . History of stroke   . History of atrial fibrillation   . Cyst and pseudocyst of pancreas 09/13/2007  . PVC (premature ventricular contraction)   . History of renal cyst     Past Surgical History:  Procedure Laterality Date  . CARDIAC CATHETERIZATION  02/2010   LVEF 70%, severe mitral regurgitation, pulm HTN; prior in 01/2006  . INGUINAL HERNIA REPAIR     bilat  . MITRAL VALVE REPAIR  01/2006   mitral repair with Maze procedure, tricuspid annuloplasty, MAZE and LAA oversewn by Dr. Roxy Manns       Family History  Family history unknown: Yes    Social History   Tobacco Use  . Smoking status: Never Smoker  . Smokeless tobacco: Never Used  Substance Use Topics  . Alcohol use: No  . Drug use: No    Home Medications Prior to Admission medications   Medication Sig Start Date End Date Taking? Authorizing Provider  albuterol (VENTOLIN HFA) 108 (90 Base) MCG/ACT inhaler Inhale 1 puff into the lungs every 6 (six) hours as needed for wheezing or shortness of breath. 02/23/19   Nche, Charlene Brooke, NP  amLODipine (NORVASC) 5 MG tablet Take 1 tablet (5 mg total) by mouth in the morning and at bedtime. No additional refills without office visit 01/20/20   Nche, Charlene Brooke, NP  aspirin EC 81 MG tablet Take 1 tablet (81 mg total) by mouth daily. 01/03/19   Fenton, Clint R, PA  Cholecalciferol (VITAMIN D3 PO) Take by mouth.    [provider]  donepezil (ARICEPT) 10 MG tablet Take 1 tablet (10 mg total) by mouth at bedtime. 11/28/19   Frann Rider, NP  olmesartan (BENICAR) 40 MG tablet Take 1 tablet (40 mg total) by mouth daily. No additional refills without and office visit. 02/27/20   Nche, Charlene Brooke, NP  tamsulosin (FLOMAX) 0.4 MG CAPS capsule Take 0.4 mg by mouth.    [provider]    Allergies    Sulfa antibiotics  Review of Systems   Review of Systems  Unable to perform ROS: Mental status change    Physical Exam Updated Vital Signs BP 135/64   Pulse 92   Temp 98.6 F (37 C) (Oral)   Resp 19   SpO2 97%   Physical Exam Vitals and nursing note reviewed.  Constitutional:      Appearance: He is well-developed.  HENT:     Head: Normocephalic and atraumatic.  Eyes:     Conjunctiva/sclera: Conjunctivae normal.     Pupils: Pupils are equal, round, and reactive to light.  Cardiovascular:     Rate and Rhythm: Regular rhythm. Tachycardia present.     Heart sounds: No murmur heard.   Pulmonary:     Breath sounds: Wheezing  present.     Comments: Course breath sounds anteriorly, tachypneic Abdominal:     General: Bowel sounds are normal.     Palpations: Abdomen is soft.     Tenderness: There is no abdominal tenderness.     Comments: Large ventral hernia noted  Musculoskeletal:     Cervical back: Neck supple.  Skin:    General: Skin is warm and dry.  Neurological:     Mental Status: He is alert.     Comments: Somnolent, but eyes open to voice. Not following commands.     ED Results / Procedures / Treatments   Labs (all labs ordered are listed, but only abnormal results are displayed) Labs Reviewed  CBC - Abnormal; Notable for the following components:      Result Value   WBC 12.2 (*)    All other components within normal limits  DIFFERENTIAL - Abnormal; Notable for the following components:   Neutro Abs 7.9 (*)    All other components within normal limits  COMPREHENSIVE METABOLIC PANEL - Abnormal; Notable for the following components:   Potassium 3.3 (*)    Glucose, Bld 139 (*)    Calcium 8.8 (*)    All other components within normal limits  URINALYSIS, ROUTINE W REFLEX MICROSCOPIC - Abnormal; Notable for the following components:   APPearance HAZY (*)    Hgb urine dipstick MODERATE (*)    RBC / HPF >50 (*)    Bacteria, UA  RARE (*)    All other components within normal limits  SARS CORONAVIRUS 2 BY RT PCR (HOSPITAL ORDER, [place] [facility])  CULTURE, BLOOD (ROUTINE X 2)  URINE CULTURE  CULTURE, BLOOD (ROUTINE X 2)  PROTIME-INR  APTT  LACTIC ACID, PLASMA  HEMOGLOBIN A1C  LIPID PANEL  CBG MONITORING, ED    EKG EKG Interpretation  Date/Time:  Wednesday April 02 2020 09:20:30 EDT Ventricular Rate:  127 PR Interval:    QRS Duration: 72 QT Interval:  288 QTC Calculation: 418 R Axis:   -23 Text Interpretation: Atrial fibrillation with rapid ventricular response with premature ventricular or aberrantly conducted complexes Cannot rule out Anterior infarct , age undetermined Abnormal ECG Confirmed by Sherwood Gambler [phone]) on 04/02/2020 10:11:12 AM   Radiology CT HEAD WO CONTRAST  Result Date: 04/02/2020 CLINICAL DATA:  Altered mental status. EXAM: CT HEAD WITHOUT CONTRAST TECHNIQUE: Contiguous axial images were obtained from the base of the skull through the vertex without intravenous contrast. COMPARISON:  December 27, 2018. FINDINGS: Brain: Moderate chronic ischemic white matter disease is noted. Mild diffuse cortical atrophy is noted. Large amount hemorrhage is noted in the left frontal horn as well as small amount of hemorrhage seen in both posterior horns. Potentially the hemorrhage may be originating in the left thalamus. No mass effect or midline shift is noted. No definite evidence of mass lesion or acute infarction is noted. Vascular: No hyperdense vessel or unexpected calcification. Skull: Normal. Negative for fracture or focal lesion. Sinuses/Orbits: No acute finding. Other: None. IMPRESSION: Intraventricular hemorrhage is noted bilaterally, but most prominently seen in the left lateral ventricle, and potentially may be originating from the left thalamus. No mass effect or midline shift is noted. Moderate chronic ischemic white matter disease is noted. Mild diffuse cortical atrophy  is noted. Critical Value/emergent results were called by telephone at the time of interpretation on 04/02/2020 at 10:41 am to provider Sherwood Gambler , who verbally acknowledged these results. Electronically Signed   By: Marijo Conception  M.D.   On: 04/02/2020 10:41   DG Chest Portable 1 View  Result Date: 04/02/2020 CLINICAL DATA:  Altered mental status EXAM: PORTABLE CHEST 1 VIEW COMPARISON:  02/23/2019 FINDINGS: Prior median sternotomy and valve replacement. Heart is normal size. No confluent opacities or effusions. No acute bony abnormality. IMPRESSION: No active disease. Electronically Signed   By: Rolm Baptise M.D.   On: 04/02/2020 10:37    Procedures Procedures (including critical care time)  CRITICAL CARE Performed by: Rodney Booze   Total critical care time: 37 minutes  Critical care time was exclusive of separately billable procedures and treating other patients.  Critical care was necessary to treat or prevent imminent or life-threatening deterioration.  Critical care was time spent personally by me on the following activities: development of treatment plan with patient and/or surrogate as well as nursing, discussions with consultants, evaluation of patient's response to treatment, examination of patient, obtaining history from patient or surrogate, ordering and performing treatments and interventions, ordering and review of laboratory studies, ordering and review of radiographic studies, pulse oximetry and re-evaluation of patient's condition.   Medications Ordered in ED Medications  sodium chloride flush (NS) 0.9 % injection 3 mL (3 mLs Intravenous Not Given 04/02/20 1048)  lactated ringers infusion ( Intravenous New Bag/Given 04/02/20 1041)  cefTRIAXone (ROCEPHIN) 1 g in sodium chloride 0.9 % 100 mL IVPB ( Intravenous Stopped 04/02/20 1115)   stroke: mapping our early stages of recovery book (has no administration in time range)  acetaminophen (TYLENOL) tablet 650 mg (has no  administration in time range)    Or  acetaminophen (TYLENOL) 160 MG/5ML solution 650 mg (has no administration in time range)    Or  acetaminophen (TYLENOL) suppository 650 mg (has no administration in time range)  senna-docusate (Senokot-S) tablet 1 tablet (has no administration in time range)  pantoprazole (PROTONIX) injection 40 mg (has no administration in time range)  labetalol (NORMODYNE) injection 20 mg (has no administration in time range)    And  clevidipine (CLEVIPREX) infusion 0.5 mg/mL (has no administration in time range)  sodium chloride 0.9 % bolus 1,000 mL (0 mLs Intravenous Stopped 04/02/20 1251)  acetaminophen (TYLENOL) suppository 650 mg (650 mg Rectal Given 04/02/20 1043)  labetalol (NORMODYNE) injection 10 mg (10 mg Intravenous Given 04/02/20 1048)    ED Course  I have reviewed the triage vital signs and the nursing notes.  Pertinent labs & imaging results that were available during my care of the patient were reviewed by me and considered in my medical decision making (see chart for details).    MDM Rules/Calculators/A&P                          86 year old male presenting for evaluation of altered mental status that started last night.  History of CVA.  On arrival found to be tachycardic, febrile.  No hypotension and normal respiratory status.  Code sepsis initiated based on tachycardia, fever.  Initial suspected source of infection is UTI therefore ceftriaxone given.  Reviewed/interpreted lab CBC with leukocytosis, no anemia CMP with mild hypokalemia, otherwise reassuring coags wnl UA w/o evidence for UTI. Culture sent. COVID neg Lactic acid Blood cultures  CXR  No active disease.  CT head reviewed/interpreted - Intraventricular hemorrhage is noted bilaterally, but most prominently seen in the left lateral ventricle, and potentially may be originating from the left thalamus. No mass effect or midline shift is noted. Moderate chronic ischemic white matter  disease  is noted. Mild diffuse cortical atrophy is noted.   - labetolol ordered by dr Regenia Skeeter for bp control  11:05 AM Supervising physician, Dr. Regenia Skeeter spoke with Dr. Lorraine Lax of neurology regarding CT head. Recommends calling code stroke - hemorrhage which was completed. Pt admitted to neuro ICU  Final Clinical Impression(s) / ED Diagnoses Final diagnoses:  Hemorrhagic stroke [facility])    Rx / DC Orders ED Discharge Orders    None       Bishop Dublin 04/02/20 1302    Sherwood Gambler, MD 04/03/20 [phone]

## 2020-04-02 NOTE — Code Documentation (Signed)
Stroke Response Nurse Documentation Code Documentation  GRANGER CHUI is a 86 y.o. male arriving to [facility] ED via [facility] on 04/02/20 with past medical hx significant of hypertension, stroke, atrial fibrillation, hyperlipidemia, and patent foramen ovale.   Patient from home where his wife reported to the ED he has been having gait problems for the last 3 weeks. He was brought in today for altered mental status which she discovered when he woke up this morning. LKW regarding altered mental status is 04/01/20 2000.   Patient's wife also reported foul smelling urine. Patient presented to ED febrile and tachycardic. Urine and blood culture ordered, labs drawn and CT head completed. CT revealed intraventricular hemorrhage. Neurologist notified of CT results. Code stroke (hemorrhage) was activated by neurologist.   Stroke team at the bedside in ED. Patient receiving fluid bolus for possible sepsis. NIHSS 12, see documentation for details and code stroke times. Patient with disoriented, not following commands, left facial droop, left arm weakness, bilateral leg weakness, Expressive aphasia  and dysarthria  on exam.    Patient is not a candidate for tPA due to hemorrhage. Care/Plan- provider to speak with patient's wife to determine when LKW is and baseline status of patient. Follow hemorrhagic stroke protocol- SBP<140 and Q1 neuro checks. Bedside handoff with ED RN Raquel Sarna.    Leverne Humbles Stroke Response RN

## 2020-04-02 NOTE — ED Provider Notes (Signed)
Patient with acute altered mental status.  At baseline he has dementia and walks with a walker but since last night he has been more altered than typical.  Started on sepsis protocol given low-grade rectal temperature but head CT unfortunately shows intraventricular hemorrhage.  Blood pressure was near 160 so he was given a small dose of labetalol which has helped.  Neurology has been consulted and he will be admitted to the ICU.   CRITICAL CARE Performed by: Ephraim Hamburger   Total critical care time: 40 minutes  Critical care time was exclusive of separately billable procedures and treating other patients.  Critical care was necessary to treat or prevent imminent or life-threatening deterioration.  Critical care was time spent personally by me on the following activities: development of treatment plan with patient and/or surrogate as well as nursing, discussions with consultants, evaluation of patient's response to treatment, examination of patient, obtaining history from patient or surrogate, ordering and performing treatments and interventions, ordering and review of laboratory studies, ordering and review of radiographic studies, pulse oximetry and re-evaluation of patient's condition.    Sherwood Gambler, MD 04/02/20 1253

## 2020-04-02 NOTE — ED Triage Notes (Signed)
Pt to triage via [facility] from home.  Wife reports gait problems x 3 weeks.  Went to bed at 9pm last night and woke up this morning with increased AMS.  CBG 115.  Foul urine smell.  Hx of stroke.  18g LFA.  Albuterol 5mg  given by EMS PTA.

## 2020-04-03 ENCOUNTER — Inpatient Hospital Stay (HOSPITAL_COMMUNITY): Payer: Medicare Other

## 2020-04-03 DIAGNOSIS — I615 Nontraumatic intracerebral hemorrhage, intraventricular: Principal | ICD-10-CM

## 2020-04-03 DIAGNOSIS — J810 Acute pulmonary edema: Secondary | ICD-10-CM

## 2020-04-03 DIAGNOSIS — I482 Chronic atrial fibrillation, unspecified: Secondary | ICD-10-CM

## 2020-04-03 DIAGNOSIS — Z8679 Personal history of other diseases of the circulatory system: Secondary | ICD-10-CM

## 2020-04-03 DIAGNOSIS — Z8673 Personal history of transient ischemic attack (TIA), and cerebral infarction without residual deficits: Secondary | ICD-10-CM

## 2020-04-03 LAB — URINE CULTURE: Culture: <10000 — AB

## 2020-04-03 LAB — CBC
HCT: 36.7 % — ABNORMAL LOW (ref 39.0–52.0)
Hemoglobin: 11.6 g/dL — ABNORMAL LOW (ref 13.0–17.0)
MCH: 29 pg (ref 26.0–34.0)
MCHC: 31.6 g/dL (ref 30.0–36.0)
MCV: 91.8 fL (ref 80.0–100.0)
Platelets: 255 10*3/uL (ref 150–400)
RBC: 4 MIL/uL — ABNORMAL LOW (ref 4.22–5.81)
RDW: 14.6 % (ref 11.5–15.5)
WBC: 11 10*3/uL — ABNORMAL HIGH (ref 4.0–10.5)
nRBC: 0 % (ref 0.0–0.2)

## 2020-04-03 MED ORDER — PANTOPRAZOLE SODIUM 40 MG PO TBEC
40.0000 mg | DELAYED_RELEASE_TABLET | Freq: Every day | ORAL | Status: DC
  Administered 2020-04-03 – 2020-04-08 (×5): 40 mg via ORAL
  Filled 2020-04-03 (×6): qty 1

## 2020-04-03 MED ORDER — POTASSIUM CHLORIDE 20 MEQ PO PACK
40.0000 meq | PACK | ORAL | Status: AC
  Administered 2020-04-03: 40 meq via ORAL
  Filled 2020-04-03 (×2): qty 2

## 2020-04-03 MED ORDER — SODIUM CHLORIDE 0.9 % IV SOLN: INTRAVENOUS | Status: DC

## 2020-04-03 MED ORDER — FUROSEMIDE 10 MG/ML IJ SOLN
40.0000 mg | Freq: Once | INTRAMUSCULAR | Status: AC
  Administered 2020-04-03: 40 mg via INTRAVENOUS
  Filled 2020-04-03: qty 4

## 2020-04-03 MED ORDER — QUETIAPINE FUMARATE 25 MG PO TABS
25.0000 mg | ORAL_TABLET | Freq: Every day | ORAL | Status: DC
  Administered 2020-04-03 – 2020-04-07 (×5): 25 mg via ORAL
  Filled 2020-04-03 (×5): qty 1

## 2020-04-03 MED ORDER — AMLODIPINE BESYLATE 5 MG PO TABS
5.0000 mg | ORAL_TABLET | Freq: Two times a day (BID) | ORAL | Status: DC
  Administered 2020-04-03 – 2020-04-08 (×10): 5 mg via ORAL
  Filled 2020-04-03 (×10): qty 1

## 2020-04-03 NOTE — Progress Notes (Signed)
Rehab Admissions Coordinator Note:  Patient was screened by Cleatrice Burke for appropriateness for an Inpatient Acute Rehab Consult per therapy rec.s .  At this time, we are recommending Inpatient Rehab consult. I will place order per protocol.  Cleatrice Burke RN MSN 04/03/2020, 3:58 PM  I can be reached at [phone].

## 2020-04-03 NOTE — Progress Notes (Signed)
SLP Cancellation Note  Patient Details Name: Jack [name] MRN: [id number] DOB: Sep 24, 1933   Cancelled treatment:       Reason Eval/Treat Not Completed: Other (comment) (Radiology staff reported that transport arrived for pt but after waiting 45 minutes for the pt, RN indicated that the pt was being combative and was not responsive to redirection or following commands and therefore will not be able to participate in the modified barium swallow study. SLP will follow up on subsequent date.)  Bernal Luhman I. Hardin Negus, [place], [place] Office number [phone] Pager [place] 04/03/2020, 2:58 PM

## 2020-04-03 NOTE — Consult Note (Signed)
Physical Medicine and Rehabilitation Consult   Reason for Consult: [place] Referring Physician: Dr. Erlinda Hong   HPI: Jack [name] is a 86 y.o. male with history of L-MCA infarct with residual L-HP and A fib --no anticoagulation due to prior ICH, vascular dementia 04/02/2020 with low-grade fever and worsening of confusion.   CT head done showing suspicion of hemorrhage from left basal ganglia, left caudate region felt to be hypertensive in nature as well as extensive microvascular changes. He was started on IV antibiotics as well as Cleviprex.   ASA discontinued and bleed felt to be likely CVA in nature. 2D echo showed EF 65 to 70% with mild LVH, moderate TVR and biatrial dilatation.  He developed acute pulmonary edema with shortness of breath 09/23 and was treated with dose of Lasix.  He remains n.p.o. due to combativeness and inability to participate in MBS.   Per reports, at baseline patient was able to ambulate with use of rolling walker.  PT evaluations completed and CIR recommended due to functional decline.   Review of Systems  Unable to perform ROS: Mental acuity     Past Medical History:  Diagnosis Date  . Acute CVA (cerebrovascular accident) ([place]) 07/05/2016  . Atrial fibrillation ([place])    Dr. Tollie Eth  . BPH (benign prostatic hypertrophy) 2012   prior therapy with finasteride, but as of 03/2015 no c/o; Dr. Luanne Bras  . Cellulitis of face 11/08   [facility]  . CVA (cerebral infarction) 2009, 2015  . Echocardiogram abnormal 02/11/2010   LVEF 70%, severe mitral regurgitation, pulm HTN   . ED (erectile dysfunction)    Viagra prn, consult with Urology 2012  . Elevated PSA 2012   BPH; Dr. Luanne Bras, [facility]; no more PSAs needed as of 2012 consult  . Gait abnormality    uses cane, s/p CVA  . Hemiparesis affecting left side as late effect of cerebrovascular accident ([place])   . Hiatal hernia    per 2008 EUS  . Hyperlipidemia   . Hypertension   .  Microscopic hematuria    chronic, eval 2012 with Urology, benign at that time  . Obstructive uropathy 2012   Dr. Luanne Bras  . Pancreatic mass 01/2006   inconsequential per CT 2007  . 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 Patent foramen ovale    hx/o, notation of fistula on echocardiogram from 2009  . Prophylactic antibiotic 2016   requires due to valve disease  . Schatzki's ring 2008   per endoscopic ultrasound; Dr. Janetta Hora. [name]  . Stroke ([place])   . Ventral hernia    surgery consult 01/2010 with Dr. Excell Seltzer, elective repair if desired , but he declined  . Wears hearing aid     Past Surgical History:  Procedure Laterality Date  . CARDIAC CATHETERIZATION  02/2010   LVEF 70%, severe mitral regurgitation, pulm HTN; prior in 01/2006  . INGUINAL HERNIA REPAIR     bilat  . MITRAL VALVE REPAIR  01/2006   mitral repair with Maze procedure, tricuspid annuloplasty, MAZE and LAA oversewn by Dr. Roxy Manns    Family History  Family history unknown: Yes--unable to elicit due to cognitive deficits.     Social History:  Married. Per reports that he has never smoked. He has never used smokeless tobacco.Per reports he does not drink alcohol and does not use drugs.    Allergies  Allergen Reactions  . Sulfa Antibiotics  Swelling    Swelling in groin area   Medications Prior to Admission  Medication Sig Dispense Refill  . amLODipine (NORVASC) 5 MG tablet Take 1 tablet (5 mg total) by mouth in the morning and at bedtime. No additional refills without office visit (Patient taking differently: Take 5 mg by mouth in the morning and at bedtime. ) 60 tablet 0  . aspirin EC 81 MG tablet Take 1 tablet (81 mg total) by mouth daily.    . Cholecalciferol (VITAMIN D3 PO) Take 1 tablet by mouth daily.     Marland Kitchen donepezil (ARICEPT) 10 MG tablet Take 1 tablet (10 mg total) by mouth at bedtime. 90 tablet 3  . olmesartan (BENICAR) 40 MG tablet Take 1  tablet (40 mg total) by mouth daily. No additional refills without and office visit. (Patient taking differently: Take 40 mg by mouth daily. ) 90 tablet 0  . tamsulosin (FLOMAX) 0.4 MG CAPS capsule Take 0.4 mg by mouth daily.       Home: Home Living Family/patient expects to be discharged to:: Private residence Living Arrangements: Spouse/significant other Available Help at Discharge: Family Type of Home: House Additional Comments: no family present, pt cognitively altered, PT obtaining limited history from chart review of this admission  Functional History: Prior Function Level of Independence: Needs assistance Gait / Transfers Assistance Needed: pt ambulates with a RW, unsure of distance tolerated ADL's / Homemaking Assistance Needed: pt requires assistance for all ADLs Comments: history obtained from provider note this admission as no family present and pt with AMS Functional Status:  Mobility: Bed Mobility Overal bed mobility: Needs Assistance Bed Mobility: Supine to Sit Supine to sit: Min assist General bed mobility comments: minA to pull into sitting and to maintain safety at edge of bed Transfers Overall transfer level: Needs assistance Equipment used: 1 person hand held assist Transfers: Sit to/from Stand, Stand Pivot Transfers Sit to Stand: Min assist Stand pivot transfers: Mod assist General transfer comment: minA sit to stand with posterior lean against bed, modA SPT due to impaired balance      ADL:    Cognition: Cognition Overall Cognitive Status: No family/caregiver present to determine baseline cognitive functioning Orientation Level: Oriented to place, Disoriented to place, Disoriented to time, Disoriented to situation Cognition Arousal/Alertness: Awake/alert Behavior During Therapy: Restless, Impulsive Overall Cognitive Status: No family/caregiver present to determine baseline cognitive functioning General Comments: pt is oriented to self, but not to  person place or time. Pt with poor short term memory and safety awareness. Pt has some awareness of deficits but limited awareness of their implication on his safety.  Blood pressure 133/66, pulse 85, temperature 98.3 F (36.8 C), temperature source Oral, resp. rate 20, SpO2 98 %. Physical Exam  General: Was lying in bed with bilateral wrist restraints and mittens in place.  HEENT: Head is normocephalic, hard of hearing Heart: Rhythm regularly irregular.  No murmurs rubs or gallops Chest: CTA bilaterally without wheezes, rales, or rhonchi; no distress Abdomen: Soft, non-tender, non-distended, bowel sounds positive. Extremities: No clubbing, cyanosis, or edema. Pulses are 2+ Skin: Dried blood on right ear Neuro: Mild right facial weakness with dysarthric/anarthic speech. Initially confused but once awakened fully, he was oriented to self and place with minimal cues. He moves all four--had difficulty participating in MMT but did make effort.  Musculoskeletal: Full ROM, No pain with AROM or PROM in the neck, trunk, or extremities. Posture appropriate Psych: Pt's affect is appropriate. Pt is cooperative  Results for orders placed  or performed during the hospital encounter of 04/02/20 (from the past 24 hour(s))  CBC     Status: Abnormal   Collection Time: 04/03/20 11:08 AM  Result Value Ref Range   WBC 11.0 (H) 4.0 - 10.5 K/uL   RBC 4.00 (L) 4.22 - 5.81 MIL/uL   Hemoglobin 11.6 (L) 13.0 - 17.0 g/dL   HCT 36.7 (L) 39 - 52 %   MCV 91.8 80.0 - 100.0 fL   MCH 29.0 26.0 - 34.0 pg   MCHC 31.6 30.0 - 36.0 g/dL   RDW 14.6 11.5 - 15.5 %   Platelets 255 150 - 400 K/uL   nRBC 0.0 0.0 - 0.2 %   CT HEAD WO CONTRAST  Result Date: 04/04/2020 CLINICAL DATA:  Follow-up examination for intracranial hemorrhage. EXAM: CT HEAD WITHOUT CONTRAST TECHNIQUE: Contiguous axial images were obtained from the base of the skull through the vertex without intravenous contrast. COMPARISON:  Prior CT from 04/02/2020.  FINDINGS: Brain: Hemorrhage centered near the left caudate head is slightly decreased in size measuring 2.1 x 1.7 x 1.7 cm on today's exam. Mild surrounding vasogenic edema without significant regional mass effect. Intraventricular extension with blood layering in both lateral ventricles, slightly increased. Overall ventricular size is stable without hydrocephalus or trapping. Trace subarachnoid blood noted at the posterior left temporoparietal region, suspected be related to redistribution. No new hemorrhage. Underlying atrophy with extensive chronic ischemic changes again noted, stable. No acute large vessel territory infarct. No midline shift or extra-axial fluid collection. Vascular: No hyperdense vessel. Scattered vascular calcifications noted within the carotid siphons. Skull: Scalp soft tissues and calvarium unchanged. Sinuses/Orbits: Globes and orbital soft tissues demonstrate no acute finding. Small air-fluid level noted within the left sphenoid sinus. Paranasal sinuses are otherwise clear. No mastoid effusion. Other: None. IMPRESSION: 1. Slight interval decrease in size of left basal ganglia hemorrhage, now measuring 2.1 x 1.7 x 1.7 cm. Intraventricular extension with blood layering in both lateral ventricles, slightly increased. Overall ventricular size is stable without hydrocephalus or trapping. 2. Trace subarachnoid blood at the posterior left temporoparietal region, suspected to be related to redistribution. No new hemorrhage. 3. No other new acute intracranial abnormality. Electronically Signed   By: Jeannine Boga M.D.   On: 04/04/2020 02:57   CT HEAD WO CONTRAST  Result Date: 04/02/2020 CLINICAL DATA:  Acute neuro deficit.  Intracranial hemorrhage. EXAM: CT HEAD WITHOUT CONTRAST TECHNIQUE: Contiguous axial images were obtained from the base of the skull through the vertex without intravenous contrast. COMPARISON:  CT head 04/02/2020 FINDINGS: Brain: Hematoma in the left frontal horn is  unchanged. This may be arising from the left caudate. Correlate with hypertension. Mild intraventricular hemorrhage in the occipital horns bilaterally unchanged. No new area of hemorrhage. Generalized atrophy. Negative for hydrocephalus. Extensive white matter changes most likely due to chronic microvascular ischemia. Chronic infarct left cerebellum. Vascular: Negative for hyperdense vessel Skull: Negative Sinuses/Orbits: Paranasal sinuses clear.  Negative orbit Other: None IMPRESSION: Atrial ventricular hemorrhage is stable. No hydrocephalus. Suspect hemorrhage origin from the left basal ganglia/ left caudate region. Probable hypertensive hemorrhage Extensive chronic microvascular ischemic changes in the white matter. Electronically Signed   By: Franchot Gallo M.D.   On: 04/02/2020 18:04   CT HEAD WO CONTRAST  Result Date: 04/02/2020 CLINICAL DATA:  Altered mental status. EXAM: CT HEAD WITHOUT CONTRAST TECHNIQUE: Contiguous axial images were obtained from the base of the skull through the vertex without intravenous contrast. COMPARISON:  December 27, 2018. FINDINGS: Brain: Moderate chronic ischemic  white matter disease is noted. Mild diffuse cortical atrophy is noted. Large amount hemorrhage is noted in the left frontal horn as well as small amount of hemorrhage seen in both posterior horns. Potentially the hemorrhage may be originating in the left thalamus. No mass effect or midline shift is noted. No definite evidence of mass lesion or acute infarction is noted. Vascular: No hyperdense vessel or unexpected calcification. Skull: Normal. Negative for fracture or focal lesion. Sinuses/Orbits: No acute finding. Other: None. IMPRESSION: Intraventricular hemorrhage is noted bilaterally, but most prominently seen in the left lateral ventricle, and potentially may be originating from the left thalamus. No mass effect or midline shift is noted. Moderate chronic ischemic white matter disease is noted. Mild diffuse cortical  atrophy is noted. Critical Value/emergent results were called by telephone at the time of interpretation on 04/02/2020 at 10:41 am to provider Sherwood Gambler , who verbally acknowledged these results. Electronically Signed   By: Marijo Conception M.D.   On: 04/02/2020 10:41   DG CHEST PORT 1 VIEW  Result Date: 04/04/2020 CLINICAL DATA:  Pulmonary edema.  Wheezing. EXAM: PORTABLE CHEST 1 VIEW COMPARISON:  04/03/2020. FINDINGS: Prior CABG and cardiac valve replacement. Stable borderline cardiomegaly. Mild pulmonary venous congestion. Low lung volumes with mild left base subsegmental atelectasis. No pleural effusion. No pneumothorax. IMPRESSION: 1. Prior CABG and cardiac valve replacement. Stable borderline cardiomegaly. Mild pulmonary venous congestion. 2.  Low lung volumes.  Mild left base subsegment atelectasis. Electronically Signed   By: Marcello Moores  Register   On: 04/04/2020 07:27   DG CHEST PORT 1 VIEW  Result Date: 04/03/2020 CLINICAL DATA:  Pulmonary edema. EXAM: PORTABLE CHEST 1 VIEW COMPARISON:  04/02/2020. FINDINGS: Prior CABG and cardiac valve replacements. Cardiomegaly. Mild pulmonary venous congestion cannot be excluded. Low lung volumes with mild bibasilar atelectasis. Small left pleural effusion cannot be excluded. No pneumothorax. IMPRESSION: 1. Prior CABG and cardiac valve replacements. Cardiomegaly. Mild pulmonary venous congestion cannot be excluded. 2. Low lung volumes with mild bibasilar atelectasis. Small left pleural effusion cannot be excluded. Electronically Signed   By: Marcello Moores  Register   On: 04/03/2020 12:33   DG Chest Portable 1 View  Result Date: 04/02/2020 CLINICAL DATA:  Altered mental status EXAM: PORTABLE CHEST 1 VIEW COMPARISON:  02/23/2019 FINDINGS: Prior median sternotomy and valve replacement. Heart is normal size. No confluent opacities or effusions. No acute bony abnormality. IMPRESSION: No active disease. Electronically Signed   By: Rolm Baptise M.D.   On: 04/02/2020 10:37     ECHOCARDIOGRAM COMPLETE  Result Date: 04/02/2020    ECHOCARDIOGRAM REPORT   Patient Name:   DAM ASHRAF Date of Exam: 04/02/2020 Medical Rec #:  [id number]     Height:       61.0 in Accession #:    [id number]    Weight:       144.0 lb Date of Birth:  11-18-1933      BSA:          1.643 m Patient Age:    48 years      BP:           130/82 mmHg Patient Gender: M             HR:           91 bpm. Exam Location:  Inpatient Procedure: 2D Echo Indications:    Stroke 434.91 / I163.9  History:        Patient has prior history of Echocardiogram examinations, most  recent 07/17/2019. Prior CABG, Stroke, Arrythmias:Atrial                 Fibrillation; Risk Factors:Non-Smoker, Dyslipidemia and                 Hypertension. PFO, mitral repair with Maze procedure, tricuspid                 annuloplasty, MAZE and LAA oversewn by Dr. Roxy Manns 2007.                  Mitral Valve: prosthetic annuloplasty ring valve is present in                 the mitral position. Procedure Date: 2007.  Sonographer:    Leavy Cella Referring Phys: [id number] ASHISH ARORA IMPRESSIONS  1. Left ventricular ejection fraction, by estimation, is 65 to 70%. The left ventricle has normal function. The left ventricle has no regional wall motion abnormalities. There is mild left ventricular hypertrophy. Left ventricular diastolic function could not be evaluated.  2. Right ventricular systolic function was not well visualized. The right ventricular size is normal. There is normal pulmonary artery systolic pressure.  3. Left atrial size was moderately dilated.  4. Right atrial size was mild to moderately dilated.  5. The mitral valve has been repaired/replaced. Trivial mitral valve regurgitation. No evidence of mitral stenosis. There is a prosthetic annuloplasty ring present in the mitral position. Procedure Date: 2007.  6. Eccentric TR jet, best seen on image 22 and not well seen on other images. Jet is eccentric and likely moderate, but full jet  width not well visualized. Cannot determine if regurgitation is inside or outside TV ring.. The tricuspid valve is has been repaired/replaced. The tricuspid valve is status post repair with an annuloplasty ring. Tricuspid valve regurgitation is moderate.  7. The aortic valve is tricuspid. There is mild calcification of the aortic valve. There is mild thickening of the aortic valve. Aortic valve regurgitation is not visualized. Mild aortic valve sclerosis is present, with no evidence of aortic valve stenosis.  8. The inferior vena cava is normal in size with greater than 50% respiratory variability, suggesting right atrial pressure of 3 mmHg. Comparison(s): No significant change from prior study. Conclusion(s)/Recommendation(s): No intracardiac source of embolism detected on this transthoracic study. A transesophageal echocardiogram is recommended to exclude cardiac source of embolism if clinically indicated. FINDINGS  Left Ventricle: Left ventricular ejection fraction, by estimation, is 65 to 70%. The left ventricle has normal function. The left ventricle has no regional wall motion abnormalities. The left ventricular internal cavity size was normal in size. There is  mild left ventricular hypertrophy. Left ventricular diastolic function could not be evaluated due to mitral valve repair. Left ventricular diastolic function could not be evaluated. Right Ventricle: The right ventricular size is normal. Right vetricular wall thickness was not well visualized. Right ventricular systolic function was not well visualized. There is normal pulmonary artery systolic pressure. The tricuspid regurgitant velocity is 1.97 m/s, and with an assumed right atrial pressure of 3 mmHg, the estimated right ventricular systolic pressure is 16.0 mmHg. Left Atrium: Left atrial size was moderately dilated. Right Atrium: Right atrial size was mild to moderately dilated. Pericardium: Trivial pericardial effusion is present. Mitral Valve: The  mitral valve has been repaired/replaced. Trivial mitral valve regurgitation. There is a prosthetic annuloplasty ring present in the mitral position. Procedure Date: 2007. No evidence of mitral valve stenosis. Tricuspid Valve: Eccentric TR jet, best seen on  image 22 and not well seen on other images. Jet is eccentric and likely moderate, but full jet width not well visualized. Cannot determine if regurgitation is inside or outside TV ring. The tricuspid valve is has been repaired/replaced. Tricuspid valve regurgitation is moderate. The tricuspid valve is status post repair with an annuloplasty ring. Aortic Valve: The aortic valve is tricuspid. There is mild calcification of the aortic valve. There is mild thickening of the aortic valve. There is mild to moderate aortic valve annular calcification. Aortic valve regurgitation is not visualized. Mild aortic valve sclerosis is present, with no evidence of aortic valve stenosis. Pulmonic Valve: The pulmonic valve was not well visualized. Pulmonic valve regurgitation is mild. Aorta: The aortic root, ascending aorta and aortic arch are all structurally normal, with no evidence of dilitation or obstruction. Venous: The inferior vena cava is normal in size with greater than 50% respiratory variability, suggesting right atrial pressure of 3 mmHg. IAS/Shunts: The interatrial septum appears to be lipomatous. No atrial level shunt detected by color flow Doppler.  LEFT VENTRICLE PLAX 2D LVIDd:         3.25 cm  Diastology LVIDs:         2.10 cm  LV e' lateral:   8.49 cm/s LV PW:         1.55 cm  LV E/e' lateral: 15.1 LV IVS:        1.15 cm LVOT diam:     2.00 cm LVOT Area:     3.14 cm  LEFT ATRIUM             Index       RIGHT ATRIUM           Index LA diam:        4.40 cm 2.68 cm/m  RA Area:     16.90 cm LA Vol (A2C):   51.9 ml 31.60 ml/m RA Volume:   42.70 ml  25.99 ml/m LA Vol (A4C):   75.2 ml 45.78 ml/m LA Biplane Vol: 62.5 ml 38.05 ml/m   AORTA Ao Root diam: 2.90 cm  MITRAL VALVE                TRICUSPID VALVE MV Area (PHT): 3.42 cm     TR Peak grad:   15.5 mmHg MV Decel Time: 222 msec     TR Vmax:        197.00 cm/s MV E velocity: 128.00 cm/s                             SHUNTS                             Systemic Diam: 2.00 cm Buford Dresser MD Electronically signed by Buford Dresser MD Signature Date/Time: 04/02/2020/2:40:11 PM    Final      Assessment/Plan: Diagnosis: ICH 1. Does the need for close, 24 hr/day medical supervision in concert with the patient's rehab needs make it unreasonable for this patient to be served in a less intensive setting? Yes 2. Co-Morbidities requiring supervision/potential complications: PVCs, cyst and pseudocyst of pancreas, history of renal cyst, afib, s/p mitral valve repair, HTN, left upper extremity paresthesia 3. Due to bladder management, bowel management, safety, skin/wound care, disease management, medication administration, pain management and patient education, does the patient require 24 hr/day rehab nursing? Yes 4. Does the patient require coordinated care of a  physician, rehab nurse, therapy disciplines of PT, OT, SLP to address physical and functional deficits in the context of the above medical diagnosis(es)? Yes Addressing deficits in the following areas: balance, endurance, locomotion, strength, transferring, bowel/bladder control, bathing, dressing, feeding, grooming, toileting, cognition, speech, language and psychosocial support 5. Can the patient actively participate in an intensive therapy program of at least 3 hrs of therapy per day at least 5 days per week? Yes 6. The potential for patient to make measurable gains while on inpatient rehab is excellent 7. Anticipated functional outcomes upon discharge from inpatient rehab are min assist  with PT, min assist with OT, min assist with SLP. 8. Estimated rehab length of stay to reach the above functional goals is: 2-3 weeks 9. Anticipated discharge  destination: Home 10. Overall Rehab/Functional Prognosis: excellent and good  RECOMMENDATIONS: This patient's condition is appropriate for continued rehabilitative care in the following setting: CIR Patient has agreed to participate in recommended program. Yes and N/A Note that insurance prior authorization may be required for reimbursement for recommended care.  Comment: Mr. Montano would be a good CIR candidate if 24/7 support can be confirmed. Thank you for this consult. Admission coordinator to follow.   I have personally performed a face to face diagnostic evaluation, including, but not limited to relevant history and physical exam findings, of this patient and developed relevant assessment and plan.  Additionally, I have reviewed and concur with the physician assistant's documentation above.  Leeroy Cha, MD  Bary Leriche, PA-C 04/04/2020

## 2020-04-03 NOTE — Progress Notes (Addendum)
STROKE TEAM PROGRESS NOTE   INTERVAL HISTORY His wife and RNs are at the bedside.  Pt reclining in bed, wheezing, with increased WOB. However, still able to speak simple sentence in one breath. Orientated to self and people, but not age, place or time. Mild left sided weakness. Per report, he had fever on ED arrival and urine hazy, thought to have urosepsis. Was given LR @ 150cc/h and on rocephin. However, no more fever in ER and ICU, UA no WBC. Will d/c IVF and treat with lasix. Still on cleviprex will taper off as able.   Vitals:   04/03/20 0500 04/03/20 0600 04/03/20 0700 04/03/20 0800  BP: 112/69 (!) 133/59 122/74 (!) 115/59  Pulse: 77 70 74 71  Resp: (!) 22 19 (!) 23 20  Temp:    98.1 F (36.7 C)  TempSrc:    Oral  SpO2: 93% 96% 96% 95%   CBC:  Recent Labs  Lab 04/02/20 0929  WBC 12.2*  NEUTROABS 7.9*  HGB 13.1  HCT 41.6  MCV 91.6  PLT 867   Basic Metabolic Panel:  Recent Labs  Lab 04/02/20 0929  NA 140  K 3.3*  CL 104  CO2 24  GLUCOSE 139*  BUN 14  CREATININE 0.92  CALCIUM 8.8*   Lipid Panel:  Recent Labs  Lab 04/02/20 1300  CHOL 158  TRIG 116  HDL 32*  CHOLHDL 4.9  VLDL 23  LDLCALC 103*   HgbA1c:  Recent Labs  Lab 04/02/20 1300  HGBA1C 5.7*   Urine Drug Screen: No results for input(s): LABOPIA, COCAINSCRNUR, LABBENZ, AMPHETMU, THCU, LABBARB in the last 168 hours.  Alcohol Level No results for input(s): ETH in the last 168 hours.  IMAGING past 24 hours CT HEAD WO CONTRAST  Result Date: 04/02/2020 CLINICAL DATA:  Acute neuro deficit.  Intracranial hemorrhage. EXAM: CT HEAD WITHOUT CONTRAST TECHNIQUE: Contiguous axial images were obtained from the base of the skull through the vertex without intravenous contrast. COMPARISON:  CT head 04/02/2020 FINDINGS: Brain: Hematoma in the left frontal horn is unchanged. This may be arising from the left caudate. Correlate with hypertension. Mild intraventricular hemorrhage in the occipital horns bilaterally  unchanged. No new area of hemorrhage. Generalized atrophy. Negative for hydrocephalus. Extensive white matter changes most likely due to chronic microvascular ischemia. Chronic infarct left cerebellum. Vascular: Negative for hyperdense vessel Skull: Negative Sinuses/Orbits: Paranasal sinuses clear.  Negative orbit Other: None IMPRESSION: Atrial ventricular hemorrhage is stable. No hydrocephalus. Suspect hemorrhage origin from the left basal ganglia/ left caudate region. Probable hypertensive hemorrhage Extensive chronic microvascular ischemic changes in the white matter. Electronically Signed   By: Franchot Gallo M.D.   On: 04/02/2020 18:04   CT HEAD WO CONTRAST  Result Date: 04/02/2020 CLINICAL DATA:  Altered mental status. EXAM: CT HEAD WITHOUT CONTRAST TECHNIQUE: Contiguous axial images were obtained from the base of the skull through the vertex without intravenous contrast. COMPARISON:  December 27, 2018. FINDINGS: Brain: Moderate chronic ischemic white matter disease is noted. Mild diffuse cortical atrophy is noted. Large amount hemorrhage is noted in the left frontal horn as well as small amount of hemorrhage seen in both posterior horns. Potentially the hemorrhage may be originating in the left thalamus. No mass effect or midline shift is noted. No definite evidence of mass lesion or acute infarction is noted. Vascular: No hyperdense vessel or unexpected calcification. Skull: Normal. Negative for fracture or focal lesion. Sinuses/Orbits: No acute finding. Other: None. IMPRESSION: Intraventricular hemorrhage is noted bilaterally, but  most prominently seen in the left lateral ventricle, and potentially may be originating from the left thalamus. No mass effect or midline shift is noted. Moderate chronic ischemic white matter disease is noted. Mild diffuse cortical atrophy is noted. Critical Value/emergent results were called by telephone at the time of interpretation on 04/02/2020 at 10:41 am to provider Sherwood Gambler , who verbally acknowledged these results. Electronically Signed   By: Marijo Conception M.D.   On: 04/02/2020 10:41   DG Chest Portable 1 View  Result Date: 04/02/2020 CLINICAL DATA:  Altered mental status EXAM: PORTABLE CHEST 1 VIEW COMPARISON:  02/23/2019 FINDINGS: Prior median sternotomy and valve replacement. Heart is normal size. No confluent opacities or effusions. No acute bony abnormality. IMPRESSION: No active disease. Electronically Signed   By: Rolm Baptise M.D.   On: 04/02/2020 10:37   ECHOCARDIOGRAM COMPLETE  Result Date: 04/02/2020    ECHOCARDIOGRAM REPORT   Patient Name:   Jack [name] Date of Exam: 04/02/2020 Medical Rec #:  [id number]     Height:       61.0 in Accession #:    [id number]    Weight:       144.0 lb Date of Birth:  May 22, 1934      BSA:          1.643 m Patient Age:    86 years      BP:           130/82 mmHg Patient Gender: M             HR:           91 bpm. Exam Location:  Inpatient Procedure: 2D Echo Indications:    Stroke 434.91 / I163.9  History:        Patient has prior history of Echocardiogram examinations, most                 recent 07/17/2019. Prior CABG, Stroke, Arrythmias:Atrial                 Fibrillation; Risk Factors:Non-Smoker, Dyslipidemia and                 Hypertension. PFO, mitral repair with Maze procedure, tricuspid                 annuloplasty, MAZE and LAA oversewn by Dr. Roxy Manns 2007.                  Mitral Valve: prosthetic annuloplasty ring valve is present in                 the mitral position. Procedure Date: 2007.  Sonographer:    Leavy Cella Referring Phys: [id number] ASHISH ARORA IMPRESSIONS  1. Left ventricular ejection fraction, by estimation, is 65 to 70%. The left ventricle has normal function. The left ventricle has no regional wall motion abnormalities. There is mild left ventricular hypertrophy. Left ventricular diastolic function could not be evaluated.  2. Right ventricular systolic function was not well visualized. The right  ventricular size is normal. There is normal pulmonary artery systolic pressure.  3. Left atrial size was moderately dilated.  4. Right atrial size was mild to moderately dilated.  5. The mitral valve has been repaired/replaced. Trivial mitral valve regurgitation. No evidence of mitral stenosis. There is a prosthetic annuloplasty ring present in the mitral position. Procedure Date: 2007.  6. Eccentric TR jet, best seen on image 22 and not well seen on other images. Jet is  eccentric and likely moderate, but full jet width not well visualized. Cannot determine if regurgitation is inside or outside TV ring.. The tricuspid valve is has been repaired/replaced. The tricuspid valve is status post repair with an annuloplasty ring. Tricuspid valve regurgitation is moderate.  7. The aortic valve is tricuspid. There is mild calcification of the aortic valve. There is mild thickening of the aortic valve. Aortic valve regurgitation is not visualized. Mild aortic valve sclerosis is present, with no evidence of aortic valve stenosis.  8. The inferior vena cava is normal in size with greater than 50% respiratory variability, suggesting right atrial pressure of 3 mmHg. Comparison(s): No significant change from prior study. Conclusion(s)/Recommendation(s): No intracardiac source of embolism detected on this transthoracic study. A transesophageal echocardiogram is recommended to exclude cardiac source of embolism if clinically indicated. FINDINGS  Left Ventricle: Left ventricular ejection fraction, by estimation, is 65 to 70%. The left ventricle has normal function. The left ventricle has no regional wall motion abnormalities. The left ventricular internal cavity size was normal in size. There is  mild left ventricular hypertrophy. Left ventricular diastolic function could not be evaluated due to mitral valve repair. Left ventricular diastolic function could not be evaluated. Right Ventricle: The right ventricular size is normal. Right  vetricular wall thickness was not well visualized. Right ventricular systolic function was not well visualized. There is normal pulmonary artery systolic pressure. The tricuspid regurgitant velocity is 1.97 m/s, and with an assumed right atrial pressure of 3 mmHg, the estimated right ventricular systolic pressure is 54.5 mmHg. Left Atrium: Left atrial size was moderately dilated. Right Atrium: Right atrial size was mild to moderately dilated. Pericardium: Trivial pericardial effusion is present. Mitral Valve: The mitral valve has been repaired/replaced. Trivial mitral valve regurgitation. There is a prosthetic annuloplasty ring present in the mitral position. Procedure Date: 2007. No evidence of mitral valve stenosis. Tricuspid Valve: Eccentric TR jet, best seen on image 22 and not well seen on other images. Jet is eccentric and likely moderate, but full jet width not well visualized. Cannot determine if regurgitation is inside or outside TV ring. The tricuspid valve is has been repaired/replaced. Tricuspid valve regurgitation is moderate. The tricuspid valve is status post repair with an annuloplasty ring. Aortic Valve: The aortic valve is tricuspid. There is mild calcification of the aortic valve. There is mild thickening of the aortic valve. There is mild to moderate aortic valve annular calcification. Aortic valve regurgitation is not visualized. Mild aortic valve sclerosis is present, with no evidence of aortic valve stenosis. Pulmonic Valve: The pulmonic valve was not well visualized. Pulmonic valve regurgitation is mild. Aorta: The aortic root, ascending aorta and aortic arch are all structurally normal, with no evidence of dilitation or obstruction. Venous: The inferior vena cava is normal in size with greater than 50% respiratory variability, suggesting right atrial pressure of 3 mmHg. IAS/Shunts: The interatrial septum appears to be lipomatous. No atrial level shunt detected by color flow Doppler.  LEFT  VENTRICLE PLAX 2D LVIDd:         3.25 cm  Diastology LVIDs:         2.10 cm  LV e' lateral:   8.49 cm/s LV PW:         1.55 cm  LV E/e' lateral: 15.1 LV IVS:        1.15 cm LVOT diam:     2.00 cm LVOT Area:     3.14 cm  LEFT ATRIUM  Index       RIGHT ATRIUM           Index LA diam:        4.40 cm 2.68 cm/m  RA Area:     16.90 cm LA Vol (A2C):   51.9 ml 31.60 ml/m RA Volume:   42.70 ml  25.99 ml/m LA Vol (A4C):   75.2 ml 45.78 ml/m LA Biplane Vol: 62.5 ml 38.05 ml/m   AORTA Ao Root diam: 2.90 cm MITRAL VALVE                TRICUSPID VALVE MV Area (PHT): 3.42 cm     TR Peak grad:   15.5 mmHg MV Decel Time: 222 msec     TR Vmax:        197.00 cm/s MV E velocity: 128.00 cm/s                             SHUNTS                             Systemic Diam: 2.00 cm Buford Dresser MD Electronically signed by Buford Dresser MD Signature Date/Time: 04/02/2020/2:40:11 PM    Final     PHYSICAL EXAM    Temp:  [97.5 F (36.4 C)-98.8 F (37.1 C)] 98.1 F (36.7 C) (09/23 0800) Pulse Rate:  [58-144] 79 (09/23 1000) Resp:  [16-24] 21 (09/23 1000) BP: (103-160)/(59-130) 130/68 (09/23 1000) SpO2:  [92 %-100 %] 95 % (09/23 1000)  General - Well nourished, well developed, wheezing with increased WOB.  Ophthalmologic - fundi not visualized due to noncooperation.  Cardiovascular - irregularly irregular heart rate and rhythm.  Neuro - awake alert, wheezing with increased WOB and abdominal breathing. Orientated to self and wife, but not to time, age or place. Spontaneous speech but paucity of speech, moderate dysarthria, following simple commands with repetitive prompt, able to name but not cooperative with repeat. No gaze deviation, able to track bilaterally, blinking to visual threat bilaterally. Slight right nasolabial fold flattening, however not cooperative with facial movement. Tongue midline. LUE pronator drift, RUE no drift. RLE 3/5 proximal and 5/5 distal DF/PF. LLE 2/5 proximal, 3/5  distal DF/PF. Sensation symmetrical per pt, b/l FTN grossly intact. Gait not tested.   ASSESSMENT/PLAN Mr. AUDRICK LAMOUREAUX is a 86 y.o. 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 Admitted to the neuro ICU.  Stroke: L caudate head ICH w/ IVH likely due to small vessel disease with HTN  CT head 1041 L>R IVH. Small vessel disease. Atrophy.   CT head 1804 IVH stable. Suspect L caudate origin. Extensive small vessel disease.   CT head repeat pending  MRI 12/30/2019 no CAA  MRI 06/2016 no AVM or aneurysm  2D Echo EF 65-70%. No source of embolus. LA mildly dilated.   LDL 103  HgbA1c 5.7  VTE prophylaxis - SCDs  aspirin 81 mg daily prior to admission, now on No antithrombotic given hemorrhage  Therapy recommendations:  pending   Disposition:  pending   Acute pulmonary edema  Likely fluid overload due to LR @ 150cc for > 24 h  Stat CXR 9/23 - low  volumes, mild basilar atx.   D/c IVF  Lasix 40mg  IV x 2  CXR repeat in am  Leukocytosis   Fever reported in ER, none since  WBC 12.2->11.0  UA neg   Rocephin 9/22>>  Hx stroke/TIA  2017 L MCA infarct felt to be embolic secondary to AF not on AC. not a long-term AC candidate d/t ICH. Put on ASA  2015 - thalamic ICH d/t HTN, enrolled in ATACH2  2009 - R MCA territory infarcts  Atrial Fibrillation  Home anticoagulation:  none d/t hx ICH   ASA 81 PTA  Now on no antithrombotics due to recurrent ICH   Hypertension  Home meds:  benicar 40  Now off Cleviprex . SBP < 160 now . Long-term BP goal normotensive  Hyperlipidemia  Home meds:  No statin  LDL 103, goal < 70 for ischemic stroke  Hold statin in setting of acute hemorrhage    Dysphagia . Secondary to stroke . NPO except meds . MBS canceled d/t poor cooperation following long wait in radiology . Speech on board   Other Stroke Risk Factors  Advanced age  Known PFO  Other Active Problems  Vascular dementia  BPH on flomax  Hypokalemia K 3.3 - supplement  Hospital day # 1  This patient is critically ill due to left ICH with IVH, history of ICH, A. fib not on AC, pulmonary edema, dysphagia and at significant risk of neurological worsening, death form heart failure, cardiac arrest, aspiration pneumonia, sepsis, hematoma expansion, hydrocephalus, cerebral edema, brain herniation. This patient's care requires constant monitoring of vital signs, hemodynamics, respiratory and cardiac monitoring, review of multiple databases, neurological assessment, discussion with family, other specialists and medical decision making of high complexity. I spent 40 minutes of neurocritical care time in the care of this patient. I had long discussion with wife at bedside, updated pt current condition, treatment plan and potential prognosis, and answered all the questions.  She expressed understanding and appreciation.    Rosalin Hawking, MD PhD Stroke Neurology 04/03/2020 6:03 PM   To contact Stroke Continuity provider, please refer to [email or web address] After hours, contact General Neurology

## 2020-04-03 NOTE — Progress Notes (Signed)
Spoke with on call Neuro MD Lindzen regarding patient's systolic bp.  MD restarted patient's home med, amlodipine, and gave orders to restart cleviprex gtt if sys became >160.  Will continue to monitor and act accordingly.

## 2020-04-03 NOTE — Progress Notes (Signed)
  Speech Language Pathology Treatment: Dysphagia  Patient Details Name: Jack [name] MRN: [id number] DOB: 1933/08/23 Today's Date: 04/03/2020 Time: 0802-2336 SLP Time Calculation (min) (ACUTE ONLY): 11 min  Assessment / Plan / Recommendation Clinical Impression  Pt's mentation is improved compared to previous date, but he also has baseline expiratory wheeze that was not noted during evaluation yesterday. Pt is better able to engage in self-feeding today but will not take more than a single, small sip at a time despite cues to try to challenge him further. Outwardly he still appears to have impaired coordination and there is intermittent coughing observed. Discussed with RN - recommend proceeding with MBS prior to initiating PO diet. She plans to address respiratory status with MD to determine his readiness to leave the floor for this. Will schedule as able, but until then would remain NPO except for meds crushed in puree.   HPI HPI: Pt is an 86 yo male presenting with AMS. CT Head showed IVH bilaterally but most prominently seen in the L lateral ventricle and potentially originating from the L thalamus. PMH includes: hearing loss, multiple CVAs, schatzki's ring, HTN, HLD, HH, and dementia (MMSE 15/30 in 11/2019). Pt was seen for swallowing in 2015 s/p CVA, initially started on nectar thick liquids but advanced to thin liquids after MBS.      SLP Plan  MBS       Recommendations  Diet recommendations: NPO Medication Administration: Crushed with puree                Oral Care Recommendations: Oral care QID Follow up Recommendations:  (tba) SLP Visit Diagnosis: Dysphagia, unspecified (R13.10) Plan: MBS       GO                Osie Bond., M.A. [facility] Pager [phone] Office [phone]  04/03/2020, 11:30 AM

## 2020-04-03 NOTE — Evaluation (Signed)
Physical Therapy Evaluation Patient Details Name: Jack [name] MRN: [id number] DOB: [birth year] Today's Date: 04/03/2020   History of Present Illness  86 year old man with a prior history of atrial fibrillation not on anticoagulation due to [place], prior ischemic stroke in the left MCA territory December 2017, prior ICH-right thalamic/capsular -with residual left-sided weakness since 2015, vascular dementia with last MMSE 15/30 in May 2021 requiring a walker to walk at baseline and requires 24-hour assistance with ADLs, presented to the emergency room for evaluation of worsening confusion.  Clinical Impression  Pt presents to PT with deficits in cognition, functional mobility, balance, gait, endurance, strength, power, sensation, coordination. Pt has very poor awareness of his deficits and no safety awareness at this time. Pt's poor cognition, weakness, and posterior lean preference all place the patient at a very high falls risk at this time. Pt will benefit form continued acute PT POC to improve mobility quality and to reduce falls risk. PT recommends CIR at this time however the pt's prior level of function will need to be more accurately determined by discussing with family to see if this is the most appropriate post-acute setting.    Follow Up Recommendations CIR    Equipment Recommendations   (TBD, pt likely owns RW will need to confirm with family)    Recommendations for Other Services Rehab consult     Precautions / Restrictions Precautions Precautions: Fall Precaution Comments: monitor SpO2 Restrictions Weight Bearing Restrictions: No      Mobility  Bed Mobility Overal bed mobility: Needs Assistance Bed Mobility: Supine to Sit     Supine to sit: Min assist     General bed mobility comments: minA to pull into sitting and to maintain safety at edge of bed  Transfers Overall transfer level: Needs assistance Equipment used: 1 person hand held assist Transfers: Sit to/from  [facility] Sit to Stand: Min assist Stand pivot transfers: Mod assist       General transfer comment: minA sit to stand with posterior lean against bed, modA SPT due to impaired balance  Ambulation/Gait                Stairs            Wheelchair Mobility    Modified Rankin (Stroke Patients Only) Modified Rankin (Stroke Patients Only) Pre-Morbid Rankin Score: Moderately severe disability Modified Rankin: Moderately severe disability     Balance Overall balance assessment: Needs assistance Sitting-balance support: Single extremity supported;Feet supported Sitting balance-Leahy Scale: Poor Sitting balance - Comments: minA due to posterior lean Postural control: Posterior lean Standing balance support: Single extremity supported Standing balance-Leahy Scale: Poor Standing balance comment: min-modA due to posterior lean                             Pertinent Vitals/Pain Pain Assessment: Faces Faces Pain Scale: No hurt    Home Living Family/patient expects to be discharged to:: Private residence Living Arrangements: Spouse/significant other Available Help at Discharge: Family Type of Home: House           Additional Comments: no family present, pt cognitively altered, PT obtaining limited history from chart review of this admission    Prior Function Level of Independence: Needs assistance   Gait / Transfers Assistance Needed: pt ambulates with a RW, unsure of distance tolerated  ADL's / Homemaking Assistance Needed: pt requires assistance for all ADLs  Comments: history obtained from provider note this admission as  no family present and pt with AMS     Hand Dominance        Extremity/Trunk Assessment   Upper Extremity Assessment Upper Extremity Assessment: RUE deficits/detail;LUE deficits/detail RUE Deficits / Details: generalized weakness, R shoulder flexion limited to ~70 degrees AROM, AAROM WFL, elbow/wrist/digit  AROM WFL RUE Sensation: decreased light touch LUE Deficits / Details: L shoulder AROM limited to ~60 degrees flexion, AAROM WFL, elbow/wrist/digit AROM WFL, generally weak. LUE Sensation: decreased light touch LUE Coordination: decreased gross motor;decreased fine motor    Lower Extremity Assessment Lower Extremity Assessment: Generalized weakness    Cervical / Trunk Assessment Cervical / Trunk Assessment: Kyphotic  Communication   Communication: HOH  Cognition Arousal/Alertness: Awake/alert Behavior During Therapy: Restless;Impulsive Overall Cognitive Status: No family/caregiver present to determine baseline cognitive functioning                                 General Comments: pt is oriented to self, but not to person place or time. Pt with poor short term memory and safety awareness. Pt has some awareness of deficits but limited awareness of their implication on his safety.      General Comments General comments (skin integrity, edema, etc.): pt on RA upon arrival, attempting to get to edge of bed, RN and nurse tech present. Pt with audible wheezing sounds with mobility and sats ranging from 88-92% on RA. Pt with very poor memory and poor recall of safety precautions    Exercises     Assessment/Plan    PT Assessment Patient needs continued PT services  PT Problem List Decreased strength;Decreased activity tolerance;Decreased range of motion;Decreased balance;Decreased mobility;Decreased cognition;Decreased knowledge of use of DME;Decreased coordination;Decreased safety awareness;Decreased knowledge of precautions;Cardiopulmonary status limiting activity;Impaired sensation       PT Treatment Interventions DME instruction;Gait training;Stair training;Functional mobility training;Therapeutic activities;Therapeutic exercise;Balance training;Neuromuscular re-education;Cognitive remediation;Patient/family education    PT Goals (Current goals can be found in the Care  Plan section)  Acute Rehab PT Goals Patient Stated Goal: to get out of bed on his own PT Goal Formulation: With patient Time For Goal Achievement: 04/17/20 Potential to Achieve Goals: Good Additional Goals Additional Goal #1: Pt will maintain dynamic standing balance with unilateral UE support of the LRAD and minG    Frequency Min 4X/week   Barriers to discharge        Co-evaluation               AM-PAC PT "6 Clicks" Mobility  Outcome Measure Help needed turning from your back to your side while in a flat bed without using bedrails?: A Little Help needed moving from lying on your back to sitting on the side of a flat bed without using bedrails?: A Little Help needed moving to and from a bed to a chair (including a wheelchair)?: A Lot Help needed standing up from a chair using your arms (e.g., wheelchair or bedside chair)?: A Little Help needed to walk in hospital room?: A Lot Help needed climbing 3-5 steps with a railing? : Total 6 Click Score: 14    End of Session   Activity Tolerance: Patient tolerated treatment well Patient left: in chair;with call bell/phone within reach;with chair alarm set Nurse Communication: Mobility status PT Visit Diagnosis: Unsteadiness on feet (R26.81);Other abnormalities of gait and mobility (R26.89);Muscle weakness (generalized) (M62.81);Other symptoms and signs involving the nervous system (X41.287)    Time: 8676-7209 PT Time Calculation (min) (ACUTE ONLY):  20 min   Charges:   PT Evaluation $PT Eval Moderate Complexity: 1 Mod          Zenaida Niece, PT, DPT Acute Rehabilitation Pager: [phone]   Zenaida Niece 04/03/2020, 3:30 PM

## 2020-04-03 NOTE — Progress Notes (Signed)
OT Cancellation Note  Patient Details Name: Jack [name] MRN: [id number] DOB: 10-Jun-1934   Cancelled Treatment:    Reason Eval/Treat Not Completed: Active bedrest order  [place], OT/L   Acute OT Clinical Specialist Acute Rehabilitation Services Pager [phone] Office [phone]  04/03/2020, 6:34 AM

## 2020-04-04 ENCOUNTER — Inpatient Hospital Stay (HOSPITAL_COMMUNITY): Payer: Medicare Other

## 2020-04-04 DIAGNOSIS — I161 Hypertensive emergency: Secondary | ICD-10-CM

## 2020-04-04 DIAGNOSIS — I610 Nontraumatic intracerebral hemorrhage in hemisphere, subcortical: Secondary | ICD-10-CM

## 2020-04-04 DIAGNOSIS — I4811 Longstanding persistent atrial fibrillation: Secondary | ICD-10-CM

## 2020-04-04 LAB — CBC
HCT: 39.9 % (ref 39.0–52.0)
Hemoglobin: 13.3 g/dL (ref 13.0–17.0)
MCH: 29.9 pg (ref 26.0–34.0)
MCHC: 33.3 g/dL (ref 30.0–36.0)
MCV: 89.7 fL (ref 80.0–100.0)
Platelets: 308 10*3/uL (ref 150–400)
RBC: 4.45 MIL/uL (ref 4.22–5.81)
RDW: 14.5 % (ref 11.5–15.5)
WBC: 9.2 10*3/uL (ref 4.0–10.5)
nRBC: 0 % (ref 0.0–0.2)

## 2020-04-04 LAB — BASIC METABOLIC PANEL
Anion gap: 11 (ref 5–15)
BUN: 13 mg/dL (ref 8–23)
CO2: 27 mmol/L (ref 22–32)
Calcium: 8.7 mg/dL — ABNORMAL LOW (ref 8.9–10.3)
Chloride: 101 mmol/L (ref 98–111)
Creatinine, Ser: 0.9 mg/dL (ref 0.61–1.24)
GFR calc Af Amer: >60 mL/min (ref >60)
GFR calc non Af Amer: >60 mL/min (ref >60)
Glucose, Bld: 120 mg/dL — ABNORMAL HIGH (ref 70–99)
Potassium: 3.2 mmol/L — ABNORMAL LOW (ref 3.5–5.1)
Sodium: 139 mmol/L (ref 135–145)

## 2020-04-04 MED ORDER — IRBESARTAN 150 MG PO TABS
150.0000 mg | ORAL_TABLET | Freq: Every day | ORAL | Status: DC
  Administered 2020-04-04 – 2020-04-08 (×5): 150 mg via ORAL
  Filled 2020-04-04 (×5): qty 1

## 2020-04-04 MED ORDER — LABETALOL HCL 5 MG/ML IV SOLN: 5.0000 mg | INTRAVENOUS | Status: DC | PRN

## 2020-04-04 MED ORDER — DONEPEZIL HCL 10 MG PO TABS
10.0000 mg | ORAL_TABLET | Freq: Every day | ORAL | Status: DC
  Administered 2020-04-04 – 2020-04-07 (×4): 10 mg via ORAL
  Filled 2020-04-04 (×4): qty 1

## 2020-04-04 MED ORDER — TAMSULOSIN HCL 0.4 MG PO CAPS
0.4000 mg | ORAL_CAPSULE | Freq: Every day | ORAL | Status: DC
  Administered 2020-04-04 – 2020-04-08 (×5): 0.4 mg via ORAL
  Filled 2020-04-04 (×5): qty 1

## 2020-04-04 MED ORDER — HEPARIN SODIUM (PORCINE) 5000 UNIT/ML IJ SOLN
5000.0000 [IU] | Freq: Three times a day (TID) | INTRAMUSCULAR | Status: DC
  Administered 2020-04-04 – 2020-04-08 (×13): 5000 [IU] via SUBCUTANEOUS
  Filled 2020-04-04 (×13): qty 1

## 2020-04-04 MED ORDER — POTASSIUM CHLORIDE CRYS ER 20 MEQ PO TBCR
40.0000 meq | EXTENDED_RELEASE_TABLET | ORAL | Status: AC
  Administered 2020-04-04: 40 meq via ORAL
  Filled 2020-04-04 (×2): qty 2

## 2020-04-04 NOTE — Progress Notes (Signed)
Modified Barium Swallow Progress Note  Patient Details  Name: Jack [name] MRN: [id number] Date of Birth: Oct 10, 1933  Today's Date: 04/04/2020  Modified Barium Swallow completed.  Full report located under Chart Review in the Imaging Section.  Brief recommendations include the following:  Clinical Impression    Pt has a mild oropharyngeal dysphagia with suspected esophageal deficits, but with pharyngeal phase grossly functional. He has lingual pumping and delayed oral transit, leaving mild amounts of residue on the tongue despite consistency. He does not really masticate the graham cracker despite cues. Instead, he mashes it against the roof of his mouth with his tongue until it starts to break down. Swallow initiation is triggered at the pyriform sinuses with thin liquids, but more so at the vllaeculae with other consistencies. This leads to penetration that is trace and transient with thin liquids, with liquids approaching the vocal folds but consistently clearing. Pt today is taking bigger, more consecutive volumes and he still does not aspirate. Nectar thick liquids were also assessed and resulted in only a single episode of transient penetration that is more shallow within the laryngeal vestibule. Pt needed additional time and liquid washes to try to clear the barium tablet that appeared to remain in his distal esophagus. Subsequent liquid trials (which happened to be nectar thick) appeared to stay in his esophagus, with backward flow within the esophagus and even as far as into his pharynx, completely filling his pyriform sinuses. With use of a dry spoon, pt is cued to perform a dry swallow that helps clear both his pharynx and esophagus. Recommend starting with Dys 1 diet and thin liquids, crushing pills in puree. Will f/u clinically for potential to gradually advance solids back up.   Swallow Evaluation Recommendations       SLP Diet Recommendations: Dysphagia 1 (Puree) solids;Thin  liquid   Liquid Administration via: Cup;Straw   Medication Administration: Crushed with puree   Supervision: Staff to assist with self feeding   Compensations: Slow rate;Small sips/bites;Follow solids with liquid   Postural Changes: Remain semi-upright after after feeds/meals (Comment);Seated upright at 90 degrees   Oral Care Recommendations: Oral care BID        Osie Bond., M.A. [place] Acute Rehabilitation Services Pager [phone] Office [phone]  04/04/2020,10:23 AM

## 2020-04-04 NOTE — Evaluation (Signed)
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 Pt will benefit from acute OT with follow up on CIR to work back towards PLOF.     Follow Up Recommendations  CIR;Supervision/Assistance - 24 hour    Equipment Recommendations  Other (comment) (TBD next venue)       Precautions / Restrictions Precautions Precautions: Fall Precaution Comments: monitor SpO2 Restrictions Weight Bearing Restrictions: No      Mobility Bed Mobility Overal bed mobility: Needs Assistance Bed Mobility: Sit to Supine      Sit to supine: Max assist   General bed mobility comments: A for trunk and legs, VCs for sequencing  Transfers Overall transfer level: Needs assistance Equipment used: 1 person hand held assist Transfers: Sit to/from [facility] Sit to Stand: Mod assist Stand pivot transfers: Max assist       General transfer comment: Mod A with me standing in front of him and use of gait belt. Max A stand pivot with me holding gait belt, A'ing him to weight shift and him holding my arms    Balance Overall balance assessment: Needs assistance Sitting-balance support: Bilateral upper  extremity supported;Feet supported Sitting balance-Leahy Scale: Poor Sitting balance - Comments: minA due to posterior lean Postural control: Posterior lean Standing balance support: Bilateral upper extremity supported Standing balance-Leahy Scale: Poor Standing balance comment: minG, reliant on BUE support of RW                           ADL either performed or assessed with clinical judgement   ADL Overall ADL's : Needs assistance/impaired Eating/Feeding: Total assistance;Bed level   Grooming: Wash/dry face;Set up;Supervision/safety;Bed level Grooming Details (indicate cue type and reason): HOB up Upper Body Bathing: Maximal assistance;Bed level   Lower Body Bathing: Total assistance;Bed level Lower Body Bathing Details (indicate cue type and reason): Mod A sit<>stand Upper Body Dressing : Total assistance;Bed level   Lower Body Dressing: Total assistance;Bed level Lower Body Dressing Details (indicate cue type and reason): Mod A sit<>stand Toilet Transfer: Maximal assistance;Stand-pivot Toilet Transfer Details (indicate cue type and reason): recliner>bed on his right; took 4 attempts of sit<>stand to get him to turn his feet to get to bed and A for weight shifts Toileting- Clothing Manipulation and Hygiene: Total assistance Toileting - Clothing Manipulation Details (indicate cue type and reason): Mod A sit<>stand             Vision Baseline Vision/History: Wears glasses Wears Glasses: At all times Patient Visual Report: No change from baseline Additional Comments: To be further tested in functional context            Pertinent Vitals/Pain Pain Assessment: Faces Faces Pain Scale: No hurt  Hand Dominance Left   Extremity/Trunk Assessment Upper Extremity Assessment Upper Extremity Assessment: Generalized weakness RUE Deficits / Details: lifted both arms alternately with cues  when I was putting gait belt on him LUE Deficits / Details: lifted both  arms alternately with cues  when I was putting gait belt on him           Communication Communication Communication: HOH   Cognition Arousal/Alertness: Lethargic (woke up more as I had him moving around) Behavior During Therapy: Flat affect Overall Cognitive Status: No family/caregiver present to determine baseline cognitive functioning                                 General Comments: pt is oriented to person and birthdate only   General Comments  pt on RA, VSS with activity            Home Living Family/patient expects to be discharged to:: Private residence Living Arrangements: Spouse/significant other Available Help at Discharge: Family Type of Home: House                           Additional Comments: no family present, pt cognitively altered, OT obtaining limited history from chart review of this admission      Prior Functioning/Environment Level of Independence: Needs assistance  Gait / Transfers Assistance Needed: pt ambulates with a RW, unsure of distance tolerated ADL's / Homemaking Assistance Needed: pt requires assistance for all ADLs per chart   Comments: history obtained from provider note this admission as no family present and pt with AMS        OT Problem List: Decreased strength;Decreased range of motion;Impaired balance (sitting and/or standing);Decreased coordination;Decreased cognition;Decreased safety awareness      OT Treatment/Interventions: Self-care/ADL training;DME and/or AE instruction;Patient/family education;Balance training;Therapeutic activities;Therapeutic exercise    OT Goals(Current goals can be found in the care plan section) Acute Rehab OT Goals Patient Stated Goal: Did not state OT Goal Formulation: Patient unable to participate in goal setting Time For Goal Achievement: 04/18/20 Potential to Achieve Goals: Good  OT Frequency: Min 2X/week              AM-PAC OT "6 Clicks" Daily Activity      Outcome Measure Help from another person eating meals?: Total Help from another person taking care of personal grooming?: A Lot Help from another person toileting, which includes using toliet, bedpan, or urinal?: Total Help from another person bathing (including washing, rinsing, drying)?: Total Help from another person to put on and taking off regular upper body clothing?: Total Help from another person to put on and taking off regular lower body clothing?: Total 6 Click Score: 7   End of Session Equipment Utilized During Treatment: Gait belt Nurse Communication: Mobility status (pt back to bed)  Activity Tolerance: Patient limited by lethargy Patient left: in bed;with call bell/phone within reach;with bed alarm set  OT Visit Diagnosis: Unsteadiness on feet (R26.81);Other abnormalities of gait and mobility (R26.89);Muscle weakness (generalized) (M62.81);Other symptoms and signs involving cognitive function                Time: 6948-5462 OT Time Calculation (min): 20 min Charges:  OT General Charges $OT Visit: 1 Visit OT Evaluation $OT Eval Moderate Complexity: 1 Mod  [place], OTR/L Acute [employer] Pager [phone] Office [phone]     Almon Register 04/04/2020, 12:45 PM

## 2020-04-04 NOTE — Progress Notes (Signed)
Patient arrived to [id number]. Disoriented x4. When asked his name, pt stated "My name is Jack [name]". He denies any pain. CCMD called and verified telemetry. Bed at lowest position. Call bell within reach. Bed alarm on.

## 2020-04-04 NOTE — Progress Notes (Signed)
STROKE TEAM PROGRESS NOTE   INTERVAL HISTORY No family is at the bedside.  Pt lying in bed today, breathing much improved, no distress. He passed swallow and now on diet. Will resume home meds. He did well last night after seroquel and off restrain. CT repeat stable. BP stable.   Vitals:   04/04/20 0500 04/04/20 0600 04/04/20 0700 04/04/20 0800  BP: 122/77 137/74 133/66   Pulse: 90 86 85   Resp: 18 19 20    Temp:    98.3 F (36.8 C)  TempSrc:    Oral  SpO2: 94% 97% 98%    CBC:  Recent Labs  Lab 04/02/20 0929 04/03/20 1108  WBC 12.2* 11.0*  NEUTROABS 7.9*  --   HGB 13.1 11.6*  HCT 41.6 36.7*  MCV 91.6 91.8  PLT 330 416   Basic Metabolic Panel:  Recent Labs  Lab 04/02/20 0929  NA 140  K 3.3*  CL 104  CO2 24  GLUCOSE 139*  BUN 14  CREATININE 0.92  CALCIUM 8.8*   Lipid Panel:  Recent Labs  Lab 04/02/20 1300  CHOL 158  TRIG 116  HDL 32*  CHOLHDL 4.9  VLDL 23  LDLCALC 103*   HgbA1c:  Recent Labs  Lab 04/02/20 1300  HGBA1C 5.7*   Urine Drug Screen: No results for input(s): LABOPIA, COCAINSCRNUR, LABBENZ, AMPHETMU, THCU, LABBARB in the last 168 hours.  Alcohol Level No results for input(s): ETH in the last 168 hours.  IMAGING past 24 hours CT HEAD WO CONTRAST  Result Date: 04/04/2020 CLINICAL DATA:  Follow-up examination for intracranial hemorrhage. EXAM: CT HEAD WITHOUT CONTRAST TECHNIQUE: Contiguous axial images were obtained from the base of the skull through the vertex without intravenous contrast. COMPARISON:  Prior CT from 04/02/2020. FINDINGS: Brain: Hemorrhage centered near the left caudate head is slightly decreased in size measuring 2.1 x 1.7 x 1.7 cm on today's exam. Mild surrounding vasogenic edema without significant regional mass effect. Intraventricular extension with blood layering in both lateral ventricles, slightly increased. Overall ventricular size is stable without hydrocephalus or trapping. Trace subarachnoid blood noted at the posterior  left temporoparietal region, suspected be related to redistribution. No new hemorrhage. Underlying atrophy with extensive chronic ischemic changes again noted, stable. No acute large vessel territory infarct. No midline shift or extra-axial fluid collection. Vascular: No hyperdense vessel. Scattered vascular calcifications noted within the carotid siphons. Skull: Scalp soft tissues and calvarium unchanged. Sinuses/Orbits: Globes and orbital soft tissues demonstrate no acute finding. Small air-fluid level noted within the left sphenoid sinus. Paranasal sinuses are otherwise clear. No mastoid effusion. Other: None. IMPRESSION: 1. Slight interval decrease in size of left basal ganglia hemorrhage, now measuring 2.1 x 1.7 x 1.7 cm. Intraventricular extension with blood layering in both lateral ventricles, slightly increased. Overall ventricular size is stable without hydrocephalus or trapping. 2. Trace subarachnoid blood at the posterior left temporoparietal region, suspected to be related to redistribution. No new hemorrhage. 3. No other new acute intracranial abnormality. Electronically Signed   By: Jeannine Boga M.D.   On: 04/04/2020 02:57   DG CHEST PORT 1 VIEW  Result Date: 04/04/2020 CLINICAL DATA:  Pulmonary edema.  Wheezing. EXAM: PORTABLE CHEST 1 VIEW COMPARISON:  04/03/2020. FINDINGS: Prior CABG and cardiac valve replacement. Stable borderline cardiomegaly. Mild pulmonary venous congestion. Low lung volumes with mild left base subsegmental atelectasis. No pleural effusion. No pneumothorax. IMPRESSION: 1. Prior CABG and cardiac valve replacement. Stable borderline cardiomegaly. Mild pulmonary venous congestion. 2.  Low lung volumes.  Mild left base subsegment atelectasis. Electronically Signed   By: Marcello Moores  Register   On: 04/04/2020 07:27   DG CHEST PORT 1 VIEW  Result Date: 04/03/2020 CLINICAL DATA:  Pulmonary edema. EXAM: PORTABLE CHEST 1 VIEW COMPARISON:  04/02/2020. FINDINGS: Prior CABG and  cardiac valve replacements. Cardiomegaly. Mild pulmonary venous congestion cannot be excluded. Low lung volumes with mild bibasilar atelectasis. Small left pleural effusion cannot be excluded. No pneumothorax. IMPRESSION: 1. Prior CABG and cardiac valve replacements. Cardiomegaly. Mild pulmonary venous congestion cannot be excluded. 2. Low lung volumes with mild bibasilar atelectasis. Small left pleural effusion cannot be excluded. Electronically Signed   By: Marcello Moores  Register   On: 04/03/2020 12:33   DG Swallowing Func-Speech Pathology  Result Date: 04/04/2020 Objective Swallowing Evaluation: Type of Study: MBS-Modified Barium Swallow Study  Patient Details Name: Jack [name] MRN: [id number] Date of Birth: [birth year] Today's Date: 04/04/2020 Time: SLP Start Time (ACUTE ONLY): 0932 -SLP Stop Time (ACUTE ONLY): 0956 SLP Time Calculation (min) (ACUTE ONLY): 24 min Past Medical History: Past Medical History: Diagnosis Date . Acute CVA (cerebrovascular accident) ([place]) 07/05/2016 . Atrial fibrillation ([place])   Dr. Tollie Eth . BPH (benign prostatic hypertrophy) 2012  prior therapy with finasteride, but as of 03/2015 no c/o; Dr. Luanne Bras . Cellulitis of face 11/08  [facility] . CVA (cerebral infarction) 2009, 2015 . Echocardiogram abnormal 02/11/2010  LVEF 70%, severe mitral regurgitation, pulm HTN  . ED (erectile dysfunction)   Viagra prn, consult with Urology 2012 . Elevated PSA 2012  BPH; Dr. Luanne Bras, [facility]; no more PSAs needed as of 2012 consult . Gait abnormality   uses cane, s/p CVA . Hemiparesis affecting left side as late effect of cerebrovascular accident ([place])  . Hiatal hernia   per 2008 EUS . Hyperlipidemia  . Hypertension  . Microscopic hematuria   chronic, eval 2012 with Urology, benign at that time . Obstructive uropathy 2012  Dr. Luanne Bras . Pancreatic mass 01/2006  inconsequential per CT 2007 . 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  Patent foramen ovale   hx/o, notation of fistula on echocardiogram from 2009 . Prophylactic antibiotic 2016  requires due to valve disease . Schatzki's ring 2008  per endoscopic ultrasound; Dr. Janetta Hora. [name] . Stroke ([place])  . Ventral hernia   surgery consult 01/2010 with Dr. Excell Seltzer, elective repair if desired , but he declined . Wears hearing aid  Past Surgical History: Past Surgical History: Procedure Laterality Date . CARDIAC CATHETERIZATION  02/2010  LVEF 70%, severe mitral regurgitation, pulm HTN; prior in 01/2006 . INGUINAL HERNIA REPAIR    bilat . MITRAL VALVE REPAIR  01/2006  mitral repair with Maze procedure, tricuspid annuloplasty, MAZE and LAA oversewn by Dr. Roxy Manns HPI: Pt is an 86 yo male presenting with AMS. CT Head showed IVH bilaterally but most prominently seen in the L lateral ventricle and potentially originating from the L thalamus. PMH includes: hearing loss, multiple CVAs, schatzki's ring, HTN, HLD, HH, and dementia (MMSE 15/30 in 11/2019). Pt was seen for swallowing in 2015 s/p CVA, initially started on nectar thick liquids but advanced to thin liquids after MBS.  Subjective: pt alert but confused Assessment / Plan / Recommendation CHL IP CLINICAL IMPRESSIONS 04/04/2020 Clinical Impression Pt has a mild oropharyngeal dysphagia with suspected esophageal deficits, but with pharyngeal phase grossly functional. He has lingual pumping and delayed oral transit, leaving mild amounts of residue on the tongue despite consistency. He does not  really masticate the graham cracker despite cues. Instead, he mashes it against the roof of his mouth with his tongue until it starts to break down. Swallow initiation is triggered at the pyriform sinuses with thin liquids, but more so at the vllaeculae with other consistencies. This leads to penetration that is trace and transient with thin liquids, with liquids approaching the vocal folds but consistently clearing. Pt today is taking  bigger, more consecutive volumes and he still does not aspirate. Nectar thick liquids were also assessed and resulted in only a single episode of transient penetration that is more shallow within the laryngeal vestibule. Pt needed additional time and liquid washes to try to clear the barium tablet that appeared to remain in his distal esophagus. Subsequent liquid trials (which happened to be nectar thick) appeared to stay in his esophagus, with backward flow within the esophagus and even as far as into his pharynx, completely filling his pyriform sinuses. With use of a dry spoon, pt is cued to perform a dry swallow that helps clear both his pharynx and esophagus. Recommend starting with Dys 1 diet and thin liquids, crushing pills in puree. Will f/u clinically for potential to gradually advance solids back up.  SLP Visit Diagnosis Dysphagia, unspecified (R13.10) Attention and concentration deficit following -- Frontal lobe and executive function deficit following -- Impact on safety and function Mild aspiration risk   CHL IP TREATMENT RECOMMENDATION 04/04/2020 Treatment Recommendations Therapy as outlined in treatment plan below   Prognosis 04/04/2020 Prognosis for Safe Diet Advancement Good Barriers to Reach Goals Cognitive deficits Barriers/Prognosis Comment -- CHL IP DIET RECOMMENDATION 04/04/2020 SLP Diet Recommendations Dysphagia 1 (Puree) solids;Thin liquid Liquid Administration via Cup;Straw Medication Administration Crushed with puree Compensations Slow rate;Small sips/bites;Follow solids with liquid Postural Changes Remain semi-upright after after feeds/meals (Comment);Seated upright at 90 degrees   CHL IP OTHER RECOMMENDATIONS 04/04/2020 Recommended Consults -- Oral Care Recommendations Oral care BID Other Recommendations --   CHL IP FOLLOW UP RECOMMENDATIONS 04/04/2020 Follow up Recommendations Inpatient Rehab   CHL IP FREQUENCY AND DURATION 04/04/2020 Speech Therapy Frequency (ACUTE ONLY) min 2x/week Treatment  Duration 2 weeks      CHL IP ORAL PHASE 04/04/2020 Oral Phase Impaired Oral - Pudding Teaspoon -- Oral - Pudding Cup -- Oral - Honey Teaspoon -- Oral - Honey Cup -- Oral - Nectar Teaspoon -- Oral - Nectar Cup -- Oral - Nectar Straw Lingual pumping;Lingual/palatal residue;Delayed oral transit Oral - Thin Teaspoon Lingual pumping;Lingual/palatal residue;Delayed oral transit Oral - Thin Cup Lingual pumping;Lingual/palatal residue;Delayed oral transit Oral - Thin Straw Lingual pumping;Lingual/palatal residue;Delayed oral transit Oral - Puree Lingual pumping;Lingual/palatal residue;Delayed oral transit Oral - Mech Soft Lingual pumping;Lingual/palatal residue;Delayed oral transit;Impaired mastication Oral - Regular -- Oral - Multi-Consistency -- Oral - Pill Lingual pumping;Lingual/palatal residue;Delayed oral transit;Other (Comment) Oral Phase - Comment --  CHL IP PHARYNGEAL PHASE 04/04/2020 Pharyngeal Phase Impaired Pharyngeal- Pudding Teaspoon -- Pharyngeal -- Pharyngeal- Pudding Cup -- Pharyngeal -- Pharyngeal- Honey Teaspoon -- Pharyngeal -- Pharyngeal- Honey Cup -- Pharyngeal -- Pharyngeal- Nectar Teaspoon -- Pharyngeal -- Pharyngeal- Nectar Cup -- Pharyngeal -- Pharyngeal- Nectar Straw Penetration/Aspiration before swallow Pharyngeal Material enters airway, remains ABOVE vocal cords then ejected out Pharyngeal- Thin Teaspoon WFL Pharyngeal -- Pharyngeal- Thin Cup Penetration/Aspiration before swallow Pharyngeal Material enters airway, remains ABOVE vocal cords then ejected out Pharyngeal- Thin Straw Penetration/Aspiration before swallow Pharyngeal Material enters airway, remains ABOVE vocal cords then ejected out Pharyngeal- Puree WFL Pharyngeal -- Pharyngeal- Mechanical Soft WFL Pharyngeal -- Pharyngeal- Regular --  Pharyngeal -- Pharyngeal- Multi-consistency -- Pharyngeal -- Pharyngeal- Pill WFL Pharyngeal -- Pharyngeal Comment --  CHL IP CERVICAL ESOPHAGEAL PHASE 04/04/2020 Cervical Esophageal Phase Impaired Pudding  Teaspoon -- Pudding Cup -- Honey Teaspoon -- Honey Cup -- Nectar Teaspoon -- Nectar Cup -- Nectar Straw Esophageal backflow into the pharynx Thin Teaspoon WFL Thin Cup WFL Thin Straw WFL Puree WFL Mechanical Soft WFL Regular -- Multi-consistency -- Pill WFL Cervical Esophageal Comment -- Osie Bond., M.A. CCC-SLP Acute Rehabilitation Services Pager [phone] Office [phone] 04/04/2020, 10:29 AM               PHYSICAL EXAM    Temp:  [97.8 F (36.6 C)-99.6 F (37.6 C)] 98.3 F (36.8 C) (09/24 0800) Pulse Rate:  [76-98] 85 (09/24 0700) Resp:  [18-33] 20 (09/24 0700) BP: (114-159)/(66-112) 133/66 (09/24 0700) SpO2:  [93 %-100 %] 98 % (09/24 0700)  General - Well nourished, well developed, not in acute distress.  Ophthalmologic - fundi not visualized due to noncooperation.  Cardiovascular - irregularly irregular heart rate and rhythm.  Neuro - mild lethargic but eyes open. Orientated to self, age and place, but not to time. Spontaneous speech but paucity of speech, mild dysarthria, following most of the simple commands, able to name 4/4 but not cooperative with repeat. No gaze deviation, able to track bilaterally, blinking to visual threat bilaterally. Facial symmetrical, however not cooperative with facial movement. Tongue midline. LUE pronator drift, RUE no drift. RLE 3/5 proximal and 5/5 distal DF/PF. LLE 2/5 proximal, 3/5 distal DF/PF. Sensation symmetrical per pt, b/l FTN grossly intact. Gait not tested.   ASSESSMENT/PLAN Jack [name] is a 86 y.o. 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 Admitted to the neuro ICU.  Stroke: L caudate head ICH w/ IVH likely due to small vessel disease with HTN  CT head 1041 L>R IVH.  Small vessel disease. Atrophy.   CT head 1804 IVH stable. Suspect L caudate origin. Extensive small vessel disease.   CT head repeat stable hematoma and ventricle size  MRI 12/30/2019 no CAA  MRI 06/2016 no AVM or aneurysm  2D Echo EF 65-70%. No source of embolus. LA mildly dilated.   LDL 103  HgbA1c 5.7  VTE prophylaxis - SCDs  aspirin 81 mg daily prior to admission, now on No antithrombotic given hemorrhage.  Therapy recommendations:  CIR   Disposition:  pending   Acute pulmonary edema, resolved  Likely fluid overload due to LR @ 150cc for > 24 h  Stat CXR 9/23 - low volumes, mild basilar atx.   D/c IVF  Lasix 40mg  IV x 2 - 9/23  CXR repeat in am - pending  On diet now, encourage po intake  Leukocytosis   Fever reported in ER, none since  WBC 12.2->11.0  UA neg   Urine culture neg  Rocephin 9/22>>9/23  Hx stroke/TIA  2017 L MCA infarct felt to be embolic secondary to AF not on AC. not a long-term AC candidate d/t ICH. Put on ASA  2015 - thalamic ICH d/t HTN, enrolled in ATACH2  2009 - R MCA territory infarcts  Atrial Fibrillation  Home anticoagulation:  none d/t hx ICH   ASA 81 PTA  Now on no antithrombotics due to recurrent ICH.  Rate controlled   Hypertension  Home meds:  benicar 40, norvasc 5 bid  Now off Cleviprex . SBP < 160 now . Put back on home BP meds . Long-term BP goal normotensive  Hyperlipidemia  Home meds:  No statin  LDL 103, goal < 70 for ischemic stroke  Hold statin in setting of acute hemorrhage  Dysphagia . Secondary to stroke . Now passed swallow . On diet . Encourage po intake . Speech on board   Other Stroke Risk Factors  Advanced age  Known PFO  Other Active Problems  Vascular dementia  BPH on flomax  Hypokalemia K 3.3 - supplement  Hospital day # 2  This patient is critically ill due to [place] with IVH, hypertensive emergency, dysphagia, acute pulmonary edema and at significant risk of  neurological worsening, death form brain herniation, cerebral edema, obstructive hydrocephalus, aspiration, hypertensive encephalopathy, seizure. This patient's care requires constant monitoring of vital signs, hemodynamics, respiratory and cardiac monitoring, review of multiple databases, neurological assessment, discussion with family, other specialists and medical decision making of high complexity. I spent 35 minutes of neurocritical care time in the care of this patient.  Rosalin Hawking, MD PhD Stroke Neurology 04/04/2020 10:48 AM   To contact Stroke Continuity provider, please refer to [email or web address] After hours, contact General Neurology

## 2020-04-04 NOTE — Progress Notes (Signed)
Inpatient Rehabilitation Admissions Coordinator  I met with patient with his wife at bedside. We dicussed goals an expectations of a possible Cir admit. Patient was at [facility] 06/2016. Wife can provide 24/7 assist /supervision at home after Cir. I will begin insurance authorization with [employer] for a possible Cir admit when medically ready pending their approval.  Danne Baxter, RN, MSN Rehab Admissions Coordinator [phone] 04/04/2020 2:32 PM

## 2020-04-04 NOTE — Progress Notes (Signed)
Physical Therapy Treatment Patient Details Name: KENDRA WOOLFORD MRN: [id number] DOB: 1933/12/21 Today's Date: 04/04/2020    History of Present Illness 86 year old man with a prior history of atrial fibrillation not on anticoagulation due to [place], prior ischemic stroke in the left MCA territory December 2017, prior ICH-right thalamic/capsular -with residual left-sided weakness since 2015, vascular dementia with last MMSE 15/30 in May 2021 requiring a walker to walk at baseline and requires 24-hour assistance with ADLs, presented to the emergency room for evaluation of worsening confusion.    PT Comments    Pt tolerates treatment well with improved activity tolerance. Pt continues to require physical assistance for all functional mobility tasks with impaired initiation and preference for posterior lean during standing. Pt does demonstrate improved cognition this session, better oriented, but still demonstrates impaired awareness of deficits. Pt will benefit from continued acute PT POC to improve mobility quality and reduce falls risk. PT continues to recommend CIR at this time.   Follow Up Recommendations  CIR     Equipment Recommendations  Rolling walker with 5" wheels;Wheelchair (measurements PT);Wheelchair cushion (measurements PT) (if not owned already)    Recommendations for Other Services       Precautions / Restrictions Precautions Precautions: Fall Precaution Comments: monitor SpO2 Restrictions Weight Bearing Restrictions: No    Mobility  Bed Mobility Overal bed mobility: Needs Assistance Bed Mobility: Supine to Sit     Supine to sit: Mod assist     General bed mobility comments: pt requires initiation of LE movement and some assistance to elevate trunk into sitting  Transfers Overall transfer level: Needs assistance Equipment used: Rolling walker (2 wheeled);1 person hand held assist Transfers: Sit to/from [facility] Sit to Stand: Min assist Stand  pivot transfers: Mod assist       General transfer comment: minA sit to stand with use of RW to steady, initial posterior lean. Mod A SPT with HHA, posterior lean, pt reaching for armrest support  Ambulation/Gait Ambulation/Gait assistance: Min guard Gait Distance (Feet): 20 Feet Assistive device: Rolling walker (2 wheeled) Gait Pattern/deviations: Step-to pattern Gait velocity: reduced Gait velocity interpretation: <1.31 ft/sec, indicative of household ambulator General Gait Details: pt with short shuffling steps, RLE step length reduced compared to LLE   Stairs             Wheelchair Mobility    Modified Rankin (Stroke Patients Only) Modified Rankin (Stroke Patients Only) Pre-Morbid Rankin Score: Moderately severe disability Modified Rankin: Moderately severe disability     Balance Overall balance assessment: Needs assistance Sitting-balance support: Bilateral upper extremity supported;Feet supported Sitting balance-Leahy Scale: Poor Sitting balance - Comments: minA due to posterior lean Postural control: Posterior lean Standing balance support: Bilateral upper extremity supported Standing balance-Leahy Scale: Poor Standing balance comment: minG, reliant on BUE support of RW                            Cognition Arousal/Alertness: Awake/alert Behavior During Therapy: Flat affect Overall Cognitive Status: No family/caregiver present to determine baseline cognitive functioning                                 General Comments: pt is oriented to person, place, situation, but not time      Exercises      General Comments General comments (skin integrity, edema, etc.): pt on RA, VSS with activity  Pertinent Vitals/Pain Pain Assessment: Faces Faces Pain Scale: No hurt    Home Living                      Prior Function            PT Goals (current goals can now be found in the care plan section) Acute Rehab PT  Goals Patient Stated Goal: to get out of bed on his own Progress towards PT goals: Progressing toward goals    Frequency    Min 4X/week      PT Plan Current plan remains appropriate    Co-evaluation              AM-PAC PT "6 Clicks" Mobility   Outcome Measure  Help needed turning from your back to your side while in a flat bed without using bedrails?: A Little Help needed moving from lying on your back to sitting on the side of a flat bed without using bedrails?: A Lot Help needed moving to and from a bed to a chair (including a wheelchair)?: A Lot Help needed standing up from a chair using your arms (e.g., wheelchair or bedside chair)?: A Little Help needed to walk in hospital room?: A Little Help needed climbing 3-5 steps with a railing? : Total 6 Click Score: 14    End of Session Equipment Utilized During Treatment: Gait belt Activity Tolerance: Patient tolerated treatment well Patient left: in chair;with call bell/phone within reach;with chair alarm set Nurse Communication: Mobility status PT Visit Diagnosis: Unsteadiness on feet (R26.81);Other abnormalities of gait and mobility (R26.89);Muscle weakness (generalized) (M62.81);Other symptoms and signs involving the nervous system (R29.898)     Time: 9169-4503 PT Time Calculation (min) (ACUTE ONLY): 36 min  Charges:  $Gait Training: 8-22 mins $Therapeutic Activity: 8-22 mins                     Zenaida Niece, PT, DPT Acute Rehabilitation Pager: [phone]    Zenaida Niece 04/04/2020, 11:12 AM

## 2020-04-04 NOTE — PMR Pre-admission (Signed)
PMR Admission Coordinator Pre-Admission Assessment  Patient: Jack [name] is an 86 y.o., male MRN: [id number] DOB: Aug 28, 1933 Height:   Weight:                Insurance Information HMO: yes    PPO:      PCP:      IPA:      80/20:      OTHER:  PRIMARY: [employer] Medicare    Policy#: [id number]      Subscriber: pt CM Name: [name]    Phone#: [phone] option 8     Fax#: [phone] Pre-Cert#: [id number] approved for 7 days      Employer:  Benefits:  Phone #: [phone]     Name:  Eff. Date: 07/13/2019     Deduct: $200      Out of Pocket Max: $2200      Life Max: none  CIR: 100%      SNF: 100% 100 days Outpatient: 100%     Co-Pay: visits per medical neccesity Home Health: 100%      Co-Pay: visits per medical neccesity DME: 100%     Co-Pay: none Providers: in network  SECONDARY: none  Financial Counselor:       Phone#:   [employer]" for patients in Inpatient Rehabilitation Facilities with attached "Privacy Act [place] Records" was provided and verbally reviewed with: Family  Emergency Contact Information Contact Information    Name Relation Home Work Mobile   [place] R Spouse [phone]  [phone]   [place], Eid   [phone]     Current Medical History  Patient Admitting Diagnosis: CVA  History of Present Illness:  86 y.o. male with history of L-MCA infarct with residual L-HP and A fib --no anticoagulation due to prior ICH, vascular dementia 04/02/2020 with low-grade fever and worsening of confusion.   CT head done showing suspicion of hemorrhage from left basal ganglia, left caudate region felt to be hypertensive in nature as well as extensive microvascular changes. He was started on IV antibiotics as well as Cleviprex.   ASA discontinued and bleed felt to be likely CVA in nature. 2D echo showed EF 65 to 70% with mild LVH, moderate TVR and biatrial dilatation.  He developed acute pulmonary edema with shortness of breath 09/23 and  was treated with dose of Lasix.   Per reports, at baseline patient was able to ambulate with use of rolling walker.    Now on diet, need to encourage po intake. MBS 9.24  Dysphagia 1 with thin liquids, Meds crushed with puree.   Complete NIHSS TOTAL: 8 Glasgow Coma Scale Score: 14  Past Medical History  Past Medical History:  Diagnosis Date  . Acute CVA (cerebrovascular accident) ([place]) 07/05/2016  . Atrial fibrillation ([place])    Dr. Tollie Eth  . BPH (benign prostatic hypertrophy) 2012   prior therapy with finasteride, but as of 03/2015 no c/o; Dr. Luanne Bras  . Cellulitis of face 11/08   [facility]  . CVA (cerebral infarction) 2009, 2015  . Echocardiogram abnormal 02/11/2010   LVEF 70%, severe mitral regurgitation, pulm HTN   . ED (erectile dysfunction)    Viagra prn, consult with Urology 2012  . Elevated PSA 2012   BPH; Dr. Luanne Bras, [facility]; no more PSAs needed as of 2012 consult  . Gait abnormality    uses cane, s/p CVA  . Hemiparesis affecting left side as late effect of cerebrovascular accident ([place])   . Hiatal hernia  per 2008 EUS  . Hyperlipidemia   . Hypertension   . Microscopic hematuria    chronic, eval 2012 with Urology, benign at that time  . Obstructive uropathy 2012   Dr. Luanne Bras  . Pancreatic mass 01/2006   inconsequential per CT 2007  . 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 Patent foramen ovale    hx/o, notation of fistula on echocardiogram from 2009  . Prophylactic antibiotic 2016   requires due to valve disease  . Schatzki's ring 2008   per endoscopic ultrasound; Dr. Janetta Hora. [name]  . Stroke ([place])   . Ventral hernia    surgery consult 01/2010 with Dr. Excell Seltzer, elective repair if desired , but he declined  . Wears hearing aid     Family History  Family history is unknown by patient.  Prior Rehab/Hospitalizations:  Has the patient had prior  rehab or hospitalizations prior to admission? Yes CIR 2017  Has the patient had major surgery during 100 days prior to admission? No  Current Medications   Current Facility-Administered Medications:  .   stroke: mapping our early stages of recovery book, , Does not apply, Once, Amie Portland, MD .  acetaminophen (TYLENOL) tablet 650 mg, 650 mg, Oral, Q4H PRN **OR** acetaminophen (TYLENOL) 160 MG/5ML solution 650 mg, 650 mg, Per Tube, Q4H PRN **OR** acetaminophen (TYLENOL) suppository 650 mg, 650 mg, Rectal, Q4H PRN, Amie Portland, MD .  amLODipine (NORVASC) tablet 5 mg, 5 mg, Oral, BID, Kerney Elbe, MD, 5 mg at 04/08/20 1054 .  Chlorhexidine Gluconate Cloth 2 % PADS 6 each, 6 each, Topical, Daily, Jesusita Oka, MD, 6 each at 04/07/20 1102 .  donepezil (ARICEPT) tablet 10 mg, 10 mg, Oral, QHS, Rosalin Hawking, MD, 10 mg at 04/07/20 2154 .  heparin injection 5,000 Units, 5,000 Units, Subcutaneous, Q8H, Rosalin Hawking, MD, 5,000 Units at 04/08/20 0631 .  ipratropium (ATROVENT) nebulizer solution 0.5 mg, 0.5 mg, Nebulization, Q6H PRN, Biby, Sharon L, NP, 0.5 mg at 04/08/20 1130 .  irbesartan (AVAPRO) tablet 150 mg, 150 mg, Oral, Daily, Rosalin Hawking, MD, 150 mg at 04/08/20 1054 .  labetalol (NORMODYNE) injection 5-20 mg, 5-20 mg, Intravenous, Q2H PRN, Rosalin Hawking, MD .  pantoprazole (PROTONIX) EC tablet 40 mg, 40 mg, Oral, Daily, Rosalin Hawking, MD, 40 mg at 04/08/20 1054 .  potassium chloride (KLOR-CON) packet 20 mEq, 20 mEq, Oral, BID, Blenda Nicely, RPH, 20 mEq at 04/08/20 1054 .  QUEtiapine (SEROQUEL) tablet 25 mg, 25 mg, Oral, QHS, Rosalin Hawking, MD, 25 mg at 04/07/20 2154 .  senna-docusate (Senokot-S) tablet 1 tablet, 1 tablet, Oral, BID, Amie Portland, MD, 1 tablet at 04/08/20 1054 .  sodium chloride flush (NS) 0.9 % injection 3 mL, 3 mL, Intravenous, Once, Sherwood Gambler, MD .  tamsulosin (FLOMAX) capsule 0.4 mg, 0.4 mg, Oral, QPC breakfast, Rosalin Hawking, MD, 0.4 mg at 04/08/20 6606  Patients  Current Diet:  Diet Order            DIET - DYS 1 Room service appropriate? Yes with Assist; Fluid consistency: Thin  Diet effective now                 Precautions / Restrictions Precautions Precautions: Fall Precaution Comments: monitor SpO2 Restrictions Weight Bearing Restrictions: No   Has the patient had 2 or more falls or a fall with injury in the past year?No  Prior Activity Level Limited Community (1-2x/wk): sedentary, Mod I with RW, superivison  with adls by wife  Prior Functional Level Prior Function Level of Independence: Needs assistance Gait / Transfers Assistance Needed: pt ambulates with a RW, unsure of distance tolerated ADL's / Homemaking Assistance Needed: pt requires assistance for all ADLs per chart Comments: clairified with wife  Self Care: Did the patient need help bathing, dressing, using the toilet or eating?  Needed some help  Indoor Mobility: Did the patient need assistance with walking from room to room (with or without device)? Needed some help  Stairs: Did the patient need assistance with internal or external stairs (with or without device)? Needed some help  Functional Cognition: Did the patient need help planning regular tasks such as shopping or remembering to take medications? Needed some help  Home Assistive Devices / Equipment    Prior Device Use: Indicate devices/aids used by the patient prior to current illness, exacerbation or injury? Walker  Current Functional Level Cognition  Arousal/Alertness: Awake/alert Overall Cognitive Status: Difficult to assess Difficult to assess due to: Hard of hearing/deaf Orientation Level: Oriented to person General Comments: pt is oriented to person and birthdate only Attention: Sustained Sustained Attention: Impaired Sustained Attention Impairment: Verbal basic, Functional basic Memory: Impaired Memory Impairment: Storage deficit, Decreased short term memory, Retrieval deficit, Decreased long  term memory Decreased Long Term Memory: Verbal basic Decreased Short Term Memory: Verbal basic Awareness: Impaired Awareness Impairment: Emergent impairment, Intellectual impairment Problem Solving: Impaired Problem Solving Impairment: Functional basic Executive Function: Initiating Initiating: Impaired Initiating Impairment: Verbal basic, Functional basic    Extremity Assessment (includes Sensation/Coordination)  Upper Extremity Assessment: Generalized weakness RUE Deficits / Details: lifted both arms alternately with cues  when I was putting gait belt on him RUE Sensation: decreased light touch LUE Deficits / Details: lifted both arms alternately with cues  when I was putting gait belt on him LUE Sensation: decreased light touch LUE Coordination: decreased gross motor, decreased fine motor  Lower Extremity Assessment: Generalized weakness    ADLs  Overall ADL's : Needs assistance/impaired Eating/Feeding: Total assistance, Bed level Grooming: Wash/dry face, Set up, Supervision/safety, Bed level Grooming Details (indicate cue type and reason): HOB up Upper Body Bathing: Maximal assistance, Bed level Lower Body Bathing: Total assistance, Bed level Lower Body Bathing Details (indicate cue type and reason): Mod A sit<>stand Upper Body Dressing : Total assistance, Bed level Lower Body Dressing: Total assistance, Bed level Lower Body Dressing Details (indicate cue type and reason): Mod A sit<>stand Toilet Transfer: Maximal assistance, Buyer, retail Details (indicate cue type and reason): recliner>bed on his right; took 4 attempts of sit<>stand to get him to turn his feet to get to bed and A for weight shifts Toileting- Clothing Manipulation and Hygiene: Total assistance Toileting - Clothing Manipulation Details (indicate cue type and reason): Mod A sit<>stand    Mobility  Overal bed mobility: Needs Assistance Bed Mobility: Supine to Sit Supine to sit: Mod assist Sit  to supine: Max assist General bed mobility comments: A for trunk and legs, VCs for sequencing    Transfers  Overall transfer level: Needs assistance Equipment used: 1 person hand held assist, Rolling walker (2 wheeled) Transfers: Sit to/from Stand Sit to Stand: Mod assist, Max assist Stand pivot transfers: Max assist General transfer comment: Without RW, mod to stand and max to maintain standing balance. With the RW, mod to stand and gain/maintain standing balance.     Ambulation / Gait / Stairs / Wheelchair Mobility  Ambulation/Gait Ambulation/Gait assistance: Mod assist, Max assist Gait Distance (Feet): 15 Feet  Assistive device: Rolling walker (2 wheeled) Gait Pattern/deviations: Step-to pattern, Trunk flexed, Staggering left, Drifts right/left, Narrow base of support General Gait Details: Up to max assist to manage walker around turns. Pt with L lateral lean and a LOB to the L in which max assist was required to recover. Pt reaching out for the window sill during this time.  Gait velocity: Decreased Gait velocity interpretation: <1.31 ft/sec, indicative of household ambulator    Posture / Balance Dynamic Sitting Balance Sitting balance - Comments: minA due to posterior lean Balance Overall balance assessment: Needs assistance Sitting-balance support: Bilateral upper extremity supported, Feet supported Sitting balance-Leahy Scale: Poor Sitting balance - Comments: minA due to posterior lean Postural control: Posterior lean Standing balance support: Bilateral upper extremity supported Standing balance-Leahy Scale: Zero Standing balance comment: max assist required at times    Special needs/care consideration Visitor is wife, Rulon Eisenmenger [facility]     Previous Home Environment  Living Arrangements: Spouse/significant other  Lives With: Spouse Available Help at Discharge: Family, Available 24 hours/day Type of Home: House Home Layout: Multi-level, 1/2 bath on main level, Able to  live on main level with bedroom/bathroom Alternate Level Stairs-Number of Steps: multilevel/split level Home Access: Level entry Bathroom Shower/Tub: Chiropodist: Standard Bathroom Accessibility: Yes How Accessible: Accessible via walker Home Care Services: No Additional Comments: no family present, pt cognitively altered, OT obtaining limited history from chart review of this admission  Discharge Living Setting Plans for Discharge Living Setting: Patient's home, Lives with (comment) (wife) Type of Home at Discharge: House Discharge Home Layout: Multi-level, 1/2 bath on main level, Able to live on main level with bedroom/bathroom Alternate Level Stairs-Rails: Right Alternate Level Stairs-Number of Steps: 2 set of 7 steps with split level Discharge Home Access: Level entry Discharge Bathroom Shower/Tub: Tub/shower unit Discharge Bathroom Toilet: Standard Discharge Bathroom Accessibility: Yes How Accessible: Accessible via walker Does the patient have any problems obtaining your medications?: No  Social/Family/Support Systems Contact Information: wife, [name] Anticipated Caregiver: wife and one local son and dtr in law Anticipated Caregiver's Contact Information: see above Ability/Limitations of Caregiver: none Caregiver Availability: 24/7 Discharge Plan Discussed with Primary Caregiver: Yes Is Caregiver In Agreement with Plan?: Yes Does Caregiver/Family have Issues with Lodging/Transportation while Pt is in Rehab?: No  Goals Patient/Family Goal for Rehab: supervision to min assist wiht PT, OT, and SLP Expected length of stay: ELOS 2 to 3 weeks Pt/Family Agrees to Admission and willing to participate: Yes Program Orientation Provided & Reviewed with Pt/Caregiver Including Roles  & Responsibilities: Yes  Decrease burden of Care through IP rehab admission: n/a  Possible need for SNF placement upon discharge:not anticipated  Patient Condition: This patient's  medical and functional status has changed since the consult dated: 04/04/2020 in which the Rehabilitation Physician determined and documented that the patient's condition is appropriate for intensive rehabilitative care in an inpatient rehabilitation facility. See "History of Present Illness" (above) for medical update. Functional changes are: overall mod assist. Patient's medical and functional status update has been discussed with the Rehabilitation physician and patient remains appropriate for inpatient rehabilitation. Will admit to inpatient rehab today.  Preadmission Screen Completed By:  Cleatrice Burke, RN, 04/08/2020 1:38 PM ______________________________________________________________________   Discussed status with Dr. Ranell Patrick on 04/08/2020 at 1340 and received approval for admission today.  Admission Coordinator: Cleatrice Burke, time 6468 Date 04/08/2020

## 2020-04-05 LAB — CBC
HCT: 40 % (ref 39.0–52.0)
Hemoglobin: 13.2 g/dL (ref 13.0–17.0)
MCH: 29.7 pg (ref 26.0–34.0)
MCHC: 33 g/dL (ref 30.0–36.0)
MCV: 90.1 fL (ref 80.0–100.0)
Platelets: 302 10*3/uL (ref 150–400)
RBC: 4.44 MIL/uL (ref 4.22–5.81)
RDW: 14.6 % (ref 11.5–15.5)
WBC: 8.1 10*3/uL (ref 4.0–10.5)
nRBC: 0 % (ref 0.0–0.2)

## 2020-04-05 LAB — BASIC METABOLIC PANEL
Anion gap: 12 (ref 5–15)
BUN: 17 mg/dL (ref 8–23)
CO2: 29 mmol/L (ref 22–32)
Calcium: 8.7 mg/dL — ABNORMAL LOW (ref 8.9–10.3)
Chloride: 101 mmol/L (ref 98–111)
Creatinine, Ser: 0.84 mg/dL (ref 0.61–1.24)
GFR calc Af Amer: >60 mL/min (ref >60)
GFR calc non Af Amer: >60 mL/min (ref >60)
Glucose, Bld: 129 mg/dL — ABNORMAL HIGH (ref 70–99)
Potassium: 3.5 mmol/L (ref 3.5–5.1)
Sodium: 142 mmol/L (ref 135–145)

## 2020-04-05 MED ORDER — ONDANSETRON HCL 4 MG/2ML IJ SOLN
4.0000 mg | Freq: Once | INTRAMUSCULAR | Status: AC
  Administered 2020-04-05: 4 mg via INTRAVENOUS
  Filled 2020-04-05: qty 2

## 2020-04-05 NOTE — Progress Notes (Signed)
  Speech Language Pathology Treatment: Dysphagia  Patient Details Name: Jack [name] MRN: [id number] DOB: May 22, 1934 Today's Date: 04/05/2020 Time: 1140-1200 SLP Time Calculation (min) (ACUTE ONLY): 20 min  Assessment / Plan / Recommendation Clinical Impression  Patient seen to address dysphagia goals with SLP providing clinical observation of patient's toleration of lunch meal (Dys 1 puree and thin liquids). Patient required tray setup and intermittent assistance with opening containers, but he was able to feed self using utensils and manage liquids via straw sips from cup. He did not exhibit any overt s/s of oral phase impairments (no oral holding or residuals post swallows) but did exhibit one delayed, dry sounding cough. Patient consumed entire [employer] supplement (thick ice cream consistency and approximately 3 ounces of iced tea but only 2 bites of pureed carrots and 1-2 bites of pureed chicken.  Patient did request that SLP leave tray in front of him, "Im not through yet".    HPI HPI: Pt is an 86 yo male presenting with AMS. CT Head showed IVH bilaterally but most prominently seen in the L lateral ventricle and potentially originating from the L thalamus. PMH includes: hearing loss, multiple CVAs, schatzki's ring, HTN, HLD, HH, and dementia (MMSE 15/30 in 11/2019). Pt was seen for swallowing in 2015 s/p CVA, initially started on nectar thick liquids but advanced to thin liquids after MBS.      SLP Plan  Continue with current plan of care       Recommendations  Diet recommendations: Dysphagia 1 (puree);Thin liquid Liquids provided via: Cup;Straw Medication Administration: Crushed with puree Supervision: Patient able to self feed;Full supervision/cueing for compensatory strategies Compensations: Slow rate;Small sips/bites;Follow solids with liquid;Minimize environmental distractions Postural Changes and/or Swallow Maneuvers: Seated upright 90 degrees;Upright 30-60 min after meal                 Oral Care Recommendations: Oral care BID Follow up Recommendations: Inpatient Rehab SLP Visit Diagnosis: Dysphagia, unspecified (R13.10) Plan: Continue with current plan of care       [place], MA, [facility]

## 2020-04-05 NOTE — Progress Notes (Addendum)
STROKE TEAM PROGRESS NOTE   INTERVAL HISTORY Wife at the bedside. Pt lying in bed, had lunch, ate about 40%. Stated no HA or discomfort, orientated to place and age and people. Still has left UE drift. pending CIR.   Vitals:   04/04/20 1613 04/04/20 1947 04/04/20 2328 04/05/20 0320  BP: (!) 142/84 130/69 137/65 130/70  Pulse: 94 (!) 51 87 80  Resp: 20 18 18 16   Temp: 98.6 F (37 C) 99 F (37.2 C) 98.6 F (37 C) 98 F (36.7 C)  TempSrc: Axillary Oral Oral Oral  SpO2: 96% 95% 98% 97%   CBC:  Recent Labs  Lab 04/02/20 0929 04/03/20 1108 04/04/20 1029 04/05/20 0344  WBC 12.2*   < > 9.2 8.1  NEUTROABS 7.9*  --   --   --   HGB 13.1   < > 13.3 13.2  HCT 41.6   < > 39.9 40.0  MCV 91.6   < > 89.7 90.1  PLT 330   < > 308 302   < > = values in this interval not displayed.   Basic Metabolic Panel:  Recent Labs  Lab 04/04/20 1029 04/05/20 0344  NA 139 142  K 3.2* 3.5  CL 101 101  CO2 27 29  GLUCOSE 120* 129*  BUN 13 17  CREATININE 0.90 0.84  CALCIUM 8.7* 8.7*   Lipid Panel:  Recent Labs  Lab 04/02/20 1300  CHOL 158  TRIG 116  HDL 32*  CHOLHDL 4.9  VLDL 23  LDLCALC 103*   HgbA1c:  Recent Labs  Lab 04/02/20 1300  HGBA1C 5.7*   Urine Drug Screen: No results for input(s): LABOPIA, COCAINSCRNUR, LABBENZ, AMPHETMU, THCU, LABBARB in the last 168 hours.  Alcohol Level No results for input(s): ETH in the last 168 hours.  IMAGING past 24 hours DG Swallowing Func-Speech Pathology  Result Date: 04/04/2020 Objective Swallowing Evaluation: Type of Study: MBS-Modified Barium Swallow Study  Patient Details Name: CORBET HANLEY MRN: [id number] Date of Birth: 01/01/1934 Today's Date: 04/04/2020 Time: SLP Start Time (ACUTE ONLY): 0932 -SLP Stop Time (ACUTE ONLY): 0956 SLP Time Calculation (min) (ACUTE ONLY): 24 min Past Medical History: Past Medical History: Diagnosis Date . Acute CVA (cerebrovascular accident) ([place]) 07/05/2016 . Atrial fibrillation ([place])   Dr. Tollie Eth . BPH  (benign prostatic hypertrophy) 2012  prior therapy with finasteride, but as of 03/2015 no c/o; Dr. Luanne Bras . Cellulitis of face 11/08  [facility] ENT . CVA (cerebral infarction) 2009, 2015 . Echocardiogram abnormal 02/11/2010  LVEF 70%, severe mitral regurgitation, pulm HTN  . ED (erectile dysfunction)   Viagra prn, consult with Urology 2012 . Elevated PSA 2012  BPH; Dr. Luanne Bras, [facility]; no more PSAs needed as of 2012 consult . Gait abnormality   uses cane, s/p CVA . Hemiparesis affecting left side as late effect of cerebrovascular accident ([place])  . Hiatal hernia   per 2008 EUS . Hyperlipidemia  . Hypertension  . Microscopic hematuria   chronic, eval 2012 with Urology, benign at that time . Obstructive uropathy 2012  Dr. Luanne Bras . Pancreatic mass 01/2006  inconsequential per CT 2007 . 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 Patent foramen ovale   hx/o, notation of fistula on echocardiogram from 2009 . Prophylactic antibiotic 2016  requires due to valve disease . Schatzki's ring 2008  per endoscopic ultrasound; Dr. Janetta Hora. [name] . Stroke ([place])  . Ventral hernia   surgery consult 01/2010 with Dr. Marland Kitchen  [place], elective repair if desired , but he declined . Wears hearing aid  Past Surgical History: Past Surgical History: Procedure Laterality Date . CARDIAC CATHETERIZATION  02/2010  LVEF 70%, severe mitral regurgitation, pulm HTN; prior in 01/2006 . INGUINAL HERNIA REPAIR    bilat . MITRAL VALVE REPAIR  01/2006  mitral repair with Maze procedure, tricuspid annuloplasty, MAZE and LAA oversewn by Dr. Roxy Manns HPI: Pt is an 86 yo male presenting with AMS. CT Head showed IVH bilaterally but most prominently seen in the L lateral ventricle and potentially originating from the L thalamus. PMH includes: hearing loss, multiple CVAs, schatzki's ring, HTN, HLD, HH, and dementia (MMSE 15/30 in 11/2019). Pt was seen for swallowing in 2015 s/p CVA,  initially started on nectar thick liquids but advanced to thin liquids after MBS.  Subjective: pt alert but confused Assessment / Plan / Recommendation CHL IP CLINICAL IMPRESSIONS 04/04/2020 Clinical Impression Pt has a mild oropharyngeal dysphagia with suspected esophageal deficits, but with pharyngeal phase grossly functional. He has lingual pumping and delayed oral transit, leaving mild amounts of residue on the tongue despite consistency. He does not really masticate the graham cracker despite cues. Instead, he mashes it against the roof of his mouth with his tongue until it starts to break down. Swallow initiation is triggered at the pyriform sinuses with thin liquids, but more so at the vllaeculae with other consistencies. This leads to penetration that is trace and transient with thin liquids, with liquids approaching the vocal folds but consistently clearing. Pt today is taking bigger, more consecutive volumes and he still does not aspirate. Nectar thick liquids were also assessed and resulted in only a single episode of transient penetration that is more shallow within the laryngeal vestibule. Pt needed additional time and liquid washes to try to clear the barium tablet that appeared to remain in his distal esophagus. Subsequent liquid trials (which happened to be nectar thick) appeared to stay in his esophagus, with backward flow within the esophagus and even as far as into his pharynx, completely filling his pyriform sinuses. With use of a dry spoon, pt is cued to perform a dry swallow that helps clear both his pharynx and esophagus. Recommend starting with Dys 1 diet and thin liquids, crushing pills in puree. Will f/u clinically for potential to gradually advance solids back up.  SLP Visit Diagnosis Dysphagia, unspecified (R13.10) Attention and concentration deficit following -- Frontal lobe and executive function deficit following -- Impact on safety and function Mild aspiration risk   CHL IP TREATMENT  RECOMMENDATION 04/04/2020 Treatment Recommendations Therapy as outlined in treatment plan below   Prognosis 04/04/2020 Prognosis for Safe Diet Advancement Good Barriers to Reach Goals Cognitive deficits Barriers/Prognosis Comment -- CHL IP DIET RECOMMENDATION 04/04/2020 SLP Diet Recommendations Dysphagia 1 (Puree) solids;Thin liquid Liquid Administration via Cup;Straw Medication Administration Crushed with puree Compensations Slow rate;Small sips/bites;Follow solids with liquid Postural Changes Remain semi-upright after after feeds/meals (Comment);Seated upright at 90 degrees   CHL IP OTHER RECOMMENDATIONS 04/04/2020 Recommended Consults -- Oral Care Recommendations Oral care BID Other Recommendations --   CHL IP FOLLOW UP RECOMMENDATIONS 04/04/2020 Follow up Recommendations Inpatient Rehab   CHL IP FREQUENCY AND DURATION 04/04/2020 Speech Therapy Frequency (ACUTE ONLY) min 2x/week Treatment Duration 2 weeks      CHL IP ORAL PHASE 04/04/2020 Oral Phase Impaired Oral - Pudding Teaspoon -- Oral - Pudding Cup -- Oral - Honey Teaspoon -- Oral - Honey Cup -- Oral - Nectar Teaspoon -- Oral - Nectar Cup --  Oral - Nectar Straw Lingual pumping;Lingual/palatal residue;Delayed oral transit Oral - Thin Teaspoon Lingual pumping;Lingual/palatal residue;Delayed oral transit Oral - Thin Cup Lingual pumping;Lingual/palatal residue;Delayed oral transit Oral - Thin Straw Lingual pumping;Lingual/palatal residue;Delayed oral transit Oral - Puree Lingual pumping;Lingual/palatal residue;Delayed oral transit Oral - Mech Soft Lingual pumping;Lingual/palatal residue;Delayed oral transit;Impaired mastication Oral - Regular -- Oral - Multi-Consistency -- Oral - Pill Lingual pumping;Lingual/palatal residue;Delayed oral transit;Other (Comment) Oral Phase - Comment --  CHL IP PHARYNGEAL PHASE 04/04/2020 Pharyngeal Phase Impaired Pharyngeal- Pudding Teaspoon -- Pharyngeal -- Pharyngeal- Pudding Cup -- Pharyngeal -- Pharyngeal- Honey Teaspoon -- Pharyngeal  -- Pharyngeal- Honey Cup -- Pharyngeal -- Pharyngeal- Nectar Teaspoon -- Pharyngeal -- Pharyngeal- Nectar Cup -- Pharyngeal -- Pharyngeal- Nectar Straw Penetration/Aspiration before swallow Pharyngeal Material enters airway, remains ABOVE vocal cords then ejected out Pharyngeal- Thin Teaspoon WFL Pharyngeal -- Pharyngeal- Thin Cup Penetration/Aspiration before swallow Pharyngeal Material enters airway, remains ABOVE vocal cords then ejected out Pharyngeal- Thin Straw Penetration/Aspiration before swallow Pharyngeal Material enters airway, remains ABOVE vocal cords then ejected out Pharyngeal- Puree WFL Pharyngeal -- Pharyngeal- Mechanical Soft WFL Pharyngeal -- Pharyngeal- Regular -- Pharyngeal -- Pharyngeal- Multi-consistency -- Pharyngeal -- Pharyngeal- Pill WFL Pharyngeal -- Pharyngeal Comment --  CHL IP CERVICAL ESOPHAGEAL PHASE 04/04/2020 Cervical Esophageal Phase Impaired Pudding Teaspoon -- Pudding Cup -- Honey Teaspoon -- Honey Cup -- Nectar Teaspoon -- Nectar Cup -- Nectar Straw Esophageal backflow into the pharynx Thin Teaspoon WFL Thin Cup WFL Thin Straw WFL Puree WFL Mechanical Soft WFL Regular -- Multi-consistency -- Pill WFL Cervical Esophageal Comment -- Osie Bond., M.A. CCC-SLP Acute Rehabilitation Services Pager [phone] Office [phone] 04/04/2020, 10:29 AM               PHYSICAL EXAM    Temp:  [98 F (36.7 C)-99 F (37.2 C)] 98 F (36.7 C) (09/25 0320) Pulse Rate:  [51-101] 80 (09/25 0320) Resp:  [15-20] 16 (09/25 0320) BP: (117-146)/(62-102) 130/70 (09/25 0320) SpO2:  [95 %-98 %] 97 % (09/25 0320)  General - Well nourished, well developed, not in acute distress.  Ophthalmologic - fundi not visualized due to noncooperation.  Cardiovascular - irregularly irregular heart rate and rhythm.  Neuro - mild lethargic but eyes open, hard of hearing. Orientated to self, age and place, but not to time. Spontaneous speaking but paucity of speech, mild dysarthria, following most of the  simple commands, able to name 4/4 but not cooperative with repeat. No gaze deviation, able to track bilaterally, blinking to visual threat bilaterally. Facial symmetrical, however not cooperative with facial movement. Tongue midline. LUE pronator drift, RUE no drift. RLE 3/5 proximal and 5/5 distal DF/PF. LLE 2/5 proximal, 3/5 distal DF/PF. Sensation symmetrical per pt, b/l FTN grossly intact. Gait not tested.   ASSESSMENT/PLAN Mr. WILLIES LAVIOLETTE is a 86 y.o. 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 Admitted to the neuro ICU.  Stroke: L caudate head ICH w/ IVH likely due to small vessel disease with HTN  CT head 1041 L>R IVH. Small vessel disease. Atrophy.   CT head 1804 IVH stable. Suspect L caudate origin. Extensive small vessel disease.   CT head repeat stable hematoma and ventricle size  MRI 12/30/2019 no CAA  MRI 06/2016 no AVM or aneurysm  2D Echo  EF 65-70%. No source of embolus. LA mildly dilated.   LDL 103  HgbA1c 5.7  VTE prophylaxis - SCDs  aspirin 81 mg daily prior to admission, now on No antithrombotic given hemorrhage.  Therapy recommendations:  CIR   Disposition:  pending   Acute pulmonary edema, resolved  Likely fluid overload due to LR @ 150cc for > 24 h  Stat CXR 9/23 - low volumes, mild basilar atx.   D/c IVF  Lasix 40mg  IV x 2 - 9/23  CXR - 04/04/20 - Stable borderline cardiomegaly. Mild pulmonary venous congestion. Low lung volumes.  Mild left base subsegment atelectasis.  On diet now, encourage po intake  Leukocytosis   Fever reported in ER, none since - temp 98  WBC 12.2->11.0.. 9.2->8.1  UA neg   Urine culture neg  Rocephin 9/22>>9/23  Hx stroke/TIA  2017 L MCA infarct  felt to be embolic secondary to AF not on AC. not a long-term AC candidate d/t ICH. Put on ASA  2015 - R BG ICH d/t HTN, enrolled in ATACH2  2009 - R MCA territory infarcts  Atrial Fibrillation  Home anticoagulation:  none d/t hx ICH   ASA 81 PTA  Now on no antithrombotics due to recurrent ICH.  Rate controlled   Hypertension  Home meds:  benicar 40, norvasc 5 bid  Now off Cleviprex . SBP < 160 now (SBP 130's - 140's) . Put back on home BP meds . Long-term BP goal normotensive  Hyperlipidemia  Home meds:  No statin  LDL 103, goal < 70 for ischemic stroke  Hold statin in setting of acute hemorrhage  Dysphagia . Secondary to stroke . Now passed swallow . On diet . Encourage po intake . Speech on board   Other Stroke Risk Factors  Advanced age  Known PFO  Other Active Problems  Vascular dementia  BPH on flomax  Hypokalemia K 3.3 - supplement - 3.5  Hospital day # 3  Rosalin Hawking, MD PhD Stroke Neurology 04/05/2020 1:55 PM     To contact Stroke Continuity provider, please refer to [email or web address] After hours, contact General Neurology

## 2020-04-05 NOTE — Progress Notes (Signed)
°   04/05/20 2224  Provider Notification  Provider Name/Title Dr Cheral Marker  Date Provider Notified 04/05/20  Time Provider Notified 2225  Notification Type Page  Notification Reason Other (Comment) (pt c/o nausea, refused oral medications )  Response See new orders  Date of Provider Response 04/05/20  Time of Provider Response 2231

## 2020-04-05 NOTE — Evaluation (Signed)
Speech Language Pathology Evaluation Patient Details Name: Jack [name] MRN: [id number] DOB: [birth year] Today's Date: 04/05/2020 Time: 1200-1220 SLP Time Calculation (min) (ACUTE ONLY): 20 min  Problem List:  Patient Active Problem List   Diagnosis Date Noted  . Lesion of right ear 01/29/2019  . Vascular dementia with behavior disturbance ([place]) 01/29/2019  . HOH (hard of hearing) 01/29/2019  . Counseling regarding advanced directives 01/26/2019  . Benign prostatic hyperplasia with urinary frequency 01/26/2019  . Status post coronary artery bypass graft 05/05/2018  . Hyperglycemia 03/10/2017  . Elevated PSA, less than 10 ng/ml 03/10/2017  . Subcortical infarction ([place]) 07/09/2016  . PFO (patent foramen ovale)   . History of CVA with residual deficit   . Non compliance with medical treatment   . Hypokalemia   . Acute blood loss anemia   . Embolic stroke involving left middle cerebral artery ([place])   . TIA (transient ischemic attack) 07/03/2016  . Right sided weakness 07/03/2016  . Microscopic hematuria 04/03/2015  . Hyperlipidemia 04/03/2015  . Wears hearing aid 04/03/2015  . Changing skin lesion 04/03/2015  . Paresthesia and pain of left extremity 04/02/2015  . Encounter for health maintenance examination in adult 04/02/2015  . Hernia of abdominal cavity 04/02/2015  . Impaired gait and mobility 04/02/2015  . Essential hypertension 03/03/2015  . Spastic hemiplegia affecting nondominant side ([place]) 12/14/2013  . ICH (intracerebral hemorrhage) ([place]) 08/02/2013  . Status post mitral valve repair   . History of stroke   . History of atrial fibrillation   . Cyst and pseudocyst of pancreas 09/13/2007  . PVC (premature ventricular contraction)   . History of renal cyst    Past Medical History:  Past Medical History:  Diagnosis Date  . Acute CVA (cerebrovascular accident) ([place]) 07/05/2016  . Atrial fibrillation ([place])    Dr. Tollie Eth  . BPH (benign prostatic hypertrophy)  2012   prior therapy with finasteride, but as of 03/2015 no c/o; Dr. Luanne Bras  . Cellulitis of face 11/08   [facility]NT  . CVA (cerebral infarction) 2009, 2015  . Echocardiogram abnormal 02/11/2010   LVEF 70%, severe mitral regurgitation, pulm HTN   . ED (erectile dysfunction)    Viagra prn, consult with Urology 2012  . Elevated PSA 2012   BPH; Dr. Luanne Bras, [facility]; no more PSAs needed as of 2012 consult  . Gait abnormality    uses cane, s/p CVA  . Hemiparesis affecting left side as late effect of cerebrovascular accident ([place])   . Hiatal hernia    per 2008 EUS  . Hyperlipidemia   . Hypertension   . Microscopic hematuria    chronic, eval 2012 with Urology, benign at that time  . Obstructive uropathy 2012   Dr. Luanne Bras  . Pancreatic mass 01/2006   inconsequential per CT 2007  . 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 Patent foramen ovale    hx/o, notation of fistula on echocardiogram from 2009  . Prophylactic antibiotic 2016   requires due to valve disease  . Schatzki's ring 2008   per endoscopic ultrasound; Dr. Janetta Hora. [name]  . Stroke ([place])   . Ventral hernia    surgery consult 01/2010 with Dr. Excell Seltzer, elective repair if desired , but he declined  . Wears hearing aid    Past Surgical History:  Past Surgical History:  Procedure Laterality Date  . CARDIAC CATHETERIZATION  02/2010   LVEF 70%, severe mitral regurgitation, pulm  HTN; prior in 01/2006  . INGUINAL HERNIA REPAIR     bilat  . MITRAL VALVE REPAIR  01/2006   mitral repair with Maze procedure, tricuspid annuloplasty, MAZE and LAA oversewn by Dr. Roxy Manns   HPI:  Pt is an 86 yo male presenting with AMS. CT Head showed IVH bilaterally but most prominently seen in the L lateral ventricle and potentially originating from the L thalamus. PMH includes: hearing loss, multiple CVAs, schatzki's ring, HTN, HLD, HH, and dementia (MMSE  15/30 in 11/2019).   Assessment / Plan / Recommendation Clinical Impression  Patient presents with a moderate cognitive impairment but which is likely at or near baseline (h/o vascular dementia and multiple CVA's) as per discussion with patient's wife [name] (via phone call). She reported that patient sleeps a lot at home and will fall asleep very soon after any activity (meals, etc). She also reported that he gets around using a walker in the house. She denied any sundowning or periods of increased confusion. During today's evaluation, patient was oriented to self, able to state name, birthdate and age, able to tell SLP his home address and family member's names. He was aware that he was at "[facility]" and that "I had a stroke" but not oriented to month, day of week, year, date. He had difficulty answering questions related to PLOF aside from that he uses a walker. He was not able to tell SLP any interests, hobbies, or daily activities he engages in at home and he was not able to state what kind of help he required for ADL's at home. He is very [place] and wife had reported that he has hearing aides but one has been lost. (no hearing aides available during evaluation). Patient able to perform basic actions of self feeding, opened a container of ice cream, but exhibited poor initiation overall, slow cognitive processing and responses and no spontaneous commenting/requesting. Patient may benefit from skilled SLP intervention at next venue of care, but SLP will focus on addressing dysphagia during acute care stay and defer cognitive treatment to next venue.    SLP Assessment  SLP Recommendation/Assessment: All further Speech Lanaguage Pathology  needs can be addressed in the next venue of care SLP Visit Diagnosis: Cognitive communication deficit (Q22.297)    Follow Up Recommendations  Inpatient Rehab;24 hour supervision/assistance    Frequency and Duration   n/a        SLP Evaluation Cognition   Overall Cognitive Status: Difficult to assess (patient is likely at or slightly below baseline for cognition as per discussion with wife after evaluation) Arousal/Alertness: Awake/alert Orientation Level: Oriented to person;Disoriented to time;Oriented to place;Oriented to situation Attention: Sustained Sustained Attention: Impaired Sustained Attention Impairment: Verbal basic;Functional basic Memory: Impaired Memory Impairment: Storage deficit;Decreased short term memory;Retrieval deficit;Decreased long term memory Decreased Long Term Memory: Verbal basic Decreased Short Term Memory: Verbal basic Awareness: Impaired Awareness Impairment: Emergent impairment;Intellectual impairment Problem Solving: Impaired Problem Solving Impairment: Functional basic Executive Function: Initiating Initiating: Impaired Initiating Impairment: Verbal basic;Functional basic       Comprehension  Auditory Comprehension Overall Auditory Comprehension: Appears within functional limits for tasks assessed    Expression Expression Primary Mode of Expression: Verbal Verbal Expression Overall Verbal Expression: Appears within functional limits for tasks assessed   Oral / Motor  Oral Motor/Sensory Function Overall Oral Motor/Sensory Function: Mild impairment Facial ROM: Reduced left Facial Symmetry: Abnormal symmetry left Facial Strength: Within Functional Limits Lingual ROM: Within Functional Limits Lingual Symmetry: Within Functional Limits Lingual Strength: Reduced  Velum: Within Functional Limits Mandible: Within Functional Limits   GO                    Sonia Baller, MA, [place] Speech Therapy [facility] Acute Rehab

## 2020-04-06 LAB — CBC
HCT: 37.8 % — ABNORMAL LOW (ref 39.0–52.0)
Hemoglobin: 12.3 g/dL — ABNORMAL LOW (ref 13.0–17.0)
MCH: 30 pg (ref 26.0–34.0)
MCHC: 32.5 g/dL (ref 30.0–36.0)
MCV: 92.2 fL (ref 80.0–100.0)
Platelets: 286 10*3/uL (ref 150–400)
RBC: 4.1 MIL/uL — ABNORMAL LOW (ref 4.22–5.81)
RDW: 14.7 % (ref 11.5–15.5)
WBC: 8.9 10*3/uL (ref 4.0–10.5)
nRBC: 0 % (ref 0.0–0.2)

## 2020-04-06 LAB — BASIC METABOLIC PANEL
Anion gap: 9 (ref 5–15)
BUN: 17 mg/dL (ref 8–23)
CO2: 29 mmol/L (ref 22–32)
Calcium: 8.5 mg/dL — ABNORMAL LOW (ref 8.9–10.3)
Chloride: 105 mmol/L (ref 98–111)
Creatinine, Ser: 0.92 mg/dL (ref 0.61–1.24)
GFR calc Af Amer: >60 mL/min (ref >60)
GFR calc non Af Amer: >60 mL/min (ref >60)
Glucose, Bld: 115 mg/dL — ABNORMAL HIGH (ref 70–99)
Potassium: 3.4 mmol/L — ABNORMAL LOW (ref 3.5–5.1)
Sodium: 143 mmol/L (ref 135–145)

## 2020-04-06 MED ORDER — LORAZEPAM 2 MG/ML IJ SOLN
1.0000 mg | Freq: Once | INTRAMUSCULAR | Status: AC
  Administered 2020-04-06: 1 mg via INTRAVENOUS
  Filled 2020-04-06: qty 1

## 2020-04-06 MED ORDER — POTASSIUM CHLORIDE CRYS ER 20 MEQ PO TBCR
20.0000 meq | EXTENDED_RELEASE_TABLET | Freq: Two times a day (BID) | ORAL | Status: DC
  Administered 2020-04-06 – 2020-04-07 (×3): 20 meq via ORAL
  Filled 2020-04-06 (×3): qty 1

## 2020-04-06 NOTE — Progress Notes (Signed)
STROKE TEAM PROGRESS NOTE   INTERVAL HISTORY Patient lying comfortably in bed.  He remains confused and disoriented.  Vital signs are stable.  Continues to have mild left upper extremity weakness. Vitals:   04/05/20 2000 04/05/20 2323 04/06/20 0312 04/06/20 0829  BP: 116/62 (!) 110/51 127/74 132/75  Pulse: 76 (!) 52 75 79  Resp: 18 18 18 18   Temp: (!) 97.5 F (36.4 C) 98 F (36.7 C) 98.6 F (37 C) 98.6 F (37 C)  TempSrc: Oral  Oral Oral  SpO2: 98% 94% 95% 92%   CBC:  Recent Labs  Lab 04/02/20 0929 04/03/20 1108 04/05/20 0344 04/06/20 0635  WBC 12.2*   < > 8.1 8.9  NEUTROABS 7.9*  --   --   --   HGB 13.1   < > 13.2 12.3*  HCT 41.6   < > 40.0 37.8*  MCV 91.6   < > 90.1 92.2  PLT 330   < > 302 286   < > = values in this interval not displayed.   Basic Metabolic Panel:  Recent Labs  Lab 04/05/20 0344 04/06/20 0635  NA 142 143  K 3.5 3.4*  CL 101 105  CO2 29 29  GLUCOSE 129* 115*  BUN 17 17  CREATININE 0.84 0.92  CALCIUM 8.7* 8.5*   Lipid Panel:  Recent Labs  Lab 04/02/20 1300  CHOL 158  TRIG 116  HDL 32*  CHOLHDL 4.9  VLDL 23  LDLCALC 103*   HgbA1c:  Recent Labs  Lab 04/02/20 1300  HGBA1C 5.7*   Urine Drug Screen: No results for input(s): LABOPIA, COCAINSCRNUR, LABBENZ, AMPHETMU, THCU, LABBARB in the last 168 hours.  Alcohol Level No results for input(s): ETH in the last 168 hours.  IMAGING past 24 hours No results found.  PHYSICAL EXAM     Temp:  [97.5 F (36.4 C)-98.6 F (37 C)] 98.6 F (37 C) (09/26 0829) Pulse Rate:  [52-79] 79 (09/26 0829) Resp:  [18-20] 18 (09/26 0829) BP: (109-132)/(48-75) 132/75 (09/26 0829) SpO2:  [92 %-98 %] 92 % (09/26 0829)  General - Well nourished, well developed, not in acute distress.  Ophthalmologic - fundi not visualized due to noncooperation.  Cardiovascular - irregularly irregular heart rate and rhythm.  Neuro -sleepy but eyes open, hard of hearing. Orientated to self, age and place, but not to  time. Spontaneous speaking but paucity of speech, mild dysarthria, following most of the simple commands,   but not cooperative with repeat. No gaze deviation, able to track bilaterally, blinking to visual threat bilaterally. Facial symmetrical, however not cooperative with facial movement. Tongue midline. LUE pronator drift, RUE no drift.  Able to move both lower extremities off the bed but not cooperative for detailed testing sensation symmetrical per pt, b/l FTN grossly intact. Gait not tested.   ASSESSMENT/PLAN [name] is a 86 y.o. 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 Admitted to the neuro ICU.  Stroke: L caudate head ICH w/ IVH likely due to small vessel disease with HTN  CT head 1041 L>R IVH. Small vessel disease. Atrophy.   CT head 1804 IVH stable. Suspect L caudate origin. Extensive small vessel disease.   CT head repeat stable hematoma and ventricle size  MRI  12/30/2019 no CAA  MRI 06/2016 no AVM or aneurysm  2D Echo EF 65-70%. No source of embolus. LA mildly dilated.   LDL 103  HgbA1c 5.7  VTE prophylaxis - SCDs  aspirin 81 mg daily prior to admission, now on No antithrombotic given hemorrhage.  Therapy recommendations:  CIR (awaiting insurance approval)  Disposition:  pending   Acute pulmonary edema, resolved  Likely fluid overload due to LR @ 150cc for > 24 h  Stat CXR 9/23 - low volumes, mild basilar atx.   D/c IVF  Lasix 40mg  IV x 2 - 9/23  CXR - 04/04/20 - Stable borderline cardiomegaly. Mild pulmonary venous congestion. Low lung volumes.  Mild left base subsegment atelectasis.  On diet now, encourage po intake  Leukocytosis   Fever reported in ER, none since - temp 98  WBC  12.2->11.0.. 9.2->8.1->8.9  UA neg   Urine culture neg  Rocephin 9/22>>9/23  Hx stroke/TIA  2017 L MCA infarct felt to be embolic secondary to AF not on AC. not a long-term AC candidate d/t ICH. Put on ASA  2015 - R BG ICH d/t HTN, enrolled in ATACH2  2009 - R MCA territory infarcts  Atrial Fibrillation  Home anticoagulation:  none d/t hx ICH   ASA 81 PTA  Now on no antithrombotics due to recurrent ICH.  Rate controlled   Hypertension  Home meds:  benicar 40, norvasc 5 bid  Now off Cleviprex . SBP < 160 now (SBP 110 - 132 this AM) . Put back on home BP meds . Long-term BP goal normotensive  Hyperlipidemia  Home meds:  No statin  LDL 103, goal < 70 for ischemic stroke  Hold statin in setting of acute hemorrhage  Dysphagia . Secondary to stroke . Now passed swallow . On diet . Encourage po intake . Speech on board   Other Stroke Risk Factors  Advanced age  Known PFO  Other Active Problems  Vascular dementia  BPH on flomax  Hypokalemia K 3.3 - supplement - 3.5->3.[facility] day # 4  Continue ongoing management.  Await transfer to inpatient rehab when bed available.  Replace potassium. Discussed with pharmacist.  Greater than 50% time during this 25-minute visit was spent on counseling and coordination of care and discussion with care team about his intracerebral hemorrhage and plan of care Antony Contras, MD To contact Stroke Continuity provider, please refer to [email or web address] After hours, contact General Neurology

## 2020-04-07 ENCOUNTER — Inpatient Hospital Stay (HOSPITAL_COMMUNITY): Payer: Medicare Other

## 2020-04-07 DIAGNOSIS — I69391 Dysphagia following cerebral infarction: Secondary | ICD-10-CM

## 2020-04-07 LAB — BASIC METABOLIC PANEL
Anion gap: 10 (ref 5–15)
BUN: 20 mg/dL (ref 8–23)
CO2: 26 mmol/L (ref 22–32)
Calcium: 8.6 mg/dL — ABNORMAL LOW (ref 8.9–10.3)
Chloride: 108 mmol/L (ref 98–111)
Creatinine, Ser: 0.8 mg/dL (ref 0.61–1.24)
GFR calc Af Amer: >60 mL/min (ref >60)
GFR calc non Af Amer: >60 mL/min (ref >60)
Glucose, Bld: 114 mg/dL — ABNORMAL HIGH (ref 70–99)
Potassium: 4.5 mmol/L (ref 3.5–5.1)
Sodium: 144 mmol/L (ref 135–145)

## 2020-04-07 LAB — CBC
HCT: 37.5 % — ABNORMAL LOW (ref 39.0–52.0)
Hemoglobin: 11.6 g/dL — ABNORMAL LOW (ref 13.0–17.0)
MCH: 28.6 pg (ref 26.0–34.0)
MCHC: 30.9 g/dL (ref 30.0–36.0)
MCV: 92.4 fL (ref 80.0–100.0)
Platelets: 270 10*3/uL (ref 150–400)
RBC: 4.06 MIL/uL — ABNORMAL LOW (ref 4.22–5.81)
RDW: 14.8 % (ref 11.5–15.5)
WBC: 7.7 10*3/uL (ref 4.0–10.5)
nRBC: 0 % (ref 0.0–0.2)

## 2020-04-07 LAB — CULTURE, BLOOD (ROUTINE X 2)
Culture: NO GROWTH
Special Requests: ADEQUATE

## 2020-04-07 MED ORDER — IPRATROPIUM BROMIDE 0.02 % IN SOLN
0.5000 mg | Freq: Four times a day (QID) | RESPIRATORY_TRACT | Status: DC | PRN
  Administered 2020-04-08: 0.5 mg via RESPIRATORY_TRACT
  Filled 2020-04-07 (×2): qty 2.5

## 2020-04-07 MED ORDER — POTASSIUM CHLORIDE 20 MEQ PO PACK
20.0000 meq | PACK | Freq: Two times a day (BID) | ORAL | Status: DC
  Administered 2020-04-08 (×2): 20 meq via ORAL
  Filled 2020-04-07 (×2): qty 1

## 2020-04-07 NOTE — Discharge Summary (Addendum)
Stroke Discharge Summary  Patient ID: luiscarlos kaczmarczyk   MRN: [id number]      DOB: 23-Nov-1933  Date of Admission: 04/02/2020 Date of Discharge: 04/08/2020  Attending Physician:  Garvin Fila, MD, Stroke MD Consultant(s):   Leeroy Cha, MD (Physical Medicine & Rehabilitation)   Patient's PCP:  Flossie Buffy, NP  Discharge Diagnoses:  Principal Problem:   ICH (intracerebral hemorrhage) ([place]) - L caudate head w/ IVH and SAH-hypertensive etiology Active Problems:   History of atrial fibrillation   Spastic hemiplegia affecting nondominant side ([place])   Essential hypertension   Hyperlipidemia   PFO (patent foramen ovale)   History of CVA with residual deficit   Benign prostatic hyperplasia with urinary frequency   Vascular dementia with behavior disturbance ([place])   Dysphagia due to recent stroke   Medications to be continued on Rehab Allergies as of 04/08/2020      Reactions   Sulfa Antibiotics Swelling   Swelling in groin area      Medication List    STOP taking these medications   aspirin EC 81 MG tablet   olmesartan 40 MG tablet Commonly known as: BENICAR Replaced by: irbesartan 150 MG tablet   VITAMIN D3 PO     TAKE these medications   amLODipine 5 MG tablet Commonly known as: NORVASC Take 1 tablet (5 mg total) by mouth 2 (two) times daily. What changed:   when to take this  additional instructions   donepezil 10 MG tablet Commonly known as: Aricept Take 1 tablet (10 mg total) by mouth at bedtime.   heparin 5000 UNIT/ML injection Inject 1 mL (5,000 Units total) into the skin every 8 (eight) hours.   ipratropium 0.02 % nebulizer solution Commonly known as: ATROVENT Take 2.5 mLs (0.5 mg total) by nebulization every 6 (six) hours as needed for wheezing or shortness of breath.   irbesartan 150 MG tablet Commonly known as: AVAPRO Take 1 tablet (150 mg total) by mouth daily. Start taking on: April 09, 2020 Replaces: olmesartan 40 MG tablet    QUEtiapine 25 MG tablet Commonly known as: SEROQUEL Take 1 tablet (25 mg total) by mouth at bedtime.   senna-docusate 8.6-50 MG tablet Commonly known as: Senokot-S Take 1 tablet by mouth 2 (two) times daily.   sodium chloride flush 0.9 % Soln Commonly known as: NS Inject 3 mLs into the vein once for 1 dose.   tamsulosin 0.4 MG Caps capsule Commonly known as: FLOMAX Take 1 capsule (0.4 mg total) by mouth daily after breakfast. Start taking on: April 09, 2020 What changed: when to take this       LABORATORY STUDIES CBC    Component Value Date/Time   WBC 9.9 04/08/2020 0415   RBC 4.37 04/08/2020 0415   HGB 12.6 (L) 04/08/2020 0415   HCT 40.1 04/08/2020 0415   PLT 298 04/08/2020 0415   MCV 91.8 04/08/2020 0415   MCH 28.8 04/08/2020 0415   MCHC 31.4 04/08/2020 0415   RDW 14.8 04/08/2020 0415   LYMPHSABS 3.2 04/02/2020 0929   MONOABS 0.9 04/02/2020 0929   EOSABS 0.2 04/02/2020 0929   BASOSABS 0.1 04/02/2020 0929   CMP    Component Value Date/Time   NA 145 04/08/2020 0415   K 3.7 04/08/2020 0415   CL 107 04/08/2020 0415   CO2 28 04/08/2020 0415   GLUCOSE 131 (H) 04/08/2020 0415   BUN 18 04/08/2020 0415   CREATININE 0.77 04/08/2020 0415   CREATININE 0.87  01/26/2019 1434   CALCIUM 9.4 04/08/2020 0415   PROT 7.0 04/02/2020 0929   ALBUMIN 4.0 04/02/2020 0929   AST 24 04/02/2020 0929   ALT 15 04/02/2020 0929   ALKPHOS 40 04/02/2020 0929   BILITOT 0.8 04/02/2020 0929   GFRNONAA >60 04/08/2020 0415   GFRAA >60 04/08/2020 0415   COAGS Lab Results  Component Value Date   INR 1.1 04/02/2020   INR 1.2 12/27/2018   INR 1.21 08/04/2013   Lipid Panel    Component Value Date/Time   CHOL 158 04/02/2020 1300   TRIG 116 04/02/2020 1300   HDL 32 (L) 04/02/2020 1300   CHOLHDL 4.9 04/02/2020 1300   VLDL 23 04/02/2020 1300   LDLCALC 103 (H) 04/02/2020 1300   HgbA1C  Lab Results  Component Value Date   HGBA1C 5.7 (H) 04/02/2020   Urinalysis    Component  Value Date/Time   COLORURINE YELLOW 04/02/2020 1017   APPEARANCEUR HAZY (A) 04/02/2020 1017   APPEARANCEUR Clear 07/20/2016 1227   LABSPEC 1.014 04/02/2020 1017   PHURINE 7.0 04/02/2020 1017   GLUCOSEU NEGATIVE 04/02/2020 1017   HGBUR MODERATE (A) 04/02/2020 1017   BILIRUBINUR NEGATIVE 04/02/2020 1017   BILIRUBINUR Negative 07/20/2016 1227   KETONESUR NEGATIVE 04/02/2020 1017   PROTEINUR NEGATIVE 04/02/2020 1017   UROBILINOGEN negative 04/02/2015 1500   UROBILINOGEN 0.2 08/02/2013 1206   NITRITE NEGATIVE 04/02/2020 1017   LEUKOCYTESUR NEGATIVE 04/02/2020 1017    SIGNIFICANT DIAGNOSTIC STUDIES CT HEAD WO CONTRAST  Result Date: 04/04/2020 CLINICAL DATA:  Follow-up examination for intracranial hemorrhage. EXAM: CT HEAD WITHOUT CONTRAST TECHNIQUE: Contiguous axial images were obtained from the base of the skull through the vertex without intravenous contrast. COMPARISON:  Prior CT from 04/02/2020. FINDINGS: Brain: Hemorrhage centered near the left caudate head is slightly decreased in size measuring 2.1 x 1.7 x 1.7 cm on today's exam. Mild surrounding vasogenic edema without significant regional mass effect. Intraventricular extension with blood layering in both lateral ventricles, slightly increased. Overall ventricular size is stable without hydrocephalus or trapping. Trace subarachnoid blood noted at the posterior left temporoparietal region, suspected be related to redistribution. No new hemorrhage. Underlying atrophy with extensive chronic ischemic changes again noted, stable. No acute large vessel territory infarct. No midline shift or extra-axial fluid collection. Vascular: No hyperdense vessel. Scattered vascular calcifications noted within the carotid siphons. Skull: Scalp soft tissues and calvarium unchanged. Sinuses/Orbits: Globes and orbital soft tissues demonstrate no acute finding. Small air-fluid level noted within the left sphenoid sinus. Paranasal sinuses are otherwise clear. No  mastoid effusion. Other: None. IMPRESSION: 1. Slight interval decrease in size of left basal ganglia hemorrhage, now measuring 2.1 x 1.7 x 1.7 cm. Intraventricular extension with blood layering in both lateral ventricles, slightly increased. Overall ventricular size is stable without hydrocephalus or trapping. 2. Trace subarachnoid blood at the posterior left temporoparietal region, suspected to be related to redistribution. No new hemorrhage. 3. No other new acute intracranial abnormality. Electronically Signed   By: Jeannine Boga M.D.   On: 04/04/2020 02:57   CT HEAD WO CONTRAST  Result Date: 04/02/2020 CLINICAL DATA:  Acute neuro deficit.  Intracranial hemorrhage. EXAM: CT HEAD WITHOUT CONTRAST TECHNIQUE: Contiguous axial images were obtained from the base of the skull through the vertex without intravenous contrast. COMPARISON:  CT head 04/02/2020 FINDINGS: Brain: Hematoma in the left frontal horn is unchanged. This may be arising from the left caudate. Correlate with hypertension. Mild intraventricular hemorrhage in the occipital horns bilaterally unchanged. No new area  of hemorrhage. Generalized atrophy. Negative for hydrocephalus. Extensive white matter changes most likely due to chronic microvascular ischemia. Chronic infarct left cerebellum. Vascular: Negative for hyperdense vessel Skull: Negative Sinuses/Orbits: Paranasal sinuses clear.  Negative orbit Other: None IMPRESSION: Atrial ventricular hemorrhage is stable. No hydrocephalus. Suspect hemorrhage origin from the left basal ganglia/ left caudate region. Probable hypertensive hemorrhage Extensive chronic microvascular ischemic changes in the white matter. Electronically Signed   By: Franchot Gallo M.D.   On: 04/02/2020 18:04   CT HEAD WO CONTRAST  Result Date: 04/02/2020 CLINICAL DATA:  Altered mental status. EXAM: CT HEAD WITHOUT CONTRAST TECHNIQUE: Contiguous axial images were obtained from the base of the skull through the vertex  without intravenous contrast. COMPARISON:  December 27, 2018. FINDINGS: Brain: Moderate chronic ischemic white matter disease is noted. Mild diffuse cortical atrophy is noted. Large amount hemorrhage is noted in the left frontal horn as well as small amount of hemorrhage seen in both posterior horns. Potentially the hemorrhage may be originating in the left thalamus. No mass effect or midline shift is noted. No definite evidence of mass lesion or acute infarction is noted. Vascular: No hyperdense vessel or unexpected calcification. Skull: Normal. Negative for fracture or focal lesion. Sinuses/Orbits: No acute finding. Other: None. IMPRESSION: Intraventricular hemorrhage is noted bilaterally, but most prominently seen in the left lateral ventricle, and potentially may be originating from the left thalamus. No mass effect or midline shift is noted. Moderate chronic ischemic white matter disease is noted. Mild diffuse cortical atrophy is noted. Critical Value/emergent results were called by telephone at the time of interpretation on 04/02/2020 at 10:41 am to provider Sherwood Gambler , who verbally acknowledged these results. Electronically Signed   By: Marijo Conception M.D.   On: 04/02/2020 10:41   DG CHEST PORT 1 VIEW  Result Date: 04/07/2020 CLINICAL DATA:  Wheezing EXAM: PORTABLE CHEST 1 VIEW COMPARISON:  04/04/2020, 04/03/2020 FINDINGS: Post sternotomy changes and valve prosthesis. Mild atelectasis at the left base and CP angle. No consolidation, pleural effusion, or edema. Aortic atherosclerosis. No pneumothorax. Enteral contrast within the colon. Stable borderline cardiomegaly. IMPRESSION: Minimal atelectasis at the left base and CP angle. Electronically Signed   By: Donavan Foil M.D.   On: 04/07/2020 17:14   DG CHEST PORT 1 VIEW  Result Date: 04/04/2020 CLINICAL DATA:  Pulmonary edema.  Wheezing. EXAM: PORTABLE CHEST 1 VIEW COMPARISON:  04/03/2020. FINDINGS: Prior CABG and cardiac valve replacement. Stable  borderline cardiomegaly. Mild pulmonary venous congestion. Low lung volumes with mild left base subsegmental atelectasis. No pleural effusion. No pneumothorax. IMPRESSION: 1. Prior CABG and cardiac valve replacement. Stable borderline cardiomegaly. Mild pulmonary venous congestion. 2.  Low lung volumes.  Mild left base subsegment atelectasis. Electronically Signed   By: Marcello Moores  Register   On: 04/04/2020 07:27   DG CHEST PORT 1 VIEW  Result Date: 04/03/2020 CLINICAL DATA:  Pulmonary edema. EXAM: PORTABLE CHEST 1 VIEW COMPARISON:  04/02/2020. FINDINGS: Prior CABG and cardiac valve replacements. Cardiomegaly. Mild pulmonary venous congestion cannot be excluded. Low lung volumes with mild bibasilar atelectasis. Small left pleural effusion cannot be excluded. No pneumothorax. IMPRESSION: 1. Prior CABG and cardiac valve replacements. Cardiomegaly. Mild pulmonary venous congestion cannot be excluded. 2. Low lung volumes with mild bibasilar atelectasis. Small left pleural effusion cannot be excluded. Electronically Signed   By: Marcello Moores  Register   On: 04/03/2020 12:33   DG Chest Portable 1 View  Result Date: 04/02/2020 CLINICAL DATA:  Altered mental status EXAM: PORTABLE CHEST 1  VIEW COMPARISON:  02/23/2019 FINDINGS: Prior median sternotomy and valve replacement. Heart is normal size. No confluent opacities or effusions. No acute bony abnormality. IMPRESSION: No active disease. Electronically Signed   By: Rolm Baptise M.D.   On: 04/02/2020 10:37   DG Swallowing Func-Speech Pathology  Result Date: 04/04/2020 Objective Swallowing Evaluation: Type of Study: MBS-Modified Barium Swallow Study  Patient Details Name: DAMERE BRANDENBURG MRN: [id number] Date of Birth: 04-25-1934 Today's Date: 04/04/2020 Time: SLP Start Time (ACUTE ONLY): 0932 -SLP Stop Time (ACUTE ONLY): 0956 SLP Time Calculation (min) (ACUTE ONLY): 24 min Past Medical History: Past Medical History: Diagnosis Date . Acute CVA (cerebrovascular accident) ([place])  07/05/2016 . Atrial fibrillation ([place])   Dr. Tollie Eth . BPH (benign prostatic hypertrophy) 2012  prior therapy with finasteride, but as of 03/2015 no c/o; Dr. Luanne Bras . Cellulitis of face 11/08  [facility] . CVA (cerebral infarction) 2009, 2015 . Echocardiogram abnormal 02/11/2010  LVEF 70%, severe mitral regurgitation, pulm HTN  . ED (erectile dysfunction)   Viagra prn, consult with Urology 2012 . Elevated PSA 2012  BPH; Dr. Luanne Bras, [facility]; no more PSAs needed as of 2012 consult . Gait abnormality   uses cane, s/p CVA . Hemiparesis affecting left side as late effect of cerebrovascular accident ([place])  . Hiatal hernia   per 2008 EUS . Hyperlipidemia  . Hypertension  . Microscopic hematuria   chronic, eval 2012 with Urology, benign at that time . Obstructive uropathy 2012  Dr. Luanne Bras . Pancreatic mass 01/2006  inconsequential per CT 2007 . 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 Patent foramen ovale   hx/o, notation of fistula on echocardiogram from 2009 . Prophylactic antibiotic 2016  requires due to valve disease . Schatzki's ring 2008  per endoscopic ultrasound; Dr. Janetta Hora. [name] . Stroke ([place])  . Ventral hernia   surgery consult 01/2010 with Dr. Excell Seltzer, elective repair if desired , but he declined . Wears hearing aid  Past Surgical History: Past Surgical History: Procedure Laterality Date . CARDIAC CATHETERIZATION  02/2010  LVEF 70%, severe mitral regurgitation, pulm HTN; prior in 01/2006 . INGUINAL HERNIA REPAIR    bilat . MITRAL VALVE REPAIR  01/2006  mitral repair with Maze procedure, tricuspid annuloplasty, MAZE and LAA oversewn by Dr. Roxy Manns HPI: Pt is an 86 yo male presenting with AMS. CT Head showed IVH bilaterally but most prominently seen in the L lateral ventricle and potentially originating from the L thalamus. PMH includes: hearing loss, multiple CVAs, schatzki's ring, HTN, HLD, HH, and dementia (MMSE  15/30 in 11/2019). Pt was seen for swallowing in 2015 s/p CVA, initially started on nectar thick liquids but advanced to thin liquids after MBS.  Subjective: pt alert but confused Assessment / Plan / Recommendation CHL IP CLINICAL IMPRESSIONS 04/04/2020 Clinical Impression Pt has a mild oropharyngeal dysphagia with suspected esophageal deficits, but with pharyngeal phase grossly functional. He has lingual pumping and delayed oral transit, leaving mild amounts of residue on the tongue despite consistency. He does not really masticate the graham cracker despite cues. Instead, he mashes it against the roof of his mouth with his tongue until it starts to break down. Swallow initiation is triggered at the pyriform sinuses with thin liquids, but more so at the vllaeculae with other consistencies. This leads to penetration that is trace and transient with thin liquids, with liquids approaching the vocal folds but consistently clearing. Pt today is taking bigger, more consecutive volumes  and he still does not aspirate. Nectar thick liquids were also assessed and resulted in only a single episode of transient penetration that is more shallow within the laryngeal vestibule. Pt needed additional time and liquid washes to try to clear the barium tablet that appeared to remain in his distal esophagus. Subsequent liquid trials (which happened to be nectar thick) appeared to stay in his esophagus, with backward flow within the esophagus and even as far as into his pharynx, completely filling his pyriform sinuses. With use of a dry spoon, pt is cued to perform a dry swallow that helps clear both his pharynx and esophagus. Recommend starting with Dys 1 diet and thin liquids, crushing pills in puree. Will f/u clinically for potential to gradually advance solids back up.  SLP Visit Diagnosis Dysphagia, unspecified (R13.10) Attention and concentration deficit following -- Frontal lobe and executive function deficit following -- Impact  on safety and function Mild aspiration risk   CHL IP TREATMENT RECOMMENDATION 04/04/2020 Treatment Recommendations Therapy as outlined in treatment plan below   Prognosis 04/04/2020 Prognosis for Safe Diet Advancement Good Barriers to Reach Goals Cognitive deficits Barriers/Prognosis Comment -- CHL IP DIET RECOMMENDATION 04/04/2020 SLP Diet Recommendations Dysphagia 1 (Puree) solids;Thin liquid Liquid Administration via Cup;Straw Medication Administration Crushed with puree Compensations Slow rate;Small sips/bites;Follow solids with liquid Postural Changes Remain semi-upright after after feeds/meals (Comment);Seated upright at 90 degrees   CHL IP OTHER RECOMMENDATIONS 04/04/2020 Recommended Consults -- Oral Care Recommendations Oral care BID Other Recommendations --   CHL IP FOLLOW UP RECOMMENDATIONS 04/04/2020 Follow up Recommendations Inpatient Rehab   CHL IP FREQUENCY AND DURATION 04/04/2020 Speech Therapy Frequency (ACUTE ONLY) min 2x/week Treatment Duration 2 weeks      CHL IP ORAL PHASE 04/04/2020 Oral Phase Impaired Oral - Pudding Teaspoon -- Oral - Pudding Cup -- Oral - Honey Teaspoon -- Oral - Honey Cup -- Oral - Nectar Teaspoon -- Oral - Nectar Cup -- Oral - Nectar Straw Lingual pumping;Lingual/palatal residue;Delayed oral transit Oral - Thin Teaspoon Lingual pumping;Lingual/palatal residue;Delayed oral transit Oral - Thin Cup Lingual pumping;Lingual/palatal residue;Delayed oral transit Oral - Thin Straw Lingual pumping;Lingual/palatal residue;Delayed oral transit Oral - Puree Lingual pumping;Lingual/palatal residue;Delayed oral transit Oral - Mech Soft Lingual pumping;Lingual/palatal residue;Delayed oral transit;Impaired mastication Oral - Regular -- Oral - Multi-Consistency -- Oral - Pill Lingual pumping;Lingual/palatal residue;Delayed oral transit;Other (Comment) Oral Phase - Comment --  CHL IP PHARYNGEAL PHASE 04/04/2020 Pharyngeal Phase Impaired Pharyngeal- Pudding Teaspoon -- Pharyngeal -- Pharyngeal-  Pudding Cup -- Pharyngeal -- Pharyngeal- Honey Teaspoon -- Pharyngeal -- Pharyngeal- Honey Cup -- Pharyngeal -- Pharyngeal- Nectar Teaspoon -- Pharyngeal -- Pharyngeal- Nectar Cup -- Pharyngeal -- Pharyngeal- Nectar Straw Penetration/Aspiration before swallow Pharyngeal Material enters airway, remains ABOVE vocal cords then ejected out Pharyngeal- Thin Teaspoon WFL Pharyngeal -- Pharyngeal- Thin Cup Penetration/Aspiration before swallow Pharyngeal Material enters airway, remains ABOVE vocal cords then ejected out Pharyngeal- Thin Straw Penetration/Aspiration before swallow Pharyngeal Material enters airway, remains ABOVE vocal cords then ejected out Pharyngeal- Puree WFL Pharyngeal -- Pharyngeal- Mechanical Soft WFL Pharyngeal -- Pharyngeal- Regular -- Pharyngeal -- Pharyngeal- Multi-consistency -- Pharyngeal -- Pharyngeal- Pill WFL Pharyngeal -- Pharyngeal Comment --  CHL IP CERVICAL ESOPHAGEAL PHASE 04/04/2020 Cervical Esophageal Phase Impaired Pudding Teaspoon -- Pudding Cup -- Honey Teaspoon -- Honey Cup -- Nectar Teaspoon -- Nectar Cup -- Nectar Straw Esophageal backflow into the pharynx Thin Teaspoon WFL Thin Cup WFL Thin Straw WFL Puree WFL Mechanical Soft WFL Regular -- Multi-consistency -- Pill WFL Cervical Esophageal Comment --  Osie Bond., M.A. [place] [facility] Pager [phone] Office [phone] 04/04/2020, 10:29 AM              ECHOCARDIOGRAM COMPLETE  Result Date: 04/02/2020    ECHOCARDIOGRAM REPORT   Patient Name:   AN SCHNABEL Date of Exam: 04/02/2020 Medical Rec #:  [id number]     Height:       61.0 in Accession #:    [id number]    Weight:       144.0 lb Date of Birth:  07/31/1933      BSA:          1.643 m Patient Age:    58 years      BP:           130/82 mmHg Patient Gender: M             HR:           91 bpm. Exam Location:  Inpatient Procedure: 2D Echo Indications:    Stroke 434.91 / I163.9  History:        Patient has prior history of Echocardiogram examinations, most                  recent 07/17/2019. Prior CABG, Stroke, Arrythmias:Atrial                 Fibrillation; Risk Factors:Non-Smoker, Dyslipidemia and                 Hypertension. PFO, mitral repair with Maze procedure, tricuspid                 annuloplasty, MAZE and LAA oversewn by Dr. Roxy Manns 2007.                  Mitral Valve: prosthetic annuloplasty ring valve is present in                 the mitral position. Procedure Date: 2007.  Sonographer:    Leavy Cella Referring Phys: [id number] ASHISH ARORA IMPRESSIONS  1. Left ventricular ejection fraction, by estimation, is 65 to 70%. The left ventricle has normal function. The left ventricle has no regional wall motion abnormalities. There is mild left ventricular hypertrophy. Left ventricular diastolic function could not be evaluated.  2. Right ventricular systolic function was not well visualized. The right ventricular size is normal. There is normal pulmonary artery systolic pressure.  3. Left atrial size was moderately dilated.  4. Right atrial size was mild to moderately dilated.  5. The mitral valve has been repaired/replaced. Trivial mitral valve regurgitation. No evidence of mitral stenosis. There is a prosthetic annuloplasty ring present in the mitral position. Procedure Date: 2007.  6. Eccentric TR jet, best seen on image 22 and not well seen on other images. Jet is eccentric and likely moderate, but full jet width not well visualized. Cannot determine if regurgitation is inside or outside TV ring.. The tricuspid valve is has been repaired/replaced. The tricuspid valve is status post repair with an annuloplasty ring. Tricuspid valve regurgitation is moderate.  7. The aortic valve is tricuspid. There is mild calcification of the aortic valve. There is mild thickening of the aortic valve. Aortic valve regurgitation is not visualized. Mild aortic valve sclerosis is present, with no evidence of aortic valve stenosis.  8. The inferior vena cava is normal in size with  greater than 50% respiratory variability, suggesting right atrial pressure of 3 mmHg. Comparison(s): No significant change from prior study. Conclusion(s)/Recommendation(s): No intracardiac  source of embolism detected on this transthoracic study. A transesophageal echocardiogram is recommended to exclude cardiac source of embolism if clinically indicated. FINDINGS  Left Ventricle: Left ventricular ejection fraction, by estimation, is 65 to 70%. The left ventricle has normal function. The left ventricle has no regional wall motion abnormalities. The left ventricular internal cavity size was normal in size. There is  mild left ventricular hypertrophy. Left ventricular diastolic function could not be evaluated due to mitral valve repair. Left ventricular diastolic function could not be evaluated. Right Ventricle: The right ventricular size is normal. Right vetricular wall thickness was not well visualized. Right ventricular systolic function was not well visualized. There is normal pulmonary artery systolic pressure. The tricuspid regurgitant velocity is 1.97 m/s, and with an assumed right atrial pressure of 3 mmHg, the estimated right ventricular systolic pressure is 12.4 mmHg. Left Atrium: Left atrial size was moderately dilated. Right Atrium: Right atrial size was mild to moderately dilated. Pericardium: Trivial pericardial effusion is present. Mitral Valve: The mitral valve has been repaired/replaced. Trivial mitral valve regurgitation. There is a prosthetic annuloplasty ring present in the mitral position. Procedure Date: 2007. No evidence of mitral valve stenosis. Tricuspid Valve: Eccentric TR jet, best seen on image 22 and not well seen on other images. Jet is eccentric and likely moderate, but full jet width not well visualized. Cannot determine if regurgitation is inside or outside TV ring. The tricuspid valve is has been repaired/replaced. Tricuspid valve regurgitation is moderate. The tricuspid valve is  status post repair with an annuloplasty ring. Aortic Valve: The aortic valve is tricuspid. There is mild calcification of the aortic valve. There is mild thickening of the aortic valve. There is mild to moderate aortic valve annular calcification. Aortic valve regurgitation is not visualized. Mild aortic valve sclerosis is present, with no evidence of aortic valve stenosis. Pulmonic Valve: The pulmonic valve was not well visualized. Pulmonic valve regurgitation is mild. Aorta: The aortic root, ascending aorta and aortic arch are all structurally normal, with no evidence of dilitation or obstruction. Venous: The inferior vena cava is normal in size with greater than 50% respiratory variability, suggesting right atrial pressure of 3 mmHg. IAS/Shunts: The interatrial septum appears to be lipomatous. No atrial level shunt detected by color flow Doppler.  LEFT VENTRICLE PLAX 2D LVIDd:         3.25 cm  Diastology LVIDs:         2.10 cm  LV e' lateral:   8.49 cm/s LV PW:         1.55 cm  LV E/e' lateral: 15.1 LV IVS:        1.15 cm LVOT diam:     2.00 cm LVOT Area:     3.14 cm  LEFT ATRIUM             Index       RIGHT ATRIUM           Index LA diam:        4.40 cm 2.68 cm/m  RA Area:     16.90 cm LA Vol (A2C):   51.9 ml 31.60 ml/m RA Volume:   42.70 ml  25.99 ml/m LA Vol (A4C):   75.2 ml 45.78 ml/m LA Biplane Vol: 62.5 ml 38.05 ml/m   AORTA Ao Root diam: 2.90 cm MITRAL VALVE                TRICUSPID VALVE MV Area (PHT): 3.42 cm     TR Peak  grad:   15.5 mmHg MV Decel Time: 222 msec     TR Vmax:        197.00 cm/s MV E velocity: 128.00 cm/s                             SHUNTS                             Systemic Diam: 2.00 cm Buford Dresser MD Electronically signed by Buford Dresser MD Signature Date/Time: 04/02/2020/2:40:11 PM    Final        HISTORY OF PRESENT ILLNESS ATTILA MCCARTHY is an 86 y.o. 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  Ambulates baseline with a walker. He also has a history of atrial fibrillation not on anticoagulation, BPH, hypertension, hyperlipidemia and ventral hernia. He is followed in outpatient Neurology by Dr. Leonie Man with a diagnosis of mixed vascular dementia and alzheimer's dementia, started in 12/2018 on Aricept. Of note, his most recent neurology visit in May 2021 his blood pressure was elevated 160s-170s. At that visit his MMSE had declined to 15/30, previous was 23/30. The patient lives at home with his wife of 63+ years and has 3 sons. Modified Rankin: Rankin Score=3   The patient presented today via [facility] from home. Wife had reported 3 weeks of gait difficulty. She told EMS he went to bed last night around 9pm and woke with increased altered mental status. LKW 04/01/20 at 2100. Upon arrival he was tachycardic and febrile. A code sepsis was intitiated and ceftriaxone was started for a suspected UTI. Chest x-ray no active disease. WBC 12.2. Urine and blood cultures pending.     At 10:41 emergent head CT results were called to the ED Provider. Head CT Impression: "Intraventricular hemorrhage is noted bilaterally, but most prominently seen in the left lateral ventricle, and potentially may be originating from the left thalamus. No mass effect or midline shift is noted. Moderate chronic ischemic white matter disease is noted. Mild diffuse cortical atrophy is noted". Dr. Rory Percy with Neurology was notified and recommended activation of a Hemorrhage Code Stroke. Initial NIHSS 14.  Neurology spoke with the patient's wife at bedside. She shared that last night he ate dinner, he sat there for a long time, she seemed to have more difficulty getting him to bed than normal and now they are here. She provided little detail on the sequence of events. She shared that he does have some baseline memory difficulty and that he is normally able to ambulate with a walker. Dr. Rory Percy reviewed head CT images with his wife,  discussed the diagnosis and that he would be admitted to the ICU for monitoring. Code status and possibility of an EVD in the event of worsening were discussed with his wife, regarding what his wishes would be, she did not want to make a decision and said she would think about it more.     HOSPITAL COURSE Mr. TRAVARIS KOSH is a 86 y.o. male with history of a embolic left MCA stroke in 2017 and a thalamic ICH in 2015 with residual left-sided weakness, vascular dementia, atrial fibrillation not on anticoagulation due to [place], presenting with worsening confusion found to have [place] and IVH involving the left caudate head and bilateral ventricles. Admitted to the neuro ICU.  Stroke: L caudate head ICH w/ IVH  likely due to small vessel disease with HTN  CT head 1041 L>R IVH. Small vessel disease. Atrophy.   CT head 1804 IVH stable. Suspect L caudate origin. Extensive small vessel disease.   CT head repeat stable hematoma and ventricle size  MRI 12/30/2019 no CAA  MRI 06/2016 no AVM or aneurysm  2D Echo EF 65-70%. No source of embolus. LA mildly dilated.   LDL 103  HgbA1c 5.7  VTE prophylaxis - Heparin 5000 units sq tid   aspirin 81 mg daily prior to admission, now on No antithrombotic given hemorrhage.  Therapy recommendations:  CIR  Disposition:  CIR   Acute pulmonary edema  Likely fluid overload due to LR @ 150cc for > 24 h  Stat CXR 9/23 - low volumes, mild basilar atx.   D/c IVF  Lasix 40mg  IV x 2 - 9/23  CXR - 04/04/20 - Stable borderline cardiomegaly. Mild pulmonary venous congestion. Low lung volumes. Mild left base subsegment atelectasis.  On diet now, encourage po intake  Wheezing, added nebs.  CXR 9/27 minimal atx L base and CP angle - pt refused neb  Leukocytosis, resolved  Fever reported in ER, none since - temp 98  WBC 12.2->11.0.. 9.2->8.1->8.9->7.7->9.9  UA neg   Urine culture neg  Rocephin 9/22>>9/23  Hx stroke/TIA ? 2017 L MCA infarct felt to  be embolic secondary to AF not on AC. not a long-term AC candidate d/t ICH. Put on ASA ? 2015 - R BG ICH d/t HTN, enrolled in [place] ? 2009 - R MCA territory infarcts  Atrial Fibrillation  Home anticoagulation:  none d/t hx ICH   ASA 81 PTA  Now on no antithrombotics due to recurrent ICH.  Rate controlled   Hypertension  Home meds:  benicar 40, norvasc 5 bid  Now off Cleviprex  SBP < 160 now  Put back on home BP meds  Long-term BP goal normotensive  Hyperlipidemia  Home meds:  No statin  LDL 103, goal < 70 for ischemic stroke  Hold statin in setting of acute hemorrhage  Dysphagia  Secondary to stroke  Now passed swallow  On diet - D1 thin  Encourage po intake  Speech on board   Other Stroke Risk Factors  Advanced age  Known PFO  Other Active Problems  Vascular dementia  BPH on flomax  Hypokalemia K 3.3 - supplement - 3.5->3.4 ->4.5->3.7  - resolved  DISCHARGE EXAM Blood pressure 132/69, pulse (!) 101, temperature 98.1 F (36.7 C), temperature source Oral, resp. rate (!) 22, SpO2 95 %. General - Well nourished, well developed elderly Caucasian male, not in acute distress.  Ophthalmologic - fundi not visualized due to noncooperation.  Cardiovascular - irregularly irregular heart rate and rhythm.  Neuro -s awake alert hard of hearing. Orientated to self, age and place, but not to time. Spontaneous speaking but paucity of speech, mild dysarthria, following most of the simple commands,   but not cooperative with repeat. No gaze deviation, able to track bilaterally, blinking to visual threat bilaterally. Facial symmetrical, however not cooperative with facial movement. Tongue midline. LUE pronator drift, RUE no drift.  Able to move both lower extremities off the bed but not cooperative for detailed testing sensation symmetrical per pt, b/l FTN grossly intact. Gait not tested.  Discharge Diet  Dysphagia 1 thin liquids  DISCHARGE  PLAN  Disposition:  Transfer to [facility] for ongoing PT, OT and ST  Due to hemorrhage and risk of bleeding, do not take aspirin,  aspirin-containing medications, or ibuprofen products   Recommend ongoing stroke risk factor control by Primary Care Physician at time of discharge from inpatient rehabilitation.  Follow-up PCP [place], Charlene Brooke, NP in 2 weeks following discharge from rehab.  Follow-up in [facility] in 4 weeks following discharge from rehab, office to schedule an appointment.   35 minutes were spent preparing discharge.  Burnetta Sabin, MSN, APRN, ANVP-BC, AGPCNP-BC Advanced Practice Stroke Nurse [place] for Schedule & Pager information 04/08/2020 11:54 AM  I have personally obtained history,examined this patient, reviewed notes, independently viewed imaging studies, participated in medical decision making and plan of care.ROS completed by me personally and pertinent positives fully documented  I have made any additions or clarifications directly to the above note. Agree with note above.  Antony Contras, MD Medical Director [facility] Pager: [phone] 04/08/2020 4:51 PM

## 2020-04-07 NOTE — Progress Notes (Signed)
  Speech Language Pathology Treatment: Dysphagia  Patient Details Name: Jack [name] MRN: [id number] DOB: 10/13/1933 Today's Date: 04/07/2020 Time: 6837-2902 SLP Time Calculation (min) (ACUTE ONLY): 15 min  Assessment / Plan / Recommendation Clinical Impression  Pt seen at bedside for skilled ST intervention targeting goals for diet tolerance and advancement, and adherence to safe swallow strategies. Pt was resting in recliner. Wife present. Safe swallow precautions posted at [facility]. Pt appears to tolerate current diet (Dys1/thin) with crushed meds. Will continue to follow pt to assess tolerance of advanced textures, and readiness to advance solids.    HPI HPI: Pt is an 86 yo male presenting with AMS. CT Head showed IVH bilaterally but most prominently seen in the L lateral ventricle and potentially originating from the L thalamus. PMH includes: hearing loss, multiple CVAs, schatzki's ring, HTN, HLD, HH, and dementia (MMSE 15/30 in 11/2019).      SLP Plan  Continue with current plan of care       Recommendations  Diet recommendations: Dysphagia 1 (puree);Thin liquid Liquids provided via: Cup;Straw Medication Administration: Crushed with puree Supervision: Patient able to self feed;Full supervision/cueing for compensatory strategies Compensations: Slow rate;Small sips/bites;Follow solids with liquid;Minimize environmental distractions Postural Changes and/or Swallow Maneuvers: Seated upright 90 degrees;Upright 30-60 min after meal                Oral Care Recommendations: Oral care BID Follow up Recommendations: Inpatient Rehab;24 hour supervision/assistance SLP Visit Diagnosis: Dysphagia, unspecified (R13.10) Plan: Continue with current plan of care       Clarence B. Quentin Ore, [facility], [place] Speech Language Pathologist Office: [phone]  Shonna Chock 04/07/2020, 11:28 AM

## 2020-04-07 NOTE — Progress Notes (Signed)
STROKE TEAM PROGRESS NOTE   INTERVAL HISTORY His wife is at the bedside. He is up in gerichair at bedside.  Patient's wife feels she has good support at home and would like him to go to inpatient rehab.  Vital signs are stable.  No new changes.  Patient is more alert and interactive today.  Vitals:   04/06/20 2340 04/07/20 0343 04/07/20 0800 04/07/20 0911  BP: (!) 114/56 117/67 120/77 134/83  Pulse: 84 87 95   Resp: 20 20 18    Temp: 98.9 F (37.2 C) 97.6 F (36.4 C) 98.9 F (37.2 C)   TempSrc: Oral Axillary Oral   SpO2: 97% 96% 94%    CBC:  Recent Labs  Lab 04/02/20 0929 04/03/20 1108 04/06/20 0635 04/07/20 0338  WBC 12.2*   < > 8.9 7.7  NEUTROABS 7.9*  --   --   --   HGB 13.1   < > 12.3* 11.6*  HCT 41.6   < > 37.8* 37.5*  MCV 91.6   < > 92.2 92.4  PLT 330   < > 286 270   < > = values in this interval not displayed.   Basic Metabolic Panel:  Recent Labs  Lab 04/06/20 0635 04/07/20 0338  NA 143 144  K 3.4* 4.5  CL 105 108  CO2 29 26  GLUCOSE 115* 114*  BUN 17 20  CREATININE 0.92 0.80  CALCIUM 8.5* 8.6*    IMAGING past 24 hours No results found.  PHYSICAL EXAM     General - Well nourished, well developed elderly Caucasian male, not in acute distress.  Ophthalmologic - fundi not visualized due to noncooperation.  Cardiovascular - irregularly irregular heart rate and rhythm.  Neuro -s awake alert hard of hearing. Orientated to self, age and place, but not to time. Spontaneous speaking but paucity of speech, mild dysarthria, following most of the simple commands,   but not cooperative with repeat. No gaze deviation, able to track bilaterally, blinking to visual threat bilaterally. Facial symmetrical, however not cooperative with facial movement. Tongue midline. LUE pronator drift, RUE no drift.  Able to move both lower extremities off the bed but not cooperative for detailed testing sensation symmetrical per pt, b/l FTN grossly intact. Gait not  tested.   ASSESSMENT/PLAN [name] is a 86 y.o. male with history of a embolic left MCA stroke in 2017 and a thalamic ICH in 2015 with residual left-sided weakness, vascular dementia, atrial fibrillation not on anticoagulation due to [place], presenting with worsening confusion found to have Jack [name] and IVH involving the left caudate head and bilateral ventricles. Admitted to the neuro ICU.  Stroke: L caudate head ICH w/ IVH likely due to small vessel disease with HTN  CT head 1041 L>R IVH. Small vessel disease. Atrophy.   CT head 1804 IVH stable. Suspect L caudate origin. Extensive small vessel disease.   CT head repeat stable hematoma and ventricle size  MRI 12/30/2019 no CAA  MRI 06/2016 no AVM or aneurysm  2D Echo EF 65-70%. No source of embolus. LA mildly dilated.   LDL 103  HgbA1c 5.7  VTE prophylaxis - Heparin 5000 units sq tid   aspirin 81 mg daily prior to admission, now on No antithrombotic given hemorrhage.  Therapy recommendations:  CIR (awaiting insurance approval)  Disposition:  pending   Acute pulmonary edema, resolved  Likely fluid overload due to LR @ 150cc for > 24 h  Stat CXR 9/23 - low volumes, mild basilar atx.  D/c IVF  Lasix 40mg  IV x 2 - 9/23  CXR - 04/04/20 - Stable borderline cardiomegaly. Mild pulmonary venous congestion. Low lung volumes.  Mild left base subsegment atelectasis.  On diet now, encourage po intake  Leukocytosis, resolved  Fever reported in ER, none since - temp 98  WBC 12.2->11.0.. 9.2->8.1->8.9->7.7  UA neg   Urine culture neg  Rocephin 9/22>>9/23  Hx stroke/TIA  2017 L MCA infarct felt to be embolic secondary to AF not on AC. not a long-term AC candidate d/t ICH. Put on ASA  2015 - R BG ICH d/t HTN, enrolled in ATACH2  2009 - R MCA territory infarcts  Atrial Fibrillation  Home anticoagulation:  none d/t hx ICH   ASA 81 PTA  Now on no antithrombotics due to recurrent ICH.  Rate controlled    Hypertension  Home meds:  benicar 40, norvasc 5 bid  Now off Cleviprex . SBP < 160 now . Put back on home BP meds . Long-term BP goal normotensive  Hyperlipidemia  Home meds:  No statin  LDL 103, goal < 70 for ischemic stroke  Hold statin in setting of acute hemorrhage  Dysphagia . Secondary to stroke . Now passed swallow . On diet - D1 thin . Encourage po intake . Speech on board   Other Stroke Risk Factors  Advanced age  Known PFO  Other Active Problems  Vascular dementia  BPH on flomax  Hypokalemia K 3.3 - supplement - 3.5->3.4 ->4.5  - resolved  Hospital day # 5 Continue ongoing therapies.  Mobilize out of bed.  Transfer to inpatient rehab over the next few days when bed available.  Long discussion with patient and wife and answered questions.  Greater than 50% time during this 25-minute visit was spent on counseling and coordination of care about his intracerebral hemorrhage and discussion about rehab and answering questions Jack Contras, MD   To contact Stroke Continuity provider, please refer to [email or web address] After hours, contact General Neurology

## 2020-04-07 NOTE — Progress Notes (Signed)
Inpatient Rehab Admissions Coordinator:   I continue to await insurance authorization for CIR admit. I anticipate a response today or tomorrow. I will continue to follow for potential admit this week, pending insurance auth.   Clemens Catholic, [place], [place] Admissions Coordinator  [phone] ([place]) [phone] (office)

## 2020-04-07 NOTE — Progress Notes (Signed)
Physical Therapy Treatment Patient Details Name: Jack [name] MRN: [id number] DOB: Jul 26, 1933 Today's Date: 04/07/2020    History of Present Illness 86 year old man with a prior history of atrial fibrillation not on anticoagulation due to [place], prior ischemic stroke in the left MCA territory December 2017, prior ICH-right thalamic/capsular -with residual left-sided weakness since 2015, vascular dementia with last MMSE 15/30 in May 2021 requiring a walker to walk at baseline and requires 24-hour assistance with ADLs, presented to the emergency room for evaluation of worsening confusion.Found to have L caudate head ICH w/ IVH likely due to small vessel disease with HTN    PT Comments    Pt progressing slowly towards physical therapy goals. Required up to max assist with RW today for turns and general balance with ambulation. Session somewhat limited due to communication (pt very HOH) and cognition. Pt intermittently following commands and very flat throughout session, with limited engagement in therapeutic activity. Recommend +2 next session for chair follow to progress ambulation distance. Will continue to follow.   Follow Up Recommendations  CIR     Equipment Recommendations  Rolling walker with 5" wheels;Wheelchair (measurements PT);Wheelchair cushion (measurements PT) (if not owned already)    Recommendations for Other Services Rehab consult     Precautions / Restrictions Precautions Precautions: Fall Restrictions Weight Bearing Restrictions: No    Mobility  Bed Mobility Overal bed mobility: Needs Assistance Bed Mobility: Supine to Sit     Supine to sit: Mod assist     General bed mobility comments: A for trunk and legs, VCs for sequencing  Transfers Overall transfer level: Needs assistance Equipment used: 1 person hand held assist;Rolling walker (2 wheeled) Transfers: Sit to/from Stand Sit to Stand: Mod assist;Max assist         General transfer comment: Without RW,  mod to stand and max to maintain standing balance. With the RW, mod to stand and gain/maintain standing balance.   Ambulation/Gait Ambulation/Gait assistance: Mod assist;Max assist Gait Distance (Feet): 15 Feet Assistive device: Rolling walker (2 wheeled) Gait Pattern/deviations: Step-to pattern;Trunk flexed;Staggering left;Drifts right/left;Narrow base of support Gait velocity: Decreased Gait velocity interpretation: <1.31 ft/sec, indicative of household ambulator General Gait Details: Up to max assist to manage walker around turns. Pt with L lateral lean and a LOB to the L in which max assist was required to recover. Pt reaching out for the window sill during this time.    Stairs             Wheelchair Mobility    Modified Rankin (Stroke Patients Only) Modified Rankin (Stroke Patients Only) Pre-Morbid Rankin Score: Moderately severe disability Modified Rankin: Moderately severe disability     Balance Overall balance assessment: Needs assistance Sitting-balance support: Bilateral upper extremity supported;Feet supported Sitting balance-Leahy Scale: Poor Sitting balance - Comments: minA due to posterior lean Postural control: Posterior lean Standing balance support: Bilateral upper extremity supported Standing balance-Leahy Scale: Zero Standing balance comment: max assist required at times                            Cognition Arousal/Alertness: Awake/alert Behavior During Therapy: Flat affect Overall Cognitive Status: Difficult to assess                                        Exercises      General Comments  Pertinent Vitals/Pain Pain Assessment: Faces Faces Pain Scale: No hurt    Home Living                      Prior Function            PT Goals (current goals can now be found in the care plan section) Acute Rehab PT Goals Patient Stated Goal: Did not state PT Goal Formulation: With patient Time For Goal  Achievement: 04/17/20 Potential to Achieve Goals: Good Progress towards PT goals: Progressing toward goals    Frequency    Min 4X/week      PT Plan Current plan remains appropriate    Co-evaluation              AM-PAC PT "6 Clicks" Mobility   Outcome Measure  Help needed turning from your back to your side while in a flat bed without using bedrails?: A Little Help needed moving from lying on your back to sitting on the side of a flat bed without using bedrails?: A Lot Help needed moving to and from a bed to a chair (including a wheelchair)?: A Lot Help needed standing up from a chair using your arms (e.g., wheelchair or bedside chair)?: A Little Help needed to walk in hospital room?: A Little Help needed climbing 3-5 steps with a railing? : Total 6 Click Score: 14    End of Session Equipment Utilized During Treatment: Gait belt Activity Tolerance: Patient tolerated treatment well Patient left: in chair;with call bell/phone within reach;with chair alarm set (lap belt chair alarm) Nurse Communication: Mobility status;Other (comment) (Education on lap belt chair alarm) PT Visit Diagnosis: Unsteadiness on feet (R26.81);Other abnormalities of gait and mobility (R26.89);Muscle weakness (generalized) (M62.81);Other symptoms and signs involving the nervous system (I69.629)     Time: 5284-1324 PT Time Calculation (min) (ACUTE ONLY): 33 min  Charges:  $Gait Training: 23-37 mins                     Rolinda Roan, PT, DPT Acute Rehabilitation Services Pager: [phone] Office: [phone]    Thelma Comp 04/07/2020, 1:34 PM

## 2020-04-07 NOTE — Progress Notes (Addendum)
Pt was experiencing expiratory wheezing. Reached out to Dr. Leonie Man and a nebulizer treatment was ordered    1643 MD ordered breathing treatment for pt wheezing but pt refused to put nebulizer device in mouth

## 2020-04-08 ENCOUNTER — Encounter (HOSPITAL_COMMUNITY): Payer: Self-pay | Admitting: Physical Medicine & Rehabilitation

## 2020-04-08 ENCOUNTER — Inpatient Hospital Stay (HOSPITAL_COMMUNITY)
Admission: RE | Admit: 2020-04-08 | Discharge: 2020-04-23 | DRG: 057 | Disposition: A | Discharge status: Home Health | Payer: Medicare Other | Source: Intra-hospital | Attending: Physical Medicine & Rehabilitation | Admitting: Physical Medicine & Rehabilitation

## 2020-04-08 ENCOUNTER — Other Ambulatory Visit: Payer: Self-pay

## 2020-04-08 DIAGNOSIS — D72829 Elevated white blood cell count, unspecified: Secondary | ICD-10-CM | POA: Diagnosis present

## 2020-04-08 DIAGNOSIS — Z23 Encounter for immunization: Secondary | ICD-10-CM

## 2020-04-08 DIAGNOSIS — K222 Esophageal obstruction: Secondary | ICD-10-CM | POA: Diagnosis present

## 2020-04-08 DIAGNOSIS — I69354 Hemiplegia and hemiparesis following cerebral infarction affecting left non-dominant side: Secondary | ICD-10-CM | POA: Diagnosis not present

## 2020-04-08 DIAGNOSIS — Z79899 Other long term (current) drug therapy: Secondary | ICD-10-CM | POA: Diagnosis not present

## 2020-04-08 DIAGNOSIS — R29810 Facial weakness: Secondary | ICD-10-CM | POA: Diagnosis present

## 2020-04-08 DIAGNOSIS — I340 Nonrheumatic mitral (valve) insufficiency: Secondary | ICD-10-CM | POA: Diagnosis present

## 2020-04-08 DIAGNOSIS — I272 Pulmonary hypertension, unspecified: Secondary | ICD-10-CM | POA: Diagnosis present

## 2020-04-08 DIAGNOSIS — I610 Nontraumatic intracerebral hemorrhage in hemisphere, subcortical: Secondary | ICD-10-CM | POA: Diagnosis not present

## 2020-04-08 DIAGNOSIS — H9190 Unspecified hearing loss, unspecified ear: Secondary | ICD-10-CM | POA: Diagnosis present

## 2020-04-08 DIAGNOSIS — I959 Hypotension, unspecified: Secondary | ICD-10-CM | POA: Diagnosis not present

## 2020-04-08 DIAGNOSIS — I69020 Aphasia following nontraumatic subarachnoid hemorrhage: Secondary | ICD-10-CM | POA: Diagnosis not present

## 2020-04-08 DIAGNOSIS — R1310 Dysphagia, unspecified: Secondary | ICD-10-CM | POA: Diagnosis present

## 2020-04-08 DIAGNOSIS — I4891 Unspecified atrial fibrillation: Secondary | ICD-10-CM | POA: Diagnosis present

## 2020-04-08 DIAGNOSIS — R7303 Prediabetes: Secondary | ICD-10-CM | POA: Diagnosis present

## 2020-04-08 DIAGNOSIS — I69391 Dysphagia following cerebral infarction: Secondary | ICD-10-CM | POA: Diagnosis not present

## 2020-04-08 DIAGNOSIS — F0150 Vascular dementia without behavioral disturbance: Secondary | ICD-10-CM | POA: Diagnosis present

## 2020-04-08 DIAGNOSIS — F0390 Unspecified dementia without behavioral disturbance: Secondary | ICD-10-CM

## 2020-04-08 DIAGNOSIS — Z974 Presence of external hearing-aid: Secondary | ICD-10-CM | POA: Diagnosis not present

## 2020-04-08 DIAGNOSIS — I69098 Other sequelae following nontraumatic subarachnoid hemorrhage: Principal | ICD-10-CM

## 2020-04-08 DIAGNOSIS — Z882 Allergy status to sulfonamides status: Secondary | ICD-10-CM | POA: Diagnosis not present

## 2020-04-08 DIAGNOSIS — N400 Enlarged prostate without lower urinary tract symptoms: Secondary | ICD-10-CM | POA: Diagnosis present

## 2020-04-08 DIAGNOSIS — I6930 Unspecified sequelae of cerebral infarction: Secondary | ICD-10-CM

## 2020-04-08 DIAGNOSIS — I10 Essential (primary) hypertension: Secondary | ICD-10-CM | POA: Diagnosis present

## 2020-04-08 DIAGNOSIS — I619 Nontraumatic intracerebral hemorrhage, unspecified: Secondary | ICD-10-CM | POA: Diagnosis present

## 2020-04-08 DIAGNOSIS — R252 Cramp and spasm: Secondary | ICD-10-CM | POA: Diagnosis present

## 2020-04-08 DIAGNOSIS — I63412 Cerebral infarction due to embolism of left middle cerebral artery: Secondary | ICD-10-CM

## 2020-04-08 HISTORY — DX: Malignant (primary) neoplasm, unspecified: C80.1

## 2020-04-08 LAB — BASIC METABOLIC PANEL
Anion gap: 10 (ref 5–15)
BUN: 18 mg/dL (ref 8–23)
CO2: 28 mmol/L (ref 22–32)
Calcium: 9.4 mg/dL (ref 8.9–10.3)
Chloride: 107 mmol/L (ref 98–111)
Creatinine, Ser: 0.77 mg/dL (ref 0.61–1.24)
GFR calc Af Amer: >60 mL/min (ref >60)
GFR calc non Af Amer: >60 mL/min (ref >60)
Glucose, Bld: 131 mg/dL — ABNORMAL HIGH (ref 70–99)
Potassium: 3.7 mmol/L (ref 3.5–5.1)
Sodium: 145 mmol/L (ref 135–145)

## 2020-04-08 LAB — CBC
HCT: 40.1 % (ref 39.0–52.0)
Hemoglobin: 12.6 g/dL — ABNORMAL LOW (ref 13.0–17.0)
MCH: 28.8 pg (ref 26.0–34.0)
MCHC: 31.4 g/dL (ref 30.0–36.0)
MCV: 91.8 fL (ref 80.0–100.0)
Platelets: 298 10*3/uL (ref 150–400)
RBC: 4.37 MIL/uL (ref 4.22–5.81)
RDW: 14.8 % (ref 11.5–15.5)
WBC: 9.9 10*3/uL (ref 4.0–10.5)
nRBC: 0 % (ref 0.0–0.2)

## 2020-04-08 MED ORDER — IPRATROPIUM BROMIDE 0.02 % IN SOLN
0.5000 mg | Freq: Four times a day (QID) | RESPIRATORY_TRACT | Status: DC | PRN
  Administered 2020-04-13 – 2020-04-21 (×4): 0.5 mg via RESPIRATORY_TRACT
  Filled 2020-04-08 (×4): qty 2.5

## 2020-04-08 MED ORDER — DONEPEZIL HCL 10 MG PO TABS
10.0000 mg | ORAL_TABLET | Freq: Every day | ORAL | Status: DC
  Administered 2020-04-08 – 2020-04-22 (×15): 10 mg via ORAL
  Filled 2020-04-08 (×15): qty 1

## 2020-04-08 MED ORDER — TAMSULOSIN HCL 0.4 MG PO CAPS: 0.4000 mg | ORAL_CAPSULE | Freq: Every day | ORAL | Status: DC

## 2020-04-08 MED ORDER — POLYETHYLENE GLYCOL 3350 17 G PO PACK: 17.0000 g | PACK | Freq: Every day | ORAL | Status: DC | PRN

## 2020-04-08 MED ORDER — QUETIAPINE FUMARATE 25 MG PO TABS
25.0000 mg | ORAL_TABLET | Freq: Every day | ORAL | Status: DC
  Administered 2020-04-08 – 2020-04-22 (×15): 25 mg via ORAL
  Filled 2020-04-08 (×15): qty 1

## 2020-04-08 MED ORDER — PROCHLORPERAZINE 25 MG RE SUPP: 12.5000 mg | Freq: Four times a day (QID) | RECTAL | Status: DC | PRN

## 2020-04-08 MED ORDER — TRAZODONE HCL 50 MG PO TABS: 25.0000 mg | ORAL_TABLET | Freq: Every evening | ORAL | Status: DC | PRN

## 2020-04-08 MED ORDER — HEPARIN SODIUM (PORCINE) 5000 UNIT/ML IJ SOLN: 5000.0000 [IU] | Freq: Three times a day (TID) | INTRAMUSCULAR | Status: DC

## 2020-04-08 MED ORDER — ACETAMINOPHEN 160 MG/5ML PO SOLN: 650.0000 mg | ORAL | Status: DC | PRN

## 2020-04-08 MED ORDER — SENNOSIDES-DOCUSATE SODIUM 8.6-50 MG PO TABS
1.0000 | ORAL_TABLET | Freq: Two times a day (BID) | ORAL | Status: DC
  Administered 2020-04-08 – 2020-04-23 (×30): 1 via ORAL
  Filled 2020-04-08 (×30): qty 1

## 2020-04-08 MED ORDER — IRBESARTAN 150 MG PO TABS: 150.0000 mg | ORAL_TABLET | Freq: Every day | ORAL | 0 refills | Status: DC

## 2020-04-08 MED ORDER — QUETIAPINE FUMARATE 25 MG PO TABS: 25.0000 mg | ORAL_TABLET | Freq: Every day | ORAL | Status: DC

## 2020-04-08 MED ORDER — AMLODIPINE BESYLATE 5 MG PO TABS: 5.0000 mg | ORAL_TABLET | Freq: Two times a day (BID) | ORAL | Status: DC

## 2020-04-08 MED ORDER — IPRATROPIUM BROMIDE 0.02 % IN SOLN: 0.5000 mg | Freq: Four times a day (QID) | RESPIRATORY_TRACT | 12 refills | Status: DC | PRN

## 2020-04-08 MED ORDER — AMLODIPINE BESYLATE 5 MG PO TABS
5.0000 mg | ORAL_TABLET | Freq: Two times a day (BID) | ORAL | Status: DC
  Administered 2020-04-08 – 2020-04-12 (×9): 5 mg via ORAL
  Filled 2020-04-08 (×9): qty 1

## 2020-04-08 MED ORDER — SODIUM CHLORIDE 0.9% FLUSH: 3.0000 mL | Freq: Once | INTRAVENOUS | 0 refills | Status: DC

## 2020-04-08 MED ORDER — PROCHLORPERAZINE MALEATE 5 MG PO TABS: 5.0000 mg | ORAL_TABLET | Freq: Four times a day (QID) | ORAL | Status: DC | PRN

## 2020-04-08 MED ORDER — HEPARIN SODIUM (PORCINE) 5000 UNIT/ML IJ SOLN
5000.0000 [IU] | Freq: Three times a day (TID) | INTRAMUSCULAR | Status: DC
  Administered 2020-04-08 – 2020-04-23 (×44): 5000 [IU] via SUBCUTANEOUS
  Filled 2020-04-08 (×44): qty 1

## 2020-04-08 MED ORDER — DIPHENHYDRAMINE HCL 12.5 MG/5ML PO ELIX: 12.5000 mg | ORAL_SOLUTION | Freq: Four times a day (QID) | ORAL | Status: DC | PRN

## 2020-04-08 MED ORDER — SENNOSIDES-DOCUSATE SODIUM 8.6-50 MG PO TABS: 1.0000 | ORAL_TABLET | Freq: Two times a day (BID) | ORAL | Status: AC

## 2020-04-08 MED ORDER — BISACODYL 10 MG RE SUPP: 10.0000 mg | Freq: Every day | RECTAL | Status: DC | PRN

## 2020-04-08 MED ORDER — FLEET ENEMA 7-19 GM/118ML RE ENEM: 1.0000 | ENEMA | Freq: Once | RECTAL | Status: DC | PRN

## 2020-04-08 MED ORDER — GUAIFENESIN-DM 100-10 MG/5ML PO SYRP
5.0000 mL | ORAL_SOLUTION | Freq: Four times a day (QID) | ORAL | Status: DC | PRN
  Administered 2020-04-13: 10 mL via ORAL
  Filled 2020-04-08 (×2): qty 10

## 2020-04-08 MED ORDER — IRBESARTAN 300 MG PO TABS
150.0000 mg | ORAL_TABLET | Freq: Every day | ORAL | Status: DC
  Administered 2020-04-09 – 2020-04-23 (×15): 150 mg via ORAL
  Filled 2020-04-08 (×15): qty 1

## 2020-04-08 MED ORDER — ACETAMINOPHEN 650 MG RE SUPP: 650.0000 mg | RECTAL | Status: DC | PRN

## 2020-04-08 MED ORDER — MELATONIN 3 MG PO TABS
3.0000 mg | ORAL_TABLET | Freq: Every evening | ORAL | Status: DC | PRN
  Administered 2020-04-21: 3 mg via ORAL
  Filled 2020-04-08: qty 1

## 2020-04-08 MED ORDER — ACETAMINOPHEN 325 MG PO TABS: 325.0000 mg | ORAL_TABLET | ORAL | Status: DC | PRN

## 2020-04-08 MED ORDER — PANTOPRAZOLE SODIUM 40 MG PO TBEC
40.0000 mg | DELAYED_RELEASE_TABLET | Freq: Every day | ORAL | Status: DC
  Administered 2020-04-09 – 2020-04-23 (×15): 40 mg via ORAL
  Filled 2020-04-08 (×15): qty 1

## 2020-04-08 MED ORDER — TAMSULOSIN HCL 0.4 MG PO CAPS
0.4000 mg | ORAL_CAPSULE | Freq: Every day | ORAL | Status: DC
  Administered 2020-04-09 – 2020-04-14 (×6): 0.4 mg via ORAL
  Filled 2020-04-08 (×7): qty 1

## 2020-04-08 MED ORDER — ALUM & MAG HYDROXIDE-SIMETH 200-200-20 MG/5ML PO SUSP: 30.0000 mL | ORAL | Status: DC | PRN

## 2020-04-08 MED ORDER — PROCHLORPERAZINE EDISYLATE 10 MG/2ML IJ SOLN: 5.0000 mg | Freq: Four times a day (QID) | INTRAMUSCULAR | Status: DC | PRN

## 2020-04-08 MED ORDER — INFLUENZA VAC A&B SA ADJ QUAD 0.5 ML IM PRSY
0.5000 mL | PREFILLED_SYRINGE | INTRAMUSCULAR | Status: AC
  Administered 2020-04-09: 0.5 mL via INTRAMUSCULAR
  Filled 2020-04-08: qty 0.5

## 2020-04-08 MED ORDER — ACETAMINOPHEN 325 MG PO TABS: 650.0000 mg | ORAL_TABLET | ORAL | Status: DC | PRN

## 2020-04-08 MED ORDER — POTASSIUM CHLORIDE 20 MEQ PO PACK
20.0000 meq | PACK | Freq: Two times a day (BID) | ORAL | Status: AC
  Administered 2020-04-08: 20 meq via ORAL
  Filled 2020-04-08: qty 1

## 2020-04-08 MED ORDER — GLUCERNA SHAKE PO LIQD
237.0000 mL | Freq: Three times a day (TID) | ORAL | Status: DC
  Administered 2020-04-09 – 2020-04-10 (×4): 237 mL via ORAL

## 2020-04-08 NOTE — Progress Notes (Signed)
Inpatient Rehabilitation Medication Review by a Pharmacist  A complete drug regimen review was completed for this patient to identify any potential clinically significant medication issues.  Clinically significant medication issues were identified:  NO  Check AMION for pharmacist assigned to patient if future medication questions/issues arise during this admission.   Time spent performing this drug regimen review (minutes):  10 mins.   Blenda Nicely 04/08/2020 5:10 PM

## 2020-04-08 NOTE — Progress Notes (Signed)
Patient ID: Jack [name], male   DOB: 11/22/1933, 86 y.o.   MRN: [id number]  Pt arrived on unit per bed with all belongings. Pt oriented to room. Pt nonverbal. Wife phoned to complete admission assessment/history and plan of care.  Sheela Stack, LPN

## 2020-04-08 NOTE — H&P (Signed)
Physical Medicine and Rehabilitation Admission H&P    Chief Complaint  Patient presents with  . ICH with functional deficits.   : HPI:  Jack [name] is an 86 year old male with history of L-MCA infarct with residual left-HP, A. fib--no anticoagulation due to prior ICH, Schatzki's ring, vascular dementia (recent MMSE 15/30); who was admitted on 04/02/2020 with low-grade fever and worsening of confusion.  CT head done showing suspicion of hemorrhage from left basal ganglia and left caudate region felt to be hypertensive in nature.  He was febrile and tachycardic with positive UA at admission and treated with Rocephin briefly due to concerns of UTI.  Cleviprex added for BP control and ASA discontinued.  ICH/IVH felt to be due to small vessel disease with hypertension and lead to worsening of confusion.  2D echo done showing EF 65 to 70% with no source of emboli.  Hospital course significant for shortness of breath due to fluid overload treated with IV diuresis x2.  Follow-up chest x-ray shows minimal atelectasis left base--patient has refused nebulizers.  He has defervesced and leukocytosis resolving.  Statin on hold due to bleed.  Follow-up CT head showed slight decrease in left basal ganglia hemorrhage and slight increase in IVH without hydrocephalus.  Swallow evaluation done and patient placed on dysphagia 1, thin liquids.  Therapy ongoing with lethargy resolving but patient continues to have balance deficits with left alteral lean and tendency to lose balance to the left, shuffling gait as well as difficulty with ADLs.  CIR recommended due to functional decline.   Review of Systems  Unable to perform ROS: Mental acuity  Respiratory: Negative for shortness of breath (had some yesterday).   Neurological: Positive for focal weakness.  Psychiatric/Behavioral: Positive for memory loss.      Past Medical History:  Diagnosis Date  . Acute CVA (cerebrovascular accident) ([place]) 07/05/2016  .  Atrial fibrillation ([place])    Dr. Tollie Eth  . BPH (benign prostatic hypertrophy) 2012   prior therapy with finasteride, but as of 03/2015 no c/o; Dr. Luanne Bras  . Cancer ([place])   . Cellulitis of face 11/08   [facility]  . CVA (cerebral infarction) 2009, 2015  . Echocardiogram abnormal 02/11/2010   LVEF 70%, severe mitral regurgitation, pulm HTN   . ED (erectile dysfunction)    Viagra prn, consult with Urology 2012  . Elevated PSA 2012   BPH; Dr. Luanne Bras, [facility]; no more PSAs needed as of 2012 consult  . Gait abnormality    uses cane, s/p CVA  . Hemiparesis affecting left side as late effect of cerebrovascular accident ([place])   . Hiatal hernia    per 2008 EUS  . Hyperlipidemia   . Hypertension   . Microscopic hematuria    chronic, eval 2012 with Urology, benign at that time  . Obstructive uropathy 2012   Dr. Luanne Bras  . Pancreatic mass 01/2006   inconsequential per CT 2007  . 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 Patent foramen ovale    hx/o, notation of fistula on echocardiogram from 2009  . Prophylactic antibiotic 2016   requires due to valve disease  . Schatzki's ring 2008   per endoscopic ultrasound; Dr. Janetta Hora. [name]  . Stroke ([place])   . Ventral hernia    surgery consult 01/2010 with Dr. Excell Seltzer, elective repair if desired , but he declined  . Wears hearing aid  Past Surgical History:  Procedure Laterality Date  . CARDIAC CATHETERIZATION  02/2010   LVEF 70%, severe mitral regurgitation, pulm HTN; prior in 01/2006  . INGUINAL HERNIA REPAIR     bilat  . MITRAL VALVE REPAIR  01/2006   mitral repair with Maze procedure, tricuspid annuloplasty, MAZE and LAA oversewn by Dr. Roxy Manns    Family History  Family history unknown: Yes   Social History:  reports that he has never smoked. He has never used smokeless tobacco. He reports that he does not drink alcohol and does  not use drugs.     Allergies  Allergen Reactions  . Sulfa Antibiotics Swelling    Swelling in groin area   Medications Prior to Admission  Medication Sig Dispense Refill  . amLODipine (NORVASC) 5 MG tablet Take 1 tablet (5 mg total) by mouth 2 (two) times daily.    Marland Kitchen donepezil (ARICEPT) 10 MG tablet Take 1 tablet (10 mg total) by mouth at bedtime. 90 tablet 3  . heparin 5000 UNIT/ML injection Inject 1 mL (5,000 Units total) into the skin every 8 (eight) hours. 1 mL   . ipratropium (ATROVENT) 0.02 % nebulizer solution Take 2.5 mLs (0.5 mg total) by nebulization every 6 (six) hours as needed for wheezing or shortness of breath. 75 mL 12  . [START ON 04/09/2020] irbesartan (AVAPRO) 150 MG tablet Take 1 tablet (150 mg total) by mouth daily.  0  . QUEtiapine (SEROQUEL) 25 MG tablet Take 1 tablet (25 mg total) by mouth at bedtime.    . senna-docusate (SENOKOT-S) 8.6-50 MG tablet Take 1 tablet by mouth 2 (two) times daily.    . sodium chloride flush (NS) 0.9 % SOLN Inject 3 mLs into the vein once for 1 dose. 3 mL 0  . [START ON 04/09/2020] tamsulosin (FLOMAX) 0.4 MG CAPS capsule Take 1 capsule (0.4 mg total) by mouth daily after breakfast. 30 capsule     Drug Regimen Review  Drug regimen was reviewed and remains appropriate with no significant issues identified  Home:     Functional History:    Functional Status:  Mobility:          ADL:    Cognition: Cognition Orientation Level: Oriented to person    Physical Exam: There were no vitals taken for this visit.  General: Was resting--aroused easily but very disoriented with minimal verbal output.  HEENT: Head is normocephalic, atraumatic, PERRLA, EOMI, sclera anicteric, oral mucosa pink and moist, dentition intact, ext ear canals clear,  Neck: Supple without JVD or lymphadenopathy Heart: Rhythm irregular.  Chest: Puffy breaths noted without increase in WOB with exam.   Abdomen: Midline bulging due to hernia --soft and  nontender.   Extremities: No clubbing, cyanosis, or edema. Pulses are 2+ Skin: Clean and intact without signs of breakdown Neuro: Unable to state his name today and his verbal output consisted of "No". He needed max verbal and tactile cues to follow simple motor commands. Has left facial weakness with left spastic hemiparesis LE>UE, but unable to engage in MMT. Can sit at EOB for few seconds without support Psych: Pt's affect is appropriate. Pt is cooperative       Results for orders placed or performed during the hospital encounter of 04/02/20 (from the past 48 hour(s))  CBC     Status: Abnormal   Collection Time: 04/07/20  3:38 AM  Result Value Ref Range   WBC 7.7 4.0 - 10.5 K/uL   RBC 4.06 (L) 4.22 - 5.81 MIL/uL  Hemoglobin 11.6 (L) 13.0 - 17.0 g/dL   HCT 37.5 (L) 39 - 52 %   MCV 92.4 80.0 - 100.0 fL   MCH 28.6 26.0 - 34.0 pg   MCHC 30.9 30.0 - 36.0 g/dL   RDW 14.8 11.5 - 15.5 %   Platelets 270 150 - 400 K/uL   nRBC 0.0 0.0 - 0.2 %    Comment: Performed at [facility] [address] [place], [place] [address]  Basic metabolic panel     Status: Abnormal   Collection Time: 04/07/20  3:38 AM  Result Value Ref Range   Sodium 144 135 - 145 mmol/L   Potassium 4.5 3.5 - 5.1 mmol/L    Comment: SLIGHT HEMOLYSIS   Chloride 108 98 - 111 mmol/L   CO2 26 22 - 32 mmol/L   Glucose, Bld 114 (H) 70 - 99 mg/dL    Comment: Glucose reference range applies only to samples taken after fasting for at least 8 hours.   BUN 20 8 - 23 mg/dL   Creatinine, Ser 0.80 0.61 - 1.24 mg/dL   Calcium 8.6 (L) 8.9 - 10.3 mg/dL   GFR calc non Af Amer >60 >60 mL/min   GFR calc Af Amer >60 >60 mL/min   Anion gap 10 5 - 15    Comment: Performed at [facility] [address] [place], [place] [address]  CBC     Status: Abnormal   Collection Time: 04/08/20  4:15 AM  Result Value Ref Range   WBC 9.9 4.0 - 10.5 K/uL   RBC 4.37 4.22 - 5.81 MIL/uL   Hemoglobin 12.6 (L) 13.0 - 17.0 g/dL   HCT 40.1  39 - 52 %   MCV 91.8 80.0 - 100.0 fL   MCH 28.8 26.0 - 34.0 pg   MCHC 31.4 30.0 - 36.0 g/dL   RDW 14.8 11.5 - 15.5 %   Platelets 298 150 - 400 K/uL   nRBC 0.0 0.0 - 0.2 %    Comment: Performed at [facility], [place] [address], [place], [place] [address]  Basic metabolic panel     Status: Abnormal   Collection Time: 04/08/20  4:15 AM  Result Value Ref Range   Sodium 145 135 - 145 mmol/L   Potassium 3.7 3.5 - 5.1 mmol/L   Chloride 107 98 - 111 mmol/L   CO2 28 22 - 32 mmol/L   Glucose, Bld 131 (H) 70 - 99 mg/dL    Comment: Glucose reference range applies only to samples taken after fasting for at least 8 hours.   BUN 18 8 - 23 mg/dL   Creatinine, Ser 0.77 0.61 - 1.24 mg/dL   Calcium 9.4 8.9 - 10.3 mg/dL   GFR calc non Af Amer >60 >60 mL/min   GFR calc Af Amer >60 >60 mL/min   Anion gap 10 5 - 15    Comment: Performed at [facility] [address] [place], [place] [address]   DG CHEST PORT 1 VIEW  Result Date: 04/07/2020 CLINICAL DATA:  Wheezing EXAM: PORTABLE CHEST 1 VIEW COMPARISON:  04/04/2020, 04/03/2020 FINDINGS: Post sternotomy changes and valve prosthesis. Mild atelectasis at the left base and CP angle. No consolidation, pleural effusion, or edema. Aortic atherosclerosis. No pneumothorax. Enteral contrast within the colon. Stable borderline cardiomegaly. IMPRESSION: Minimal atelectasis at the left base and CP angle. Electronically Signed   By: Donavan Foil M.D.   On: 04/07/2020 17:14  Medical Problem List and Plan: 1.  Impaired mobility and ADLs secondary to L caudate ICH  -patient may shower  -ELOS/Goals: 2-3 weeks MinA 2.  Antithrombotics: -DVT/anticoagulation:  Pharmaceutical: Heparin  -antiplatelet therapy: N/A 3. Pain Management: N/A 4. Mood: LCSW to follow for evaluation and support  -antipsychotic agents: N/AA 5. Neuropsych: This patient is not capable of making decisions on his own behalf. 6. Skin/Wound Care:  Routine pressure relief  measures. 7. Fluids/Electrolytes/Nutrition: Monitor I/O.  8. H/o CVA with spastic L- HP: No AC due to ICH/IVH. Statin on hold due to bleed.  9.  HTN: Monitor blood pressures 3 times a day.  SBP goal <160. Continue amlodipine twice daily and Avapro daily. BP controlled.  10.  Schatzki's ring/Dysphagia: Continue dysphagia 1 with thin liquids.  Offer supplements between meals. 11. Vascular dementia: On Aricept and Seroquel at bedtime. 12.  Prediabetes: Hgb A1C- 5.7. Monitor fasting BS with intermittent labs. Relatively well controlled.  13.  A fib: Rate controlled. Will monitor HR tid--off ASA due to recurrent ICH. Approaching 102 on 9/28.  Reesa Chew, PA-C  I have personally performed a face to face diagnostic evaluation, including, but not limited to relevant history and physical exam findings, of this patient and developed relevant assessment and plan.  Additionally, I have reviewed and concur with the physician assistant's documentation above.  The patient's status has not changed. The original post admission physician evaluation remains appropriate, and any changes from the pre-admission screening or documentation from the acute chart are noted above.   Leeroy Cha, MD

## 2020-04-08 NOTE — Progress Notes (Signed)
Inpatient Rehabilitation Admissions Coordinator  I have insurance approval to admit patient to [facility] today. I met with patient and his spouse at bedside. I have alerted acute team and TOC. I will make the arrangements to admit today.  Danne Baxter, RN, MSN Rehab Admissions Coordinator [phone] 04/08/2020 1:37 PM

## 2020-04-08 NOTE — Progress Notes (Signed)
Jack Ribas, MD  Physician  Physical Medicine and Rehabilitation  Consult Note     Signed  Date of Service:  04/04/2020 10:00 AM      Related encounter: ED to Hosp-Admission (Discharged) from 04/02/2020 in [place] 3W Progressive [facility]      Signed      Expand All Collapse All  Show:Clear all [x] Manual[x] Template[] Copied  Added by: [x] Love, Ivan Anchors, PA-C[x] Raulkar, Clide Deutscher, MD  [] Hover for details          Physical Medicine and Rehabilitation Consult     Reason for Consult: [place] Referring Physician: Dr. Erlinda Hong     HPI: Jack [name] is a 86 y.o. male with history of L-MCA infarct with residual L-HP and A fib --no anticoagulation due to prior ICH, vascular dementia 04/02/2020 with low-grade fever and worsening of confusion.   CT head done showing suspicion of hemorrhage from left basal ganglia, left caudate region felt to be hypertensive in nature as well as extensive microvascular changes. He was started on IV antibiotics as well as Cleviprex.   ASA discontinued and bleed felt to be likely CVA in nature. 2D echo showed EF 65 to 70% with mild LVH, moderate TVR and biatrial dilatation.  He developed acute pulmonary edema with shortness of breath 09/23 and was treated with dose of Lasix.  He remains n.p.o. due to combativeness and inability to participate in MBS.   Per reports, at baseline patient was able to ambulate with use of rolling walker.  PT evaluations completed and CIR recommended due to functional decline.     Review of Systems  Unable to perform ROS: Mental acuity          Past Medical History:  Diagnosis Date  . Acute CVA (cerebrovascular accident) ([place]) 07/05/2016  . Atrial fibrillation ([place])      Dr. Tollie Eth  . BPH (benign prostatic hypertrophy) 2012    prior therapy with finasteride, but as of 03/2015 no c/o; Dr. Luanne Bras  . Cellulitis of face 11/08    [facility]  . CVA (cerebral infarction) 2009, 2015  . Echocardiogram abnormal  02/11/2010    LVEF 70%, severe mitral regurgitation, pulm HTN   . ED (erectile dysfunction)      Viagra prn, consult with Urology 2012  . Elevated PSA 2012    BPH; Dr. Luanne Bras, [facility]; no more PSAs needed as of 2012 consult  . Gait abnormality      uses cane, s/p CVA  . Hemiparesis affecting left side as late effect of cerebrovascular accident ([place])    . Hiatal hernia      per 2008 EUS  . Hyperlipidemia    . Hypertension    . Microscopic hematuria      chronic, eval 2012 with Urology, benign at that time  . Obstructive uropathy 2012    Dr. Luanne Bras  . Pancreatic mass 01/2006    inconsequential per CT 2007  . Parapelvic renal cyst 01/2006    CT demonstrated inconsequential cyst  . Patent foramen ovale      hx/o, notation of fistula on echocardiogram from 2009  . Prophylactic antibiotic 2016    requires due to valve disease  . Schatzki's ring 2008    per endoscopic ultrasound; Dr. Janetta Hora. [name]  . Stroke ([place])    . Ventral hernia      surgery consult 01/2010 with Dr. Excell Seltzer, elective repair if desired , but he declined  . Wears hearing  aid        Past Surgical History:  Procedure Laterality Date  . CARDIAC CATHETERIZATION   02/2010    LVEF 70%, severe mitral regurgitation, pulm HTN; prior in 01/2006  . INGUINAL HERNIA REPAIR        bilat  . MITRAL VALVE REPAIR   01/2006    mitral repair with Maze procedure, tricuspid annuloplasty, MAZE and LAA oversewn by Dr. Roxy Manns      Family History  Family history unknown: Yes--unable to elicit due to cognitive deficits.       Social History:  Married. Per reports that he has never smoked. He has never used smokeless tobacco.Per reports he does not drink alcohol and does not use drugs.           Allergies  Allergen Reactions  . Sulfa Antibiotics Swelling      Swelling in groin area          Medications Prior to Admission  Medication Sig Dispense Refill  . amLODipine  (NORVASC) 5 MG tablet Take 1 tablet (5 mg total) by mouth in the morning and at bedtime. No additional refills without office visit (Patient taking differently: Take 5 mg by mouth in the morning and at bedtime. ) 60 tablet 0  . aspirin EC 81 MG tablet Take 1 tablet (81 mg total) by mouth daily.      . Cholecalciferol (VITAMIN D3 PO) Take 1 tablet by mouth daily.       Marland Kitchen donepezil (ARICEPT) 10 MG tablet Take 1 tablet (10 mg total) by mouth at bedtime. 90 tablet 3  . olmesartan (BENICAR) 40 MG tablet Take 1 tablet (40 mg total) by mouth daily. No additional refills without and office visit. (Patient taking differently: Take 40 mg by mouth daily. ) 90 tablet 0  . tamsulosin (FLOMAX) 0.4 MG CAPS capsule Take 0.4 mg by mouth daily.           Home: Home Living Family/patient expects to be discharged to:: Private residence Living Arrangements: Spouse/significant other Available Help at Discharge: Family Type of Home: House Additional Comments: no family present, pt cognitively altered, PT obtaining limited history from chart review of this admission  Functional History: Prior Function Level of Independence: Needs assistance Gait / Transfers Assistance Needed: pt ambulates with a RW, unsure of distance tolerated ADL's / Homemaking Assistance Needed: pt requires assistance for all ADLs Comments: history obtained from provider note this admission as no family present and pt with AMS Functional Status:  Mobility: Bed Mobility Overal bed mobility: Needs Assistance Bed Mobility: Supine to Sit Supine to sit: Min assist General bed mobility comments: minA to pull into sitting and to maintain safety at edge of bed Transfers Overall transfer level: Needs assistance Equipment used: 1 person hand held assist Transfers: Sit to/from Stand, Stand Pivot Transfers Sit to Stand: Min assist Stand pivot transfers: Mod assist General transfer comment: minA sit to stand with posterior lean against bed, modA SPT  due to impaired balance   ADL:   Cognition: Cognition Overall Cognitive Status: No family/caregiver present to determine baseline cognitive functioning Orientation Level: Oriented to place, Disoriented to place, Disoriented to time, Disoriented to situation Cognition Arousal/Alertness: Awake/alert Behavior During Therapy: Restless, Impulsive Overall Cognitive Status: No family/caregiver present to determine baseline cognitive functioning General Comments: pt is oriented to self, but not to person place or time. Pt with poor short term memory and safety awareness. Pt has some awareness of deficits but limited awareness of their implication  on his safety.   Blood pressure 133/66, pulse 85, temperature 98.3 F (36.8 C), temperature source Oral, resp. rate 20, SpO2 98 %. Physical Exam   General: Was lying in bed with bilateral wrist restraints and mittens in place.  HEENT: Head is normocephalic, hard of hearing Heart: Rhythm regularly irregular.  No murmurs rubs or gallops Chest: CTA bilaterally without wheezes, rales, or rhonchi; no distress Abdomen: Soft, non-tender, non-distended, bowel sounds positive. Extremities: No clubbing, cyanosis, or edema. Pulses are 2+ Skin: Dried blood on right ear Neuro: Mild right facial weakness with dysarthric/anarthic speech. Initially confused but once awakened fully, he was oriented to self and place with minimal cues. He moves all four--had difficulty participating in MMT but did make effort.  Musculoskeletal: Full ROM, No pain with AROM or PROM in the neck, trunk, or extremities. Posture appropriate Psych: Pt's affect is appropriate. Pt is cooperative   Lab Results Last 24 Hours       Results for orders placed or performed during the hospital encounter of 04/02/20 (from the past 24 hour(s))  CBC     Status: Abnormal    Collection Time: 04/03/20 11:08 AM  Result Value Ref Range    WBC 11.0 (H) 4.0 - 10.5 K/uL    RBC 4.00 (L) 4.22 - 5.81 MIL/uL     Hemoglobin 11.6 (L) 13.0 - 17.0 g/dL    HCT 36.7 (L) 39 - 52 %    MCV 91.8 80.0 - 100.0 fL    MCH 29.0 26.0 - 34.0 pg    MCHC 31.6 30.0 - 36.0 g/dL    RDW 14.6 11.5 - 15.5 %    Platelets 255 150 - 400 K/uL    nRBC 0.0 0.0 - 0.2 %       Imaging Results (Last 48 hours)  CT HEAD WO CONTRAST   Result Date: 04/04/2020 CLINICAL DATA:  Follow-up examination for intracranial hemorrhage. EXAM: CT HEAD WITHOUT CONTRAST TECHNIQUE: Contiguous axial images were obtained from the base of the skull through the vertex without intravenous contrast. COMPARISON:  Prior CT from 04/02/2020. FINDINGS: Brain: Hemorrhage centered near the left caudate head is slightly decreased in size measuring 2.1 x 1.7 x 1.7 cm on today's exam. Mild surrounding vasogenic edema without significant regional mass effect. Intraventricular extension with blood layering in both lateral ventricles, slightly increased. Overall ventricular size is stable without hydrocephalus or trapping. Trace subarachnoid blood noted at the posterior left temporoparietal region, suspected be related to redistribution. No new hemorrhage. Underlying atrophy with extensive chronic ischemic changes again noted, stable. No acute large vessel territory infarct. No midline shift or extra-axial fluid collection. Vascular: No hyperdense vessel. Scattered vascular calcifications noted within the carotid siphons. Skull: Scalp soft tissues and calvarium unchanged. Sinuses/Orbits: Globes and orbital soft tissues demonstrate no acute finding. Small air-fluid level noted within the left sphenoid sinus. Paranasal sinuses are otherwise clear. No mastoid effusion. Other: None. IMPRESSION: 1. Slight interval decrease in size of left basal ganglia hemorrhage, now measuring 2.1 x 1.7 x 1.7 cm. Intraventricular extension with blood layering in both lateral ventricles, slightly increased. Overall ventricular size is stable without hydrocephalus or trapping. 2. Trace subarachnoid blood  at the posterior left temporoparietal region, suspected to be related to redistribution. No new hemorrhage. 3. No other new acute intracranial abnormality. Electronically Signed   By: Jeannine Boga M.D.   On: 04/04/2020 02:57    CT HEAD WO CONTRAST   Result Date: 04/02/2020 CLINICAL DATA:  Acute neuro deficit.  Intracranial  hemorrhage. EXAM: CT HEAD WITHOUT CONTRAST TECHNIQUE: Contiguous axial images were obtained from the base of the skull through the vertex without intravenous contrast. COMPARISON:  CT head 04/02/2020 FINDINGS: Brain: Hematoma in the left frontal horn is unchanged. This may be arising from the left caudate. Correlate with hypertension. Mild intraventricular hemorrhage in the occipital horns bilaterally unchanged. No new area of hemorrhage. Generalized atrophy. Negative for hydrocephalus. Extensive white matter changes most likely due to chronic microvascular ischemia. Chronic infarct left cerebellum. Vascular: Negative for hyperdense vessel Skull: Negative Sinuses/Orbits: Paranasal sinuses clear.  Negative orbit Other: None IMPRESSION: Atrial ventricular hemorrhage is stable. No hydrocephalus. Suspect hemorrhage origin from the left basal ganglia/ left caudate region. Probable hypertensive hemorrhage Extensive chronic microvascular ischemic changes in the white matter. Electronically Signed   By: Franchot Gallo M.D.   On: 04/02/2020 18:04    CT HEAD WO CONTRAST   Result Date: 04/02/2020 CLINICAL DATA:  Altered mental status. EXAM: CT HEAD WITHOUT CONTRAST TECHNIQUE: Contiguous axial images were obtained from the base of the skull through the vertex without intravenous contrast. COMPARISON:  December 27, 2018. FINDINGS: Brain: Moderate chronic ischemic white matter disease is noted. Mild diffuse cortical atrophy is noted. Large amount hemorrhage is noted in the left frontal horn as well as small amount of hemorrhage seen in both posterior horns. Potentially the hemorrhage may be  originating in the left thalamus. No mass effect or midline shift is noted. No definite evidence of mass lesion or acute infarction is noted. Vascular: No hyperdense vessel or unexpected calcification. Skull: Normal. Negative for fracture or focal lesion. Sinuses/Orbits: No acute finding. Other: None. IMPRESSION: Intraventricular hemorrhage is noted bilaterally, but most prominently seen in the left lateral ventricle, and potentially may be originating from the left thalamus. No mass effect or midline shift is noted. Moderate chronic ischemic white matter disease is noted. Mild diffuse cortical atrophy is noted. Critical Value/emergent results were called by telephone at the time of interpretation on 04/02/2020 at 10:41 am to provider Sherwood Gambler , who verbally acknowledged these results. Electronically Signed   By: Marijo Conception M.D.   On: 04/02/2020 10:41    DG CHEST PORT 1 VIEW   Result Date: 04/04/2020 CLINICAL DATA:  Pulmonary edema.  Wheezing. EXAM: PORTABLE CHEST 1 VIEW COMPARISON:  04/03/2020. FINDINGS: Prior CABG and cardiac valve replacement. Stable borderline cardiomegaly. Mild pulmonary venous congestion. Low lung volumes with mild left base subsegmental atelectasis. No pleural effusion. No pneumothorax. IMPRESSION: 1. Prior CABG and cardiac valve replacement. Stable borderline cardiomegaly. Mild pulmonary venous congestion. 2.  Low lung volumes.  Mild left base subsegment atelectasis. Electronically Signed   By: Marcello Moores  Register   On: 04/04/2020 07:27    DG CHEST PORT 1 VIEW   Result Date: 04/03/2020 CLINICAL DATA:  Pulmonary edema. EXAM: PORTABLE CHEST 1 VIEW COMPARISON:  04/02/2020. FINDINGS: Prior CABG and cardiac valve replacements. Cardiomegaly. Mild pulmonary venous congestion cannot be excluded. Low lung volumes with mild bibasilar atelectasis. Small left pleural effusion cannot be excluded. No pneumothorax. IMPRESSION: 1. Prior CABG and cardiac valve replacements. Cardiomegaly. Mild  pulmonary venous congestion cannot be excluded. 2. Low lung volumes with mild bibasilar atelectasis. Small left pleural effusion cannot be excluded. Electronically Signed   By: Marcello Moores  Register   On: 04/03/2020 12:33    DG Chest Portable 1 View   Result Date: 04/02/2020 CLINICAL DATA:  Altered mental status EXAM: PORTABLE CHEST 1 VIEW COMPARISON:  02/23/2019 FINDINGS: Prior median sternotomy and valve replacement. Heart  is normal size. No confluent opacities or effusions. No acute bony abnormality. IMPRESSION: No active disease. Electronically Signed   By: Rolm Baptise M.D.   On: 04/02/2020 10:37    ECHOCARDIOGRAM COMPLETE   Result Date: 04/02/2020    ECHOCARDIOGRAM REPORT   Patient Name:   Jack [name] Date of Exam: 04/02/2020 Medical Rec #:  [id number]     Height:       61.0 in Accession #:    [id number]    Weight:       144.0 lb Date of Birth:  08/19/1933      BSA:          1.643 m Patient Age:    38 years      BP:           130/82 mmHg Patient Gender: M             HR:           91 bpm. Exam Location:  Inpatient Procedure: 2D Echo Indications:    Stroke 434.91 / I163.9  History:        Patient has prior history of Echocardiogram examinations, most                 recent 07/17/2019. Prior CABG, Stroke, Arrythmias:Atrial                 Fibrillation; Risk Factors:Non-Smoker, Dyslipidemia and                 Hypertension. PFO, mitral repair with Maze procedure, tricuspid                 annuloplasty, MAZE and LAA oversewn by Dr. Roxy Manns 2007.                  Mitral Valve: prosthetic annuloplasty ring valve is present in                 the mitral position. Procedure Date: 2007.  Sonographer:    Leavy Cella Referring Phys: [id number] ASHISH ARORA IMPRESSIONS  1. Left ventricular ejection fraction, by estimation, is 65 to 70%. The left ventricle has normal function. The left ventricle has no regional wall motion abnormalities. There is mild left ventricular hypertrophy. Left ventricular diastolic function could  not be evaluated.  2. Right ventricular systolic function was not well visualized. The right ventricular size is normal. There is normal pulmonary artery systolic pressure.  3. Left atrial size was moderately dilated.  4. Right atrial size was mild to moderately dilated.  5. The mitral valve has been repaired/replaced. Trivial mitral valve regurgitation. No evidence of mitral stenosis. There is a prosthetic annuloplasty ring present in the mitral position. Procedure Date: 2007.  6. Eccentric TR jet, best seen on image 22 and not well seen on other images. Jet is eccentric and likely moderate, but full jet width not well visualized. Cannot determine if regurgitation is inside or outside TV ring.. The tricuspid valve is has been repaired/replaced. The tricuspid valve is status post repair with an annuloplasty ring. Tricuspid valve regurgitation is moderate.  7. The aortic valve is tricuspid. There is mild calcification of the aortic valve. There is mild thickening of the aortic valve. Aortic valve regurgitation is not visualized. Mild aortic valve sclerosis is present, with no evidence of aortic valve stenosis.  8. The inferior vena cava is normal in size with greater than 50% respiratory variability, suggesting right atrial pressure of 3 mmHg. Comparison(s): No significant change  from prior study. Conclusion(s)/Recommendation(s): No intracardiac source of embolism detected on this transthoracic study. A transesophageal echocardiogram is recommended to exclude cardiac source of embolism if clinically indicated. FINDINGS  Left Ventricle: Left ventricular ejection fraction, by estimation, is 65 to 70%. The left ventricle has normal function. The left ventricle has no regional wall motion abnormalities. The left ventricular internal cavity size was normal in size. There is  mild left ventricular hypertrophy. Left ventricular diastolic function could not be evaluated due to mitral valve repair. Left ventricular diastolic  function could not be evaluated. Right Ventricle: The right ventricular size is normal. Right vetricular wall thickness was not well visualized. Right ventricular systolic function was not well visualized. There is normal pulmonary artery systolic pressure. The tricuspid regurgitant velocity is 1.97 m/s, and with an assumed right atrial pressure of 3 mmHg, the estimated right ventricular systolic pressure is 79.0 mmHg. Left Atrium: Left atrial size was moderately dilated. Right Atrium: Right atrial size was mild to moderately dilated. Pericardium: Trivial pericardial effusion is present. Mitral Valve: The mitral valve has been repaired/replaced. Trivial mitral valve regurgitation. There is a prosthetic annuloplasty ring present in the mitral position. Procedure Date: 2007. No evidence of mitral valve stenosis. Tricuspid Valve: Eccentric TR jet, best seen on image 22 and not well seen on other images. Jet is eccentric and likely moderate, but full jet width not well visualized. Cannot determine if regurgitation is inside or outside TV ring. The tricuspid valve is has been repaired/replaced. Tricuspid valve regurgitation is moderate. The tricuspid valve is status post repair with an annuloplasty ring. Aortic Valve: The aortic valve is tricuspid. There is mild calcification of the aortic valve. There is mild thickening of the aortic valve. There is mild to moderate aortic valve annular calcification. Aortic valve regurgitation is not visualized. Mild aortic valve sclerosis is present, with no evidence of aortic valve stenosis. Pulmonic Valve: The pulmonic valve was not well visualized. Pulmonic valve regurgitation is mild. Aorta: The aortic root, ascending aorta and aortic arch are all structurally normal, with no evidence of dilitation or obstruction. Venous: The inferior vena cava is normal in size with greater than 50% respiratory variability, suggesting right atrial pressure of 3 mmHg. IAS/Shunts: The interatrial  septum appears to be lipomatous. No atrial level shunt detected by color flow Doppler.  LEFT VENTRICLE PLAX 2D LVIDd:         3.25 cm  Diastology LVIDs:         2.10 cm  LV e' lateral:   8.49 cm/s LV PW:         1.55 cm  LV E/e' lateral: 15.1 LV IVS:        1.15 cm LVOT diam:     2.00 cm LVOT Area:     3.14 cm  LEFT ATRIUM             Index       RIGHT ATRIUM           Index LA diam:        4.40 cm 2.68 cm/m  RA Area:     16.90 cm LA Vol (A2C):   51.9 ml 31.60 ml/m RA Volume:   42.70 ml  25.99 ml/m LA Vol (A4C):   75.2 ml 45.78 ml/m LA Biplane Vol: 62.5 ml 38.05 ml/m   AORTA Ao Root diam: 2.90 cm MITRAL VALVE                TRICUSPID VALVE MV Area (PHT): 3.42  cm     TR Peak grad:   15.5 mmHg MV Decel Time: 222 msec     TR Vmax:        197.00 cm/s MV E velocity: 128.00 cm/s                             SHUNTS                             Systemic Diam: 2.00 cm Buford Dresser MD Electronically signed by Buford Dresser MD Signature Date/Time: 04/02/2020/2:40:11 PM    Final          Assessment/Plan: Diagnosis: ICH 1. Does the need for close, 24 hr/day medical supervision in concert with the patient's rehab needs make it unreasonable for this patient to be served in a less intensive setting? Yes 2. Co-Morbidities requiring supervision/potential complications: PVCs, cyst and pseudocyst of pancreas, history of renal cyst, afib, s/p mitral valve repair, HTN, left upper extremity paresthesia 3. Due to bladder management, bowel management, safety, skin/wound care, disease management, medication administration, pain management and patient education, does the patient require 24 hr/day rehab nursing? Yes 4. Does the patient require coordinated care of a physician, rehab nurse, therapy disciplines of PT, OT, SLP to address physical and functional deficits in the context of the above medical diagnosis(es)? Yes Addressing deficits in the following areas: balance, endurance, locomotion, strength,  transferring, bowel/bladder control, bathing, dressing, feeding, grooming, toileting, cognition, speech, language and psychosocial support 5. Can the patient actively participate in an intensive therapy program of at least 3 hrs of therapy per day at least 5 days per week? Yes 6. The potential for patient to make measurable gains while on inpatient rehab is excellent 7. Anticipated functional outcomes upon discharge from inpatient rehab are min assist  with PT, min assist with OT, min assist with SLP. 8. Estimated rehab length of stay to reach the above functional goals is: 2-3 weeks 9. Anticipated discharge destination: Home 10. Overall Rehab/Functional Prognosis: excellent and good   RECOMMENDATIONS: This patient's condition is appropriate for continued rehabilitative care in the following setting: CIR Patient has agreed to participate in recommended program. Yes and N/A Note that insurance prior authorization may be required for reimbursement for recommended care.   Comment: Mr. Hettich would be a good CIR candidate if 24/7 support can be confirmed. Thank you for this consult. Admission coordinator to follow.    I have personally performed a face to face diagnostic evaluation, including, but not limited to relevant history and physical exam findings, of this patient and developed relevant assessment and plan.  Additionally, I have reviewed and concur with the physician assistant's documentation above.   Leeroy Cha, MD   Bary Leriche, PA-C 04/04/2020        Revision History                          Routing History                Note Details  Author Jack Ribas, MD File Time 04/04/2020 10:04 AM  Author Type Physician Status Signed  Last Editor Jack Ribas, MD Service Physical Medicine and Rehabilitation   Raulkar, Clide Deutscher, MD  Physician  Physical Medicine and Rehabilitation  Consult Note     Signed  Date of Service:  04/04/2020 10:00 AM  Related  encounter: ED to Hosp-Admission (Discharged) from 04/02/2020 in [place] 3W Progressive Care      Signed      Expand All Collapse All  Show:Clear all [x] Manual[x] Template[] Copied  Added by: [x] Love, Ivan Anchors, PA-C[x] Raulkar, Clide Deutscher, MD  [] Hover for details          Physical Medicine and Rehabilitation Consult     Reason for Consult: [place] Referring Physician: Dr. Erlinda Hong     HPI: Jack [name] is a 86 y.o. male with history of L-MCA infarct with residual L-HP and A fib --no anticoagulation due to prior ICH, vascular dementia 04/02/2020 with low-grade fever and worsening of confusion.   CT head done showing suspicion of hemorrhage from left basal ganglia, left caudate region felt to be hypertensive in nature as well as extensive microvascular changes. He was started on IV antibiotics as well as Cleviprex.   ASA discontinued and bleed felt to be likely CVA in nature. 2D echo showed EF 65 to 70% with mild LVH, moderate TVR and biatrial dilatation.  He developed acute pulmonary edema with shortness of breath 09/23 and was treated with dose of Lasix.  He remains n.p.o. due to combativeness and inability to participate in MBS.   Per reports, at baseline patient was able to ambulate with use of rolling walker.  PT evaluations completed and CIR recommended due to functional decline.     Review of Systems  Unable to perform ROS: Mental acuity          Past Medical History:  Diagnosis Date  . Acute CVA (cerebrovascular accident) ([place]) 07/05/2016  . Atrial fibrillation ([place])      Dr. Tollie Eth  . BPH (benign prostatic hypertrophy) 2012    prior therapy with finasteride, but as of 03/2015 no c/o; Dr. Luanne Bras  . Cellulitis of face 11/08    [facility]  . CVA (cerebral infarction) 2009, 2015  . Echocardiogram abnormal 02/11/2010    LVEF 70%, severe mitral regurgitation, pulm HTN   . ED (erectile dysfunction)      Viagra prn, consult with Urology 2012  . Elevated PSA 2012     BPH; Dr. Luanne Bras, [facility]; no more PSAs needed as of 2012 consult  . Gait abnormality      uses cane, s/p CVA  . Hemiparesis affecting left side as late effect of cerebrovascular accident ([place])    . Hiatal hernia      per 2008 EUS  . Hyperlipidemia    . Hypertension    . Microscopic hematuria      chronic, eval 2012 with Urology, benign at that time  . Obstructive uropathy 2012    Dr. Luanne Bras  . Pancreatic mass 01/2006    inconsequential per CT 2007  . Parapelvic renal cyst 01/2006    CT demonstrated inconsequential cyst  . Patent foramen ovale      hx/o, notation of fistula on echocardiogram from 2009  . Prophylactic antibiotic 2016    requires due to valve disease  . Schatzki's ring 2008    per endoscopic ultrasound; Dr. Janetta Hora. [name]  . Stroke ([place])    . Ventral hernia      surgery consult 01/2010 with Dr. Excell Seltzer, elective repair if desired , but he declined  . Wears hearing aid        Past Surgical History:  Procedure Laterality Date  . CARDIAC CATHETERIZATION   02/2010    LVEF 70%, severe mitral regurgitation, pulm  HTN; prior in 01/2006  . INGUINAL HERNIA REPAIR        bilat  . MITRAL VALVE REPAIR   01/2006    mitral repair with Maze procedure, tricuspid annuloplasty, MAZE and LAA oversewn by Dr. Roxy Manns      Family History  Family history unknown: Yes--unable to elicit due to cognitive deficits.       Social History:  Married. Per reports that he has never smoked. He has never used smokeless tobacco.Per reports he does not drink alcohol and does not use drugs.           Allergies  Allergen Reactions  . Sulfa Antibiotics Swelling      Swelling in groin area          Medications Prior to Admission  Medication Sig Dispense Refill  . amLODipine (NORVASC) 5 MG tablet Take 1 tablet (5 mg total) by mouth in the morning and at bedtime. No additional refills without office visit (Patient taking differently: Take  5 mg by mouth in the morning and at bedtime. ) 60 tablet 0  . aspirin EC 81 MG tablet Take 1 tablet (81 mg total) by mouth daily.      . Cholecalciferol (VITAMIN D3 PO) Take 1 tablet by mouth daily.       Marland Kitchen donepezil (ARICEPT) 10 MG tablet Take 1 tablet (10 mg total) by mouth at bedtime. 90 tablet 3  . olmesartan (BENICAR) 40 MG tablet Take 1 tablet (40 mg total) by mouth daily. No additional refills without and office visit. (Patient taking differently: Take 40 mg by mouth daily. ) 90 tablet 0  . tamsulosin (FLOMAX) 0.4 MG CAPS capsule Take 0.4 mg by mouth daily.           Home: Home Living Family/patient expects to be discharged to:: Private residence Living Arrangements: Spouse/significant other Available Help at Discharge: Family Type of Home: House Additional Comments: no family present, pt cognitively altered, PT obtaining limited history from chart review of this admission  Functional History: Prior Function Level of Independence: Needs assistance Gait / Transfers Assistance Needed: pt ambulates with a RW, unsure of distance tolerated ADL's / Homemaking Assistance Needed: pt requires assistance for all ADLs Comments: history obtained from provider note this admission as no family present and pt with AMS Functional Status:  Mobility: Bed Mobility Overal bed mobility: Needs Assistance Bed Mobility: Supine to Sit Supine to sit: Min assist General bed mobility comments: minA to pull into sitting and to maintain safety at edge of bed Transfers Overall transfer level: Needs assistance Equipment used: 1 person hand held assist Transfers: Sit to/from Stand, Stand Pivot Transfers Sit to Stand: Min assist Stand pivot transfers: Mod assist General transfer comment: minA sit to stand with posterior lean against bed, modA SPT due to impaired balance   ADL:   Cognition: Cognition Overall Cognitive Status: No family/caregiver present to determine baseline cognitive  functioning Orientation Level: Oriented to place, Disoriented to place, Disoriented to time, Disoriented to situation Cognition Arousal/Alertness: Awake/alert Behavior During Therapy: Restless, Impulsive Overall Cognitive Status: No family/caregiver present to determine baseline cognitive functioning General Comments: pt is oriented to self, but not to person place or time. Pt with poor short term memory and safety awareness. Pt has some awareness of deficits but limited awareness of their implication on his safety.   Blood pressure 133/66, pulse 85, temperature 98.3 F (36.8 C), temperature source Oral, resp. rate 20, SpO2 98 %. Physical Exam   General: Was lying  in bed with bilateral wrist restraints and mittens in place.  HEENT: Head is normocephalic, hard of hearing Heart: Rhythm regularly irregular.  No murmurs rubs or gallops Chest: CTA bilaterally without wheezes, rales, or rhonchi; no distress Abdomen: Soft, non-tender, non-distended, bowel sounds positive. Extremities: No clubbing, cyanosis, or edema. Pulses are 2+ Skin: Dried blood on right ear Neuro: Mild right facial weakness with dysarthric/anarthic speech. Initially confused but once awakened fully, he was oriented to self and place with minimal cues. He moves all four--had difficulty participating in MMT but did make effort.  Musculoskeletal: Full ROM, No pain with AROM or PROM in the neck, trunk, or extremities. Posture appropriate Psych: Pt's affect is appropriate. Pt is cooperative   Lab Results Last 24 Hours       Results for orders placed or performed during the hospital encounter of 04/02/20 (from the past 24 hour(s))  CBC     Status: Abnormal    Collection Time: 04/03/20 11:08 AM  Result Value Ref Range    WBC 11.0 (H) 4.0 - 10.5 K/uL    RBC 4.00 (L) 4.22 - 5.81 MIL/uL    Hemoglobin 11.6 (L) 13.0 - 17.0 g/dL    HCT 36.7 (L) 39 - 52 %    MCV 91.8 80.0 - 100.0 fL    MCH 29.0 26.0 - 34.0 pg    MCHC 31.6 30.0 -  36.0 g/dL    RDW 14.6 11.5 - 15.5 %    Platelets 255 150 - 400 K/uL    nRBC 0.0 0.0 - 0.2 %       Imaging Results (Last 48 hours)  CT HEAD WO CONTRAST   Result Date: 04/04/2020 CLINICAL DATA:  Follow-up examination for intracranial hemorrhage. EXAM: CT HEAD WITHOUT CONTRAST TECHNIQUE: Contiguous axial images were obtained from the base of the skull through the vertex without intravenous contrast. COMPARISON:  Prior CT from 04/02/2020. FINDINGS: Brain: Hemorrhage centered near the left caudate head is slightly decreased in size measuring 2.1 x 1.7 x 1.7 cm on today's exam. Mild surrounding vasogenic edema without significant regional mass effect. Intraventricular extension with blood layering in both lateral ventricles, slightly increased. Overall ventricular size is stable without hydrocephalus or trapping. Trace subarachnoid blood noted at the posterior left temporoparietal region, suspected be related to redistribution. No new hemorrhage. Underlying atrophy with extensive chronic ischemic changes again noted, stable. No acute large vessel territory infarct. No midline shift or extra-axial fluid collection. Vascular: No hyperdense vessel. Scattered vascular calcifications noted within the carotid siphons. Skull: Scalp soft tissues and calvarium unchanged. Sinuses/Orbits: Globes and orbital soft tissues demonstrate no acute finding. Small air-fluid level noted within the left sphenoid sinus. Paranasal sinuses are otherwise clear. No mastoid effusion. Other: None. IMPRESSION: 1. Slight interval decrease in size of left basal ganglia hemorrhage, now measuring 2.1 x 1.7 x 1.7 cm. Intraventricular extension with blood layering in both lateral ventricles, slightly increased. Overall ventricular size is stable without hydrocephalus or trapping. 2. Trace subarachnoid blood at the posterior left temporoparietal region, suspected to be related to redistribution. No new hemorrhage. 3. No other new acute intracranial  abnormality. Electronically Signed   By: Jeannine Boga M.D.   On: 04/04/2020 02:57    CT HEAD WO CONTRAST   Result Date: 04/02/2020 CLINICAL DATA:  Acute neuro deficit.  Intracranial hemorrhage. EXAM: CT HEAD WITHOUT CONTRAST TECHNIQUE: Contiguous axial images were obtained from the base of the skull through the vertex without intravenous contrast. COMPARISON:  CT head 04/02/2020 FINDINGS: Brain:  Hematoma in the left frontal horn is unchanged. This may be arising from the left caudate. Correlate with hypertension. Mild intraventricular hemorrhage in the occipital horns bilaterally unchanged. No new area of hemorrhage. Generalized atrophy. Negative for hydrocephalus. Extensive white matter changes most likely due to chronic microvascular ischemia. Chronic infarct left cerebellum. Vascular: Negative for hyperdense vessel Skull: Negative Sinuses/Orbits: Paranasal sinuses clear.  Negative orbit Other: None IMPRESSION: Atrial ventricular hemorrhage is stable. No hydrocephalus. Suspect hemorrhage origin from the left basal ganglia/ left caudate region. Probable hypertensive hemorrhage Extensive chronic microvascular ischemic changes in the white matter. Electronically Signed   By: Franchot Gallo M.D.   On: 04/02/2020 18:04    CT HEAD WO CONTRAST   Result Date: 04/02/2020 CLINICAL DATA:  Altered mental status. EXAM: CT HEAD WITHOUT CONTRAST TECHNIQUE: Contiguous axial images were obtained from the base of the skull through the vertex without intravenous contrast. COMPARISON:  December 27, 2018. FINDINGS: Brain: Moderate chronic ischemic white matter disease is noted. Mild diffuse cortical atrophy is noted. Large amount hemorrhage is noted in the left frontal horn as well as small amount of hemorrhage seen in both posterior horns. Potentially the hemorrhage may be originating in the left thalamus. No mass effect or midline shift is noted. No definite evidence of mass lesion or acute infarction is noted. Vascular:  No hyperdense vessel or unexpected calcification. Skull: Normal. Negative for fracture or focal lesion. Sinuses/Orbits: No acute finding. Other: None. IMPRESSION: Intraventricular hemorrhage is noted bilaterally, but most prominently seen in the left lateral ventricle, and potentially may be originating from the left thalamus. No mass effect or midline shift is noted. Moderate chronic ischemic white matter disease is noted. Mild diffuse cortical atrophy is noted. Critical Value/emergent results were called by telephone at the time of interpretation on 04/02/2020 at 10:41 am to provider Sherwood Gambler , who verbally acknowledged these results. Electronically Signed   By: Marijo Conception M.D.   On: 04/02/2020 10:41    DG CHEST PORT 1 VIEW   Result Date: 04/04/2020 CLINICAL DATA:  Pulmonary edema.  Wheezing. EXAM: PORTABLE CHEST 1 VIEW COMPARISON:  04/03/2020. FINDINGS: Prior CABG and cardiac valve replacement. Stable borderline cardiomegaly. Mild pulmonary venous congestion. Low lung volumes with mild left base subsegmental atelectasis. No pleural effusion. No pneumothorax. IMPRESSION: 1. Prior CABG and cardiac valve replacement. Stable borderline cardiomegaly. Mild pulmonary venous congestion. 2.  Low lung volumes.  Mild left base subsegment atelectasis. Electronically Signed   By: Marcello Moores  Register   On: 04/04/2020 07:27    DG CHEST PORT 1 VIEW   Result Date: 04/03/2020 CLINICAL DATA:  Pulmonary edema. EXAM: PORTABLE CHEST 1 VIEW COMPARISON:  04/02/2020. FINDINGS: Prior CABG and cardiac valve replacements. Cardiomegaly. Mild pulmonary venous congestion cannot be excluded. Low lung volumes with mild bibasilar atelectasis. Small left pleural effusion cannot be excluded. No pneumothorax. IMPRESSION: 1. Prior CABG and cardiac valve replacements. Cardiomegaly. Mild pulmonary venous congestion cannot be excluded. 2. Low lung volumes with mild bibasilar atelectasis. Small left pleural effusion cannot be excluded.  Electronically Signed   By: Marcello Moores  Register   On: 04/03/2020 12:33    DG Chest Portable 1 View   Result Date: 04/02/2020 CLINICAL DATA:  Altered mental status EXAM: PORTABLE CHEST 1 VIEW COMPARISON:  02/23/2019 FINDINGS: Prior median sternotomy and valve replacement. Heart is normal size. No confluent opacities or effusions. No acute bony abnormality. IMPRESSION: No active disease. Electronically Signed   By: Rolm Baptise M.D.   On: 04/02/2020 10:37  ECHOCARDIOGRAM COMPLETE   Result Date: 04/02/2020    ECHOCARDIOGRAM REPORT   Patient Name:   SAAJAN WILLMON Date of Exam: 04/02/2020 Medical Rec #:  [id number]     Height:       61.0 in Accession #:    [id number]    Weight:       144.0 lb Date of Birth:  Jun 22, 1934      BSA:          1.643 m Patient Age:    39 years      BP:           130/82 mmHg Patient Gender: M             HR:           91 bpm. Exam Location:  Inpatient Procedure: 2D Echo Indications:    Stroke 434.91 / I163.9  History:        Patient has prior history of Echocardiogram examinations, most                 recent 07/17/2019. Prior CABG, Stroke, Arrythmias:Atrial                 Fibrillation; Risk Factors:Non-Smoker, Dyslipidemia and                 Hypertension. PFO, mitral repair with Maze procedure, tricuspid                 annuloplasty, MAZE and LAA oversewn by Dr. Roxy Manns 2007.                  Mitral Valve: prosthetic annuloplasty ring valve is present in                 the mitral position. Procedure Date: 2007.  Sonographer:    Leavy Cella Referring Phys: [id number] ASHISH ARORA IMPRESSIONS  1. Left ventricular ejection fraction, by estimation, is 65 to 70%. The left ventricle has normal function. The left ventricle has no regional wall motion abnormalities. There is mild left ventricular hypertrophy. Left ventricular diastolic function could not be evaluated.  2. Right ventricular systolic function was not well visualized. The right ventricular size is normal. There is normal pulmonary  artery systolic pressure.  3. Left atrial size was moderately dilated.  4. Right atrial size was mild to moderately dilated.  5. The mitral valve has been repaired/replaced. Trivial mitral valve regurgitation. No evidence of mitral stenosis. There is a prosthetic annuloplasty ring present in the mitral position. Procedure Date: 2007.  6. Eccentric TR jet, best seen on image 22 and not well seen on other images. Jet is eccentric and likely moderate, but full jet width not well visualized. Cannot determine if regurgitation is inside or outside TV ring.. The tricuspid valve is has been repaired/replaced. The tricuspid valve is status post repair with an annuloplasty ring. Tricuspid valve regurgitation is moderate.  7. The aortic valve is tricuspid. There is mild calcification of the aortic valve. There is mild thickening of the aortic valve. Aortic valve regurgitation is not visualized. Mild aortic valve sclerosis is present, with no evidence of aortic valve stenosis.  8. The inferior vena cava is normal in size with greater than 50% respiratory variability, suggesting right atrial pressure of 3 mmHg. Comparison(s): No significant change from prior study. Conclusion(s)/Recommendation(s): No intracardiac source of embolism detected on this transthoracic study. A transesophageal echocardiogram is recommended to exclude cardiac source of embolism if clinically indicated. FINDINGS  Left Ventricle: Left  ventricular ejection fraction, by estimation, is 65 to 70%. The left ventricle has normal function. The left ventricle has no regional wall motion abnormalities. The left ventricular internal cavity size was normal in size. There is  mild left ventricular hypertrophy. Left ventricular diastolic function could not be evaluated due to mitral valve repair. Left ventricular diastolic function could not be evaluated. Right Ventricle: The right ventricular size is normal. Right vetricular wall thickness was not well visualized.  Right ventricular systolic function was not well visualized. There is normal pulmonary artery systolic pressure. The tricuspid regurgitant velocity is 1.97 m/s, and with an assumed right atrial pressure of 3 mmHg, the estimated right ventricular systolic pressure is 40.9 mmHg. Left Atrium: Left atrial size was moderately dilated. Right Atrium: Right atrial size was mild to moderately dilated. Pericardium: Trivial pericardial effusion is present. Mitral Valve: The mitral valve has been repaired/replaced. Trivial mitral valve regurgitation. There is a prosthetic annuloplasty ring present in the mitral position. Procedure Date: 2007. No evidence of mitral valve stenosis. Tricuspid Valve: Eccentric TR jet, best seen on image 22 and not well seen on other images. Jet is eccentric and likely moderate, but full jet width not well visualized. Cannot determine if regurgitation is inside or outside TV ring. The tricuspid valve is has been repaired/replaced. Tricuspid valve regurgitation is moderate. The tricuspid valve is status post repair with an annuloplasty ring. Aortic Valve: The aortic valve is tricuspid. There is mild calcification of the aortic valve. There is mild thickening of the aortic valve. There is mild to moderate aortic valve annular calcification. Aortic valve regurgitation is not visualized. Mild aortic valve sclerosis is present, with no evidence of aortic valve stenosis. Pulmonic Valve: The pulmonic valve was not well visualized. Pulmonic valve regurgitation is mild. Aorta: The aortic root, ascending aorta and aortic arch are all structurally normal, with no evidence of dilitation or obstruction. Venous: The inferior vena cava is normal in size with greater than 50% respiratory variability, suggesting right atrial pressure of 3 mmHg. IAS/Shunts: The interatrial septum appears to be lipomatous. No atrial level shunt detected by color flow Doppler.  LEFT VENTRICLE PLAX 2D LVIDd:         3.25 cm  Diastology  LVIDs:         2.10 cm  LV e' lateral:   8.49 cm/s LV PW:         1.55 cm  LV E/e' lateral: 15.1 LV IVS:        1.15 cm LVOT diam:     2.00 cm LVOT Area:     3.14 cm  LEFT ATRIUM             Index       RIGHT ATRIUM           Index LA diam:        4.40 cm 2.68 cm/m  RA Area:     16.90 cm LA Vol (A2C):   51.9 ml 31.60 ml/m RA Volume:   42.70 ml  25.99 ml/m LA Vol (A4C):   75.2 ml 45.78 ml/m LA Biplane Vol: 62.5 ml 38.05 ml/m   AORTA Ao Root diam: 2.90 cm MITRAL VALVE                TRICUSPID VALVE MV Area (PHT): 3.42 cm     TR Peak grad:   15.5 mmHg MV Decel Time: 222 msec     TR Vmax:        197.00 cm/s MV  E velocity: 128.00 cm/s                             SHUNTS                             Systemic Diam: 2.00 cm Buford Dresser MD Electronically signed by Buford Dresser MD Signature Date/Time: 04/02/2020/2:40:11 PM    Final          Assessment/Plan: Diagnosis: ICH 11. Does the need for close, 24 hr/day medical supervision in concert with the patient's rehab needs make it unreasonable for this patient to be served in a less intensive setting? Yes 12. Co-Morbidities requiring supervision/potential complications: PVCs, cyst and pseudocyst of pancreas, history of renal cyst, afib, s/p mitral valve repair, HTN, left upper extremity paresthesia 13. Due to bladder management, bowel management, safety, skin/wound care, disease management, medication administration, pain management and patient education, does the patient require 24 hr/day rehab nursing? Yes 14. Does the patient require coordinated care of a physician, rehab nurse, therapy disciplines of PT, OT, SLP to address physical and functional deficits in the context of the above medical diagnosis(es)? Yes Addressing deficits in the following areas: balance, endurance, locomotion, strength, transferring, bowel/bladder control, bathing, dressing, feeding, grooming, toileting, cognition, speech, language and psychosocial support 15. Can  the patient actively participate in an intensive therapy program of at least 3 hrs of therapy per day at least 5 days per week? Yes 16. The potential for patient to make measurable gains while on inpatient rehab is excellent 17. Anticipated functional outcomes upon discharge from inpatient rehab are min assist  with PT, min assist with OT, min assist with SLP. 18. Estimated rehab length of stay to reach the above functional goals is: 2-3 weeks 19. Anticipated discharge destination: Home 20. Overall Rehab/Functional Prognosis: excellent and good   RECOMMENDATIONS: This patient's condition is appropriate for continued rehabilitative care in the following setting: CIR Patient has agreed to participate in recommended program. Yes and N/A Note that insurance prior authorization may be required for reimbursement for recommended care.   Comment: Mr. Lindstrom would be a good CIR candidate if 24/7 support can be confirmed. Thank you for this consult. Admission coordinator to follow.    I have personally performed a face to face diagnostic evaluation, including, but not limited to relevant history and physical exam findings, of this patient and developed relevant assessment and plan.  Additionally, I have reviewed and concur with the physician assistant's documentation above.   Leeroy Cha, MD   Bary Leriche, PA-C 04/04/2020        Revision History                          Routing History                Note Details  Author Jack Ribas, MD File Time 04/04/2020 10:04 AM  Author Type Physician Status Signed  Last Editor Jack Ribas, MD Service Physical Medicine and Rehabilitation

## 2020-04-08 NOTE — Progress Notes (Signed)
Physical Therapy Treatment Patient Details Name: Jack [name] MRN: [id number] DOB: 1933-10-21 Today's Date: 04/08/2020    History of Present Illness 86 year old man with a prior history of atrial fibrillation not on anticoagulation due to [place], prior ischemic stroke in the left MCA territory December 2017, prior ICH-right thalamic/capsular -with residual left-sided weakness since 2015, vascular dementia with last MMSE 15/30 in May 2021 requiring a walker to walk at baseline and requires 24-hour assistance with ADLs, presented to the emergency room for evaluation of worsening confusion.Found to have L caudate head ICH w/ IVH likely due to small vessel disease with HTN    PT Comments    Pt was awoken from a heavy nap and this likely contributed to pt's need for extra assist today.  Pt generally at a max assist level of 1 to 2 persons.  Emphasis on transition to EOB, sit to stand, progressing gait stability and pattern.      Follow Up Recommendations  CIR     Equipment Recommendations  Rolling walker with 5" wheels (TBA)    Recommendations for Other Services Rehab consult     Precautions / Restrictions Precautions Precautions: Fall    Mobility  Bed Mobility Overal bed mobility: Needs Assistance Bed Mobility: Supine to Sit;Sit to Supine     Supine to sit: Total assist;+2 for safety/equipment;+2 for physical assistance Sit to supine: Max assist;+2 for physical assistance   General bed mobility comments: cues for direction.  assist at trunk and LE's use of pad to assist pivot.  Transfers Overall transfer level: Needs assistance Equipment used: Rolling walker (2 wheeled) Transfers: Sit to/from Stand Sit to Stand: Max assist;+2 safety/equipment            Ambulation/Gait Ambulation/Gait assistance: Max assist;+2 physical assistance Gait Distance (Feet): 5 Feet (then additional 10 feet after using the [facility]) Assistive device: Rolling walker (2 wheeled) Gait  Pattern/deviations: Step-to pattern;Step-through pattern Gait velocity: Decreased Gait velocity interpretation: <1.31 ft/sec, indicative of household ambulator General Gait Details: mildly ataxic and significant incoordination with R LE.  Short shuffled R step, not accepting full weight.  2nd person assisting with maneuver of the RW.   Stairs             Wheelchair Mobility    Modified Rankin (Stroke Patients Only) Modified Rankin (Stroke Patients Only) Modified Rankin: Moderately severe disability     Balance Overall balance assessment: Needs assistance Sitting-balance support: No upper extremity supported;Single extremity supported Sitting balance-Leahy Scale: Fair Sitting balance - Comments: noticed no attempts to right himself, but maintain midline sitting.     Standing balance-Leahy Scale: Zero Standing balance comment: max assist required at times                            Cognition Arousal/Alertness: Awake/alert;Lethargic Behavior During Therapy: Flat affect Overall Cognitive Status: No family/caregiver present to determine baseline cognitive functioning                                        Exercises Other Exercises Other Exercises: warm up hip/knee flex/ext ROM exercise AA/A x10    General Comments        Pertinent Vitals/Pain Pain Assessment: Faces Faces Pain Scale: No hurt    Home Living  Prior Function            PT Goals (current goals can now be found in the care plan section) Acute Rehab PT Goals PT Goal Formulation: With patient Time For Goal Achievement: 04/17/20 Potential to Achieve Goals: Good Progress towards PT goals: Progressing toward goals    Frequency    Min 4X/week      PT Plan Current plan remains appropriate    Co-evaluation              AM-PAC PT "6 Clicks" Mobility   Outcome Measure  Help needed turning from your back to your side while in a  flat bed without using bedrails?: Total Help needed moving from lying on your back to sitting on the side of a flat bed without using bedrails?: Total Help needed moving to and from a bed to a chair (including a wheelchair)?: Total Help needed standing up from a chair using your arms (e.g., wheelchair or bedside chair)?: Total Help needed to walk in hospital room?: Total Help needed climbing 3-5 steps with a railing? : Total 6 Click Score: 6    End of Session Equipment Utilized During Treatment: Gait belt Activity Tolerance: Patient limited by fatigue;Patient tolerated treatment well Patient left: in bed;with call bell/phone within reach;with bed alarm set Nurse Communication: Mobility status PT Visit Diagnosis: Unsteadiness on feet (R26.81);Other abnormalities of gait and mobility (R26.89);Muscle weakness (generalized) (M62.81);Other symptoms and signs involving the nervous system (R29.898)     Time: 1345-1410 PT Time Calculation (min) (ACUTE ONLY): 25 min  Charges:  $Gait Training: 8-22 mins $Neuromuscular Re-education: 8-22 mins                     04/08/2020  Ginger Carne., PT Acute Rehabilitation Services [phone]  (pager) [phone]  (office)   Jack [name] 04/08/2020, 5:19 PM

## 2020-04-08 NOTE — Progress Notes (Signed)
Jack Gong, RN  Rehab Admission Coordinator  Physical Medicine and Rehabilitation  PMR Pre-admission     Signed  Date of Service:  04/04/2020  2:41 PM      Related encounter: ED to Hosp-Admission (Discharged) from 04/02/2020 in [facility]      Signed       Show:Clear all [x] Manual[x] Template[x] Copied  Added by: [x] Julious Payer Vertis Kelch, RN  [] Hover for details PMR Admission Coordinator Pre-Admission Assessment   Patient: Jack [name] is an 86 y.o., male MRN: [id number] DOB: 1934-04-23 Height:   Weight:                                                                                                                                                    Insurance Information HMO: yes    PPO:      PCP:      IPA:      80/20:      OTHER:  PRIMARY: [employer] Medicare    Policy#: [id number]      Subscriber: pt CM Name: Jack [name]    Phone#: [phone] option 8     Fax#: [phone] Pre-Cert#: [id number] approved for 7 days      Employer:  Benefits:  Phone #: [phone]     Name:  Eff. Date: 07/13/2019     Deduct: $200      Out of Pocket Max: $2200      Life Max: none  CIR: 100%      SNF: 100% 100 days Outpatient: 100%     Co-Pay: visits per medical neccesity Home Health: 100%      Co-Pay: visits per medical neccesity DME: 100%     Co-Pay: none Providers: in network  SECONDARY: none   Financial Counselor:       Phone#:    [employer]" for patients in Inpatient Rehabilitation Facilities with attached "Privacy Act [place] Records" was provided and verbally reviewed with: Family   Emergency Contact Information         Contact Information     Name Relation Home Work Mobile    Jack [name] Spouse [phone]   Jack [name] Son     [phone]       Current Medical History  Patient Admitting Diagnosis: CVA  History of Present Illness:  86 y.o. male with history of L-MCA infarct with residual L-HP and A  fib --no anticoagulation due to prior ICH, vascular dementia 04/02/2020 with low-grade fever and worsening of confusion.   CT head done showing suspicion of hemorrhage from left basal ganglia, left caudate region felt to be hypertensive in nature as well as extensive microvascular changes. He was started on IV antibiotics as well as Cleviprex.   ASA discontinued and bleed felt to be likely CVA in nature. 2D echo showed EF  65 to 70% with mild LVH, moderate TVR and biatrial dilatation.  He developed acute pulmonary edema with shortness of breath 09/23 and was treated with dose of Lasix.   Per reports, at baseline patient was able to ambulate with use of rolling walker.     Now on diet, need to encourage po intake. MBS 9.24  Dysphagia 1 with thin liquids, Meds crushed with puree.     Complete NIHSS TOTAL: 8 Glasgow Coma Scale Score: 14   Past Medical History      Past Medical History:  Diagnosis Date  . Acute CVA (cerebrovascular accident) ([place]) 07/05/2016  . Atrial fibrillation ([place])      Dr. Tollie Eth  . BPH (benign prostatic hypertrophy) 2012    prior therapy with finasteride, but as of 03/2015 no c/o; Dr. Luanne Bras  . Cellulitis of face 11/08    [facility]  . CVA (cerebral infarction) 2009, 2015  . Echocardiogram abnormal 02/11/2010    LVEF 70%, severe mitral regurgitation, pulm HTN   . ED (erectile dysfunction)      Viagra prn, consult with Urology 2012  . Elevated PSA 2012    BPH; Dr. Luanne Bras, [facility]; no more PSAs needed as of 2012 consult  . Gait abnormality      uses cane, s/p CVA  . Hemiparesis affecting left side as late effect of cerebrovascular accident ([place])    . Hiatal hernia      per 2008 EUS  . Hyperlipidemia    . Hypertension    . Microscopic hematuria      chronic, eval 2012 with Urology, benign at that time  . Obstructive uropathy 2012    Dr. Luanne Bras  . Pancreatic mass 01/2006    inconsequential per CT 2007  .  Parapelvic renal cyst 01/2006    CT demonstrated inconsequential cyst  . Patent foramen ovale      hx/o, notation of fistula on echocardiogram from 2009  . Prophylactic antibiotic 2016    requires due to valve disease  . Schatzki's ring 2008    per endoscopic ultrasound; Dr. Janetta Hora. [name]  . Stroke ([place])    . Ventral hernia      surgery consult 01/2010 with Dr. Excell Seltzer, elective repair if desired , but he declined  . Wears hearing aid        Family History  Family history is unknown by patient.   Prior Rehab/Hospitalizations:  Has the patient had prior rehab or hospitalizations prior to admission? Yes CIR 2017   Has the patient had major surgery during 100 days prior to admission? No   Current Medications    Current Facility-Administered Medications:  .   stroke: mapping our early stages of recovery book, , Does not apply, Once, Amie Portland, MD .  acetaminophen (TYLENOL) tablet 650 mg, 650 mg, Oral, Q4H PRN **OR** acetaminophen (TYLENOL) 160 MG/5ML solution 650 mg, 650 mg, Per Tube, Q4H PRN **OR** acetaminophen (TYLENOL) suppository 650 mg, 650 mg, Rectal, Q4H PRN, Amie Portland, MD .  amLODipine (NORVASC) tablet 5 mg, 5 mg, Oral, BID, Kerney Elbe, MD, 5 mg at 04/08/20 1054 .  Chlorhexidine Gluconate Cloth 2 % PADS 6 each, 6 each, Topical, Daily, Jesusita Oka, MD, 6 each at 04/07/20 1102 .  donepezil (ARICEPT) tablet 10 mg, 10 mg, Oral, QHS, Rosalin Hawking, MD, 10 mg at 04/07/20 2154 .  heparin injection 5,000 Units, 5,000 Units, Subcutaneous, Q8H, Rosalin Hawking, MD, 5,000 Units at 04/08/20 0631 .  ipratropium (ATROVENT) nebulizer solution 0.5 mg, 0.5 mg, Nebulization, Q6H PRN, Biby, Sharon L, NP, 0.5 mg at 04/08/20 1130 .  irbesartan (AVAPRO) tablet 150 mg, 150 mg, Oral, Daily, Rosalin Hawking, MD, 150 mg at 04/08/20 1054 .  labetalol (NORMODYNE) injection 5-20 mg, 5-20 mg, Intravenous, Q2H PRN, Rosalin Hawking, MD .  pantoprazole (PROTONIX) EC tablet 40 mg,  40 mg, Oral, Daily, Rosalin Hawking, MD, 40 mg at 04/08/20 1054 .  potassium chloride (KLOR-CON) packet 20 mEq, 20 mEq, Oral, BID, Blenda Nicely, RPH, 20 mEq at 04/08/20 1054 .  QUEtiapine (SEROQUEL) tablet 25 mg, 25 mg, Oral, QHS, Rosalin Hawking, MD, 25 mg at 04/07/20 2154 .  senna-docusate (Senokot-S) tablet 1 tablet, 1 tablet, Oral, BID, Amie Portland, MD, 1 tablet at 04/08/20 1054 .  sodium chloride flush (NS) 0.9 % injection 3 mL, 3 mL, Intravenous, Once, Sherwood Gambler, MD .  tamsulosin (FLOMAX) capsule 0.4 mg, 0.4 mg, Oral, QPC breakfast, Rosalin Hawking, MD, 0.4 mg at 04/08/20 6256   Patients Current Diet:     Diet Order                      DIET - DYS 1 Room service appropriate? Yes with Assist; Fluid consistency: Thin  Diet effective now                      Precautions / Restrictions Precautions Precautions: Fall Precaution Comments: monitor SpO2 Restrictions Weight Bearing Restrictions: No    Has the patient had 2 or more falls or a fall with injury in the past year?No   Prior Activity Level Limited Community (1-2x/wk): sedentary, Mod I with RW, superivison with adls by wife   Prior Functional Level Prior Function Level of Independence: Needs assistance Gait / Transfers Assistance Needed: pt ambulates with a RW, unsure of distance tolerated ADL's / Homemaking Assistance Needed: pt requires assistance for all ADLs per chart Comments: clairified with wife   Self Care: Did the patient need help bathing, dressing, using the toilet or eating?  Needed some help   Indoor Mobility: Did the patient need assistance with walking from room to room (with or without device)? Needed some help   Stairs: Did the patient need assistance with internal or external stairs (with or without device)? Needed some help   Functional Cognition: Did the patient need help planning regular tasks such as shopping or remembering to take medications? Needed some help   Home Assistive Devices /  Equipment   Prior Device Use: Indicate devices/aids used by the patient prior to current illness, exacerbation or injury? Walker   Current Functional Level Cognition   Arousal/Alertness: Awake/alert Overall Cognitive Status: Difficult to assess Difficult to assess due to: Hard of hearing/deaf Orientation Level: Oriented to person General Comments: pt is oriented to person and birthdate only Attention: Sustained Sustained Attention: Impaired Sustained Attention Impairment: Verbal basic, Functional basic Memory: Impaired Memory Impairment: Storage deficit, Decreased short term memory, Retrieval deficit, Decreased long term memory Decreased Long Term Memory: Verbal basic Decreased Short Term Memory: Verbal basic Awareness: Impaired Awareness Impairment: Emergent impairment, Intellectual impairment Problem Solving: Impaired Problem Solving Impairment: Functional basic Executive Function: Initiating Initiating: Impaired Initiating Impairment: Verbal basic, Functional basic    Extremity Assessment (includes Sensation/Coordination)   Upper Extremity Assessment: Generalized weakness RUE Deficits / Details: lifted both arms alternately with cues  when I was putting gait belt on him RUE Sensation: decreased light touch LUE Deficits / Details: lifted both  arms alternately with cues  when I was putting gait belt on him LUE Sensation: decreased light touch LUE Coordination: decreased gross motor, decreased fine motor  Lower Extremity Assessment: Generalized weakness     ADLs   Overall ADL's : Needs assistance/impaired Eating/Feeding: Total assistance, Bed level Grooming: Wash/dry face, Set up, Supervision/safety, Bed level Grooming Details (indicate cue type and reason): HOB up Upper Body Bathing: Maximal assistance, Bed level Lower Body Bathing: Total assistance, Bed level Lower Body Bathing Details (indicate cue type and reason): Mod A sit<>stand Upper Body Dressing : Total  assistance, Bed level Lower Body Dressing: Total assistance, Bed level Lower Body Dressing Details (indicate cue type and reason): Mod A sit<>stand Toilet Transfer: Maximal assistance, Buyer, retail Details (indicate cue type and reason): recliner>bed on his right; took 4 attempts of sit<>stand to get him to turn his feet to get to bed and A for weight shifts Toileting- Clothing Manipulation and Hygiene: Total assistance Toileting - Clothing Manipulation Details (indicate cue type and reason): Mod A sit<>stand     Mobility   Overal bed mobility: Needs Assistance Bed Mobility: Supine to Sit Supine to sit: Mod assist Sit to supine: Max assist General bed mobility comments: A for trunk and legs, VCs for sequencing     Transfers   Overall transfer level: Needs assistance Equipment used: 1 person hand held assist, Rolling walker (2 wheeled) Transfers: Sit to/from Stand Sit to Stand: Mod assist, Max assist Stand pivot transfers: Max assist General transfer comment: Without RW, mod to stand and max to maintain standing balance. With the RW, mod to stand and gain/maintain standing balance.      Ambulation / Gait / Stairs / Wheelchair Mobility   Ambulation/Gait Ambulation/Gait assistance: Mod assist, Max assist Gait Distance (Feet): 15 Feet Assistive device: Rolling walker (2 wheeled) Gait Pattern/deviations: Step-to pattern, Trunk flexed, Staggering left, Drifts right/left, Narrow base of support General Gait Details: Up to max assist to manage walker around turns. Pt with L lateral lean and a LOB to the L in which max assist was required to recover. Pt reaching out for the window sill during this time.  Gait velocity: Decreased Gait velocity interpretation: <1.31 ft/sec, indicative of household ambulator     Posture / Balance Dynamic Sitting Balance Sitting balance - Comments: minA due to posterior lean Balance Overall balance assessment: Needs assistance Sitting-balance  support: Bilateral upper extremity supported, Feet supported Sitting balance-Leahy Scale: Poor Sitting balance - Comments: minA due to posterior lean Postural control: Posterior lean Standing balance support: Bilateral upper extremity supported Standing balance-Leahy Scale: Zero Standing balance comment: max assist required at times     Special needs/care consideration Visitor is wife, Rulon Eisenmenger HOH        Previous Home Environment  Living Arrangements: Spouse/significant other  Lives With: Spouse Available Help at Discharge: Family, Available 24 hours/day Type of Home: House Home Layout: Multi-level, 1/2 bath on main level, Able to live on main level with bedroom/bathroom Alternate Level Stairs-Number of Steps: multilevel/split level Home Access: Level entry Bathroom Shower/Tub: Chiropodist: Standard Bathroom Accessibility: Yes How Accessible: Accessible via walker Home Care Services: No Additional Comments: no family present, pt cognitively altered, OT obtaining limited history from chart review of this admission   Discharge Living Setting Plans for Discharge Living Setting: Patient's home, Lives with (comment) (wife) Type of Home at Discharge: House Discharge Home Layout: Multi-level, 1/2 bath on main level, Able to live on main level with bedroom/bathroom Alternate Level  Stairs-Rails: Right Alternate Level Stairs-Number of Steps: 2 set of 7 steps with split level Discharge Home Access: Level entry Discharge Bathroom Shower/Tub: Tub/shower unit Discharge Bathroom Toilet: Standard Discharge Bathroom Accessibility: Yes How Accessible: Accessible via walker Does the patient have any problems obtaining your medications?: No   Social/Family/Support Systems Contact Information: wife, [name] Anticipated Caregiver: wife and one local son and dtr in law Anticipated Caregiver's Contact Information: see above Ability/Limitations of Caregiver: none Caregiver  Availability: 24/7 Discharge Plan Discussed with Primary Caregiver: Yes Is Caregiver In Agreement with Plan?: Yes Does Caregiver/Family have Issues with Lodging/Transportation while Pt is in Rehab?: No   Goals Patient/Family Goal for Rehab: supervision to min assist wiht PT, OT, and SLP Expected length of stay: ELOS 2 to 3 weeks Pt/Family Agrees to Admission and willing to participate: Yes Program Orientation Provided & Reviewed with Pt/Caregiver Including Roles  & Responsibilities: Yes   Decrease burden of Care through IP rehab admission: n/a   Possible need for SNF placement upon discharge:not anticipated   Patient Condition: This patient's medical and functional status has changed since the consult dated: 04/04/2020 in which the Rehabilitation Physician determined and documented that the patient's condition is appropriate for intensive rehabilitative care in an inpatient rehabilitation facility. See "History of Present Illness" (above) for medical update. Functional changes are: overall mod assist. Patient's medical and functional status update has been discussed with the Rehabilitation physician and patient remains appropriate for inpatient rehabilitation. Will admit to inpatient rehab today.   Preadmission Screen Completed By:  Cleatrice Burke, RN, 04/08/2020 1:38 PM ______________________________________________________________________   Discussed status with Dr. Ranell Patrick on 04/08/2020 at 1340 and received approval for admission today.   Admission Coordinator: Cleatrice Burke, time 6712 Date 04/08/2020             Cosigned by: Izora Ribas, MD at 04/08/2020  1:43 PM  Revision History                                         Note Details  Author Jack Gong, RN File Time 04/08/2020  1:40 PM  Author Type Rehab Admission Coordinator Status Signed  Last Editor Jack Gong, RN Service Physical Medicine and Rehabilitation

## 2020-04-08 NOTE — Progress Notes (Signed)
Inpatient Rehabilitation  Patient information reviewed and entered into eRehab system by Seferino Oscar M. Hadriel Northup, M.A., CCC/SLP, PPS Coordinator.  Information including medical coding, functional ability and quality indicators will be reviewed and updated through discharge.    

## 2020-04-08 NOTE — TOC Transition Note (Signed)
Transition of Care [facility]) - CM/SW Discharge Note   Patient Details  Name: EMANI MORAD MRN: [id number] Date of Birth: 31-Mar-1934  [facility] CM/SW Contact:  Pollie Friar, RN Phone Number: 04/08/2020, 1:33 PM   Clinical Narrative:    Pt is discharging to CIR today. CM signing off.  Final next level of care: IP Rehab Facility Barriers to Discharge: No Barriers Identified   Patient Goals and CMS Choice        Discharge Placement                       Discharge Plan and Services                                     Social Determinants of Health (SDOH) Interventions     Readmission Risk Interventions No flowsheet data found.

## 2020-04-09 ENCOUNTER — Inpatient Hospital Stay (HOSPITAL_COMMUNITY): Payer: Medicare Other

## 2020-04-09 ENCOUNTER — Inpatient Hospital Stay (HOSPITAL_COMMUNITY): Payer: Medicare Other | Admitting: Occupational Therapy

## 2020-04-09 ENCOUNTER — Inpatient Hospital Stay (HOSPITAL_COMMUNITY): Payer: Medicare Other | Admitting: Speech Pathology

## 2020-04-09 LAB — CBC WITH DIFFERENTIAL/PLATELET
Abs Immature Granulocytes: 0.06 10*3/uL (ref 0.00–0.07)
Basophils Absolute: 0.1 10*3/uL (ref 0.0–0.1)
Basophils Relative: 1 %
Eosinophils Absolute: 0.3 10*3/uL (ref 0.0–0.5)
Eosinophils Relative: 3 %
HCT: 37.1 % — ABNORMAL LOW (ref 39.0–52.0)
Hemoglobin: 11.9 g/dL — ABNORMAL LOW (ref 13.0–17.0)
Immature Granulocytes: 1 %
Lymphocytes Relative: 16 %
Lymphs Abs: 1.4 10*3/uL (ref 0.7–4.0)
MCH: 29.9 pg (ref 26.0–34.0)
MCHC: 32.1 g/dL (ref 30.0–36.0)
MCV: 93.2 fL (ref 80.0–100.0)
Monocytes Absolute: 0.7 10*3/uL (ref 0.1–1.0)
Monocytes Relative: 8 %
Neutro Abs: 6.6 10*3/uL (ref 1.7–7.7)
Neutrophils Relative %: 71 %
Platelets: 319 10*3/uL (ref 150–400)
RBC: 3.98 MIL/uL — ABNORMAL LOW (ref 4.22–5.81)
RDW: 15.3 % (ref 11.5–15.5)
WBC: 9.2 10*3/uL (ref 4.0–10.5)
nRBC: 0 % (ref 0.0–0.2)

## 2020-04-09 LAB — COMPREHENSIVE METABOLIC PANEL
ALT: 30 U/L (ref 0–44)
AST: 29 U/L (ref 15–41)
Albumin: 3.2 g/dL — ABNORMAL LOW (ref 3.5–5.0)
Alkaline Phosphatase: 40 U/L (ref 38–126)
Anion gap: 10 (ref 5–15)
BUN: 25 mg/dL — ABNORMAL HIGH (ref 8–23)
CO2: 27 mmol/L (ref 22–32)
Calcium: 9 mg/dL (ref 8.9–10.3)
Chloride: 110 mmol/L (ref 98–111)
Creatinine, Ser: 1.01 mg/dL (ref 0.61–1.24)
GFR calc Af Amer: >60 mL/min (ref >60)
GFR calc non Af Amer: >60 mL/min (ref >60)
Glucose, Bld: 103 mg/dL — ABNORMAL HIGH (ref 70–99)
Potassium: 4.4 mmol/L (ref 3.5–5.1)
Sodium: 147 mmol/L — ABNORMAL HIGH (ref 135–145)
Total Bilirubin: 0.8 mg/dL (ref 0.3–1.2)
Total Protein: 6.3 g/dL — ABNORMAL LOW (ref 6.5–8.1)

## 2020-04-09 MED ORDER — METHYLPHENIDATE HCL 5 MG PO TABS
5.0000 mg | ORAL_TABLET | Freq: Two times a day (BID) | ORAL | Status: DC
  Administered 2020-04-10 – 2020-04-23 (×26): 5 mg via ORAL
  Filled 2020-04-09 (×27): qty 1

## 2020-04-09 NOTE — Evaluation (Signed)
Occupational Therapy Assessment and Plan  Patient Details  Name: Jack [name] MRN: [id number] Date of Birth: 05/25/1934  OT Diagnosis: abnormal posture, altered mental status, cognitive deficits, hemiplegia affecting dominant side and muscle weakness (generalized) Rehab Potential: Rehab Potential (ACUTE ONLY): Good ELOS: 12-14 days   Today's Date: 04/09/2020 OT Individual Time: 7425-9563 OT Individual Time Calculation (min): 76 min     Hospital Problem: Principal Problem:   ICH (intracerebral hemorrhage) ([place]) - L caudate head w/ IVH and SAH   Past Medical History:  Past Medical History:  Diagnosis Date  . Acute CVA (cerebrovascular accident) ([place]) 07/05/2016  . Atrial fibrillation ([place])    Dr. Tollie Eth  . BPH (benign prostatic hypertrophy) 2012   prior therapy with finasteride, but as of 03/2015 no c/o; Dr. Luanne Bras  . Cancer ([place])   . Cellulitis of face 11/08   [facility]NT  . CVA (cerebral infarction) 2009, 2015  . Echocardiogram abnormal 02/11/2010   LVEF 70%, severe mitral regurgitation, pulm HTN   . ED (erectile dysfunction)    Viagra prn, consult with Urology 2012  . Elevated PSA 2012   BPH; Dr. Luanne Bras, [facility]; no more PSAs needed as of 2012 consult  . Gait abnormality    uses cane, s/p CVA  . Hemiparesis affecting left side as late effect of cerebrovascular accident ([place])   . Hiatal hernia    per 2008 EUS  . Hyperlipidemia   . Hypertension   . Microscopic hematuria    chronic, eval 2012 with Urology, benign at that time  . Obstructive uropathy 2012   Dr. Luanne Bras  . Pancreatic mass 01/2006   inconsequential per CT 2007  . 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 Patent foramen ovale    hx/o, notation of fistula on echocardiogram from 2009  . Prophylactic antibiotic 2016   requires due to valve disease  . Schatzki's ring 2008   per endoscopic ultrasound; Dr. Janetta Hora. Herbie [place]  Buccini  . Stroke ([place])   . Ventral hernia    surgery consult 01/2010 with Dr. Excell Seltzer, elective repair if desired , but he declined  . Wears hearing aid    Past Surgical History:  Past Surgical History:  Procedure Laterality Date  . CARDIAC CATHETERIZATION  02/2010   LVEF 70%, severe mitral regurgitation, pulm HTN; prior in 01/2006  . INGUINAL HERNIA REPAIR     bilat  . MITRAL VALVE REPAIR  01/2006   mitral repair with Maze procedure, tricuspid annuloplasty, MAZE and LAA oversewn by Dr. Roxy Manns    Assessment & Plan Clinical Impression: Patient is a 86 y.o. year old male with recent admission to the hospital on 04/02/2020 with low-grade fever and worsening of confusion.  CT head done showing suspicion of hemorrhage from left basal ganglia and left caudate region felt to be hypertensive in nature.   Patient transferred to CIR on 04/08/2020 .    Patient currently requires total with basic self-care skills secondary to muscle weakness and muscle paralysis, unbalanced muscle activation and decreased coordination, decreased initiation, decreased attention, decreased awareness, decreased problem solving, decreased safety awareness, decreased memory and delayed processing and decreased standing balance, decreased postural control and decreased balance strategies.  Prior to hospitalization, patient could complete ADLs with min.  Patient will benefit from skilled intervention to decrease level of assist with basic self-care skills and increase independence with basic self-care skills prior to discharge home with care partner.  Anticipate patient  will require minimal physical assistance and follow up home health.  OT - End of Session Activity Tolerance: Improving Endurance Deficit: Yes OT Assessment Rehab Potential (ACUTE ONLY): Good OT Patient demonstrates impairments in the following area(s): Balance;Cognition;Safety;Motor;Endurance OT Basic ADL's Functional Problem(s):  Eating;Grooming;Bathing;Dressing;Toileting OT Transfers Functional Problem(s): Toilet;Tub/Shower OT Additional Impairment(s): Fuctional Use of Upper Extremity OT Plan OT Intensity: Minimum of 1-2 x/day, 45 to 90 minutes OT Frequency: 5 out of 7 days OT Duration/Estimated Length of Stay: 12-14 days OT Treatment/Interventions: Balance/vestibular training;Discharge planning;Pain management;Self Care/advanced ADL retraining;Therapeutic Activities;UE/LE Coordination activities;Cognitive remediation/compensation;Disease mangement/prevention;Functional mobility training;Patient/family education;Therapeutic Exercise;DME/adaptive equipment instruction;Neuromuscular re-education;UE/LE Strength taining/ROM OT Self Feeding Anticipated Outcome(s): independent OT Basic Self-Care Anticipated Outcome(s): supervision to min assist OT Toileting Anticipated Outcome(s): min guard OT Bathroom Transfers Anticipated Outcome(s): min guard OT Recommendation Recommendations for Other Services: Neuropsych consult Patient destination: Home Follow Up Recommendations: Home health OT;24 hour supervision/assistance Equipment Recommended: To be determined   OT Evaluation Precautions/Restrictions  Precautions Precautions: Fall Restrictions Weight Bearing Restrictions: No General   Vital Signs Therapy Vitals Temp: (!) 97.5 F (36.4 C) Pulse Rate: 96 Resp: 18 BP: 98/60 Patient Position (if appropriate): Sitting Oxygen Therapy SpO2: 95 % O2 Device: Room Air Pain Pain Assessment Pain Scale: Faces Pain Score: 0-No pain Home Living/Prior Functioning Home Living Living Arrangements: Spouse/significant other Available Help at Discharge: Family, Available 24 hours/day Type of Home: House Home Access: Level entry Home Layout: Multi-level, 1/2 bath on main level, Able to live on main level with bedroom/bathroom Alternate Level Stairs-Number of Steps: multilevel/split level Alternate Level Stairs-Rails:  Right Bathroom Shower/Tub: Chiropodist: Standard Bathroom Accessibility: Yes  Lives With: Spouse IADL History Homemaking Responsibilities: No Current License: No Occupation: Retired Prior Function Level of Independence: Needs assistance with ADLs Dressing: Moderate  Able to Take Stairs?: Yes Driving: No Vocation: Retired Surveyor, mining Baseline Vision/History: Wears glasses Wears Glasses: Reading only Patient Visual Report: No change from baseline Additional Comments: To be further tested in functional context Perception  Perception: Within Functional Limits Praxis Praxis: Impaired Praxis Impairment Details: Initiation Cognition Overall Cognitive Status: Impaired/Different from baseline Arousal/Alertness: Awake/alert Orientation Level: Person;Place;Situation Person: Disoriented Place: Disoriented Situation: Disoriented Year:  (unable to state when asked) Month:  (unable to state when asked) Day of Week: Incorrect Memory: Impaired Memory Impairment: Storage deficit;Decreased recall of new information;Decreased short term memory Decreased Long Term Memory: Verbal basic Decreased Short Term Memory: Verbal basic;Functional basic Immediate Memory Recall:  (unable to state when asked) Memory Recall Sock: Not able to recall Memory Recall Blue: Not able to recall Memory Recall Bed: Not able to recall Attention: Focused Focused Attention: Appears intact Sustained Attention: Impaired Sustained Attention Impairment: Verbal basic;Functional basic Awareness: Impaired Awareness Impairment: Intellectual impairment Problem Solving: Impaired Problem Solving Impairment: Functional basic;Verbal basic Executive Function:  (all impaired due to lower level deficits) Safety/Judgment: Impaired Sensation Sensation Light Touch: Appears Intact Hot/Cold: Appears Intact Proprioception: Appears Intact Stereognosis: Not tested Additional Comments: Sensation appears intact in  BUEs with gross testing. Coordination Gross Motor Movements are Fluid and Coordinated: No Fine Motor Movements are Fluid and Coordinated: No Coordination and Movement Description: Ataxia noted in the LUE from previous CVA. Motor  Motor Motor: Hemiplegia Motor - Skilled Clinical Observations: Hx of left hemiparesis from previous CVA  Trunk/Postural Assessment  Cervical Assessment Cervical Assessment: Exceptions to [facility] (cervical protraction) Thoracic Assessment Thoracic Assessment: Exceptions to [facility] (thoracic rounding) Lumbar Assessment Lumbar Assessment: Exceptions to [facility] (posterior pelvic tilt in sitting increased flexion noted in standing) Postural Control  Postural Control: Deficits on evaluation Trunk Control: maintains posterior lean Righting Reactions: delayed and inadequate Protective Responses: delayed and inadequate  Balance Balance Balance Assessed: Yes Static Sitting Balance Static Sitting - Balance Support: Feet supported Static Sitting - Level of Assistance: 5: Stand by assistance Dynamic Sitting Balance Dynamic Sitting - Balance Support: During functional activity Dynamic Sitting - Level of Assistance: 4: Min assist Sitting balance - Comments: No attempts made to correct progressive posterior lean Static Standing Balance Static Standing - Balance Support: During functional activity Static Standing - Level of Assistance: 2: Max assist Dynamic Standing Balance Dynamic Standing - Balance Support: During functional activity Dynamic Standing - Level of Assistance: 1: +2 Total assist Extremity/Trunk Assessment RUE Assessment RUE Assessment: Within Functional Limits Active Range of Motion (AROM) Comments: AROM WFLs overall General Strength Comments: strength 4/5 throughout LUE Assessment LUE Assessment: Exceptions to [facility] General Strength Comments: history of mild hemiparesis from previous CVA.  Can use functionally at a non-dominant level during selfcare tasks.   Increased ataxic movement noted with functional reach.  Care Tool Care Tool Self Care Eating        Oral Care         Bathing   Body parts bathed by patient: Right arm;Left arm;Chest;Abdomen;Right upper leg;Left upper leg;Face Body parts bathed by helper: Right lower leg;Left lower leg;Front perineal area;Buttocks   Assist Level: 2 Helpers    Upper Body Dressing(including orthotics)   What is the patient wearing?: Pull over shirt   Assist Level: Minimal Assistance - Patient > 75%    Lower Body Dressing (excluding footwear)   What is the patient wearing?: Incontinence brief;Pants Assist for lower body dressing: Maximal Assistance - Patient 25 - 49%    Putting on/Taking off footwear   What is the patient wearing?: Non-skid slipper socks Assist for footwear: Dependent - Patient 0%       Care Tool Toileting Toileting activity   Assist for toileting: 2 Helpers     Care Tool Bed Mobility Roll left and right activity   Roll left and right assist level: Maximal Assistance - Patient 25 - 49%    Sit to lying activity   Sit to lying assist level: Moderate Assistance - Patient 50 - 74%    Lying to sitting edge of bed activity   Lying to sitting edge of bed assist level: Moderate Assistance - Patient 50 - 74%     Care Tool Transfers Sit to stand transfer   Sit to stand assist level: Moderate Assistance - Patient 50 - 74%    Chair/bed transfer   Chair/bed transfer assist level: Maximal Assistance - Patient 25 - 49%     Toilet transfer Toilet transfer activity did not occur: Safety/medical concerns (patient fatigue/weakness) Assist Level: Maximal Assistance - Patient 24 - 49%     Care Tool Cognition Expression of Ideas and Wants Expression of Ideas and Wants: Frequent difficulty - frequently exhibits difficulty with expressing needs and ideas   Understanding Verbal and Non-Verbal Content Understanding Verbal and Non-Verbal Content: Usually understands - understands most  conversations, but misses some part/intent of message. Requires cues at times to understand   Memory/Recall Ability *first 3 days only Memory/Recall Ability *first 3 days only: None of the above were recalled    Refer to Care Plan for Long Term Goals  SHORT TERM GOAL WEEK 1 OT Short Term Goal 1 (Week 1): Pt will complete LB bathing sit to stand with min assist for 2 consecutive attempts. OT Short  Term Goal 2 (Week 1): Pt will complete UB dressing sitting with supervision. OT Short Term Goal 3 (Week 1): Pt will complete toilet transfer with min assist using the RW for support. OT Short Term Goal 4 (Week 1): Pt will consistently follow one step commands related to selfcare tasks with 90% accuracy.  Recommendations for other services: Neuropsych   Skilled Therapeutic Intervention ADL ADL Eating: Minimal assistance Where Assessed-Eating: Wheelchair Grooming: Minimal assistance Where Assessed-Grooming: Wheelchair Upper Body Bathing: Supervision/safety Where Assessed-Upper Body Bathing: Edge of bed Lower Body Bathing: Dependent Where Assessed-Lower Body Bathing: Edge of bed Upper Body Dressing: Minimal assistance Where Assessed-Upper Body Dressing: Edge of bed Lower Body Dressing: Maximal assistance Where Assessed-Lower Body Dressing: Edge of bed Toileting: Dependent Where Assessed-Toileting: Bedside Commode Toilet Transfer: Maximal assistance Toilet Transfer Method: Stand pivot Toilet Transfer Equipment: Bedside commode Mobility  Bed Mobility Bed Mobility: Supine to Sit Rolling Right: Moderate Assistance - Patient 50-74% Supine to Sit: Moderate Assistance - Patient 50-74% Sitting - Scoot to Edge of Bed: Minimal Assistance - Patient > 75% Sit to Supine: Moderate Assistance - Patient 50-74% Transfers Sit to Stand: Moderate Assistance - Patient 50-74% Stand to Sit: Moderate Assistance - Patient 50-74%  Pt alert working on eating lunch to start session with NT feeding him.  Had  him pick up the spoon with his right hand and take a bite of his food with overall supervision.  He also picked up his cup and took a sip from the straw as well.  Noted some residual coughing after taking bites of his magic cup as therapist entered the room.  He needed use of the amplifier to start session secondary to limited hearing but his spouse came in with his hearing aides as session progressed.  He was able to transfer to the EOB with mod assist secondary to decreased initiation.  Max instructional cueing was needed for sequencing bathing task along with initiation of task.  Increased posterior lean noted in standing when attempting LB selfcare.  Pt with bowel incontinence when standing, requiring total assist +2 (pt 50%) for clean up.  He completed stand pivot transfer to and from the toilet with max assist using the RW.  Decreased ability to efficiently move his LEs during the transfer.  He also exhibited decreased ability to thread his pants over his feet, with his spouse stating that she had been assisting him with this at home.  Finished session with pt in the wheelchair and safety belt in place and spouse present.  Discussed expectations for min assist or better goals and approximately two week LOS.  Pt and spouse are in agreement.   Discharge Criteria: Patient will be discharged from OT if patient refuses treatment 3 consecutive times without medical reason, if treatment goals not met, if there is a change in medical status, if patient makes no progress towards goals or if patient is discharged from hospital.  The above assessment, treatment plan, treatment alternatives and goals were discussed and mutually agreed upon: by patient and by family  Jack [name] OTR/L 04/09/2020, 4:28 PM

## 2020-04-09 NOTE — Evaluation (Signed)
Physical Therapy Assessment and Plan  Patient Details  Name: Jack [name] MRN: [id number] Date of Birth: 12-25-1933  PT Diagnosis: Abnormal posture, Abnormality of gait, Cognitive deficits, Difficulty walking, Hemiplegia non-dominant, Impaired cognition, Impaired sensation and Muscle weakness Rehab Potential: Fair ELOS: 2-2.5 weeks   Today's Date: 04/09/2020 PT Individual Time: 1102-1200 PT Individual Time Calculation (min): 58 min    Hospital Problem: Principal Problem:   ICH (intracerebral hemorrhage) ([place]) - L caudate head w/ IVH and SAH   Past Medical History:  Past Medical History:  Diagnosis Date  . Acute CVA (cerebrovascular accident) ([place]) 07/05/2016  . Atrial fibrillation ([place])    Dr. Tollie Eth  . BPH (benign prostatic hypertrophy) 2012   prior therapy with finasteride, but as of 03/2015 no c/o; Dr. Luanne Bras  . Cancer ([place])   . Cellulitis of face 11/08   [facility]  . CVA (cerebral infarction) 2009, 2015  . Echocardiogram abnormal 02/11/2010   LVEF 70%, severe mitral regurgitation, pulm HTN   . ED (erectile dysfunction)    Viagra prn, consult with Urology 2012  . Elevated PSA 2012   BPH; Dr. Luanne Bras, [facility]; no more PSAs needed as of 2012 consult  . Gait abnormality    uses cane, s/p CVA  . Hemiparesis affecting left side as late effect of cerebrovascular accident ([place])   . Hiatal hernia    per 2008 EUS  . Hyperlipidemia   . Hypertension   . Microscopic hematuria    chronic, eval 2012 with Urology, benign at that time  . Obstructive uropathy 2012   Dr. Luanne Bras  . Pancreatic mass 01/2006   inconsequential per CT 2007  . 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 Patent foramen ovale    hx/o, notation of fistula on echocardiogram from 2009  . Prophylactic antibiotic 2016   requires due to valve disease  . Schatzki's ring 2008   per endoscopic ultrasound; Dr. Janetta Hora. Herbie [place]  Buccini  . Stroke ([place])   . Ventral hernia    surgery consult 01/2010 with Dr. Excell Seltzer, elective repair if desired , but he declined  . Wears hearing aid    Past Surgical History:  Past Surgical History:  Procedure Laterality Date  . CARDIAC CATHETERIZATION  02/2010   LVEF 70%, severe mitral regurgitation, pulm HTN; prior in 01/2006  . INGUINAL HERNIA REPAIR     bilat  . MITRAL VALVE REPAIR  01/2006   mitral repair with Maze procedure, tricuspid annuloplasty, MAZE and LAA oversewn by Dr. Roxy Manns    Assessment & Plan Clinical Impression: Patient is a 86 y.o. year old male with history of L-MCA infarct with residual left-HP, A. fib--no anticoagulation due to prior ICH, Schatzki's ring, vascular dementia (recent MMSE 15/30); who was admitted on 04/02/2020 with low-grade fever and worsening of confusion.  CT head done showing suspicion of hemorrhage from left basal ganglia and left caudate region felt to be hypertensive in nature.  He was febrile and tachycardic with positive UA at admission and treated with Rocephin briefly due to concerns of UTI.  Cleviprex added for BP control and ASA discontinued.  ICH/IVH felt to be due to small vessel disease with hypertension and lead to worsening of confusion.  2D echo done showing EF 65 to 70% with no source of emboli.  Hospital course significant for shortness of breath due to fluid overload treated with IV diuresis x2.  Follow-up chest x-ray shows minimal atelectasis left  base--patient has refused nebulizers.  He has defervesced and leukocytosis resolving.  Statin on hold due to bleed.  Follow-up CT head showed slight decrease in left basal ganglia hemorrhage and slight increase in IVH without hydrocephalus.  Swallow evaluation done and patient placed on dysphagia 1, thin liquids.  Therapy ongoing with lethargy resolving but patient continues to have balance deficits with left alteral lean and tendency to lose balance to the left, shuffling gait as  well as difficulty with ADLs.  CIR recommended due to functional decline Patient currently requires max with mobility secondary to muscle weakness, decreased cardiorespiratoy endurance, impaired timing and sequencing, abnormal tone, unbalanced muscle activation, motor apraxia, decreased coordination and decreased motor planning, decreased initiation, decreased attention, decreased awareness, decreased problem solving, decreased safety awareness, decreased memory and delayed processing and decreased sitting balance, decreased standing balance, decreased postural control, hemiplegia and decreased balance strategies.  Prior to hospitalization, patient was min with mobility and lived with Spouse in a House home.  Home access is  Level entry.  Patient will benefit from skilled PT intervention to maximize safe functional mobility, minimize fall risk and decrease caregiver burden for planned discharge home with 24 hour assist.  Anticipate patient will benefit from follow up [facility] at discharge.  PT - End of Session Activity Tolerance: Tolerates 30+ min activity with multiple rests Endurance Deficit: Yes Endurance Deficit Description: patient requiring frequent rest breaks and would often fall asleep throughout PT eval PT Assessment Rehab Potential (ACUTE/IP ONLY): Fair PT Barriers to Discharge: Home environment access/layout;Incontinence;Other (comments) PT Barriers to Discharge Comments: hx of multiple strokes PT Patient demonstrates impairments in the following area(s): Balance;Endurance;Motor;Perception;Safety;Sensory PT Transfers Functional Problem(s): Bed Mobility;Bed to Chair;Car;Furniture PT Locomotion Functional Problem(s): Ambulation;Wheelchair Mobility;Stairs PT Plan PT Intensity: Minimum of 1-2 x/day ,45 to 90 minutes PT Frequency: 5 out of 7 days PT Duration Estimated Length of Stay: 2-2.5 weeks PT Treatment/Interventions: Ambulation/gait training;Discharge planning;Functional mobility  training;Psychosocial support;Therapeutic Activities;Visual/perceptual remediation/compensation;Balance/vestibular training;Disease management/prevention;Neuromuscular re-education;Skin care/wound management;Therapeutic Exercise;Wheelchair propulsion/positioning;Cognitive remediation/compensation;DME/adaptive equipment instruction;Pain management;Splinting/orthotics;UE/LE Strength taining/ROM;Community reintegration;Functional electrical stimulation;Patient/family education;UE/LE Arts development officer PT Transfers Anticipated Outcome(s): grossly MinA PT Locomotion Anticipated Outcome(s): Grossly MinA PT Recommendation Recommendations for Other Services: Neuropsych consult Follow Up Recommendations: Home health PT;Outpatient PT Patient destination: Home Equipment Recommended: Rolling walker with 5" wheels;Wheelchair (measurements) Equipment Details: Patient owns FWW, may need wc (dimensions TBD)   PT Evaluation Precautions/Restrictions Precautions Precautions: Fall;Other (comment) (hard of hearing) Restrictions Weight Bearing Restrictions: No Pain Pain Assessment Pain Scale: 0-10 Pain Score: 0-No pain Faces Pain Scale: No hurt Home Living/Prior Functioning Home Living Available Help at Discharge: Family;Available 24 hours/day Type of Home: House Home Access: Level entry Home Layout: Multi-level;1/2 bath on main level;Able to live on main level with bedroom/bathroom Alternate Level Stairs-Number of Steps: multilevel/split level Alternate Level Stairs-Rails: Right Bathroom Shower/Tub: Chiropodist: Standard Bathroom Accessibility: Yes  Lives With: Spouse Prior Function Level of Independence: Needs assistance with gait;Needs assistance with ADLs;Requires assistive device for independence  Able to Take Stairs?: Yes Driving: No 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 Praxis: Impaired Praxis  Impairment Details: Perseveration  Cognition Overall Cognitive Status: No family/caregiver present to determine baseline cognitive functioning (Per chart review pt with some baseline deficits) Arousal/Alertness: Awake/alert Orientation Level: Oriented to person Attention: Sustained Sustained Attention: Impaired Sustained Attention Impairment: Verbal basic;Functional basic Memory: Impaired Memory Impairment: Decreased short term memory;Retrieval deficit;Decreased long term memory;Decreased recall of new information Decreased Long Term Memory: Verbal basic Decreased Short Term Memory: Verbal  basic;Functional basic Awareness: Impaired Awareness Impairment: Emergent impairment Problem Solving: Impaired Problem Solving Impairment: Functional basic;Verbal basic Executive Function:  (all impaired due to lower level deficits) Safety/Judgment: Impaired Sensation Sensation Light Touch: Appears Intact Hot/Cold: Not tested Proprioception: Appears Intact Stereognosis: Not tested Coordination Gross Motor Movements are Fluid and Coordinated: No Fine Motor Movements are Fluid and Coordinated: No Finger Nose Finger Test: slight dysmetria on L UE Heel Shin Test: Dysmetria to L LE Motor  Motor Motor: Hemiplegia;Abnormal tone;Abnormal postural alignment and control Motor - Skilled Clinical Observations: Increased tone to L LE, poor trunk control in sitting, L hemiparesis   Trunk/Postural Assessment  Cervical Assessment Cervical Assessment: Exceptions to [facility] (forward head) Thoracic Assessment Thoracic Assessment: Exceptions to [facility] (increased kyphosis) Lumbar Assessment Lumbar Assessment: Exceptions to [facility] (posterior pelvic tilt, L lateral lean) Postural Control Postural Control: Deficits on evaluation Trunk Control: maintains posterior lean Righting Reactions: delayed and inadequate Protective Responses: delayed and inadequate  Balance Balance Balance Assessed: Yes Dynamic Sitting  Balance Sitting balance - Comments: No attempts made to correct progressive posterior lean Extremity Assessment      RLE Assessment RLE Assessment: Exceptions to [facility] RLE Strength Right Hip Flexion: 3-/5 Right Hip ABduction: 3-/5 Right Hip ADduction: 3/5 Right Knee Flexion: 3/5 Right Knee Extension: 3/5 Right Ankle Dorsiflexion: 3+/5 Right Ankle Plantar Flexion: 3/5 LLE Assessment LLE Assessment: Exceptions to [facility] LLE Strength Left Hip Flexion: 2-/5 Left Hip ABduction: 2-/5 Left Hip ADduction: 2-/5 Left Knee Flexion: 2-/5 Left Knee Extension: 2/5 Left Ankle Dorsiflexion: 2/5 Left Ankle Plantar Flexion: 2/5  Care Tool Care Tool Bed Mobility Roll left and right activity   Roll left and right assist level: Maximal Assistance - Patient 25 - 49%    Sit to lying activity   Sit to lying assist level: Moderate Assistance - Patient 50 - 74%    Lying to sitting edge of bed activity   Lying to sitting edge of bed assist level: Moderate Assistance - Patient 50 - 74%     Care Tool Transfers Sit to stand transfer   Sit to stand assist level: Moderate Assistance - Patient 50 - 74%    Chair/bed transfer   Chair/bed transfer assist level: Maximal Assistance - Patient 25 - 49%     Toilet transfer Toilet transfer activity did not occur: Safety/medical concerns (patient fatigue/weakness)      Geneticist, molecular transfer assist level: Maximal Assistance - Patient 25 - 49%      Care Tool Locomotion Ambulation Ambulation activity did not occur: Safety/medical concerns (Patient ambulated 2 steps in // bars)        Walk 10 feet activity Walk 10 feet activity did not occur: Safety/medical concerns (patient fatigue/weakness)       Walk 50 feet with 2 turns activity Walk 50 feet with 2 turns activity did not occur: Safety/medical concerns (patient fatigue/weakness)      Walk 150 feet activity Walk 150 feet activity did not occur: Safety/medical concerns (patient fatigue/weakness)       Walk 10 feet on uneven surfaces activity Walk 10 feet on uneven surfaces activity did not occur: Safety/medical concerns (patient fatigue/weakness)      Stairs Stair activity did not occur: Safety/medical concerns (patient fatigue/weakness)        Walk up/down 1 step activity Walk up/down 1 step or curb (drop down) activity did not occur: Safety/medical concerns (patient fatigue/weakness)        Walk up/down 4 steps activity  Walk up/down 12 steps activity Walk up/down 12 steps activity did not occur: Safety/medical concerns (patient fatigue/weakness)      Pick up small objects from floor Pick up small object from the floor (from standing position) activity did not occur: Safety/medical concerns (patient fatigue/weakness)      Wheelchair Will patient use wheelchair at discharge?: Yes Type of Wheelchair: Manual   Wheelchair assist level: Total Assistance - Patient < 25%    Wheel 50 feet with 2 turns activity   Assist Level: Total Assistance - Patient < 25%  Wheel 150 feet activity   Assist Level: Total Assistance - Patient < 25%    Refer to Care Plan for Long Term Goals  SHORT TERM GOAL WEEK 1 PT Short Term Goal 1 (Week 1): Patient will maintain dynamic sitting balance >5 mins with no more than CGA PT Short Term Goal 2 (Week 1): Patient will complete STS with LRAD and no more than MinA PT Short Term Goal 3 (Week 1): Patient will transfer bed <> wc with LRAD and no more than ModA consistently PT Short Term Goal 4 (Week 1): Patient will ambulate >51f with LRAD and no more than ModA  Recommendations for other services: None   Skilled Therapeutic Intervention Mobility Bed Mobility Bed Mobility: Rolling Right;Sit to Supine;Supine to Sit;Sitting - Scoot to Edge of Bed Rolling Right: Moderate Assistance - Patient 50-74% Supine to Sit: Moderate Assistance - Patient 50-74% Sitting - Scoot to Edge of Bed: Minimal Assistance - Patient > 75% Sit to Supine: Moderate  Assistance - Patient 50-74% Transfers Transfers: Sit to Stand;Stand to Sit;Stand Pivot Transfers;Squat Pivot Transfers Sit to Stand: Moderate Assistance - Patient 50-74% Stand to Sit: Minimal Assistance - Patient > 75% Stand Pivot Transfers: Maximal Assistance - Patient 25 - 49% Stand Pivot Transfer Details: Tactile cues for initiation;Verbal cues for gait pattern;Verbal cues for sequencing;Tactile cues for sequencing;Tactile cues for weight shifting;Verbal cues for precautions/safety;Manual facilitation for placement;Manual facilitation for weight shifting Squat Pivot Transfers: Maximal Assistance - Patient 25-49% Transfer (Assistive device): 1 person hand held assist Locomotion  Gait Ambulation: Yes Gait Assistance: Moderate Assistance - Patient 50-74% (in // bars) Gait Distance (Feet): 2 Feet Assistive device: Parallel bars Gait Assistance Details: Verbal cues for gait pattern;Manual facilitation for weight shifting;Manual facilitation for placement;Verbal cues for precautions/safety;Tactile cues for posture Gait Gait: Yes Gait Pattern: Impaired Gait Pattern: Step-to pattern;Shuffle;Lateral trunk lean to left;Narrow base of support;Poor foot clearance - right;Poor foot clearance - left;Decreased trunk rotation Gait velocity: Decreased Stairs / Additional Locomotion Stairs: No Wheelchair Mobility Wheelchair Mobility: Yes Wheelchair Assistance: Total Assistance - Patient <25% Wheelchair Propulsion: Both upper extremities Wheelchair Parts Management: Needs assistance Distance: 150  Patient received supine in bed, initially difficult to awake from sleep, but agreeable to PT eval and denies pain. He requires grossly ModA-MaxA for functional mobility. He maintains moderate posterior lean in sitting and poor static standing balance. Patient educated on 3 hour rule, CIR expectations, team conference. Patient remaining in bed at end of PT eval with bed alarm on, call light within reach.    Discharge Criteria: Patient will be discharged from PT if patient refuses treatment 3 consecutive times without medical reason, if treatment goals not met, if there is a change in medical status, if patient makes no progress towards goals or if patient is discharged from hospital.  The above assessment, treatment plan, treatment alternatives and goals were discussed and mutually agreed upon: by patient  JDebbora Dus9/29/2021, 7:42 AM

## 2020-04-09 NOTE — Patient Care Conference (Signed)
Inpatient RehabilitationTeam Conference and Plan of Care Update Date: 04/09/2020   Time: 10:55 AM    Patient Name: Jack [name]      Medical Record Number: [id number]  Date of Birth: [birth year] Sex: Male         Room/Bed: 4M04C/4M04C-01 Payor Info: Payor: Marine scientist / Plan: [employer] MEDICARE / Product Type: *No Product type* /    Admit Date/Time:  04/08/2020  4:09 PM  Primary Diagnosis:  ICH (intracerebral hemorrhage) [facility])  Hospital Problems: Principal Problem:   ICH (intracerebral hemorrhage) ([place]) - L caudate head w/ IVH and SAH    Expected Discharge Date: Expected Discharge Date:  (reconference next week, evals pending)  Team Members Present: Physician leading conference: Dr. Alysia Penna Care Coodinator Present: Dorien Chihuahua, RN, BSN, CRRN;Christina Wykoff, Learned Nurse Present: Rozetta Nunnery, RN PT Present: Other (comment) Estevan Ryder, PT) OT Present: Clyda Greener, OT SLP Present: Jettie Booze, CF-SLP PPS Coordinator present : Gunnar Fusi, SLP     Current Status/Progress Goal Weekly Team Focus  Bowel/Bladder   incontinent x2  decrease incidence of incontinence  Q2 toileting while awake   Swallow/Nutrition/ Hydration   eval pending         ADL's   eval pending         Mobility   pending eval         Communication   eval pending         Safety/Cognition/ Behavioral Observations  eval pending         Pain   Faces pain scale  status no hurt  Maintain a faces scale pain goal of 2 or less  Assess pain qshift and PRN   Skin   Currently no skin issues  No new breakdown  Assess skin qshift and PRN     Discharge Planning:    Home with son and daughter in law; multi level home with bath/bed on main level.  Team Discussion: Patient with multi medical issues PTA; including dementia, previous ICH and CVA with left hemiparesis. Decreased alertness since most recent event. MD to check for UTI and then initiate ritalin trial. Incontinent of bowel and  bladder. Orthostasis noted and seroquel at HS. Discussion regarding use of hearing aide vs amplifier. Wife to bring in new hearing aide/batteries. Patient displays oral dysphagia issues. Patient on target to meet rehab goals: no, need confirmation of premorbid functional status  *See Care Plan and progress notes for long and short-term goals Revisions to Treatment Plan:   Teaching Needs: Transfers, toileting, medications, etc.  Current Barriers to Discharge: Decreased caregiver support, Incontinence and lethary  Possible Resolutions to Barriers: Ritalin trial pending Family education      Medical Summary Current Status: Poor level of alertness, hx of multiple CVAs  Barriers to Discharge: Medical stability   Possible Resolutions to Barriers/Weekly Focus: Check for reversible causes of poor arousal, trial ritalin   Continued Need for Acute Rehabilitation Level of Care: The patient requires daily medical management by a physician with specialized training in physical medicine and rehabilitation for the following reasons: Direction of a multidisciplinary physical rehabilitation program to maximize functional independence : Yes Medical management of patient stability for increased activity during participation in an intensive rehabilitation regime.: Yes Analysis of laboratory values and/or radiology reports with any subsequent need for medication adjustment and/or medical intervention. : Yes   I attest that I was present, lead the team conference, and concur with the assessment and plan of the team.   Hervey Ard,  Dalbert Batman 04/09/2020, 1:22 PM

## 2020-04-09 NOTE — Progress Notes (Signed)
Inpatient [facility] Individual Statement of Services  Patient Name:  Jack [name]  Date:  04/09/2020  Welcome to the [facility].  Our goal is to provide you with an individualized program based on your diagnosis and situation, designed to meet your specific needs.  With this comprehensive rehabilitation program, you will be expected to participate in at least 3 hours of rehabilitation therapies Monday-Friday, with modified therapy programming on the weekends.  Your rehabilitation program will include the following services:  Physical Therapy (PT), Occupational Therapy (OT), Speech Therapy (ST), 24 hour per day rehabilitation nursing, Therapeutic Recreaction (TR), Neuropsychology, Care Coordinator, Rehabilitation Medicine, Nutrition Services, Pharmacy Services and Other  Weekly team conferences will be held on Wednesdays to discuss your progress.  Your Inpatient Rehabilitation Care Coordinator will talk with you frequently to get your input and to update you on team discussions.  Team conferences with you and your family in attendance may also be held.  Expected length of stay: 2-3 Weeks  Overall anticipated outcome: Min A  Depending on your progress and recovery, your program may change. Your Inpatient Rehabilitation Care Coordinator will coordinate services and will keep you informed of any changes. Your Inpatient Rehabilitation Care Coordinator's name and contact numbers are listed  below.  The following services may also be recommended but are not provided by the [facility]:    [name] will be made to provide these services after discharge if needed.  Arrangements include referral to agencies that provide these services.  Your insurance has been verified to be:  [facility] Medicare Your primary doctor is:  Nche, Charlene Brooke, NP  Pertinent information will be  shared with your doctor and your insurance company.  Inpatient Rehabilitation Care Coordinator:  Erlene Quan, [place] or [phone]  Information discussed with and copy given to patient by: [name], 04/09/2020, 8:51 AM

## 2020-04-09 NOTE — Evaluation (Signed)
Speech Language Pathology Assessment and Plan  Patient Details  Name: BLADEN UMAR MRN: [id number] Date of Birth: [birth year]  SLP Diagnosis: Cognitive Impairments;Speech and Language deficits;Dysphagia  Rehab Potential: Fair ELOS: 2-2.5 weeks    Today's Date: 04/09/2020 SLP Individual Time: 8032-1224 SLP Individual Time Calculation (min): 56 min   Hospital Problem: Principal Problem:   ICH (intracerebral hemorrhage) (HCC) - L caudate head w/ IVH and SAH  Past Medical History:  Past Medical History:  Diagnosis Date  . Acute CVA (cerebrovascular accident) ([place]) 07/05/2016  . Atrial fibrillation ([place])    Dr. Tollie Eth  . BPH (benign prostatic hypertrophy) 2012   prior therapy with finasteride, but as of 03/2015 no c/o; Dr. Luanne Bras  . Cancer ([place])   . Cellulitis of face 11/08   [facility]  . CVA (cerebral infarction) 2009, 2015  . Echocardiogram abnormal 02/11/2010   LVEF 70%, severe mitral regurgitation, pulm HTN   . ED (erectile dysfunction)    Viagra prn, consult with Urology 2012  . Elevated PSA 2012   BPH; Dr. Luanne Bras, [facility]; no more PSAs needed as of 2012 consult  . Gait abnormality    uses cane, s/p CVA  . Hemiparesis affecting left side as late effect of cerebrovascular accident ([place])   . Hiatal hernia    per 2008 EUS  . Hyperlipidemia   . Hypertension   . Microscopic hematuria    chronic, eval 2012 with Urology, benign at that time  . Obstructive uropathy 2012   Dr. Luanne Bras  . Pancreatic mass 01/2006   inconsequential per CT 2007  . 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 Patent foramen ovale    hx/o, notation of fistula on echocardiogram from 2009  . Prophylactic antibiotic 2016   requires due to valve disease  . Schatzki's ring 2008   per endoscopic ultrasound; Dr. Janetta Hora. [name]  . Stroke ([place])   . Ventral hernia    surgery consult 01/2010 with Dr. Excell Seltzer, elective repair if desired , but he declined  . Wears hearing aid    Past Surgical History:  Past Surgical History:  Procedure Laterality Date  . CARDIAC CATHETERIZATION  02/2010   LVEF 70%, severe mitral regurgitation, pulm HTN; prior in 01/2006  . INGUINAL HERNIA REPAIR     bilat  . MITRAL VALVE REPAIR  01/2006   mitral repair with Maze procedure, tricuspid annuloplasty, MAZE and LAA oversewn by Dr. Roxy Manns    Assessment / Plan / Recommendation Clinical Impression   HPI:  JERMAL DISMUKE is an 86 year old male with history of L-MCA infarct with residual left-HP, A. fib--no anticoagulation due to prior ICH, Schatzki's ring, vascular dementia (recent MMSE 15/30); who was admitted on 04/02/2020 with low-grade fever and worsening of confusion.  CT head done showing suspicion of hemorrhage from left basal ganglia and left caudate region felt to be hypertensive in nature.  He was febrile and tachycardic with positive UA at admission and treated with Rocephin briefly due to concerns of UTI.  Cleviprex added for BP control and ASA discontinued.  ICH/IVH felt to be due to small vessel disease with hypertension and lead to worsening of confusion.  2D echo done showing EF 65 to 70% with no source of emboli.  Hospital course significant for shortness of breath due to fluid overload treated with IV diuresis x2.  Follow-up chest x-ray shows minimal atelectasis left base--patient has refused nebulizers.  He has defervesced and leukocytosis resolving.  Statin on hold due to bleed.  Follow-up CT head showed slight decrease in left basal ganglia hemorrhage and slight increase in IVH without hydrocephalus.  Swallow evaluation done and patient placed on dysphagia 1, thin liquids.  Therapy ongoing with lethargy resolving but patient continues to have balance deficits with left alteral lean and tendency to lose balance to the left, shuffling gait as well as difficulty with ADLs.  Pt was admitted to CIR 04/08/20 and  SLP evaluations were completed 04/09/20 with results as follows:  Bedside Swallow Evaluation: Pt presents with moderate mainly oral phase dysphagia, although per last MBSS, mild pharyngeal deficits marked by transient penetration of thins. Pt's oral manipulation of purees was limited and weak, resulting in slightly prolonged oral transit of POs. Dys 2 trials resulted in even more prolonged transit and mastication, although oral clearance was complete. No overt s/sx aspiration noted across solid or thin liquid textures. Recommend pt continue current Dys 1 (puree) texture diet with thin liquids, medications crushed and full supervision during meals to ensure use of swallow strategies and precautions.   Cognitive-linguistic evaluation: Although no family was present to confirm baseline, per chart review, pt has hx of previous CVAs and vascular dementia, resulting in cognitive impairments. Today pt presents with severe cognitive impairments that are impacting his functional status most significant for reduced attention, problem solving, recall, and comprehension. He is hard of hearing, and although L hearing aid found at bedside, it was not working (battery likely dead). SLP provided voice amplifier, which did assist with functional communication and some degree of comprehension of directions during session. Recommend using this device or hearing aids if function restored during all communication exchanges with pt. Pt's expressive language appears WFL, although comprehension still impaired despite use of voice amplifier and verbal responses can also be limited by his cognitive deficits. His speech was fluent and fully intelligible throughout session. He demonstrated inconsistent ability ot follow basic 1-step directions and answer basic questions. He was oriented to self only and answered "yes" to many questions. His safety and emergent awareness were also noted to be impaired.  Given functional cognitive deficits  impacting daily function, safety, and ability to carryover therapy techniques in CIR, recommend pt receive skilled ST services for dysphagia and cognitive-linguistic function while inpatient in order to maximize safety, functional independence, and rehab potential prior to discharge.    Skilled Therapeutic Interventions          Bedside swallow and cognitive-linguistic evaluations were administered and results were reviewed with pt (please see above for details regarding results).   SLP Assessment  Patient will need skilled Speech Lanaguage Pathology Services during CIR admission    Recommendations  SLP Diet Recommendations: Dysphagia 1 (Puree);Thin Medication Administration: Crushed with puree Supervision: Patient able to self feed;Full supervision/cueing for compensatory strategies Compensations: Slow rate;Small sips/bites;Follow solids with liquid;Minimize environmental distractions Postural Changes and/or Swallow Maneuvers: Seated upright 90 degrees;Upright 30-60 min after meal Oral Care Recommendations: Oral care BID Patient destination: Home Follow up Recommendations: 24 hour supervision/assistance;Home Health SLP Equipment Recommended: None recommended by SLP    SLP Frequency 3 to 5 out of 7 days   SLP Duration  SLP Intensity  SLP Treatment/Interventions 2-2.5 weeks  Minumum of 1-2 x/day, 30 to 90 minutes  Cognitive remediation/compensation;Cueing hierarchy;Dysphagia/aspiration precaution training;Functional tasks;Patient/family education;Internal/external aids    Pain Pain Assessment Pain Scale: 0-10 Pain Score: 0-No pain  Prior Functioning Type of Home: House  Lives With: Spouse Available  Help at Discharge: Family;Available 24 hours/day Vocation: Retired  Programmer, systems Overall Cognitive Status: No family/caregiver present to determine baseline cognitive functioning (Per chart review pt with some baseline deficits) Arousal/Alertness:  Awake/alert Orientation Level: Oriented to person Attention: Sustained Sustained Attention: Impaired Sustained Attention Impairment: Verbal basic;Functional basic Memory: Impaired Memory Impairment: Decreased short term memory;Retrieval deficit;Decreased long term memory;Decreased recall of new information Decreased Long Term Memory: Verbal basic Decreased Short Term Memory: Verbal basic;Functional basic Awareness: Impaired Awareness Impairment: Emergent impairment Problem Solving: Impaired Problem Solving Impairment: Functional basic;Verbal basic Executive Function:  (all impaired due to lower level deficits) Safety/Judgment: Impaired  Comprehension Auditory Comprehension Overall Auditory Comprehension: Impaired (but also likely impacted by hearing loss and working memory deficits) Yes/No Questions: Within Functional Limits Commands: Impaired One Step Basic Commands: 50-74% accurate Conversation: Simple Interfering Components: Attention;Hearing;Working memory;Processing speed EffectiveTechniques: Increased volume;Repetition;Visual/Gestural cues Environmental consultant Discrimination: Not tested Reading Comprehension Reading Status: Not tested Expression Expression Primary Mode of Expression: Verbal Verbal Expression Overall Verbal Expression: Appears within functional limits for tasks assessed Written Expression Written Expression: Not tested Oral Motor Oral Motor/Sensory Function Overall Oral Motor/Sensory Function: Mild impairment Facial ROM: Reduced left Facial Symmetry: Abnormal symmetry left Facial Strength: Within Functional Limits Lingual Symmetry: Other (Comment) (UTA) Lingual Strength: Reduced Mandible: Within Functional Limits Motor Speech Overall Motor Speech: Appears within functional limits for tasks assessed Phonation: Normal Intelligibility: Intelligible Motor Planning: Witnin functional limits Motor Speech Errors: Not applicable  Care  Tool Care Tool Cognition Expression of Ideas and Wants Expression of Ideas and Wants: Frequent difficulty - frequently exhibits difficulty with expressing needs and ideas   Understanding Verbal and Non-Verbal Content Understanding Verbal and Non-Verbal Content: Sometimes understands - understands only basic conversations or simple, direct phrases. Frequently requires cues to understand   Memory/Recall Ability *first 3 days only Memory/Recall Ability *first 3 days only: None of the above were recalled     PMSV Assessment  PMSV Trial Intelligibility: Intelligible  Bedside Swallowing Assessment General Date of Onset: 04/02/20 Previous Swallow Assessment: MBS 04/04/20 Diet Prior to this Study: Thin liquids;Dysphagia 1 (puree) Temperature Spikes Noted: No Respiratory Status: Room air History of Recent Intubation: No Behavior/Cognition: Alert;Cooperative;Requires cueing Oral Cavity - Dentition: Adequate natural dentition Self-Feeding Abilities: Needs assist;Needs set up;Able to feed self Patient Positioning: Upright in bed Baseline Vocal Quality: Normal Volitional Cough: Cognitively unable to elicit Volitional Swallow: Unable to elicit  Oral Care Assessment Does patient have any of the following "high(er) risk" factors?: None of the above Does patient have any of the following "at risk" factors?: Other - dysphagia Patient is AT RISK: Order set for Adult Oral Care Protocol initiated -  "At Risk Patients" option selected (see row information) Patient is LOW RISK: Follow universal precautions (see row information) Ice Chips Ice chips: Within functional limits Thin Liquid Thin Liquid: Within functional limits Nectar Thick Nectar Thick Liquid: Impaired Honey Thick Honey Thick Liquid: Impaired Puree Puree: Impaired Presentation: Spoon Oral Phase Impairments: Poor awareness of bolus;Reduced lingual movement/coordination Solid Solid: Impaired Presentation: Spoon Oral Phase  Impairments: Reduced lingual movement/coordination;Impaired mastication Oral Phase Functional Implications: Impaired mastication;Prolonged oral transit BSE Assessment Suspected Esophageal Findings Suspected Esophageal Findings: Belching Risk for Aspiration Impact on safety and function: Mild aspiration risk Other Related Risk Factors: Previous CVA;History of esophageal-related issues;Cognitive impairment  Short Term Goals: Week 1: SLP Short Term Goal 1 (Week 1): Pt will demonstrate efficient mastication and oral clearance and minimal overt s/sx aspiration of current Dys 1 (puree) thin liquid diet with Min  A verbal/visual cues for use of compensatory swallow strategies. SLP Short Term Goal 2 (Week 1): Pt will demonstrate efficient mastication and oral clearance of Dys 2 solid textures X3 prior to diet advancement. SLP Short Term Goal 3 (Week 1): Pt will sustain attention to functional tasks with Mod A verbal and visual cues for redirection. SLP Short Term Goal 4 (Week 1): Pt will demonstrate ability to problem solve basic familiar functional tasks with Max A verbal/visual cues. SLP Short Term Goal 5 (Week 1): Pt will recall 2 safety precautions with Max A verbal/visual cues for use of strategies/aids for recall. SLP Short Term Goal 6 (Week 1): Pt will orient to place, situation, and year with Max A for use of aids/recall.  Refer to Care Plan for Long Term Goals  Recommendations for other services: None   Discharge Criteria: Patient will be discharged from SLP if patient refuses treatment 3 consecutive times without medical reason, if treatment goals not met, if there is a change in medical status, if patient makes no progress towards goals or if patient is discharged from hospital.  The above assessment, treatment plan, treatment alternatives and goals were discussed and mutually agreed upon: No family available/patient unable  Arbutus Leas 04/09/2020, 12:15 PM

## 2020-04-09 NOTE — Progress Notes (Signed)
[facility] PHYSICAL MEDICINE & REHABILITATION PROGRESS NOTE   Subjective/Complaints:  Per Nursing slept ok Difficult to awaken this am   ROS- limited by cognition  Objective:   DG CHEST PORT 1 VIEW  Result Date: 04/07/2020 CLINICAL DATA:  Wheezing EXAM: PORTABLE CHEST 1 VIEW COMPARISON:  04/04/2020, 04/03/2020 FINDINGS: Post sternotomy changes and valve prosthesis. Mild atelectasis at the left base and CP angle. No consolidation, pleural effusion, or edema. Aortic atherosclerosis. No pneumothorax. Enteral contrast within the colon. Stable borderline cardiomegaly. IMPRESSION: Minimal atelectasis at the left base and CP angle. Electronically Signed   By: Donavan Foil M.D.   On: 04/07/2020 17:14   Recent Labs    04/07/20 0338 04/08/20 0415  WBC 7.7 9.9  HGB 11.6* 12.6*  HCT 37.5* 40.1  PLT 270 298   Recent Labs    04/07/20 0338 04/08/20 0415  NA 144 145  K 4.5 3.7  CL 108 107  CO2 26 28  GLUCOSE 114* 131*  BUN 20 18  CREATININE 0.80 0.77  CALCIUM 8.6* 9.4    Intake/Output Summary (Last 24 hours) at 04/09/2020 0648 Last data filed at 04/08/2020 1850 Gross per 24 hour  Intake 0 ml  Output --  Net 0 ml        Physical Exam: Vital Signs Blood pressure (!) 101/55, pulse 91, temperature 97.7 F (36.5 C), temperature source Oral, resp. rate 18, weight 61.6 kg, SpO2 94 %.   General: No acute distress Mood and affect are appropriate Heart: Regular rate and rhythm no rubs murmurs or extra sounds Lungs: Clear to auscultation, breathing unlabored, no rales or wheezes Abdomen: Positive bowel sounds, soft nontender to palpation, nondistended Extremities: No clubbing, cyanosis, or edema Skin: No evidence of breakdown, no evidence of rash Neurologic: Cranial nerves II through XII intact, motor strength is 4/5 in RIght 3- Left  deltoid, bicep, tricep, grip,3- Left and 4- right  hip flexor, knee extensors, ankle dorsiflexor and plantar flexor Sensory exam normal sensation to  light touch and proprioception in bilateral upper and lower extremities Cerebellar exam normal finger to nose to finger as well as heel to shin in bilateral upper and lower extremities Musculoskeletal: Full range of motion in all 4 extremities. No joint swelling   Assessment/Plan: 1. Functional deficits secondary to Left caudate ICH  which require 3+ hours per day of interdisciplinary therapy in a comprehensive inpatient rehab setting.  Physiatrist is providing close team supervision and 24 hour management of active medical problems listed below.  Physiatrist and rehab team continue to assess barriers to discharge/monitor patient progress toward functional and medical goals  Care Tool:  Bathing              Bathing assist       Upper Body Dressing/Undressing Upper body dressing   What is the patient wearing?: Hospital gown only    Upper body assist Assist Level: Dependent - Patient 0%    Lower Body Dressing/Undressing Lower body dressing      What is the patient wearing?: Incontinence brief     Lower body assist Assist for lower body dressing: Dependent - Patient 0%     Toileting Toileting    Toileting assist       Transfers Chair/bed transfer  Transfers assist           Locomotion Ambulation   Ambulation assist              Walk 10 feet activity   Assist  Walk 50 feet activity   Assist           Walk 150 feet activity   Assist           Walk 10 feet on uneven surface  activity   Assist           Wheelchair     Assist               Wheelchair 50 feet with 2 turns activity    Assist            Wheelchair 150 feet activity     Assist          Blood pressure (!) 101/55, pulse 91, temperature 97.7 F (36.5 C), temperature source Oral, resp. rate 18, weight 61.6 kg, SpO2 94 %.  Medical Problem List and Plan: 1.  Impaired mobility and ADLs secondary to L caudate ICH- prior  RIght MCA infarct              -patient may shower             -ELOS/Goals: 2-3 weeks MinA but may be lower level based on cognition and arousal 2.  Antithrombotics: -DVT/anticoagulation:  Pharmaceutical: Heparin             -antiplatelet therapy: N/A 3. Pain Management: N/A 4. Mood: LCSW to follow for evaluation and support             -antipsychotic agents: N/AA 5. Neuropsych: This patient is not capable of making decisions on his own behalf. Check UA C and S to see if concommitant UTI 6. Skin/Wound Care:  Routine pressure relief measures. 7. Fluids/Electrolytes/Nutrition: Monitor I/O.  8. H/o CVA with spastic L- HP: No AC due to ICH/IVH. Statin on hold due to bleed.  9.  HTN: Monitor blood pressures 3 times a day.  SBP goal <160. Continue amlodipine twice daily and Avapro daily. BP controlled.  Vitals:   04/08/20 2010 04/09/20 0504  BP:  (!) 101/55  Pulse: 91 91  Resp:  18  Temp:  97.7 F (36.5 C)  SpO2: 94% 94%  Monitor for orthostasis 10.  Schatzki's ring/Dysphagia: Continue dysphagia 1 with thin liquids.  Offer supplements between meals. 11. Vascular dementia: On Aricept and Seroquel at bedtime. Poor cooperation with exam , reduced arousal trial methylphenidate  12.  Prediabetes: Hgb A1C- 5.7. Monitor fasting BS with intermittent labs. Relatively well controlled.  13.  A fib: Rate controlled. Will monitor HR tid--off ASA due to recurrent ICH. Approaching 102 on 9/28.    LOS: 1 days A FACE TO FACE EVALUATION WAS PERFORMED  Charlett Blake 04/09/2020, 6:48 AM

## 2020-04-10 ENCOUNTER — Inpatient Hospital Stay (HOSPITAL_COMMUNITY): Payer: Medicare Other | Admitting: Speech Pathology

## 2020-04-10 ENCOUNTER — Inpatient Hospital Stay (HOSPITAL_COMMUNITY): Payer: Medicare Other | Admitting: Physical Therapy

## 2020-04-10 ENCOUNTER — Inpatient Hospital Stay (HOSPITAL_COMMUNITY): Payer: Medicare Other | Admitting: Occupational Therapy

## 2020-04-10 LAB — URINALYSIS, ROUTINE W REFLEX MICROSCOPIC
Bilirubin Urine: NEGATIVE
Glucose, UA: NEGATIVE mg/dL
Ketones, ur: NEGATIVE mg/dL
Leukocytes,Ua: NEGATIVE
Nitrite: NEGATIVE
Protein, ur: NEGATIVE mg/dL
Specific Gravity, Urine: 1.024 (ref 1.005–1.030)
pH: 5 (ref 5.0–8.0)

## 2020-04-10 MED ORDER — NEPRO/CARBSTEADY PO LIQD
237.0000 mL | Freq: Three times a day (TID) | ORAL | Status: DC
  Administered 2020-04-11 – 2020-04-23 (×27): 237 mL via ORAL

## 2020-04-10 MED ORDER — SODIUM CHLORIDE 0.45 % IV SOLN
INTRAVENOUS | Status: DC
  Administered 2020-04-10: 1000 mL via INTRAVENOUS

## 2020-04-10 MED ORDER — SODIUM CHLORIDE 0.45 % IV SOLN: INTRAVENOUS | Status: DC

## 2020-04-10 NOTE — Progress Notes (Signed)
Physical Therapy Session Note  Patient Details  Name: Jack [name] MRN: [id number] Date of Birth: [birth year]  Today's Date: 04/10/2020 PT Individual Time: 1307-1420 PT Individual Time Calculation (min): 73 min   Short Term Goals: Week 1:  PT Short Term Goal 1 (Week 1): Patient will maintain dynamic sitting balance >5 mins with no more than CGA PT Short Term Goal 2 (Week 1): Patient will complete STS with LRAD and no more than MinA PT Short Term Goal 3 (Week 1): Patient will transfer bed <> wc with LRAD and no more than ModA consistently PT Short Term Goal 4 (Week 1): Patient will ambulate >5ft with LRAD and no more than ModA  Skilled Therapeutic Interventions/Progress Updates:    Pt received supine in bed with his wife, [name], present and pt agreeable to therapy session. Upon initiation of movement pt noted to haved labored breathing with wheezing and intermittent productive cough - pt's wife reports this is new - notified Ed, RN - monitored SpO2 throughout session and noted to be >96% with HR averaging 100-108bpm with activity. Supine>sitting L EOB, HOB elevated and using bedrail, with mod assist for trunk upright and scooting to EOB. Sit>stand EOB>RW with mod assist for lifting into standing and min/mod assist for stand pivot to w/c. Throughout session pt improved to performing sit<>stands using RW with min assist. Transported to/from gym in w/c for time management and energy conservation. Gait training ~71ft using RW with min/mod assist for balance and AD management with +2 assist for line management and w/c follow - demos significantly decreased speed of movement with significantly decreased gait speed, decreased B LE step lengths with slight L LE ankle inversion/possible flexor tone noted during swing phase, increased trunk flexion with downward gaze - pt has significant difficulty turning AD and coordinating LE stepping during turns. Dynamic standing balance and B LE strengthening via repeated  R/L LE step up/down on/off 1st 6" step at stairs with B HRs - min assist for lifting/lowering and balance with cuing for increased quad/glute activation. Transitioned to repeated L LE lateral step up/down targeting increased hip abductor activation and L LE coordination with mod assist for lifting/lowering and balance. Dynamic gait training at hallway rail via lateral side stepping min/mod assist for balance and cuing with manual facilitation for weight shifting to promote increased step lengths - continues to have very slow speed of movement. Dynamic standing balance via alternate B LE foot taps on cones then back to external target as pt has excessive L LE adduction causing narrow BOS and L lateral lean - using BUE support on RW and min assist for balance. Gait training ~77ft using RW with min/mod assist and +2 for line management and w/c follow - continues to demo above gait impairments with slight improvement in step lengths noted until pt becomes fatigued at end of walk. Therapist provided patient with hemi-height wheelchair to improve sitting posture and increase OOB activity tolerance. Transported back to room and left seated in w/c with needs in reach and seat belt alarm on.  Therapy Documentation Precautions:  Precautions Precautions: Fall Precaution Comments: monitor SpO2 Restrictions Weight Bearing Restrictions: No  Pain: No reports of pain throughout session.  Therapy/Group: Individual Therapy  Tawana Scale , PT, DPT, CSRS  04/10/2020, 12:45 PM

## 2020-04-10 NOTE — Progress Notes (Signed)
   04/10/20 1705  What Happened  Was fall witnessed? No  Patients activity before fall to/from bed, chair, or stretcher (sitting in Lapel)  Point of contact buttocks  Was patient injured? No  Patient found on floor  Found by  (wife)  Stated prior activity to/from bed, chair, or stretcher (sitting in chair)  Follow Up  MD notified P. Love, PA  Time MD notified 16  Family notified Yes - comment  Time family notified 1705  Additional tests No  Simple treatment Other (comment) (none)  Adult Fall Risk Assessment  Risk Factor Category (scoring not indicated) High fall risk per protocol (document High fall risk)  Patient Fall Risk Level High fall risk  Adult Fall Risk Interventions  Required Bundle Interventions *See Row Information* High fall risk - low, moderate, and high requirements implemented  Additional Interventions Use of appropriate toileting equipment (bedpan, BSC, etc.);Family Supervision  Screening for Fall Injury Risk (To be completed on HIGH fall risk patients) - Assessing Need for Low Bed  Risk For Fall Injury- Low Bed Criteria None identified - Continue screening  Screening for Fall Injury Risk (To be completed on HIGH fall risk patients who do not meet crieteria for Low Bed) - Assessing Need for Floor Mats Only  Risk For Fall Injury- Criteria for Floor Mats None identified - No additional interventions needed  Vitals  BP 110/68  BP Location Right Arm  BP Method Manual  Pulse Rate 86  Pulse Rate Source Right;Radial  Resp 20  Oxygen Therapy  SpO2 100 %  O2 Device Room Air

## 2020-04-10 NOTE — Progress Notes (Signed)
Occupational Therapy Session Note  Patient Details  Name: Jack [name] MRN: [id number] Date of Birth: 1934-06-19  Today's Date: 04/10/2020 OT Individual Time: 0920-1016 OT Individual Time Calculation (min): 56 min    Short Term Goals: Week 1:  OT Short Term Goal 1 (Week 1): Pt will complete LB bathing sit to stand with min assist for 2 consecutive attempts. OT Short Term Goal 2 (Week 1): Pt will complete UB dressing sitting with supervision. OT Short Term Goal 3 (Week 1): Pt will complete toilet transfer with min assist using the RW for support. OT Short Term Goal 4 (Week 1): Pt will consistently follow one step commands related to selfcare tasks with 90% accuracy.  Skilled Therapeutic Interventions/Progress Updates:    Pt worked on bathing and dressing sit to stand at the sink during session.  He needed mod assist with mod instructional cueing for transfer to the EOB.  He then completed transfer with max assist stand pivot to the wheelchair with use of the RW for support.  Once in the wheelchair he was positioned over to the sink for bathing tasks.  Mod demonstrational cueing for thoroughness and initiation of bathing tasks as well as max step by step instructional cueing to complete task.  Decreased ability to reach either foot for washing, requiring therapist assist to cross each foot over the opposite knee and maintain to complete.  Mod assist for sit to stand and for standing balance while at the sink.  He was able to complete dressing with mod assist for UB and max assist overall for LB.  At times when presented with clothing, he was not sure what he was given and instead of donning his shirt, he would try and wipe his hands off with it.  Mod instructional cueing to correct.  Total assist was needed for socks and shoes, which is what his spouse reports that he needed for this at home.  Finished with pt in the wheelchair and spouse assisting him with use of his electric razor at the sink.   Safety alarm belt in place.    Therapy Documentation Precautions:  Precautions Precautions: Fall Precaution Comments: monitor SpO2 Restrictions Weight Bearing Restrictions: No  Pain: Pain Assessment Pain Scale: Faces Pain Score: 0-No pain Faces Pain Scale: No hurt ADL: See Care Tool Section for some details of mobility and selfcare  Therapy/Group: Individual Therapy  Seven [place] OTR/L 04/10/2020, 12:36 PM

## 2020-04-10 NOTE — Progress Notes (Signed)
PHYSICAL MEDICINE & REHABILITATION PROGRESS NOTE   Subjective/Complaints:  Awakens to physical stim, no hearing aides yet  ROS- limited by cognition  Objective:   No results found. Recent Labs    04/08/20 0415 04/09/20 0751  WBC 9.9 9.2  HGB 12.6* 11.9*  HCT 40.1 37.1*  PLT 298 319   Recent Labs    04/08/20 0415 04/09/20 0751  NA 145 147*  K 3.7 4.4  CL 107 110  CO2 28 27  GLUCOSE 131* 103*  BUN 18 25*  CREATININE 0.77 1.01  CALCIUM 9.4 9.0    Intake/Output Summary (Last 24 hours) at 04/10/2020 0737 Last data filed at 04/09/2020 1900 Gross per 24 hour  Intake 440 ml  Output --  Net 440 ml        Physical Exam: Vital Signs Blood pressure 122/67, pulse 88, temperature 98.2 F (36.8 C), temperature source Oral, resp. rate 20, weight 62.7 kg, SpO2 96 %.   General: No acute distress Mood and affect are appropriate Heart: Regular rate and rhythm no rubs murmurs or extra sounds Lungs: Clear to auscultation, breathing unlabored, no rales or wheezes Abdomen: Positive bowel sounds, soft nontender to palpation, nondistended Extremities: No clubbing, cyanosis, or edema Skin: No evidence of breakdown, no evidence of rash Neurologic: Cranial nerves II through XII intact, motor strength is 4/5 in RIght 3- Left  deltoid, bicep, tricep, grip,3- Left and 4- right  hip flexor, knee extensors, ankle dorsiflexor and plantar flexor Sensory exam normal sensation to light touch and proprioception in bilateral upper and lower extremities Cerebellar exam normal finger to nose to finger as well as heel to shin in bilateral upper and lower extremities Musculoskeletal: Full range of motion in all 4 extremities. No joint swelling   Assessment/Plan: 1. Functional deficits secondary to Left caudate ICH  which require 3+ hours per day of interdisciplinary therapy in a comprehensive inpatient rehab setting.  Physiatrist is providing close team supervision and 24 hour  management of active medical problems listed below.  Physiatrist and rehab team continue to assess barriers to discharge/monitor patient progress toward functional and medical goals  Care Tool:  Bathing    Body parts bathed by patient: Right arm, Left arm, Chest, Abdomen, Right upper leg, Left upper leg, Face   Body parts bathed by helper: Right lower leg, Left lower leg, Front perineal area, Buttocks     Bathing assist Assist Level: 2 Helpers     Upper Body Dressing/Undressing Upper body dressing   What is the patient wearing?: Pull over shirt    Upper body assist Assist Level: Minimal Assistance - Patient > 75%    Lower Body Dressing/Undressing Lower body dressing      What is the patient wearing?: Incontinence brief, Pants     Lower body assist Assist for lower body dressing: Maximal Assistance - Patient 25 - 49%     Toileting Toileting    Toileting assist Assist for toileting: 2 Helpers     Transfers Chair/bed transfer  Transfers assist     Chair/bed transfer assist level: Maximal Assistance - Patient 25 - 49%     Locomotion Ambulation   Ambulation assist   Ambulation activity did not occur: Safety/medical concerns (Patient ambulated 2 steps in // bars)          Walk 10 feet activity   Assist  Walk 10 feet activity did not occur: Safety/medical concerns (patient fatigue/weakness)        Walk 50 feet activity  Assist Walk 50 feet with 2 turns activity did not occur: Safety/medical concerns (patient fatigue/weakness)         Walk 150 feet activity   Assist Walk 150 feet activity did not occur: Safety/medical concerns (patient fatigue/weakness)         Walk 10 feet on uneven surface  activity   Assist Walk 10 feet on uneven surfaces activity did not occur: Safety/medical concerns (patient fatigue/weakness)         Wheelchair     Assist Will patient use wheelchair at discharge?: Yes Type of Wheelchair: Manual     Wheelchair assist level: Total Assistance - Patient < 25% Max wheelchair distance: 150    Wheelchair 50 feet with 2 turns activity    Assist        Assist Level: Total Assistance - Patient < 25%   Wheelchair 150 feet activity     Assist      Assist Level: Total Assistance - Patient < 25%   Blood pressure 122/67, pulse 88, temperature 98.2 F (36.8 C), temperature source Oral, resp. rate 20, weight 62.7 kg, SpO2 96 %.  Medical Problem List and Plan: 1.  Impaired mobility and ADLs secondary to L caudate ICH- prior RIght MCA infarct              -patient may shower             -ELOS/Goals: 2-3 weeks MinA but may be lower level based on cognition and arousal UA with Hgb but neg nitrates and few WBCs  2.  Antithrombotics: -DVT/anticoagulation:  Pharmaceutical: Heparin             -antiplatelet therapy: N/A 3. Pain Management: N/A 4. Mood: LCSW to follow for evaluation and support             -antipsychotic agents: N/AA 5. Neuropsych: This patient is not capable of making decisions on his own behalf. Check UA C and S to see if concommitant UTI 6. Skin/Wound Care:  Routine pressure relief measures. 7. Fluids/Electrolytes/Nutrition: Monitor I/O.  8. H/o CVA with spastic L- HP: No AC due to ICH/IVH. Statin on hold due to bleed.  9.  HTN: Monitor blood pressures 3 times a day.  SBP goal <160. Continue amlodipine twice daily and Avapro daily. BP controlled.  Vitals:   04/10/20 0553 04/10/20 0600  BP: 124/75 122/67  Pulse: 84 88  Resp:    Temp:    SpO2:    Monitor for orthostasis- intake fair BUN up from prior , IVF at noc  10.  Schatzki's ring/Dysphagia: Continue dysphagia 1 with thin liquids.  Offer supplements between meals. 11. Vascular dementia: On Aricept and Seroquel at bedtime. Communication limited by aphasia and severe hearing impairment  Poor cooperation with exam , reduced arousal trial methylphenidate  12.  Prediabetes: Hgb A1C- 5.7. Monitor fasting BS  with intermittent labs. Relatively well controlled.  13.  A fib: Rate controlled. Will monitor HR tid--off ASA due to recurrent ICH. Approaching 102 on 9/28.    LOS: 2 days A FACE TO FACE EVALUATION WAS PERFORMED  Charlett Blake 04/10/2020, 7:37 AM

## 2020-04-10 NOTE — Progress Notes (Signed)
Speech Language Pathology Daily Session Note  Patient Details  Name: Jack [name] MRN: [id number] Date of Birth: 06/08/1934  Today's Date: 04/10/2020 SLP Individual Time: 0800-0900 SLP Individual Time Calculation (min): 60 min  Short Term Goals: Week 1: SLP Short Term Goal 1 (Week 1): Pt will demonstrate efficient mastication and oral clearance and minimal overt s/sx aspiration of current Dys 1 (puree) thin liquid diet with Min A verbal/visual cues for use of compensatory swallow strategies. SLP Short Term Goal 2 (Week 1): Pt will demonstrate efficient mastication and oral clearance of Dys 2 solid textures X3 prior to diet advancement. SLP Short Term Goal 3 (Week 1): Pt will sustain attention to functional tasks with Mod A verbal and visual cues for redirection. SLP Short Term Goal 4 (Week 1): Pt will demonstrate ability to problem solve basic familiar functional tasks with Max A verbal/visual cues. SLP Short Term Goal 5 (Week 1): Pt will recall 2 safety precautions with Max A verbal/visual cues for use of strategies/aids for recall. SLP Short Term Goal 6 (Week 1): Pt will orient to place, situation, and year with Max A for use of aids/recall.  Skilled Therapeutic Interventions:   Patient seen for skilled ST session focusing on swallow and cognitive function goals. RN gave patient first dose of ritalin to help with alertness. Patient was asleep in bed upon SLP entering room but awakened without difficulty and alertness and attention improved as session progressed. SLP checked both hearing aids which were operational, but patient still benefits from repetition of questions/commands. Patient consumed approximately 40% of breakfast meal (Dys 1, thin liquids) with intermittent, delayed coughing. Patient observed to hold purees in mouth post swallows but this cleared with sips of thin liquids. Patient able to feed self, but requires maximal cues to initiate to do so. He performed oral care via brushing  his teeth when setup by SLP. Verbalizations were minimal and patient did not spontaneously comment or request. Patient continues to benefit from skilled SLP intervention to maximize cognitive-linguistic and swallow function prior to discharge.  Pain Pain Assessment Pain Scale: Faces Faces Pain Scale: No hurt  Therapy/Group: Individual Therapy  Sonia Baller, MA, CCC-SLP Speech Therapy

## 2020-04-11 ENCOUNTER — Inpatient Hospital Stay (HOSPITAL_COMMUNITY): Payer: Medicare Other | Admitting: Occupational Therapy

## 2020-04-11 ENCOUNTER — Inpatient Hospital Stay (HOSPITAL_COMMUNITY): Payer: Medicare Other | Admitting: Physical Therapy

## 2020-04-11 ENCOUNTER — Inpatient Hospital Stay (HOSPITAL_COMMUNITY): Payer: Medicare Other | Admitting: Speech Pathology

## 2020-04-11 LAB — URINE CULTURE

## 2020-04-11 LAB — GLUCOSE, CAPILLARY: Glucose-Capillary: 89 mg/dL (ref 70–99)

## 2020-04-11 NOTE — Progress Notes (Signed)
[place] PHYSICAL MEDICINE & REHABILITATION PROGRESS NOTE   Subjective/Complaints:  Brighter this am eating breakfast, IVF finishing up from last noc   ROS- limited by cognition  Objective:   No results found. Recent Labs    04/09/20 0751  WBC 9.2  HGB 11.9*  HCT 37.1*  PLT 319   Recent Labs    04/09/20 0751  NA 147*  K 4.4  CL 110  CO2 27  GLUCOSE 103*  BUN 25*  CREATININE 1.01  CALCIUM 9.0    Intake/Output Summary (Last 24 hours) at 04/11/2020 0750 Last data filed at 04/11/2020 0600 Gross per 24 hour  Intake 911.49 ml  Output 100 ml  Net 811.49 ml        Physical Exam: Vital Signs Blood pressure 134/74, pulse 74, temperature 99.1 F (37.3 C), temperature source Axillary, resp. rate 20, weight 64.3 kg, SpO2 99 %.    General: No acute distress Mood and affect are appropriate Heart: Regular rate and rhythm no rubs murmurs or extra sounds Lungs: Clear to auscultation, breathing unlabored, no rales or wheezes Abdomen: Positive bowel sounds, soft nontender to palpation, nondistended Extremities: No clubbing, cyanosis, or edema Skin: No evidence of breakdown, no evidence of rash IV site Left forearm CDI    Neurologic: Cranial nerves II through XII intact, motor strength is 4/5 in RIght 3- Left  deltoid, bicep, tricep, grip,3- Left and 4- right  hip flexor, knee extensors, ankle dorsiflexor and plantar flexor Sensory exam normal sensation to light touch and proprioception in bilateral upper and lower extremities Cerebellar exam normal finger to nose to finger as well as heel to shin in bilateral upper and lower extremities Musculoskeletal: Full range of motion in all 4 extremities. No joint swelling   Assessment/Plan: 1. Functional deficits secondary to Left caudate ICH  which require 3+ hours per day of interdisciplinary therapy in a comprehensive inpatient rehab setting.  Physiatrist is providing close team supervision and 24 hour management of active  medical problems listed below.  Physiatrist and rehab team continue to assess barriers to discharge/monitor patient progress toward functional and medical goals  Care Tool:  Bathing    Body parts bathed by patient: Right arm, Left arm, Chest, Abdomen, Right upper leg, Left upper leg, Face   Body parts bathed by helper: Right lower leg, Left lower leg, Front perineal area, Buttocks     Bathing assist Assist Level: Maximal Assistance - Patient 24 - 49%     Upper Body Dressing/Undressing Upper body dressing   What is the patient wearing?: Pull over shirt    Upper body assist Assist Level: Moderate Assistance - Patient 50 - 74%    Lower Body Dressing/Undressing Lower body dressing      What is the patient wearing?: Incontinence brief     Lower body assist Assist for lower body dressing: Maximal Assistance - Patient 25 - 49%     Toileting Toileting    Toileting assist Assist for toileting: 2 Helpers     Transfers Chair/bed transfer  Transfers assist     Chair/bed transfer assist level: Moderate Assistance - Patient 50 - 74% Chair/bed transfer assistive device: Armrests, Programmer, multimedia   Ambulation assist   Ambulation activity did not occur: Safety/medical concerns (Patient ambulated 2 steps in // bars)  Assist level: 2 helpers (mod A w/ +2 for line management) Assistive device: Walker-rolling Max distance: 32ft   Walk 10 feet activity   Assist  Walk 10 feet activity did  not occur: Safety/medical concerns (patient fatigue/weakness)  Assist level: 2 helpers (mod A w/ +2 for line management) Assistive device: Walker-rolling   Walk 50 feet activity   Assist Walk 50 feet with 2 turns activity did not occur: Safety/medical concerns (patient fatigue/weakness)  Assist level: 2 helpers (mod A w/ +2 for line management) Assistive device: Walker-rolling    Walk 150 feet activity   Assist Walk 150 feet activity did not occur:  Safety/medical concerns (patient fatigue/weakness)         Walk 10 feet on uneven surface  activity   Assist Walk 10 feet on uneven surfaces activity did not occur: Safety/medical concerns (patient fatigue/weakness)         Wheelchair     Assist Will patient use wheelchair at discharge?: Yes Type of Wheelchair: Manual    Wheelchair assist level: Total Assistance - Patient < 25% Max wheelchair distance: 150    Wheelchair 50 feet with 2 turns activity    Assist        Assist Level: Total Assistance - Patient < 25%   Wheelchair 150 feet activity     Assist      Assist Level: Total Assistance - Patient < 25%   Blood pressure 134/74, pulse 74, temperature 99.1 F (37.3 C), temperature source Axillary, resp. rate 20, weight 64.3 kg, SpO2 99 %.  Medical Problem List and Plan: 1.  Impaired mobility and ADLs secondary to L caudate ICH- prior RIght MCA infarct              -patient may shower             -ELOS/Goals: 2-3 weeks MinA but may be lower level based on cognition and arousal UA with Hgb but neg nitrates and few WBCs Ucx pending  2.  Antithrombotics: -DVT/anticoagulation:  Pharmaceutical: Heparin             -antiplatelet therapy: N/A 3. Pain Management: N/A 4. Mood: LCSW to follow for evaluation and support             -antipsychotic agents: N/AA 5. Neuropsych: This patient is not capable of making decisions on his own behalf. Check UA C and S to see if concommitant UTI 6. Skin/Wound Care:  Routine pressure relief measures. 7. Fluids/Electrolytes/Nutrition: Monitor I/O.  8. H/o CVA with spastic L- HP: No AC due to ICH/IVH. Statin on hold due to bleed.  9.  HTN: Monitor blood pressures 3 times a day.  SBP goal <160. Continue amlodipine twice daily and Avapro daily. BP controlled.  Vitals:   04/10/20 2200 04/11/20 0540  BP:  134/74  Pulse:  74  Resp: 18 20  Temp: 98.5 F (36.9 C) 99.1 F (37.3 C)  SpO2:    Monitor for orthostasis- intake  fair BUN up from prior , IVF at noc  10.  Schatzki's ring/Dysphagia: Continue dysphagia 1 with thin liquids.  Offer supplements between meals. 11. Vascular dementia: On Aricept and Seroquel at bedtime. Communication limited by aphasia and severe hearing impairment  Poor cooperation with exam , reduced arousal trial methylphenidate  12.  Prediabetes: Hgb A1C- 5.7. Monitor fasting BS with intermittent labs. Relatively well controlled.  13.  A fib: Rate controlled. Will monitor HR tid--off ASA due to recurrent ICH. Rate improved 10/01   LOS: 3 days A FACE TO FACE EVALUATION WAS PERFORMED  Charlett Blake 04/11/2020, 7:50 AM

## 2020-04-11 NOTE — Progress Notes (Signed)
Speech Language Pathology Daily Session Note  Patient Details  Name: Jack [name] MRN: [id number] Date of Birth: 12-Nov-1933  Today's Date: 04/11/2020 SLP Individual Time: 0800-0900 SLP Individual Time Calculation (min): 60 min  Short Term Goals: Week 1: SLP Short Term Goal 1 (Week 1): Pt will demonstrate efficient mastication and oral clearance and minimal overt s/sx aspiration of current Dys 1 (puree) thin liquid diet with Min A verbal/visual cues for use of compensatory swallow strategies. SLP Short Term Goal 2 (Week 1): Pt will demonstrate efficient mastication and oral clearance of Dys 2 solid textures X3 prior to diet advancement. SLP Short Term Goal 3 (Week 1): Pt will sustain attention to functional tasks with Mod A verbal and visual cues for redirection. SLP Short Term Goal 4 (Week 1): Pt will demonstrate ability to problem solve basic familiar functional tasks with Max A verbal/visual cues. SLP Short Term Goal 5 (Week 1): Pt will recall 2 safety precautions with Max A verbal/visual cues for use of strategies/aids for recall. SLP Short Term Goal 6 (Week 1): Pt will orient to place, situation, and year with Max A for use of aids/recall.  Skilled Therapeutic Interventions:   Patient seen for skilled ST session focusing on swallow function and cognitive-linguistic goals. Wife present in room who reports he had a few instances of "fighting" in his sleep but otherwise seemed to sleep well. She has been pleased with his progress so far. Patient alert and able to pick up spoon and cup to feed self but needed maximal verbal cues to initiate with SLP telling him, "Ive got another bite ready for you". Patient consumed 70% of puree solids without coughing, throat clearing or overt s/s that would be concerning for aspiration or penetration. He continues to masticate with puree solids and oral transit is prolonged.  When asked questions about his personal history, he would consistently ask wife instead  of attempting to respond. SLP provided biographical questionnaire for wife to complete for use in later therapy sessions. Patient was able to perform more tasks during today's session and more consistently alert, but requires maximal verbal, visual, tactile cues to initiate each time. He continues to benefit from skilled ST to maximize cognitive-linguistic and swallow function prior to discharge.  Pain Pain Assessment Pain Scale: Faces Pain Score: 0-No pain Faces Pain Scale: No hurt  Therapy/Group: Individual Therapy  Sonia Baller, MA, CCC-SLP 04/11/20 2:01 PM

## 2020-04-11 NOTE — IPOC Note (Signed)
Overall Plan of Care [facility]) Patient Details Name: Jack [name] MRN: [id number] DOB: 1933/12/14  Admitting Diagnosis: ICH (intracerebral hemorrhage) [facility])  Hospital Problems: Principal Problem:   ICH (intracerebral hemorrhage) (HCC) - L caudate head w/ IVH and SAH     Functional Problem List: Nursing Bladder, Bowel, Endurance, Medication Management, Safety  PT Balance, Endurance, Motor, Perception, Safety, Sensory  OT Balance, Cognition, Safety, Motor, Endurance  SLP Cognition, Nutrition  TR         Basic ADL's: OT Eating, Grooming, Bathing, Dressing, Toileting     Advanced  ADL's: OT       Transfers: PT Bed Mobility, Bed to Chair, Car, Manufacturing systems engineer, Metallurgist: PT Ambulation, Emergency planning/management officer, Stairs     Additional Impairments: OT Fuctional Use of Upper Extremity  SLP Swallowing, Social Cognition, Communication comprehension Problem Solving, Memory, Attention, Awareness  TR      Anticipated Outcomes Item Anticipated Outcome  Self Feeding independent  Swallowing      Basic self-care  supervision to min assist  Toileting  min guard   Bathroom Transfers min guard  Bowel/Bladder  manage bowel and bladder with min assist  Transfers  grossly MinA  Locomotion  Grossly MinA  Communication     Cognition     Pain  manage pain at or below level 4  Safety/Judgment  maintain safety with min assist   Therapy Plan: PT Intensity: Minimum of 1-2 x/day ,45 to 90 minutes PT Frequency: 5 out of 7 days PT Duration Estimated Length of Stay: 2-2.5 weeks OT Intensity: Minimum of 1-2 x/day, 45 to 90 minutes OT Frequency: 5 out of 7 days OT Duration/Estimated Length of Stay: 12-14 days SLP Intensity: Minumum of 1-2 x/day, 30 to 90 minutes SLP Frequency: 3 to 5 out of 7 days SLP Duration/Estimated Length of Stay: 2-2.5 weeks   Due to the current state of emergency, patients may not be receiving their 3-hours of Medicare-mandated therapy.    Team Interventions: Nursing Interventions Patient/Family Education, Bowel Management, Dysphagia/Aspiration Precaution Training, Discharge Planning, Medication Management, Disease Management/Prevention, Bladder Management  PT interventions Ambulation/gait training, Discharge planning, Functional mobility training, Psychosocial support, Therapeutic Activities, Visual/perceptual remediation/compensation, Balance/vestibular training, Disease management/prevention, Neuromuscular re-education, Skin care/wound management, Therapeutic Exercise, Wheelchair propulsion/positioning, Cognitive remediation/compensation, DME/adaptive equipment instruction, Pain management, Splinting/orthotics, UE/LE Strength taining/ROM, Community reintegration, Technical sales engineer stimulation, Patient/family education, UE/LE Coordination activities, Stair training  OT Interventions Training and development officer, Discharge planning, Pain management, Self Care/advanced ADL retraining, Therapeutic Activities, UE/LE Coordination activities, Cognitive remediation/compensation, Disease mangement/prevention, Functional mobility training, Patient/family education, Therapeutic Exercise, DME/adaptive equipment instruction, Neuromuscular re-education, UE/LE Strength taining/ROM  SLP Interventions Cognitive remediation/compensation, Cueing hierarchy, Dysphagia/aspiration precaution training, Functional tasks, Patient/family education, Internal/external aids  TR Interventions    SW/CM Interventions Discharge Planning, Psychosocial Support, Patient/Family Education   Barriers to Discharge MD  Medical stability  Nursing      PT Home environment access/layout, Incontinence, Other (comments) hx of multiple strokes  OT      SLP      SW       Team Discharge Planning: Destination: PT-Home ,OT- Home , SLP-Home Projected Follow-up: PT-Home health PT, Outpatient PT, OT-  Home health OT, 24 hour supervision/assistance, SLP-24 hour  supervision/assistance, Home Health SLP Projected Equipment Needs: PT-Rolling walker with 5" wheels, Wheelchair (measurements), OT- To be determined, SLP-None recommended by SLP Equipment Details: PT-Patient owns FWW, may need wc (dimensions TBD), OT-  Patient/family involved in discharge planning: PT- Patient,  OT-Patient, Family member/caregiver,  SLP-Patient unable/family or caregive not available  MD ELOS: 2-3wk Medical Rehab Prognosis:  Fair Assessment:  86 year old male with history of L-MCA infarct with residual left-HP, A. fib--no anticoagulation due to prior ICH, Schatzki's ring, vascular dementia (recent MMSE 15/30); who was admitted on 04/02/2020 with low-grade fever and worsening of confusion.  CT head done showing suspicion of hemorrhage from left basal ganglia and left caudate region felt to be hypertensive in nature.  He was febrile and tachycardic with positive UA at admission and treated with Rocephin briefly due to concerns of UTI.  Cleviprex added for BP control and ASA discontinued.  ICH/IVH felt to be due to small vessel disease with hypertension and lead to worsening of confusion.  2D echo done showing EF 65 to 70% with no source of emboli.  Hospital course significant for shortness of breath due to fluid overload treated with IV diuresis x2.  Follow-up chest x-ray shows minimal atelectasis left base--patient has refused nebulizers.  He has defervesced and leukocytosis resolving.  Statin on hold due to bleed.  Follow-up CT head showed slight decrease in left basal ganglia hemorrhage and slight increase in IVH without hydrocephalus.  Swallow evaluation done and patient placed on dysphagia 1, thin liquids.  Therapy ongoing with lethargy resolving but patient continues to have balance deficits with left alteral lean and tendency to lose balance to the left, shuffling gait as well as difficulty with ADLs.  CIR recommended due to functional decline.   See Team Conference Notes for weekly  updates to the plan of care

## 2020-04-11 NOTE — Progress Notes (Signed)
Occupational Therapy Session Note  Patient Details  Name: Jack [name] MRN: [id number] Date of Birth: 04/12/1934  Today's Date: 04/11/2020 OT Individual Time: 0962-8366 OT Individual Time Calculation (min): 32 min    Short Term Goals: Week 1:  OT Short Term Goal 1 (Week 1): Pt will complete LB bathing sit to stand with min assist for 2 consecutive attempts. OT Short Term Goal 2 (Week 1): Pt will complete UB dressing sitting with supervision. OT Short Term Goal 3 (Week 1): Pt will complete toilet transfer with min assist using the RW for support. OT Short Term Goal 4 (Week 1): Pt will consistently follow one step commands related to selfcare tasks with 90% accuracy.  Skilled Therapeutic Interventions/Progress Updates:    Session 1: [phone])  Pt in bed sleeping to start with spouse present.  When asking spouse what happened yesterday when pt was found in the floor, she was unable to recall.  Per nursing note she was the one that found him in the floor, however she doesn't remember him falling.  Had pt transfer to the EOB with min assist and then completed LB dressing tasks.  Max assist for donning pull up pants with total assist for shoes.  He then completed stand pivot transfer to the wheelchair at [facility] assist level.  Had him work on shaving at the sink to finish session.  He needs overall mod assist for thoroughness in sitting.  Therapist cleaned his shaver and had him continue task with his wife supervision at end of session.  Call button in reach with safety belt in place as well.   Session 2: ([phone]) Pt up in the wheelchair to start session with spouse present.  Therapist took him down to the therapy gym for session.  Had him work in standing on standing balance and use of the LUE in the [place].  He was able to stand for three, 1 minute intervals with min guard assist, with the RW for support.  He completed 1 with the RUE with an average of 4 seconds.  The final 2 intervals were  completed 5.5 and 6.6 seconds using the RUE.  He then progressed to working on balance sit to stand from the EOM while engaged in reaching and tossing horseshoes with the LUE.  He needed min assist for standing balance while engaged in intervals.  LOB posteriorly requiring min assist overall without any UE support.  Next, had pt ambulate with the RW out into the hallway with min assist for approximately 25'.  Returned to the room and transferred to the bed with min assist.  He was left with the call button and phone in reach and bed alarm in place.  Pt's spouse in room as well.    Therapy Documentation Precautions:  Precautions Precautions: Fall Precaution Comments: monitor SpO2 Restrictions Weight Bearing Restrictions: No  Pain: Pain Assessment Pain Scale: Faces Pain Score: 0-No pain ADL: See Care Tool Section for some details of mobility and selfcare  Therapy/Group: Individual Therapy  Roni Scow OTR/L 04/11/2020, 12:45 PM

## 2020-04-11 NOTE — Progress Notes (Signed)
Physical Therapy Session Note  Patient Details  Name: Jack [name] MRN: [id number] Date of Birth: 10-18-1933  Today's Date: 04/11/2020 PT Individual Time: 1287-8676 PT Individual Time Calculation (min): 59 min   Short Term Goals: Week 1:  PT Short Term Goal 1 (Week 1): Patient will maintain dynamic sitting balance >5 mins with no more than CGA PT Short Term Goal 2 (Week 1): Patient will complete STS with LRAD and no more than MinA PT Short Term Goal 3 (Week 1): Patient will transfer bed <> wc with LRAD and no more than ModA consistently PT Short Term Goal 4 (Week 1): Patient will ambulate >66ft with LRAD and no more than ModA  Skilled Therapeutic Interventions/Progress Updates:    Pt received sitting in w/c with his wife, [name], present and pt initially not agreeable to therapy session though unable to verbalize why. Pt not oriented to location/situation - therapist reoriented pt and educated him on benefits of skilled physical therapy - with max encouragement from his wife and therapist pt agreeable to session. Sit<>stands with varying CGA/min assist with and without RW - cuing and facilitation for increased anterior trunk lean/weight shift. Gait training ~183ft to main therapy gym using RW with min assist for balance and AD management, +2 w/c follow in event of fatigue - continues to demo impaired L LE coordination during swing with slight flexor tone type movement pattern with ankle inversion and excessive hip adduction (cuing for pt to step towards front L RW wheel) - excessive trunk flexion with possible R rotation bias and significantly decreased gait speed with decreased speed of movement and B LE step lengths - difficulty managing RW during turns requiring increased assist. Dynamic standing balance and L UE NMR task via cross body reaching to place clothespins from low to high surface with pt demonstrating impaired L UE coordination once reaching up and forward - min assist for balance  throughout. Dynamic standing balance and L LE NMR via alternate B LE foot taps on 4" step with R UE support around therapist and min/mod assist for balance - cuing for increased trunk and L hip/knee extension during stance while tapping with R foot. Dynamic standing balance and L LE NMR via L lateral foot taps over hockey stick to/from external targets - min/light mod assist for balance. Therapist wrapped red tape around front L RW bar to provide pt visual target to step towards during ambulation. Gait training ~30ft using RW with min assist and +2 w/c follow in event of fatigue - demos improved L LE step width and length using visual target though requires verbal cues to attend to it - continues to have excessive trunk/hip flexion and decreased gait speed. Transported remainder of distance back to room and left seated in w/c with needs in reach, seat belt alarm on, and pt's wife present.  Therapy Documentation Precautions:  Precautions Precautions: Fall Precaution Comments: monitor SpO2 Restrictions Weight Bearing Restrictions: No  Pain: Denies pain during session.   Therapy/Group: Individual Therapy  Tawana Scale , PT, DPT, CSRS  04/11/2020, 4:02 PM

## 2020-04-12 ENCOUNTER — Inpatient Hospital Stay (HOSPITAL_COMMUNITY): Payer: Medicare Other | Admitting: Physical Therapy

## 2020-04-12 ENCOUNTER — Inpatient Hospital Stay (HOSPITAL_COMMUNITY): Payer: Medicare Other

## 2020-04-12 NOTE — Plan of Care (Signed)
  Problem: RH Wheelchair Mobility Goal: LTG Patient will propel w/c in controlled environment (PT) Description: LTG: Patient will propel wheelchair in controlled environment, # of feet with assist (PT) Outcome: Not Applicable Flowsheets (Taken 04/12/2020 1925) LTG: Pt will propel w/c in controlled environ  assist needed:: (discontinued as pt will be a functional ambulator) -- Note: discontinued as pt will be a functional ambulator   Problem: Sit to Stand Goal: LTG:  Patient will perform sit to stand with assistance level (PT) Description: LTG:  Patient will perform sit to stand with assistance level (PT) Flowsheets (Taken 04/12/2020 1925) LTG: PT will perform sit to stand in preparation for functional mobility with assistance level: (upgraded based on pt progress) Supervision/Verbal cueing Note: upgraded based on pt progress   Problem: RH Bed to Chair Transfers Goal: LTG Patient will perform bed/chair transfers w/assist (PT) Description: LTG: Patient will perform bed to chair transfers with assistance (PT). Flowsheets (Taken 04/12/2020 1925) LTG: Pt will perform Bed to Chair Transfers with assistance level: (upgraded based on pt progress) Supervision/Verbal cueing Note: upgraded based on pt progress   Problem: RH Car Transfers Goal: LTG Patient will perform car transfers with assist (PT) Description: LTG: Patient will perform car transfers with assistance (PT). Flowsheets (Taken 04/12/2020 1925) LTG: Pt will perform car transfers with assist:: (upgraded based on pt progress) Contact Guard/Touching assist Note: upgraded based on pt progress   Problem: RH Furniture Transfers Goal: LTG Patient will perform furniture transfers w/assist (OT/PT) Description: LTG: Patient will perform furniture transfers  with assistance (OT/PT). Flowsheets (Taken 04/12/2020 1925) LTG: Pt will perform furniture transfers with assist:: (upgraded based on pt progress) Supervision/Verbal cueing Note: upgraded  based on pt progress   Problem: RH Ambulation Goal: LTG Patient will ambulate in controlled environment (PT) Description: LTG: Patient will ambulate in a controlled environment, # of feet with assistance (PT). Flowsheets (Taken 04/12/2020 1925) LTG: Pt will ambulate in controlled environ  assist needed:: (upgraded based on pt progress) Supervision/Verbal cueing LTG: Ambulation distance in controlled environment: 65ft using LRAD Note: upgraded based on pt progress Goal: LTG Patient will ambulate in home environment (PT) Description: LTG: Patient will ambulate in home environment, # of feet with assistance (PT). Flowsheets (Taken 04/12/2020 1925) LTG: Pt will ambulate in home environ  assist needed:: (upgraded based on pt progress) Supervision/Verbal cueing LTG: Ambulation distance in home environment: 19ft using LRAD Note: upgraded based on pt progress   Problem: RH Wheelchair Mobility Goal: LTG Patient will propel w/c in home environment (PT) Description: LTG: Patient will propel wheelchair in home environment, # of feet with assistance (PT). Flowsheets (Taken 04/12/2020 1925) LTG: Pt will propel w/c in home environ  assist needed:: (discontinued as pt will be a functional ambulator) -- Note: discontinued as pt will be a functional ambulator

## 2020-04-12 NOTE — Progress Notes (Signed)
[facility] PHYSICAL MEDICINE & REHABILITATION PROGRESS NOTE   Subjective/Complaints: No complaints Watching TV Bright, hard of hearing  ROS- limited by cognition  Objective:   No results found. No results for input(s): WBC, HGB, HCT, PLT in the last 72 hours. No results for input(s): NA, K, CL, CO2, GLUCOSE, BUN, CREATININE, CALCIUM in the last 72 hours.  Intake/Output Summary (Last 24 hours) at 04/12/2020 1055 Last data filed at 04/12/2020 0600 Gross per 24 hour  Intake 912.2 ml  Output --  Net 912.2 ml        Physical Exam: Vital Signs Blood pressure (!) 122/59, pulse 77, temperature 97.6 F (36.4 C), resp. rate 20, weight 65.3 kg, SpO2 97 %.    General: Alert, No apparent distress HEENT: Head is normocephalic, hard of hearing Neck: Supple without JVD or lymphadenopathy Heart: Reg rate and rhythm. No murmurs rubs or gallops Chest: CTA bilaterally without wheezes, rales, or rhonchi; no distress Abdomen: Soft, non-tender, non-distended, bowel sounds positive. Extremities: No clubbing, cyanosis, or edema. Pulses are 2+  Skin: No evidence of breakdown, no evidence of rash IV site Left forearm CDI    Neurologic: Cranial nerves II through XII intact, motor strength is 4/5 in RIght 3- Left  deltoid, bicep, tricep, grip,3- Left and 4- right  hip flexor, knee extensors, ankle dorsiflexor and plantar flexor Sensory exam normal sensation to light touch and proprioception in bilateral upper and lower extremities Cerebellar exam normal finger to nose to finger as well as heel to shin in bilateral upper and lower extremities Musculoskeletal: Full range of motion in all 4 extremities. No joint swelling   Assessment/Plan: 1. Functional deficits secondary to Left caudate ICH  which require 3+ hours per day of interdisciplinary therapy in a comprehensive inpatient rehab setting.  Physiatrist is providing close team supervision and 24 hour management of active medical problems  listed below.  Physiatrist and rehab team continue to assess barriers to discharge/monitor patient progress toward functional and medical goals  Care Tool:  Bathing    Body parts bathed by patient: Right arm, Left arm, Chest, Abdomen, Right upper leg, Left upper leg, Face   Body parts bathed by helper: Right lower leg, Left lower leg, Front perineal area, Buttocks     Bathing assist Assist Level: Maximal Assistance - Patient 24 - 49%     Upper Body Dressing/Undressing Upper body dressing   What is the patient wearing?: Pull over shirt    Upper body assist Assist Level: Moderate Assistance - Patient 50 - 74%    Lower Body Dressing/Undressing Lower body dressing      What is the patient wearing?: Pants     Lower body assist Assist for lower body dressing: Maximal Assistance - Patient 25 - 49%     Toileting Toileting    Toileting assist Assist for toileting: Total Assistance - Patient < 25%     Transfers Chair/bed transfer  Transfers assist     Chair/bed transfer assist level: Minimal Assistance - Patient > 75% Chair/bed transfer assistive device: Walker, Armrests   Locomotion Ambulation   Ambulation assist   Ambulation activity did not occur: Safety/medical concerns (Patient ambulated 2 steps in // bars)  Assist level: 2 helpers (min A with +2 w/c follow) Assistive device: Walker-rolling Max distance: 181ft   Walk 10 feet activity   Assist  Walk 10 feet activity did not occur: Safety/medical concerns (patient fatigue/weakness)  Assist level: 2 helpers (min A with +2 w/c follow) Assistive device: Walker-rolling  Walk 50 feet activity   Assist Walk 50 feet with 2 turns activity did not occur: Safety/medical concerns (patient fatigue/weakness)  Assist level: 2 helpers (min A with +2 w/c follow) Assistive device: Walker-rolling    Walk 150 feet activity   Assist Walk 150 feet activity did not occur: Safety/medical concerns (patient  fatigue/weakness)  Assist level: 2 helpers (min A with +2 w/c follow) Assistive device: Walker-rolling    Walk 10 feet on uneven surface  activity   Assist Walk 10 feet on uneven surfaces activity did not occur: Safety/medical concerns (patient fatigue/weakness)         Wheelchair     Assist Will patient use wheelchair at discharge?: Yes Type of Wheelchair: Manual    Wheelchair assist level: Total Assistance - Patient < 25% Max wheelchair distance: 150    Wheelchair 50 feet with 2 turns activity    Assist        Assist Level: Total Assistance - Patient < 25%   Wheelchair 150 feet activity     Assist      Assist Level: Total Assistance - Patient < 25%   Blood pressure (!) 122/59, pulse 77, temperature 97.6 F (36.4 C), resp. rate 20, weight 65.3 kg, SpO2 97 %.  Medical Problem List and Plan: 1.  Impaired mobility and ADLs secondary to L caudate ICH- prior RIght MCA infarct              -patient may shower             -ELOS/Goals: 2-3 weeks MinA but may be lower level based on cognition and arousal UA with Hgb but neg nitrates and few WBCs Ucx pending   -Continue CIR 2.  Antithrombotics: -DVT/anticoagulation:  Pharmaceutical: Heparin             -antiplatelet therapy: N/A 3. Pain Management: N/A 4. Mood: LCSW to follow for evaluation and support             -antipsychotic agents: N/AA 5. Neuropsych: This patient is not capable of making decisions on his own behalf. UA negative 6. Skin/Wound Care:  Routine pressure relief measures. 7. Fluids/Electrolytes/Nutrition: Monitor I/O.  8. H/o CVA with spastic L- HP: No AC due to ICH/IVH. Statin on hold due to bleed.  9.  HTN: Monitor blood pressures 3 times a day.  SBP goal <160. Continue amlodipine twice daily and Avapro daily. BP controlled.  Vitals:   04/12/20 0604 04/12/20 0816  BP: 124/65 (!) 122/59  Pulse: 77   Resp:    Temp:    SpO2:    Monitor for orthostasis- intake fair BUN up from prior  , IVF at noc   10/2: well controlled 10.  Schatzki's ring/Dysphagia: Continue dysphagia 1 with thin liquids.  Offer supplements between meals. 11. Vascular dementia: On Aricept and Seroquel at bedtime. Communication limited by aphasia and severe hearing impairment  Poor cooperation with exam , reduced arousal trial methylphenidate  12.  Prediabetes: Hgb A1C- 5.7. Monitor fasting BS with intermittent labs. Relatively well controlled.  13.  A fib: Rate controlled. Will monitor HR tid--off ASA due to recurrent ICH. Rate well controlled 10/02  LOS: 4 days A FACE TO FACE EVALUATION WAS PERFORMED  Jack [name] Jack [name] 04/12/2020, 10:55 AM

## 2020-04-12 NOTE — Progress Notes (Signed)
Occupational Therapy Session Note  Patient Details  Name: Jack [name] MRN: [id number] Date of Birth: 02-09-1934  Today's Date: 04/12/2020 OT Individual Time: 4287-6811 and 5726-2035 OT Individual Time Calculation (min): 25 min and 60 minutes    Short Term Goals: Week 1:  OT Short Term Goal 1 (Week 1): Pt will complete LB bathing sit to stand with min assist for 2 consecutive attempts. OT Short Term Goal 2 (Week 1): Pt will complete UB dressing sitting with supervision. OT Short Term Goal 3 (Week 1): Pt will complete toilet transfer with min assist using the RW for support. OT Short Term Goal 4 (Week 1): Pt will consistently follow one step commands related to selfcare tasks with 90% accuracy.  Skilled Therapeutic Interventions/Progress Updates:    Session 1: OT session focused on L-NMR, specifically related to grasping and FM coordination. Pt completed large peg board task, placing 16 pegs with occasional min assist. Pt was unable to grasp from container, requiring OT to present vertically. Completed fingers to thumb opposition 3x each increasing accuracy with repetition. At end of session, pt left sitting in w/c with safety belt donned and all needs in reach.   Session 2: OT session focused on standing balance, functional endurance, and transfers. Pt received sitting in w/c. Engaged in dynamic standing balance with horseshoes 5x with pt remaining in standing for 2-3 min with min/CGA and taking moderate rest breaks between. Practiced functional mobility for short household distances 4x ~15 feet distances with CGA and verbal cues for technique with RW. Pt required mod rest breaks between each. At end of session, pt returned to room and left with safety belt and RN present.   Therapy Documentation Precautions:  Precautions Precautions: Fall Precaution Comments: monitor SpO2 Restrictions Weight Bearing Restrictions: No General:   Vital Signs: Therapy Vitals BP: (!) 122/59 Pain: Pain  Assessment Pain Scale: 0-10 Pain Score: 0-No pain ADL: ADL Eating: Minimal assistance Where Assessed-Eating: Wheelchair Grooming: Minimal assistance Where Assessed-Grooming: Wheelchair Upper Body Bathing: Supervision/safety Where Assessed-Upper Body Bathing: Edge of bed Lower Body Bathing: Dependent Where Assessed-Lower Body Bathing: Edge of bed Upper Body Dressing: Minimal assistance Where Assessed-Upper Body Dressing: Edge of bed Lower Body Dressing: Maximal assistance Where Assessed-Lower Body Dressing: Edge of bed Toileting: Dependent Where Assessed-Toileting: Bedside Commode Toilet Transfer: Maximal assistance Toilet Transfer Method: Arts development officer: Art gallery manager    Praxis   Exercises:   Other Treatments:     Therapy/Group: Individual Therapy  Duayne Cal 04/12/2020, 11:53 AM

## 2020-04-12 NOTE — Progress Notes (Signed)
Physical Therapy Session Note  Patient Details  Name: Jack [name] MRN: [id number] Date of Birth: [birth year]  Today's Date: 04/12/2020 PT Individual Time: 3382-5053 PT Individual Time Calculation (min): 74 min   Short Term Goals: Week 1:  PT Short Term Goal 1 (Week 1): Patient will maintain dynamic sitting balance >5 mins with no more than CGA PT Short Term Goal 2 (Week 1): Patient will complete STS with LRAD and no more than MinA PT Short Term Goal 3 (Week 1): Patient will transfer bed <> wc with LRAD and no more than ModA consistently PT Short Term Goal 4 (Week 1): Patient will ambulate >78ft with LRAD and no more than ModA  Skilled Therapeutic Interventions/Progress Updates:    Pt received supine in bed with his wife, [name], present and pt agreeable to therapy session. Supine>sitting L EOB, HOB partially elevated and using bedrail, with min assist for trunk upright. RN present for medication administration. Sitting EOB without UE support and supervision for safety donned overshirt and shoes max assist for time management. Sit<>stands using RW with CGA/min assist for steadying/balance - occasional posterior lean upon coming to stand. Gait training ~175ft to main therapy gym using RW with +2 w/c follow in event of fatigue - significantly decreased gait speed throughout starting with step-to gait pattern leading with L LE, increased forward trunk flexion repeated cuing for improved upright posture throughout, consistently veering R with RW requiring repeated max cuing to correct and avoid bumping walls, at end when ambulating down more open hallway able to progress to reciprocal stepping pattern with slight increase in gait speed. Stand pivot w/c<>EOM using RW with CGA for steadying - cuing to turn fully with AD prior to sitting. Repeated sit<>stands to/from EOM with B HHA facilitating increased anterior trunk lean. Dynamic standing balance and L LE NMR focusing on stance control via attempting to  step R foot forward and lateral onto 4" and 2" steps with L UE support around therapist but pt unable due to fear of falling and impaired ability to shift weight L and maintain L hip/knee extension due to fear causing him to crouch down and sit back onto mat - transitioned to attempting to step R foot forward/backwards to/from external target with L UE support around therapist and some improvement noted but pt resistance to manual facilitation for L weight shift due to fear of falling - transitioned to using RW for B UE support while stepping R foot fwd/back with pt demonstrating improved L hip/knee extension and improving R step length (pt most comfortable with B UE support on AD allowing increased focus on weight shift and LE stepping). Gait training ~27ft including a turn using RW with CGA/min assist for steadying and AD management - cuing for carryover of increased R LE step length with inconsistent improvement - continues to require repeated cuing/faciltitation for trunk/hip extension, repeated max cuing throughout for AD management to avoid obstacles with pt continuing to veer R. Transported back to room with NT present to assume care of pt and assist with toileting.  Therapy Documentation Precautions:  Precautions Precautions: Fall Precaution Comments: monitor SpO2 Restrictions Weight Bearing Restrictions: No  Pain:   Denies pain during session.  Therapy/Group: Individual Therapy  Tawana Scale , PT, DPT, CSRS  04/12/2020, 7:40 AM

## 2020-04-12 NOTE — Progress Notes (Signed)
Speech Language Pathology Daily Session Note  Patient Details  Name: Jack [name] MRN: [id number] Date of Birth: Jul 18, 1933  Today's Date: 04/12/2020 SLP Individual Time: 6578-4696 SLP Individual Time Calculation (min): 43 min  Short Term Goals: Week 1: SLP Short Term Goal 1 (Week 1): Pt will demonstrate efficient mastication and oral clearance and minimal overt s/sx aspiration of current Dys 1 (puree) thin liquid diet with Min A verbal/visual cues for use of compensatory swallow strategies. SLP Short Term Goal 2 (Week 1): Pt will demonstrate efficient mastication and oral clearance of Dys 2 solid textures X3 prior to diet advancement. SLP Short Term Goal 3 (Week 1): Pt will sustain attention to functional tasks with Mod A verbal and visual cues for redirection. SLP Short Term Goal 4 (Week 1): Pt will demonstrate ability to problem solve basic familiar functional tasks with Max A verbal/visual cues. SLP Short Term Goal 5 (Week 1): Pt will recall 2 safety precautions with Max A verbal/visual cues for use of strategies/aids for recall. SLP Short Term Goal 6 (Week 1): Pt will orient to place, situation, and year with Max A for use of aids/recall.  Skilled Therapeutic Interventions:Skilled ST services focused on cognitive and swallow skills. Pt's wife was present feeding items from lunch tray. SLP facilitated initiation and sustained attention in self feeding task, pt demonstrated ability to continue self feeding in 1 minute intervals with dys 1 textures and in up to 3 minute intervals with trials of dys 2 textures. Pt demonstrated continued prolonged bolus manipulation when consuming dys 1 and dys 2 textures with ability to clear mild oral residue with liquid wash. Pt demonstrated no over s/s aspiration. SLP recommends to continue trials of dys 2 textures with a meal prior to diet upgrade. Pt was left in room with wife, call bell within reach and chair alarm set. SLP recommends to continue skilled  services.     Pain Pain Assessment Pain Score: 0-No pain  Therapy/Group: Individual Therapy  Tamma Brigandi  [facility] 04/12/2020, 4:01 PM

## 2020-04-13 MED ORDER — AMLODIPINE BESYLATE 5 MG PO TABS
5.0000 mg | ORAL_TABLET | Freq: Every day | ORAL | Status: DC
  Administered 2020-04-14 – 2020-04-23 (×10): 5 mg via ORAL
  Filled 2020-04-13 (×10): qty 1

## 2020-04-13 NOTE — Progress Notes (Signed)
[facility] PHYSICAL MEDICINE & REHABILITATION PROGRESS NOTE   Subjective/Complaints: No complaints this morning.  ROS- limited by cognition  Objective:   No results found. No results for input(s): WBC, HGB, HCT, PLT in the last 72 hours. No results for input(s): NA, K, CL, CO2, GLUCOSE, BUN, CREATININE, CALCIUM in the last 72 hours.  Intake/Output Summary (Last 24 hours) at 04/13/2020 0904 Last data filed at 04/13/2020 6734 Gross per 24 hour  Intake 1408.06 ml  Output 300 ml  Net 1108.06 ml    Physical Exam: Vital Signs Blood pressure (!) 93/56, pulse 79, temperature 97.9 F (36.6 C), resp. rate 19, weight 64.7 kg, SpO2 94 %. General: Alert, No apparent distress HEENT: Head is normocephalic, hard of hearing Neck: Supple without JVD or lymphadenopathy Heart: Reg rate and rhythm. No murmurs rubs or gallops Chest: CTA bilaterally without wheezes, rales, or rhonchi; no distress Abdomen: Soft, non-tender, non-distended, bowel sounds positive. Extremities: No clubbing, cyanosis, or edema. Pulses are 2+ Skin: No evidence of breakdown, no evidence of rash IV site Left forearm CDI   Neurologic: Cranial nerves II through XII intact, motor strength is 4/5 in RIght 3- Left  deltoid, bicep, tricep, grip,3- Left and 4- right  hip flexor, knee extensors, ankle dorsiflexor and plantar flexor Sensory exam normal sensation to light touch and proprioception in bilateral upper and lower extremities Cerebellar exam normal finger to nose to finger as well as heel to shin in bilateral upper and lower extremities Musculoskeletal: Full range of motion in all 4 extremities. No joint swelling   Assessment/Plan: 1. Functional deficits secondary to Left caudate ICH  which require 3+ hours per day of interdisciplinary therapy in a comprehensive inpatient rehab setting.  Physiatrist is providing close team supervision and 24 hour management of active medical problems listed below.  Physiatrist and rehab  team continue to assess barriers to discharge/monitor patient progress toward functional and medical goals  Care Tool:  Bathing    Body parts bathed by patient: Right arm, Left arm, Chest, Abdomen, Right upper leg, Left upper leg, Face   Body parts bathed by helper: Right lower leg, Left lower leg, Front perineal area, Buttocks     Bathing assist Assist Level: Maximal Assistance - Patient 24 - 49%     Upper Body Dressing/Undressing Upper body dressing   What is the patient wearing?: Pull over shirt    Upper body assist Assist Level: Moderate Assistance - Patient 50 - 74%    Lower Body Dressing/Undressing Lower body dressing      What is the patient wearing?: Pants     Lower body assist Assist for lower body dressing: Maximal Assistance - Patient 25 - 49%     Toileting Toileting    Toileting assist Assist for toileting: Total Assistance - Patient < 25%     Transfers Chair/bed transfer  Transfers assist     Chair/bed transfer assist level: Minimal Assistance - Patient > 75% Chair/bed transfer assistive device: Armrests, Programmer, multimedia   Ambulation assist   Ambulation activity did not occur: Safety/medical concerns (Patient ambulated 2 steps in // bars)  Assist level: Minimal Assistance - Patient > 75% Assistive device: Walker-rolling Max distance: 124ft   Walk 10 feet activity   Assist  Walk 10 feet activity did not occur: Safety/medical concerns (patient fatigue/weakness)  Assist level: Minimal Assistance - Patient > 75% Assistive device: Walker-rolling   Walk 50 feet activity   Assist Walk 50 feet with 2 turns activity did  not occur: Safety/medical concerns (patient fatigue/weakness)  Assist level: Minimal Assistance - Patient > 75% Assistive device: Walker-rolling    Walk 150 feet activity   Assist Walk 150 feet activity did not occur: Safety/medical concerns (patient fatigue/weakness)  Assist level: Minimal Assistance -  Patient > 75% Assistive device: Walker-rolling    Walk 10 feet on uneven surface  activity   Assist Walk 10 feet on uneven surfaces activity did not occur: Safety/medical concerns (patient fatigue/weakness)         Wheelchair     Assist Will patient use wheelchair at discharge?: Yes Type of Wheelchair: Manual    Wheelchair assist level: Total Assistance - Patient < 25% Max wheelchair distance: 150    Wheelchair 50 feet with 2 turns activity    Assist        Assist Level: Total Assistance - Patient < 25%   Wheelchair 150 feet activity     Assist      Assist Level: Total Assistance - Patient < 25%   Blood pressure (!) 93/56, pulse 79, temperature 97.9 F (36.6 C), resp. rate 19, weight 64.7 kg, SpO2 94 %.  Medical Problem List and Plan: 1.  Impaired mobility and ADLs secondary to L caudate ICH- prior RIght MCA infarct              -patient may shower             -ELOS/Goals: 2-3 weeks MinA but may be lower level based on cognition and arousal UA with Hgb but neg nitrates and few WBCs Ucx pending   -Continue CIR 2.  Antithrombotics: -DVT/anticoagulation:  Pharmaceutical: Heparin             -antiplatelet therapy: N/A 3. Pain Management: N/A 4. Mood: LCSW to follow for evaluation and support             -antipsychotic agents: N/AA 5. Neuropsych: This patient is not capable of making decisions on his own behalf. UA negative 6. Skin/Wound Care:  Routine pressure relief measures. 7. Fluids/Electrolytes/Nutrition: Monitor I/O.  8. H/o CVA with spastic L- HP: No AC due to ICH/IVH. Statin on hold due to bleed.  9.  HTN: Monitor blood pressures 3 times a day.  SBP goal <160. Continue amlodipine twice daily and Avapro daily. BP controlled.  Vitals:   04/13/20 0403 04/13/20 0442  BP: (!) 104/45 (!) 93/56  Pulse: 70 79  Resp:    Temp:    SpO2: 93% 94%  Monitor for orthostasis- intake fair BUN up from prior , IVF at noc   10/3: hypotensive last several  reads. Decrease amlodipine to 5mg  daily. 10.  Schatzki's ring/Dysphagia: Continue dysphagia 1 with thin liquids.  Offer supplements between meals. 11. Vascular dementia: On Aricept and Seroquel at bedtime. Communication limited by aphasia and severe hearing impairment  Poor cooperation with exam , reduced arousal trial methylphenidate  12.  Prediabetes: Hgb A1C- 5.7. Monitor fasting BS with intermittent labs. Relatively well controlled.  13.  A fib: Rate controlled. Will monitor HR tid--off ASA due to recurrent ICH. Rate well controlled 10/3  LOS: 5 days A FACE TO FACE EVALUATION WAS PERFORMED  Carmencita Cusic P Amry Cathy 04/13/2020, 9:04 AM

## 2020-04-13 NOTE — Progress Notes (Signed)
Patient family member complained that patient hearing aid is not working and that she has just changed the battery. RN advise wife to take hearing aid home so it wont get missing and wife took it home. We continue to monitor.

## 2020-04-14 ENCOUNTER — Inpatient Hospital Stay (HOSPITAL_COMMUNITY): Payer: Medicare Other | Admitting: Occupational Therapy

## 2020-04-14 ENCOUNTER — Inpatient Hospital Stay (HOSPITAL_COMMUNITY): Payer: Medicare Other | Admitting: Speech Pathology

## 2020-04-14 ENCOUNTER — Inpatient Hospital Stay (HOSPITAL_COMMUNITY): Payer: Medicare Other

## 2020-04-14 LAB — BASIC METABOLIC PANEL
Anion gap: 7 (ref 5–15)
BUN: 12 mg/dL (ref 8–23)
CO2: 23 mmol/L (ref 22–32)
Calcium: 8.4 mg/dL — ABNORMAL LOW (ref 8.9–10.3)
Chloride: 106 mmol/L (ref 98–111)
Creatinine, Ser: 0.81 mg/dL (ref 0.61–1.24)
GFR calc Af Amer: >60 mL/min (ref >60)
GFR calc non Af Amer: >60 mL/min (ref >60)
Glucose, Bld: 83 mg/dL (ref 70–99)
Potassium: 4.2 mmol/L (ref 3.5–5.1)
Sodium: 136 mmol/L (ref 135–145)

## 2020-04-14 LAB — CBC
HCT: 35.7 % — ABNORMAL LOW (ref 39.0–52.0)
Hemoglobin: 11.8 g/dL — ABNORMAL LOW (ref 13.0–17.0)
MCH: 30.1 pg (ref 26.0–34.0)
MCHC: 33.1 g/dL (ref 30.0–36.0)
MCV: 91.1 fL (ref 80.0–100.0)
Platelets: 219 10*3/uL (ref 150–400)
RBC: 3.92 MIL/uL — ABNORMAL LOW (ref 4.22–5.81)
RDW: 15.1 % (ref 11.5–15.5)
WBC: 9.9 10*3/uL (ref 4.0–10.5)
nRBC: 0 % (ref 0.0–0.2)

## 2020-04-14 MED ORDER — TAMSULOSIN HCL 0.4 MG PO CAPS
0.4000 mg | ORAL_CAPSULE | Freq: Every day | ORAL | Status: DC
  Administered 2020-04-15 – 2020-04-22 (×8): 0.4 mg via ORAL
  Filled 2020-04-14 (×9): qty 1

## 2020-04-14 NOTE — Progress Notes (Signed)
[facility] PHYSICAL MEDICINE & REHABILITATION PROGRESS NOTE   Subjective/Complaints: Denies pain- LBM yesterday- eating breakfast with SLP in room-   Asked "what is she good for?" .  ROS-limited by cognition Objective:   No results found. Recent Labs    04/14/20 0718  WBC 9.9  HGB 11.8*  HCT 35.7*  PLT 219   Recent Labs    04/14/20 0718  NA 136  K 4.2  CL 106  CO2 23  GLUCOSE 83  BUN 12  CREATININE 0.81  CALCIUM 8.4*    Intake/Output Summary (Last 24 hours) at 04/14/2020 1605 Last data filed at 04/14/2020 0800 Gross per 24 hour  Intake 360 ml  Output 100 ml  Net 260 ml    Physical Exam: Vital Signs Blood pressure (!) 110/54, pulse 90, temperature 97.8 F (36.6 C), resp. rate 16, weight 64 kg, SpO2 98 %. General: Alert, sitting up in bed- eating D1 thin diet HEENT: very hard of hearing Neck: Supple without JVD or lymphadenopathy Heart: RRR Chest: CTA B/L- no W/R/R- good air movement Abdomen: Soft, NT, ND, (+)BS . Extremities: No clubbing, cyanosis, or edema. Pulses are 2+ Skin: No evidence of breakdown, no evidence of rash IV site Left forearm CDI   Neurologic: Cranial nerves II through XII intact, motor strength is 4/5 in RIght 3- Left  deltoid, bicep, tricep, grip,3- Left and 4- right  hip flexor, knee extensors, ankle dorsiflexor and plantar flexor Sensory exam normal sensation to light touch and proprioception in bilateral upper and lower extremities Cerebellar exam normal finger to nose to finger as well as heel to shin in bilateral upper and lower extremities Musculoskeletal: Full range of motion in all 4 extremities. No joint swelling   Assessment/Plan: 1. Functional deficits secondary to Left caudate ICH  which require 3+ hours per day of interdisciplinary therapy in a comprehensive inpatient rehab setting.  Physiatrist is providing close team supervision and 24 hour management of active medical problems listed below.  Physiatrist and rehab team  continue to assess barriers to discharge/monitor patient progress toward functional and medical goals  Care Tool:  Bathing    Body parts bathed by patient: Right arm, Left arm, Chest, Abdomen, Front perineal area, Right upper leg, Left upper leg, Face   Body parts bathed by helper: Buttocks, Right lower leg, Left lower leg     Bathing assist Assist Level: Minimal Assistance - Patient > 75%     Upper Body Dressing/Undressing Upper body dressing   What is the patient wearing?: Pull over shirt, Button up shirt    Upper body assist Assist Level: Maximal Assistance - Patient 25 - 49%    Lower Body Dressing/Undressing Lower body dressing      What is the patient wearing?: Pants, Incontinence brief     Lower body assist Assist for lower body dressing: Maximal Assistance - Patient 25 - 49%     Toileting Toileting    Toileting assist Assist for toileting: Total Assistance - Patient < 25%     Transfers Chair/bed transfer  Transfers assist     Chair/bed transfer assist level: Minimal Assistance - Patient > 75% Chair/bed transfer assistive device: Armrests, Walker   Locomotion Ambulation   Ambulation assist   Ambulation activity did not occur: Safety/medical concerns (Patient ambulated 2 steps in // bars)  Assist level: Minimal Assistance - Patient > 75% Assistive device: Walker-rolling Max distance: 169ft   Walk 10 feet activity   Assist  Walk 10 feet activity did not occur:  Safety/medical concerns (patient fatigue/weakness)  Assist level: Minimal Assistance - Patient > 75% Assistive device: Walker-rolling   Walk 50 feet activity   Assist Walk 50 feet with 2 turns activity did not occur: Safety/medical concerns (patient fatigue/weakness)  Assist level: Minimal Assistance - Patient > 75% Assistive device: Walker-rolling    Walk 150 feet activity   Assist Walk 150 feet activity did not occur: Safety/medical concerns (patient fatigue/weakness)  Assist  level: Minimal Assistance - Patient > 75% Assistive device: Walker-rolling    Walk 10 feet on uneven surface  activity   Assist Walk 10 feet on uneven surfaces activity did not occur: Safety/medical concerns (patient fatigue/weakness)         Wheelchair     Assist Will patient use wheelchair at discharge?: Yes Type of Wheelchair: Manual    Wheelchair assist level: Total Assistance - Patient < 25% Max wheelchair distance: 150    Wheelchair 50 feet with 2 turns activity    Assist        Assist Level: Total Assistance - Patient < 25%   Wheelchair 150 feet activity     Assist      Assist Level: Total Assistance - Patient < 25%   Blood pressure (!) 110/54, pulse 90, temperature 97.8 F (36.6 C), resp. rate 16, weight 64 kg, SpO2 98 %.  Medical Problem List and Plan: 1.  Impaired mobility and ADLs secondary to L caudate ICH- prior RIght MCA infarct              -patient may shower             -ELOS/Goals: 2-3 weeks MinA but may be lower level based on cognition and arousal UA with Hgb but neg nitrates and few WBCs Ucx pending   -Continue CIR 2.  Antithrombotics: -DVT/anticoagulation:  Pharmaceutical: Heparin             -antiplatelet therapy: N/A 3. Pain Management: N/A 4. Mood: LCSW to follow for evaluation and support             -antipsychotic agents: N/AA 5. Neuropsych: This patient is not capable of making decisions on his own behalf. UA negative 6. Skin/Wound Care:  Routine pressure relief measures. 7. Fluids/Electrolytes/Nutrition: Monitor I/O.  8. H/o CVA with spastic L- HP: No AC due to ICH/IVH. Statin on hold due to bleed.  9.  HTN: Monitor blood pressures 3 times a day.  SBP goal <160. Continue amlodipine twice daily and Avapro daily. BP controlled.  Vitals:   04/14/20 0731 04/14/20 1334  BP: (!) 147/93 (!) 110/54  Pulse: 84 90  Resp:  16  Temp:  97.8 F (36.6 C)  SpO2: 93% 98%  Monitor for orthostasis- intake fair BUN up from prior ,  IVF at noc   10/3: hypotensive last several reads. Decrease amlodipine to 5mg  daily.  10/4- BP running somewhat labile 110s-147/50s-90s- just got Norvasc decreased- will monitor 10.  Schatzki's ring/Dysphagia: Continue dysphagia 1 with thin liquids.  Offer supplements between meals.  10/4- eating well D1 diet 11. Vascular dementia: On Aricept and Seroquel at bedtime. Communication limited by aphasia and severe hearing impairment   10/4- hard to understand me and him- difficult communication Poor cooperation with exam , reduced arousal trial methylphenidate  12.  Prediabetes: Hgb A1C- 5.7. Monitor fasting BS with intermittent labs. Relatively well controlled.  13.  A fib: Rate controlled. Will monitor HR tid--off ASA due to recurrent ICH. Rate well controlled 10/3  10/4- rate controlled  LOS: 6 days A FACE TO FACE EVALUATION WAS PERFORMED  Jack [name] 04/14/2020, 4:05 PM

## 2020-04-14 NOTE — Progress Notes (Signed)
Speech Language Pathology Daily Session Note  Patient Details  Name: Jack [name] MRN: [id number] Date of Birth: [birth year]  Today's Date: 04/14/2020 SLP Individual Time: 0730-0827 SLP Individual Time Calculation (min): 57 min  Short Term Goals: Week 1: SLP Short Term Goal 1 (Week 1): Pt will demonstrate efficient mastication and oral clearance and minimal overt s/sx aspiration of current Dys 1 (puree) thin liquid diet with Min A verbal/visual cues for use of compensatory swallow strategies. SLP Short Term Goal 2 (Week 1): Pt will demonstrate efficient mastication and oral clearance of Dys 2 solid textures X3 prior to diet advancement. SLP Short Term Goal 3 (Week 1): Pt will sustain attention to functional tasks with Mod A verbal and visual cues for redirection. SLP Short Term Goal 4 (Week 1): Pt will demonstrate ability to problem solve basic familiar functional tasks with Max A verbal/visual cues. SLP Short Term Goal 5 (Week 1): Pt will recall 2 safety precautions with Max A verbal/visual cues for use of strategies/aids for recall. SLP Short Term Goal 6 (Week 1): Pt will orient to place, situation, and year with Max A for use of aids/recall.  Skilled Therapeutic Interventions: Pt was seen for skilled ST targeting dysphagia and cognitive goals. SLP facilitated session with Min A verbal cues for initiation and sustained attention to self feeding of breakfast.Sustained attention to PO intake lasted for ~5-6 minute intervals prior to requiring Min A verbal cues for redirection initially, but as meal continued reduced to ~3 min intervals. He demonstrated mildly prolonged but functional mastication of pureed breakfast solids, and complete oral clearance. No oral holding observed today. One immediate cough noted after sip of thin cranberry juice. Recommend continue current diet. Pt made appropriate request to urinate in urinal with assistance of SLP, and pt was continent of bladder. Pt independently  oriented to place, but required Moderate cues for use of external aid to orient to time and verbal question cue to orient to situation. Overall Max A verbal and visual cues provided for recall and problem solving use of call bell and discriminate use to request assistance vs use to control TV power. Simple external aid/visual also posted in room to assist with recall/carryover of use of call bell. Pt left sitting in bed with alarm set and needs within reach. Continue per current plan of care.          Pain Pain Assessment Pain Scale: 0-10 Pain Score: 0-No pain  Therapy/Group: Individual Therapy  Arbutus Leas 04/14/2020, 10:47 AM

## 2020-04-14 NOTE — Progress Notes (Signed)
Occupational Therapy Session Note  Patient Details  Name: Jack [name] MRN: [id number] Date of Birth: 29-Jun-1934  Today's Date: 04/14/2020 OT Individual Time: 6945-0388 OT Individual Time Calculation (min): 55 min    Short Term Goals: Week 1:  OT Short Term Goal 1 (Week 1): Pt will complete LB bathing sit to stand with min assist for 2 consecutive attempts. OT Short Term Goal 2 (Week 1): Pt will complete UB dressing sitting with supervision. OT Short Term Goal 3 (Week 1): Pt will complete toilet transfer with min assist using the RW for support. OT Short Term Goal 4 (Week 1): Pt will consistently follow one step commands related to selfcare tasks with 90% accuracy.  Skilled Therapeutic Interventions/Progress Updates:    Pt worked on bathing and dressing during session.  Min assist for supine to sit EOB with min assist and increased time to ambulate to the walk-in shower with use of the RW for support.  He needed max assist for removal of all clothing sit to stand.  Bathing was completed at overall min assist level with maxi instructional cueing for use of washcloth and applying soap as well as thoroughness.  He was able to transfer to the wheelchair stand pivot for dressing at the sink.  Min assist to donn a Designer, fashion/clothing with max assist for donning a button up shirt.  He needed max assist for donning brief and pants as well sit to stand.  Mod assist for standing balance when attempting to pull the items over his hips secondary to posterior LOB.  Total assist was needed for donning gripper socks as well as setup for combing his hair.  His wife was present for session as well. He was left sitting up in the wheelchair with the call button and phone in reach and safety alarm belt in place.    Therapy Documentation Precautions:  Precautions Precautions: Fall Precaution Comments: monitor SpO2 Restrictions Weight Bearing Restrictions: No  Pain: Pain Assessment Pain Scale: Faces Pain  Score: 0-No pain ADL: See Care Tool Section for some details of mobility and selfcare  Therapy/Group: Individual Therapy  Ezma Rehm OTR/L 04/14/2020, 12:54 PM

## 2020-04-14 NOTE — Progress Notes (Signed)
Physical Therapy Session Note  Patient Details  Name: Jack [name] MRN: [id number] Date of Birth: [birth year]  Today's Date: 04/14/2020 PT Individual Time: 1425-1510 PT Individual Time Calculation (min): 45 min   Short Term Goals: Week 1:  PT Short Term Goal 1 (Week 1): Patient will maintain dynamic sitting balance >5 mins with no more than CGA PT Short Term Goal 2 (Week 1): Patient will complete STS with LRAD and no more than MinA PT Short Term Goal 3 (Week 1): Patient will transfer bed <> wc with LRAD and no more than ModA consistently PT Short Term Goal 4 (Week 1): Patient will ambulate >46ft with LRAD and no more than ModA  Skilled Therapeutic Interventions/Progress Updates:     Patient in w/c with his wife in the room upon PT arrival. Patient alert and agreeable to PT session. Patient denied pain during session. Hearing aids donned throughout session.   Therapeutic Activity: Transfers: Patient performed sit to/from stand x4 with CGA using a RW. Provided verbal cues for hand placement on RW and reaching back to sit for controlled descent and safety every trial with [facility] assist for hand placement.  Gait Training:  Patient ambulated 70 feet using RW with min A for weight shift R>L and management of RW due to veering L. Ambulated with decreased L foot clearance, decreased R weight shift, decreased stance time on L, and variable step length with L. Provided verbal cues for heel placement at initial contact to promote improved L foot clearance, looking ahead for awareness of surroundings and RW placement, and erect posture. Limited distance due to increased environmental stimulation leading to poor attention to gait.   Neuromuscular Re-ed: Patient performed the following gait and standing balance activities for improved LE and UE motor control with functional tasks: -ambulating forwards/backwards 20 feet in front of a mirror, focused on heel strike of L foot forwards and increased hamstring  activation for backwards stepping using multimodal cues -standing playing checkers on vertical board >10 min, cued patient to use L UE to reach pieces at the top of the board, patient unable to verbalize the rules of the game when asked, but played by the rules throughout automatically, required min cues for attention to the game, max cues for use of L UE rather than R, and mod cues for recall of which color he was playing throughout, performed standing balance with CGA throughout for safety due to fatigue with prolonged standing and intermittent HOH assist for contralateral reaching to R upper quadrant of board with L UE  Patient in w/c in there room, per patient's request, at end of session with breaks locked, seat belt alarm set, and all needs within reach.    Therapy Documentation Precautions:  Precautions Precautions: Fall Precaution Comments: monitor SpO2 Restrictions Weight Bearing Restrictions: No   Therapy/Group: Individual Therapy  Shaneque Merkle L Margret Moat PT, DPT  04/14/2020, 5:29 PM

## 2020-04-14 NOTE — Progress Notes (Addendum)
Physical Therapy Session Note  Patient Details  Name: Jack [name] MRN: [id number] Date of Birth: Feb 02, 1934  Today's Date: 04/14/2020 PT Individual Time: 0915-1000 PT Individual Time Calculation (min): 45 min   Short Term Goals: Week 1:  PT Short Term Goal 1 (Week 1): Patient will maintain dynamic sitting balance >5 mins with no more than CGA PT Short Term Goal 2 (Week 1): Patient will complete STS with LRAD and no more than MinA PT Short Term Goal 3 (Week 1): Patient will transfer bed <> wc with LRAD and no more than ModA consistently PT Short Term Goal 4 (Week 1): Patient will ambulate >18ft with LRAD and no more than ModA  Skilled Therapeutic Interventions/Progress Updates:    PAIN  Denies pain  Pt initially supine, agreeable to session.  No hearing aids in room today. Pt lifts feet for therapist to don socks. Pt supine to sit w/mod assist, leaning to L , unable to self correct. Lifts feet for therapist to thread pants, min assist for sitting balance but L lean persists. STS w/mod assist and RW, pt raises pants w/bilat UEs, max assist for blance due to heavy post L lean. SPT to wc w/mod assist.  Pt transported to gym for session.  In parallel bars worked on [employer], static stand, reaching/UE and LE therex to encourage wt shift to R/improve midling orientation.    GT 52ft w/ RW Mod assist, moderate L lean, mild scissoring LLE, flexed posture, unable to correct w/cues. Second pass Gt 75ft w/RW w/min assist, cues to abduct LLE for increased base of support, improved midline orientation demonstrated w/this effort.  At end of session, pt transported back to room. Noted to be incontenent of bowels.  Nursing notified and agreed to address issue.  Pt left oob in wc w/alarm belt set and needs in reach  Therapy Documentation Precautions:  Precautions Precautions: Fall Precaution Comments: monitor SpO2 Restrictions Weight Bearing Restrictions: No   Therapy/Group: Individual Therapy   Callie Fielding, PT   Jerrilyn Cairo 04/14/2020, 12:33 PM

## 2020-04-15 ENCOUNTER — Inpatient Hospital Stay (HOSPITAL_COMMUNITY): Payer: Medicare Other | Admitting: Occupational Therapy

## 2020-04-15 ENCOUNTER — Inpatient Hospital Stay (HOSPITAL_COMMUNITY): Payer: Medicare Other | Admitting: Speech Pathology

## 2020-04-15 ENCOUNTER — Inpatient Hospital Stay (HOSPITAL_COMMUNITY): Payer: Medicare Other | Admitting: Physical Therapy

## 2020-04-15 NOTE — Plan of Care (Signed)
°  Problem: RH Dressing Goal: LTG Patient will perform upper body dressing (OT) Description: LTG Patient will perform upper body dressing with assist, with/without cues (OT). Flowsheets (Taken 04/15/2020 1631) LTG: Pt will perform upper body dressing with assistance level of: (goal downgraded based on progress and pt's spouse assisting him with this at home PTA) Moderate Assistance - Patient 50 - 74% Note: goal downgraded based on progress and pt's spouse assisting him with this at home PTA Goal: LTG Patient will perform lower body dressing w/assist (OT) Description: LTG: Patient will perform lower body dressing with assist, with/without cues in positioning using equipment (OT) Flowsheets (Taken 04/15/2020 1631) LTG: Pt will perform lower body dressing with assistance level of: (goal downgraded based on progress and pt's spouse assisting him with this at home PTA) Moderate Assistance - Patient 50 - 74% Note: goal downgraded based on progress and pt's spouse assisting him with this at home PTA   Problem: RH Awareness Goal: LTG: Patient will demonstrate awareness during functional activites type of (OT) Description: LTG: Patient will demonstrate awareness during functional activites type of (OT) Flowsheets (Taken 04/15/2020 1631) Patient will demonstrate awareness during functional activites type of: Intellectual LTG: Patient will demonstrate awareness during functional activites type of (OT): (goal downgraded secondary to pt's history of dementia) Moderate Assistance - Patient 50 - 74% Note: goal downgraded secondary to pt's history of dementia

## 2020-04-15 NOTE — Progress Notes (Addendum)
Occupational Therapy Weekly Progress Note  Patient Details  Name: Jack [name] MRN: [id number] Date of Birth: 05-Sep-1933  Beginning of progress report period: April 09, 2020 End of progress report period: April 15, 2020  Today's Date: 04/15/2020 OT Individual Time: 1000-1100 OT Individual Time Calculation (min): 60 min    Patient has met 3 of 4 short term goals.  Jack [name] is making steady progress with OT at this time.  He currently completes toilet transfers and shower transfers with use of the RW for support and overall min assist.  He is able to complete UB bathing in supported sitting at supervision and LB bathing sit to stand with min assist.  Max assist is needed for donning a pullover shirt and button up shirt.  He also continues to need max assist for LB dressing secondary to not being able to efficiently reach his LEs to donn brief, pants, shoes, and socks.  He continues with slight left hemiparesis from previous CVA, but uses the LUE at a diminished level for selfcare tasks.  Cognitively, he continues to follow one step commands consistently with ability to follow multi step commands 50% of the time.  He demonstrates awareness of place and situation, but is unable to state deficits from his CVA or remember time.  Feel he is on target to meet min guard assist goals overall but will likely need more assist for dressing tasks as his spouse assisted him with this mostly PTA.  Will downgrade those goals.  Recommend continued CIR level therapy with discharge likely next week.    Patient continues to demonstrate the following deficits: muscle weakness and muscle paralysis, impaired timing and sequencing, unbalanced muscle activation, ataxia, decreased coordination and decreased motor planning, decreased attention, decreased awareness, decreased problem solving, decreased safety awareness and decreased memory and decreased sitting balance, decreased standing balance, hemiplegia and decreased  balance strategies and therefore will continue to benefit from skilled OT intervention to enhance overall performance with BADL and Reduce care partner burden.  Patient progressing toward long term goals..  Continue plan of care.  OT Short Term Goals Week 2:  OT Short Term Goal 1 (Week 2): Continue working on established LTGs set at contact guard assist overall.  Skilled Therapeutic Interventions/Progress Updates:    Session 1: (1000-1100)  Pt completed supine to sit EOB with mod assist to start session.  Noted bladder incontinence in the brief with the bed sheets soaked as well.  He completed transfer to the wheelchair with min assist using the RW and increased time secondary to short steps.  He was able to work on cleaning peri area and buttocks with mod assist sit to stand.  Increased LOB posteriorly in standing as well.  He needed mod assist for threading brief and pants with integration of use of the reacher to assist with this.  Sockaide was also utilized for donning socks with mod demonstrational cueing after sock was placed on the sockaide.  He finished session working on combing his hair and shaving with the electric razor.  Supervision for combing his hair with mod assist for thoroughness with shaving.  He was left sitting in the wheelchair with the call button and phone in reach with safety belt in place.    Session 2:  (1287-8676)  Pt completed transfer from the wheelchair to the [facility] with min assist using the RW for support.  He then worked on sitting posture and maintaining upright anterior pelvic tilt with cervical extension while completing forward trunk  flexion for sit to stand.  Had him place BUEs on bedside table and push it forward while flexing trunk.  Gave him target on top of the table to keep his eyes on to promote cervical extension.  Worked on transitions to standing as well after pushing the table forward.  He was externally distracted throughout session as he would look over at  the car simulator and say "Hey is that guy over there working on that truck?", when no one was over there.  Therapist would also then help re-direct him that no one was over there and that there was no truck.  Had him complete session with completion of ROM arc with the LUE.  Mod demonstrational cueing needed for him to pick the ring up from one side of the arc on the left and move it to the right.  Returned to the wheelchair at min assist level with use of the RW and returned to the room.  Pt left sitting up with his spouse present and with the call button and phone in reach.  Safety alarm belt also in place.    Therapy Documentation Precautions:  Precautions Precautions: Fall Precaution Comments: monitor SpO2 Restrictions Weight Bearing Restrictions: No   Vital Signs:  Pain: Pain Assessment Pain Scale: Faces Pain Score: 0-No pain ADL: See Care Tool Section for some details of mobility and selfcare  Therapy/Group: Individual Therapy  Chrysten [name] OTR/L 04/15/2020, 12:16 PM

## 2020-04-15 NOTE — Progress Notes (Signed)
[place] PHYSICAL MEDICINE & REHABILITATION PROGRESS NOTE   Subjective/Complaints: No complaints Difficulty in answering questions but very pleasant Tries to follow basic commands. Says he has pain but cannot say where.   ROS-limited by cognition Objective:   No results found. Recent Labs    04/14/20 0718  WBC 9.9  HGB 11.8*  HCT 35.7*  PLT 219   Recent Labs    04/14/20 0718  NA 136  K 4.2  CL 106  CO2 23  GLUCOSE 83  BUN 12  CREATININE 0.81  CALCIUM 8.4*    Intake/Output Summary (Last 24 hours) at 04/15/2020 3710 Last data filed at 04/14/2020 2116 Gross per 24 hour  Intake 420 ml  Output 150 ml  Net 270 ml    Physical Exam: Vital Signs Blood pressure (!) 157/71, pulse 85, temperature 98.7 F (37.1 C), resp. rate 18, weight 64.4 kg, SpO2 98 %.  General: Alert, No apparent distress HEENT: Hard of hearing Neck: Supple without JVD or lymphadenopathy Heart: Reg rate and rhythm. No murmurs rubs or gallops Chest: CTA bilaterally without wheezes, rales, or rhonchi; no distress Abdomen: Soft, non-tender, non-distended, bowel sounds positive. Extremities: No clubbing, cyanosis, or edema. Pulses are 2+ Skin: No evidence of breakdown, no evidence of rash IV site Left forearm CDI   Neurologic: Cranial nerves II through XII intact, motor strength is 4/5 in RIght 3- Left  deltoid, bicep, tricep, grip,3- Left and 4- right  hip flexor, knee extensors, ankle dorsiflexor and plantar flexor Sensory exam normal sensation to light touch and proprioception in bilateral upper and lower extremities Cerebellar exam normal finger to nose to finger as well as heel to shin in bilateral upper and lower extremities Musculoskeletal: Full range of motion in all 4 extremities. No joint swelling     Assessment/Plan: 1. Functional deficits secondary to Left caudate ICH  which require 3+ hours per day of interdisciplinary therapy in a comprehensive inpatient rehab setting.  Physiatrist  is providing close team supervision and 24 hour management of active medical problems listed below.  Physiatrist and rehab team continue to assess barriers to discharge/monitor patient progress toward functional and medical goals  Care Tool:  Bathing    Body parts bathed by patient: Right arm, Left arm, Chest, Abdomen, Front perineal area, Right upper leg, Left upper leg, Face   Body parts bathed by helper: Buttocks, Right lower leg, Left lower leg     Bathing assist Assist Level: Minimal Assistance - Patient > 75%     Upper Body Dressing/Undressing Upper body dressing   What is the patient wearing?: Pull over shirt, Button up shirt    Upper body assist Assist Level: Maximal Assistance - Patient 25 - 49%    Lower Body Dressing/Undressing Lower body dressing      What is the patient wearing?: Pants, Incontinence brief     Lower body assist Assist for lower body dressing: Maximal Assistance - Patient 25 - 49%     Toileting Toileting    Toileting assist Assist for toileting: Total Assistance - Patient < 25%     Transfers Chair/bed transfer  Transfers assist     Chair/bed transfer assist level: Minimal Assistance - Patient > 75% Chair/bed transfer assistive device: Armrests, Walker   Locomotion Ambulation   Ambulation assist   Ambulation activity did not occur: Safety/medical concerns (Patient ambulated 2 steps in // bars)  Assist level: Minimal Assistance - Patient > 75% Assistive device: Walker-rolling Max distance: 70 ft   Walk 10  feet activity   Assist  Walk 10 feet activity did not occur: Safety/medical concerns (patient fatigue/weakness)  Assist level: Minimal Assistance - Patient > 75% Assistive device: Walker-rolling   Walk 50 feet activity   Assist Walk 50 feet with 2 turns activity did not occur: Safety/medical concerns (patient fatigue/weakness)  Assist level: Minimal Assistance - Patient > 75% Assistive device: Walker-rolling     Walk 150 feet activity   Assist Walk 150 feet activity did not occur: Safety/medical concerns (patient fatigue/weakness)  Assist level: Minimal Assistance - Patient > 75% Assistive device: Walker-rolling    Walk 10 feet on uneven surface  activity   Assist Walk 10 feet on uneven surfaces activity did not occur: Safety/medical concerns (patient fatigue/weakness)         Wheelchair     Assist Will patient use wheelchair at discharge?: Yes Type of Wheelchair: Manual    Wheelchair assist level: Total Assistance - Patient < 25% Max wheelchair distance: 150    Wheelchair 50 feet with 2 turns activity    Assist        Assist Level: Total Assistance - Patient < 25%   Wheelchair 150 feet activity     Assist      Assist Level: Total Assistance - Patient < 25%   Blood pressure (!) 157/71, pulse 85, temperature 98.7 F (37.1 C), resp. rate 18, weight 64.4 kg, SpO2 98 %.  Medical Problem List and Plan: 1.  Impaired mobility and ADLs secondary to L caudate ICH- prior RIght MCA infarct              -patient may shower             -ELOS/Goals: 2-3 weeks MinA but may be lower level based on cognition and arousal UA with Hgb but neg nitrates and few WBCs Ucx pending   -Continue CIR 2.  Antithrombotics: -DVT/anticoagulation:  Pharmaceutical: Heparin             -antiplatelet therapy: N/A 3. Pain Management: N/A 4. Mood: LCSW to follow for evaluation and support             -antipsychotic agents: N/AA 5. Neuropsych: This patient is not capable of making decisions on his own behalf. UA negative 6. Skin/Wound Care:  Routine pressure relief measures. 7. Fluids/Electrolytes/Nutrition: Monitor I/O.  8. H/o CVA with spastic L- HP: No AC due to ICH/IVH. Statin on hold due to bleed.  9.  HTN: Monitor blood pressures 3 times a day.  SBP goal <160. Continue amlodipine twice daily and Avapro daily. BP controlled.  Vitals:   04/14/20 1953 04/15/20 0618  BP: (!)  116/54 (!) 157/71  Pulse: 91 85  Resp: 16 18  Temp: 98.2 F (36.8 C) 98.7 F (37.1 C)  SpO2: 98% 98%  Monitor for orthostasis- intake fair BUN up from prior , IVF at noc   10/5: labile- continue to monitor.  10.  Schatzki's ring/Dysphagia: Continue dysphagia 1 with thin liquids.  Offer supplements between meals.  10/4- eating well D1 diet 11. Vascular dementia: On Aricept and Seroquel at bedtime. Communication limited by aphasia and severe hearing impairment   10/4- hard to understand me and him- difficult communication Poor cooperation with exam , reduced arousal trial methylphenidate  12.  Prediabetes: Hgb A1C- 5.7. Monitor fasting BS with intermittent labs. Relatively well controlled.  13.  A fib: Rate controlled. Will monitor HR tid--off ASA due to recurrent ICH.   10/5: slightly elevated- continue to monitor.  LOS:  7 days A FACE TO FACE EVALUATION WAS PERFORMED  Izora Ribas 04/15/2020, 8:33 AM

## 2020-04-15 NOTE — Progress Notes (Signed)
Physical Therapy Weekly Progress Note  Patient Details  Name: Jack [name] MRN: [id number] Date of Birth: 1933/11/02  Beginning of progress report period: April 09, 2020 End of progress report period: April 15, 2020  Today's Date: 04/15/2020 PT Individual Time: 2025-4270 PT Individual Time Calculation (min): 67 min   Patient has met 4 of 4 short term goals.  Jack [name] is progressing well with therapy demonstrating increase independence with functional mobility and improving activity tolerance. He is performing supine<>sit with min/mod assist, sit<>stands and stand pivot transfers using RW with min assist, ambulating up to 180f using RW with min assist, and ascending/descending 4 steps using R HR with min/light mod assist. During gait training pt demonstrates significantly decreased gait speed along with impaired L LE gait mechanics of excessive hip adduction and decreased foot clearance during swing as well as excessive forward trunk flexion with downward gaze with poor AD management.   Patient continues to demonstrate the following deficits muscle weakness and muscle joint tightness, decreased cardiorespiratoy endurance, impaired timing and sequencing, abnormal tone, unbalanced muscle activation and decreased coordination,  , decreased initiation, decreased attention, decreased awareness, decreased memory and delayed processing and decreased sitting balance, decreased standing balance, decreased postural control and decreased balance strategies and therefore will continue to benefit from skilled PT intervention to increase functional independence with mobility.  Patient progressing toward long term goals.  Long term goals were recently upgraded based on pt's progress.Continue plan of care.  PT Short Term Goals Week 1:  PT Short Term Goal 1 (Week 1): Patient will maintain dynamic sitting balance >5 mins with no more than CGA PT Short Term Goal 1 - Progress (Week 1): Met PT Short Term Goal  2 (Week 1): Patient will complete STS with LRAD and no more than MinA PT Short Term Goal 2 - Progress (Week 1): Met PT Short Term Goal 3 (Week 1): Patient will transfer bed <> wc with LRAD and no more than ModA consistently PT Short Term Goal 3 - Progress (Week 1): Met PT Short Term Goal 4 (Week 1): Patient will ambulate >155fwith LRAD and no more than ModA PT Short Term Goal 4 - Progress (Week 1): Met Week 2:  PT Short Term Goal 1 (Week 2): Pt will perform supine<>sit with CGA PT Short Term Goal 2 (Week 2): Pt will perform sit<>stands using LRAD with CGA PT Short Term Goal 3 (Week 2): Pt will perform stand pivot transfers using LRAD with CGA PT Short Term Goal 4 (Week 2): Pt will ambulate at least 756fsing LRAD with CGA PT Short Term Goal 5 (Week 2): Pt will ascend/descend 4 steps using 1 HR with CGA  Skilled Therapeutic Interventions/Progress Updates:  Ambulation/gait training;Discharge planning;Functional mobility training;Psychosocial support;Therapeutic Activities;Visual/perceptual remediation/compensation;Balance/vestibular training;Disease management/prevention;Neuromuscular re-education;Skin care/wound management;Therapeutic Exercise;Wheelchair propulsion/positioning;Cognitive remediation/compensation;DME/adaptive equipment instruction;Pain management;Splinting/orthotics;UE/LE Strength taining/ROM;Community reintegration;Functional electrical stimulation;Patient/family education;UE/LE Coordination activities;Stair training   Pt received sitting in w/c with his wife, Jack Huaresent and pt agreeable to therapy session. Therapist educated pt/wife on the recommendation for 24hr support at discharge and follow-up therapy as well as plans for hands-on training/education for pt's wife prior to discharge. Discussed home set-up with pt's wife reporting they live in a split level home with bed/bath on 3rd floor though they typically enter through backdoor of 1st floor with level entry. Pt's wife  reports she would be willing to move their bed to the 1st floor where pt would have access to 1/2 bath but that the full bath is on  the 3rd floor. She reports there are 6steps with R HR up to 2nd floor with kitchen/dinning area and 6 steps with L HR to 3rd floor where bed & full bathroom are. Pt's wife planning to take pictures of home entry, stairs, bed, bathroom, and car along with measure heights of bed and car seat to assist with D/C planning and mobility training.  Transported to/from gym in w/c for time management and energy conservation. Sit<>stand using RW with min assist for balance today as pt demoing posterior lean. Gait training ~40f x2 using RW with min assist for balance and AD management with therapist facilitating forward movement of RW to promote stepping response - pt continues to demo L LE adducted with decreased step length though less frequently catching toes during swing showing improved foot clearance - continues to have excessive trunk flexion with downward gaze and R trunk rotation bias. Ascended/descended 4 steps x2 using L HR only with min/ light mod assist for lifting on ascent - max cuing for sequencing of lateral step-to pattern with pt having difficulty taking wide enough steps to leave room for both feet on each step. Pt noted to be wheezing after stairs; however, SpO2 98% with HR 95bpm. Gait training ~1263fback towards room using RW with min assist for balance and manual facilitation for R/L weight shift to promote forward stepping and continued cuing for stepping L LE out towards red tape on front L RW leg to promote increased abduction and increased step length with pt able to improve for ~8 steps but then reverts back to short steps with excessive L hip adduction until cued again. Transported remainder of distance back to room and left seated in w/c with needs in reach, seat belt alarm on, and his wife present.  Therapy Documentation Precautions:  Precautions Precautions:  Fall Precaution Comments: monitor SpO2 Restrictions Weight Bearing Restrictions: No  Pain: No reports of pain throughout session.  Therapy/Group: Individual Therapy  CaTawana Scale PT, DPT, CSRS  04/15/2020, 12:52 PM

## 2020-04-15 NOTE — Progress Notes (Signed)
Speech Language Pathology Daily Session Note  Patient Details  Name: Jack [name] MRN: [id number] Date of Birth: [birth year]  Today's Date: 04/15/2020 SLP Individual Time: 6226-3335 SLP Individual Time Calculation (min): 55 min  Short Term Goals: Week 1: SLP Short Term Goal 1 (Week 1): Pt will demonstrate efficient mastication and oral clearance and minimal overt s/sx aspiration of current Dys 1 (puree) thin liquid diet with Min A verbal/visual cues for use of compensatory swallow strategies. SLP Short Term Goal 2 (Week 1): Pt will demonstrate efficient mastication and oral clearance of Dys 2 solid textures X3 prior to diet advancement. SLP Short Term Goal 3 (Week 1): Pt will sustain attention to functional tasks with Mod A verbal and visual cues for redirection. SLP Short Term Goal 4 (Week 1): Pt will demonstrate ability to problem solve basic familiar functional tasks with Max A verbal/visual cues. SLP Short Term Goal 5 (Week 1): Pt will recall 2 safety precautions with Max A verbal/visual cues for use of strategies/aids for recall. SLP Short Term Goal 6 (Week 1): Pt will orient to place, situation, and year with Max A for use of aids/recall.  Skilled Therapeutic Interventions: Pt was seen for skilled ST targeting dysphagia and cognitive goals. SLP facilitated session with trail meal containing both Dys 1 (puree) and Dys 2 (minced/ground) solids and thin liquids to assess readiness for diet advancement. Pt demonstrated efficient mastication and oral clearance across all textures with no appreciable difference in time of oral transit or mastication efficiency. Overall, his intake was more timely today in comparison to previous visits. No overt s/sx aspiration observed across solids or liquids. Recommend pt upgrade to Dys 2 (minced/ground) textures, continue thin liquids and meds crushed, full supervision for meals.  SLP further facilitated session with introduction of memory notebook as compensatory  strategy for recall of new and daily information given pt's current deficits. Pt recalled purpose/focus of therapy disciplines (PT/OT/ST) with Moderate verbal cues from clinician. He was oriented to self and place independently, Mod A required for orientation to time on the calendar. He sustained his attention to functional tasks throughout session with Min A verbal and visual cues for redirection. Memory notebook updated prior to leaving room. Pt left laying in bed with alarm set and needs within reach. Continue per current plan of care.          Pain Pain Assessment Pain Scale: 0-10 Pain Score: 0-No pain  Therapy/Group: Individual Therapy  Arbutus Leas 04/15/2020, 9:36 AM

## 2020-04-16 ENCOUNTER — Inpatient Hospital Stay (HOSPITAL_COMMUNITY): Payer: Medicare Other | Admitting: Occupational Therapy

## 2020-04-16 ENCOUNTER — Inpatient Hospital Stay (HOSPITAL_COMMUNITY): Payer: Medicare Other

## 2020-04-16 ENCOUNTER — Inpatient Hospital Stay (HOSPITAL_COMMUNITY): Payer: Medicare Other | Admitting: Speech Pathology

## 2020-04-16 NOTE — Progress Notes (Signed)
Occupational Therapy Session Note  Patient Details  Name: Jack [name] MRN: [id number] Date of Birth: 1933-11-07  Today's Date: 04/16/2020 OT Individual Time: 0800-0901 OT Individual Time Calculation (min): 61 min    Short Term Goals: Week 2:  OT Short Term Goal 1 (Week 2): Continue working on established LTGs set at contact guard assist overall.  Skilled Therapeutic Interventions/Progress Updates:    Pt in bed to start session with completion of supine to sit with min assist.  He then completed stand pivot to the wheelchair with use of the RW for support.  He was able to work on bathing and dressing sit to stand at the sink.  Max instructional cueing with min assist to complete all bathing.  Front and back peri area were not washed secondary to nursing helping pt wash and donn new brief earlier.  He donned a pullover shirt with min assist and then his pants with overall min assist as well.  Shoes and socks were donned with overall max assist secondary to not being able to efficiently cross one LE over the other and reach his feet.  He completed oral hygiene with setup in the wheelchair as well as combing of his hair.  He finished session sitting up in the wheelchair with the call button and phone in reach and safety belt and tele-sitter in place.    Therapy Documentation Precautions:  Precautions Precautions: Fall Precaution Comments: monitor SpO2 Restrictions Weight Bearing Restrictions: No   Pain: Pain Assessment Pain Scale: Faces Pain Score: 0-No pain ADL: See   Therapy/Group: Individual Therapy  Lavontay Kirk OTR/L 04/16/2020, 10:32 AM

## 2020-04-16 NOTE — Progress Notes (Signed)
Physical Therapy Session Note  Patient Details  Name: Jack [name] MRN: [id number] Date of Birth: March 16, 1934  Today's Date: 04/16/2020 PT Individual Time: 1100-1157 PT Individual Time Calculation (min): 57 min   Short Term Goals: Week 2:  PT Short Term Goal 1 (Week 2): Pt will perform supine<>sit with CGA PT Short Term Goal 2 (Week 2): Pt will perform sit<>stands using LRAD with CGA PT Short Term Goal 3 (Week 2): Pt will perform stand pivot transfers using LRAD with CGA PT Short Term Goal 4 (Week 2): Pt will ambulate at least 71ft using LRAD with CGA PT Short Term Goal 5 (Week 2): Pt will ascend/descend 4 steps using 1 HR with CGA  Skilled Therapeutic Interventions/Progress Updates:    Patient received sitting up in wc, wife at bedside, agreeable to PT. He denies pain. PT propelling patient in wc to therapy gym for time management. He was able to negotiate 4 steps with R HR + ModA and max verbal cuing. Patient requiring tactile cuing to complete large enough lateral step so that 2nd foot can fit onto stair. As patient fatigued on stairs, he assumed a crouched position requiring increased verbal and tactile cues from PT to maintain erect posture. Patient adamant that wife will be able to assist patient up and down stairs at home- he will benefit from further practice of this task prior to dc home. Patient completing standing toe taps onto 6" box with CGA and verbal cues to maintain reciprocal stepping. Seated UE coordination task completed to encourage upright posture and equal engagement of B UE. Min verbal cuing needed to complete this task accurately. Patient ambulating from therapy gym to room ~175ft with FWW and CGA. He maintains a very slow gait speed and step-to gait pattern with R LE. Patient returning to wc in room, seatbelt alarm on, call light within reach, wife at bedside.   Therapy Documentation Precautions:  Precautions Precautions: Fall Precaution Comments: monitor  SpO2 Restrictions Weight Bearing Restrictions: No    Therapy/Group: Individual Therapy  Debbora Dus 04/16/2020, 7:45 AM

## 2020-04-16 NOTE — Patient Care Conference (Signed)
Inpatient RehabilitationTeam Conference and Plan of Care Update Date: 04/16/2020   Time: 10:44 AM    Patient Name: Jack [name]      Medical Record Number: [id number]  Date of Birth: 1934-04-14 Sex: Male         Room/Bed: 4M04C/4M04C-01 Payor Info: Payor: [place] / Plan: [facility] MEDICARE / Product Type: *No Product type* /    Admit Date/Time:  04/08/2020  4:09 PM  Primary Diagnosis:  ICH (intracerebral hemorrhage) [facility])  Hospital Problems: Principal Problem:   ICH (intracerebral hemorrhage) ([place]) - L caudate head w/ IVH and [facility]    Expected Discharge Date: Expected Discharge Date: 04/23/20  Team Members Present: Physician leading conference: Dr. Alysia Penna Care Coodinator Present: Dorien Chihuahua, RN, BSN, CRRN;Christina Sampson Goon, [place] Nurse Present: Other (comment) Susie Cassette, RN) PT Present: Other (comment) Estevan Ryder, PT) OT Present: Clyda Greener, OT SLP Present: Jettie Booze, CF-SLP PPS Coordinator present : Gunnar Fusi, SLP     Current Status/Progress Goal Weekly Team Focus  Bowel/Bladder   Pt is incontinent of bowel and bladder. LBM-04/15/20  To become more continent of B/B.  Assess tolieting needs prn.   Swallow/Nutrition/ Hydration   Dys 2 textures, thin liquids, full supervision  Supervision A least restrictive diet  tolerance recently upgraded diet, dys 3 trials if appropriate, independence with self feeding and strategies   ADL's   Supervision for UB bathing with mod to max assist for UB and LB dressing.  Min assist for LB bathing.  Transfers are min assist to the walk-in shower and simulated to the toilet.  Still with moderate confusion  supervison to contact guard except dressing at min to mod assist level  selfcare retraining, transfer training, balance retraining, DME education. neuromuscular re-education, pt education   Mobility   min/mod assist bed mobility, min assist sit<>stand and stand pivot transfers using RW, min assist gait  up to 157ft using RW, min/light mod assist 4 steps using R HR  supervision overall at ambulatory level  activity tolerance/endurance, gait training, pt/family education, dynamic standing balance, L LE NMR, B LE strengthening, stair navigation, transfer training   Communication             Safety/Cognition/ Behavioral Observations  Max memory and problem solving, Min attention and initiation  Min-Mod  attention, recall with strategies, orientation, basic functional familiar problem solving   Pain   Is on face pain scale due to confusion. No signs of discomfort.  To remain pain free.  Assess pain q shift or prn.   Skin   Ecchymosis to bilateral sides of abdomen. Skin intact.  To prevent skin breakdown from occurring.  Assess skin q shift or prn.     Discharge Planning:  Goal to discharge home with spouse and family. Able to set up bed and bath on main level, no steps to enter. Has RW   Team Discussion: Hearing impairment impacting progress although note improved initiation, still requires max assist for recall. Note confusion and hallucinations at times. Ritalin trial for lethargy. Incontinent of B+B. Staff concerned about discharge plan and support available at home. Patient on target to meet rehab goals: yes, currently requires min assist for tranfers, gait and stairs. Supervision goals set.  *See Care Plan and progress notes for long and short-term goals.   Revisions to Treatment Plan:  Upgraded diet to D2-thin and patient is able to manage diet and adhere to swallowing precautions. PT Downgraded stair goals.  Teaching Needs: Transfers, toileting, safety, medications, etc.  Current Barriers to Discharge: Decreased family support  and hearing impairment  Possible Resolutions to Barriers: Family education with wife Private pay caregivers recommended     Medical Summary Current Status: Very hard of hearing, poor level of alertness but overall improving,  Barriers to Discharge:  Medical stability;Other (comments)  Barriers to Discharge Comments: History of multiple infarcts with cognitive dysfunction Possible Resolutions to Barriers/Weekly Focus: Continue Ritalin trial may need to adjust dosing   Continued Need for Acute Rehabilitation Level of Care: The patient requires daily medical management by a physician with specialized training in physical medicine and rehabilitation for the following reasons: Direction of a multidisciplinary physical rehabilitation program to maximize functional independence : Yes Medical management of patient stability for increased activity during participation in an intensive rehabilitation regime.: Yes Analysis of laboratory values and/or radiology reports with any subsequent need for medication adjustment and/or medical intervention. : Yes   I attest that I was present, lead the team conference, and concur with the assessment and plan of the team.   Dorien Chihuahua B 04/16/2020, 2:32 PM

## 2020-04-16 NOTE — Progress Notes (Signed)
Patient ID: Jack [name], male   DOB: 11-08-1933, 86 y.o.   MRN: [id number] Team Conference Report to Patient/Family  Team Conference discussion was reviewed with the patient and caregiver, including goals, any changes in plan of care and target discharge date.  Patient and caregiver express understanding and are in agreement.  The patient has a target discharge date of 04/23/20.  [name] 04/16/2020, 1:30 PM

## 2020-04-16 NOTE — Progress Notes (Signed)
Physical Therapy Session Note  Patient Details  Name: Jack [name] MRN: [id number] Date of Birth: Aug 04, 1933  Today's Date: 04/16/2020 PT Individual Time: 1415-1457 PT Individual Time Calculation (min): 42 min   Short Term Goals: Week 2:  PT Short Term Goal 1 (Week 2): Pt will perform supine<>sit with CGA PT Short Term Goal 2 (Week 2): Pt will perform sit<>stands using LRAD with CGA PT Short Term Goal 3 (Week 2): Pt will perform stand pivot transfers using LRAD with CGA PT Short Term Goal 4 (Week 2): Pt will ambulate at least 83ft using LRAD with CGA PT Short Term Goal 5 (Week 2): Pt will ascend/descend 4 steps using 1 HR with CGA  Skilled Therapeutic Interventions/Progress Updates:    Patient received sitting up in wc, wife present at bedside. He denies pain and is agreeable to PT. Patient able to ambulate from room to therapy gym using FWW and light CGA. Very slow gait speed maintained and decreased step length R LE> LLE. Patient completing lateral stepping in // bars x3 laps with CGA and Min verbal cuing for sequencing. Patient with apparent labored breathing upon sitting, but O2 >96% and patient denied feeling SOB. Patient ambulating fwd/retro in // bars x3 laps. Noted increase in gait speed when ambulating backwards while maintaining control over steps, no LOB. Patient unable to achieve same gait speed when ambulating forwards. Patient ambulating back to room with CGA and FWW. Seatbelt alarm on, call light within reach, wife at bedside.   Therapy Documentation Precautions:  Precautions Precautions: Fall Precaution Comments: monitor SpO2 Restrictions Weight Bearing Restrictions: No   Therapy/Group: Individual Therapy  Karoline Caldwell, PT, DPT, CBIS 04/16/2020, 3:31 PM

## 2020-04-16 NOTE — Progress Notes (Signed)
[place] PHYSICAL MEDICINE & REHABILITATION PROGRESS NOTE   Subjective/Complaints:    No issues per RN, remains incont   ROS-limited by cognition Objective:   No results found. Recent Labs    04/14/20 0718  WBC 9.9  HGB 11.8*  HCT 35.7*  PLT 219   Recent Labs    04/14/20 0718  NA 136  K 4.2  CL 106  CO2 23  GLUCOSE 83  BUN 12  CREATININE 0.81  CALCIUM 8.4*    Intake/Output Summary (Last 24 hours) at 04/16/2020 0753 Last data filed at 04/15/2020 1810 Gross per 24 hour  Intake 310 ml  Output 100 ml  Net 210 ml    Physical Exam: Vital Signs Blood pressure (!) 138/59, pulse 87, temperature 98.7 F (37.1 C), temperature source Oral, resp. rate 14, weight 64.2 kg, SpO2 100 %.   General: No acute distress Mood and affect are appropriate Heart: Regular rate and rhythm no rubs murmurs or extra sounds Lungs: Clear to auscultation, breathing unlabored, no rales or wheezes Abdomen: Positive bowel sounds, soft nontender to palpation, nondistended Extremities: No clubbing, cyanosis, or edema Skin: No evidence of breakdown, no evidence of rash  Neurologic: Cranial nerves II through XII intact, motor strength is 4/5 in RIght 3- Left  deltoid, bicep, tricep, grip,3- Left and 4- right  hip flexor, knee extensors, ankle dorsiflexor and plantar flexor Sensory exam normal sensation to light touch and proprioception in bilateral upper and lower extremities Cerebellar exam normal finger to nose to finger as well as heel to shin in bilateral upper and lower extremities Musculoskeletal: Full range of motion in all 4 extremities. No joint swelling     Assessment/Plan: 1. Functional deficits secondary to Left caudate ICH  which require 3+ hours per day of interdisciplinary therapy in a comprehensive inpatient rehab setting.  Physiatrist is providing close team supervision and 24 hour management of active medical problems listed below.  Physiatrist and rehab team continue to  assess barriers to discharge/monitor patient progress toward functional and medical goals  Care Tool:  Bathing    Body parts bathed by patient: Right arm, Left arm, Chest, Abdomen, Front perineal area, Right upper leg, Left upper leg, Face   Body parts bathed by helper: Front perineal area, Buttocks     Bathing assist Assist Level: Moderate Assistance - Patient 50 - 74%     Upper Body Dressing/Undressing Upper body dressing   What is the patient wearing?: Button up shirt    Upper body assist Assist Level: Moderate Assistance - Patient 50 - 74%    Lower Body Dressing/Undressing Lower body dressing      What is the patient wearing?: Pants, Incontinence brief     Lower body assist Assist for lower body dressing: Moderate Assistance - Patient 50 - 74%     Toileting Toileting    Toileting assist Assist for toileting: Maximal Assistance - Patient 25 - 49%     Transfers Chair/bed transfer  Transfers assist     Chair/bed transfer assist level: Minimal Assistance - Patient > 75% Chair/bed transfer assistive device: Armrests, Walker   Locomotion Ambulation   Ambulation assist   Ambulation activity did not occur: Safety/medical concerns (Patient ambulated 2 steps in // bars)  Assist level: Minimal Assistance - Patient > 75% Assistive device: Walker-rolling Max distance: 150f   Walk 10 feet activity   Assist  Walk 10 feet activity did not occur: Safety/medical concerns (patient fatigue/weakness)  Assist level: Minimal Assistance - Patient > 75%  Assistive device: Walker-rolling   Walk 50 feet activity   Assist Walk 50 feet with 2 turns activity did not occur: Safety/medical concerns (patient fatigue/weakness)  Assist level: Minimal Assistance - Patient > 75% Assistive device: Walker-rolling    Walk 150 feet activity   Assist Walk 150 feet activity did not occur: Safety/medical concerns (patient fatigue/weakness)  Assist level: Minimal Assistance -  Patient > 75% Assistive device: Walker-rolling    Walk 10 feet on uneven surface  activity   Assist Walk 10 feet on uneven surfaces activity did not occur: Safety/medical concerns (patient fatigue/weakness)         Wheelchair     Assist Will patient use wheelchair at discharge?: Yes Type of Wheelchair: Manual    Wheelchair assist level: Total Assistance - Patient < 25% Max wheelchair distance: 150    Wheelchair 50 feet with 2 turns activity    Assist        Assist Level: Total Assistance - Patient < 25%   Wheelchair 150 feet activity     Assist      Assist Level: Total Assistance - Patient < 25%   Blood pressure (!) 138/59, pulse 87, temperature 98.7 F (37.1 C), temperature source Oral, resp. rate 14, weight 64.2 kg, SpO2 100 %.  Medical Problem List and Plan: 1.  Impaired mobility and ADLs secondary to L caudate ICH- prior RIght MCA infarct              -patient may shower             -ELOS/Goals: 2-3 weeks MinA but may be lower level based on cognition and arousal   -Continue CIR PT< OT Team conference today please see physician documentation under team conference tab, met with team  to discuss problems,progress, and goals. Formulized individual treatment plan based on medical history, underlying problem and comorbidities.  2.  Antithrombotics: -DVT/anticoagulation:  Pharmaceutical: Heparin             -antiplatelet therapy: N/A 3. Pain Management: N/A 4. Mood: LCSW to follow for evaluation and support             -antipsychotic agents: N/AA 5. Neuropsych: This patient is not capable of making decisions on his own behalf. UA negative 6. Skin/Wound Care:  Routine pressure relief measures. 7. Fluids/Electrolytes/Nutrition: Monitor I/O.  8. H/o CVA with spastic L- HP: No AC due to ICH/IVH. Statin on hold due to bleed.  9.  HTN: Monitor blood pressures 3 times a day.  SBP goal <160. Continue amlodipine twice daily and Avapro daily. BP controlled.   Vitals:   04/15/20 1936 04/16/20 0525  BP: 125/80 (!) 138/59  Pulse: 87 87  Resp: 19 14  Temp: 98.4 F (36.9 C) 98.7 F (37.1 C)  SpO2: 100%    10.  Schatzki's ring/Dysphagia: Continue dysphagia 1 with thin liquids.  Offer supplements between meals.  10/4- eating well D1 diet 11. Vascular dementia: On Aricept and Seroquel at bedtime. Communication limited by aphasia and severe hearing impairment     reduced arousal trial methylphenidate 58m BID  12.  Prediabetes: Hgb A1C- 5.7. Monitor fasting BS with intermittent labs. Relatively well controlled.  13.  A fib: Rate controlled 10/6. Will monitor HR tid--off ASA due to recurrent ICH.    LOS: 8 days A FACE TO FACE EVALUATION WAS PERFORMED  ACharlett Blake10/12/2019, 7:53 AM

## 2020-04-16 NOTE — Progress Notes (Signed)
Speech Language Pathology Weekly Progress and Session Note  Patient Details  Name: Jack [name] MRN: [id number] Date of Birth: [birth year]  Beginning of progress report period: April 10, 2020 End of progress report period: April 16, 2020  Today's Date: 04/16/2020 SLP Individual Time: 5465-0354 SLP Individual Time Calculation (min): 45 min  Short Term Goals: Week 1: SLP Short Term Goal 1 (Week 1): Pt will demonstrate efficient mastication and oral clearance and minimal overt s/sx aspiration of current Dys 1 (puree) thin liquid diet with Min A verbal/visual cues for use of compensatory swallow strategies. SLP Short Term Goal 1 - Progress (Week 1): Met SLP Short Term Goal 2 (Week 1): Pt will demonstrate efficient mastication and oral clearance of Dys 2 solid textures X3 prior to diet advancement. SLP Short Term Goal 2 - Progress (Week 1): Met SLP Short Term Goal 3 (Week 1): Pt will sustain attention to functional tasks with Mod A verbal and visual cues for redirection. SLP Short Term Goal 3 - Progress (Week 1): Met SLP Short Term Goal 4 (Week 1): Pt will demonstrate ability to problem solve basic familiar functional tasks with Max A verbal/visual cues. SLP Short Term Goal 4 - Progress (Week 1): Met SLP Short Term Goal 5 (Week 1): Pt will recall 2 safety precautions with Max A verbal/visual cues for use of strategies/aids for recall. SLP Short Term Goal 5 - Progress (Week 1): Progressing toward goal SLP Short Term Goal 6 (Week 1): Pt will orient to place, situation, and year with Max A for use of aids/recall. SLP Short Term Goal 6 - Progress (Week 1): Met    New Short Term Goals: Week 2: SLP Short Term Goal 1 (Week 2): STG=LTG due to estimated remaining length of stay  Weekly Progress Updates: Pt has made functional gains and met 5 out of 6 short term goals. Pt is currently Mod-Max assist for basic cognitive tasks due to impairments impacting his functional problem solving, recall, and  awareness. Pt was upgraded to a dysphagia 2 texture (minced/ground) diet with thin liquids and is using safe swallow strategies with Min-Supervision A. Pt has demonstrated improved sustained attention, verbal output and basic comprehension. Pt education is ongoing; no family has been present for ST sessions to date. Pt would continue to benefit from skilled ST while inpatient in order to maximize functional independence and reduce burden of care prior to discharge. Anticipate that pt will need 24/7 supervision at discharge in addition to [place] follow up at next level of care.       Intensity: Minumum of 1-2 x/day, 30 to 90 minutes Frequency: 3 to 5 out of 7 days Duration/Length of Stay: 04/23/20 Treatment/Interventions: Cognitive remediation/compensation;Cueing hierarchy;Dysphagia/aspiration precaution training;Functional tasks;Patient/family education;Internal/external aids   Daily Session  Skilled Therapeutic Interventions: Pt was seen for skilled ST targeting dysphagia, cognition, and starting education with pt's wife who was present for session. SLP facilitated session with skilled observation of pt consuming recently upgraded dysphagia 2 (minced/ground) texture lunch tray and thin liquids. He only required set up assist and Min A for initiation of self feeding, as well as Supervision A level verbal cues to utilize slow rate/smaller bites during intake. No overt s/sx aspiration observed and pt's mastication was very timely and efficient. Complete oral clearance achieved without need for cues. Discussed baseline diet with wife who reports was mostly unrestricted (regular textures). Informed her we will continue to work toward advancement while inpatient. Recommend continue current diet for now. Pt's wife also provided more  information about pt's cognitive baseline, which she reports he is close to at this time. However, she also reported he was independent with managing medications at home prior to  admission; ST will target at next available session in order to provide appropriate recommendations regarding safety with this task upon his return home. Also discussed compensatory aids/strategies for memory that have been targeted and may be helpful at home. When prompted, pt independently identified and stated purpose of memory notebook at bedside. He also used it to recall information from earlier in the day with Moderate verbal and visual cues. He used a monthly calendar to orient to date with Max A verbal and visual cues. Pt continues to recall that his call bell can work the TV, but requires Moderate verbal question cues to recall function of red nurse call button. All questions answered to pt and wife's satisfaction. Pt left sitting in chair with seatbelt alarm in place, needs within reach. Continue per current plan of care.             Pain Pain Assessment Pain Scale: Faces Pain Score: 0-No pain  Therapy/Group: Individual Therapy  Arbutus Leas 04/16/2020, 1:53 PM

## 2020-04-17 ENCOUNTER — Inpatient Hospital Stay (HOSPITAL_COMMUNITY): Payer: Medicare Other | Admitting: Speech Pathology

## 2020-04-17 ENCOUNTER — Inpatient Hospital Stay (HOSPITAL_COMMUNITY): Payer: Medicare Other | Admitting: Physical Therapy

## 2020-04-17 ENCOUNTER — Inpatient Hospital Stay (HOSPITAL_COMMUNITY): Payer: Medicare Other | Admitting: Occupational Therapy

## 2020-04-17 NOTE — Progress Notes (Signed)
Speech Language Pathology Daily Session Note  Patient Details  Name: Jack [name] MRN: [id number] Date of Birth: [birth year]  Today's Date: 04/17/2020 SLP Individual Time: 0830-0927 SLP Individual Time Calculation (min): 57 min  Short Term Goals: Week 2: SLP Short Term Goal 1 (Week 2): STG=LTG due to estimated remaining length of stay  Skilled Therapeutic Interventions: Pt was seen for skilled ST targeting cognitive goals. Pt independently oriented to self, place, and situation this morning. Overall Moderate verbal and visual cues for use of calendar to orient to date. Given pt's wife's reports that he used to manage medications independently prior to this admission, SLP further facilitated session by simulating a medication management task. Pt required Max A verbal and visual cues for problem solving and recall, Mod A verbal and visual cues for error awareness when organizing a BID pill box. He also required Max A to comprehend written information about his medications in order to plan how to begin organizing the pill box. Pt's wife arrived, therefore skilled education provided regarding pt's performance on familiar medication management task and recommendation for her to provide Total A for medication management at home at discharge. She was acknowledged this and was in agreement. Pt able to sort colored beads by color with Min A verbal and visual cues for sustained attention and problem solving within task. Memory notebook updated at end of session. Pt left sitting in bed with alarm set and needs within reach, telesitter engaged, wife still present. Continue per current plan of care.          Pain Pain Assessment Pain Scale: 0-10 Pain Score: 0-No pain  Therapy/Group: Individual Therapy  Arbutus Leas 04/17/2020, 10:56 AM

## 2020-04-17 NOTE — Progress Notes (Signed)
Physical Therapy Session Note  Patient Details  Name: Jack [name] MRN: [id number] Date of Birth: [birth year]  Today's Date: 04/17/2020 PT Individual Time: 4270-6237 PT Individual Time Calculation (min): 42 min   Short Term Goals: Week 1:  PT Short Term Goal 1 (Week 1): Patient will maintain dynamic sitting balance >5 mins with no more than CGA PT Short Term Goal 1 - Progress (Week 1): Met PT Short Term Goal 2 (Week 1): Patient will complete STS with LRAD and no more than MinA PT Short Term Goal 2 - Progress (Week 1): Met PT Short Term Goal 3 (Week 1): Patient will transfer bed <> wc with LRAD and no more than ModA consistently PT Short Term Goal 3 - Progress (Week 1): Met PT Short Term Goal 4 (Week 1): Patient will ambulate >6f with LRAD and no more than ModA PT Short Term Goal 4 - Progress (Week 1): Met  Skilled Therapeutic Interventions/Progress Updates: Pt presents sitting in w/c w/ spouse present and agreeable to therapy.  Spouse displays pictures of home w/ stairs.  Discussed need to remove throw rugs pictured for safety and agreeable.  Pt wheeled to gym for energy conservation.  Pt transfers multiple trials during session w/ min A.  Pt amb 15' w/ RW multiple trials during treatment including to car.  Pt required min A for car transfers.  Pt negotiated 4 steps w/ B hands on R railing w/ min A and verbal cues for foot placement and then 4 steps w/ B hands on L rail.  Performed better on L rail.  Pt amb w/ RW w/ verbal cues for foot clearance and step length especially R foot.  Pt performed SPT w/c > mat table and min A, w/ RW mat table > w/c w/ CGA.  Pt returned to room and remained in w/c w/ spouse present.  Chair alarm on and all needs in reach.     Therapy Documentation Precautions:  Precautions Precautions: Fall Precaution Comments: monitor SpO2 Restrictions Weight Bearing Restrictions: No General:   Vital Signs: Therapy Vitals Temp: 98.7 F (37.1 C) Temp Source:  Oral Pulse Rate: 93 Resp: 17 BP: 124/81 Patient Position (if appropriate): Sitting Oxygen Therapy SpO2: 100 % O2 Device: Room Air Pain:0/10 Pain Assessment Pain Scale: 0-10 Pain Score: 0-No pain Mobility:     Therapy/Group: Individual Therapy  JLadoris Gene10/01/2020, 2:05 PM

## 2020-04-17 NOTE — Progress Notes (Addendum)
Occupational Therapy Session Note  Patient Details  Name: BLUFORD SEDLER MRN: [id number] Date of Birth: [birth year]  Today's Date: 04/17/2020 OT Individual Time: 1005-1104 OT Individual Time Calculation (min): 59 min    Short Term Goals: Week 2:  OT Short Term Goal 1 (Week 2): Continue working on established LTGs set at contact guard assist overall.  Skilled Therapeutic Interventions/Progress Updates:    Session 1: (1005-1104)  Pt completed supine to sit EOB without use of the rails and the bed flat at min assist level.  He then worked on [employer] dressing to donn pull up pants as well as his shoes.  Mod instructional cueing needed to try and start dressing the LLE first when donning pants, with min assist needed to complete placing them over his feet.   He stood with min assist as well to pull the items over his hips.  Next, he worked on donning his shoes with min assist for crossing the LLE over the right knee and maintaining it while donning his shoe.  He was not able tie his shoes, secondary to decreased flexibility and decreased FM coordination in the left hand.  He transferred to the wheelchair with min assist and then completed shaving and combing his hair at the sink with setup of supplies.  Therapist then took him down to the therapy gym where he transferred to the therapy mat a min assist level and worked on standing balance while engaged in horseshoe tossing activity.  He was able to reach down with the LUE and RUE to pick up horseshoes from approximately 8-10" from the floor with min assist to stand and toss them.   Max demonstrational cueing for upright posture in standing as pt maintains knee flexion on the left as well as lean to the left with some trunk flexion present.  He completed 3 intervals of standing for approximately 2-3 mins each to toss the horseshoes.  Increased LOB posteriorly at times was noted.  Finished session with functional mobility from the mat out into the hallway  approximately 25' with min assist using the RW.  Therapist took him the rest of the way in his wheelchair.  Once in the room he was left sitting up with his spouse present and with the call button, phone, and safety belt in place.    Session 2: (5366-4403)  Pt completed BUE strengthening with use of the UE ergonometer.  He was able to complete transfer on and off of the machine with min assist using the RW for support.  Resistance was left at level 1 with pt completing the first set of 4 mins using BUEs and RPMs at around 14.  He needed mod demonstrational cueing to maintain his attention to the LUE and re-grip as needed.  The 2nd set was completed for 4 mins as well with isolated use of the LUE only.  Again he needed mod demonstrational cueing to maintain grip as well with RPMs at 8.  Returned to the room at the end of the session with the call button and phone in reach and safety belt in place.  Pt's spouse in the room as well.    Therapy Documentation Precautions:  Precautions Precautions: Fall Precaution Comments: monitor SpO2 Restrictions Weight Bearing Restrictions: No  Pain: Pain Assessment Pain Scale: 0-10 Pain Score: 0-No pain ADL: See Care Tool Section for some details of mobility and selfcare  Therapy/Group: Individual Therapy  Jettie Lazare OTR/L 04/17/2020, 12:24 PM

## 2020-04-17 NOTE — Progress Notes (Signed)
[facility] PHYSICAL MEDICINE & REHABILITATION PROGRESS NOTE   Subjective/Complaints:    Very HOH, lost 1 hearing aide, oriented to person and place  ROS-limited by cognition Objective:   No results found. No results for input(s): WBC, HGB, HCT, PLT in the last 72 hours. No results for input(s): NA, K, CL, CO2, GLUCOSE, BUN, CREATININE, CALCIUM in the last 72 hours.  Intake/Output Summary (Last 24 hours) at 04/17/2020 0744 Last data filed at 04/16/2020 2049 Gross per 24 hour  Intake 517 ml  Output 225 ml  Net 292 ml    Physical Exam: Vital Signs Blood pressure 130/62, pulse 69, temperature 98.7 F (37.1 C), resp. rate 20, weight 64.8 kg, SpO2 96 %.  General: No acute distress Mood and affect are appropriate Heart: Regular rate and rhythm no rubs murmurs or extra sounds Lungs: Clear to auscultation, breathing unlabored, no rales or wheezes Abdomen: Positive bowel sounds, soft nontender to palpation, nondistended Extremities: No clubbing, cyanosis, or edema Skin: No evidence of breakdown, no evidence of rash  Neurologic: Cranial nerves II through XII intact, motor strength is 4/5 in RIght 3- Left  deltoid, bicep, tricep, grip,3- Left and 4- right  hip flexor, knee extensors, ankle dorsiflexor and plantar flexor Sensory exam normal sensation to light touch and proprioception in bilateral upper and lower extremities Cerebellar exam normal finger to nose to finger as well as heel to shin in bilateral upper and lower extremities Musculoskeletal: Full range of motion in all 4 extremities. No joint swelling     Assessment/Plan: 1. Functional deficits secondary to Left caudate ICH  which require 3+ hours per day of interdisciplinary therapy in a comprehensive inpatient rehab setting.  Physiatrist is providing close team supervision and 24 hour management of active medical problems listed below.  Physiatrist and rehab team continue to assess barriers to discharge/monitor patient  progress toward functional and medical goals  Care Tool:  Bathing    Body parts bathed by patient: Right arm, Left arm, Chest, Abdomen, Right upper leg, Left upper leg, Face   Body parts bathed by helper: Right lower leg, Left lower leg Body parts n/a: Front perineal area, Buttocks (Nursing completed prior to session)   Bathing assist Assist Level: Minimal Assistance - Patient > 75%     Upper Body Dressing/Undressing Upper body dressing   What is the patient wearing?: Pull over shirt    Upper body assist Assist Level: Minimal Assistance - Patient > 75%    Lower Body Dressing/Undressing Lower body dressing      What is the patient wearing?: Pants     Lower body assist Assist for lower body dressing: Minimal Assistance - Patient > 75%     Toileting Toileting    Toileting assist Assist for toileting: Maximal Assistance - Patient 25 - 49%     Transfers Chair/bed transfer  Transfers assist     Chair/bed transfer assist level: Minimal Assistance - Patient > 75% Chair/bed transfer assistive device: Armrests, Programmer, multimedia   Ambulation assist   Ambulation activity did not occur: Safety/medical concerns (Patient ambulated 2 steps in // bars)  Assist level: Contact Guard/Touching assist Assistive device: Walker-rolling Max distance: 150   Walk 10 feet activity   Assist  Walk 10 feet activity did not occur: Safety/medical concerns (patient fatigue/weakness)  Assist level: Contact Guard/Touching assist Assistive device: Walker-rolling   Walk 50 feet activity   Assist Walk 50 feet with 2 turns activity did not occur: Safety/medical concerns (patient fatigue/weakness)  Assist level: Contact Guard/Touching assist Assistive device: Walker-rolling    Walk 150 feet activity   Assist Walk 150 feet activity did not occur: Safety/medical concerns (patient fatigue/weakness)  Assist level: Contact Guard/Touching assist Assistive device:  Walker-rolling    Walk 10 feet on uneven surface  activity   Assist Walk 10 feet on uneven surfaces activity did not occur: Safety/medical concerns (patient fatigue/weakness)         Wheelchair     Assist Will patient use wheelchair at discharge?: Yes Type of Wheelchair: Manual    Wheelchair assist level: Total Assistance - Patient < 25% Max wheelchair distance: 150    Wheelchair 50 feet with 2 turns activity    Assist        Assist Level: Total Assistance - Patient < 25%   Wheelchair 150 feet activity     Assist      Assist Level: Total Assistance - Patient < 25%   Blood pressure 130/62, pulse 69, temperature 98.7 F (37.1 C), resp. rate 20, weight 64.8 kg, SpO2 96 %.  Medical Problem List and Plan: 1.  Impaired mobility and ADLs secondary to L caudate ICH- prior RIght MCA infarct              -patient may shower             -ELOS/Goals: 2-3 weeks MinA but may be lower level based on cognition and arousal   -Continue CIR PT, OT   2.  Antithrombotics: -DVT/anticoagulation:  Pharmaceutical: Heparin             -antiplatelet therapy: N/A 3. Pain Management: N/A 4. Mood: LCSW to follow for evaluation and support             -antipsychotic agents: N/AA 5. Neuropsych: This patient is not capable of making decisions on his own behalf. UA negative 6. Skin/Wound Care:  Routine pressure relief measures. 7. Fluids/Electrolytes/Nutrition: Monitor I/O.  8. H/o CVA with spastic L- HP: No AC due to ICH/IVH. Statin on hold due to bleed.  9.  HTN: Monitor blood pressures 3 times a day.  SBP goal <160. Continue amlodipine twice daily and Avapro daily. BP controlled.  Vitals:   04/16/20 1926 04/17/20 0405  BP: 135/86 130/62  Pulse: 89 69  Resp: 20 20  Temp: 98.3 F (36.8 C) 98.7 F (37.1 C)  SpO2: 96% 96%   10.  Schatzki's ring/Dysphagia: Continue dysphagia 1 with thin liquids.  Offer supplements between meals.  10/4- eating well D1 diet 11. Vascular  dementia: On Aricept and Seroquel at bedtime. Communication limited by aphasia and severe hearing impairment     reduced arousal trial methylphenidate 5mg  BID - cont current dose  12.  Prediabetes: Hgb A1C- 5.7. Monitor fasting BS with intermittent labs. Relatively well controlled.  13.  A fib: Rate controlled 10/6. Will monitor HR tid--off ASA due to recurrent ICH.    LOS: 9 days A FACE TO FACE EVALUATION WAS PERFORMED  Charlett Blake 04/17/2020, 7:44 AM

## 2020-04-18 ENCOUNTER — Inpatient Hospital Stay (HOSPITAL_COMMUNITY): Payer: Medicare Other | Admitting: Occupational Therapy

## 2020-04-18 ENCOUNTER — Inpatient Hospital Stay (HOSPITAL_COMMUNITY): Payer: Medicare Other

## 2020-04-18 NOTE — Progress Notes (Signed)
Occupational Therapy Session Note  Patient Details  Name: Jack [name] MRN: [id number] Date of Birth: [birth year]  Today's Date: 04/18/2020 OT Individual Time: 8341-9622 OT Individual Time Calculation (min): 58 min    Short Term Goals: Week 2:  OT Short Term Goal 1 (Week 2): Continue working on established LTGs set at contact guard assist overall.  Skilled Therapeutic Interventions/Progress Updates:    Pt worked on shower and dressing during session.  He completed functional mobility to the shower with use of the RW and min assist.  Mod instructional cueing needed to navigate the small area in the bathroom and turn around where he could step over to the tub bench with use of the grab bar.  He needed mod assist for removal of dirty clothing and brief.  Shower was completed at overall min assist but with max instructional cueing for sequencing and for applying soap to the washcloth.  He was able to work on dressing sit to stand from the 3:1 in the bathroom as well.  Supervision for donning pullover shirt with mod assist for button up shirt.  He was able to donn his brief and pants with min assist but needed mod assist for socks and shoes.  Finished session with ambulation back out to the wheelchair at the sink with min assist using the RW for support.  He was left sitting up in the wheelchair with the call button and phone in reach and safety belt in place.    Therapy Documentation Precautions:  Precautions Precautions: Fall Precaution Comments: monitor SpO2 Restrictions Weight Bearing Restrictions: No   Pain: Pain Assessment Pain Scale: 0-10 Pain Score: 0-No pain ADL: See Care Tool Section for some details of mobility and selfcare  Therapy/Group: Individual Therapy  Avyana Puffenbarger OTR/L 04/18/2020, 3:51 PM

## 2020-04-18 NOTE — Progress Notes (Signed)
Physical Therapy Session Note  Patient Details  Name: Jack [name] MRN: [id number] Date of Birth: Jul 30, 1933  Today's Date: 04/18/2020 PT Individual Time: 0900-0956 PT Individual Time Calculation (min): 56 min   Short Term Goals: Week 2:  PT Short Term Goal 1 (Week 2): Pt will perform supine<>sit with CGA PT Short Term Goal 2 (Week 2): Pt will perform sit<>stands using LRAD with CGA PT Short Term Goal 3 (Week 2): Pt will perform stand pivot transfers using LRAD with CGA PT Short Term Goal 4 (Week 2): Pt will ambulate at least 101ft using LRAD with CGA PT Short Term Goal 5 (Week 2): Pt will ascend/descend 4 steps using 1 HR with CGA  Skilled Therapeutic Interventions/Progress Updates:    Patient received supine in bed with soaked brief, agreeable to PT assist for clean up. Patient able to roll in bed using bed rails + supervision to complete peri-care. PT providing MaxA for perihygiene and lower body dressing at bed level. CGA provided for patient to come to sitting edge of bed and CGA for stand pivot transfer to wc. PT propelling patient to therapy gym for time management. He was able to ambulate 125ft with FWW and light CGA. He maintains veyr slow gait speed despite verbal cues and tactile cues for increased gait speed. Patient negotiating 4 steps with R HR and MinA and min verbal cues for sequencing. Patient able to complete 10 mins on NuStep for increased LE endurance. Patient returning to room in wc, seatbelt alarm on, call light within reach.   Therapy Documentation Precautions:  Precautions Precautions: Fall Precaution Comments: monitor SpO2 Restrictions Weight Bearing Restrictions: No    Therapy/Group: Individual Therapy  Karoline Caldwell, PT, DPT, CBIS 04/18/2020, 7:37 AM

## 2020-04-18 NOTE — Progress Notes (Addendum)
[facility] PHYSICAL MEDICINE & REHABILITATION PROGRESS NOTE   Subjective/Complaints:  No issues overnite Eating and drinking well per CNA, no pain c/os,   ROS-limited by cognition, very HOH has 1 hearing aide Objective:   No results found. No results for input(s): WBC, HGB, HCT, PLT in the last 72 hours. No results for input(s): NA, K, CL, CO2, GLUCOSE, BUN, CREATININE, CALCIUM in the last 72 hours.  Intake/Output Summary (Last 24 hours) at 04/18/2020 0746 Last data filed at 04/17/2020 1855 Gross per 24 hour  Intake 720 ml  Output 300 ml  Net 420 ml    Physical Exam: Vital Signs Blood pressure (!) 158/74, pulse 73, temperature 97.9 F (36.6 C), resp. rate 20, weight 64.6 kg, SpO2 94 %.    Neurologic: Cranial nerves II through XII intact, motor strength is 4/5 in RIght 3- Left  deltoid, bicep, tricep, grip,3- Left and 4- right  hip flexor, knee extensors, ankle dorsiflexor and plantar flexor Sensory exam normal sensation to light touch and proprioception in bilateral upper and lower extremities Cerebellar exam normal finger to nose to finger as well as heel to shin in bilateral upper and lower extremities Musculoskeletal: Full range of motion in all 4 extremities. No joint swelling     Assessment/Plan: 1. Functional deficits secondary to Left caudate ICH  which require 3+ hours per day of interdisciplinary therapy in a comprehensive inpatient rehab setting.  Physiatrist is providing close team supervision and 24 hour management of active medical problems listed below.  Physiatrist and rehab team continue to assess barriers to discharge/monitor patient progress toward functional and medical goals  Care Tool:  Bathing    Body parts bathed by patient: Right arm, Left arm, Chest, Abdomen, Right upper leg, Left upper leg, Face   Body parts bathed by helper: Right lower leg, Left lower leg Body parts n/a: Front perineal area, Buttocks (Nursing completed prior to session)    Bathing assist Assist Level: Minimal Assistance - Patient > 75%     Upper Body Dressing/Undressing Upper body dressing   What is the patient wearing?: Pull over shirt    Upper body assist Assist Level: Minimal Assistance - Patient > 75%    Lower Body Dressing/Undressing Lower body dressing      What is the patient wearing?: Pants     Lower body assist Assist for lower body dressing: Minimal Assistance - Patient > 75%     Toileting Toileting    Toileting assist Assist for toileting: Maximal Assistance - Patient 25 - 49%     Transfers Chair/bed transfer  Transfers assist     Chair/bed transfer assist level: Minimal Assistance - Patient > 75% Chair/bed transfer assistive device: Armrests, Programmer, multimedia   Ambulation assist   Ambulation activity did not occur: Safety/medical concerns (Patient ambulated 2 steps in // bars)  Assist level: Contact Guard/Touching assist Assistive device: Walker-rolling Max distance: 50   Walk 10 feet activity   Assist  Walk 10 feet activity did not occur: Safety/medical concerns (patient fatigue/weakness)  Assist level: Contact Guard/Touching assist Assistive device: Walker-rolling   Walk 50 feet activity   Assist Walk 50 feet with 2 turns activity did not occur: Safety/medical concerns (patient fatigue/weakness)  Assist level: Contact Guard/Touching assist Assistive device: Walker-rolling    Walk 150 feet activity   Assist Walk 150 feet activity did not occur: Safety/medical concerns (patient fatigue/weakness)  Assist level: Contact Guard/Touching assist Assistive device: Walker-rolling    Walk 10 feet on uneven  surface  activity   Assist Walk 10 feet on uneven surfaces activity did not occur: Safety/medical concerns (patient fatigue/weakness)         Wheelchair     Assist Will patient use wheelchair at discharge?: Yes Type of Wheelchair: Manual    Wheelchair assist level: Total  Assistance - Patient < 25% Max wheelchair distance: 150    Wheelchair 50 feet with 2 turns activity    Assist        Assist Level: Total Assistance - Patient < 25%   Wheelchair 150 feet activity     Assist      Assist Level: Total Assistance - Patient < 25%   Blood pressure (!) 158/74, pulse 73, temperature 97.9 F (36.6 C), resp. rate 20, weight 64.6 kg, SpO2 94 %.  Medical Problem List and Plan: 1.  Impaired mobility and ADLs secondary to L caudate ICH- prior RIght MCA infarct              -patient may shower             -ELOS/Goals: 10/13 MinA but may be lower level based on cognition and arousal   -Continue CIR PT, OT   2.  Antithrombotics: -DVT/anticoagulation:  Pharmaceutical: Heparin             -antiplatelet therapy: N/A 3. Pain Management: N/A 4. Mood: LCSW to follow for evaluation and support             -antipsychotic agents: N/AA 5. Neuropsych: This patient is not capable of making decisions on his own behalf. UA negative 6. Skin/Wound Care:  Routine pressure relief measures. 7. Fluids/Electrolytes/Nutrition: Monitor I/O.  8. H/o CVA with spastic L- HP: No AC due to ICH/IVH. Statin on hold due to bleed.  9.  HTN: Monitor blood pressures 3 times a day.  SBP goal <160. Continue amlodipine twice daily and Avapro daily. BP controlled.  Vitals:   04/17/20 2057 04/18/20 0448  BP:  (!) 158/74  Pulse:  73  Resp:  20  Temp:  97.9 F (36.6 C)  SpO2: 95% 94%  elevated this am, no orthostatic drop, has been normotensive last several days 10.  Schatzki's ring/Dysphagia: Continue dysphagia 1 with thin liquids.  Offer supplements between meals.  10/4- eating well D1 diet 11. Vascular dementia: On Aricept and Seroquel at bedtime. Communication limited by aphasia and severe hearing impairment     reduced arousal trial methylphenidate 5mg  BID - cont current dose  12.  Prediabetes: Hgb A1C- 5.7. Monitor fasting BS with intermittent labs. Relatively well  controlled.  13.  A fib: Rate controlled 10/8. Will monitor HR tid--off ASA due to recurrent ICH.    LOS: 10 days A FACE TO FACE EVALUATION WAS PERFORMED  Charlett Blake 04/18/2020, 7:46 AM

## 2020-04-18 NOTE — Progress Notes (Addendum)
Physical Therapy Session Note  Patient Details  Name: Jack [name] MRN: [id number] Date of Birth: 1934-06-07  Today's Date: 04/18/2020 PT Individual Time: 1104-1200 PT Individual Time Calculation (min): 56 min   Short Term Goals: Week 2:  PT Short Term Goal 1 (Week 2): Pt will perform supine<>sit with CGA PT Short Term Goal 2 (Week 2): Pt will perform sit<>stands using LRAD with CGA PT Short Term Goal 3 (Week 2): Pt will perform stand pivot transfers using LRAD with CGA PT Short Term Goal 4 (Week 2): Pt will ambulate at least 29ft using LRAD with CGA PT Short Term Goal 5 (Week 2): Pt will ascend/descend 4 steps using 1 HR with CGA  Skilled Therapeutic Interventions/Progress Updates:    Session focused on functional mobility re-training with RW, overall endurance/activity tolerance, and functional dynamic balance.  Pt performed transfers with RW throughout session with overall supervision to CGA. Verbal cues for safer technique to maintain RW with him when returning to sit. Pt performed gait training with RW for endurance/strengthening as well as functional mobility training with overall supervision to occasional CGA as fatigued and decreased clearance on LLE noted. Pt does demonstrate decreased coordination and placement of LLE during gait and decreased knee extension in stance. Dynamic gait for challenging of balance, obstacle negotiation, and awareness of environment to simulate home mobility x 2 reps with seated breaks between of going around obstacles in various directions with close supervision to CGA. Stair negotiation training for home entry practice x 2 reps each 4 steps with rail on R and then on L only. Pt required CGA to min assist for stair negotiation with extra time and verbal cues for attention to foot placement and sequencing. Demonstrated increased difficulty with adequate placement/spacing of L foot during descending with use of rail on L only. Use of Kinetron in standing for NMR  and functional strengthening to aid with carryover to stair negotiation on resistance of 70 cm/sec in both seated and standing position with increased difficulty with full weightshift for the L.   Therapy Documentation Precautions:  Precautions Precautions: Fall Precaution Comments: monitor SpO2 Restrictions Weight Bearing Restrictions: No   Pain: No reports of pain.    Therapy/Group: Individual Therapy  Canary Brim Ivory Broad, PT, DPT, CBIS  04/18/2020, 12:04 PM

## 2020-04-19 ENCOUNTER — Inpatient Hospital Stay (HOSPITAL_COMMUNITY): Payer: Medicare Other | Admitting: Occupational Therapy

## 2020-04-19 ENCOUNTER — Inpatient Hospital Stay (HOSPITAL_COMMUNITY): Payer: Medicare Other

## 2020-04-19 NOTE — Progress Notes (Signed)
Physical Therapy Session Note  Patient Details  Name: Jack [name] MRN: [id number] Date of Birth: 05/08/1934  Today's Date: 04/19/2020 PT Individual Time: 0321-2248 PT Individual Time Calculation (min): 58 min   Short Term Goals: Week 2:  PT Short Term Goal 1 (Week 2): Pt will perform supine<>sit with CGA PT Short Term Goal 2 (Week 2): Pt will perform sit<>stands using LRAD with CGA PT Short Term Goal 3 (Week 2): Pt will perform stand pivot transfers using LRAD with CGA PT Short Term Goal 4 (Week 2): Pt will ambulate at least 72ft using LRAD with CGA PT Short Term Goal 5 (Week 2): Pt will ascend/descend 4 steps using 1 HR with CGA  Skilled Therapeutic Interventions/Progress Updates:    Patient received supine in bed asleep, agreeable to PT. He denies pain and states that he has to use the restroom. Patient able to ambulate from bed to toilet with FWW and CGA. Episode of bladder incontinence likely occurred on the way to the toilet. Patient continued urinating into toilet. MaxxA for lower body dressing while seated on toilet. Patient ambulating back to wc in room with FWW and CGA. Hand hygiene performed at sink with verbal cuing. Patient ambulating from room to therapy gym with FWW and close supervision. Gait speed assessed in therapy gym: 0.12m/s, which indicates household ambulation likely and greater dependence for ADLs/IADLs likely. Patient returning to room in wc, seatbelt alarm on, call light within reach. Wife present at bedside asking where 2nd hearing aide was. PT informed her that this PT was made aware that the wife had brought a malfunctioning hearing aide home. Wife stating that she brought that hearing aide back. PT informing wife that hospital staff was not made aware of her bringing hearing aide back to hospital. RN made aware of wifes questions regarding hearing aide location.   Therapy Documentation Precautions:  Precautions Precautions: Fall Precaution Comments: monitor  SpO2 Restrictions Weight Bearing Restrictions: No    Therapy/Group: Individual Therapy  Karoline Caldwell, PT, DPT, CBIS 04/19/2020, 7:41 AM

## 2020-04-19 NOTE — Progress Notes (Signed)
Occupational Therapy Session Note  Patient Details  Name: Jack [name] MRN: [id number] Date of Birth: 01/31/1934  Today's Date: 04/19/2020 OT Individual Time: 5374-8270 OT Individual Time Calculation (min): 30 min    Short Term Goals: Week 2:  OT Short Term Goal 1 (Week 2): Continue working on established LTGs set at contact guard assist overall.  Skilled Therapeutic Interventions/Progress Updates:    Pt completed work on LUE FM coordination and reach during session from the therapy mat.  He was able to pick up and place large 2-3" round pegs in the peg board at chest level with increased time and supervision.  Min to mod demonstrational cueing to avoid leaning to the right when reaching up to the table.  Progressed to smaller 1-1 1/2 " pegs with greater difficulty and occasional drops when attempting to manipulate them and place them in the peg holes.  He completed transfer to and from the mat during session with min assist using the RW for support.  Returned to the room at the end of the session with pt left sitting in the wheelchair with his spouse beside of him.  Call button and phone in reach.    Therapy Documentation Precautions:  Precautions Precautions: Fall Precaution Comments: monitor SpO2 Restrictions Weight Bearing Restrictions: No  Pain: Pain Assessment Pain Scale: Faces Pain Score: 0-No pain ADL: See Care Tool Section for some details of mobility and selfcare  Therapy/Group: Individual Therapy  Yohance Hathorne OTR/L 04/19/2020, 4:27 PM

## 2020-04-19 NOTE — Progress Notes (Signed)
[place] PHYSICAL MEDICINE & REHABILITATION PROGRESS NOTE   Subjective/Complaints:  Pt reports no issues- slept OK- denies pain- "regular BMs".   Has telesitter-   ROS-limited by cognition/HOH Objective:   No results found. No results for input(s): WBC, HGB, HCT, PLT in the last 72 hours. No results for input(s): NA, K, CL, CO2, GLUCOSE, BUN, CREATININE, CALCIUM in the last 72 hours.  Intake/Output Summary (Last 24 hours) at 04/19/2020 1025 Last data filed at 04/19/2020 0700 Gross per 24 hour  Intake 840 ml  Output 725 ml  Net 115 ml    Physical Exam: Vital Signs Blood pressure 123/62, pulse 68, temperature 98.2 F (36.8 C), resp. rate 16, weight 64.4 kg, SpO2 97 %.  Gen: laying on L side in bed- asleep, but woke to tactile stimuli, NAD CV: RRR, no JVD Pulm: CTA B/L- no W/R/R- good air movement GI: Soft, NT, ND, (+)BS   Neurologic: Cranial nerves II through XII intact, motor strength is 4/5 in RIght 3- Left  deltoid, bicep, tricep, grip,3- Left and 4- right  hip flexor, knee extensors, ankle dorsiflexor and plantar flexor Sensory exam normal sensation to light touch and proprioception in bilateral upper and lower extremities Cerebellar exam normal finger to nose to finger as well as heel to shin in bilateral upper and lower extremities Musculoskeletal: Full range of motion in all 4 extremities. No joint swelling     Assessment/Plan: 1. Functional deficits secondary to Left caudate ICH  which require 3+ hours per day of interdisciplinary therapy in a comprehensive inpatient rehab setting.  Physiatrist is providing close team supervision and 24 hour management of active medical problems listed below.  Physiatrist and rehab team continue to assess barriers to discharge/monitor patient progress toward functional and medical goals  Care Tool:  Bathing    Body parts bathed by patient: Right arm, Left arm, Chest, Abdomen, Right upper leg, Left upper leg, Face, Front  perineal area   Body parts bathed by helper: Buttocks, Right lower leg, Left lower leg Body parts n/a: Front perineal area, Buttocks (Nursing completed prior to session)   Bathing assist Assist Level: Minimal Assistance - Patient > 75%     Upper Body Dressing/Undressing Upper body dressing   What is the patient wearing?: Pull over shirt, Button up shirt    Upper body assist Assist Level: Moderate Assistance - Patient 50 - 74%    Lower Body Dressing/Undressing Lower body dressing      What is the patient wearing?: Pants, Incontinence brief     Lower body assist Assist for lower body dressing: Moderate Assistance - Patient 50 - 74%     Toileting Toileting    Toileting assist Assist for toileting: Maximal Assistance - Patient 25 - 49%     Transfers Chair/bed transfer  Transfers assist     Chair/bed transfer assist level: Contact Guard/Touching assist Chair/bed transfer assistive device: Armrests, Walker   Locomotion Ambulation   Ambulation assist   Ambulation activity did not occur: Safety/medical concerns (Patient ambulated 2 steps in // bars)  Assist level: Contact Guard/Touching assist Assistive device: Walker-rolling Max distance: 120'   Walk 10 feet activity   Assist  Walk 10 feet activity did not occur: Safety/medical concerns (patient fatigue/weakness)  Assist level: Contact Guard/Touching assist Assistive device: Walker-rolling   Walk 50 feet activity   Assist Walk 50 feet with 2 turns activity did not occur: Safety/medical concerns (patient fatigue/weakness)  Assist level: Contact Guard/Touching assist Assistive device: Walker-rolling    Walk  150 feet activity   Assist Walk 150 feet activity did not occur: Safety/medical concerns (patient fatigue/weakness)  Assist level: Contact Guard/Touching assist Assistive device: Walker-rolling    Walk 10 feet on uneven surface  activity   Assist Walk 10 feet on uneven surfaces activity did  not occur: Safety/medical concerns (patient fatigue/weakness)         Wheelchair     Assist Will patient use wheelchair at discharge?: Yes Type of Wheelchair: Manual    Wheelchair assist level: Total Assistance - Patient < 25% Max wheelchair distance: 150    Wheelchair 50 feet with 2 turns activity    Assist        Assist Level: Total Assistance - Patient < 25%   Wheelchair 150 feet activity     Assist      Assist Level: Total Assistance - Patient < 25%   Blood pressure 123/62, pulse 68, temperature 98.2 F (36.8 C), resp. rate 16, weight 64.4 kg, SpO2 97 %.  Medical Problem List and Plan: 1.  Impaired mobility and ADLs secondary to L caudate ICH- prior RIght MCA infarct              -patient may shower             -ELOS/Goals: 10/13 MinA but may be lower level based on cognition and arousal  10/9- con't telesitter for pt.   -Continue CIR PT, OT 2.  Antithrombotics: -DVT/anticoagulation:  Pharmaceutical: Heparin             -antiplatelet therapy: N/A 3. Pain Management: N/A 4. Mood: LCSW to follow for evaluation and support             -antipsychotic agents: N/AA 5. Neuropsych: This patient is not capable of making decisions on his own behalf. UA negative 6. Skin/Wound Care:  Routine pressure relief measures. 7. Fluids/Electrolytes/Nutrition: Monitor I/O.  8. H/o CVA with spastic L- HP: No AC due to ICH/IVH. Statin on hold due to bleed.  9.  HTN: Monitor blood pressures 3 times a day.  SBP goal <160. Continue amlodipine twice daily and Avapro daily. BP controlled.  Vitals:   04/18/20 1856 04/19/20 0420  BP: 125/64 123/62  Pulse: 86 68  Resp: (!) 22 16  Temp: 98.5 F (36.9 C) 98.2 F (36.8 C)  SpO2: 96% 97%  elevated this am, no orthostatic drop, has been normotensive last several days  10/9- BP 120s/60s- doing well- con't regimen 10.  Schatzki's ring/Dysphagia: Continue dysphagia 1 with thin liquids.  Offer supplements between meals.  10/4-  eating well D1 diet 11. Vascular dementia: On Aricept and Seroquel at bedtime. Communication limited by aphasia and severe hearing impairment     reduced arousal trial methylphenidate 5mg  BID - cont current dose  12.  Prediabetes: Hgb A1C- 5.7. Monitor fasting BS with intermittent labs. Relatively well controlled.  13.  A fib: Rate controlled 10/8. Will monitor HR tid--off ASA due to recurrent ICH.  10/9- sounded RRR/rate controlled this AM    LOS: 11 days A FACE TO FACE EVALUATION WAS PERFORMED  Margerite Impastato 04/19/2020, 10:25 AM

## 2020-04-20 NOTE — Progress Notes (Signed)
[place] PHYSICAL MEDICINE & REHABILITATION PROGRESS NOTE   Subjective/Complaints:  Pt reports no issues- about to eat with nursing set up Has telesitter Doesn't remember meeting me yesterday.     ROS: limited by cognition/HOH Objective:   No results found. No results for input(s): WBC, HGB, HCT, PLT in the last 72 hours. No results for input(s): NA, K, CL, CO2, GLUCOSE, BUN, CREATININE, CALCIUM in the last 72 hours.  Intake/Output Summary (Last 24 hours) at 04/20/2020 1042 Last data filed at 04/19/2020 2115 Gross per 24 hour  Intake 597 ml  Output 125 ml  Net 472 ml    Physical Exam: Vital Signs Blood pressure 120/63, pulse 77, temperature 98.2 F (36.8 C), temperature source Oral, resp. rate 15, weight 64.4 kg, SpO2 94 %.  Gen: sitting up, getting set up for eating breakfast, NAD CV: RRR Pulm: CTA B/L- no W/R/R- good air movement GI: Soft, NT, ND, (+)BS   Neurologic: Cranial nerves II through XII intact, motor strength is 4/5 in RIght 3- Left  deltoid, bicep, tricep, grip,3- Left and 4- right  hip flexor, knee extensors, ankle dorsiflexor and plantar flexor Sensory exam normal sensation to light touch and proprioception in bilateral upper and lower extremities Cerebellar exam normal finger to nose to finger as well as heel to shin in bilateral upper and lower extremities Musculoskeletal: Full range of motion in all 4 extremities. No joint swelling     Assessment/Plan: 1. Functional deficits secondary to Left caudate ICH  which require 3+ hours per day of interdisciplinary therapy in a comprehensive inpatient rehab setting.  Physiatrist is providing close team supervision and 24 hour management of active medical problems listed below.  Physiatrist and rehab team continue to assess barriers to discharge/monitor patient progress toward functional and medical goals  Care Tool:  Bathing    Body parts bathed by patient: Right arm, Left arm, Chest, Abdomen, Right  upper leg, Left upper leg, Face, Front perineal area   Body parts bathed by helper: Buttocks, Right lower leg, Left lower leg Body parts n/a: Front perineal area, Buttocks (Nursing completed prior to session)   Bathing assist Assist Level: Minimal Assistance - Patient > 75%     Upper Body Dressing/Undressing Upper body dressing   What is the patient wearing?: Pull over shirt, Button up shirt    Upper body assist Assist Level: Moderate Assistance - Patient 50 - 74%    Lower Body Dressing/Undressing Lower body dressing      What is the patient wearing?: Pants, Incontinence brief     Lower body assist Assist for lower body dressing: Moderate Assistance - Patient 50 - 74%     Toileting Toileting    Toileting assist Assist for toileting: Maximal Assistance - Patient 25 - 49%     Transfers Chair/bed transfer  Transfers assist     Chair/bed transfer assist level: Contact Guard/Touching assist Chair/bed transfer assistive device: Armrests, Walker   Locomotion Ambulation   Ambulation assist   Ambulation activity did not occur: Safety/medical concerns (Patient ambulated 2 steps in // bars)  Assist level: Supervision/Verbal cueing Assistive device: Walker-rolling Max distance: 150   Walk 10 feet activity   Assist  Walk 10 feet activity did not occur: Safety/medical concerns (patient fatigue/weakness)  Assist level: Supervision/Verbal cueing Assistive device: Walker-rolling   Walk 50 feet activity   Assist Walk 50 feet with 2 turns activity did not occur: Safety/medical concerns (patient fatigue/weakness)  Assist level: Supervision/Verbal cueing Assistive device: Walker-rolling  Walk 150 feet activity   Assist Walk 150 feet activity did not occur: Safety/medical concerns (patient fatigue/weakness)  Assist level: Supervision/Verbal cueing Assistive device: Walker-rolling    Walk 10 feet on uneven surface  activity   Assist Walk 10 feet on uneven  surfaces activity did not occur: Safety/medical concerns (patient fatigue/weakness)         Wheelchair     Assist Will patient use wheelchair at discharge?: Yes Type of Wheelchair: Manual    Wheelchair assist level: Total Assistance - Patient < 25% Max wheelchair distance: 150    Wheelchair 50 feet with 2 turns activity    Assist        Assist Level: Total Assistance - Patient < 25%   Wheelchair 150 feet activity     Assist      Assist Level: Total Assistance - Patient < 25%   Blood pressure 120/63, pulse 77, temperature 98.2 F (36.8 C), temperature source Oral, resp. rate 15, weight 64.4 kg, SpO2 94 %.  Medical Problem List and Plan: 1.  Impaired mobility and ADLs secondary to L caudate ICH- prior RIght MCA infarct              -patient may shower             -ELOS/Goals: 10/13 MinA but may be lower level based on cognition and arousal  10/10- con't telesitter- pt's memory is very poor  -Continue CIR PT, OT 2.  Antithrombotics: -DVT/anticoagulation:  Pharmaceutical: Heparin             -antiplatelet therapy: N/A 3. Pain Management: N/A 4. Mood: LCSW to follow for evaluation and support             -antipsychotic agents: N/AA 5. Neuropsych: This patient is not capable of making decisions on his own behalf. UA negative 6. Skin/Wound Care:  Routine pressure relief measures. 7. Fluids/Electrolytes/Nutrition: Monitor I/O.  8. H/o CVA with spastic L- HP: No AC due to ICH/IVH. Statin on hold due to bleed.  9.  HTN: Monitor blood pressures 3 times a day.  SBP goal <160. Continue amlodipine twice daily and Avapro daily. BP controlled.  Vitals:   04/19/20 1910 04/20/20 0352  BP: (!) 127/59 120/63  Pulse: 81 77  Resp: 16 15  Temp: 98.5 F (36.9 C) 98.2 F (36.8 C)  SpO2: 97% 94%  elevated this am, no orthostatic drop, has been normotensive last several days  10/10- BP great 120s/60s- con't regimen 10.  Schatzki's ring/Dysphagia: Continue dysphagia 1  with thin liquids.  Offer supplements between meals.  10/4- eating well D1 diet 11. Vascular dementia: On Aricept and Seroquel at bedtime. Communication limited by aphasia and severe hearing impairment     reduced arousal trial methylphenidate 5mg  BID - cont current dose  12.  Prediabetes: Hgb A1C- 5.7. Monitor fasting BS with intermittent labs. Relatively well controlled.  13.  A fib: Rate controlled 10/8. Will monitor HR tid--off ASA due to recurrent ICH.  10/9- sounded RRR/rate controlled this AM    LOS: 12 days A FACE TO [place] 04/20/2020, 10:42 AM

## 2020-04-21 ENCOUNTER — Inpatient Hospital Stay (HOSPITAL_COMMUNITY): Payer: Medicare Other

## 2020-04-21 ENCOUNTER — Inpatient Hospital Stay (HOSPITAL_COMMUNITY): Payer: Medicare Other | Admitting: Speech Pathology

## 2020-04-21 ENCOUNTER — Inpatient Hospital Stay (HOSPITAL_COMMUNITY): Payer: Medicare Other | Admitting: Occupational Therapy

## 2020-04-21 LAB — BASIC METABOLIC PANEL
Anion gap: 9 (ref 5–15)
BUN: 15 mg/dL (ref 8–23)
CO2: 25 mmol/L (ref 22–32)
Calcium: 9 mg/dL (ref 8.9–10.3)
Chloride: 105 mmol/L (ref 98–111)
Creatinine, Ser: 0.85 mg/dL (ref 0.61–1.24)
GFR, Estimated: >60 mL/min (ref >60)
Glucose, Bld: 89 mg/dL (ref 70–99)
Potassium: 4 mmol/L (ref 3.5–5.1)
Sodium: 139 mmol/L (ref 135–145)

## 2020-04-21 LAB — CBC
HCT: 36.1 % — ABNORMAL LOW (ref 39.0–52.0)
Hemoglobin: 11.8 g/dL — ABNORMAL LOW (ref 13.0–17.0)
MCH: 29.9 pg (ref 26.0–34.0)
MCHC: 32.7 g/dL (ref 30.0–36.0)
MCV: 91.4 fL (ref 80.0–100.0)
Platelets: 324 10*3/uL (ref 150–400)
RBC: 3.95 MIL/uL — ABNORMAL LOW (ref 4.22–5.81)
RDW: 15.2 % (ref 11.5–15.5)
WBC: 8 10*3/uL (ref 4.0–10.5)
nRBC: 0 % (ref 0.0–0.2)

## 2020-04-21 NOTE — Progress Notes (Signed)
[place] PHYSICAL MEDICINE & REHABILITATION PROGRESS NOTE   Subjective/Complaints: Eating breakfast with SLP supervision , may be upgraded to int Sup with D3  Incont Bowel and bladder , soft stool    ROS: limited by cognition/HOH Objective:   No results found. No results for input(s): WBC, HGB, HCT, PLT in the last 72 hours. No results for input(s): NA, K, CL, CO2, GLUCOSE, BUN, CREATININE, CALCIUM in the last 72 hours.  Intake/Output Summary (Last 24 hours) at 04/21/2020 0743 Last data filed at 04/20/2020 2100 Gross per 24 hour  Intake 500 ml  Output 280 ml  Net 220 ml    Physical Exam: Vital Signs Blood pressure 131/69, pulse 69, temperature 99.1 F (37.3 C), temperature source Oral, resp. rate 20, weight 63.9 kg, SpO2 94 %.   General: No acute distress Mood and affect are appropriate Heart: Regular rate and rhythm no rubs murmurs or extra sounds Lungs: Clear to auscultation, breathing unlabored, no rales or wheezes Abdomen: Positive bowel sounds, soft nontender to palpation, nondistended Extremities: No clubbing, cyanosis, or edema Skin: No evidence of breakdown, no evidence of rash  Neurologic: Cranial nerves II through XII intact, motor strength is 4/5 in RIght 3- Left  deltoid, bicep, tricep, grip,3- Left and 4- right  hip flexor, knee extensors, ankle dorsiflexor and plantar flexor Sensory exam normal sensation to light touch and proprioception in bilateral upper and lower extremities Cerebellar exam normal finger to nose to finger as well as heel to shin in bilateral upper and lower extremities Musculoskeletal: Full range of motion in all 4 extremities. No joint swelling     Assessment/Plan: 1. Functional deficits secondary to Left caudate ICH  which require 3+ hours per day of interdisciplinary therapy in a comprehensive inpatient rehab setting.  Physiatrist is providing close team supervision and 24 hour management of active medical problems listed  below.  Physiatrist and rehab team continue to assess barriers to discharge/monitor patient progress toward functional and medical goals  Care Tool:  Bathing    Body parts bathed by patient: Right arm, Left arm, Chest, Abdomen, Right upper leg, Left upper leg, Face, Front perineal area   Body parts bathed by helper: Buttocks, Right lower leg, Left lower leg Body parts n/a: Front perineal area, Buttocks (Nursing completed prior to session)   Bathing assist Assist Level: Minimal Assistance - Patient > 75%     Upper Body Dressing/Undressing Upper body dressing   What is the patient wearing?: Pull over shirt, Button up shirt    Upper body assist Assist Level: Moderate Assistance - Patient 50 - 74%    Lower Body Dressing/Undressing Lower body dressing      What is the patient wearing?: Pants, Incontinence brief     Lower body assist Assist for lower body dressing: Moderate Assistance - Patient 50 - 74%     Toileting Toileting    Toileting assist Assist for toileting: Maximal Assistance - Patient 25 - 49%     Transfers Chair/bed transfer  Transfers assist     Chair/bed transfer assist level: Contact Guard/Touching assist Chair/bed transfer assistive device: Armrests, Walker   Locomotion Ambulation   Ambulation assist   Ambulation activity did not occur: Safety/medical concerns (Patient ambulated 2 steps in // bars)  Assist level: Supervision/Verbal cueing Assistive device: Walker-rolling Max distance: 150   Walk 10 feet activity   Assist  Walk 10 feet activity did not occur: Safety/medical concerns (patient fatigue/weakness)  Assist level: Supervision/Verbal cueing Assistive device: Walker-rolling   Walk  50 feet activity   Assist Walk 50 feet with 2 turns activity did not occur: Safety/medical concerns (patient fatigue/weakness)  Assist level: Supervision/Verbal cueing Assistive device: Walker-rolling    Walk 150 feet activity   Assist Walk  150 feet activity did not occur: Safety/medical concerns (patient fatigue/weakness)  Assist level: Supervision/Verbal cueing Assistive device: Walker-rolling    Walk 10 feet on uneven surface  activity   Assist Walk 10 feet on uneven surfaces activity did not occur: Safety/medical concerns (patient fatigue/weakness)         Wheelchair     Assist Will patient use wheelchair at discharge?: Yes Type of Wheelchair: Manual    Wheelchair assist level: Total Assistance - Patient < 25% Max wheelchair distance: 150    Wheelchair 50 feet with 2 turns activity    Assist        Assist Level: Total Assistance - Patient < 25%   Wheelchair 150 feet activity     Assist      Assist Level: Total Assistance - Patient < 25%   Blood pressure 131/69, pulse 69, temperature 99.1 F (37.3 C), temperature source Oral, resp. rate 20, weight 63.9 kg, SpO2 94 %.  Medical Problem List and Plan: 1.  Impaired mobility and ADLs secondary to L caudate ICH- prior RIght MCA infarct              -patient may shower             -ELOS/Goals: 10/13 MinA but may be lower at times based on cognition and arousal  10/10- con't telesitter-   -Continue CIR PT, OT 2.  Antithrombotics: -DVT/anticoagulation:  Pharmaceutical: Heparin             -antiplatelet therapy: N/A 3. Pain Management: N/A 4. Mood: LCSW to follow for evaluation and support             -antipsychotic agents: N/AA 5. Neuropsych: This patient is not capable of making decisions on his own behalf. UA negative 6. Skin/Wound Care:  Routine pressure relief measures. 7. Fluids/Electrolytes/Nutrition: Monitor I/O.  8. H/o CVA with spastic L- HP: No AC due to ICH/IVH. Statin on hold due to bleed.  9.  HTN: Monitor blood pressures 3 times a day.  SBP goal <160. Continue amlodipine twice daily and Avapro daily. BP controlled.  Vitals:   04/21/20 0407 04/21/20 0410  BP: 137/69 131/69  Pulse: 72 69  Resp: 20   Temp: 99.1 F (37.3  C)   SpO2: 95% 94%  normotensive 10/11 10.  Schatzki's ring/Dysphagia: Continue dysphagia 1 with thin liquids.  Offer supplements between meals.  10/4- eating well D1 diet 11. Vascular dementia: On Aricept and Seroquel at bedtime. Communication limited by aphasia and severe hearing impairment     reduced arousal trial methylphenidate 5mg  BID - cont current dose  12.  Prediabetes: Hgb A1C- 5.7. Monitor fasting BS with intermittent labs. Relatively well controlled.  13.  A fib: Rate controlled 10/11. Will monitor HR tid--off ASA due to recurrent ICH.  10/9- sounded RRR/rate controlled this AM    LOS: 13 days A FACE TO [place]ha E Rolla Kedzierski 04/21/2020, 7:43 AM

## 2020-04-21 NOTE — Progress Notes (Signed)
Pt having audible wheezing, diminished lung sounds bilaterally, Labored and tachypneic, RR 25. Pt denies feeling SOB. PRN Atrovent given

## 2020-04-21 NOTE — Progress Notes (Signed)
Occupational Therapy Session Note  Patient Details  Name: Jack [name] MRN: [id number] Date of Birth: Nov 20, 1933  Today's Date: 04/21/2020 OT Individual Time: 8270-7867 OT Individual Time Calculation (min): 70 min    Short Term Goals: Week 2:  OT Short Term Goal 1 (Week 2): Continue working on established LTGs set at contact guard assist overall.  Skilled Therapeutic Interventions/Progress Updates:    Pt worked on shower, dressing, and toileting with spouse in for family education.  He transferred to the shower bench with use of the RW and min guard assist from the wife.  Educated her on use of the gait belt with mobility unless taken a few steps from the toilet to the shower seat, then she could provide physical assist without the belt.  He was able to remove dirty clothing with overall mod assist.  He completed bathing sit to stand with min guard using the grab bars for support, but needed mod to max instructional cueing for thoroughness and sequencing.  He did not attempt use of the soap on the washcloth or for washing his hair, until cued by the therapist.  He dried off sit to stand at the same level and then transferred over to a 3:1 for dressing.  Before sitting, he demonstrated bowel incontinence and was aware that he was going.  However, the 3:1 he was going to sit on had a towel over it and was not over the toilet at the time.  He transferred over to the 3:1 over the toilet with mod demonstrational cueing and min assist to finish toileting task.  Mod assist for thoroughness to complete toilet hygiene in standing.  He next, worked on dressing sit to stand with mod assist for both UB and LB overall.  Instructed his spouse to allow him time to process and try tasks, then assist as needed.  She demonstrated good ability to assist him with dressing tasks.  Finished session with ambulation out out to his wheelchair where he sat and completed brushing his hair with setup.  Pt left with spouse in room  and call button and phone in reach.  Safety belt in place as well.   Therapy Documentation Precautions:  Precautions Precautions: Fall Precaution Comments: monitor SpO2 Restrictions Weight Bearing Restrictions: No  Pain: Pain Assessment Pain Scale: Faces Pain Score: 0-No pain ADL: See Care Tool Section for some details of mobility and selfcare  Therapy/Group: Individual Therapy  Francille Wittmann OTR/L 04/21/2020, 3:54 PM

## 2020-04-21 NOTE — Progress Notes (Signed)
Speech Language Pathology Daily Session Note  Patient Details  Name: Jack [name] MRN: [id number] Date of Birth: [birth year]  Today's Date: 04/21/2020 SLP Individual Time: 0730-0825 SLP Individual Time Calculation (min): 55 min  Short Term Goals: Week 2: SLP Short Term Goal 1 (Week 2): STG=LTG due to estimated remaining length of stay  Skilled Therapeutic Interventions: Pt was seen for skilled ST targeting dysphagia and cognitive goals. SLP provided Min A verbal and visual cues for initiation of self feeding current Dys 2 breakfast and thin liquids. His mastication and oral clearance of Dys 2 solids was efficient, and no overt s/sx aspiration noted. SLP also provided advanced trial of Dys 3 solids, and there was no appreciable difference noted in efficiency of mastication or oral clearance. He used all swallow precautions and compensatory strategies with Supervision A verbal cues. Would recommend pt upgrade to Dys 3 (mechanical soft) textures, continue thin liquids, medications may be administered whole in purees, and full supervision during meals due to cognition. SLP further facilitated session with a basic weekly weather forecast task, which pt required Moderate verbal and visual cues for problem solving and Min A repetition cues for comprehension of basic information. He also used his memory notebook during session and recalled events of ST to record in it today with Min-Mod question cues. Pt left laying in bed with alarm set and needs within reach. Continue per current plan of care.          Pain Pain Assessment Pain Scale: 0-10 Pain Score: 0-No pain  Therapy/Group: Individual Therapy  Arbutus Leas 04/21/2020, 9:42 AM

## 2020-04-21 NOTE — Progress Notes (Signed)
Physical Therapy Note  Patient Details  Name: JAMESON TORMEY MRN: [id number] Date of Birth: 12-13-1933 Today's Date: 04/21/2020    Recommending the following equipment to increase functional independence with mobility: ~ standard 16x16 wheelchair with standard leg rests   Patient measurements:    Hip Width: 16    Seat Depth (-2"): 16   W/c cushion: gel cushion due to incontinence (16x16)        Debbora Dus 04/21/2020, 9:34 AM

## 2020-04-21 NOTE — Progress Notes (Signed)
Physical Therapy Session Note  Patient Details  Name: Jack [name] MRN: [id number] Date of Birth: [birth year]  Today's Date: 04/21/2020 PT Individual Time: 0901-1000 PT Individual Time Calculation (min): 59 min   Short Term Goals: Week 2:  PT Short Term Goal 1 (Week 2): Pt will perform supine<>sit with CGA PT Short Term Goal 2 (Week 2): Pt will perform sit<>stands using LRAD with CGA PT Short Term Goal 3 (Week 2): Pt will perform stand pivot transfers using LRAD with CGA PT Short Term Goal 4 (Week 2): Pt will ambulate at least 67ft using LRAD with CGA PT Short Term Goal 5 (Week 2): Pt will ascend/descend 4 steps using 1 HR with CGA  Skilled Therapeutic Interventions/Progress Updates:    Patient received supine in bed agreeable to PT. He denies pain. MaxA provided for bed level pericare and lower body dressing due to soiled brief. He is able to roll using bed rails and supervision. CGA provided for supine > sit transition to edge of bed. CGA provided for stand pivot transfer to wc. PT propelling patient in wc to therapy gym for time management and energy conservation. He was able to negotiate 4 steps using R HR and MinA and negotiate 4 steps using L HR and CGA. Patient able to ambulate ~111ft with FWW and supervision. He maintains slow gait speed despite verbal cuing for faster gait speed. Patient able to transfer into car with MinA and verbal cuing for process. Patient returning to room in wc, seatbelt alarm on, call light within reach.   Therapy Documentation Precautions:  Precautions Precautions: Fall Precaution Comments: monitor SpO2 Restrictions Weight Bearing Restrictions: No    Therapy/Group: Individual Therapy  Karoline Caldwell, PT, DPT, CBIS 04/21/2020, 7:42 AM

## 2020-04-21 NOTE — Progress Notes (Signed)
Patient ID: Jack [name], male   DOB: 1933/08/25, 86 y.o.   MRN: [id number]   Patient wheelchair ordered through [facility] [place].  [place], [place]

## 2020-04-21 NOTE — Progress Notes (Signed)
Physical Therapy Discharge Summary  Patient Details  Name: Jack [name] MRN: [id number] Date of Birth: 03/27/1934   Patient has met 6 of 10 long term goals and partially met 4 of 10 LTGs due to improved activity tolerance, improved balance, improved postural control, increased strength, ability to compensate for deficits, improved attention, improved awareness, and improved coordination.  Patient to discharge at an ambulatory level Supervision.   Patient's care partner is independent to provide the necessary physical and cognitive assistance at discharge.  Reasons goals not met: Pt required CGA with furniture transfers, min assist w/car transfer and ws able to ambulate 56f w/supervision in home type environment due vs 578fas set in goal.  His wife was able to demonstrate safe guarding of pt with these activities. These were all adequate for DC.  He was able to ascend/descend 8 stairs w/2 rails vs 12, but wife was educated to use chair at landing for rest break before completing full 12 stairs.  Therefore, all were adequate for DC.    Recommendation:  Patient will benefit from ongoing skilled PT services in home health setting to continue to advance safe functional mobility, address ongoing impairments in safety awareness, B LE strength, coordination, gait tx, endurance, balance, and minimize fall risk.  Equipment: 16x16 wc, patient owns FWWoolstockReasons for discharge: treatment goals met and discharge from hospital  Patient/family agrees with progress made and goals achieved: Yes  PT Discharge Precautions/Restrictions Precautions Precautions: Fall Precaution Comments: monitor SpO2 Restrictions Weight Bearing Restrictions: No Vision/Perception  Perception Perception: Within Functional Limits Praxis Praxis: Impaired Praxis Impairment Details: Initiation;Motor planning  Cognition Overall Cognitive Status: Impaired/Different from baseline Arousal/Alertness: Awake/alert Awareness:  Impaired Problem Solving: Impaired Initiating: Impaired Safety/Judgment: Impaired Sensation Sensation Light Touch: Appears Intact Hot/Cold: Not tested Proprioception: Appears Intact Stereognosis: Appears Intact Coordination Gross Motor Movements are Fluid and Coordinated: No Fine Motor Movements are Fluid and Coordinated: No Finger Nose Finger Test: slight dysmetria on L UE Heel Shin Test: Dysmetria to L LE Motor  Motor Motor: Hemiplegia Motor - Skilled Clinical Observations: Hx of left hemiparesis from previous CVA  Mobility Bed Mobility Bed Mobility: Rolling Right;Rolling Left;Supine to Sit;Sit to Supine Rolling Right: Independent with assistive device Rolling Left: Independent with assistive device Supine to Sit: Contact Guard/Touching assist Sitting - Scoot to Edge of Bed: Supervision/Verbal cueing Sit to Supine: Supervision/Verbal cueing Transfers Transfers: Sit to Stand;Stand to Sit;Stand Pivot Transfers Sit to Stand: Supervision/Verbal cueing Stand to Sit: Supervision/Verbal cueing Stand Pivot Transfers: Contact Guard/Touching assist Stand Pivot Transfer Details: Verbal cues for sequencing;Verbal cues for technique;Verbal cues for precautions/safety Transfer (Assistive device): Rolling walker Locomotion  Gait Ambulation: Yes Gait Assistance: Supervision/Verbal cueing Gait Distance (Feet): 150 Feet Assistive device: Rolling walker Gait Assistance Details: Verbal cues for gait pattern Gait Gait: Yes Gait Pattern: Impaired Gait Pattern: Step-to pattern;Shuffle;Lateral trunk lean to left;Narrow base of support;Poor foot clearance - right;Poor foot clearance - left;Decreased trunk rotation Stairs / Additional Locomotion Stairs: Yes Stairs Assistance: Contact Guard/Touching assist Stair Management Technique: One rail Right Number of Stairs: 12 Height of Stairs: 6 Ramp: Contact Guard/touching assist Curb: CoDiplomatic Services operational officerobility: Yes Wheelchair Assistance: CoDevelopment worker, international aidBoth upper extremities Wheelchair Parts Management: Needs assistance Distance: 150  Trunk/Postural Assessment  Cervical Assessment Cervical Assessment: Exceptions to WFCheyenne Regional Medical Centerhoracic Assessment Thoracic Assessment: Exceptions to WFPasadena Surgery Center LLCumbar Assessment Lumbar Assessment: Exceptions to WFUniversity Hospitals Avon Rehabilitation Hospitalostural Control Postural Control: Deficits on evaluation Trunk Control: slight posterior lean upon initially sitting up  Righting Reactions: delayed and inadequate Protective Responses: delayed and inadequate  Balance Balance Balance Assessed: Yes Standardized Balance Assessment Standardized Balance Assessment: Timed Up and Go Test (Gait speed: 0.71ms) Timed Up and Go Test TUG: Normal TUG Normal TUG (seconds): 57 Static Sitting Balance Static Sitting - Balance Support: Feet supported Static Sitting - Level of Assistance: 6: Modified independent (Device/Increase time) Dynamic Sitting Balance Dynamic Sitting - Balance Support: During functional activity Dynamic Sitting - Level of Assistance: 5: Stand by assistance Static Standing Balance Static Standing - Balance Support: During functional activity Static Standing - Level of Assistance: 6: Modified independent (Device/Increase time) Dynamic Standing Balance Dynamic Standing - Balance Support: During functional activity Dynamic Standing - Level of Assistance: 5: Stand by assistance Extremity Assessment      RLE Assessment RLE Assessment: Exceptions to WMainegeneral Medical CenterRLE Strength Right Hip Flexion: 3+/5 Right Hip ABduction: 4-/5 Right Hip ADduction: 4-/5 Right Knee Flexion: 3+/5 Right Knee Extension: 4-/5 Right Ankle Dorsiflexion: 3+/5 Right Ankle Plantar Flexion: 3+/5 LLE Assessment LLE Assessment: Exceptions to WUrology Surgery Center LPLLE Strength Left Hip Flexion: 3/5 Left Hip ABduction: 3+/5 Left Hip ADduction: 3+/5 Left Knee Flexion: 3+/5 Left Knee  Extension: 3+/5 Left Ankle Dorsiflexion: 3+/5 Left Ankle Plantar Flexion: 3+/5    JDebbora Dus10/05/2020, 7:59 AM

## 2020-04-22 ENCOUNTER — Inpatient Hospital Stay (HOSPITAL_COMMUNITY): Payer: Medicare Other

## 2020-04-22 ENCOUNTER — Inpatient Hospital Stay (HOSPITAL_COMMUNITY): Payer: Medicare Other | Admitting: Speech Pathology

## 2020-04-22 ENCOUNTER — Inpatient Hospital Stay (HOSPITAL_COMMUNITY): Payer: Medicare Other | Admitting: Occupational Therapy

## 2020-04-22 ENCOUNTER — Ambulatory Visit (HOSPITAL_COMMUNITY): Payer: Medicare Other

## 2020-04-22 MED ORDER — TAMSULOSIN HCL 0.4 MG PO CAPS: 0.4000 mg | ORAL_CAPSULE | Freq: Every day | ORAL | 0 refills | Status: DC

## 2020-04-22 MED ORDER — PANTOPRAZOLE SODIUM 40 MG PO TBEC: 40.0000 mg | DELAYED_RELEASE_TABLET | Freq: Every day | ORAL | 0 refills | Status: AC

## 2020-04-22 MED ORDER — OLMESARTAN MEDOXOMIL 40 MG PO TABS: 40.0000 mg | ORAL_TABLET | Freq: Every day | ORAL | 0 refills | Status: DC

## 2020-04-22 MED ORDER — ACETAMINOPHEN 325 MG PO TABS: 325.0000 mg | ORAL_TABLET | ORAL | Status: AC | PRN

## 2020-04-22 MED ORDER — DONEPEZIL HCL 10 MG PO TABS: 10.0000 mg | ORAL_TABLET | Freq: Every day | ORAL | 3 refills | Status: AC

## 2020-04-22 MED ORDER — METHYLPHENIDATE HCL 5 MG PO TABS: 5.0000 mg | ORAL_TABLET | Freq: Two times a day (BID) | ORAL | 0 refills | Status: AC

## 2020-04-22 MED ORDER — QUETIAPINE FUMARATE 25 MG PO TABS: 25.0000 mg | ORAL_TABLET | Freq: Every day | ORAL | 0 refills | Status: AC

## 2020-04-22 MED ORDER — IRBESARTAN 150 MG PO TABS: 150.0000 mg | ORAL_TABLET | Freq: Every day | ORAL | 0 refills | Status: DC

## 2020-04-22 MED ORDER — AMLODIPINE BESYLATE 5 MG PO TABS: 5.0000 mg | ORAL_TABLET | Freq: Every day | ORAL | 0 refills | Status: DC

## 2020-04-22 MED ORDER — MELATONIN 3 MG PO TABS: 3.0000 mg | ORAL_TABLET | Freq: Every evening | ORAL | 0 refills | Status: AC | PRN

## 2020-04-22 NOTE — Progress Notes (Signed)
Pt rested well through out the night. Denies pain and SOB. No signs of labored or rapid breathing at this time. Pt resting in bed. Call light in reach.

## 2020-04-22 NOTE — Progress Notes (Signed)
[facility] PHYSICAL MEDICINE & REHABILITATION PROGRESS NOTE   Subjective/Complaints:  Pt asleep- breakfast not in room yet- didn't want to wait to stimuli, but did say "tired" and " I'm fine".    DVV:OHYWVPX by [facility], cognition Objective:   No results found. Recent Labs    04/21/20 0704  WBC 8.0  HGB 11.8*  HCT 36.1*  PLT 324   Recent Labs    04/21/20 0704  NA 139  K 4.0  CL 105  CO2 25  GLUCOSE 89  BUN 15  CREATININE 0.85  CALCIUM 9.0    Intake/Output Summary (Last 24 hours) at 04/22/2020 0857 Last data filed at 04/21/2020 1826 Gross per 24 hour  Intake 220 ml  Output --  Net 220 ml    Physical Exam: Vital Signs Blood pressure 121/69, pulse 66, temperature 97.7 F (36.5 C), temperature source Oral, resp. rate 18, weight 63.8 kg, SpO2 95 %.   General: No acute distress- asleep- woke briefly to stimuli, NAD Mood and affect are appropriate Heart: RRR Lungs: CTA B/L- no W/R/R- good air movement Abdomen: Soft, NT, ND, (+)BS  Extremities: No clubbing, cyanosis, or edema Skin: No evidence of breakdown, no evidence of rash  Neurologic: Cranial nerves II through XII intact, motor strength is 4/5 in RIght 3- Left  deltoid, bicep, tricep, grip,3- Left and 4- right  hip flexor, knee extensors, ankle dorsiflexor and plantar flexor Sensory exam normal sensation to light touch and proprioception in bilateral upper and lower extremities Cerebellar exam normal finger to nose to finger as well as heel to shin in bilateral upper and lower extremities Musculoskeletal: Full range of motion in all 4 extremities. No joint swelling     Assessment/Plan: 1. Functional deficits secondary to Left caudate ICH  which require 3+ hours per day of interdisciplinary therapy in a comprehensive inpatient rehab setting.  Physiatrist is providing close team supervision and 24 hour management of active medical problems listed below.  Physiatrist and rehab team continue to assess barriers  to discharge/monitor patient progress toward functional and medical goals  Care Tool:  Bathing    Body parts bathed by patient: Right arm, Left arm, Chest, Abdomen, Right upper leg, Left upper leg, Face, Front perineal area, Right lower leg, Left lower leg, Buttocks   Body parts bathed by helper: Buttocks, Right lower leg, Left lower leg Body parts n/a: Front perineal area, Buttocks (Nursing completed prior to session)   Bathing assist Assist Level: Contact Guard/Touching assist     Upper Body Dressing/Undressing Upper body dressing   What is the patient wearing?: Pull over shirt    Upper body assist Assist Level: Minimal Assistance - Patient > 75%    Lower Body Dressing/Undressing Lower body dressing      What is the patient wearing?: Pants, Incontinence brief     Lower body assist Assist for lower body dressing: Moderate Assistance - Patient 50 - 74%     Toileting Toileting    Toileting assist Assist for toileting: Maximal Assistance - Patient 25 - 49%     Transfers Chair/bed transfer  Transfers assist     Chair/bed transfer assist level: Contact Guard/Touching assist Chair/bed transfer assistive device: Armrests, Walker   Locomotion Ambulation   Ambulation assist   Ambulation activity did not occur: Safety/medical concerns (Patient ambulated 2 steps in // bars)  Assist level: Supervision/Verbal cueing Assistive device: Walker-rolling Max distance: 100   Walk 10 feet activity   Assist  Walk 10 feet activity did not occur:  Safety/medical concerns (patient fatigue/weakness)  Assist level: Supervision/Verbal cueing Assistive device: Walker-rolling   Walk 50 feet activity   Assist Walk 50 feet with 2 turns activity did not occur: Safety/medical concerns (patient fatigue/weakness)  Assist level: Supervision/Verbal cueing Assistive device: Walker-rolling    Walk 150 feet activity   Assist Walk 150 feet activity did not occur: Safety/medical  concerns (patient fatigue/weakness)  Assist level: Supervision/Verbal cueing Assistive device: Walker-rolling    Walk 10 feet on uneven surface  activity   Assist Walk 10 feet on uneven surfaces activity did not occur: Safety/medical concerns (patient fatigue/weakness)         Wheelchair     Assist Will patient use wheelchair at discharge?: Yes Type of Wheelchair: Manual    Wheelchair assist level: Total Assistance - Patient < 25% Max wheelchair distance: 150    Wheelchair 50 feet with 2 turns activity    Assist        Assist Level: Total Assistance - Patient < 25%   Wheelchair 150 feet activity     Assist      Assist Level: Total Assistance - Patient < 25%   Blood pressure 121/69, pulse 66, temperature 97.7 F (36.5 C), temperature source Oral, resp. rate 18, weight 63.8 kg, SpO2 95 %.  Medical Problem List and Plan: 1.  Impaired mobility and ADLs secondary to L caudate ICH- prior RIght MCA infarct              -patient may shower             -ELOS/Goals: 10/13 MinA but may be lower at times based on cognition and arousal  10/12- con't telesitter  -Continue CIR PT, OT 2.  Antithrombotics: -DVT/anticoagulation:  Pharmaceutical: Heparin             -antiplatelet therapy: N/A 3. Pain Management: N/A 4. Mood: LCSW to follow for evaluation and support             -antipsychotic agents: N/AA 5. Neuropsych: This patient is not capable of making decisions on his own behalf. UA negative 6. Skin/Wound Care:  Routine pressure relief measures. 7. Fluids/Electrolytes/Nutrition: Monitor I/O.  8. H/o CVA with spastic L- HP: No AC due to ICH/IVH. Statin on hold due to bleed.  9.  HTN: Monitor blood pressures 3 times a day.  SBP goal <160. Continue amlodipine twice daily and Avapro daily. BP controlled.  Vitals:   04/21/20 2155 04/22/20 0505  BP:  121/69  Pulse:  66  Resp: (!) 25 18  Temp:  97.7 F (36.5 C)  SpO2:  95%   10/12- BP controlled- con't  regimen 10.  Schatzki's ring/Dysphagia: Continue dysphagia 1 with thin liquids.  Offer supplements between meals.  10/4- eating well D1 diet  10/12- upgraded to D3 thin diet- doing well 11. Vascular dementia: On Aricept and Seroquel at bedtime. Communication limited by aphasia and severe hearing impairment     reduced arousal trial methylphenidate 5mg  BID - cont current dose  12.  Prediabetes: Hgb A1C- 5.7. Monitor fasting BS with intermittent labs. Relatively well controlled.  13.  A fib: Rate controlled 10/11. Will monitor HR tid--off ASA due to recurrent ICH.  10/9- sounded RRR/rate controlled this AM    LOS: 14 days A FACE TO FACE EVALUATION WAS PERFORMED  Shadman Tozzi 04/22/2020, 8:57 AM

## 2020-04-22 NOTE — Progress Notes (Signed)
Patient ID: Jack [name], male   DOB: [birth year], 86 y.o.   MRN: [id number]  Patient set up with [facility] for Pt and OT follow up.  [place], [place]

## 2020-04-22 NOTE — Progress Notes (Signed)
Speech Language Pathology Discharge Summary  Patient Details  Name: Jack [name] MRN: [id number] Date of Birth: June 29, 1934  Today's Date: 04/22/2020 SLP Individual Time: 1300-1326 SLP Individual Time Calculation (min): 26 min   Skilled Therapeutic Interventions:  Pt was seen for skilled ST targeting cognitive goals. Pt independently oriented to self, place, and situation, overall Min A verbal and visual cues provided for use of calendar and logic to orient to date. In functional conversation targeting home safety awareness and fall prevention, pt identified safety concerns/problems and potential solutions with Supervision A verbal cues for verbal problem solving. He used Social worker and recall 2 events of previous therapy sessions with Mod A verbal cues. Pt left sitting in wheelchair with seatbelt alarm set and needs within reach. Continue per current plan of care.     Patient has met 8 of 8 long term goals.  Patient to discharge at overall Supervision;Mod;Min level.  Reasons goals not met: n/a   Clinical Impression/Discharge Summary:   Pt made functional gains and met 8 out of 8 long term goals this admission. Pt currently requires Min-Mod assist for basic familiar tasks due to cognitive impairments impacting his problem solving, attention, and most severely his short term memory. Pt also has significant hearing loss which impacts his comprehension of basic auditory information at times. He will require 24/7 supervision at discharge. Pt is consuming a Dysphagia 3 (mechanical soft) diet with thin liquids and is using safe swallow precautions with Supervision A verbal cues. Although oral holding and residue noted on admission, he has not exhibited these s/sx oral dysphagia for at least a week. Pt has demonstrated improved recall with use of external aids and strategies such as memory notebook, as well as ability to follow 1-step directions, problem solve familiar basic situations, attend to tasks  with very few cues, and use aids to assist with orientation. However, given cognitive deficits still present and with a functional impact on ADLs, recommend pt continue to receive skilled ST services upon discharge. Pt and family education has been ongoing and is complete at this time.   Care Partner:  Caregiver Able to Provide Assistance: Yes  Type of Caregiver Assistance: Cognitive  Recommendation:  24 hour supervision/assistance;Home Health SLP  Rationale for SLP Follow Up: Maximize cognitive function and independence;Reduce caregiver burden   Equipment: none   Reasons for discharge: Discharged from hospital   Patient/Family Agrees with Progress Made and Goals Achieved: Yes    Arbutus Leas 04/22/2020, 3:06 PM

## 2020-04-22 NOTE — Progress Notes (Signed)
Occupational Therapy Discharge Summary  Patient Details  Name: Jack [name] MRN: [id number] Date of Birth: Dec 25, 1933  Today's Date: 04/22/2020 OT Individual Time: 1005-1102 OT Individual Time Calculation (min): 57 min   Session Note:  Pt's spouse present with pt in the room.  Began with education on setting up the 3:1 at home and demonstrated this for use over the toilet.  Then educated pt's spouse on procedure for preparing the wheelchair for transport in her vehicle by removing the footrests, and the brake extenders as well as the cushion and folding the wheelchair up.  She was able to return demonstrate this and picked the wheelchair of for simulated placement in her car.  Next, therapist took pt down to the therapy gym where remainder of session focused on LUE functional use, sit to stand transitions, and standing balance.  Had pt work on picking up small bean bags while standing and attempting to toss them onto a [employer].  He alternated using his right as well as the left hands.  Min assist was needed for balance with reaching overall.  He did need to sit down on one occasion secondary to LOB posteriorly as well as fatigue when attempting to pick up a bean bag from lower surface.  Mod assist was needed for short distance mobility to pick up the bean bags on one occasion without an assistive device.  He continues to demonstrate increased trunk and cervical flexion in standing and with mobility resulting in the need for max demonstrational cueing to sit up tall.  Finished session with return to the room and pt left sitting up in the wheelchair with the call button and phone in reach.    Patient has met 6 of 9 long term goals due to improved activity tolerance, improved balance, ability to compensate for deficits, improved attention and improved awareness.  Patient to discharge at [facility] Assist level.  Patient's care partner is independent to provide the necessary physical and cognitive  assistance at discharge.    Reasons goals not met: He continues to need supervision for grooming and eating tasks as well as min assist for toileting tasks  Recommendation:  Patient will benefit from ongoing skilled OT services in home health setting to continue to advance functional skills in the area of BADL and Reduce care partner burden.  Jack [name] continues to demonstrate decreased balance and completion of selfcare tasks sit to stand.  He also exhibits decreased memory and awareness requiring the need for 24 hour supervision/assist from his spouse.  Feel he will continue to benefit from [facility] to further address ADL function and safety in order to help increase independence and reduce burden of care.    Equipment: 3:1  Reasons for discharge: treatment goals met and discharge from hospital  Patient/family agrees with progress made and goals achieved: Yes  OT Discharge Precautions/Restrictions  Precautions Precautions: Fall Restrictions Weight Bearing Restrictions: No  Pain Pain Assessment Pain Scale: 0-10 Pain Score: 0-No pain ADL ADL Eating: Supervision/safety Where Assessed-Eating: Wheelchair Grooming: Supervision/safety Where Assessed-Grooming: Wheelchair Upper Body Bathing: Supervision/safety Where Assessed-Upper Body Bathing: Shower Lower Body Bathing: Contact guard Where Assessed-Lower Body Bathing: Shower Upper Body Dressing: Moderate assistance Where Assessed-Upper Body Dressing: Wheelchair Lower Body Dressing: Moderate assistance Where Assessed-Lower Body Dressing: Wheelchair Toileting: Minimal assistance Where Assessed-Toileting: Bedside Commode Toilet Transfer: Therapist, music Method: Counselling psychologist: Radiographer, therapeutic: Not assessed Social research officer, government: Curator Method: Heritage manager:  Transfer tub bench Vision Baseline Vision/History: Wears  glasses Wears Glasses: Reading only Patient Visual Report: No change from baseline Vision Assessment?: Yes Eye Alignment: Within Functional Limits Ocular Range of Motion: Within Functional Limits Alignment/Gaze Preference: Within Defined Limits Tracking/Visual Pursuits: Decreased smoothness of horizontal tracking;Decreased smoothness of vertical tracking Saccades: Within functional limits Convergence: Within functional limits Visual Fields: No apparent deficits Perception  Perception: Within Functional Limits Praxis Praxis: Impaired Praxis Impairment Details: Initiation;Motor planning Cognition Overall Cognitive Status: Impaired/Different from baseline Arousal/Alertness: Awake/alert Orientation Level: Disoriented to time Attention: Sustained Focused Attention: Appears intact Sustained Attention: Impaired Sustained Attention Impairment: Verbal basic;Functional basic Memory: Impaired Memory Impairment: Retrieval deficit;Decreased short term memory;Decreased recall of new information;Decreased long term memory Decreased Short Term Memory: Verbal basic;Functional basic Awareness: Impaired Awareness Impairment: Intellectual impairment Problem Solving: Impaired Problem Solving Impairment: Functional basic;Verbal basic Initiating: Impaired Initiating Impairment: Verbal basic;Functional basic Safety/Judgment: Impaired Sensation Sensation Light Touch: Appears Intact Proprioception: Appears Intact Stereognosis: Appears Intact Additional Comments: Sensation appears intact in BUEs with gross testing. 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 Motor  Motor Motor: Hemiplegia Motor - Discharge Observations: Hx of left hemiparesis from previous CVA Mobility  Bed  Mobility Bed Mobility: Rolling Right;Rolling Left;Supine to Sit;Sit to Supine Rolling Right: Independent with assistive device Rolling Left: Independent with assistive device Supine to Sit: Contact Guard/Touching assist Sitting - Scoot to Edge of Bed: Supervision/Verbal cueing Sit to Supine: Supervision/Verbal cueing Transfers Sit to Stand: Contact Guard/Touching assist Stand to Sit: Supervision/Verbal cueing  Trunk/Postural Assessment  Cervical Assessment Cervical Assessment: Exceptions to [facility] (cervical flexion, protraction, and head tilt to the right) Thoracic Assessment Thoracic Assessment: Exceptions to [facility] (thoracic kyphosis) Lumbar Assessment Lumbar Assessment: Exceptions to [facility] (lumbar flexion with posterior pelvic tilt in sitting) Postural Control Protective Responses: delayed and inadequate  Balance Balance Balance Assessed: Yes Static Sitting Balance Static Sitting - Balance Support: Feet supported Static Sitting - Level of Assistance: 6: Modified independent (Device/Increase time) Dynamic Sitting Balance Dynamic Sitting - Balance Support: During functional activity Dynamic Sitting - Level of Assistance: 5: Stand by assistance Static Standing Balance Static Standing - Balance Support: During functional activity Static Standing - Level of Assistance: 5: Stand by assistance Dynamic Standing Balance Dynamic Standing - Balance Support: During functional activity Dynamic Standing - Level of Assistance: 4: Min assist Extremity/Trunk Assessment RUE Assessment RUE Assessment: Within Functional Limits Active Range of Motion (AROM) Comments: AROM WFLs overall General Strength Comments: strength 4/5 throughout LUE Assessment LUE Assessment: Exceptions to [facility] General Strength Comments: history of mild hemiparesis from previous CVA.  Can use functionally at a non-dominant level during selfcare tasks.  Increased ataxic movement noted with functional reach.   Shakita Keir  OTR/L 04/22/2020, 4:41 PM

## 2020-04-22 NOTE — Progress Notes (Signed)
Physical Therapy Session Note  Patient Details  Name: Jack [name] MRN: [id number] Date of Birth: 10-16-1933  Today's Date: 04/22/2020 PT Individual Time: 0800-0925 PT Individual Time Calculation (min): 85 min   Short Term Goals: Week 1:  PT Short Term Goal 1 (Week 1): Patient will maintain dynamic sitting balance >5 mins with no more than CGA PT Short Term Goal 1 - Progress (Week 1): Met PT Short Term Goal 2 (Week 1): Patient will complete STS with LRAD and no more than MinA PT Short Term Goal 2 - Progress (Week 1): Met PT Short Term Goal 3 (Week 1): Patient will transfer bed <> wc with LRAD and no more than ModA consistently PT Short Term Goal 3 - Progress (Week 1): Met PT Short Term Goal 4 (Week 1): Patient will ambulate >49f with LRAD and no more than ModA PT Short Term Goal 4 - Progress (Week 1): Met Week 2:  PT Short Term Goal 1 (Week 2): Pt will perform supine<>sit with CGA PT Short Term Goal 2 (Week 2): Pt will perform sit<>stands using LRAD with CGA PT Short Term Goal 3 (Week 2): Pt will perform stand pivot transfers using LRAD with CGA PT Short Term Goal 4 (Week 2): Pt will ambulate at least 720fusing LRAD with CGA PT Short Term Goal 5 (Week 2): Pt will ascend/descend 4 steps using 1 HR with CGA   Skilled Therapeutic Interventions/Progress Updates:    PAIN  Denies pain this am  Pt initially supine, awake, agreeable to session.  Pt in soiled brief.  Rolled L/R mult times using rail for removal of soiled brief.  Provided w/washcloth an towel and pt able to clean perineal area w/set up.  Rolls L/r mult time using rail for placement of clean brief by therapist.    Pt lifts feet for therapist to thread pants and raise to thighs.  Supine to side to sit w/cues for sequencing.  Attempted to perform mult times without assist, but ultimately required min assist to achieve sitting.  In sitting, pt removes nightshirt and dons clean shirt requiring min assist and cues for ant wt shift  for sitting balance due to post tendency.  Wife arrived at this time and agreed to participate in family training.  Discussed sitting balance deficits/safety. STS w/cga, SPT to wc w/cga, cues for safety.  Pt transported to gym for continued family training.    Gait 10013f/RW and cga plus verbal cues for widening base of support and upright posture/gaze.  Stair training: Discussed home set up w/wife and wife described 6 steps to landing + six steps to upper level of home w/railing on L only.  Pt performed ascending 4steps w/rail on L and cues for sequencing, CGA, wife observed and verbally instructed. Pt then proceeded w/additional 4 steps w/wife guarding patient and verbal cues given by PT for safe positioning/guarding. Pt rested in sitting and wife practiced w/therapist acting as patient w/improved technique demonstrated by wife w/repeated efforts.    Functional gait x  13f45f around cones w/RW w/cga and cues, progressive increase in shuffling, decreased cadence, narrowing base of support, pt required seated rest at 13ft60f to fatigue/declining gait function.  Pt transported to rehab apartment.  SPT wc to bed w/cues and cga.  Performs sit to supine w/mod assist for LEs required.  Demonstrated body mechanics w/assisting patient and discussed need for bedrail to decreased caregiver burden.  Wife stated they "may still have" bedrails and she will purchase if unable  to locate.  Demonstrated rolling and then min assist required for side to sit w/pt.  SPT to wc w/RW and cga. Wife then practiced mechanics/cues using therapist acting as pt (due to pt fatigue) and demonstrated good technique.  Pt then transported back to room at end of session.  Pt left oob in wc w/alarm belt set and needs in reach.  Wife at bedside.   Therapy Documentation Precautions:  Precautions Precautions: Fall Precaution Comments: monitor SpO2 Restrictions Weight Bearing Restrictions: No    Therapy/Group:  Individual Therapy  Callie Fielding, PT   Jerrilyn Cairo 04/22/2020, 12:40 PM

## 2020-04-22 NOTE — Progress Notes (Signed)
Physical Therapy Session Note  Patient Details  Name: Jack [name] MRN: [id number] Date of Birth: 03-25-1934  Today's Date: 04/22/2020 PT Individual Time: 1410-1456 PT Individual Time Calculation (min): 46 min   Short Term Goals: Week 2:  PT Short Term Goal 1 (Week 2): Pt will perform supine<>sit with CGA PT Short Term Goal 2 (Week 2): Pt will perform sit<>stands using LRAD with CGA PT Short Term Goal 3 (Week 2): Pt will perform stand pivot transfers using LRAD with CGA PT Short Term Goal 4 (Week 2): Pt will ambulate at least 54ft using LRAD with CGA PT Short Term Goal 5 (Week 2): Pt will ascend/descend 4 steps using 1 HR with CGA  Skilled Therapeutic Interventions/Progress Updates:    Pt's wife had already left for the day so no further family training completed but she was able to be present for AM PT session.   Focused on bed mobility retraining this PM without rails in hospital bed and then again in ADL apartment to simulate home environment mobility. Pt able to perform both times supine <> sit with supervision requiring cues for technique, safety during transition, and extra time to complete tasks. Attempted to educate on log roll technique to increase efficiency but pt does not demonstrate carryover. Performed functional transfers throughout session with close supervision for safety due to decreased balance and noted posterior lean at times though did not require physical intervention. Max cues at times for RW placement and positioning for safety as pt tendency to leave it too far out in front during turning or starting to only holdonto furniture instead of RW for support. Furniture transfer to low couch in ADL apartment to simulate home environment with close supervision and verbal cues needed for technique. Functional gait in home environment with RW on carpeted surface with overall close supervision for safety due to poor RW management and high fall risk.   PT had pt transfer to w/c  delivered for home and made sure legrests adjusted to best fit.  Pt report need to urinate upon return to room when asked, and performed toileting in standing with urinal with min assist for functional dynamic standing balance without UE support for clothing management and assist with urinal. Initially had started walking to the bathroom but then wanted to stop and just use urinal. Retro gait back to w/c x 6' with min assist for negotiation and cues for positioning of RW.  Therapy Documentation Precautions:  Precautions Precautions: Fall Precaution Comments: monitor SpO2 Restrictions Weight Bearing Restrictions: No  Pain: Pain Assessment Pain Scale: 0-10 Pain Score: 0-No pain    Therapy/Group: Individual Therapy  Canary Brim Ivory Broad, PT, DPT, CBIS  04/22/2020, 3:12 PM

## 2020-04-22 NOTE — Discharge Instructions (Signed)
Inpatient Rehab Discharge Instructions  Jack [name] Discharge date and time:    Activities/Precautions/ Functional Status: Activity: no lifting, driving, or strenuous exercise till cleared by MD Diet: cardiac diet Wound Care: none needed    Functional status:  ___ No restrictions     ___ Walk up steps independently ___ 24/7 supervision/assistance   ___ Walk up steps with assistance ___ Intermittent supervision/assistance  ___ Bathe/dress independently ___ Walk with walker     ___ Bathe/dress with assistance ___ Walk Independently    ___ Shower independently ___ Walk with assistance    ___ Shower with assistance ___ No alcohol     ___ Return to work/school ________ COMMUNITY REFERRALS UPON DISCHARGE:    Home Health:   PT     OT                    Agency: [facility] Phone: [phone]    Medical Equipment/Items Ordered: Wheelchair, Bedside Commode                                                 Agency/Supplier: [employer]  Special Instructions:  STROKE/TIA DISCHARGE INSTRUCTIONS SMOKING Cigarette smoking nearly doubles your risk of having a stroke & is the single most alterable risk factor  If you smoke or have smoked in the last 12 months, you are advised to quit smoking for your health.  Most of the excess cardiovascular risk related to smoking disappears within a year of stopping.  Ask you doctor about anti-smoking medications  [place] Quit Line: 1-800-QUIT NOW  Free Smoking Cessation Classes [phone]  CHOLESTEROL Know your levels; limit fat & cholesterol in your diet  Lipid Panel     Component Value Date/Time   CHOL 158 04/02/2020 1300   TRIG 116 04/02/2020 1300   HDL 32 (L) 04/02/2020 1300   CHOLHDL 4.9 04/02/2020 1300   VLDL 23 04/02/2020 1300   LDLCALC 103 (H) 04/02/2020 1300      Many patients benefit from treatment even if their cholesterol is at goal.  Goal: Total Cholesterol (CHOL) less than 160  Goal:  Triglycerides (TRIG) less than  150  Goal:  HDL greater than 40  Goal:  LDL (LDLCALC) less than 100   BLOOD PRESSURE American Stroke Association blood pressure target is less that 120/80 mm/Hg  Your discharge blood pressure is:  BP: 103/66  Monitor your blood pressure  Limit your salt and alcohol intake  Many individuals will require more than one medication for high blood pressure  DIABETES (A1c is a blood sugar average for last 3 months) Goal HGBA1c is under 7% (HBGA1c is blood sugar average for last 3 months)  Diabetes:     Lab Results  Component Value Date   HGBA1C 5.7 (H) 04/02/2020     Your HGBA1c can be lowered with medications, healthy diet, and exercise.  Check your blood sugar as directed by your physician  Call your physician if you experience unexplained or low blood sugars.  PHYSICAL ACTIVITY/REHABILITATION Goal is 30 minutes at least 4 days per week  Activity: No driving,  Therapies: see above Return to work: N/A  Activity decreases your risk of heart attack and stroke and makes your heart stronger.  It helps control your weight and blood pressure; helps you relax and can improve your mood.  Participate  in a regular exercise program.  Talk with your doctor about the best form of exercise for you (dancing, walking, swimming, cycling).  DIET/WEIGHT Goal is to maintain a healthy weight  Your discharge diet is:  Diet Order            DIET - DYS 1 Room service appropriate? Yes with Assist; Fluid consistency: Thin  Diet effective now                 liquids Your height is:  5'1" Your current weight is: Weight: 62.7 kg Your Body Mass Index (BMI) is:    Following the type of diet specifically designed for you will help prevent another stroke.  You are at goal weight    Your goal Body Mass Index (BMI) is 19-24.  Healthy food habits can help reduce 3 risk factors for stroke:  High cholesterol, hypertension, and excess weight.  RESOURCES Stroke/Support Group:  Call [phone]   STROKE  EDUCATION PROVIDED/REVIEWED AND GIVEN TO PATIENT Stroke warning signs and symptoms How to activate emergency medical system (call 911). Medications prescribed at discharge. Need for follow-up after discharge. Personal risk factors for stroke. Pneumonia vaccine given:  Flu vaccine given:  My questions have been answered, the writing is legible, and I understand these instructions.  I will adhere to these goals & educational materials that have been provided to me after my discharge from the hospital.     My questions have been answered and I understand these instructions. I will adhere to these goals and the provided educational materials after my discharge from the hospital.  Patient/Caregiver Signature _______________________________ Date __________  Clinician Signature _______________________________________ Date __________  Please bring this form and your medication list with you to all your follow-up doctor's appointments.

## 2020-04-23 NOTE — Progress Notes (Signed)
[place] PHYSICAL MEDICINE & REHABILITATION PROGRESS NOTE   Subjective/Complaints:  Wife at bedside no new issues    ZDG:LOVFIEP by [facility], cognition Objective:   No results found. Recent Labs    04/21/20 0704  WBC 8.0  HGB 11.8*  HCT 36.1*  PLT 324   Recent Labs    04/21/20 0704  NA 139  K 4.0  CL 105  CO2 25  GLUCOSE 89  BUN 15  CREATININE 0.85  CALCIUM 9.0    Intake/Output Summary (Last 24 hours) at 04/23/2020 3295 Last data filed at 04/22/2020 1700 Gross per 24 hour  Intake 370 ml  Output --  Net 370 ml    Physical Exam: Vital Signs Blood pressure 138/74, pulse 75, temperature 98.4 F (36.9 C), resp. rate 18, weight 63.6 kg, SpO2 94 %.  General: No acute distress Mood and affect are appropriate Heart: Regular rate and rhythm no rubs murmurs or extra sounds Lungs: Clear to auscultation, breathing unlabored, no rales or wheezes Abdomen: Positive bowel sounds, soft nontender to palpation, nondistended Extremities: No clubbing, cyanosis, or edema Skin: No evidence of breakdown, no evidence of rash  Neurologic:, motor strength is 4/5 in RIght 3- Left  deltoid, bicep, tricep, grip,3- Left and 4- right  hip flexor, knee extensors, ankle dorsiflexor and plantar flexor Sensory exam normal sensation to light touch and proprioception in bilateral upper and lower extremities Cerebellar exam normal finger to nose to finger as well as heel to shin in bilateral upper and lower extremities Musculoskeletal: Full range of motion in all 4 extremities. No joint swelling     Assessment/Plan: 1. Functional deficits secondary to Left caudate ICH  Stable for D/C today F/u PCP in 3-4 weeks F/u PM&R 2 weeks See D/C summary See D/C instructionsCare Tool:  Bathing    Body parts bathed by patient: Right arm, Left arm, Chest, Abdomen, Right upper leg, Left upper leg, Face, Front perineal area, Right lower leg, Left lower leg, Buttocks   Body parts bathed by helper:  Buttocks, Right lower leg, Left lower leg Body parts n/a: Front perineal area, Buttocks (Nursing completed prior to session)   Bathing assist Assist Level: Contact Guard/Touching assist     Upper Body Dressing/Undressing Upper body dressing   What is the patient wearing?: Pull over shirt, Button up shirt    Upper body assist Assist Level: Moderate Assistance - Patient 50 - 74%    Lower Body Dressing/Undressing Lower body dressing      What is the patient wearing?: Pants, Incontinence brief     Lower body assist Assist for lower body dressing: Moderate Assistance - Patient 50 - 74%     Toileting Toileting    Toileting assist Assist for toileting: Minimal Assistance - Patient > 75%     Transfers Chair/bed transfer  Transfers assist     Chair/bed transfer assist level: Supervision/Verbal cueing Chair/bed transfer assistive device: Armrests, Programmer, multimedia   Ambulation assist   Ambulation activity did not occur: Safety/medical concerns (Patient ambulated 2 steps in // bars)  Assist level: Contact Guard/Touching assist Assistive device: Walker-rolling Max distance: 10'   Walk 10 feet activity   Assist  Walk 10 feet activity did not occur: Safety/medical concerns (patient fatigue/weakness)  Assist level: Supervision/Verbal cueing Assistive device: Walker-rolling   Walk 50 feet activity   Assist Walk 50 feet with 2 turns activity did not occur: Safety/medical concerns (patient fatigue/weakness)  Assist level: Supervision/Verbal cueing Assistive device: Walker-rolling    Walk  150 feet activity   Assist Walk 150 feet activity did not occur: Safety/medical concerns (patient fatigue/weakness)  Assist level: Supervision/Verbal cueing Assistive device: Walker-rolling    Walk 10 feet on uneven surface  activity   Assist Walk 10 feet on uneven surfaces activity did not occur: Safety/medical concerns (patient  fatigue/weakness)   Assist level: Minimal Assistance - Patient > 75% Assistive device: Aeronautical engineer Will patient use wheelchair at discharge?: Yes Type of Wheelchair: Manual    Wheelchair assist level: Total Assistance - Patient < 25% Max wheelchair distance: 150    Wheelchair 50 feet with 2 turns activity    Assist        Assist Level: Total Assistance - Patient < 25%   Wheelchair 150 feet activity     Assist      Assist Level: Total Assistance - Patient < 25%   Blood pressure 138/74, pulse 75, temperature 98.4 F (36.9 C), resp. rate 18, weight 63.6 kg, SpO2 94 %.  Medical Problem List and Plan: 1.  Impaired mobility and ADLs secondary to L caudate ICH- prior RIght MCA infarct             DC home today  2.  Antithrombotics: -DVT/anticoagulation:  Pharmaceutical: Heparin             -antiplatelet therapy: N/A 3. Pain Management: N/A 4. Mood: LCSW to follow for evaluation and support             -antipsychotic agents: N/AA 5. Neuropsych: This patient is not capable of making decisions on his own behalf. UA negative 6. Skin/Wound Care:  Routine pressure relief measures. 7. Fluids/Electrolytes/Nutrition: Monitor I/O.  8. H/o CVA with spastic L- HP: No AC due to ICH/IVH. Statin on hold due to bleed.  9.  HTN: Monitor blood pressures 3 times a day.  SBP goal <160. Continue amlodipine twice daily and Avapro daily. BP controlled.  Vitals:   04/22/20 1923 04/23/20 0516  BP: 113/66 138/74  Pulse: 89 75  Resp: 16 18  Temp: 98.1 F (36.7 C) 98.4 F (36.9 C)  SpO2: 95% 94%   10/12- BP controlled- con't regimen 10.  Schatzki's ring/Dysphagia: Continue dysphagia 1 with thin liquids.  Offer supplements between meals.  10/4- eating well D1 diet  10/12- upgraded to D3 thin diet- doing well 11. Vascular dementia: On Aricept and Seroquel at bedtime. Communication limited by aphasia and severe hearing impairment     reduced arousal  trial methylphenidate 5mg  BID - may cont for 1-2 mo post d/c 12.  Prediabetes: Hgb A1C- 5.7. Monitor fasting BS with intermittent labs. Relatively well controlled.  13.  A fib: Rate controlled 10/11. Will monitor HR tid--off ASA due to recurrent ICH.  10/9- sounded RRR/rate controlled this AM    LOS: 15 days A FACE TO Ruth E Seleena Reimers 04/23/2020, 8:08 AM

## 2020-04-23 NOTE — Progress Notes (Signed)
Inpatient Rehabilitation Care Coordinator  Discharge Note  The overall goal for the admission was met for:   Discharge location: Yes, Home  Length of Stay: Yes, 15 Days   Discharge activity level: Yes, Supervision  Home/community participation: Yes  Services provided included: MD, RD, PT, OT, SLP, RN, CM, TR, Pharmacy, [place]: Private Insurance: [employer]  Follow-up services arranged: Home Health: [facility]  Comments (or additional information): PT and OT   Patient/Family verbalized understanding of follow-up arrangements: Yes  Individual responsible for coordination of the follow-up plan: [name] (Spouse) [phone]  Confirmed correct DME delivered: [name] 04/23/2020    [name]

## 2020-04-25 ENCOUNTER — Telehealth: Payer: Self-pay | Admitting: Nurse Practitioner

## 2020-04-25 ENCOUNTER — Telehealth: Payer: Self-pay | Admitting: Registered Nurse

## 2020-04-25 DIAGNOSIS — F0390 Unspecified dementia without behavioral disturbance: Secondary | ICD-10-CM

## 2020-04-25 NOTE — Telephone Encounter (Signed)
Transitional Care call Transitional Questions answered by Mrs. [place]  Patient name: Jack [name]  DOB: [birth year] 1. Are you/is patient experiencing any problems since coming home? No a. Are there any questions regarding any aspect of care? No 2. Are there any questions regarding medications administration/dosing? Yes, Senokot and Melatonin, Ms. Quast will call pharmacy and pick up these medications today, she verbalizes understanding. Instructed to call if she needs any help, she verbalizes understanding.  a. Are meds being taken as prescribed? Yes b. "Patient should review meds with caller to confirm" Medication List Reviewed.  3. Have there been any falls? No 4. Has Home Health been to the house and/or have they contacted you? Yes: [facility] a. If not, have you tried to contact them? NA b. Can we help you contact them? NA 5. Are bowels and bladder emptying properly? Yes a. Are there any unexpected incontinence issues? No b. If applicable, is patient following bowel/bladder programs? NA 6. Any fevers, problems with breathing, unexpected pain? No 7. Are there any skin problems or new areas of breakdown? No 8. Has the patient/family member arranged specialty MD follow up (ie cardiology/neurology/renal/surgical/etc.)?  Ms. Keziah was instructed to call [facility] and Charlene Brooke [facility]che to schedule HFU appointments, she verbalizes understanding.  a. Can we help arrange? No 9. Does the patient need any other services or support that we can help arrange? No 10. Are caregivers following through as expected in assisting the patient? Yes 11. Has the patient quit smoking, drinking alcohol, or using drugs as recommended? (                        )  Appointment date/time 05/02/2020  arrival time 11:00 for 11:20 appointment with Bayard Hugger ANP-C. At [place]

## 2020-04-25 NOTE — Telephone Encounter (Signed)
Cindee Salt is calling and requesting verbal orders for PT for 1 week 1, 2 week 3 and 1 week 3, please advise. CB is [phone]

## 2020-04-25 NOTE — Discharge Summary (Signed)
Physician Discharge Summary  Patient ID: Jack [name] MRN: [id number] DOB/AGE: Apr 15, 1934 86 y.o.  Admit date: 04/08/2020 Discharge date: 04/23/2020  Discharge Diagnoses:  Principal Problem:   ICH (intracerebral hemorrhage) ([place]) - L caudate head w/ IVH and SAH Active Problems:   History of atrial fibrillation   Essential hypertension   HOH (hard of hearing)   Dysphagia due to recent stroke   Dementia [facility])   Discharged Condition: good  Significant Diagnostic Studies: N/A   Labs:  Basic Metabolic Panel: Recent Labs  Lab 04/21/20 0704  NA 139  K 4.0  CL 105  CO2 25  GLUCOSE 89  BUN 15  CREATININE 0.85  CALCIUM 9.0    CBC: Recent Labs  Lab 04/21/20 0704  WBC 8.0  HGB 11.8*  HCT 36.1*  MCV 91.4  PLT 324    CBG: No results for input(s): GLUCAP in the last 168 hours.  Brief HPI:   Jack [name] is a 86 y.o. male with history of L-MCA infarct with residual left hemiparesis A. fib-no anticoagulation due to prior ICH, Schatzki's ring, vascular dementia who was admitted on 04/02/2020 with low-grade fever and worsening of confusion.  He was febrile and tachycardic at admission with positive UA and was treated with Rocephin briefly due to concerns of UTI.  CT head showed suspicion of hemorrhage in left basal ganglia and left caudate region felt to be hypertensive in nature.  He was treated with Cleviprex for BP control and ASA was discontinued.  Neurology felt that ICH/IVH was due to small vessel disease with hypertension.  Hospital course was significant for issues with fluid overload requiring IV diuresis x2.  He had defervesced and leukocytosis was resolving.  Follow-up CT head showed slight decrease in basal ganglia hemorrhage.  He continued to have issues with lethargy, balance deficits with left lateral lean as well as difficulty with ADLs.  CIR was recommended due to functional decline.   Hospital Course: Jack [name] was admitted to rehab 04/08/2020 for inpatient  therapies to consist of PT, ST and OT at least three hours five days a week. Past admission physiatrist, therapy team and rehab RN have worked together to provide customized collaborative inpatient rehab.  His heart rate has been monitored on 3 times daily basis and has been controlled.  His blood pressures were monitored on TID basis and and has been controlled on current regimen.  Statin remains on hold due to bleed.  His p.o. intake has improved and as swallow function improved he was upgraded to dysphagia 3 diet with supervision.  Follow-up labs showed renal status to be stable.  Serial CBC showed  fasting blood sugars have been relatively controlled on follow-up labs.  Low-dose Ritalin was added to help with arousal and for activation.  His progress has been limited by severe hearing impairment as well as dementia.  He has progressed to min assist level and will continue to receive home health therapy [facility] after discharge.   Rehab course: During patient's stay in rehab weekly team conferences were held to monitor patient's progress, set goals and discuss barriers to discharge. At admission, patient required max assist with mobility and total assist with ADL tasks.  He exhibited severe cognitive impairments with inability to follow one-step commands consistently and was oriented to self only.  He required dysphagia 1 diet with supervision for safe swallow strategies.He  has had improvement in activity tolerance, balance, postural control as well as ability to compensate for  deficits.  He requires min assist with ADL task.  He required contact-guard assist to supervision for mobility.He is oriented x3 and currently requires supervision to mod assist depending on cognitive tasks.  Family education was completed with wife.  Discharge disposition: 01-Home or Self Care.  Diet: Dysphagia 3  Special Instructions: 1. Needs supervision with meals/activity.  2. Repeat CBC in 1-2 weeks two monitor  H/H.   Discharge Instructions    Ambulatory referral to Physical Medicine Rehab   Complete by: As directed    Moderate complexity follow-up 1 to 2 weeks left caudate ICH     Allergies as of 04/23/2020      Reactions   Sulfa Antibiotics Swelling   Swelling in groin area      Medication List    STOP taking these medications   heparin 5000 UNIT/ML injection   ipratropium 0.02 % nebulizer solution Commonly known as: ATROVENT   irbesartan 150 MG tablet Commonly known as: AVAPRO   sodium chloride flush 0.9 % Soln Commonly known as: NS     TAKE these medications   acetaminophen 325 MG tablet Commonly known as: TYLENOL Take 1-2 tablets (325-650 mg total) by mouth every 4 (four) hours as needed for mild pain.   amLODipine 5 MG tablet Commonly known as: NORVASC Take 1 tablet (5 mg total) by mouth daily. What changed: when to take this   donepezil 10 MG tablet Commonly known as: Aricept Take 1 tablet (10 mg total) by mouth at bedtime.   melatonin 3 MG Tabs tablet Take 1 tablet (3 mg total) by mouth at bedtime as needed (insomnia).   methylphenidate 5 MG tablet--Rx# 60 pills Commonly known as: RITALIN Take 1 tablet (5 mg total) by mouth 2 (two) times daily with breakfast and lunch.   olmesartan 40 MG tablet Commonly known as: BENICAR Take 1 tablet (40 mg total) by mouth daily. No additional refills without and office visit.   pantoprazole 40 MG tablet Commonly known as: PROTONIX Take 1 tablet (40 mg total) by mouth daily.   QUEtiapine 25 MG tablet Commonly known as: SEROQUEL Take 1 tablet (25 mg total) by mouth at bedtime.   senna-docusate 8.6-50 MG tablet Commonly known as: Senokot-S Take 1 tablet by mouth 2 (two) times daily.   tamsulosin 0.4 MG Caps capsule Commonly known as: FLOMAX Take 1 capsule (0.4 mg total) by mouth daily after breakfast.       Follow-up Information    Kirsteins, Luanna Salk, MD Follow up.   Specialty: Physical Medicine and  Rehabilitation Why: Office will call you with follow up appointment Contact information: [place] [address] [phone]        [place]. Call.   Why: for stroke follow up Contact information: [address]     [place] [id number] Elliott, Charlene Brooke, NP. Call.   Specialty: Internal Medicine Why: for post hospital follow up Contact information: [address] [place] [address] [phone]               Signed: Bary Leriche 04/25/2020, 12:50 AM

## 2020-04-28 NOTE — Telephone Encounter (Signed)
Verbal orders given on [employer].

## 2020-05-02 ENCOUNTER — Other Ambulatory Visit: Payer: Self-pay

## 2020-05-02 ENCOUNTER — Encounter: Payer: Medicare Other | Attending: Registered Nurse | Admitting: Registered Nurse

## 2020-05-02 ENCOUNTER — Encounter: Payer: Self-pay | Admitting: Registered Nurse

## 2020-05-02 VITALS — BP 130/80 | HR 95 | Temp 98.1°F | Ht 61.0 in | Wt 145.0 lb

## 2020-05-02 DIAGNOSIS — I10 Essential (primary) hypertension: Secondary | ICD-10-CM

## 2020-05-02 DIAGNOSIS — I610 Nontraumatic intracerebral hemorrhage in hemisphere, subcortical: Secondary | ICD-10-CM

## 2020-05-02 DIAGNOSIS — I69391 Dysphagia following cerebral infarction: Secondary | ICD-10-CM | POA: Diagnosis present

## 2020-05-02 DIAGNOSIS — I6930 Unspecified sequelae of cerebral infarction: Secondary | ICD-10-CM | POA: Diagnosis not present

## 2020-05-02 NOTE — Patient Instructions (Signed)
Call Office on November the 8th if you haven't seen Dr Letta Pate: Regarding the Ritalin:

## 2020-05-02 NOTE — Progress Notes (Signed)
Subjective:    Patient ID: Jack [name], male    DOB: 1934/01/05, 86 y.o.   MRN: [id number]  HPI: Jack [name] is a 86 y.o. male who is here for Transitional Care Visit for follow up of his ICH-L caudate head w/IVAH and SAH, Dysphagia due to recent stroke and  Essential Hypertension.He presented to [facility] on 04/02/2020 with low-grade fever and worsening confusion. Neurology was consulted.  CT Head WO Contrast:  IMPRESSION: Intraventricular hemorrhage is noted bilaterally, but most prominently seen in the left lateral ventricle, and potentially may be originating from the left thalamus. No mass effect or midline shift is noted. Moderate chronic ischemic white matter disease is noted. Mild diffuse cortical atrophy is noted  CT Head:  IMPRESSION: Atrial ventricular hemorrhage is stable. No hydrocephalus. Suspect hemorrhage origin from the left basal ganglia/ left caudate region. Probable hypertensive hemorrhage  Extensive chronic microvascular ischemic changes in the white matter.  CT Head: 04/04/2020:  IMPRESSION: 1. Slight interval decrease in size of left basal ganglia hemorrhage, now measuring 2.1 x 1.7 x 1.7 cm. Intraventricular extension with blood layering in both lateral ventricles, slightly increased. Overall ventricular size is stable without hydrocephalus or trapping. 2. Trace subarachnoid blood at the posterior left temporoparietal region, suspected to be related to redistribution. No new hemorrhage. 3. No other new acute intracranial abnormality.  Jack [name] was admitted to Inpatient Rehabilitation on 04/08/2020 and discharged home on 04/23/2020. He is receiving [facility] Health Therapy from [facility]. He denies any pain. He rates his pain 0. Also reports he has a good appetite.   Mrs. Nehring in room.   Pain Inventory Average Pain 0 Pain Right Now 0 My pain is no pain  In the last 24 hours, has pain interfered with the following? General activity  0 Relation with others 0 Enjoyment of life 0 What TIME of day is your pain at its worst? varies Sleep (in general) Fair  Pain is worse with: no pain Pain improves with: no pain Relief from Meds: no pain  walk with assistance use a walker use a wheelchair needs help with transfers  retired I need assistance with the following:  dressing, bathing, toileting, meal prep, household duties and shopping  bladder control problems bowel control problems weakness trouble walking  Any changes since last visit?  no  Any changes since last visit?  no  Has not seen any other docotors yet    Family History  Family history unknown: Yes   Social History   Socioeconomic History  . Marital status: Married    Spouse name: [name]  . Number of children: Not on file  . Years of education: Not on file  . Highest education level: Not on file  Occupational History  . Not on file  Tobacco Use  . Smoking status: Never Smoker  . Smokeless tobacco: Never Used  Substance and Sexual Activity  . Alcohol use: No  . Drug use: No  . Sexual activity: Not on file  Other Topics Concern  . Not on file  Social History Narrative   Married, exercises with stationary bike 5 days per week.   Eats healthy.  As of 03/2015   Social Determinants of Health   Financial Resource Strain:   . Difficulty of Paying Living Expenses: Not on file  Food Insecurity:   . Worried About Charity fundraiser in the Last Year: Not on file  . Ran Out of Food in the Last Year: Not  on file  Transportation Needs:   . Lack of Transportation (Medical): Not on file  . Lack of Transportation (Non-Medical): Not on file  Physical Activity:   . Days of Exercise per Week: Not on file  . Minutes of Exercise per Session: Not on file  Stress:   . Feeling of Stress : Not on file  Social Connections:   . Frequency of Communication with Friends and Family: Not on file  . Frequency of Social Gatherings with Friends and Family: Not  on file  . Attends Religious Services: Not on file  . Active Member of Clubs or Organizations: Not on file  . Attends Archivist Meetings: Not on file  . Marital Status: Not on file   Past Surgical History:  Procedure Laterality Date  . CARDIAC CATHETERIZATION  02/2010   LVEF 70%, severe mitral regurgitation, pulm HTN; prior in 01/2006  . INGUINAL HERNIA REPAIR     bilat  . MITRAL VALVE REPAIR  01/2006   mitral repair with Maze procedure, tricuspid annuloplasty, MAZE and LAA oversewn by Dr. Roxy Manns   Past Medical History:  Diagnosis Date  . Acute CVA (cerebrovascular accident) ([place]) 07/05/2016  . Atrial fibrillation ([place])    Dr. Tollie Eth  . BPH (benign prostatic hypertrophy) 2012   prior therapy with finasteride, but as of 03/2015 no c/o; Dr. Luanne Bras  . Cancer ([place])   . Cellulitis of face 11/08   [facility]  . CVA (cerebral infarction) 2009, 2015  . Echocardiogram abnormal 02/11/2010   LVEF 70%, severe mitral regurgitation, pulm HTN   . ED (erectile dysfunction)    Viagra prn, consult with Urology 2012  . Elevated PSA 2012   BPH; Dr. Luanne Bras, [facility]; no more PSAs needed as of 2012 consult  . Gait abnormality    uses cane, s/p CVA  . Hemiparesis affecting left side as late effect of cerebrovascular accident ([place])   . Hiatal hernia    per 2008 EUS  . Hyperlipidemia   . Hypertension   . Microscopic hematuria    chronic, eval 2012 with Urology, benign at that time  . Obstructive uropathy 2012   Dr. Luanne Bras  . Pancreatic mass 01/2006   inconsequential per CT 2007  . 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 Patent foramen ovale    hx/o, notation of fistula on echocardiogram from 2009  . Prophylactic antibiotic 2016   requires due to valve disease  . Schatzki's ring 2008   per endoscopic ultrasound; Dr. Janetta Hora. [name]  . Stroke ([place])   . Ventral hernia    surgery consult  01/2010 with Dr. Excell Seltzer, elective repair if desired , but he declined  . Wears hearing aid    BP 130/80   Pulse 95   Temp 98.1 F (36.7 C)   Ht 5\' 1"  (1.549 m)   Wt 145 lb (65.8 kg) Comment: reported  SpO2 94%   BMI 27.40 kg/m   Opioid Risk Score:   Fall Risk Score:  `1  Depression screen PHQ 2/9  Depression screen [facility] 2/9 05/02/2020 01/26/2019 12/24/2016 09/15/2016 07/27/2016 07/20/2016 02/24/2015  Decreased Interest 0 0 0 0 0 0 0  Down, Depressed, Hopeless 0 0 0 0 0 0 0  PHQ - 2 Score 0 0 0 0 0 0 0  Altered sleeping 1 - - - - 0 -  Tired, decreased energy 0 - - - - 2 -  Change  in appetite 0 - - - - 2 -  Feeling bad or failure about yourself  2 - - - - 0 -  Trouble concentrating 0 - - - - 0 -  Moving slowly or fidgety/restless 0 - - - - 0 -  Suicidal thoughts 0 - - - - 0 -  PHQ-9 Score 3 - - - - 4 -  Some recent data might be hidden    Review of Systems  Constitutional: Negative.   HENT: Negative.   Eyes: Negative.   Respiratory: Negative.   Cardiovascular: Negative.   Gastrointestinal: Positive for diarrhea.  Endocrine: Negative.   Genitourinary: Positive for difficulty urinating.  Musculoskeletal: Positive for gait problem.  Skin: Negative.   Allergic/Immunologic: Negative.   Neurological: Positive for weakness.  Hematological: Negative.   Psychiatric/Behavioral: Negative.   All other systems reviewed and are negative.      Objective:   Physical Exam Vitals and nursing note reviewed.  Constitutional:      Appearance: Normal appearance.  Cardiovascular:     Rate and Rhythm: Normal rate and regular rhythm.     Pulses: Normal pulses.     Heart sounds: Normal heart sounds.  Pulmonary:     Effort: Pulmonary effort is normal.     Breath sounds: Normal breath sounds.  Musculoskeletal:     Cervical back: Normal range of motion and neck supple.     Comments: Normal Muscle Bulk and Muscle Testing Reveals:  Upper Extremities: Full ROM and Muscle Strength  4/5  Lower Extremities: Full ROM and Muscle Strength 5/5   Arrived in wheelchair  Skin:    General: Skin is warm and dry.  Neurological:     Mental Status: He is alert and oriented to person, place, and time.  Psychiatric:        Mood and Affect: Mood normal.        Behavior: Behavior normal.           Assessment & Plan:  1. ICH-L caudate head w/IVAH and SAH: Dysphagia due to recent stroke:  [place] with [facility]. He has a scheduled appointment with Neurology.  2.Essential Hypertension: Continue current medication regimen. PCP following. Continue to monitor.   20  minutes of face to face patient care time was spent during this visit. All questions were encouraged and answered.  F/U in 4-6 weeks with Dr. Letta Pate

## 2020-05-05 ENCOUNTER — Inpatient Hospital Stay: Payer: Medicare Other | Admitting: Nurse Practitioner

## 2020-05-06 ENCOUNTER — Other Ambulatory Visit: Payer: Self-pay

## 2020-05-07 ENCOUNTER — Inpatient Hospital Stay: Payer: Medicare Other | Admitting: Nurse Practitioner

## 2020-05-12 ENCOUNTER — Ambulatory Visit (INDEPENDENT_AMBULATORY_CARE_PROVIDER_SITE_OTHER): Payer: Medicare Other | Admitting: Nurse Practitioner

## 2020-05-12 ENCOUNTER — Encounter: Payer: Self-pay | Admitting: Nurse Practitioner

## 2020-05-12 ENCOUNTER — Other Ambulatory Visit: Payer: Self-pay

## 2020-05-12 VITALS — BP 114/70 | HR 100 | Temp 97.7°F | Ht 61.0 in | Wt 146.2 lb

## 2020-05-12 DIAGNOSIS — N401 Enlarged prostate with lower urinary tract symptoms: Secondary | ICD-10-CM | POA: Diagnosis not present

## 2020-05-12 DIAGNOSIS — R972 Elevated prostate specific antigen [PSA]: Secondary | ICD-10-CM | POA: Diagnosis not present

## 2020-05-12 DIAGNOSIS — I6930 Unspecified sequelae of cerebral infarction: Secondary | ICD-10-CM | POA: Diagnosis not present

## 2020-05-12 DIAGNOSIS — R350 Frequency of micturition: Secondary | ICD-10-CM

## 2020-05-12 DIAGNOSIS — I700 Atherosclerosis of aorta: Secondary | ICD-10-CM

## 2020-05-12 DIAGNOSIS — I10 Essential (primary) hypertension: Secondary | ICD-10-CM | POA: Diagnosis not present

## 2020-05-12 DIAGNOSIS — J42 Unspecified chronic bronchitis: Secondary | ICD-10-CM | POA: Insufficient documentation

## 2020-05-12 MED ORDER — AMLODIPINE BESYLATE 5 MG PO TABS: 5.0000 mg | ORAL_TABLET | Freq: Every day | ORAL | 3 refills | Status: AC

## 2020-05-12 MED ORDER — OLMESARTAN MEDOXOMIL 40 MG PO TABS: 40.0000 mg | ORAL_TABLET | Freq: Every day | ORAL | 3 refills | Status: AC

## 2020-05-12 MED ORDER — TAMSULOSIN HCL 0.4 MG PO CAPS: 0.4000 mg | ORAL_CAPSULE | Freq: Every day | ORAL | 1 refills | Status: AC

## 2020-05-12 NOTE — Patient Instructions (Addendum)
Call [facility] to schedule an appt for prostate eval: [phone].  Go to lab for blood draw and urine collection.

## 2020-05-12 NOTE — Assessment & Plan Note (Signed)
Jack [name] declined use of statin despite recent CVA and HTN. Jack [name] is aware of his decision  Lipid Panel     Component Value Date/Time   CHOL 158 04/02/2020 1300   TRIG 116 04/02/2020 1300   HDL 32 (L) 04/02/2020 1300   CHOLHDL 4.9 04/02/2020 1300   VLDL 23 04/02/2020 1300   LDLCALC 103 (H) 04/02/2020 1300

## 2020-05-12 NOTE — Assessment & Plan Note (Addendum)
Wife reports persistent urinary frequency and incontinence. Check urinalysis and PSA today Advised to schedule app[facility] as previously discussed

## 2020-05-12 NOTE — Assessment & Plan Note (Signed)
Stable with no acute exacerbation. No need for SABA or LABA or ICS at this time

## 2020-05-12 NOTE — Progress Notes (Signed)
Subjective:  Patient ID: Jack [name], male    DOB: July 01, 1934  Age: 86 y.o. MRN: [id number]  CC: Hospitalization Follow-up (Pt went to hospital on 04/02/20 due to stroke and was there for a week before being discharged on 04/08/2020. Pt wife states he has been improving since being home. )  HPI  Aortic atherosclerosis [facility]) Jack [name] declined use of statin despite recent CVA and HTN. Jack [name] is aware of his decision  Lipid Panel     Component Value Date/Time   CHOL 158 04/02/2020 1300   TRIG 116 04/02/2020 1300   HDL 32 (L) 04/02/2020 1300   CHOLHDL 4.9 04/02/2020 1300   VLDL 23 04/02/2020 1300   LDLCALC 103 (H) 04/02/2020 1300    Chronic bronchitis (HCC) Stable with no acute exacerbation. No need for SABA or LABA or ICS at this time  Benign prostatic hyperplasia with urinary frequency Wife reports persistent urinary frequency and incontinence. 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 Current use of ASA 81mg . Reports functional bowel and bladder incontinence since discharge from rehab, no diarrhea. No melena/hematochezia. Has bedside commode and urinal at home. Ongoing Home PT 2x/week. Jack [name] declines any need for additional help at home. She is assisted by adult son and her siblings BP at goal with benicar and amlodipine. Jack [name] declined use of statin Has upcoming appt with neurology 05/19/2020  Reviewed past Medical, Social and Family history today.  Outpatient Medications Prior to Visit  Medication Sig Dispense Refill  . acetaminophen (TYLENOL) 325 MG tablet Take 1-2 tablets (325-650 mg total) by mouth every 4 (four) hours as needed for mild pain.    Marland Kitchen donepezil (ARICEPT) 10 MG tablet Take 1 tablet (10 mg total) by mouth at  bedtime. 90 tablet 3  . methylphenidate (RITALIN) 5 MG tablet Take 1 tablet (5 mg total) by mouth 2 (two) times daily with breakfast and lunch. 60 tablet 0  . pantoprazole (PROTONIX) 40 MG tablet Take 1 tablet (40 mg total) by mouth daily. 30 tablet 0  . QUEtiapine (SEROQUEL) 25 MG tablet Take 1 tablet (25 mg total) by mouth at bedtime. 30 tablet 0  . senna-docusate (SENOKOT-S) 8.6-50 MG tablet Take 1 tablet by mouth 2 (two) times daily.    Marland Kitchen amLODipine (NORVASC) 5 MG tablet Take 1 tablet (5 mg total) by mouth daily. 30 tablet 0  . olmesartan (BENICAR) 40 MG tablet Take 1 tablet (40 mg total) by mouth daily. No additional refills without and office visit. 90 tablet 0  . tamsulosin (FLOMAX) 0.4 MG CAPS capsule Take 1 capsule (0.4 mg total) by mouth daily after breakfast. 30 capsule 0  . melatonin 3 MG TABS tablet Take 1 tablet (3 mg total) by mouth at bedtime as needed (insomnia). (Patient not taking: Reported on 05/12/2020) 20 tablet 0   No facility-administered medications prior to visit.    ROS See HPI  Objective:  BP 114/70 (BP Location: Left Arm, Patient Position: Sitting, Cuff Size: Normal)   Pulse 100   Temp 97.7 F (36.5 C) (Temporal)   Ht 5\' 1"  (1.549 m)   Wt 146 lb 3.2 oz (66.3 kg)   SpO2 97%   BMI 27.62 kg/m   Physical Exam Cardiovascular:     Rate and Rhythm:  Normal rate and regular rhythm.     Pulses: Normal pulses.     Heart sounds: Normal heart sounds.  Pulmonary:     Effort: Pulmonary effort is normal.     Breath sounds: Normal breath sounds.  Abdominal:     General: Bowel sounds are normal.     Tenderness: There is no abdominal tenderness.  Musculoskeletal:     Right lower leg: No edema.     Left lower leg: No edema.  Neurological:     Mental Status: He is alert.     Comments: Oriented to person and place. Able to identify wife by name  Psychiatric:        Mood and Affect: Mood normal.        Behavior: Behavior normal.    Assessment & Plan:  This visit  occurred during the SARS-CoV-2 public health emergency.  Safety protocols were in place, including screening questions prior to the visit, additional usage of staff PPE, and extensive cleaning of exam room while observing appropriate contact time as indicated for disinfecting solutions.   Jack [name] was seen today for hospitalization follow-up.  Diagnoses and all orders for this visit:  Essential hypertension -     olmesartan (BENICAR) 40 MG tablet; Take 1 tablet (40 mg total) by mouth daily. -     amLODipine (NORVASC) 5 MG tablet; Take 1 tablet (5 mg total) by mouth daily.  Elevated PSA, less than 10 ng/ml -     Urinalysis w microscopic + reflex cultur -     PSA, Medicare  History of CVA with residual deficit  Benign prostatic hyperplasia with urinary frequency -     Urinalysis w microscopic + reflex cultur -     PSA, Medicare -     tamsulosin (FLOMAX) 0.4 MG CAPS capsule; Take 1 capsule (0.4 mg total) by mouth daily after breakfast.  Aortic atherosclerosis (HCC)  Chronic bronchitis, unspecified chronic bronchitis type ([place])    Problem List Items Addressed This Visit      Cardiovascular and Mediastinum   Aortic atherosclerosis [facility])    Jack [name] declined use of statin despite recent CVA and HTN. Jack [name] is aware of his decision  Lipid Panel     Component Value Date/Time   CHOL 158 04/02/2020 1300   TRIG 116 04/02/2020 1300   HDL 32 (L) 04/02/2020 1300   CHOLHDL 4.9 04/02/2020 1300   VLDL 23 04/02/2020 1300   LDLCALC 103 (H) 04/02/2020 1300        Relevant Medications   olmesartan (BENICAR) 40 MG tablet   amLODipine (NORVASC) 5 MG tablet   Essential hypertension - Primary   Relevant Medications   olmesartan (BENICAR) 40 MG tablet   amLODipine (NORVASC) 5 MG tablet     Respiratory   Chronic bronchitis (HCC)    Stable with no acute exacerbation. No need for SABA or LABA or ICS at this time        Other   Benign prostatic hyperplasia with urinary frequency     Wife reports persistent urinary frequency and incontinence. 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 Current use of ASA 81mg . Reports functional bowel and bladder incontinence since discharge from rehab, no diarrhea. No melena/hematochezia. Has bedside commode and urinal at home. Ongoing Home PT 2x/week. Jack [name] declines any need for additional help at home. She is assisted by adult son and her siblings BP at goal with benicar and amlodipine. Mr. Schnyder declined use of statin Has upcoming appt with neurology 05/19/2020         Follow-up: Return in about 3 months (around 08/12/2020) for HTN (F14f, 34mins).  Wilfred Lacy, NP

## 2020-05-12 NOTE — Assessment & Plan Note (Addendum)
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 Current use of ASA 81mg . Reports functional bowel and bladder incontinence since discharge from rehab, no diarrhea. No melena/hematochezia. Has bedside commode and urinal at home. Ongoing Home PT 2x/week. Jack [name] declines any need for additional help at home. She is assisted by adult son and her siblings BP at goal with benicar and amlodipine. Mr. Age declined use of statin Has upcoming appt with neurology 05/19/2020

## 2020-05-13 LAB — URINALYSIS W MICROSCOPIC + REFLEX CULTURE
Bacteria, UA: NONE SEEN /HPF
Bilirubin Urine: NEGATIVE
Glucose, UA: NEGATIVE
Hgb urine dipstick: NEGATIVE
Hyaline Cast: NONE SEEN /LPF
Ketones, ur: NEGATIVE
Leukocyte Esterase: NEGATIVE
Nitrites, Initial: NEGATIVE
Protein, ur: NEGATIVE
Specific Gravity, Urine: 1.014 (ref 1.001–1.03)
Squamous Epithelial / HPF: NONE SEEN /HPF (ref <=5)
WBC, UA: NONE SEEN /HPF (ref 0–5)
pH: 5.5 (ref 5.0–8.0)

## 2020-05-13 LAB — PSA, MEDICARE: PSA: 7.47 ng/ml — ABNORMAL HIGH (ref 0.10–4.00)

## 2020-05-13 LAB — NO CULTURE INDICATED

## 2020-05-19 ENCOUNTER — Ambulatory Visit: Payer: Medicare Other | Admitting: Adult Health

## 2020-05-19 ENCOUNTER — Telehealth: Payer: Self-pay | Admitting: Adult Health

## 2020-05-19 NOTE — Telephone Encounter (Signed)
Pt.'s wife called to cancel appt. As husband is not feeling well.

## 2020-05-27 ENCOUNTER — Ambulatory Visit (INDEPENDENT_AMBULATORY_CARE_PROVIDER_SITE_OTHER): Payer: Medicare Other | Admitting: Adult Health

## 2020-05-27 ENCOUNTER — Other Ambulatory Visit: Payer: Self-pay

## 2020-05-27 ENCOUNTER — Encounter: Payer: Self-pay | Admitting: Adult Health

## 2020-05-27 VITALS — BP 124/77 | HR 99 | Ht 61.0 in | Wt 147.0 lb

## 2020-05-27 DIAGNOSIS — Z8673 Personal history of transient ischemic attack (TIA), and cerebral infarction without residual deficits: Secondary | ICD-10-CM | POA: Diagnosis not present

## 2020-05-27 DIAGNOSIS — I619 Nontraumatic intracerebral hemorrhage, unspecified: Secondary | ICD-10-CM

## 2020-05-27 DIAGNOSIS — F0150 Vascular dementia without behavioral disturbance: Secondary | ICD-10-CM | POA: Diagnosis not present

## 2020-05-27 NOTE — Progress Notes (Signed)
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  Evaluated by Dr. Leonie Man in 12/2018 due to gradual worsening of cognition and memory loss likely related to vascular dementia.    Today, 05/27/2020, [name] returns for hospital follow-up regarding recent Jack [name].  Previously seen 11/28/2019 for vascular dementia stable at that time.  He presented to [facility] ED on 04/02/2020 with low-grade fever and worsening confusion and stroke work-up revealed L caudate head ICH w/ IVH likely due to small vessel disease with HTN.  Prior MRIs negative for evidence of CAA, AVM or aneurysm.  Serial CT heads stable appearance of hematoma and ventricle size.  2D echo EF 65 to 70%.  Previously on aspirin 81 mg daily discontinued neuroimaging given hemorrhage. Hx of HTN treated with Cleviprex with BP stabilizing during admission and resumed home BP meds. LDL 103 and held statin in setting of acute hemorrhage.  Other stroke risk factors include advanced age, no PFO, atrial fibrillation not on AC candidate, and prior history of stroke left MCA d/t AF not on [facility] 2017 (placed on aspirin as he has not a long-term AC candidate), hypertensive R BG ICH 2015 and right MCA territory infarcts 2009.  Other active problems during admission including acute pulmonary edema and resolved leukocytosis.  Evaluated by therapies and recommended discharge to CIR for ongoing therapy needs as he requires max assistance for mobility and total assist with ADLs, severe cognitive impairment and dysphagia on dysphagia 1 diet.  Improvement during CIR requiring min assist with ADL tasks,  contact-guard to supervision for mobility, supervision to mod assist depending on cognitive tasks and dysphagia 3 diet.  Discharged home with recommendation of home health therapy on 04/23/2020 (15 day stay).  Since discharge, he has been recovering well but continues to have mild gait difficulties and slight worsening of prior left-sided deficits. Per wife, these have been improving working with home health therapies. Denies residual swallowing difficulties and currently on a regular diet. Denies new or worsening stroke/TIA symptoms. Blood pressure today 124/77. HH therapy monitors at home which has been stable. Per wife, cognition has been stable without worsening after recent stroke. MMSE today 17/30 (prior 15/30). Remains on Aricept 10 mg nightly without side effects. No concerns at this time.     REVIEW OF SYSTEMS: Full 14 system review of systems performed and notable for those listed in HPI and all other systems negative    ALLERGIES: Allergies  Allergen Reactions  . Sulfa Antibiotics Swelling    Swelling in groin area    HOME MEDICATIONS: Outpatient Medications Prior to Visit  Medication Sig Dispense Refill  . acetaminophen (TYLENOL) 325 MG tablet Take 1-2 tablets (325-650 mg total) by mouth every 4 (four) hours as needed for mild pain.    Marland Kitchen amLODipine (NORVASC) 5 MG tablet Take 1 tablet (5 mg total) by mouth daily. 90 tablet 3  . donepezil (ARICEPT) 10 MG tablet Take 1 tablet (10 mg total) by mouth at bedtime. 90 tablet 3  . methylphenidate (RITALIN) 5 MG tablet Take 1 tablet (5 mg total) by mouth 2 (two) times daily with breakfast and lunch. 60 tablet 0  . pantoprazole (  PROTONIX) 40 MG tablet Take 1 tablet (40 mg total) by mouth daily. 30 tablet 0  . QUEtiapine (SEROQUEL) 25 MG tablet Take 1 tablet (25 mg total) by mouth at bedtime. 30 tablet 0  . senna-docusate (SENOKOT-S) 8.6-50 MG tablet Take 1 tablet by mouth 2 (two) times daily.    . tamsulosin (FLOMAX) 0.4 MG CAPS capsule  Take 1 capsule (0.4 mg total) by mouth daily after breakfast. 90 capsule 1  . melatonin 3 MG TABS tablet Take 1 tablet (3 mg total) by mouth at bedtime as needed (insomnia). (Patient not taking: Reported on 05/12/2020) 20 tablet 0  . olmesartan (BENICAR) 40 MG tablet Take 1 tablet (40 mg total) by mouth daily. 90 tablet 3   No facility-administered medications prior to visit.    PAST MEDICAL HISTORY: Past Medical History:  Diagnosis Date  . Acute CVA (cerebrovascular accident) ([place]) 07/05/2016  . Atrial fibrillation ([place])    Dr. Tollie Eth  . BPH (benign prostatic hypertrophy) 2012   prior therapy with finasteride, but as of 03/2015 no c/o; Dr. Luanne Bras  . Cancer ([place])   . Cellulitis of face 11/08   [facility]  . CVA (cerebral infarction) 2009, 2015  . Echocardiogram abnormal 02/11/2010   LVEF 70%, severe mitral regurgitation, pulm HTN   . ED (erectile dysfunction)    Viagra prn, consult with Urology 2012  . Elevated PSA 2012   BPH; Dr. Luanne Bras, [facility]; no more PSAs needed as of 2012 consult  . Gait abnormality    uses cane, s/p CVA  . Hemiparesis affecting left side as late effect of cerebrovascular accident ([place])   . Hiatal hernia    per 2008 EUS  . Hyperlipidemia   . Hypertension   . Microscopic hematuria    chronic, eval 2012 with Urology, benign at that time  . Obstructive uropathy 2012   Dr. Luanne Bras  . Pancreatic mass 01/2006   inconsequential per CT 2007  . 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 Patent foramen ovale    hx/o, notation of fistula on echocardiogram from 2009  . Prophylactic antibiotic 2016   requires due to valve disease  . Schatzki's ring 2008   per endoscopic ultrasound; Dr. Janetta Hora. [name]  . Stroke ([place])   . Ventral hernia    surgery consult 01/2010 with Dr. Excell Seltzer, elective repair if desired , but he declined  . Wears hearing aid     PAST  SURGICAL HISTORY: Past Surgical History:  Procedure Laterality Date  . CARDIAC CATHETERIZATION  02/2010   LVEF 70%, severe mitral regurgitation, pulm HTN; prior in 01/2006  . INGUINAL HERNIA REPAIR     bilat  . MITRAL VALVE REPAIR  01/2006   mitral repair with Maze procedure, tricuspid annuloplasty, MAZE and LAA oversewn by Dr. Roxy Manns    FAMILY HISTORY: Family History  Family history unknown: Yes    SOCIAL HISTORY: Social History   Socioeconomic History  . Marital status: Married    Spouse name: [name]  . Number of children: Not on file  . Years of education: Not on file  . Highest education level: Not on file  Occupational History  . Not on file  Tobacco Use  . Smoking status: Never Smoker  . Smokeless tobacco: Never Used  Substance and Sexual Activity  . Alcohol use: No  . Drug use: No  . Sexual activity: Not on file  Other Topics Concern  .  Not on file  Social History Narrative   Married, exercises with stationary bike 5 days per week.   Eats healthy.  As of 03/2015   Social Determinants of Health   Financial Resource Strain:   . Difficulty of Paying Living Expenses: Not on file  Food Insecurity:   . Worried About Charity fundraiser in the Last Year: Not on file  . Ran Out of Food in the Last Year: Not on file  Transportation Needs:   . Lack of Transportation (Medical): Not on file  . Lack of Transportation (Non-Medical): Not on file  Physical Activity:   . Days of Exercise per Week: Not on file  . Minutes of Exercise per Session: Not on file  Stress:   . Feeling of Stress : Not on file  Social Connections:   . Frequency of Communication with Friends and Family: Not on file  . Frequency of Social Gatherings with Friends and Family: Not on file  . Attends Religious Services: Not on file  . Active Member of Clubs or Organizations: Not on file  . Attends Archivist Meetings: Not on file  . Marital Status: Not on file  Intimate Partner Violence:   .  Fear of Current or Ex-Partner: Not on file  . Emotionally Abused: Not on file  . Physically Abused: Not on file  . Sexually Abused: Not on file     PHYSICAL EXAM  Vitals:   05/27/20 0943  BP: 124/77  Pulse: 99  Weight: 147 lb (66.7 kg)  Height: 5\' 1"  (1.549 m)   Body mass index is 27.78 kg/m.  Generalized: Well developed, pleasant elderly Caucasian male, in no acute distress  Head: normocephalic and atraumatic,. Oropharynx benign  Neck: Supple, no carotid bruits  Cardiac: Regular rate rhythm, no murmur  Musculoskeletal: No deformity   Neurological examination   Mentation: Alert and awake. Oriented to time and place. Minimal conversation during visit but answered questions appropriately and able to follow commands without difficulty. MMSE - Mini Mental State Exam 05/27/2020 11/28/2019 05/30/2019  Orientation to time 0 1 4  Orientation to Place 5 3 4   Registration 3 1 3   Attention/ Calculation 0 0 1  Attention/Calculation-comments - refused -  Recall 3 3 3   Language- name 2 objects 2 2 2   Language- repeat 0 1 1  Language- follow 3 step command 3 3 3   Language- read & follow direction 1 1 1   Write a sentence 0 0 1  Copy design 0 0 0  Copy design-comments 4 animals - named  animals  Total score 17 15 23    Cranial Nerves: Pupils equal, briskly reactive to light. Extraocular movements full without nystagmus. Visual fields full to confrontation.  HOH bilaterally. Facial sensation intact. Face, tongue, palate moves normally and symmetrically.  Motor: Normal bulk and tone. Normal strength in all tested extremity muscles except mild LUE weakness with decreased dexterity. Sensory.: intact to touch , pinprick , position and vibratory sensation.  Coordination: Rapid alternating movements slightly diminished left hand. Finger-to-nose and heel-to-shin performed accurately bilaterally.  Gait and Station: Arises from chair with mild difficulty. Stance is hunched. Gait demonstrates   decreased stride length and LLE circumduction with use of rolling walker Reflexes: 1+ and symmetric. Toes downgoing.       ASSESSMENT AND PLAN 70 year caucasian male with recent L caudate head ICH w/ IVH likely due to small vessel disease with HTN. Prior history of cardiac embolic left frontal MCA branch infarct  in December 2017 secondary to atrial fibrillation not on anticoagulation due to prior history of right thalamic ICH in 07/2013 while on anticoagulation.  Residual stroke deficit of mild left hemiparesis, gait impairment and cognitive impairment.  Vascular risk factors include HTN, HLD, and atrial fibrillation.      1. L caudate head ICH :  2. Hx of multiple prior strokes: a. Residual Gait difficulty from recent stroke with chronic left hemiparesis and cognitive impairment. Advised continued participation with home health therapies for hopeful ongoing recovery.  b. Previously on aspirin but discontinued in setting of recent ICH. Advised patient and wife that I will further discuss restarting aspirin for secondary stroke prevention as well as initiating statin c. Hx of A. Fib but not anticoagulant candidate due to recent and prior history of ICH d. Discussed secondary stroke prevention measures and importance of close PCP f/u for aggressive stroke risk factor management including BP goal<130/90 and HLD with LDL goal<70 3. Vascular cognitive impairment: a. Stable even after recent stroke with MMSE today 17/30 (prior 15/30) b. Continue Aricept 10 mg nightly c. No behavioral concerns    Follow-up in 6 months or call earlier if needed   CC:  [facility] provider: Dr. Trudi Ida, Charlene Brooke, NP     I spent 45 minutes of face-to-face and non-face-to-face time with patient and wife.  This included previsit chart review including recent hospitalization pertinent progress notes, lab work and imaging, lab review, study review, order entry, electronic health record documentation, patient and  wife education and discussion regarding recent [place], history of multiple prior strokes, residual deficits, importance of managing stroke risk factors, completion and review of MMSE and vascular cognitive impairment and answered all questions to patient and wife satisfaction   Frann Rider, AGNP-BC  [facility] [address] [place], [place] [address]  Phone [phone] Fax [phone] Note: This document was prepared with digital dictation and possible smart phrase technology. Any transcriptional errors that result from this process are unintentional.

## 2020-05-27 NOTE — Patient Instructions (Addendum)
Continue working with home health therapies - consider doing additional outpatient therapies once completed if needed  Will further discuss restarting aspirin and starting a cholesterol medication with Dr. Leonie Man for stroke prevention  Continue aricept for cognitive impairment   Continue to follow up with PCP regarding cholesterol and blood pressure management  Maintain strict control of hypertension with blood pressure goal below 130/90 and cholesterol with LDL cholesterol (bad cholesterol) goal below 70 mg/dL.      Followup in the future with me in 6 months or call earlier if needed       Thank you for coming to see [place] at [facility]. I hope we have been able to provide you high quality care today.  You may receive a patient satisfaction survey over the next few weeks. We would appreciate your feedback and comments so that we may continue to improve ourselves and the health of our patients.

## 2020-05-29 ENCOUNTER — Encounter: Payer: Self-pay | Admitting: Nurse Practitioner

## 2020-05-29 ENCOUNTER — Telehealth: Payer: Self-pay | Admitting: Nurse Practitioner

## 2020-05-29 NOTE — Telephone Encounter (Signed)
Pt was no show for appt 05/05/20 and 05/07/20. Fees waived for both given current health concerns. Visit was completed 05/12/20. 1st & 2nd occurrence. Letter mailed.

## 2020-06-02 ENCOUNTER — Telehealth: Payer: Self-pay | Admitting: Nurse Practitioner

## 2020-06-02 ENCOUNTER — Telehealth: Payer: Self-pay | Admitting: Adult Health

## 2020-06-02 MED ORDER — ASPIRIN EC 81 MG PO TBEC: 81.0000 mg | DELAYED_RELEASE_TABLET | Freq: Every day | ORAL | 11 refills | Status: AC

## 2020-06-02 NOTE — Telephone Encounter (Signed)
-----   Message from Garvin Fila, MD sent at 06/02/2020  6:37 AM EST -----   ----- Message ----- From: Frann Rider, NP Sent: 05/27/2020  11:39 AM EST To: Garvin Fila, MD  Would you recommend restarting aspirin 81 mg daily in setting of recent ICH? Prior history of strokes and atrial fibrillation not a AC candidate due to hx of prior ICH. Also, LDL 102 during recent admission -would you recommend initiating statin?

## 2020-06-02 NOTE — Progress Notes (Signed)
I agree with the above plan 

## 2020-06-02 NOTE — Telephone Encounter (Signed)
Spoke with [facility] [employer] and approved order for extended care.

## 2020-06-02 NOTE — Progress Notes (Signed)
Yes start aspirin 81 mg

## 2020-06-02 NOTE — Telephone Encounter (Signed)
Per Dr. Lenell Antu recommendations, recommend restarting aspirin 81 mg daily for secondary stroke prevention.  This was previously discussed with both patient and wife during prior visit and aware that we are waiting for further guidance per Dr. Leonie Man prior to restarting.

## 2020-06-02 NOTE — Telephone Encounter (Signed)
Flor at [employer] at home calling, Need verbal order to ext care. Callback [phone]

## 2020-06-03 NOTE — Telephone Encounter (Signed)
Spoke to pt's wife relayed message from NP she voices understanding and pt will call back if he has further questions.

## 2020-06-12 ENCOUNTER — Other Ambulatory Visit: Payer: Self-pay

## 2020-06-12 ENCOUNTER — Encounter: Payer: Self-pay | Admitting: Physical Medicine & Rehabilitation

## 2020-06-12 ENCOUNTER — Encounter: Payer: Medicare Other | Attending: Registered Nurse | Admitting: Physical Medicine & Rehabilitation

## 2020-06-12 VITALS — BP 123/78 | HR 92 | Temp 98.7°F

## 2020-06-12 DIAGNOSIS — I6930 Unspecified sequelae of cerebral infarction: Secondary | ICD-10-CM | POA: Diagnosis not present

## 2020-06-12 NOTE — Progress Notes (Signed)
Subjective:    Patient ID: Jack [name], male    DOB: [birth year], 86 y.o.   MRN: [id number] 86 y.o. male with history of L-MCA infarct with residual left hemiparesis A. fib-no anticoagulation due to prior ICH, Schatzki's ring, vascular dementia who was admitted on 04/02/2020 with low-grade fever and worsening of confusion.  He was febrile and tachycardic at admission with positive UA and was treated with Rocephin briefly due to concerns of UTI.  CT head showed suspicion of hemorrhage in left basal ganglia and left caudate region felt to be hypertensive in nature.  He was treated with Cleviprex for BP control and ASA was discontinued.  Neurology felt that ICH/IVH was due to small vessel disease with hypertension.  Hospital course was significant for issues with fluid overload requiring IV diuresis x2.  He had defervesced and leukocytosis was resolving.  Follow-up CT head showed slight decrease in basal ganglia hemorrhage.  He continued to have issues with lethargy, balance deficits with left lateral lean as well as difficulty with ADLs.  CIR was recommended due to functional decline.  Admit date: 04/08/2020 Discharge date: 04/23/2020  HPI  Living at home with his wife. Showers with shower chair 3 x per week Walks with walker  Amb up and down 6 steps at home  No falls The patient continues with home health PT OT They are down to once a week.  His wife feels like he is nearing his premorbid functional status.  He has had some limitation since his stroke in 2015 Pain Inventory Average Pain 0 Pain Right Now 0 My pain is na  LOCATION OF PAIN  na  BOWEL Number of stools per week: 7 Oral laxative use No  Type of laxative na Enema or suppository use No  History of colostomy No  Incontinent No   BLADDER Normal In and out cath, frequency na Able to self cath na Bladder incontinence No  Frequent urination Yes  Leakage with coughing No  Difficulty starting stream No  Incomplete bladder  emptying No    Mobility use a walker ability to climb steps?  no do you drive?  no use a wheelchair  Function retired I need assistance with the following:  dressing  Neuro/Psych bladder control problems trouble walking  Prior Studies no  Physicians involved in your care Any changes since last visit?  no   Family History  Family history unknown: Yes   Social History   Socioeconomic History  . Marital status: Married    Spouse name: [name]  . Number of children: Not on file  . Years of education: Not on file  . Highest education level: Not on file  Occupational History  . Not on file  Tobacco Use  . Smoking status: Never Smoker  . Smokeless tobacco: Never Used  Substance and Sexual Activity  . Alcohol use: No  . Drug use: No  . Sexual activity: Not on file  Other Topics Concern  . Not on file  Social History Narrative   Married, exercises with stationary bike 5 days per week.   Eats healthy.  As of 03/2015   Social Determinants of Health   Financial Resource Strain:   . Difficulty of Paying Living Expenses: Not on file  Food Insecurity:   . Worried About Charity fundraiser in the Last Year: Not on file  . Ran Out of Food in the Last Year: Not on file  Transportation Needs:   . Lack of Transportation (Medical):  Not on file  . Lack of Transportation (Non-Medical): Not on file  Physical Activity:   . Days of Exercise per Week: Not on file  . Minutes of Exercise per Session: Not on file  Stress:   . Feeling of Stress : Not on file  Social Connections:   . Frequency of Communication with Friends and Family: Not on file  . Frequency of Social Gatherings with Friends and Family: Not on file  . Attends Religious Services: Not on file  . Active Member of Clubs or Organizations: Not on file  . Attends Archivist Meetings: Not on file  . Marital Status: Not on file   Past Surgical History:  Procedure Laterality Date  . CARDIAC CATHETERIZATION   02/2010   LVEF 70%, severe mitral regurgitation, pulm HTN; prior in 01/2006  . INGUINAL HERNIA REPAIR     bilat  . MITRAL VALVE REPAIR  01/2006   mitral repair with Maze procedure, tricuspid annuloplasty, MAZE and LAA oversewn by Dr. Roxy Manns   Past Medical History:  Diagnosis Date  . Acute CVA (cerebrovascular accident) ([place]) 07/05/2016  . Atrial fibrillation ([place])    Dr. Tollie Eth  . BPH (benign prostatic hypertrophy) 2012   prior therapy with finasteride, but as of 03/2015 no c/o; Dr. Luanne Bras  . Cancer ([place])   . Cellulitis of face 11/08   [facility]  . CVA (cerebral infarction) 2009, 2015  . Echocardiogram abnormal 02/11/2010   LVEF 70%, severe mitral regurgitation, pulm HTN   . ED (erectile dysfunction)    Viagra prn, consult with Urology 2012  . Elevated PSA 2012   BPH; Dr. Luanne Bras, [facility]; no more PSAs needed as of 2012 consult  . Gait abnormality    uses cane, s/p CVA  . Hemiparesis affecting left side as late effect of cerebrovascular accident ([place])   . Hiatal hernia    per 2008 EUS  . Hyperlipidemia   . Hypertension   . Microscopic hematuria    chronic, eval 2012 with Urology, benign at that time  . Obstructive uropathy 2012   Dr. Luanne Bras  . Pancreatic mass 01/2006   inconsequential per CT 2007  . 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 Patent foramen ovale    hx/o, notation of fistula on echocardiogram from 2009  . Prophylactic antibiotic 2016   requires due to valve disease  . Schatzki's ring 2008   per endoscopic ultrasound; Dr. Janetta Hora. [name]  . Stroke ([place])   . Ventral hernia    surgery consult 01/2010 with Dr. Excell Seltzer, elective repair if desired , but he declined  . Wears hearing aid    BP 123/78   Pulse 92   Temp 98.7 F (37.1 C)   SpO2 95%   Opioid Risk Score:   Fall Risk Score:  `1  Depression screen PHQ 2/9  Depression screen [facility] 2/9  05/02/2020 01/26/2019 12/24/2016 09/15/2016 07/27/2016 07/20/2016 02/24/2015  Decreased Interest 0 0 0 0 0 0 0  Down, Depressed, Hopeless 0 0 0 0 0 0 0  PHQ - 2 Score 0 0 0 0 0 0 0  Altered sleeping 1 - - - - 0 -  Tired, decreased energy 0 - - - - 2 -  Change in appetite 0 - - - - 2 -  Feeling bad or failure about yourself  2 - - - - 0 -  Trouble concentrating 0 - - - -  0 -  Moving slowly or fidgety/restless 0 - - - - 0 -  Suicidal thoughts 0 - - - - 0 -  PHQ-9 Score 3 - - - - 4 -  Some recent data might be hidden  12  Review of Systems     Objective:   Physical Exam Constitutional:      Appearance: He is normal weight.  HENT:     Head: Normocephalic and atraumatic.  Eyes:     Extraocular Movements: Extraocular movements intact.     Conjunctiva/sclera: Conjunctivae normal.     Pupils: Pupils are equal, round, and reactive to light.  Neurological:     Mental Status: He is alert.  Psychiatric:        Attention and Perception: Attention normal. He is attentive.        Mood and Affect: Mood normal. Affect is blunt.        Speech: Speech is delayed.        Behavior: Behavior is slowed and withdrawn.        Cognition and Memory: Cognition is impaired.           Assessment & Plan:  #46.  86 year old male with recurrent CVA, left basal ganglia small vessel hemorrhagic infarct.  Patient has global cognitive dysfunction with reduced attention concentration memory processing speed related to bilateral small vessel disease. He has no focal neurologic deficits at this time.  He is approaching his premorbid functional status.  He has required assistance from his wife since prior stroke in 2015.  Do not think outpatient therapy will be needed given that he is close to his premorbid functional status. Physical medicine rehab follow-up on as-needed basis Neurology follow-up already made for May 2022. Advised patient to follow-up with primary care provider

## 2020-06-12 NOTE — Patient Instructions (Signed)
Please call if you need orders for outpatient PT, OT  Please follow up with neurologist  And primary MD

## 2020-06-16 ENCOUNTER — Telehealth: Payer: Self-pay

## 2020-06-16 NOTE — Telephone Encounter (Signed)
Error

## 2020-08-11 ENCOUNTER — Telehealth: Payer: Self-pay | Admitting: Nurse Practitioner

## 2020-08-11 DIAGNOSIS — I63412 Cerebral infarction due to embolism of left middle cerebral artery: Secondary | ICD-10-CM

## 2020-08-11 DIAGNOSIS — F0281 Dementia in other diseases classified elsewhere with behavioral disturbance: Secondary | ICD-10-CM

## 2020-08-11 DIAGNOSIS — I69391 Dysphagia following cerebral infarction: Secondary | ICD-10-CM

## 2020-08-11 DIAGNOSIS — J42 Unspecified chronic bronchitis: Secondary | ICD-10-CM

## 2020-08-11 NOTE — Telephone Encounter (Signed)
Is she on DPR? Contact Ms. Loma Newton to confirm this request.

## 2020-08-11 NOTE — Telephone Encounter (Signed)
Caller Name: Mauricio Po, sister-in-law Call back phone #: [phone]  Reason for Call: Pt has had multiple strokes and can barely recognize family. Pt has "episodes" every couple of days. Pt wife has fallen recently and broken her clavicle. They are requesting referral to palliative care in the home. No close family at home to help.

## 2020-08-12 NOTE — Telephone Encounter (Signed)
Keaden Gunnoe called back to check on status of this, she said it was okay to talk to her sister Mauricio Po regarding referral

## 2020-08-14 NOTE — Telephone Encounter (Signed)
Referral sent 

## 2020-08-15 ENCOUNTER — Telehealth: Payer: Self-pay

## 2020-08-15 NOTE — Telephone Encounter (Signed)
Spoke with patient's wife [name] and scheduled an in-person Palliative Consult for 08/20/20 @ 1PM  COVID screening was negative. No pets in home. Patient lives with wife. His sister in law may be at consult also.   Consent obtained; updated Outlook/Netsmart/Team List and Epic.  Family is aware they will be receiving a call from NP the day before or day of to confirm appointment.

## 2020-08-17 ENCOUNTER — Emergency Department (HOSPITAL_COMMUNITY): Payer: Medicare Other

## 2020-08-17 ENCOUNTER — Other Ambulatory Visit: Payer: Self-pay

## 2020-08-17 ENCOUNTER — Emergency Department (HOSPITAL_COMMUNITY)
Admission: EM | Admit: 2020-08-17 | Discharge: 2020-09-09 | Disposition: E | Payer: Medicare Other | Attending: Emergency Medicine | Admitting: Emergency Medicine

## 2020-08-17 DIAGNOSIS — U071 COVID-19: Secondary | ICD-10-CM

## 2020-08-17 DIAGNOSIS — F0151 Vascular dementia with behavioral disturbance: Secondary | ICD-10-CM | POA: Diagnosis not present

## 2020-08-17 DIAGNOSIS — I10 Essential (primary) hypertension: Secondary | ICD-10-CM | POA: Diagnosis not present

## 2020-08-17 DIAGNOSIS — Z79899 Other long term (current) drug therapy: Secondary | ICD-10-CM | POA: Insufficient documentation

## 2020-08-17 DIAGNOSIS — Z7982 Long term (current) use of aspirin: Secondary | ICD-10-CM | POA: Diagnosis not present

## 2020-08-17 DIAGNOSIS — J9601 Acute respiratory failure with hypoxia: Secondary | ICD-10-CM | POA: Diagnosis not present

## 2020-08-17 DIAGNOSIS — J690 Pneumonitis due to inhalation of food and vomit: Secondary | ICD-10-CM | POA: Diagnosis not present

## 2020-08-17 DIAGNOSIS — R0603 Acute respiratory distress: Secondary | ICD-10-CM | POA: Diagnosis present

## 2020-08-17 DIAGNOSIS — Z859 Personal history of malignant neoplasm, unspecified: Secondary | ICD-10-CM | POA: Insufficient documentation

## 2020-08-17 DIAGNOSIS — R0602 Shortness of breath: Secondary | ICD-10-CM | POA: Diagnosis not present

## 2020-08-17 DIAGNOSIS — Z951 Presence of aortocoronary bypass graft: Secondary | ICD-10-CM | POA: Diagnosis not present

## 2020-08-17 LAB — COMPREHENSIVE METABOLIC PANEL
ALT: 15 U/L (ref 0–44)
AST: 24 U/L (ref 15–41)
Albumin: 3.9 g/dL (ref 3.5–5.0)
Alkaline Phosphatase: 41 U/L (ref 38–126)
Anion gap: 16 — ABNORMAL HIGH (ref 5–15)
BUN: 17 mg/dL (ref 8–23)
CO2: 22 mmol/L (ref 22–32)
Calcium: 9.2 mg/dL (ref 8.9–10.3)
Chloride: 104 mmol/L (ref 98–111)
Creatinine, Ser: 1.01 mg/dL (ref 0.61–1.24)
GFR, Estimated: >60 mL/min (ref >60)
Glucose, Bld: 168 mg/dL — ABNORMAL HIGH (ref 70–99)
Potassium: 3.4 mmol/L — ABNORMAL LOW (ref 3.5–5.1)
Sodium: 142 mmol/L (ref 135–145)
Total Bilirubin: 0.9 mg/dL (ref 0.3–1.2)
Total Protein: 7.2 g/dL (ref 6.5–8.1)

## 2020-08-17 LAB — CBC WITH DIFFERENTIAL/PLATELET
Abs Immature Granulocytes: 0.07 10*3/uL (ref 0.00–0.07)
Basophils Absolute: 0 10*3/uL (ref 0.0–0.1)
Basophils Relative: 0 %
Eosinophils Absolute: 0 10*3/uL (ref 0.0–0.5)
Eosinophils Relative: 0 %
HCT: 41.3 % (ref 39.0–52.0)
Hemoglobin: 12.7 g/dL — ABNORMAL LOW (ref 13.0–17.0)
Immature Granulocytes: 0 %
Lymphocytes Relative: 15 %
Lymphs Abs: 2.7 10*3/uL (ref 0.7–4.0)
MCH: 28 pg (ref 26.0–34.0)
MCHC: 30.8 g/dL (ref 30.0–36.0)
MCV: 91.2 fL (ref 80.0–100.0)
Monocytes Absolute: 1.6 10*3/uL — ABNORMAL HIGH (ref 0.1–1.0)
Monocytes Relative: 9 %
Neutro Abs: 13.8 10*3/uL — ABNORMAL HIGH (ref 1.7–7.7)
Neutrophils Relative %: 76 %
Platelets: 497 10*3/uL — ABNORMAL HIGH (ref 150–400)
RBC: 4.53 MIL/uL (ref 4.22–5.81)
RDW: 14.5 % (ref 11.5–15.5)
WBC: 18.3 10*3/uL — ABNORMAL HIGH (ref 4.0–10.5)
nRBC: 0 % (ref 0.0–0.2)

## 2020-08-17 LAB — APTT: aPTT: 30 seconds (ref 24–36)

## 2020-08-17 LAB — MAGNESIUM: Magnesium: 1.9 mg/dL (ref 1.7–2.4)

## 2020-08-17 LAB — LIPASE, BLOOD: Lipase: 25 U/L (ref 11–51)

## 2020-08-17 LAB — LACTIC ACID, PLASMA: Lactic Acid, Venous: 1.8 mmol/L (ref 0.5–1.9)

## 2020-08-17 LAB — PROTIME-INR
INR: 1.2 (ref 0.8–1.2)
Prothrombin Time: 14.4 seconds (ref 11.4–15.2)

## 2020-08-17 LAB — SARS CORONAVIRUS 2 BY RT PCR (HOSPITAL ORDER, PERFORMED IN ~~LOC~~ HOSPITAL LAB): SARS Coronavirus 2: POSITIVE — AB

## 2020-08-17 MED ORDER — SODIUM CHLORIDE 0.9 % IV BOLUS
500.0000 mL | Freq: Once | INTRAVENOUS | Status: AC
  Administered 2020-08-17: 500 mL via INTRAVENOUS

## 2020-08-17 MED ORDER — SODIUM CHLORIDE 0.9 % IV BOLUS: 500.0000 mL | Freq: Once | INTRAVENOUS | Status: DC

## 2020-08-17 MED ORDER — SODIUM CHLORIDE 0.9 % IV SOLN: 100.0000 mg | Freq: Every day | INTRAVENOUS | Status: DC

## 2020-08-17 MED ORDER — DEXAMETHASONE SODIUM PHOSPHATE 10 MG/ML IJ SOLN: 10.0000 mg | Freq: Once | INTRAMUSCULAR | Status: DC

## 2020-08-17 MED ORDER — SODIUM CHLORIDE 0.9 % IV SOLN
200.0000 mg | Freq: Once | INTRAVENOUS | Status: DC
  Filled 2020-08-17 (×2): qty 40

## 2020-08-17 MED ORDER — SODIUM CHLORIDE 0.9 % IV SOLN
2.0000 g | INTRAVENOUS | Status: DC
  Administered 2020-08-17: 2 g via INTRAVENOUS
  Filled 2020-08-17: qty 20

## 2020-08-17 MED ORDER — METRONIDAZOLE IN NACL 5-0.79 MG/ML-% IV SOLN
500.0000 mg | Freq: Once | INTRAVENOUS | Status: AC
  Administered 2020-08-17: 500 mg via INTRAVENOUS
  Filled 2020-08-17: qty 100

## 2020-08-17 MED ORDER — LACTATED RINGERS IV SOLN: INTRAVENOUS | Status: DC

## 2020-08-17 MED ORDER — SODIUM CHLORIDE 0.9 % IV SOLN
500.0000 mg | INTRAVENOUS | Status: DC
  Administered 2020-08-17: 500 mg via INTRAVENOUS
  Filled 2020-08-17: qty 500

## 2020-08-20 ENCOUNTER — Other Ambulatory Visit: Payer: Self-pay | Admitting: Nurse Practitioner

## 2020-08-22 LAB — CULTURE, BLOOD (SINGLE): Culture: NO GROWTH

## 2020-09-09 NOTE — ED Notes (Signed)
Pt's O2 sat in the 50s on NRB, EDP aware and at bedside.

## 2020-09-09 NOTE — ED Notes (Signed)
TOD 0304, called by Dr. Dina Rich, confirmed with bedside cardiac ultrasound.

## 2020-09-09 NOTE — ED Provider Notes (Signed)
[facility] EMERGENCY DEPARTMENT Provider Note   CSN: [id number] Arrival date & time: August 31, 2020  0106     History Chief Complaint  Patient presents with  . Shortness of Breath    Jack [name] is a 87 y.o. male.  HPI     This is an 87 year old male with a history of CVA, atrial fibrillation, dementia, hypertension, hyperlipidemia who presents with respiratory distress.  Per EMS they were called out to the home after the patient had multiple episodes of vomiting.  They noted him to be tachypneic, hypoxic, and tachycardic.  Initial O2 sats in the 70s.  He was noted to be in A. fib with RVR on the monitor.  EMS unaware of A. fib history.  Not on any anticoagulants.  Blood pressure 1 sustained 116 SBP in route.  No interventions for atrial fibrillation.  Patient unable to provide history.  Level 5 caveat  Past Medical History:  Diagnosis Date  . Acute CVA (cerebrovascular accident) ([place]) 07/05/2016  . Atrial fibrillation ([place])    Dr. Tollie Eth  . BPH (benign prostatic hypertrophy) 2012   prior therapy with finasteride, but as of 03/2015 no c/o; Dr. Luanne Bras  . Cancer ([place])   . Cellulitis of face 11/08   [facility]  . CVA (cerebral infarction) 2009, 2015  . Echocardiogram abnormal 02/11/2010   LVEF 70%, severe mitral regurgitation, pulm HTN   . ED (erectile dysfunction)    Viagra prn, consult with Urology 2012  . Elevated PSA 2012   BPH; Dr. Luanne Bras, [facility]; no more PSAs needed as of 2012 consult  . Gait abnormality    uses cane, s/p CVA  . Hemiparesis affecting left side as late effect of cerebrovascular accident ([place])   . Hiatal hernia    per 2008 EUS  . Hyperlipidemia   . Hypertension   . Microscopic hematuria    chronic, eval 2012 with Urology, benign at that time  . Obstructive uropathy 2012   Dr. Luanne Bras  . Pancreatic mass 01/2006   inconsequential per CT 2007  . 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 Patent foramen ovale    hx/o, notation of fistula on echocardiogram from 2009  . Prophylactic antibiotic 2016   requires due to valve disease  . Schatzki's ring 2008   per endoscopic ultrasound; Dr. Janetta Hora. [name]  . Stroke ([place])   . Ventral hernia    surgery consult 01/2010 with Dr. Excell Seltzer, elective repair if desired , but he declined  . Wears hearing aid     Patient Active Problem List   Diagnosis Date Noted  . Aortic atherosclerosis ([place]) 05/12/2020  . Chronic bronchitis ([place]) 05/12/2020  . Dementia ([place]) 04/25/2020  . Dysphagia due to recent stroke 04/07/2020  . Lesion of right ear 01/29/2019  . Vascular dementia with behavior disturbance ([place]) 01/29/2019  . HOH (hard of hearing) 01/29/2019  . Counseling regarding advanced directives 01/26/2019  . Benign prostatic hyperplasia with urinary frequency 01/26/2019  . Status post coronary artery bypass graft 05/05/2018  . Hyperglycemia 03/10/2017  . Elevated PSA, less than 10 ng/ml 03/10/2017  . Subcortical infarction ([place]) 07/09/2016  . PFO (patent foramen ovale)   . History of CVA with residual deficit   . Non compliance with medical treatment   . Hypokalemia   . Acute blood loss anemia   . Embolic stroke involving left middle cerebral artery ([place])   . TIA (  transient ischemic attack) 07/03/2016  . Right sided weakness 07/03/2016  . Microscopic hematuria 04/03/2015  . Hyperlipidemia 04/03/2015  . Wears hearing aid 04/03/2015  . Changing skin lesion 04/03/2015  . Paresthesia and pain of left extremity 04/02/2015  . Encounter for health maintenance examination in adult 04/02/2015  . Hernia of abdominal cavity 04/02/2015  . Impaired gait and mobility 04/02/2015  . Essential hypertension 03/03/2015  . Spastic hemiplegia affecting nondominant side ([place]) 12/14/2013  . ICH (intracerebral hemorrhage) ([facility]) - L caudate head w/ IVH and [facility] 08/02/2013  . Status post  mitral valve repair   . History of stroke   . History of atrial fibrillation   . Cyst and pseudocyst of pancreas 09/13/2007  . PVC (premature ventricular contraction)   . History of renal cyst     Past Surgical History:  Procedure Laterality Date  . CARDIAC CATHETERIZATION  02/2010   LVEF 70%, severe mitral regurgitation, pulm HTN; prior in 01/2006  . INGUINAL HERNIA REPAIR     bilat  . MITRAL VALVE REPAIR  01/2006   mitral repair with Maze procedure, tricuspid annuloplasty, MAZE and LAA oversewn by Dr. Roxy Manns       Family History  Family history unknown: Yes    Social History   Tobacco Use  . Smoking status: Never Smoker  . Smokeless tobacco: Never Used  Substance Use Topics  . Alcohol use: No  . Drug use: No    Home Medications Prior to Admission medications   Medication Sig Start Date End Date Taking? Authorizing Provider  acetaminophen (TYLENOL) 325 MG tablet Take 1-2 tablets (325-650 mg total) by mouth every 4 (four) hours as needed for mild pain. 04/22/20   Angiulli, Lavon Paganini, PA-C  amLODipine (NORVASC) 5 MG tablet Take 1 tablet (5 mg total) by mouth daily. 05/12/20   Nche, Charlene Brooke, NP  aspirin EC 81 MG tablet Take 1 tablet (81 mg total) by mouth daily. Swallow whole. 06/02/20   Frann Rider, NP  donepezil (ARICEPT) 10 MG tablet Take 1 tablet (10 mg total) by mouth at bedtime. 04/22/20   Angiulli, Lavon Paganini, PA-C  melatonin 3 MG TABS tablet Take 1 tablet (3 mg total) by mouth at bedtime as needed (insomnia). Patient not taking: Reported on 05/12/2020 04/22/20   Angiulli, Lavon Paganini, PA-C  methylphenidate (RITALIN) 5 MG tablet Take 1 tablet (5 mg total) by mouth 2 (two) times daily with breakfast and lunch. 04/22/20   Angiulli, Lavon Paganini, PA-C  olmesartan (BENICAR) 40 MG tablet Take 1 tablet (40 mg total) by mouth daily. 05/12/20   Nche, Charlene Brooke, NP  pantoprazole (PROTONIX) 40 MG tablet Take 1 tablet (40 mg total) by mouth daily. 04/22/20   Angiulli, Lavon Paganini,  PA-C  QUEtiapine (SEROQUEL) 25 MG tablet Take 1 tablet (25 mg total) by mouth at bedtime. 04/22/20   Angiulli, Lavon Paganini, PA-C  senna-docusate (SENOKOT-S) 8.6-50 MG tablet Take 1 tablet by mouth 2 (two) times daily. 04/08/20   Donzetta Starch, NP  tamsulosin (FLOMAX) 0.4 MG CAPS capsule Take 1 capsule (0.4 mg total) by mouth daily after breakfast. 05/12/20   Nche, Charlene Brooke, NP    Allergies    Sulfa antibiotics  Review of Systems   Review of Systems  Unable to perform ROS: Dementia    Physical Exam Updated Vital Signs BP (!) 57/32   Pulse 73   Temp 98.5 F (36.9 C) (Rectal)   Resp (!) 6   SpO2 (!) 68%   Physical  Exam Vitals and nursing note reviewed.  Constitutional:      Appearance: He is well-nourished. He is ill-appearing and diaphoretic.  HENT:     Head: Normocephalic and atraumatic.  Eyes:     Pupils: Pupils are equal, round, and reactive to light.  Cardiovascular:     Rate and Rhythm: Tachycardia present. Rhythm irregular.     Heart sounds: Normal heart sounds. No murmur heard.   Pulmonary:     Effort: Pulmonary effort is normal. No respiratory distress.     Breath sounds: Rhonchi and rales present. No wheezing.     Comments: Tachypnea noted, rhonchorous breath sounds in all lung fields, nonrebreather in place with O2 sats in the low 90s, increased accessory muscle use Abdominal:     General: Bowel sounds are normal.     Palpations: Abdomen is soft.     Tenderness: There is no abdominal tenderness. There is no rebound.     Comments: Large anterior ventral hernia, reducible  Musculoskeletal:        General: No edema.     Cervical back: Neck supple.     Right lower leg: No tenderness. No edema.     Left lower leg: No tenderness. No edema.  Lymphadenopathy:     Cervical: No cervical adenopathy.  Skin:    General: Skin is warm.  Neurological:     Mental Status: He is alert.     Comments: Nonverbal, contractured, does not follow commands  Psychiatric:         Mood and Affect: Mood and affect normal.     Comments: Unable to assess     ED Results / Procedures / Treatments   Labs (all labs ordered are listed, but only abnormal results are displayed) Labs Reviewed  SARS CORONAVIRUS 2 BY RT PCR ([facility]) - Abnormal; Notable for the following components:      Result Value   SARS Coronavirus 2 POSITIVE (*)    All other components within normal limits  COMPREHENSIVE METABOLIC PANEL - Abnormal; Notable for the following components:   Potassium 3.4 (*)    Glucose, Bld 168 (*)    Anion gap 16 (*)    All other components within normal limits  CBC WITH DIFFERENTIAL/PLATELET - Abnormal; Notable for the following components:   WBC 18.3 (*)    Hemoglobin 12.7 (*)    Platelets 497 (*)    Neutro Abs 13.8 (*)    Monocytes Absolute 1.6 (*)    All other components within normal limits  CULTURE, BLOOD (SINGLE)  URINE CULTURE  LACTIC ACID, PLASMA  PROTIME-INR  APTT  MAGNESIUM  LIPASE, BLOOD  LACTIC ACID, PLASMA  URINALYSIS, ROUTINE W REFLEX MICROSCOPIC  I-STAT ARTERIAL BLOOD GAS, ED    EKG EKG Interpretation  Date/Time:  Sep 04, 2020 01:08:20 EST Ventricular Rate:  147 PR Interval:    QRS Duration: 105 QT Interval:  312 QTC Calculation: 488 R Axis:   33 Text Interpretation: Atrial fibrillation with rapid V-rate Ventricular premature complex Confirmed by Thayer Jew [phone]) on 2020/09/04 1:43:33 AM   Radiology DG Chest Port 1 View  Result Date: September 04, 2020 CLINICAL DATA:  Questionable sepsis EXAM: PORTABLE CHEST 1 VIEW COMPARISON:  04/07/2020 FINDINGS: Prior median sternotomy and valve replacement. Heart is normal size. Mild interstitial prominence few sleep throughout the right lung. Left basilar atelectasis or infiltrate. No effusions or acute bony abnormality. IMPRESSION: Interstitial prominence throughout the right lung with left base atelectasis or  infiltrate. Findings could  reflect atypical/viral pneumonia. Electronically Signed   By: Rolm Baptise M.D.   On: 08/31/2020 01:29    Procedures .Critical Care Performed by: Merryl Hacker, MD Authorized by: Merryl Hacker, MD   Critical care provider statement:    Critical care time (minutes):  75   Critical care was necessary to treat or prevent imminent or life-threatening deterioration of the following conditions:  Respiratory failure   Critical care was time spent personally by me on the following activities:  Discussions with consultants, evaluation of patient's response to treatment, examination of patient, ordering and performing treatments and interventions, ordering and review of laboratory studies, ordering and review of radiographic studies, pulse oximetry, re-evaluation of patient's condition, obtaining history from patient or surrogate and review of old charts     Medications Ordered in ED Medications  lactated ringers infusion ( Intravenous New Bag/Given 08/31/20 0210)  cefTRIAXone (ROCEPHIN) 2 g in sodium chloride 0.9 % 100 mL IVPB (0 g Intravenous Stopped 08/31/2020 0208)  azithromycin (ZITHROMAX) 500 mg in sodium chloride 0.9 % 250 mL IVPB (500 mg Intravenous New Bag/Given 31-Aug-2020 0210)  sodium chloride 0.9 % bolus 500 mL (has no administration in time range)  dexamethasone (DECADRON) injection 10 mg (has no administration in time range)  remdesivir 200 mg in sodium chloride 0.9% 250 mL IVPB (has no administration in time range)    Followed by  remdesivir 100 mg in sodium chloride 0.9 % 100 mL IVPB (has no administration in time range)  sodium chloride 0.9 % bolus 500 mL (500 mLs Intravenous New Bag/Given August 31, 2020 0117)  metroNIDAZOLE (FLAGYL) IVPB 500 mg (0 mg Intravenous Stopped 08/31/2020 0234)    ED Course  I have reviewed the triage vital signs and the nursing notes.  Pertinent labs & imaging results that were available during my care of the patient were reviewed by me and considered in my  medical decision making (see chart for details).  Clinical Course as of 08/31/2020 1478  Nancy Fetter 2020-08-31  0118 Attempted to contact patient's family.  No answer after calling multiple members in the chart.  Chart was reviewed.  Palliative consult has been placed but no definitive CODE STATUS noted [CH]  0133 Wife and son called back.  Discussed with inpatient status and presentation.  They report he began vomiting after lunch today.  He vomited multiple times after eating.  They did not note any respiratory distress at home.  We discussed advanced measures including intubation and CPR.  Per this discussion, they do not wish for him to be intubated or have CPR if his condition were to deteriorate.  It was agreed that he would be a DNR. [CH]  0250 I was called emergently to the patient's bedside.  Patient noted to have precipitous drop in O2 as he pulled his nonrebreather off his face.  Nonrebreather was replaced with O2 sats in the mid 68s.  Respiratory therapy was called to the bedside.  While he is not a candidate for BiPAP given his risk for further aspiration, he is notably bradycardic which is likely related to hypoxia.  Hypoxia multifactorial and likely related to COVID-19 and aspiration.  Will attempt BiPAP until further discussion with the patient's family. [CH]  2956 Patient son, Aaron Edelman, notified of precipitous decline.  While I was on the phone with the patient's son, patient noted to have significant bradycardia, apnea and required bagging.  Pulse very thready and weak.  I discussed with the patient's son  that I felt that he was imminently dying.  He stated understanding and stated that he would inform his mother.  Additionally, continues to confirm DNR.  No further assisted respirations attempted and BiPAP was not placed. [CH]  0304 Time of death 15.  Confirmed with bedside ultrasound.  No cardiac activity and asystole on the monitor. [CH]    Clinical Course User Index [CH] Dierdre Mccalip, Barbette Hair,  MD   MDM Rules/Calculators/A&P                          Patient presents in respiratory distress.  Recent history of vomiting and concern for aspiration.  Notably hypoxic requiring nonrebreather.  He is not a good candidate for BiPAP given his recent vomiting and mental status.  He is afebrile but ill-appearing.  Sepsis work-up was initiated given concern for hypoxia and aspiration.  He was given 500 cc of fluid.  Initially not hypotensive.  Noted to be in atrial fibrillation with RVR as well.  Initial work-up notable for leukocytosis to 18.  Patient was covered with Rocephin, azithromycin, and Flagyl for aspiration.  Lactate normal.  See clinical course above.  Patient DNR per discussion with family.  Unfortunately, he had a precipitous decline in clinical status with significant hypoxia and bradycardia likely related to hypoxic respiratory failure.  He was also noted to have positive testing for COVID-19.  Respiratory failure likely multifactorial.  Son was advised of decline and patient ultimately died.  Time of death 89.  Son and wife informed of death   Final Clinical Impression(s) / ED Diagnoses Final diagnoses:  Acute respiratory failure with hypoxia ([place])  Aspiration pneumonia of right lung, unspecified aspiration pneumonia type, unspecified part of lung ([place])  COVID-19    Rx / DC Orders ED Discharge Orders    None       Hisao Doo, Barbette Hair, MD 22-Aug-2020 [phone]

## 2020-09-09 NOTE — Progress Notes (Addendum)
Code Sepsis initiated @ 7494 AM, ELINK following.

## 2020-09-09 NOTE — ED Triage Notes (Signed)
Pt BIB [facility] from home, hx Alzheimer's. Family reports when pt laid flat tonight to go to bed, he vomited, and had increasing shortness of breath. On EMS arrival, pt tachycardic in the 140s.

## 2020-09-09 DEATH — deceased

## 2020-11-25 ENCOUNTER — Ambulatory Visit: Payer: Medicare Other | Admitting: Adult Health

## 2021-05-16 ENCOUNTER — Other Ambulatory Visit: Payer: Self-pay | Admitting: Nurse Practitioner

## 2021-05-16 DIAGNOSIS — I10 Essential (primary) hypertension: Secondary | ICD-10-CM
